# Patient Record
Sex: Female | Born: 1995 | Race: Black or African American | Hispanic: No | Marital: Single | Smoking: Never smoker
Health system: Southern US, Community
[De-identification: ages and names within clinical notes are randomized; demographics above are authoritative.]

## PROBLEM LIST (undated history)

## (undated) ENCOUNTER — Inpatient Hospital Stay (HOSPITAL_COMMUNITY): Payer: Self-pay | Attending: Obstetrics & Gynecology | Admitting: Obstetrics & Gynecology

## (undated) ENCOUNTER — Inpatient Hospital Stay (HOSPITAL_COMMUNITY): Payer: Self-pay

## (undated) ENCOUNTER — Emergency Department: Admission: EM | Disposition: A | Discharge status: Home / Self Care | Payer: Medicaid Other

## (undated) DIAGNOSIS — R519 Headache, unspecified: Secondary | ICD-10-CM

## (undated) DIAGNOSIS — J189 Pneumonia, unspecified organism: Secondary | ICD-10-CM

## (undated) DIAGNOSIS — F329 Major depressive disorder, single episode, unspecified: Secondary | ICD-10-CM

## (undated) DIAGNOSIS — F419 Anxiety disorder, unspecified: Secondary | ICD-10-CM

## (undated) DIAGNOSIS — M549 Dorsalgia, unspecified: Secondary | ICD-10-CM

## (undated) DIAGNOSIS — N390 Urinary tract infection, site not specified: Secondary | ICD-10-CM

## (undated) DIAGNOSIS — G43709 Chronic migraine without aura, not intractable, without status migrainosus: Secondary | ICD-10-CM

## (undated) DIAGNOSIS — O24419 Gestational diabetes mellitus in pregnancy, unspecified control: Secondary | ICD-10-CM

## (undated) DIAGNOSIS — A599 Trichomoniasis, unspecified: Secondary | ICD-10-CM

## (undated) DIAGNOSIS — K5900 Constipation, unspecified: Secondary | ICD-10-CM

## (undated) DIAGNOSIS — K219 Gastro-esophageal reflux disease without esophagitis: Secondary | ICD-10-CM

## (undated) DIAGNOSIS — D649 Anemia, unspecified: Secondary | ICD-10-CM

## (undated) DIAGNOSIS — R51 Headache: Secondary | ICD-10-CM

## (undated) DIAGNOSIS — E669 Obesity, unspecified: Secondary | ICD-10-CM

## (undated) DIAGNOSIS — G40909 Epilepsy, unspecified, not intractable, without status epilepticus: Secondary | ICD-10-CM

## (undated) DIAGNOSIS — F32A Depression, unspecified: Secondary | ICD-10-CM

## (undated) DIAGNOSIS — Z8489 Family history of other specified conditions: Secondary | ICD-10-CM

## (undated) DIAGNOSIS — R569 Unspecified convulsions: Secondary | ICD-10-CM

## (undated) DIAGNOSIS — F909 Attention-deficit hyperactivity disorder, unspecified type: Secondary | ICD-10-CM

## (undated) DIAGNOSIS — A549 Gonococcal infection, unspecified: Secondary | ICD-10-CM

## (undated) DIAGNOSIS — G40409 Other generalized epilepsy and epileptic syndromes, not intractable, without status epilepticus: Secondary | ICD-10-CM

## (undated) HISTORY — PX: ADENOIDECTOMY: SUR15

## (undated) HISTORY — DX: Epilepsy, unspecified, not intractable, without status epilepticus: G40.909

## (undated) HISTORY — DX: Constipation, unspecified: K59.00

## (undated) HISTORY — DX: Dorsalgia, unspecified: M54.9

## (undated) HISTORY — DX: Gestational diabetes mellitus in pregnancy, unspecified control: O24.419

## (undated) HISTORY — PX: EUSTACHIAN TUBE DILATION: SHX6770

## (undated) HISTORY — PX: TONSILLECTOMY: SUR1361

---
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
## 2014-01-06 ENCOUNTER — Ambulatory Visit: Payer: Self-pay

## 2014-01-14 ENCOUNTER — Encounter (HOSPITAL_COMMUNITY): Payer: Self-pay | Admitting: Emergency Medicine

## 2014-01-14 ENCOUNTER — Emergency Department (HOSPITAL_COMMUNITY)
Admission: EM | Admit: 2014-01-14 | Discharge: 2014-01-15 | Disposition: A | Discharge status: Home / Self Care | Payer: 59 | Attending: Emergency Medicine | Admitting: Emergency Medicine

## 2014-01-14 DIAGNOSIS — Z791 Long term (current) use of non-steroidal anti-inflammatories (NSAID): Secondary | ICD-10-CM | POA: Diagnosis not present

## 2014-01-14 DIAGNOSIS — Z87440 Personal history of urinary (tract) infections: Secondary | ICD-10-CM | POA: Insufficient documentation

## 2014-01-14 DIAGNOSIS — R51 Headache: Secondary | ICD-10-CM | POA: Insufficient documentation

## 2014-01-14 DIAGNOSIS — Z880 Allergy status to penicillin: Secondary | ICD-10-CM | POA: Diagnosis not present

## 2014-01-14 DIAGNOSIS — J45909 Unspecified asthma, uncomplicated: Secondary | ICD-10-CM | POA: Insufficient documentation

## 2014-01-14 DIAGNOSIS — Z3202 Encounter for pregnancy test, result negative: Secondary | ICD-10-CM | POA: Diagnosis not present

## 2014-01-14 DIAGNOSIS — R221 Localized swelling, mass and lump, neck: Secondary | ICD-10-CM | POA: Diagnosis not present

## 2014-01-14 DIAGNOSIS — R1030 Lower abdominal pain, unspecified: Secondary | ICD-10-CM | POA: Diagnosis not present

## 2014-01-14 DIAGNOSIS — D649 Anemia, unspecified: Secondary | ICD-10-CM | POA: Diagnosis not present

## 2014-01-14 DIAGNOSIS — R109 Unspecified abdominal pain: Secondary | ICD-10-CM

## 2014-01-14 DIAGNOSIS — Z8659 Personal history of other mental and behavioral disorders: Secondary | ICD-10-CM | POA: Insufficient documentation

## 2014-01-14 DIAGNOSIS — R110 Nausea: Secondary | ICD-10-CM

## 2014-01-14 DIAGNOSIS — E010 Iodine-deficiency related diffuse (endemic) goiter: Secondary | ICD-10-CM | POA: Insufficient documentation

## 2014-01-14 DIAGNOSIS — R42 Dizziness and giddiness: Secondary | ICD-10-CM | POA: Diagnosis not present

## 2014-01-14 DIAGNOSIS — G8929 Other chronic pain: Secondary | ICD-10-CM

## 2014-01-14 LAB — CBC WITH DIFFERENTIAL/PLATELET
BASOS ABS: 0 10*3/uL (ref 0.0–0.1)
BASOS PCT: 0 % (ref 0–1)
Eosinophils Absolute: 0.1 10*3/uL (ref 0.0–0.7)
Eosinophils Relative: 2 % (ref 0–5)
HEMATOCRIT: 35.6 % — AB (ref 36.0–46.0)
Hemoglobin: 11.1 g/dL — ABNORMAL LOW (ref 12.0–15.0)
Lymphocytes Relative: 37 % (ref 12–46)
Lymphs Abs: 2.3 10*3/uL (ref 0.7–4.0)
MCH: 25 pg — AB (ref 26.0–34.0)
MCHC: 31.2 g/dL (ref 30.0–36.0)
MCV: 80.2 fL (ref 78.0–100.0)
MONO ABS: 0.6 10*3/uL (ref 0.1–1.0)
Monocytes Relative: 9 % (ref 3–12)
Neutro Abs: 3.2 10*3/uL (ref 1.7–7.7)
Neutrophils Relative %: 52 % (ref 43–77)
PLATELETS: 341 10*3/uL (ref 150–400)
RBC: 4.44 MIL/uL (ref 3.87–5.11)
RDW: 15.1 % (ref 11.5–15.5)
WBC: 6.2 10*3/uL (ref 4.0–10.5)

## 2014-01-14 LAB — I-STAT TROPONIN, ED: Troponin i, poc: 0 ng/mL (ref 0.00–0.08)

## 2014-01-14 MED ORDER — ONDANSETRON HCL 4 MG/2ML IJ SOLN
4.0000 mg | Freq: Once | INTRAMUSCULAR | Status: AC
  Administered 2014-01-14: 4 mg via INTRAVENOUS
  Filled 2014-01-14: qty 2

## 2014-01-14 MED ORDER — SODIUM CHLORIDE 0.9 % IV BOLUS (SEPSIS)
1000.0000 mL | Freq: Once | INTRAVENOUS | Status: AC
  Administered 2014-01-14: 1000 mL via INTRAVENOUS

## 2014-01-14 MED ORDER — DIPHENHYDRAMINE HCL 50 MG/ML IJ SOLN
25.0000 mg | Freq: Once | INTRAMUSCULAR | Status: AC
  Administered 2014-01-14: 25 mg via INTRAVENOUS
  Filled 2014-01-14: qty 1

## 2014-01-14 NOTE — ED Notes (Signed)
PA-C at bedside 

## 2014-01-14 NOTE — ED Provider Notes (Signed)
CSN: [id number]     Arrival date & time 01/14/14  2154 History   First MD Initiated Contact with Patient 01/14/14 2234     Chief Complaint  Patient presents with  . Edema    The patient said she noticed a swelling in her neck and she did not think it was anything serious.  . Headache     (Consider location/radiation/quality/duration/timing/severity/associated sxs/prior Treatment) HPI Comments: Latoya Stark is a 19 y.o. female with a PMHx of asthma, ADHD, depression, seizures, chronic migraines, and anemia, who presents to the ED with complaints of 4 days of a headache which she describes as similar to her prior migraines, and also reports 2 weeks of "neck swelling" anteriorly which only hurts when she swallows or if it's palpated. She did not think this was serious, and has not had an evaluation for this neck swelling, and came today because of her ongoing headache. She states her head is 9/10 sharp throbbing frontal nonradiating constant pain worse with light and sound, and unrelieved with resting in a dark room, Tylenol, NSAIDs, and Excedrin. She states she has been evaluated for migraines by a neurologist in the past, but does not take any chronic medications to help prevent migraines. She states this headache was a gradual onset headache and is very similar to her prior headaches. Additionally she endorses associated lower abdominal/suprapubic abd cramping and intermittent nausea as well as lightheadedness with standing. As for her neck swelling, she states that when it is palpated it is very tender, and when she swallows it hurts, and she has a globus sensation and slight hoarseness in her voice that she has noticed over the last 2 weeks. Denies fevers, chills, sinus pressure/congestion, rhinorrhea, sore throat, ear pain/drainage, tinnitus, neck pain/stiffness, cough, palpitations, CP, SOB, vomiting, diarrhea, constipation, hematuria, dysuria, vaginal bleeding/discharge, myalgias, arthralgias,  syncope, vision changes, vertigo, focal weakness, numbness, paresthesias, or rashes. Has chronic unchanged L leg weakness which is unchanged. LMP was the beginning of 12/2013 therefore she states she should have had her menses now. +Sexually active. No recent sick contacts or travel.   Patient is a 19 y.o. female presenting with headaches. The history is provided by the patient. No language interpreter was used.  Headache Pain location:  Frontal Quality: throbbing. Radiates to:  Does not radiate Severity currently:  9/10 Severity at highest:  9/10 Onset quality:  Gradual Duration:  4 days Timing:  Constant Progression:  Unchanged Chronicity:  Recurrent Similar to prior headaches: yes   Context: bright light and loud noise   Relieved by:  Nothing Worsened by:  Light and sound Ineffective treatments:  Acetaminophen, NSAIDs and resting in a darkened room Associated symptoms: abdominal pain (suprapubic), nausea, photophobia and swollen glands (anterior neck swelling)   Associated symptoms: no back pain, no blurred vision, no congestion, no cough, no diarrhea, no dizziness, no drainage, no ear pain, no pain, no facial pain, no fatigue, no fever, no focal weakness, no hearing loss, no loss of balance, no myalgias, no near-syncope, no neck pain, no neck stiffness, no numbness, no paresthesias, no sinus pressure, no sore throat, no syncope, no tingling, no URI, no visual change, no vomiting and no weakness     Past Medical History  Diagnosis Date  . Asthma   . ADHD (attention deficit hyperactivity disorder)   . Depression   . Seizures   . Headache   . Anemia   . UTI (lower urinary tract infection)    Past Surgical History  Procedure  Laterality Date  . Orthopedic surgery    . Adenoidectomy    . Dilation and evacuation N/A 08/09/2013    Procedure: DILATATION AND EVACUATION;  Surgeon: Frederico Hamman, MD;  Location: [facility];  Service: Gynecology;  Laterality: N/A;   Family History    Problem Relation Age of Onset  . Seizures Mother   . Diabetes Maternal Grandmother   . Cancer Maternal Grandmother   . Hypertension Maternal Grandmother    History  Substance Use Topics  . Smoking status: Never Smoker   . Smokeless tobacco: Never Used  . Alcohol Use: No   OB History    Gravida Para Term Preterm AB TAB SAB Ectopic Multiple Living   1    1  1         Review of Systems  Constitutional: Negative for fever, chills and fatigue.  HENT: Positive for trouble swallowing (painful) and voice change (hoarseness). Negative for congestion, ear discharge, ear pain, hearing loss, postnasal drip, rhinorrhea, sinus pressure and sore throat.   Eyes: Positive for photophobia. Negative for blurred vision, pain, discharge and visual disturbance.  Respiratory: Negative for cough, shortness of breath and wheezing.   Cardiovascular: Negative for chest pain, palpitations, leg swelling, syncope and near-syncope.  Gastrointestinal: Positive for nausea and abdominal pain (suprapubic). Negative for vomiting, diarrhea, constipation and blood in stool.  Genitourinary: Positive for menstrual problem (missed period). Negative for dysuria, frequency, hematuria, vaginal bleeding and vaginal discharge.  Musculoskeletal: Negative for myalgias, back pain, arthralgias, neck pain and neck stiffness.  Skin: Negative for color change and rash.  Allergic/Immunologic: Negative for immunocompromised state.  Neurological: Positive for light-headedness (with standing) and headaches. Negative for dizziness, focal weakness, weakness, numbness, paresthesias and loss of balance.  Psychiatric/Behavioral: Negative for confusion.   10 Systems reviewed and are negative for acute change except as noted in the HPI.    Allergies  Penicillins and Sulfa antibiotics  Home Medications   Prior to Admission medications   Medication Sig Start Date End Date Taking? Authorizing Provider  fluconazole (DIFLUCAN) 150 MG tablet  Take 1 tablet (150 mg total) by mouth once. 12/27/13   Shelly Bombard, MD  ibuprofen (ADVIL,MOTRIN) 800 MG tablet Take 1 tablet (800 mg total) by mouth 3 (three) times daily. 10/05/13   Olegario Messier, NP  medroxyPROGESTERone (DEPO-PROVERA) 150 MG/ML injection Inject 1 mL (150 mg total) into the muscle every 3 (three) months. 12/23/13   Shelly Bombard, MD  metroNIDAZOLE (FLAGYL) 500 MG tablet Take 4 tablets (2,000 mg total) by mouth once. 12/27/13   Shelly Bombard, MD  Prenat-FeCbn-FeAspGl-FA-Omega (OB COMPLETE PETITE) 35-5-1-200 MG CAPS Take 1 capsule by mouth daily before breakfast. 12/23/13   Shelly Bombard, MD   BP 106/68    Pulse 99  Temp(Src) 98.3 F (36.8 C) (Oral)  Resp 18  Ht 5\' 3"  (1.6 m)  Wt 263 lb (119.296 kg)  BMI 46.60 kg/m2  SpO2 100%  LMP 12/05/2013 Physical Exam  Constitutional: She is oriented to person, place, and time. Vital signs are normal. She appears well-developed and well-nourished.  Non-toxic appearance. No distress.  Afebrile, nontoxic, NAD, VSS  HENT:  Head: Normocephalic and atraumatic.  Right Ear: Hearing, tympanic membrane, external ear and ear canal normal.  Left Ear: Hearing, tympanic membrane, external ear and ear canal normal.  Nose: Right sinus exhibits no maxillary sinus tenderness and no frontal sinus tenderness. Left sinus exhibits maxillary sinus tenderness and frontal sinus tenderness.  Mouth/Throat: Uvula is midline, oropharynx  is clear and moist and mucous membranes are normal. No trismus in the jaw. No uvula swelling.  [place]/AT Ears clear bilaterally Nose clear bilaterally, mild L sided maxillary and frontal sinus TTP Oropharynx clear, patent airway, uvula midline without deviation or swelling, no trismus or drooling, MMM  Eyes: Conjunctivae and EOM are normal. Pupils are equal, round, and reactive to light. Right eye exhibits no discharge. Left eye exhibits no discharge.  PERRL, EOMI without nystagmus  Neck: Normal range of motion and  phonation normal. Neck supple. Tracheal tenderness present. No spinous process tenderness and no muscular tenderness present. No rigidity. No tracheal deviation, no edema, no erythema and normal range of motion present. Thyroid mass present.    +Thyromegaly, L>R, which is TTP No stridor or tracheal deviation FROM intact without spinous process or paraspinous muscle TTP, no bony stepoffs or deformities, no muscle spasms. No rigidity or meningeal signs. No bruising or swelling. No meningismus  Cardiovascular: Normal rate, regular rhythm, normal heart sounds and intact distal pulses.  Exam reveals no gallop and no friction rub.   No murmur heard. RRR, nl s1/s2, no m/r/g, no pedal edema, distal pulses intact  Pulmonary/Chest: Effort normal and breath sounds normal. No stridor. No respiratory distress. She has no decreased breath sounds. She has no wheezes. She has no rhonchi. She has no rales.  Abdominal: Soft. Normal appearance and bowel sounds are normal. She exhibits no distension. There is tenderness in the suprapubic area. There is no rigidity, no rebound, no guarding, no CVA tenderness, no tenderness at McBurney's point and negative Murphy's sign.    Soft, obese which limits exam but nondistended, +BS throughout, with mild TTP in suprapubic area, no r/g/r, neg murphy's, neg mcburney's, no CVA TTP   Musculoskeletal: Normal range of motion.  MAE x4 Strength and sensation at baseline with chronic unchanged L leg weakness All spinal levels nonTTP without deformity or step offs Distal pulses intact, no pedal edema  Neurological: She is alert and oriented to person, place, and time. She has normal strength and normal reflexes. No cranial nerve deficit or sensory deficit. She displays a negative Romberg sign. Coordination and gait normal. GCS eye subscore is 4. GCS verbal subscore is 5. GCS motor subscore is 6.  CN 2-12 grossly intact A&O x4 GCS 15 DTRs symmetric Sensation and strength intact and  at baseline (LLE weakness chronic) Gait nonataxic including with tandem walking Coordination with finger-to-nose and heel-shin WNL Neg romberg, neg pronator drift   Skin: Skin is warm, dry and intact. No rash noted.  Psychiatric: She has a normal mood and affect.  Nursing note and vitals reviewed.   ED Course  Procedures (including critical care time) 23:00 Orthostatic Vital Signs BM  Orthostatic Lying  - BP- Lying: 106/68 mmHg ; Pulse- Lying: 98  Orthostatic Sitting - BP- Sitting: 132/55 mmHg ; Pulse- Sitting: 96  Orthostatic Standing at 0 minutes - BP- Standing at 0 minutes: 122/72 mmHg ; Pulse- Standing at 0 minutes: 106    Labs Review Labs Reviewed  CBC WITH DIFFERENTIAL - Abnormal; Notable for the following:    Hemoglobin 11.1 (*)    HCT 35.6 (*)    MCH 25.0 (*)    All other components within normal limits  COMPREHENSIVE METABOLIC PANEL - Abnormal; Notable for the following:    Glucose, Bld 126 (*)    Total Bilirubin 0.2 (*)    All other components within normal limits  LIPASE, BLOOD  TSH  URINALYSIS, ROUTINE W REFLEX MICROSCOPIC  T4, FREE  POC URINE PREG, ED  I-STAT TROPOININ, ED    Imaging Review No results found.   EKG Interpretation   Date/Time:  Wednesday January 14 2014 23:12:39 EST Ventricular Rate:  94 PR Interval:  171 QRS Duration: 78 QT Interval:  343 QTC Calculation: 429 R Axis:   76 Text Interpretation:  Sinus rhythm Consider left atrial enlargement ST  elev, probable normal early repol pattern No old tracing to compare  Confirmed by Debby Freiberg [phone]) on 01/14/2014 11:25:02 PM      MDM   Final diagnoses:  Chronic nonintractable headache, unspecified headache type  Orthostatic lightheadedness  Localized swelling, mass and lump, neck  Nausea  Abdominal cramping    19 y.o. female with acute on chronic headache, similar to prior HAs. Also having what appears to be thyroid mass vs LAD in anterior neck. Will obtain orthostatics, EKG,  trop, TSH/T4, CBC, CMP, lipase, U/A, upreg, and give benadryl, zofran, and fluids. Will proceed with toradol if pt is not pregnant. No BP noted at this time, will ensure this is recorded. Doubt need for abd imaging but will have more information once we can discern that pt isn't pregnant. Holding off on morphine or toradol until Upreg results. Encouraged pt to give specimen quickly. Will reassess shortly.  1:03 AM Pt delayed getting urine specimen to [place], but Upreg finally resulted with negative. After benadryl and fluids and zofran, nausea improved and HA just slightly improved. Will give toradol now. EKG: NSR with early repol unchanged from prior EKG. Orthostatic VS: from sitting to standing, DBP drop of 43mmHg therefore could be orthostasis. Fluids finished and BP stable. Trop neg. TSH WNL. CBC with baseline anemia. CMP with mild hyperglycemia. Lipase WNL. U/A and T4 pending. Will hand off care to [facility] PA-C to f/up with U/A and ensure headache improves with toradol. Discussed importance of f/up with PCP for anterior neck/thyroid swelling, but Dr. Colin Rhein saw pt with me and agreed that this is not emergently concerning or needing any emergent intervention. Will give list of providers for f/up. Please see Antonietta Breach' dictation for further documentation of care.  BP 123/58 mmHg  Pulse 99  Temp(Src) 98.3 F (36.8 C) (Oral)  Resp 26  Ht 5\' 3"  (1.6 m)  Wt 263 lb (119.296 kg)  BMI 46.60 kg/m2  SpO2 90%  LMP 12/05/2013  Meds ordered this encounter  Medications  . sodium chloride 0.9 % bolus 1,000 mL    Sig:   . ondansetron (ZOFRAN) injection 4 mg    Sig:   . diphenhydrAMINE (BENADRYL) injection 25 mg    Sig:   . ketorolac (TORADOL) 30 MG/ML injection 30 mg    Sig:   . naproxen (NAPROSYN) 500 MG tablet    Sig: Take 1 tablet (500 mg total) by mouth 2 (two) times daily as needed for mild pain, moderate pain or headache (TAKE WITH MEALS.).    Dispense:  20 tablet    Refill:  0    Order  Specific Question:  Supervising Provider    Answer:  Noemi Chapel D [[id number]]  . HYDROcodone-acetaminophen (NORCO) 5-325 MG per tablet    Sig: Take 1-2 tablets by mouth every 6 (six) hours as needed for severe pain.    Dispense:  6 tablet    Refill:  0    Order Specific Question:  Supervising Provider    Answer:  Noemi Chapel D [[id number]]  . ondansetron (ZOFRAN) 8 MG tablet    Sig: Take  1 tablet (8 mg total) by mouth every 8 (eight) hours as needed for nausea or vomiting.    Dispense:  10 tablet    Refill:  0    Order Specific Question:  Supervising Provider    Answer:  Johnna Acosta [address] [place], PA-C 01/15/14 1610  Debby Freiberg, MD 01/16/14 [phone]

## 2014-01-14 NOTE — ED Notes (Addendum)
The patient said she noticed a swelling in her neck and she did not think it was anything serious.  She says it does not hurt unless you touch it.  She also says it is hard for her to swallow.  She is also complaining of a headache she rates it 9/10.  She does have a history of migraines.

## 2014-01-15 DIAGNOSIS — R51 Headache: Secondary | ICD-10-CM | POA: Diagnosis not present

## 2014-01-15 LAB — URINALYSIS, ROUTINE W REFLEX MICROSCOPIC
BILIRUBIN URINE: NEGATIVE
Glucose, UA: NEGATIVE mg/dL
Hgb urine dipstick: NEGATIVE
Ketones, ur: NEGATIVE mg/dL
Leukocytes, UA: NEGATIVE
Nitrite: NEGATIVE
PROTEIN: NEGATIVE mg/dL
SPECIFIC GRAVITY, URINE: 1.017 (ref 1.005–1.030)
UROBILINOGEN UA: 0.2 mg/dL (ref 0.0–1.0)
pH: 7.5 (ref 5.0–8.0)

## 2014-01-15 LAB — COMPREHENSIVE METABOLIC PANEL
ALK PHOS: 86 U/L (ref 39–117)
ALT: 20 U/L (ref 0–35)
AST: 25 U/L (ref 0–37)
Albumin: 3.7 g/dL (ref 3.5–5.2)
Anion gap: 6 (ref 5–15)
BUN: 6 mg/dL (ref 6–23)
CALCIUM: 9.4 mg/dL (ref 8.4–10.5)
CO2: 28 mmol/L (ref 19–32)
Chloride: 105 mEq/L (ref 96–112)
Creatinine, Ser: 0.6 mg/dL (ref 0.50–1.10)
GFR calc non Af Amer: >90 mL/min (ref >90)
GLUCOSE: 126 mg/dL — AB (ref 70–99)
Potassium: 3.5 mmol/L (ref 3.5–5.1)
Sodium: 139 mmol/L (ref 135–145)
Total Bilirubin: 0.2 mg/dL — ABNORMAL LOW (ref 0.3–1.2)
Total Protein: 6.9 g/dL (ref 6.0–8.3)

## 2014-01-15 LAB — TSH: TSH: 3.188 u[IU]/mL (ref 0.350–4.500)

## 2014-01-15 LAB — T4, FREE: Free T4: 0.93 ng/dL (ref 0.80–1.80)

## 2014-01-15 LAB — LIPASE, BLOOD: Lipase: 30 U/L (ref 11–59)

## 2014-01-15 LAB — POC URINE PREG, ED: PREG TEST UR: NEGATIVE

## 2014-01-15 MED ORDER — HYDROCODONE-ACETAMINOPHEN 5-325 MG PO TABS: 1.0000 | ORAL_TABLET | Freq: Four times a day (QID) | ORAL | Status: DC | PRN

## 2014-01-15 MED ORDER — KETOROLAC TROMETHAMINE 30 MG/ML IJ SOLN
30.0000 mg | Freq: Once | INTRAMUSCULAR | Status: AC
  Administered 2014-01-15: 30 mg via INTRAVENOUS
  Filled 2014-01-15: qty 1

## 2014-01-15 MED ORDER — NAPROXEN 500 MG PO TABS: 500.0000 mg | ORAL_TABLET | Freq: Two times a day (BID) | ORAL | Status: DC | PRN

## 2014-01-15 MED ORDER — ONDANSETRON HCL 8 MG PO TABS: 8.0000 mg | ORAL_TABLET | Freq: Three times a day (TID) | ORAL | Status: DC | PRN

## 2014-01-15 NOTE — ED Provider Notes (Signed)
68 - Patient care assumed from [facility], PA-C at shift change. Patient with urinalysis pending. Also pending pain control of headache. Plan discussed with Camprubi-Soms, PA-C which includes discharge if headache improves. Urinalysis is not c/w infection. Patient rechecked; she endorses continued improvement of her headache. She states that she feels comfortable managing her symptoms further at home. Return precautions provided. Patient agreeable to plan with no unaddressed concerns. Patient discharged in good condition; VSS.  Filed Vitals:   01/14/14 2202 01/15/14 0035 01/15/14 0100 01/15/14 0130  BP:  123/58 118/91 110/79  Pulse: 99 99 91 77  Temp: 98.3 F (36.8 C)     TempSrc: Oral     Resp: 18 26 18 24   Height: 5\' 3"  (1.6 m)     Weight: 263 lb (119.296 kg)     SpO2: 100% 90% 100% 96%    Results for orders placed or performed during the hospital encounter of 01/14/14  Urinalysis, Routine w reflex microscopic  Result Value Ref Range   Color, Urine YELLOW YELLOW   APPearance HAZY (A) CLEAR   Specific Gravity, Urine 1.017 1.005 - 1.030   pH 7.5 5.0 - 8.0   Glucose, UA NEGATIVE NEGATIVE mg/dL   Hgb urine dipstick NEGATIVE NEGATIVE   Bilirubin Urine NEGATIVE NEGATIVE   Ketones, ur NEGATIVE NEGATIVE mg/dL   Protein, ur NEGATIVE NEGATIVE mg/dL   Urobilinogen, UA 0.2 0.0 - 1.0 mg/dL   Nitrite NEGATIVE NEGATIVE   Leukocytes, UA NEGATIVE NEGATIVE  CBC with Differential  Result Value Ref Range   WBC 6.2 4.0 - 10.5 K/uL   RBC 4.44 3.87 - 5.11 MIL/uL   Hemoglobin 11.1 (L) 12.0 - 15.0 g/dL   HCT 35.6 (L) 36.0 - 46.0 %   MCV 80.2 78.0 - 100.0 fL   MCH 25.0 (L) 26.0 - 34.0 pg   MCHC 31.2 30.0 - 36.0 g/dL   RDW 15.1 11.5 - 15.5 %   Platelets 341 150 - 400 K/uL   Neutrophils Relative % 52 43 - 77 %   Neutro Abs 3.2 1.7 - 7.7 K/uL   Lymphocytes Relative 37 12 - 46 %   Lymphs Abs 2.3 0.7 - 4.0 K/uL   Monocytes Relative 9 3 - 12 %   Monocytes Absolute 0.6 0.1 - 1.0 K/uL   Eosinophils Relative 2 0 - 5 %   Eosinophils Absolute 0.1 0.0 - 0.7 K/uL   Basophils Relative 0 0 - 1 %   Basophils Absolute 0.0 0.0 - 0.1 K/uL  Comprehensive metabolic panel  Result Value Ref Range   Sodium 139 135 - 145 mmol/L   Potassium 3.5 3.5 - 5.1 mmol/L   Chloride 105 96 - 112 mEq/L   CO2 28 19 - 32 mmol/L   Glucose, Bld 126 (H) 70 - 99 mg/dL   BUN 6 6 - 23 mg/dL   Creatinine, Ser 0.60 0.50 - 1.10 mg/dL   Calcium 9.4 8.4 - 10.5 mg/dL   Total Protein 6.9 6.0 - 8.3 g/dL   Albumin 3.7 3.5 - 5.2 g/dL   AST 25 0 - 37 U/L   ALT 20 0 - 35 U/L   Alkaline Phosphatase 86 39 - 117 U/L   Total Bilirubin 0.2 (L) 0.3 - 1.2 mg/dL   GFR calc non Af Amer >90 >90 mL/min   GFR calc Af Amer >90 >90 mL/min   Anion gap 6 5 - 15  Lipase, blood  Result Value Ref Range   Lipase 30 11 - 59 U/L  TSH  Result Value Ref Range   TSH 3.188 0.350 - 4.500 uIU/mL  POC urine preg, ED (not at [facility])  Result Value Ref Range   Preg Test, Ur NEGATIVE NEGATIVE  I-stat troponin, ED  Result Value Ref Range   Troponin i, poc 0.00 0.00 - 0.08 ng/mL   Comment 3            Antonietta Breach, PA-C 88/89/16 [id number]  Delora Fuel, MD 38/88/28 0034

## 2014-01-15 NOTE — Discharge Instructions (Signed)
The swelling in your neck needs to be reassessed by your regular doctor. If you don't have a regular doctor, use the list below to find one. For your headaches, use naprosyn or norco as directed as needed, but avoid driving while taking these. Use zofran as needed for nausea. Stay well hydrated. Follow up with your regular doctor or neurologist for ongoing headaches. Return to the ER for changes or worsening in symptoms. Abdominal (belly) pain can be caused by many things. Your caregiver performed an examination and possibly ordered blood/urine tests and imaging (CT scan, x-rays, ultrasound). Many cases can be observed and treated at home after initial evaluation in the emergency department. Even though you are being discharged home, abdominal pain can be unpredictable. Therefore, you need a repeated exam if your pain does not resolve, returns, or worsens. Most patients with abdominal pain don't have to be admitted to the hospital or have surgery, but serious problems like appendicitis and gallbladder attacks can start out as nonspecific pain. Many abdominal conditions cannot be diagnosed in one visit, so follow-up evaluations are very important. SEEK IMMEDIATE MEDICAL ATTENTION IF YOU DEVELOP ANY OF THE FOLLOWING SYMPTOMS:  The pain does not go away or becomes severe.   A temperature above 101 develops.   Repeated vomiting occurs (multiple episodes).   The pain becomes localized to portions of the abdomen. The right side could possibly be appendicitis. In an adult, the left lower portion of the abdomen could be colitis or diverticulitis.   Blood is being passed in stools or vomit (bright red or black tarry stools).   Return also if you develop chest pain, difficulty breathing, dizziness or fainting, or become confused, poorly responsive, or inconsolable (young children).  The constipation stays for more than 4 days.   There is belly (abdominal) or rectal pain.   You do not seem to be getting  better.     Migraine Headache A migraine headache is an intense, throbbing pain on one or both sides of your head. A migraine can last for 30 minutes to several hours. CAUSES  The exact cause of a migraine headache is not always known. However, a migraine may be caused when nerves in the brain become irritated and release chemicals that cause inflammation. This causes pain. Certain things may also trigger migraines, such as:  Alcohol.  Smoking.  Stress.  Menstruation.  Aged cheeses.  Foods or drinks that contain nitrates, glutamate, aspartame, or tyramine.  Lack of sleep.  Chocolate.  Caffeine.  Hunger.  Physical exertion.  Fatigue.  Medicines used to treat chest pain (nitroglycerine), birth control pills, estrogen, and some blood pressure medicines. SIGNS AND SYMPTOMS  Pain on one or both sides of your head.  Pulsating or throbbing pain.  Severe pain that prevents daily activities.  Pain that is aggravated by any physical activity.  Nausea, vomiting, or both.  Dizziness.  Pain with exposure to bright lights, loud noises, or activity.  General sensitivity to bright lights, loud noises, or smells. Before you get a migraine, you may get warning signs that a migraine is coming (aura). An aura may include:  Seeing flashing lights.  Seeing bright spots, halos, or zigzag lines.  Having tunnel vision or blurred vision.  Having feelings of numbness or tingling.  Having trouble talking.  Having muscle weakness. DIAGNOSIS  A migraine headache is often diagnosed based on:  Symptoms.  Physical exam.  A CT scan or MRI of your head. These imaging tests cannot diagnose migraines, but  they can help rule out other causes of headaches. TREATMENT Medicines may be given for pain and nausea. Medicines can also be given to help prevent recurrent migraines.  HOME CARE INSTRUCTIONS  Only take over-the-counter or prescription medicines for pain or discomfort as  directed by your health care provider. The use of long-term narcotics is not recommended.  Lie down in a dark, quiet room when you have a migraine.  Keep a journal to find out what may trigger your migraine headaches. For example, write down:  What you eat and drink.  How much sleep you get.  Any change to your diet or medicines.  Limit alcohol consumption.  Quit smoking if you smoke.  Get 7-9 hours of sleep, or as recommended by your health care provider.  Limit stress.  Keep lights dim if bright lights bother you and make your migraines worse. SEEK IMMEDIATE MEDICAL CARE IF:   Your migraine becomes severe.  You have a fever.  You have a stiff neck.  You have vision loss.  You have muscular weakness or loss of muscle control.  You start losing your balance or have trouble walking.  You feel faint or pass out.  You have severe symptoms that are different from your first symptoms. MAKE SURE YOU:   Understand these instructions.  Will watch your condition.  Will get help right away if you are not doing well or get worse. Document Released: 12/26/2004 Document Revised: 05/12/2013 Document Reviewed: 09/02/2012 [facility] Patient Information 2015 [place], [place]. This information is not intended to replace advice given to you by your health care provider. Make sure you discuss any questions you have with your health care provider.  Nausea, Adult Nausea is the feeling that you have an upset stomach or have to vomit. Nausea by itself is not likely a serious concern, but it may be an early sign of more serious medical problems. As nausea gets worse, it can lead to vomiting. If vomiting develops, there is the risk of dehydration.  CAUSES   Viral infections.  Food poisoning.  Medicines.  Pregnancy.  Motion sickness.  Migraine headaches.  Emotional distress.  Severe pain from any source.  Alcohol intoxication. HOME CARE INSTRUCTIONS  Get plenty of  rest.  Ask your caregiver about specific rehydration instructions.  Eat small amounts of food and sip liquids more often.  Take all medicines as told by your caregiver. SEEK MEDICAL CARE IF:  You have not improved after 2 days, or you get worse.  You have a headache. SEEK IMMEDIATE MEDICAL CARE IF:   You have a fever.  You faint.  You keep vomiting or have blood in your vomit.  You are extremely weak or dehydrated.  You have dark or bloody stools.  You have severe chest or abdominal pain. MAKE SURE YOU:  Understand these instructions.  Will watch your condition.  Will get help right away if you are not doing well or get worse. Document Released: 02/03/2004 Document Revised: 09/20/2011 Document Reviewed: 09/07/2010 [facility] Patient Information 2015 [place], [place]. This information is not intended to replace advice given to you by your health care provider. Make sure you discuss any questions you have with your health care provider. Emergency Department Resource Guide 1) Find a Doctor and Pay Out of Pocket Although you won't have to find out who is covered by your insurance plan, it is a good idea to ask around and get recommendations. You will then need to call the office and see if the  doctor you have chosen will accept you as a new patient and what types of options they offer for patients who are self-pay. Some doctors offer discounts or will set up payment plans for their patients who do not have insurance, but you will need to ask so you aren't surprised when you get to your appointment.  2) Contact Your Local Health Department Not all health departments have doctors that can see patients for sick visits, but many do, so it is worth a call to see if yours does. If you don't know where your local health department is, you can check in your phone book. The CDC also has a tool to help you locate your state's health department, and many state websites also have listings of all  of their local health departments.  3) Find a [facility] If your illness is not likely to be very severe or complicated, you may want to try a walk in clinic. These are popping up all over the country in pharmacies, drugstores, and shopping centers. They're usually staffed by nurse practitioners or physician assistants that have been trained to treat common illnesses and complaints. They're usually fairly quick and inexpensive. However, if you have serious medical issues or chronic medical problems, these are probably not your best option.  No Primary Care Doctor: - Call Health Connect at  [phone] - they can help you locate a primary care doctor that  accepts your insurance, provides certain services, etc. - Physician Referral Service- [phone]  Chronic Pain Problems: Organization         Address  Phone   Notes  [facility]  [phone] Patients need to be referred by their primary care doctor.   Medication Assistance: Organization         Address  Phone   Notes  [facility] Medication [facility] [address] [place], [place] [address] [phone] --Must be a resident of [facility] -- Must have NO insurance coverage whatsoever (no Medicaid/ Medicare, etc.) -- The pt. MUST have a primary care doctor that directs their care regularly and follows them in the community   [facility]  [phone]   [employer]  [phone]    Agencies that provide inexpensive medical care: Organization         Address  Phone   Notes  [place]  [phone]   [facility]    [phone]   [facility] [place],  [address] [phone]   [facility] [address] [place] [phone]   [facility]    [phone]   [facility]    [phone]   [place] and [place] [address],  [place] Phone:  [phone], Fax:  [phone] Hours of Operation:  9 am - 6 pm, M-F.  Also accepts Medicaid/Medicare and self-pay.  [facility] for [place] [address], [address], [place] Phone: [phone], Fax: [phone]. Hours of Operation:  8:30 am - 5:30 pm, M-F.  Also accepts Medicaid and self-pay.  [facility] [place] [address], [place] Phone: [phone]   [place], [place], [place] [phone], Ext. [phone] Mondays & Thursdays: 7-9 AM.  First 15 patients are seen on a first come, first serve basis.    [place] Providers:  Organization  Address  Phone   Notes  [facility] [address], [place] [phone] Also accepts self-pay patients.  [facility] [address], [place]  [phone]   [place], [address], [place] [phone]   [facility] [address], [place] [phone]   Lucianne Lei [address], [place]   [phone] Only accepts [place] patients after they have their name applied to their card.   Self-Pay (no insurance) in [facility]:  Organization         Address  Phone   Notes  Sickle Cell Patients, [facility] [place] [phone]   [facility] [place] [phone]   [facility] [place]  [place], [place], [place] [phone]   [facility]/Dr. Osei-Bonsu  [address], [place] or [address], [place] [phone] Phone number for both [place] and [place] locations is the same.  Urgent Medical and [facility] [address] [place] [phone]   [facility] [address] [place] or [address] [phone]   [facility] [address] [place] [phone], phone; [phone], fax Sees patients 1st and 3rd Saturday of every month.  Must not qualify for public or private insurance (i.e. Medicaid, Medicare, [place] Health Choice, Veterans' Benefits)  Household income should be no more than 200% of the poverty level The clinic cannot treat you if you are pregnant or think you are pregnant  Sexually transmitted diseases are not treated at the clinic.

## 2014-02-15 ENCOUNTER — Encounter (HOSPITAL_COMMUNITY): Payer: Self-pay | Admitting: Family Medicine

## 2014-02-15 ENCOUNTER — Emergency Department (HOSPITAL_COMMUNITY)
Admission: EM | Admit: 2014-02-15 | Discharge: 2014-02-15 | Disposition: A | Discharge status: Home / Self Care | Payer: 59 | Attending: Emergency Medicine | Admitting: Emergency Medicine

## 2014-02-15 DIAGNOSIS — Z8669 Personal history of other diseases of the nervous system and sense organs: Secondary | ICD-10-CM | POA: Insufficient documentation

## 2014-02-15 DIAGNOSIS — E860 Dehydration: Secondary | ICD-10-CM | POA: Diagnosis not present

## 2014-02-15 DIAGNOSIS — Z79899 Other long term (current) drug therapy: Secondary | ICD-10-CM | POA: Insufficient documentation

## 2014-02-15 DIAGNOSIS — Z87440 Personal history of urinary (tract) infections: Secondary | ICD-10-CM | POA: Diagnosis not present

## 2014-02-15 DIAGNOSIS — J45909 Unspecified asthma, uncomplicated: Secondary | ICD-10-CM | POA: Insufficient documentation

## 2014-02-15 DIAGNOSIS — Z3202 Encounter for pregnancy test, result negative: Secondary | ICD-10-CM | POA: Diagnosis not present

## 2014-02-15 DIAGNOSIS — Z880 Allergy status to penicillin: Secondary | ICD-10-CM | POA: Insufficient documentation

## 2014-02-15 DIAGNOSIS — R339 Retention of urine, unspecified: Secondary | ICD-10-CM | POA: Diagnosis present

## 2014-02-15 DIAGNOSIS — Z862 Personal history of diseases of the blood and blood-forming organs and certain disorders involving the immune mechanism: Secondary | ICD-10-CM | POA: Diagnosis not present

## 2014-02-15 DIAGNOSIS — Z791 Long term (current) use of non-steroidal anti-inflammatories (NSAID): Secondary | ICD-10-CM | POA: Insufficient documentation

## 2014-02-15 DIAGNOSIS — Z8659 Personal history of other mental and behavioral disorders: Secondary | ICD-10-CM | POA: Insufficient documentation

## 2014-02-15 LAB — URINALYSIS, ROUTINE W REFLEX MICROSCOPIC
Bilirubin Urine: NEGATIVE
Glucose, UA: NEGATIVE mg/dL
Ketones, ur: NEGATIVE mg/dL
Leukocytes, UA: NEGATIVE
Nitrite: NEGATIVE
PROTEIN: NEGATIVE mg/dL
Specific Gravity, Urine: 1.025 (ref 1.005–1.030)
Urobilinogen, UA: 0.2 mg/dL (ref 0.0–1.0)
pH: 6.5 (ref 5.0–8.0)

## 2014-02-15 LAB — CBC WITH DIFFERENTIAL/PLATELET
BASOS ABS: 0 10*3/uL (ref 0.0–0.1)
BASOS PCT: 0 % (ref 0–1)
Eosinophils Absolute: 0.1 10*3/uL (ref 0.0–0.7)
Eosinophils Relative: 3 % (ref 0–5)
HEMATOCRIT: 35.6 % — AB (ref 36.0–46.0)
Hemoglobin: 11.4 g/dL — ABNORMAL LOW (ref 12.0–15.0)
Lymphocytes Relative: 34 % (ref 12–46)
Lymphs Abs: 1.6 10*3/uL (ref 0.7–4.0)
MCH: 26.2 pg (ref 26.0–34.0)
MCHC: 32 g/dL (ref 30.0–36.0)
MCV: 81.8 fL (ref 78.0–100.0)
MONOS PCT: 8 % (ref 3–12)
Monocytes Absolute: 0.4 10*3/uL (ref 0.1–1.0)
NEUTROS PCT: 55 % (ref 43–77)
Neutro Abs: 2.6 10*3/uL (ref 1.7–7.7)
Platelets: 338 10*3/uL (ref 150–400)
RBC: 4.35 MIL/uL (ref 3.87–5.11)
RDW: 14.8 % (ref 11.5–15.5)
WBC: 4.8 10*3/uL (ref 4.0–10.5)

## 2014-02-15 LAB — BASIC METABOLIC PANEL
ANION GAP: 6 (ref 5–15)
BUN: 10 mg/dL (ref 6–23)
CHLORIDE: 104 mmol/L (ref 96–112)
CO2: 28 mmol/L (ref 19–32)
Calcium: 8.9 mg/dL (ref 8.4–10.5)
Creatinine, Ser: 0.97 mg/dL (ref 0.50–1.10)
GFR calc Af Amer: >90 mL/min (ref >90)
GFR calc non Af Amer: 84 mL/min — ABNORMAL LOW (ref >90)
GLUCOSE: 91 mg/dL (ref 70–99)
POTASSIUM: 3.8 mmol/L (ref 3.5–5.1)
Sodium: 138 mmol/L (ref 135–145)

## 2014-02-15 LAB — URINE MICROSCOPIC-ADD ON

## 2014-02-15 LAB — POC URINE PREG, ED: PREG TEST UR: NEGATIVE

## 2014-02-15 NOTE — Discharge Instructions (Signed)
Rehydration, Adult Rehydration is the replacement of body fluids lost during dehydration. Dehydration is an extreme loss of body fluids to the point of body function impairment. There are many ways extreme fluid loss can occur, including vomiting, diarrhea, or excess sweating. Recovering from dehydration requires replacing lost fluids, continuing to eat to maintain strength, and avoiding foods and beverages that may contribute to further fluid loss or may increase nausea. HOW TO REHYDRATE In most cases, rehydration involves the replacement of not only fluids but also carbohydrates and basic body salts. Rehydration with an oral rehydration solution is one way to replace essential nutrients lost through dehydration. An oral rehydration solution can be purchased at pharmacies, retail stores, and online. Premixed packets of powder that you combine with water to make a solution are also sold. You can prepare an oral rehydration solution at home by mixing the following ingredients together:    - tsp table salt.   tsp baking soda.   tsp salt substitute containing potassium chloride.  1 tablespoons sugar.  1 L (34 oz) of water. Be sure to use exact measurements. Including too much sugar can make diarrhea worse. Drink -1 cup (120-240 mL) of oral rehydration solution each time you have diarrhea or vomit. If drinking this amount makes your vomiting worse, try drinking smaller amounts more often. For example, drink 1-3 tsp every 5-10 minutes.  A general rule for staying hydrated is to drink 1-2 L of fluid per day. Talk to your caregiver about the specific amount you should be drinking each day. Drink enough fluids to keep your urine clear or pale yellow. EATING WHEN DEHYDRATED Even if you have had severe sweating or you are having diarrhea, do not stop eating. Many healthy items in a normal diet are okay to continue eating while recovering from dehydration. The following tips can help you to lessen nausea  when you eat:  Ask someone else to prepare your food. Cooking smells may worsen nausea.  Eat in a well-ventilated room away from cooking smells.  Sit up when you eat. Avoid lying down until 1-2 hours after eating.  Eat small amounts when you eat.  Eat foods that are easy to digest. These include soft, well-cooked, or mashed foods. FOODS AND BEVERAGES TO AVOID Avoid eating or drinking the following foods and beverages that may increase nausea or further loss of fluid:   Fruit juices with a high sugar content, such as concentrated juices.  Alcohol.  Beverages containing caffeine.  Carbonated drinks. They may cause a lot of gas.  Foods that may cause a lot of gas, such as cabbage, broccoli, and beans.  Fatty, greasy, and fried foods.  Spicy, very salty, and very sweet foods or drinks.  Foods or drinks that are very hot or very cold. Consume food or drinks at or near room temperature.  Foods that need a lot of chewing, such as raw vegetables.  Foods that are sticky or hard to swallow, such as peanut butter. Document Released: 03/20/2011 Document Revised: 09/20/2011 Document Reviewed: 03/20/2011 [facility] Patient Information 2015 [place], [place]. This information is not intended to replace advice given to you by your health care provider. Make sure you discuss any questions you have with your health care provider.

## 2014-02-15 NOTE — ED Notes (Signed)
Per pt sts that she has been unable to void. sts last time she voided was last night around 7 pm. sts she has been drinking a lot of fluids. Denies burning or pain.

## 2014-02-15 NOTE — ED Provider Notes (Signed)
CSN: [id number]     Arrival date & time 02/15/14  3893 History   First MD Initiated Contact with Patient 02/15/14 1011     Chief Complaint  Patient presents with  . Urinary Retention     (Consider location/radiation/quality/duration/timing/severity/associated sxs/prior Treatment) HPI   PCP: Raechel Ache, MD Blood pressure 93/59, pulse 89, temperature 97.4 F (36.3 C), temperature source Oral, resp. rate 16, last menstrual period 02/11/2014, SpO2 99 %.  Latoya Stark is a 19 y.o.female with a significant PMH of depression, asthma, seizures, headache, anemia, UTI  presents to the ER with complaints of  Urinary retention and being unable to void since last night around 7 pm. She reports drinking significant amount of water and has still not needed to urine. On arrival the nurse reports bladder scanner shows 160 ml of urine in bladder. The patient was then able to clean cath provide urine sample in the ED. She endorses some suprapubic discomfort. No nausea, vomiting, diarrhea, weakness, fevers.   Past Medical History  Diagnosis Date  . Asthma   . ADHD (attention deficit hyperactivity disorder)   . Depression   . Seizures   . Headache   . Anemia   . UTI (lower urinary tract infection)    Past Surgical History  Procedure Laterality Date  . Orthopedic surgery    . Adenoidectomy    . Dilation and evacuation N/A 08/09/2013    Procedure: DILATATION AND EVACUATION;  Surgeon: Frederico Hamman, MD;  Location: [facility];  Service: Gynecology;  Laterality: N/A;   Family History  Problem Relation Age of Onset  . Seizures Mother   . Diabetes Maternal Grandmother   . Cancer Maternal Grandmother   . Hypertension Maternal Grandmother    History  Substance Use Topics  . Smoking status: Never Smoker   . Smokeless tobacco: Never Used  . Alcohol Use: No   OB History    Gravida Para Term Preterm AB TAB SAB Ectopic Multiple Living   1    1  1         Review of Systems  10 Systems  reviewed and are negative for acute change except as noted in the HPI.    Allergies  Penicillins; Cheese; and Sulfa antibiotics  Home Medications   Prior to Admission medications   Medication Sig Start Date End Date Taking? Authorizing Provider  HYDROcodone-acetaminophen (NORCO) 5-325 MG per tablet Take 1-2 tablets by mouth every 6 (six) hours as needed for severe pain. 01/15/14  Yes Mercedes Strupp Camprubi-Soms, PA-C  ibuprofen (ADVIL,MOTRIN) 800 MG tablet Take 1 tablet (800 mg total) by mouth 3 (three) times daily. 10/05/13  Yes Olegario Messier, NP  naproxen (NAPROSYN) 500 MG tablet Take 1 tablet (500 mg total) by mouth 2 (two) times daily as needed for mild pain, moderate pain or headache (TAKE WITH MEALS.). 01/15/14  Yes Mercedes Strupp Camprubi-Soms, PA-C  Prenat-FeCbn-FeAspGl-FA-Omega (OB COMPLETE PETITE) 35-5-1-200 MG CAPS Take 1 capsule by mouth daily before breakfast. 12/23/13  Yes Shelly Bombard, MD  fluconazole (DIFLUCAN) 150 MG tablet Take 1 tablet (150 mg total) by mouth once. Patient not taking: Reported on 01/14/2014 12/27/13   Shelly Bombard, MD  medroxyPROGESTERone (DEPO-PROVERA) 150 MG/ML injection Inject 1 mL (150 mg total) into the muscle every 3 (three) months. Patient not taking: Reported on 01/14/2014 12/23/13   Shelly Bombard, MD  metroNIDAZOLE (FLAGYL) 500 MG tablet Take 4 tablets (2,000 mg total) by mouth once. Patient not taking: Reported on 01/14/2014 12/27/13  Shelly Bombard, MD  ondansetron (ZOFRAN) 8 MG tablet Take 1 tablet (8 mg total) by mouth every 8 (eight) hours as needed for nausea or vomiting. 01/15/14   Mercedes Strupp Camprubi-Soms, PA-C   BP 93/59 mmHg  Pulse 89  Temp(Src) 97.4 F (36.3 C) (Oral)  Resp 16  SpO2 99%  LMP 02/11/2014 Physical Exam  Constitutional: She appears well-developed and well-nourished. No distress.  HENT:  Head: Normocephalic and atraumatic.  Eyes: Pupils are equal, round, and reactive to light.  Neck: Normal range of  motion. Neck supple.  Cardiovascular: Normal rate and regular rhythm.   Pulmonary/Chest: Effort normal.  Abdominal: Soft. There is tenderness.  Neurological: She is alert.  Skin: Skin is warm and dry.  Nursing note and vitals reviewed.   ED Course  Procedures (including critical care time) Labs Review Labs Reviewed  URINALYSIS, ROUTINE W REFLEX MICROSCOPIC - Abnormal; Notable for the following:    APPearance CLOUDY (*)    Hgb urine dipstick MODERATE (*)    All other components within normal limits  CBC WITH DIFFERENTIAL/PLATELET - Abnormal; Notable for the following:    Hemoglobin 11.4 (*)    HCT 35.6 (*)    All other components within normal limits  BASIC METABOLIC PANEL - Abnormal; Notable for the following:    GFR calc non Af Amer 84 (*)    All other components within normal limits  URINE MICROSCOPIC-ADD ON - Abnormal; Notable for the following:    Squamous Epithelial / LPF FEW (*)    Bacteria, UA FEW (*)    All other components within normal limits  POC URINE PREG, ED    Imaging Review No results found.   EKG Interpretation None      MDM   Final diagnoses:  Dehydration   Normal CBC and BMP, urinalysis and urine preg are reassuring- hemoblogin in urine as pt just completed a menstrual cycle.  19 y.o.Latoya Stark's evaluation in the Emergency Department is complete. It has been determined that no acute conditions requiring further emergency intervention are present at this time. The patient/guardian have been advised of the diagnosis and plan. We have discussed signs and symptoms that warrant return to the ED, such as changes or worsening in symptoms.  Vital signs are stable at discharge. Filed Vitals:   02/15/14 1115  BP: 129/77  Pulse: 80  Temp:   Resp:     Patient/guardian has voiced understanding and agreed to follow-up with the PCP or specialist.     Linus Mako, PA-C 02/15/14 [place], MD 02/15/14 [phone]

## 2014-02-24 ENCOUNTER — Telehealth: Payer: Self-pay | Admitting: *Deleted

## 2014-02-24 ENCOUNTER — Ambulatory Visit (INDEPENDENT_AMBULATORY_CARE_PROVIDER_SITE_OTHER): Payer: Self-pay | Admitting: Internal Medicine

## 2014-02-24 ENCOUNTER — Encounter: Payer: Self-pay | Admitting: Internal Medicine

## 2014-02-24 ENCOUNTER — Other Ambulatory Visit (INDEPENDENT_AMBULATORY_CARE_PROVIDER_SITE_OTHER): Payer: Medicaid Other

## 2014-02-24 VITALS — BP 82/60 | HR 88 | Ht 64.5 in | Wt 271.2 lb

## 2014-02-24 DIAGNOSIS — K5901 Slow transit constipation: Secondary | ICD-10-CM

## 2014-02-24 DIAGNOSIS — R1084 Generalized abdominal pain: Secondary | ICD-10-CM | POA: Diagnosis not present

## 2014-02-24 DIAGNOSIS — K50919 Crohn's disease, unspecified, with unspecified complications: Secondary | ICD-10-CM | POA: Diagnosis not present

## 2014-02-24 DIAGNOSIS — K5900 Constipation, unspecified: Secondary | ICD-10-CM | POA: Insufficient documentation

## 2014-02-24 DIAGNOSIS — K5909 Other constipation: Secondary | ICD-10-CM | POA: Insufficient documentation

## 2014-02-24 LAB — IBC PANEL
Iron: 115 ug/dL (ref 42–145)
SATURATION RATIOS: 24.5 % (ref 20.0–50.0)
TRANSFERRIN: 335 mg/dL (ref 212.0–360.0)

## 2014-02-24 LAB — TSH: TSH: 2.06 u[IU]/mL (ref 0.40–5.00)

## 2014-02-24 LAB — SEDIMENTATION RATE: SED RATE: 10 mm/h (ref 0–22)

## 2014-02-24 LAB — VITAMIN B12: Vitamin B-12: 309 pg/mL (ref 211–911)

## 2014-02-24 LAB — HCG, QUANTITATIVE, PREGNANCY: QUANTITATIVE HCG: 0 m[IU]/mL

## 2014-02-24 MED ORDER — LINACLOTIDE 145 MCG PO CAPS: 145.0000 ug | ORAL_CAPSULE | Freq: Every day | ORAL | Status: DC

## 2014-02-24 NOTE — Telephone Encounter (Signed)
Unable to reach patient at either number listed. Mail box is full.

## 2014-02-24 NOTE — Telephone Encounter (Signed)
Patient came for OV.

## 2014-02-24 NOTE — Progress Notes (Signed)
Latoya Stark 03-07-1995 [id number]  Note: This dictation was prepared with Dragon digital system. Any transcriptional errors that result from this procedure are unintentional.   History of Present Illness: This is a 19 year old African-American female with  severe chronic constipation. She reports having bowel movements only when she has menses once a month .She denies any rectal bleeding but  has occasional lower abdominal pain for which she has been evaluated in the emergency room multiple times. There is no family history of inflammatory bowel disease. Her mother and her brother both have similar problems. She has  poor eating habits skipping breakfast and lunch then having snacks in the afternoon and one meal at home in the evening. She sometimes eats late at night at 10 PM and goes to bed at midnight. She often has to get up at 4 in the morning to go to work. Her weight has been excessive but stable. She has history of mild anemia. She has history of depression ,asthma, seizures and urinary tract infections. She has tried her brother's MiraLAX but did not like it. She has taken prune juice. She takes Naprosyn, Zofran and birth control pill . She had a D&C in July 2015 for early pregnancy    Past Medical History  Diagnosis Date  . Asthma   . ADHD (attention deficit hyperactivity disorder)   . Depression   . Seizures   . Headache   . Anemia   . UTI (lower urinary tract infection)     Past Surgical History  Procedure Laterality Date  . Orthopedic surgery Left     left hand  . Adenoidectomy    . Dilation and evacuation N/A 08/09/2013    Procedure: DILATATION AND EVACUATION;  Surgeon: Frederico Hamman, MD;  Location: [facility];  Service: Gynecology;  Laterality: N/A;    Allergies  Allergen Reactions  . Milk-Related Compounds Anaphylaxis    Breaks face out  . Penicillins Anaphylaxis    Throat swells.   [name] Other (See Comments)    Breaks face out  . Sulfa Antibiotics Swelling     Family history and social history have been reviewed.  Review of Systems: Positive for constipation. Negative for nausea vomiting heartburn  The remainder of the 10 point ROS is negative except as outlined in the H&P  Physical Exam: General Appearance Well developed, in no distress, obese Eyes  Non icteric  HEENT  Non traumatic, normocephalic  Mouth No lesion, tongue papillated, no cheilosis Neck Supple without adenopathy, thyroid not enlarged, no carotid bruits, no JVD Lungs Clear to auscultation bilaterally COR Normal S1, normal S2, regular rhythm, no murmur, quiet precordium Abdomen protuberant. Soft. Normoactive bowel sounds. Minimal tenderness in left lower quadrant. Rectal normal rectal sphincter tone. Small amount of Hemoccult negative stool in the ampulla. No perianal disease Extremities  No pedal edema Skin No lesions Neurological Alert and oriented x 3 Psychological Normal mood and affect, cooperative and pleasant  Assessment and Plan:   19 year old African-American female with  severe constipation without actually having any discomfort in her abdomen. She thinks that her bowel habits are normal for her while her family urges her to go to the doctor to be evaluated. She has tried some laxatives without success. She has very poor eating habits not conducive to normal bowel function. We will start Linzess 145 ug daily and milk of magnesia 15 cc every day when she comes back from work. We will give her high fiber diet. We will proceed with CT  scan of the abdomen and pelvis with IV and oral contrast to rule out partial small bowel obstruction, megacolon or Crohn's disease. She may need Sitzmarks study to assess transit time. and colonoscopy, eventually. But first  she needs to  improve her eating habits and increase the fiber intake. We will be checking a sedimentation rate, sprue profile, TSH and IgA     Delfin Edis 02/24/2014

## 2014-02-24 NOTE — Telephone Encounter (Signed)
-----   Message from Lafayette Dragon, MD sent at 02/23/2014  5:13 PM EST ----- Hello Rollene Fare, this  19 yo lady is a high risk for No show- ED referral, frequent flier, depression, head aches, on Norco. Please call is she is planning to come.Risa Grill

## 2014-02-24 NOTE — Patient Instructions (Addendum)
Your physician has requested that you go to the basement for the following lab work before leaving today: B12, iron studies, sed rate, TSH, sprue profile, PG test   Today you have been given a handout to read on constipation.  We have sent the following medications to your pharmacy for you to pick up at your convenience: Linzess , coupon provided  Take over the counter milk of magnesia 2 tablespoons daily at 4:00pm.   You have been scheduled for a CT scan of the abdomen and pelvis at [facility] ([address]---this is in the same building as [facility]).   You are scheduled on 03/06/14 at 3:30. You should arrive 15 minutes prior to your appointment time for registration. Please follow the written instructions below on the day of your exam:  WARNING: IF YOU ARE ALLERGIC TO IODINE/X-RAY DYE, PLEASE NOTIFY RADIOLOGY IMMEDIATELY AT [phone]! YOU WILL BE GIVEN A 13 HOUR PREMEDICATION PREP.  1) Do not eat or drink anything after 11:30 (4 hours prior to your test) 2) You have been given 2 bottles of oral contrast to drink. The solution may taste better if refrigerated, but do NOT add ice or any other liquid to this solution. Shake well before drinking.    Drink 1 bottle of contrast @ 1:30 (2 hours prior to your exam)  Drink 1 bottle of contrast @ 2:30 (1 hour prior to your exam)  You may take any medications as prescribed with a small amount of water except for the following: Metformin, Glucophage, Glucovance, Avandamet, Riomet, Fortamet, Actoplus Met, Janumet, Glumetza or Metaglip. The above medications must be held the day of the exam AND 48 hours after the exam.  The purpose of you drinking the oral contrast is to aid in the visualization of your intestinal tract. The contrast solution may cause some diarrhea. Before your exam is started, you will be given a small amount of fluid to drink. Depending on your individual set of symptoms, you may also receive an  intravenous injection of x-ray contrast/dye. Plan on being at [facility] for 30 minutes or long, depending on the type of exam you are having performed.  This test typically takes 30-45 minutes to complete.  If you have any questions regarding your exam or if you need to reschedule, you may call the CT department at [phone] between the hours of 8:00 am and 5:00 pm, Monday-Friday.  ________________________________________________________________________  I appreciate the opportunity to care for you. Dr Baltazar Najjar

## 2014-02-25 LAB — GLIA (IGA/G) + TTG IGA
GLIADIN IGG: 3 U (ref <20)
Gliadin IgA: 6 Units (ref <20)
Tissue Transglutaminase Ab, IgA: 1 U/mL (ref <4)

## 2014-02-27 ENCOUNTER — Encounter: Payer: Self-pay | Admitting: *Deleted

## 2014-03-06 ENCOUNTER — Inpatient Hospital Stay: Admission: RE | Admit: 2014-03-06 | Payer: 59 | Source: Ambulatory Visit

## 2014-03-09 ENCOUNTER — Telehealth: Payer: Self-pay | Admitting: Internal Medicine

## 2014-03-09 ENCOUNTER — Inpatient Hospital Stay: Admission: RE | Admit: 2014-03-09 | Payer: 59 | Source: Ambulatory Visit

## 2014-03-09 ENCOUNTER — Telehealth: Payer: Self-pay | Admitting: *Deleted

## 2014-03-09 MED ORDER — ONDANSETRON HCL 4 MG PO TABS: ORAL_TABLET | ORAL | Status: DC

## 2014-03-09 NOTE — Telephone Encounter (Signed)
Patient states she drank CT contrast on Friday but was unable to have the CT. She is scheduled for CT again today. She reports she has been vomiting since Friday. She is asking for something for nausea and vomiting. Dr. Olevia Perches is off today. DOD - Dr. Ardis Hughs. Please, advise.

## 2014-03-09 NOTE — Telephone Encounter (Signed)
Received a call from [facility]. Patient called them Friday and told them she had car trouble. She rescheduled for today. She called them today and told them she was having nausea and vomiting. Patient did get rx for Zofran. [facility] called patient when she did not show up this afternoon and she is not returning their calls.

## 2014-03-09 NOTE — Telephone Encounter (Signed)
Patient states she drank CT contrast on Friday but was unable to have CT scan. She is scheduled for CT again today. She states she has been vomiting since Friday when she drank the contrast. She is asking for something for nausea, vomiting.

## 2014-03-09 NOTE — Telephone Encounter (Signed)
Ok, zofran 4mg  pill, take one pill twice daily for nausea.  Disp 60 with no refills.  Thanks

## 2014-03-09 NOTE — Telephone Encounter (Signed)
Spoke with patient gave her recommendation. Rx sent to pharmacy.

## 2014-03-10 ENCOUNTER — Other Ambulatory Visit: Payer: Self-pay | Admitting: *Deleted

## 2014-03-10 DIAGNOSIS — K5901 Slow transit constipation: Secondary | ICD-10-CM

## 2014-03-10 NOTE — Telephone Encounter (Signed)
May be she is afraid of the cost. Another option is to do a [employer] study for colonic transit time. Call her to schedule in place of CT sczan.

## 2014-03-10 NOTE — Telephone Encounter (Signed)
Spoke with patient and she states she will do a sitz marker study. She will stop her Linzess tomorrow and Thursday. She will come on Friday to take the pill. She will have the xray on 03/17/14.

## 2014-03-16 ENCOUNTER — Telehealth: Payer: Self-pay | Admitting: *Deleted

## 2014-03-16 NOTE — Telephone Encounter (Signed)
Home number not in service and cell number mail box is full. Will try calling patient again later because she has not had her xray today.

## 2014-03-17 NOTE — Telephone Encounter (Signed)
Mail box is full unable to reach patient.

## 2014-03-18 NOTE — Telephone Encounter (Signed)
Patient came on 03/12/14 and swallowed the sitz marker capsule. She was to come back for xray on 03/17/14. She did not show. I have been unable to reach patient at any of the number in her chart.

## 2014-03-18 NOTE — Telephone Encounter (Signed)
Sorry to hear that. She did not show up for CT scan either.If we cannot reach her, she will have to call us if she needs [place].

## 2014-03-22 ENCOUNTER — Inpatient Hospital Stay (HOSPITAL_COMMUNITY)
Admission: AD | Admit: 2014-03-22 | Discharge: 2014-03-22 | Disposition: A | Discharge status: Home / Self Care | Payer: 59 | Source: Ambulatory Visit | Attending: Obstetrics | Admitting: Obstetrics

## 2014-03-22 ENCOUNTER — Encounter (HOSPITAL_COMMUNITY): Payer: Self-pay

## 2014-03-22 DIAGNOSIS — K5909 Other constipation: Secondary | ICD-10-CM

## 2014-03-22 DIAGNOSIS — N939 Abnormal uterine and vaginal bleeding, unspecified: Secondary | ICD-10-CM | POA: Diagnosis present

## 2014-03-22 DIAGNOSIS — R110 Nausea: Secondary | ICD-10-CM | POA: Diagnosis not present

## 2014-03-22 DIAGNOSIS — K5900 Constipation, unspecified: Secondary | ICD-10-CM | POA: Diagnosis not present

## 2014-03-22 DIAGNOSIS — N920 Excessive and frequent menstruation with regular cycle: Secondary | ICD-10-CM | POA: Diagnosis not present

## 2014-03-22 HISTORY — DX: Anxiety disorder, unspecified: F41.9

## 2014-03-22 LAB — CBC
HCT: 35.1 % — ABNORMAL LOW (ref 36.0–46.0)
HEMOGLOBIN: 11 g/dL — AB (ref 12.0–15.0)
MCH: 26.3 pg (ref 26.0–34.0)
MCHC: 31.3 g/dL (ref 30.0–36.0)
MCV: 83.8 fL (ref 78.0–100.0)
Platelets: 350 10*3/uL (ref 150–400)
RBC: 4.19 MIL/uL (ref 3.87–5.11)
RDW: 14.1 % (ref 11.5–15.5)
WBC: 4.9 10*3/uL (ref 4.0–10.5)

## 2014-03-22 LAB — COMPREHENSIVE METABOLIC PANEL
ALT: 18 U/L (ref 0–35)
AST: 22 U/L (ref 0–37)
Albumin: 3.8 g/dL (ref 3.5–5.2)
Alkaline Phosphatase: 81 U/L (ref 39–117)
Anion gap: 7 (ref 5–15)
BUN: 11 mg/dL (ref 6–23)
CO2: 28 mmol/L (ref 19–32)
Calcium: 9.2 mg/dL (ref 8.4–10.5)
Chloride: 104 mmol/L (ref 96–112)
Creatinine, Ser: 0.66 mg/dL (ref 0.50–1.10)
GFR calc non Af Amer: >90 mL/min (ref >90)
Glucose, Bld: 109 mg/dL — ABNORMAL HIGH (ref 70–99)
POTASSIUM: 4 mmol/L (ref 3.5–5.1)
Sodium: 139 mmol/L (ref 135–145)
Total Bilirubin: 0.2 mg/dL — ABNORMAL LOW (ref 0.3–1.2)
Total Protein: 7.1 g/dL (ref 6.0–8.3)

## 2014-03-22 LAB — URINE MICROSCOPIC-ADD ON

## 2014-03-22 LAB — URINALYSIS, ROUTINE W REFLEX MICROSCOPIC
BILIRUBIN URINE: NEGATIVE
Glucose, UA: NEGATIVE mg/dL
Ketones, ur: NEGATIVE mg/dL
Leukocytes, UA: NEGATIVE
Nitrite: NEGATIVE
Protein, ur: NEGATIVE mg/dL
Specific Gravity, Urine: 1.025 (ref 1.005–1.030)
UROBILINOGEN UA: 0.2 mg/dL (ref 0.0–1.0)
pH: 6.5 (ref 5.0–8.0)

## 2014-03-22 LAB — WET PREP, GENITAL
TRICH WET PREP: NONE SEEN
YEAST WET PREP: NONE SEEN

## 2014-03-22 LAB — POCT PREGNANCY, URINE: PREG TEST UR: NEGATIVE

## 2014-03-22 MED ORDER — FAMOTIDINE 20 MG PO TABS
20.0000 mg | ORAL_TABLET | Freq: Once | ORAL | Status: AC
  Administered 2014-03-22: 20 mg via ORAL
  Filled 2014-03-22: qty 1

## 2014-03-22 MED ORDER — PROMETHAZINE HCL 25 MG PO TABS
25.0000 mg | ORAL_TABLET | Freq: Once | ORAL | Status: AC
  Administered 2014-03-22: 25 mg via ORAL
  Filled 2014-03-22: qty 1

## 2014-03-22 MED ORDER — PROMETHAZINE HCL 25 MG PO TABS: 12.5000 mg | ORAL_TABLET | Freq: Four times a day (QID) | ORAL | Status: DC | PRN

## 2014-03-22 MED ORDER — MEDROXYPROGESTERONE ACETATE 150 MG/ML IM SUSP
150.0000 mg | Freq: Once | INTRAMUSCULAR | Status: AC
  Administered 2014-03-22: 150 mg via INTRAMUSCULAR
  Filled 2014-03-22: qty 1

## 2014-03-22 NOTE — Discharge Instructions (Signed)
Menorrhagia  Menorrhagia is the medical term for when your menstrual periods are heavy or last longer than usual. With menorrhagia, every period you have may cause enough blood loss and cramping that you are unable to maintain your usual activities.  CAUSES   In some cases, the cause of heavy periods is unknown, but a number of conditions may cause menorrhagia. Common causes include:   A problem with the hormone-producing thyroid gland (hypothyroid).   Noncancerous growths in the uterus (polyps or fibroids).   An imbalance of the estrogen and progesterone hormones.   One of your ovaries not releasing an egg during one or more months.   Side effects of having an intrauterine device (IUD).   Side effects of some medicines, such as anti-inflammatory medicines or blood thinners.   A bleeding disorder that stops your blood from clotting normally.  SIGNS AND SYMPTOMS   During a normal period, bleeding lasts between 4 and 8 days. Signs that your periods are too heavy include:   You routinely have to change your pad or tampon every 1 or 2 hours because it is completely soaked.   You pass blood clots larger than 1 inch (2.5 cm) in size.   You have bleeding for more than 7 days.   You need to use pads and tampons at the same time because of heavy bleeding.   You need to wake up to change your pads or tampons during the night.   You have symptoms of anemia, such as tiredness, fatigue, or shortness of breath.  DIAGNOSIS   Your health care provider will perform a physical exam and ask you questions about your symptoms and menstrual history. Other tests may be ordered based on what the health care provider finds during the exam. These tests can include:   Blood tests. Blood tests are used to check if you are pregnant or have hormonal changes, a bleeding or thyroid disorder, low iron levels (anemia), or other problems.   Endometrial biopsy. Your health care provider takes a sample of tissue from the inside of your  uterus to be examined under a microscope.   Pelvic ultrasound. This test uses sound waves to make a picture of your uterus, ovaries, and vagina. The pictures can show if you have fibroids or other growths.   Hysteroscopy. For this test, your health care provider will use a small telescope to look inside your uterus.  Based on the results of your initial tests, your health care provider may recommend further testing.  TREATMENT   Treatment may not be needed. If it is needed, your health care provider may recommend treatment with one or more medicines first. If these do not reduce bleeding enough, a surgical treatment might be an option. The best treatment for you will depend on:    Whether you need to prevent pregnancy.   Your desire to have children in the future.   The cause and severity of your bleeding.   Your opinion and personal preference.   Medicines for menorrhagia may include:   Birth control methods that use hormones. These include the pill, skin patch, vaginal ring, shots that you get every 3 months, hormonal IUD, and implant. These treatments reduce bleeding during your menstrual period.   Medicines that thicken blood and slow bleeding.   Medicines that reduce swelling, such as ibuprofen.   Medicines that contain a synthetic hormone called progestin.    Medicines that make the ovaries stop working for a short time.     the lining of your uterus to reduce menstrual bleeding.  Operative hysteroscopy. This procedure uses a tiny tube with a light (hysteroscope) to view your uterine cavity and can help in the surgical removal of a polyp that may be causing heavy periods.  Endometrial ablation. Through various techniques, your health care  provider permanently destroys the entire lining of your uterus (endometrium). After endometrial ablation, most women have little or no menstrual flow. Endometrial ablation reduces your ability to become pregnant.  Endometrial resection. This surgical procedure uses an electrosurgical wire loop to remove the lining of the uterus. This procedure also reduces your ability to become pregnant.  Hysterectomy. Surgical removal of the uterus and cervix is a permanent procedure that stops menstrual periods. Pregnancy is not possible after a hysterectomy. This procedure requires anesthesia and hospitalization. HOME CARE INSTRUCTIONS   Only take over-the-counter or prescription medicines as directed by your health care provider. Take prescribed medicines exactly as directed. Do not change or switch medicines without consulting your health care provider.  Take any prescribed iron pills exactly as directed by your health care provider. Long-term heavy bleeding may result in low iron levels. Iron pills help replace the iron your body lost from heavy bleeding. Iron may cause constipation. If this becomes a problem, increase the bran, fruits, and roughage in your diet.  Do not take aspirin or medicines that contain aspirin 1 week before or during your menstrual period. Aspirin may make the bleeding worse.  If you need to change your sanitary pad or tampon more than once every 2 hours, stay in bed and rest as much as possible until the bleeding stops.  Eat well-balanced meals. Eat foods high in iron. Examples are leafy green vegetables, meat, liver, eggs, and whole grain breads and cereals. Do not try to lose weight until the abnormal bleeding has stopped and your blood iron level is back to normal. SEEK MEDICAL CARE IF:   You soak through a pad or tampon every 1 or 2 hours, and this happens every time you have a period.  You need to use pads and tampons at the same time because you are bleeding so much.  You  need to change your pad or tampon during the night.  You have a period that lasts for more than 8 days.  You pass clots bigger than 1 inch wide.  You have irregular periods that happen more or less often than once a month.  You feel dizzy or faint.  You feel very weak or tired.  You feel short of breath or feel your heart is beating too fast when you exercise.  You have nausea and vomiting or diarrhea while you are taking your medicine.  You have any problems that may be related to the medicine you are taking. SEEK IMMEDIATE MEDICAL CARE IF:   You soak through 4 or more pads or tampons in 2 hours.  You have any bleeding while you are pregnant. MAKE SURE YOU:   Understand these instructions.  Will watch your condition.  Will get help right away if you are not doing well or get worse. Document Released: 12/26/2004 Document Revised: 12/31/2012 Document Reviewed: 06/16/2012 [facility] Patient Information 2015 [place], [place]. This information is not intended to replace advice given to you by your health care provider. Make sure you discuss any questions you have with your health care provider.  High-Fiber Diet Fiber is found in fruits, vegetables, and grains. A high-fiber diet encourages the addition of more whole grains,  legumes, fruits, and vegetables in your diet. The recommended amount of fiber for adult males is 38 g per day. For adult females, it is 25 g per day. Pregnant and lactating women should get 28 g of fiber per day. If you have a digestive or bowel problem, ask your caregiver for advice before adding high-fiber foods to your diet. Eat a variety of high-fiber foods instead of only a select few type of foods.  PURPOSE  To increase stool bulk.  To make bowel movements more regular to prevent constipation.  To lower cholesterol.  To prevent overeating. WHEN IS THIS DIET USED?  It may be used if you have constipation and hemorrhoids.  It may be used if you have  uncomplicated diverticulosis (intestine condition) and irritable bowel syndrome.  It may be used if you need help with weight management.  It may be used if you want to add it to your diet as a protective measure against atherosclerosis, diabetes, and cancer. SOURCES OF FIBER  Whole-grain breads and cereals.  Fruits, such as apples, oranges, bananas, berries, prunes, and pears.  Vegetables, such as green peas, carrots, sweet potatoes, beets, broccoli, cabbage, spinach, and artichokes.  Legumes, such split peas, soy, lentils.  Almonds. FIBER CONTENT IN FOODS Starches and Grains / Dietary Fiber (g)  Raisin Bran, 1 cup / 7g  Cheerios, 1 cup / 3 g  Corn Flakes cereal, 1 cup / 0.7 g  Rice crispy treat cereal, 1 cup / 0.3 g  Instant oatmeal (cooked),  cup / 2 g  Frosted wheat cereal, 1 cup / 5.1 g  Brown, long-grain rice (cooked), 1 cup / 3.5 g  White, long-grain rice (cooked), 1 cup / 0.6 g  Enriched macaroni (cooked), 1 cup / 2.5 g Legumes / Dietary Fiber (g)  Baked beans (canned, plain, or vegetarian),  cup / 5.2 g  Kidney beans (canned),  cup / 6.8 g  Pinto beans (cooked),  cup / 5.5 g Breads and Crackers / Dietary Fiber (g)  Plain or honey graham crackers, 2 squares / 0.7 g  Saltine crackers, 3 squares / 0.3 g  Plain, salted pretzels, 10 pieces / 1.8 g  Whole-wheat bread, 1 slice / 1.9 g  White bread, 1 slice / 0.7 g  Raisin bread, 1 slice / 1.2 g  Plain bagel, 3 oz / 2 g  Flour tortilla, 1 oz / 0.9 g  Corn tortilla, 1 small / 1.5 g  Hamburger or hotdog bun, 1 small / 0.9 g Fruits / Dietary Fiber (g)  Apple with skin, 1 medium / 4.4 g  Sweetened applesauce,  cup / 1.5 g  Banana,  medium / 1.5 g  Grapes, 10 grapes / 0.4 g  Orange, 1 small / 2.3 g  Raisin, 1.5 oz / 1.6 g  Melon, 1 cup / 1.4 g Vegetables / Dietary Fiber (g)  Green beans (canned),  cup / 1.3 g  Carrots (cooked),  cup / 2.3 g  Broccoli (cooked),  cup / 2.8  g  Peas (cooked),  cup / 4.4 g  Mashed potatoes,  cup / 1.6 g  Lettuce, 1 cup / 0.5 g  Corn (canned),  cup / 1.6 g  Tomato,  cup / 1.1 g Document Released: 12/26/2004 Document Revised: 06/27/2011 Document Reviewed: 03/30/2011 [employer] Patient Information 2015 [place], [place]. This information is not intended to replace advice given to you by your health care provider. Make sure you discuss any questions you have with your health care  provider.

## 2014-03-22 NOTE — MAU Provider Note (Signed)
Chief Complaint: Vaginal Bleeding   First Provider Initiated Contact with Patient 03/22/14 1240      SUBJECTIVE HPI: Latoya Stark is a 19 y.o. G1P0010 who presents to maternity admissions reporting vaginal bleeding x 1 month, reported as light some days, heavier other days.  She also reports daily nausea, with occasional vomiting x 2 weeks.  She had recent work up by GI for constipation/nausea and has missed appointments for follow up.  She initially denies constipation but then reports she has not had bowel movement in 1.5 weeks.   She was prescribed high fiber diet but has not made any changes to her current diet.  She has mostly regular periods, and reports she has never had bleeding last this long.  She sees Dr Jodi Mourning for gyn care and reports she has cancelled recent appts with him due to family/home issues and she has not gotten her Depo Provera injection.  She denies abdominal pain, vaginal itching/burning, urinary symptoms, h/a, dizziness, n/v, or fever/chills.    Past Medical History  Diagnosis Date  . Asthma   . ADHD (attention deficit hyperactivity disorder)   . Depression   . Seizures   . Headache   . Anemia   . UTI (lower urinary tract infection)   . Anxiety    Past Surgical History  Procedure Laterality Date  . Orthopedic surgery Left     left hand  . Adenoidectomy    . Dilation and evacuation N/A 08/09/2013    Procedure: DILATATION AND EVACUATION;  Surgeon: Frederico Hamman, MD;  Location: [facility];  Service: Gynecology;  Laterality: N/A;   History   Social History  . Marital Status: Single    Spouse Name: N/A  . Number of Children: 0  . Years of Education: N/A   Occupational History  . Not on file.   Social History Main Topics  . Smoking status: Never Smoker   . Smokeless tobacco: Never Used  . Alcohol Use: No  . Drug Use: No  . Sexual Activity: Yes    Birth Control/ Protection: Condom     Comment: last intercourse Nov 6th; new sex partner over the past  week   Other Topics Concern  . Not on file   Social History Narrative   No current facility-administered medications on file prior to encounter.   Current Outpatient Prescriptions on File Prior to Encounter  Medication Sig Dispense Refill  . Linaclotide (LINZESS) 145 MCG CAPS capsule Take 1 capsule (145 mcg total) by mouth daily. 30 capsule 3  . medroxyPROGESTERone (DEPO-PROVERA) 150 MG/ML injection Inject 1 mL (150 mg total) into the muscle every 3 (three) months. (Patient not taking: Reported on 01/14/2014) 1 mL 0  . naproxen (NAPROSYN) 500 MG tablet Take 1 tablet (500 mg total) by mouth 2 (two) times daily as needed for mild pain, moderate pain or headache (TAKE WITH MEALS.). (Patient not taking: Reported on 02/24/2014) 20 tablet 0  . ondansetron (ZOFRAN) 4 MG tablet Take one po BID for nausea 60 tablet 0  . ondansetron (ZOFRAN) 8 MG tablet Take 1 tablet (8 mg total) by mouth every 8 (eight) hours as needed for nausea or vomiting. (Patient not taking: Reported on 02/24/2014) 10 tablet 0  . Prenat-FeCbn-FeAspGl-FA-Omega (OB COMPLETE PETITE) 35-5-1-200 MG CAPS Take 1 capsule by mouth daily before breakfast. 30 capsule 11   Allergies  Allergen Reactions  . Milk-Related Compounds Anaphylaxis    Breaks face out  . Penicillins Anaphylaxis    Throat swells.   Marland Kitchen  Cheese Other (See Comments)    Breaks face out  . Sulfa Antibiotics Swelling    ROS: Pertinent items in HPI  OBJECTIVE Blood pressure 114/66, pulse 94, temperature 98.4 F (36.9 C), temperature source Oral, resp. rate 16, height 5\' 3"  (1.6 m), weight 212 lb (96.163 kg), last menstrual period 02/24/2014. GENERAL: Well-developed, well-nourished female in no acute distress.  HEENT: Normocephalic HEART: normal rate RESP: normal effort ABDOMEN: Soft, non-tender EXTREMITIES: Nontender, no edema NEURO: Alert and oriented Pelvic exam: Cervix pink, visually closed, without lesion, moderate amount dark red bleeding, vaginal walls and  external genitalia normal Bimanual exam: Cervix 0/long/high, firm, anterior, neg CMT, uterus nontender, nonenlarged, adnexa without tenderness, enlargement, or mass  LAB RESULTS Results for orders placed or performed during the hospital encounter of 03/22/14 (from the past 24 hour(s))  Urinalysis, Routine w reflex microscopic     Status: Abnormal   Collection Time: 03/22/14 12:00 PM  Result Value Ref Range   Color, Urine YELLOW YELLOW   APPearance CLEAR CLEAR   Specific Gravity, Urine 1.025 1.005 - 1.030   pH 6.5 5.0 - 8.0   Glucose, UA NEGATIVE NEGATIVE mg/dL   Hgb urine dipstick LARGE (A) NEGATIVE   Bilirubin Urine NEGATIVE NEGATIVE   Ketones, ur NEGATIVE NEGATIVE mg/dL   Protein, ur NEGATIVE NEGATIVE mg/dL   Urobilinogen, UA 0.2 0.0 - 1.0 mg/dL   Nitrite NEGATIVE NEGATIVE   Leukocytes, UA NEGATIVE NEGATIVE  Urine microscopic-add on     Status: Abnormal   Collection Time: 03/22/14 12:00 PM  Result Value Ref Range   Squamous Epithelial / LPF FEW (A) RARE   WBC, UA 0-2 <3 WBC/hpf   RBC / HPF 7-10 <3 RBC/hpf  Pregnancy, urine POC     Status: None   Collection Time: 03/22/14 12:25 PM  Result Value Ref Range   Preg Test, Ur NEGATIVE NEGATIVE  Wet prep, genital     Status: Abnormal   Collection Time: 03/22/14 12:45 PM  Result Value Ref Range   Yeast Wet Prep HPF POC NONE SEEN NONE SEEN   Trich, Wet Prep NONE SEEN NONE SEEN   Clue Cells Wet Prep HPF POC FEW (A) NONE SEEN   WBC, Wet Prep HPF POC FEW (A) NONE SEEN  CBC     Status: Abnormal   Collection Time: 03/22/14 12:58 PM  Result Value Ref Range   WBC 4.9 4.0 - 10.5 K/uL   RBC 4.19 3.87 - 5.11 MIL/uL   Hemoglobin 11.0 (L) 12.0 - 15.0 g/dL   HCT 35.1 (L) 36.0 - 46.0 %   MCV 83.8 78.0 - 100.0 fL   MCH 26.3 26.0 - 34.0 pg   MCHC 31.3 30.0 - 36.0 g/dL   RDW 14.1 11.5 - 15.5 %   Platelets 350 150 - 400 K/uL  Comprehensive metabolic panel     Status: Abnormal   Collection Time: 03/22/14 12:58 PM  Result Value Ref Range    Sodium 139 135 - 145 mmol/L   Potassium 4.0 3.5 - 5.1 mmol/L   Chloride 104 96 - 112 mmol/L   CO2 28 19 - 32 mmol/L   Glucose, Bld 109 (H) 70 - 99 mg/dL   BUN 11 6 - 23 mg/dL   Creatinine, Ser 0.66 0.50 - 1.10 mg/dL   Calcium 9.2 8.4 - 10.5 mg/dL   Total Protein 7.1 6.0 - 8.3 g/dL   Albumin 3.8 3.5 - 5.2 g/dL   AST 22 0 - 37 U/L   ALT 18 0 -  35 U/L   Alkaline Phosphatase 81 39 - 117 U/L   Total Bilirubin 0.2 (L) 0.3 - 1.2 mg/dL   GFR calc non Af Amer >90 >90 mL/min   GFR calc Af Amer >90 >90 mL/min   Anion gap 7 5 - 15    ASSESSMENT 1. Menorrhagia with regular cycle   2. Chronic constipation   3. Nausea without vomiting     PLAN Phenergan 25 mg PO and Pepcid 20 mg PO given in MAU Depo Provera injection given today in MAU Discharge home F/U with GI for constipation and nausea Reviewed high fiber diet with pt F/U with Dr Jodi Mourning for Gyn care and contraception Return to MAU as needed    Medication List    STOP taking these medications        naproxen 500 MG tablet  Commonly known as:  NAPROSYN     ondansetron 4 MG tablet  Commonly known as:  ZOFRAN     ondansetron 8 MG tablet  Commonly known as:  ZOFRAN      TAKE these medications        Linaclotide 145 MCG Caps capsule  Commonly known as:  LINZESS  Take 1 capsule (145 mcg total) by mouth daily.     medroxyPROGESTERone 150 MG/ML injection  Commonly known as:  DEPO-PROVERA  Inject 1 mL (150 mg total) into the muscle every 3 (three) months.     OB COMPLETE PETITE 35-5-1-200 MG Caps  Take 1 capsule by mouth daily before breakfast.     promethazine 25 MG tablet  Commonly known as:  PHENERGAN  Take 0.5-1 tablets (12.5-25 mg total) by mouth every 6 (six) hours as needed.       Follow-up Information    Please follow up.   Why:  Gastroenterology for nausea/constipation      Follow up with HARPER,CHARLES A, MD.   Specialty:  Obstetrics and Gynecology   Why:  For Gyn care and contraception, As needed    Contact information:   [address] [place] [address] [phone]       Follow up with [facility].   Why:  As needed for emergencies   Contact information:   [address] [id number] [place] Certified Nurse-Midwife 03/22/2014  2:10 PM

## 2014-03-22 NOTE — MAU Note (Signed)
Pt states here for vaginal bleeding x1 month. Denies hx of ovarian cysts or fibroids. Goes from super light to super heavy. Has changed pad twice today. No abnormal vaginal discharge.

## 2014-03-23 LAB — HIV ANTIBODY (ROUTINE TESTING W REFLEX): HIV SCREEN 4TH GENERATION: NONREACTIVE

## 2014-03-23 LAB — GC/CHLAMYDIA PROBE AMP (~~LOC~~) NOT AT ARMC
CHLAMYDIA, DNA PROBE: NEGATIVE
NEISSERIA GONORRHEA: NEGATIVE

## 2014-03-30 ENCOUNTER — Telehealth: Payer: Self-pay | Admitting: *Deleted

## 2014-03-30 NOTE — Telephone Encounter (Signed)
Patient called stating that she was seen at the ED for white substance leaking from breast. Patient states UPT was preformed at the hospital and the results were negative. Patient states she has taken 2 UPT's since then and the results were positive. Patient state she is still having the leakage along with nausea and fatigue. Patient schedule for a problem visit tomorrow morning at 11:45 with a UPT.

## 2014-03-31 ENCOUNTER — Ambulatory Visit: Payer: 59 | Admitting: Obstetrics

## 2014-04-25 ENCOUNTER — Encounter (HOSPITAL_COMMUNITY): Payer: Self-pay | Admitting: *Deleted

## 2014-04-25 ENCOUNTER — Inpatient Hospital Stay (HOSPITAL_COMMUNITY)
Admission: AD | Admit: 2014-04-25 | Discharge: 2014-04-25 | Disposition: A | Discharge status: Home / Self Care | Payer: 59 | Source: Ambulatory Visit | Attending: Obstetrics | Admitting: Obstetrics

## 2014-04-25 DIAGNOSIS — R109 Unspecified abdominal pain: Secondary | ICD-10-CM | POA: Diagnosis not present

## 2014-04-25 DIAGNOSIS — R51 Headache: Secondary | ICD-10-CM | POA: Insufficient documentation

## 2014-04-25 DIAGNOSIS — N939 Abnormal uterine and vaginal bleeding, unspecified: Secondary | ICD-10-CM | POA: Diagnosis present

## 2014-04-25 LAB — URINALYSIS, ROUTINE W REFLEX MICROSCOPIC
Bilirubin Urine: NEGATIVE
Glucose, UA: NEGATIVE mg/dL
Ketones, ur: NEGATIVE mg/dL
Leukocytes, UA: NEGATIVE
Nitrite: NEGATIVE
PH: 6 (ref 5.0–8.0)
Protein, ur: 30 mg/dL — AB
Urobilinogen, UA: 0.2 mg/dL (ref 0.0–1.0)

## 2014-04-25 LAB — CBC
HCT: 36.7 % (ref 36.0–46.0)
HEMOGLOBIN: 11.8 g/dL — AB (ref 12.0–15.0)
MCH: 27.4 pg (ref 26.0–34.0)
MCHC: 32.2 g/dL (ref 30.0–36.0)
MCV: 85.3 fL (ref 78.0–100.0)
Platelets: 354 10*3/uL (ref 150–400)
RBC: 4.3 MIL/uL (ref 3.87–5.11)
RDW: 14 % (ref 11.5–15.5)
WBC: 6.8 10*3/uL (ref 4.0–10.5)

## 2014-04-25 LAB — MRSA PCR SCREENING: MRSA by PCR: NEGATIVE

## 2014-04-25 LAB — URINE MICROSCOPIC-ADD ON

## 2014-04-25 LAB — POCT PREGNANCY, URINE: Preg Test, Ur: NEGATIVE

## 2014-04-25 MED ORDER — IBUPROFEN 800 MG PO TABS
800.0000 mg | ORAL_TABLET | Freq: Once | ORAL | Status: AC
  Administered 2014-04-25: 800 mg via ORAL
  Filled 2014-04-25: qty 1

## 2014-04-25 NOTE — MAU Provider Note (Signed)
History     CSN: [id number]  Arrival date and time: 04/25/14 1947   First Provider Initiated Contact with Patient 04/25/14 2051      Chief Complaint  Patient presents with  . Vaginal Bleeding   HPI Latoya Stark 19 y.o. G1P0010 nonpregnant female presents to MAU complaining of heavy vaginal bleeding x 3 weeks.  It has been very heavy since his am with large blood clots running down her leg.  She complains of headache and belly pain but no fever.  THe pain is described as menstrual cramps, 8/10.  She keeps her phone at her ear the entire visit.  She states she has not had sex since last here and tested for STDs.  She denies vaginal discharge, CP, SOB.   OB History    Gravida Para Term Preterm AB TAB SAB Ectopic Multiple Living   1    1  1          Past Medical History  Diagnosis Date  . Asthma   . ADHD (attention deficit hyperactivity disorder)   . Depression   . Seizures   . Headache   . Anemia   . UTI (lower urinary tract infection)   . Anxiety     Past Surgical History  Procedure Laterality Date  . Orthopedic surgery Left     left hand  . Adenoidectomy    . Dilation and evacuation N/A 08/09/2013    Procedure: DILATATION AND EVACUATION;  Surgeon: Frederico Hamman, MD;  Location: [facility];  Service: Gynecology;  Laterality: N/A;    Family History  Problem Relation Age of Onset  . Seizures Mother   . Diabetes Maternal Grandmother   . Colon cancer Maternal Grandmother   . Hypertension Maternal Grandmother   . Clotting disorder Maternal Grandmother     History  Substance Use Topics  . Smoking status: Never Smoker   . Smokeless tobacco: Never Used  . Alcohol Use: No    Allergies:  Allergies  Allergen Reactions  . Milk-Related Compounds Anaphylaxis    Breaks face out  . Penicillins Anaphylaxis    Throat swells.   [name] Other (See Comments)    Breaks face out  . Sulfa Antibiotics Swelling    Prescriptions prior to admission  Medication Sig Dispense  Refill Last Dose  . Prenat-FeCbn-FeAspGl-FA-Omega (OB COMPLETE PETITE) 35-5-1-200 MG CAPS Take 1 capsule by mouth daily before breakfast. 30 capsule 11 04/24/2014 at Unknown time  . promethazine (PHENERGAN) 25 MG tablet Take 0.5-1 tablets (12.5-25 mg total) by mouth every 6 (six) hours as needed. 30 tablet 0 Past Week at Unknown time  . Linaclotide (LINZESS) 145 MCG CAPS capsule Take 1 capsule (145 mcg total) by mouth daily. (Patient not taking: Reported on 04/25/2014) 30 capsule 3   . medroxyPROGESTERone (DEPO-PROVERA) 150 MG/ML injection Inject 1 mL (150 mg total) into the muscle every 3 (three) months. (Patient not taking: Reported on 01/14/2014) 1 mL 0 Not Taking    ROS Pertinent ROS in HPI  Physical Exam   Blood pressure 122/75, pulse 96, temperature 98 F (36.7 C), height 5\' 4"  (1.626 m), weight 269 lb 12.8 oz (122.38 kg), SpO2 99 %.  Physical Exam  Constitutional: She is oriented to person, place, and time. She appears well-developed and well-nourished. No distress.  HENT:  Head: Normocephalic and atraumatic.  Eyes: EOM are normal.  Neck: Normal range of motion.  Cardiovascular: Normal rate and regular rhythm.   Respiratory: Effort normal and breath sounds normal.  No respiratory distress.  GI: Soft. Bowel sounds are normal. She exhibits no distension. There is no tenderness. There is no rebound and no guarding.  Genitourinary:  Mod amt of dark red blood in vagina No CMT.  No adnexal mass or tenderness appreciated  Musculoskeletal: Normal range of motion.  Neurological: She is alert and oriented to person, place, and time.  Skin: Skin is warm and dry.  Psychiatric: She has a normal mood and affect.   Results for orders placed or performed during the hospital encounter of 04/25/14 (from the past 24 hour(s))  Urinalysis, Routine w reflex microscopic     Status: Abnormal   Collection Time: 04/25/14  8:10 PM  Result Value Ref Range   Color, Urine RED (A) YELLOW   APPearance TURBID  (A) CLEAR   Specific Gravity, Urine >1.030 (H) 1.005 - 1.030   pH 6.0 5.0 - 8.0   Glucose, UA NEGATIVE NEGATIVE mg/dL   Hgb urine dipstick LARGE (A) NEGATIVE   Bilirubin Urine NEGATIVE NEGATIVE   Ketones, ur NEGATIVE NEGATIVE mg/dL   Protein, ur 30 (A) NEGATIVE mg/dL   Urobilinogen, UA 0.2 0.0 - 1.0 mg/dL   Nitrite NEGATIVE NEGATIVE   Leukocytes, UA NEGATIVE NEGATIVE  Urine microscopic-add on     Status: Abnormal   Collection Time: 04/25/14  8:10 PM  Result Value Ref Range   Squamous Epithelial / LPF FEW (A) RARE   WBC, UA 0-2 <3 WBC/hpf   RBC / HPF TOO NUMEROUS TO COUNT <3 RBC/hpf   Bacteria, UA FEW (A) RARE   Urine-Other MUCOUS PRESENT   Pregnancy, urine POC     Status: None   Collection Time: 04/25/14  8:18 PM  Result Value Ref Range   Preg Test, Ur NEGATIVE NEGATIVE  CBC     Status: Abnormal   Collection Time: 04/25/14  9:10 PM  Result Value Ref Range   WBC 6.8 4.0 - 10.5 K/uL   RBC 4.30 3.87 - 5.11 MIL/uL   Hemoglobin 11.8 (L) 12.0 - 15.0 g/dL   HCT 36.7 36.0 - 46.0 %   MCV 85.3 78.0 - 100.0 fL   MCH 27.4 26.0 - 34.0 pg   MCHC 32.2 30.0 - 36.0 g/dL   RDW 14.0 11.5 - 15.5 %   Platelets 354 150 - 400 K/uL  MRSA PCR Screening     Status: None   Collection Time: 04/25/14  9:40 PM  Result Value Ref Range   MRSA by PCR NEGATIVE NEGATIVE    MAU Course  Procedures  MDM H/H is stable No sign of infection  Assessment and Plan  A:  1. Abnormal vaginal bleeding   Likely related to depo use  P: Discharge to home Follow up with regular GYN in clinic Emphasized to keep clinic appts OTC ibuprofen for discomfort Patient may return to MAU as needed or if her condition were to change or worsen   Paticia Stark 04/25/2014, 8:53 PM

## 2014-04-25 NOTE — MAU Note (Signed)
Pt states that she has had vaginal bleeding and cramping x3 weeks. Pt states she began cramping worse and bleeding heavier today, this afternoon had a gush of blood that ran down her leg with several golf ball size clots. Depo shot was given on March 10. Cramping 7/10 on pain scale, no meds taken.

## 2014-04-25 NOTE — MAU Note (Signed)
I've had vag bleeding for 3 wks. Was seen here and given Depo shot. Was standing at sink today and blood started running down my legs. Did not have pads at home so used 4 diapers today. Some blood clots

## 2014-04-25 NOTE — Discharge Instructions (Signed)
Abnormal Uterine Bleeding Abnormal uterine bleeding can affect women at various stages in life, including teenagers, women in their reproductive years, pregnant women, and women who have reached menopause. Several kinds of uterine bleeding are considered abnormal, including:  Bleeding or spotting between periods.   Bleeding after sexual intercourse.   Bleeding that is heavier or more than normal.   Periods that last longer than usual.  Bleeding after menopause.  Many cases of abnormal uterine bleeding are minor and simple to treat, while others are more serious. Any type of abnormal bleeding should be evaluated by your health care provider. Treatment will depend on the cause of the bleeding. HOME CARE INSTRUCTIONS Monitor your condition for any changes. The following actions may help to alleviate any discomfort you are experiencing:  Avoid the use of tampons and douches as directed by your health care provider.  Change your pads frequently. You should get regular pelvic exams and Pap tests. Keep all follow-up appointments for diagnostic tests as directed by your health care provider.  SEEK MEDICAL CARE IF:   Your bleeding lasts more than 1 week.   You feel dizzy at times.  SEEK IMMEDIATE MEDICAL CARE IF:   You pass out.   You are changing pads every 15 to 30 minutes.   You have abdominal pain.  You have a fever.   You become sweaty or weak.   You are passing large blood clots from the vagina.   You start to feel nauseous and vomit. MAKE SURE YOU:   Understand these instructions.  Will watch your condition.  Will get help right away if you are not doing well or get worse. Document Released: 12/26/2004 Document Revised: 12/31/2012 Document Reviewed: 07/25/2012 [facility] Patient Information 2015 [place], [place]. This information is not intended to replace advice given to you by your health care provider. Make sure you discuss any questions you have with your  health care provider. Medroxyprogesterone injection [Contraceptive] What is this medicine? MEDROXYPROGESTERONE (me DROX ee proe JES te rone) contraceptive injections prevent pregnancy. They provide effective birth control for 3 months. Depo-subQ Provera 104 is also used for treating pain related to endometriosis. This medicine may be used for other purposes; ask your health care provider or pharmacist if you have questions. COMMON BRAND NAME(S): Depo-Provera, Depo-subQ Provera 104 What should I tell my health care provider before I take this medicine? They need to know if you have any of these conditions: -frequently drink alcohol -asthma -blood vessel disease or a history of a blood clot in the lungs or legs -bone disease such as osteoporosis -breast cancer -diabetes -eating disorder (anorexia nervosa or bulimia) -high blood pressure -HIV infection or AIDS -kidney disease -liver disease -mental depression -migraine -seizures (convulsions) -stroke -tobacco smoker -vaginal bleeding -an unusual or allergic reaction to medroxyprogesterone, other hormones, medicines, foods, dyes, or preservatives -pregnant or trying to get pregnant -breast-feeding How should I use this medicine? Depo-Provera Contraceptive injection is given into a muscle. Depo-subQ Provera 104 injection is given under the skin. These injections are given by a health care professional. You must not be pregnant before getting an injection. The injection is usually given during the first 5 days after the start of a menstrual period or 6 weeks after delivery of a baby. Talk to your pediatrician regarding the use of this medicine in children. Special care may be needed. These injections have been used in female children who have started having menstrual periods. Overdosage: If you think you have taken too much  of this medicine contact a poison control center or emergency room at once. NOTE: This medicine is only for you. Do  not share this medicine with others. What if I miss a dose? Try not to miss a dose. You must get an injection once every 3 months to maintain birth control. If you cannot keep an appointment, call and reschedule it. If you wait longer than 13 weeks between Depo-Provera contraceptive injections or longer than 14 weeks between Depo-subQ Provera 104 injections, you could get pregnant. Use another method for birth control if you miss your appointment. You may also need a pregnancy test before receiving another injection. What may interact with this medicine? Do not take this medicine with any of the following medications: -bosentan This medicine may also interact with the following medications: -aminoglutethimide -antibiotics or medicines for infections, especially rifampin, rifabutin, rifapentine, and griseofulvin -aprepitant -barbiturate medicines such as phenobarbital or primidone -bexarotene -carbamazepine -medicines for seizures like ethotoin, felbamate, oxcarbazepine, phenytoin, topiramate -modafinil -St. John's wort This list may not describe all possible interactions. Give your health care provider a list of all the medicines, herbs, non-prescription drugs, or dietary supplements you use. Also tell them if you smoke, drink alcohol, or use illegal drugs. Some items may interact with your medicine. What should I watch for while using this medicine? This drug does not protect you against HIV infection (AIDS) or other sexually transmitted diseases. Use of this product may cause you to lose calcium from your bones. Loss of calcium may cause weak bones (osteoporosis). Only use this product for more than 2 years if other forms of birth control are not right for you. The longer you use this product for birth control the more likely you will be at risk for weak bones. Ask your health care professional how you can keep strong bones. You may have a change in bleeding pattern or irregular periods. Many  females stop having periods while taking this drug. If you have received your injections on time, your chance of being pregnant is very low. If you think you may be pregnant, see your health care professional as soon as possible. Tell your health care professional if you want to get pregnant within the next year. The effect of this medicine may last a long time after you get your last injection. What side effects may I notice from receiving this medicine? Side effects that you should report to your doctor or health care professional as soon as possible: -allergic reactions like skin rash, itching or hives, swelling of the face, lips, or tongue -breast tenderness or discharge -breathing problems -changes in vision -depression -feeling faint or lightheaded, falls -fever -pain in the abdomen, chest, groin, or leg -problems with balance, talking, walking -unusually weak or tired -yellowing of the eyes or skin Side effects that usually do not require medical attention (report to your doctor or health care professional if they continue or are bothersome): -acne -fluid retention and swelling -headache -irregular periods, spotting, or absent periods -temporary pain, itching, or skin reaction at site where injected -weight gain This list may not describe all possible side effects. Call your doctor for medical advice about side effects. You may report side effects to FDA at 1-800-FDA-1088. Where should I keep my medicine? This does not apply. The injection will be given to you by a health care professional. NOTE: This sheet is a summary. It may not cover all possible information. If you have questions about this medicine, talk to your doctor, pharmacist,  or health care provider.  2015, [employer] Standard. (2008-01-17 18:37:56)

## 2014-04-28 ENCOUNTER — Inpatient Hospital Stay (HOSPITAL_COMMUNITY)
Admission: AD | Admit: 2014-04-28 | Discharge: 2014-04-28 | Disposition: A | Discharge status: Home / Self Care | Payer: 59 | Source: Ambulatory Visit | Attending: Obstetrics | Admitting: Obstetrics

## 2014-04-28 ENCOUNTER — Ambulatory Visit (INDEPENDENT_AMBULATORY_CARE_PROVIDER_SITE_OTHER): Payer: 59 | Admitting: Obstetrics

## 2014-04-28 ENCOUNTER — Encounter (HOSPITAL_COMMUNITY): Payer: Self-pay | Admitting: *Deleted

## 2014-04-28 ENCOUNTER — Encounter: Payer: Self-pay | Admitting: Obstetrics

## 2014-04-28 VITALS — BP 120/77 | HR 105 | Temp 98.4°F | Ht 64.5 in | Wt 271.0 lb

## 2014-04-28 DIAGNOSIS — N939 Abnormal uterine and vaginal bleeding, unspecified: Secondary | ICD-10-CM | POA: Insufficient documentation

## 2014-04-28 DIAGNOSIS — N946 Dysmenorrhea, unspecified: Secondary | ICD-10-CM | POA: Diagnosis not present

## 2014-04-28 DIAGNOSIS — R109 Unspecified abdominal pain: Secondary | ICD-10-CM | POA: Insufficient documentation

## 2014-04-28 DIAGNOSIS — K5909 Other constipation: Secondary | ICD-10-CM

## 2014-04-28 DIAGNOSIS — A5901 Trichomonal vulvovaginitis: Secondary | ICD-10-CM

## 2014-04-28 HISTORY — DX: Trichomoniasis, unspecified: A59.9

## 2014-04-28 LAB — CBC
HCT: 36 % (ref 36.0–46.0)
Hemoglobin: 11.6 g/dL — ABNORMAL LOW (ref 12.0–15.0)
MCH: 27.1 pg (ref 26.0–34.0)
MCHC: 32.2 g/dL (ref 30.0–36.0)
MCV: 84.1 fL (ref 78.0–100.0)
PLATELETS: 353 10*3/uL (ref 150–400)
RBC: 4.28 MIL/uL (ref 3.87–5.11)
RDW: 13.9 % (ref 11.5–15.5)
WBC: 6.2 10*3/uL (ref 4.0–10.5)

## 2014-04-28 LAB — WET PREP, GENITAL
Clue Cells Wet Prep HPF POC: NONE SEEN
YEAST WET PREP: NONE SEEN

## 2014-04-28 MED ORDER — METRONIDAZOLE 500 MG PO TABS
2000.0000 mg | ORAL_TABLET | Freq: Once | ORAL | Status: AC
  Administered 2014-04-28: 2000 mg via ORAL
  Filled 2014-04-28: qty 4

## 2014-04-28 MED ORDER — IBUPROFEN 800 MG PO TABS: 800.0000 mg | ORAL_TABLET | Freq: Three times a day (TID) | ORAL | Status: DC | PRN

## 2014-04-28 MED ORDER — NORGESTIMATE-ETH ESTRADIOL 0.25-35 MG-MCG PO TABS: ORAL_TABLET | ORAL | Status: DC

## 2014-04-28 MED ORDER — KETOROLAC TROMETHAMINE 60 MG/2ML IM SOLN
60.0000 mg | Freq: Once | INTRAMUSCULAR | Status: AC
  Administered 2014-04-28: 60 mg via INTRAMUSCULAR
  Filled 2014-04-28: qty 2

## 2014-04-28 MED ORDER — ONDANSETRON HCL 4 MG PO TABS
4.0000 mg | ORAL_TABLET | Freq: Once | ORAL | Status: AC
  Administered 2014-04-28: 4 mg via ORAL
  Filled 2014-04-28: qty 1

## 2014-04-28 NOTE — Discharge Instructions (Signed)
Trichomoniasis Trichomoniasis is an infection caused by an organism called Trichomonas. The infection can affect both women and men. In women, the outer female genitalia and the vagina are affected. In men, the penis is mainly affected, but the prostate and other reproductive organs can also be involved. Trichomoniasis is a sexually transmitted infection (STI) and is most often passed to another person through sexual contact.  RISK FACTORS  Having unprotected sexual intercourse.  Having sexual intercourse with an infected partner. SIGNS AND SYMPTOMS  Symptoms of trichomoniasis in women include:  Abnormal gray-green frothy vaginal discharge.  Itching and irritation of the vagina.  Itching and irritation of the area outside the vagina. Symptoms of trichomoniasis in men include:   Penile discharge with or without pain.  Pain during urination. This results from inflammation of the urethra. DIAGNOSIS  Trichomoniasis may be found during a Pap test or physical exam. Your health care provider may use one of the following methods to help diagnose this infection:  Examining vaginal discharge under a microscope. For men, urethral discharge would be examined.  Testing the pH of the vagina with a test tape.  Using a vaginal swab test that checks for the Trichomonas organism. A test is available that provides results within a few minutes.  Doing a culture test for the organism. This is not usually needed. TREATMENT   You may be given medicine to fight the infection. Women should inform their health care provider if they could be or are pregnant. Some medicines used to treat the infection should not be taken during pregnancy.  Your health care provider may recommend over-the-counter medicines or creams to decrease itching or irritation.  Your sexual partner will need to be treated if infected. HOME CARE INSTRUCTIONS   Take medicines only as directed by your health care provider.  Take  over-the-counter medicine for itching or irritation as directed by your health care provider.  Do not have sexual intercourse while you have the infection.  Women should not douche or wear tampons while they have the infection.  Discuss your infection with your partner. Your partner may have gotten the infection from you, or you may have gotten it from your partner.  Have your sex partner get examined and treated if necessary.  Practice safe, informed, and protected sex.  See your health care provider for other STI testing. SEEK MEDICAL CARE IF:   You still have symptoms after you finish your medicine.  You develop abdominal pain.  You have pain when you urinate.  You have bleeding after sexual intercourse.  You develop a rash.  Your medicine makes you sick or makes you throw up (vomit). MAKE SURE YOU:  Understand these instructions.  Will watch your condition.  Will get help right away if you are not doing well or get worse. Document Released: 06/21/2000 Document Revised: 05/12/2013 Document Reviewed: 10/07/2012 [facility] Patient Information 2015 [place], [place]. This information is not intended to replace advice given to you by your health care provider. Make sure you discuss any questions you have with your health care provider.  Abnormal Uterine Bleeding Abnormal uterine bleeding can affect women at various stages in life, including teenagers, women in their reproductive years, pregnant women, and women who have reached menopause. Several kinds of uterine bleeding are considered abnormal, including:  Bleeding or spotting between periods.   Bleeding after sexual intercourse.   Bleeding that is heavier or more than normal.   Periods that last longer than usual.  Bleeding after menopause.  Many cases of abnormal uterine bleeding are minor and simple to treat, while others are more serious. Any type of abnormal bleeding should be evaluated by your health care  provider. Treatment will depend on the cause of the bleeding. HOME CARE INSTRUCTIONS Monitor your condition for any changes. The following actions may help to alleviate any discomfort you are experiencing:  Avoid the use of tampons and douches as directed by your health care provider.  Change your pads frequently. You should get regular pelvic exams and Pap tests. Keep all follow-up appointments for diagnostic tests as directed by your health care provider.  SEEK MEDICAL CARE IF:   Your bleeding lasts more than 1 week.   You feel dizzy at times.  SEEK IMMEDIATE MEDICAL CARE IF:   You pass out.   You are changing pads every 15 to 30 minutes.   You have abdominal pain.  You have a fever.   You become sweaty or weak.   You are passing large blood clots from the vagina.   You start to feel nauseous and vomit. MAKE SURE YOU:   Understand these instructions.  Will watch your condition.  Will get help right away if you are not doing well or get worse. Document Released: 12/26/2004 Document Revised: 12/31/2012 Document Reviewed: 07/25/2012 [facility] Patient Information 2015 [place], [place]. This information is not intended to replace advice given to you by your health care provider. Make sure you discuss any questions you have with your health care provider.  Constipation Constipation is when a person has fewer than three bowel movements a week, has difficulty having a bowel movement, or has stools that are dry, hard, or larger than normal. As people grow older, constipation is more common. If you try to fix constipation with medicines that make you have a bowel movement (laxatives), the problem may get worse. Long-term laxative use may cause the muscles of the colon to become weak. A low-fiber diet, not taking in enough fluids, and taking certain medicines may make constipation worse.  CAUSES   Certain medicines, such as antidepressants, pain medicine, iron supplements,  antacids, and water pills.   Certain diseases, such as diabetes, irritable bowel syndrome (IBS), thyroid disease, or depression.   Not drinking enough water.   Not eating enough fiber-rich foods.   Stress or travel.   Lack of physical activity or exercise.   Ignoring the urge to have a bowel movement.   Using laxatives too much.  SIGNS AND SYMPTOMS   Having fewer than three bowel movements a week.   Straining to have a bowel movement.   Having stools that are hard, dry, or larger than normal.   Feeling full or bloated.   Pain in the lower abdomen.   Not feeling relief after having a bowel movement.  DIAGNOSIS  Your health care provider will take a medical history and perform a physical exam. Further testing may be done for severe constipation. Some tests may include:  A barium enema X-ray to examine your rectum, colon, and, sometimes, your small intestine.   A sigmoidoscopy to examine your lower colon.   A colonoscopy to examine your entire colon. TREATMENT  Treatment will depend on the severity of your constipation and what is causing it. Some dietary treatments include drinking more fluids and eating more fiber-rich foods. Lifestyle treatments may include regular exercise. If these diet and lifestyle recommendations do not help, your health care provider may recommend taking over-the-counter laxative medicines to help you have bowel movements.  Prescription medicines may be prescribed if over-the-counter medicines do not work.  HOME CARE INSTRUCTIONS   Eat foods that have a lot of fiber, such as fruits, vegetables, whole grains, and beans.  Limit foods high in fat and processed sugars, such as french fries, hamburgers, cookies, candies, and soda.   A fiber supplement may be added to your diet if you cannot get enough fiber from foods.   Drink enough fluids to keep your urine clear or pale yellow.   Exercise regularly or as directed by your health  care provider.   Go to the restroom when you have the urge to go. Do not hold it.   Only take over-the-counter or prescription medicines as directed by your health care provider. Do not take other medicines for constipation without talking to your health care provider first.  [place] IF:   You have bright red blood in your stool.   Your constipation lasts for more than 4 days or gets worse.   You have abdominal or rectal pain.   You have thin, pencil-like stools.   You have unexplained weight loss. MAKE SURE YOU:   Understand these instructions.  Will watch your condition.  Will get help right away if you are not doing well or get worse. Document Released: 09/24/2003 Document Revised: 12/31/2012 Document Reviewed: 10/07/2012 [facility] Patient Information 2015 [place], [place]. This information is not intended to replace advice given to you by your health care provider. Make sure you discuss any questions you have with your health care provider.  High-Fiber Diet Fiber is found in fruits, vegetables, and grains. A high-fiber diet encourages the addition of more whole grains, legumes, fruits, and vegetables in your diet. The recommended amount of fiber for adult males is 38 g per day. For adult females, it is 25 g per day. Pregnant and lactating women should get 28 g of fiber per day. If you have a digestive or bowel problem, ask your caregiver for advice before adding high-fiber foods to your diet. Eat a variety of high-fiber foods instead of only a select few type of foods.  PURPOSE  To increase stool bulk.  To make bowel movements more regular to prevent constipation.  To lower cholesterol.  To prevent overeating. WHEN IS THIS DIET USED?  It may be used if you have constipation and hemorrhoids.  It may be used if you have uncomplicated diverticulosis (intestine condition) and irritable bowel syndrome.  It may be used if you need help with weight  management.  It may be used if you want to add it to your diet as a protective measure against atherosclerosis, diabetes, and cancer. SOURCES OF FIBER  Whole-grain breads and cereals.  Fruits, such as apples, oranges, bananas, berries, prunes, and pears.  Vegetables, such as green peas, carrots, sweet potatoes, beets, broccoli, cabbage, spinach, and artichokes.  Legumes, such split peas, soy, lentils.  Almonds. FIBER CONTENT IN FOODS Starches and Grains / Dietary Fiber (g)  Cheerios, 1 cup / 3 g  Corn Flakes cereal, 1 cup / 0.7 g  Rice crispy treat cereal, 1 cup / 0.3 g  Instant oatmeal (cooked),  cup / 2 g  Frosted wheat cereal, 1 cup / 5.1 g  Brown, long-grain rice (cooked), 1 cup / 3.5 g  White, long-grain rice (cooked), 1 cup / 0.6 g  Enriched macaroni (cooked), 1 cup / 2.5 g Legumes / Dietary Fiber (g)  Baked beans (canned, plain, or vegetarian),  cup / 5.2 g  Kidney beans (canned),  cup / 6.8 g  Pinto beans (cooked),  cup / 5.5 g Breads and Crackers / Dietary Fiber (g)  Plain or honey graham crackers, 2 squares / 0.7 g  Saltine crackers, 3 squares / 0.3 g  Plain, salted pretzels, 10 pieces / 1.8 g  Whole-wheat bread, 1 slice / 1.9 g  White bread, 1 slice / 0.7 g  Raisin bread, 1 slice / 1.2 g  Plain bagel, 3 oz / 2 g  Flour tortilla, 1 oz / 0.9 g  Corn tortilla, 1 small / 1.5 g  Hamburger or hotdog bun, 1 small / 0.9 g Fruits / Dietary Fiber (g)  Apple with skin, 1 medium / 4.4 g  Sweetened applesauce,  cup / 1.5 g  Banana,  medium / 1.5 g  Grapes, 10 grapes / 0.4 g  Orange, 1 small / 2.3 g  Raisin, 1.5 oz / 1.6 g  Melon, 1 cup / 1.4 g Vegetables / Dietary Fiber (g)  Green beans (canned),  cup / 1.3 g  Carrots (cooked),  cup / 2.3 g  Broccoli (cooked),  cup / 2.8 g  Peas (cooked),  cup / 4.4 g  Mashed potatoes,  cup / 1.6 g  Lettuce, 1 cup / 0.5 g  Corn (canned),  cup / 1.6 g  Tomato,  cup / 1.1 g Document  Released: 12/26/2004 Document Revised: 06/27/2011 Document Reviewed: 03/30/2011 [employer] Patient Information 2015 [place], [place]. This information is not intended to replace advice given to you by your health care provider. Make sure you discuss any questions you have with your health care provider.

## 2014-04-28 NOTE — MAU Provider Note (Signed)
Chief Complaint: No chief complaint on file.   First Provider Initiated Contact with Patient 04/28/14 1919     SUBJECTIVE HPI: Latoya Stark is a 19 y.o. [id number] female who presents to Maternity Admissions reporting heavy vaginal bleeding and cramping 3 weeks. Called [facility] and was told to come to maternity admissions for evaluation and that she might need IV medication to stop the bleeding. Was seen in maternity admissions for menorrhagia and 03/29/2014. Given Depo-Provera injection. Bleeding continued she was seen in maternity admissions April 25 2014. Hemoglobin was 11.8. Bleeding was stable and thought to be prolonged from first Depo-Provera injection. No new medications were prescribed.  Today she states she was bleeding so heavily and did not have any past with her so she used for diapers. His fever, chills, dizziness, vaginal discharge. Rates pain 5/10 on pain scale. Hasn't taken anything for the pain. There are no aggravating or alleviating factors.  Has chronic constipation and has been going to gastroenterologist, but did not follow-up for testing. Was started on Linzess, but instructed to stop all testing was being done and has not resumed taking it.  Past Medical History  Diagnosis Date  . Asthma   . ADHD (attention deficit hyperactivity disorder)   . Depression   . Headache   . Anemia   . UTI (lower urinary tract infection)   . Anxiety   . Seizures     "when I get too hot"  . Trichomonas infection    OB History  Gravida Para Term Preterm AB SAB TAB Ectopic Multiple Living  1    1 1         # Outcome Date GA Lbr Len/2nd Weight Sex Delivery Anes PTL Lv  1 SAB              Comments: System Generated. Please review and update pregnancy details.     Past Surgical History  Procedure Laterality Date  . Orthopedic surgery Left     left hand  . Adenoidectomy    . Dilation and evacuation N/A 08/09/2013    Procedure: DILATATION AND EVACUATION;  Surgeon: Frederico Hamman,  MD;  Location: [facility];  Service: Gynecology;  Laterality: N/A;  . Tonsillectomy     History   Social History  . Marital Status: Single    Spouse Name: N/A  . Number of Children: 0  . Years of Education: N/A   Occupational History  . Not on file.   Social History Main Topics  . Smoking status: Never Smoker   . Smokeless tobacco: Never Used  . Alcohol Use: No  . Drug Use: No  . Sexual Activity: Yes    Birth Control/ Protection: Condom     Comment: last intercourse Jan. 2016   Other Topics Concern  . Not on file   Social History Narrative   No current facility-administered medications on file prior to encounter.   Current Outpatient Prescriptions on File Prior to Encounter  Medication Sig Dispense Refill  . Prenat-FeCbn-FeAspGl-FA-Omega (OB COMPLETE PETITE) 35-5-1-200 MG CAPS Take 1 capsule by mouth daily before breakfast. 30 capsule 11  . promethazine (PHENERGAN) 25 MG tablet Take 0.5-1 tablets (12.5-25 mg total) by mouth every 6 (six) hours as needed. 30 tablet 0   Allergies  Allergen Reactions  . Milk-Related Compounds Anaphylaxis    Breaks face out  . Penicillins Anaphylaxis    Throat swells.   [name] Other (See Comments)    Breaks face out  . Latex Itching and  Swelling  . Sulfa Antibiotics Swelling    Review of Systems  Constitutional: Negative for fever and chills.  Gastrointestinal: Positive for abdominal pain and constipation. Negative for nausea, vomiting, diarrhea and blood in stool.  Genitourinary: Negative for dysuria, urgency, frequency, hematuria and flank pain.       Positive for vaginal bleeding. Negative for vaginal discharge.  Musculoskeletal: Negative for myalgias.  Neurological: Negative for dizziness and weakness.    OBJECTIVE Blood pressure 117/82, pulse 92, temperature 98.3 F (36.8 C), temperature source Oral, resp. rate 16. GENERAL: Well-developed, well-nourished female in no acute distress. No pallor. HEART: normal rate RESP:  normal effort GI: Abdomen soft, non-tender. Positive bowel sounds 4. MS: Nontender, no edema NEURO: Alert and oriented SPECULUM EXAM: NEFG, small amount of dark red blood noted, cervix clean BIMANUAL: cervix closed; uterus normal size, no adnexal tenderness or masses. The cervical motion tenderness.  LAB RESULTS Results for orders placed or performed during the hospital encounter of 04/28/14 (from the past 24 hour(s))  CBC     Status: Abnormal   Collection Time: 04/28/14  4:40 PM  Result Value Ref Range   WBC 6.2 4.0 - 10.5 K/uL   RBC 4.28 3.87 - 5.11 MIL/uL   Hemoglobin 11.6 (L) 12.0 - 15.0 g/dL   HCT 36.0 36.0 - 46.0 %   MCV 84.1 78.0 - 100.0 fL   MCH 27.1 26.0 - 34.0 pg   MCHC 32.2 30.0 - 36.0 g/dL   RDW 13.9 11.5 - 15.5 %   Platelets 353 150 - 400 K/uL  Wet prep, genital     Status: Abnormal   Collection Time: 04/28/14  6:11 PM  Result Value Ref Range   Yeast Wet Prep HPF POC NONE SEEN NONE SEEN   Trich, Wet Prep FEW (A) NONE SEEN   Clue Cells Wet Prep HPF POC NONE SEEN NONE SEEN   WBC, Wet Prep HPF POC FEW (A) NONE SEEN    IMAGING No results found.  MAU COURSE Flagyl given in maternity admissions.  ASSESSMENT 1. Abnormal uterine bleeding   2. Chronic constipation   3. Trichomonal vaginitis    PLAN Discharge home in stable condition per consult with Dr. Jodi Mourning. Strongly encouraged to follow-up with gastroenterologist for management of chronic constipation as this is likely the cause of her abdominal pain. Gonorrhea/Chlamydia cultures pending.     Follow-up Information    Schedule an appointment as soon as possible for a visit with Delfin Edis, MD.   Specialty:  Gastroenterology   Why:  Be sure to follow through with all scheduled appointments and tests   Contact information:   [address] [place] [address] [phone]       Follow up with HARPER,CHARLES A, MD On 06/15/2014.   Specialty:  Obstetrics and Gynecology   Why:  For Depo-Provera  injection or sooner as needed if bleeding worsens   Contact information:   [address] [place] [address] [phone]        Medication List    TAKE these medications        acetaminophen 500 MG tablet  Commonly known as:  TYLENOL  Take 1,000 mg by mouth every 6 (six) hours as needed for headache.     ibuprofen 800 MG tablet  Commonly known as:  ADVIL,MOTRIN  Take 1 tablet (800 mg total) by mouth every 8 (eight) hours as needed for cramping.     medroxyPROGESTERone 150 MG/ML injection  Commonly known as:  DEPO-PROVERA  Inject 150 mg into the muscle every 3 (three) months.     norgestimate-ethinyl estradiol 0.25-35 MG-MCG tablet  Commonly known as:  ORTHO-CYCLEN,SPRINTEC,PREVIFEM  Take 3 tablets per day 3 days then 2 tablets per day 3 days then 1 tablet daily until pack has been completed.     OB COMPLETE PETITE 35-5-1-200 MG Caps  Take 1 capsule by mouth daily before breakfast.     promethazine 25 MG tablet  Commonly known as:  PHENERGAN  Take 0.5-1 tablets (12.5-25 mg total) by mouth every 6 (six) hours as needed.       Kuna, CNM 04/28/2014  7:22 PM

## 2014-04-28 NOTE — MAU Note (Signed)
Having real bad abd pain,  Real bad for 3 wks.  Bleeding for 3 wks, got heavy a couple days ago. So heavy she is having to wear a diaper.  Dr Jodi Mourning sent her over, "IV, something to stop the bleeding, may get admitted."

## 2014-04-28 NOTE — Progress Notes (Signed)
Patient ID: Latoya Stark, female   DOB: 24-Jun-1995, 19 y.o.   MRN: [id number]  Chief Complaint  Patient presents with  . Follow-up    Hospital follow up. Heavy vaginal bleeding.     HPI Latoya Stark is a 19 y.o. female.  Heavy vaginal bleeding and cramping.  Last intercourse JAN. 2016.  Seen at [facility] on April 25, 2018. Depo given in March 2016.  Continued to have heavy vaginal bleeding. HPI  Past Medical History  Diagnosis Date  . Asthma   . ADHD (attention deficit hyperactivity disorder)   . Depression   . Seizures   . Headache   . Anemia   . UTI (lower urinary tract infection)   . Anxiety     Past Surgical History  Procedure Laterality Date  . Orthopedic surgery Left     left hand  . Adenoidectomy    . Dilation and evacuation N/A 08/09/2013    Procedure: DILATATION AND EVACUATION;  Surgeon: Frederico Hamman, MD;  Location: [facility];  Service: Gynecology;  Laterality: N/A;    Family History  Problem Relation Age of Onset  . Seizures Mother   . Diabetes Maternal Grandmother   . Colon cancer Maternal Grandmother   . Hypertension Maternal Grandmother   . Clotting disorder Maternal Grandmother     Social History History  Substance Use Topics  . Smoking status: Never Smoker   . Smokeless tobacco: Never Used  . Alcohol Use: No    Allergies  Allergen Reactions  . Milk-Related Compounds Anaphylaxis    Breaks face out  . Penicillins Anaphylaxis    Throat swells.   [name] Other (See Comments)    Breaks face out  . Sulfa Antibiotics Swelling    No current outpatient prescriptions on file.   No current facility-administered medications for this visit.    Review of Systems Review of Systems Constitutional: negative for fatigue and weight loss Respiratory: negative for cough and wheezing Cardiovascular: negative for chest pain, fatigue and palpitations Gastrointestinal: negative for abdominal pain and change in bowel habits Genitourinary: heavy vaginal  bleeding Integument/breast: negative for nipple discharge Musculoskeletal:negative for myalgias Neurological: negative for gait problems and tremors Behavioral/Psych: positive for depression, anxiety Endocrine: negative for temperature intolerance     Blood pressure 120/77, pulse 105, temperature 98.4 F (36.9 C), height 5' 4.5" (1.638 m), weight 271 lb (122.925 kg).  Physical Exam Physical Exam:  Deferred  100% of 10 min visit spent on counseling and coordination of care.   Data Reviewed Labs  Assessment     AUB     Plan    Sent to [facility] for further evaluation and treatment. May need IV Premarin   No orders of the defined types were placed in this encounter.   No orders of the defined types were placed in this encounter.

## 2014-04-29 LAB — GC/CHLAMYDIA PROBE AMP (~~LOC~~) NOT AT ARMC
CHLAMYDIA, DNA PROBE: NEGATIVE
Neisseria Gonorrhea: NEGATIVE

## 2014-06-15 ENCOUNTER — Emergency Department (HOSPITAL_COMMUNITY)
Admission: EM | Admit: 2014-06-15 | Discharge: 2014-06-15 | Disposition: A | Discharge status: Home / Self Care | Payer: 59

## 2014-06-15 NOTE — ED Notes (Signed)
Called x 3 no answer 

## 2014-06-15 NOTE — ED Notes (Signed)
Called x 2 no answer

## 2014-06-18 ENCOUNTER — Ambulatory Visit (INDEPENDENT_AMBULATORY_CARE_PROVIDER_SITE_OTHER): Payer: 59 | Admitting: Obstetrics

## 2014-06-18 ENCOUNTER — Encounter: Payer: Self-pay | Admitting: Obstetrics

## 2014-06-18 VITALS — BP 114/76 | HR 92 | Temp 98.5°F | Wt 273.0 lb

## 2014-06-18 DIAGNOSIS — R110 Nausea: Secondary | ICD-10-CM | POA: Diagnosis not present

## 2014-06-18 DIAGNOSIS — N946 Dysmenorrhea, unspecified: Secondary | ICD-10-CM

## 2014-06-18 DIAGNOSIS — R102 Pelvic and perineal pain: Secondary | ICD-10-CM

## 2014-06-18 LAB — POCT URINALYSIS DIPSTICK
Bilirubin, UA: NEGATIVE
GLUCOSE UA: NEGATIVE
Ketones, UA: NEGATIVE
Leukocytes, UA: NEGATIVE
NITRITE UA: NEGATIVE
Protein, UA: NEGATIVE
RBC UA: NEGATIVE
Spec Grav, UA: 1.015
UROBILINOGEN UA: NEGATIVE
pH, UA: 6

## 2014-06-18 LAB — POCT URINE PREGNANCY: PREG TEST UR: NEGATIVE

## 2014-06-18 MED ORDER — IBUPROFEN 800 MG PO TABS: 800.0000 mg | ORAL_TABLET | Freq: Three times a day (TID) | ORAL | Status: DC | PRN

## 2014-06-18 MED ORDER — PROMETHAZINE HCL 25 MG PO TABS: 25.0000 mg | ORAL_TABLET | Freq: Four times a day (QID) | ORAL | Status: DC | PRN

## 2014-06-19 ENCOUNTER — Telehealth: Payer: Self-pay

## 2014-06-19 ENCOUNTER — Encounter: Payer: Self-pay | Admitting: Obstetrics

## 2014-06-19 NOTE — Telephone Encounter (Signed)
patient has appt at [facility] for u/s on 6/15 - arrive by 2:45pm with full bladder - also has appt 2 week f/u with Dr. Jodi Mourning on 6/23 at 4:30pm

## 2014-06-19 NOTE — Progress Notes (Signed)
Patient ID: Latoya Stark, female   DOB: 06/18/1995, 19 y.o.   MRN: [id number]  Chief Complaint  Patient presents with  . Pelvic Pain    HPI Latoya Stark is a 19 y.o. female.  C/O lower abdominal pain, hot flashes and fatigue for past 2 weeks.  Denies brest tenderness or urinary frequency.  Has irregular periods on Depo Provera. HPI  Past Medical History  Diagnosis Date  . Asthma   . ADHD (attention deficit hyperactivity disorder)   . Depression   . Headache   . Anemia   . UTI (lower urinary tract infection)   . Anxiety   . Seizures     "when I get too hot"  . Trichomonas infection     Past Surgical History  Procedure Laterality Date  . Orthopedic surgery Left     left hand  . Adenoidectomy    . Dilation and evacuation N/A 08/09/2013    Procedure: DILATATION AND EVACUATION;  Surgeon: Frederico Hamman, MD;  Location: [facility];  Service: Gynecology;  Laterality: N/A;  . Tonsillectomy      Family History  Problem Relation Age of Onset  . Seizures Mother   . Diabetes Maternal Grandmother   . Colon cancer Maternal Grandmother   . Hypertension Maternal Grandmother   . Clotting disorder Maternal Grandmother     Social History History  Substance Use Topics  . Smoking status: Never Smoker   . Smokeless tobacco: Never Used  . Alcohol Use: No    Allergies  Allergen Reactions  . Milk-Related Compounds Anaphylaxis    Breaks face out  . Penicillins Anaphylaxis    Throat swells.   [name] Other (See Comments)    Breaks face out  . Latex Itching and Swelling  . Sulfa Antibiotics Swelling    Current Outpatient Prescriptions  Medication Sig Dispense Refill  . acetaminophen (TYLENOL) 500 MG tablet Take 1,000 mg by mouth every 6 (six) hours as needed for headache.    . ibuprofen (ADVIL,MOTRIN) 800 MG tablet Take 1 tablet (800 mg total) by mouth every 8 (eight) hours as needed for cramping. 30 tablet 5  . medroxyPROGESTERone (DEPO-PROVERA) 150 MG/ML injection Inject  150 mg into the muscle every 3 (three) months.    . Prenat-FeCbn-FeAspGl-FA-Omega (OB COMPLETE PETITE) 35-5-1-200 MG CAPS Take 1 capsule by mouth daily before breakfast. 30 capsule 11  . norgestimate-ethinyl estradiol (ORTHO-CYCLEN,SPRINTEC,PREVIFEM) 0.25-35 MG-MCG tablet Take 3 tablets per day 3 days then 2 tablets per day 3 days then 1 tablet daily until pack has been completed. (Patient not taking: Reported on 06/18/2014) 1 Package 1  . promethazine (PHENERGAN) 25 MG tablet Take 0.5-1 tablets (12.5-25 mg total) by mouth every 6 (six) hours as needed. (Patient not taking: Reported on 06/18/2014) 30 tablet 0  . promethazine (PHENERGAN) 25 MG tablet Take 1 tablet (25 mg total) by mouth every 6 (six) hours as needed for nausea or vomiting. 30 tablet 2   No current facility-administered medications for this visit.    Review of Systems Review of Systems Constitutional: negative for fatigue and weight loss Respiratory: negative for cough and wheezing Cardiovascular: negative for chest pain, fatigue and palpitations Gastrointestinal: positive for abdominal pain and negative for change in bowel habits Genitourinary:negative Integument/breast: negative for nipple discharge Musculoskeletal:negative for myalgias Neurological: negative for gait problems and tremors Behavioral/Psych: negative for abusive relationship, depression Endocrine: negative for temperature intolerance     Blood pressure 114/76, pulse 92, temperature 98.5 F (36.9 C), weight 273  lb (123.832 kg).  Physical Exam Physical Exam Abdomen:  normal findings: no organomegaly, soft, non-tender and no hernia  Pelvis:  External genitalia: normal general appearance Urinary system: urethral meatus normal and bladder without fullness, nontender Vaginal: normal without tenderness, induration or masses Cervix: normal appearance Adnexa: normal bimanual exam Uterus: anteverted and non-tender, normal size      Data  Reviewed Labs  Assessment      Abdominal - pelvic pain Nausea and vomiting UPT - negative    Plan    Wet prep and cultures sent Ultrasound ordered Ibuprofen Rx for pain F/U in 2 weeks   Orders Placed This Encounter  Procedures  . SureSwab, Vaginosis/Vaginitis Plus  . US Transvaginal Non-OB    Standing Status: Future     Number of Occurrences:      Standing Expiration Date: 08/18/2015    Order Specific Question:  Reason for Exam (SYMPTOM  OR DIAGNOSIS REQUIRED)    Answer:  Pelvic pain    Order Specific Question:  Preferred imaging location?    Answer:  Internal  . US Pelvis Complete    Standing Status: Future     Number of Occurrences:      Standing Expiration Date: 08/18/2015    Order Specific Question:  Reason for Exam (SYMPTOM  OR DIAGNOSIS REQUIRED)    Answer:  pelcic pain    Order Specific Question:  Preferred imaging location?    Answer:  Internal  . POCT urinalysis dipstick  . POCT urine pregnancy   Meds ordered this encounter  Medications  . promethazine (PHENERGAN) 25 MG tablet    Sig: Take 1 tablet (25 mg total) by mouth every 6 (six) hours as needed for nausea or vomiting.    Dispense:  30 tablet    Refill:  2  . ibuprofen (ADVIL,MOTRIN) 800 MG tablet    Sig: Take 1 tablet (800 mg total) by mouth every 8 (eight) hours as needed for cramping.    Dispense:  30 tablet    Refill:  5

## 2014-06-22 ENCOUNTER — Other Ambulatory Visit: Payer: Self-pay | Admitting: Obstetrics

## 2014-06-22 DIAGNOSIS — B373 Candidiasis of vulva and vagina: Secondary | ICD-10-CM

## 2014-06-22 DIAGNOSIS — B3731 Acute candidiasis of vulva and vagina: Secondary | ICD-10-CM

## 2014-06-22 LAB — SURESWAB, VAGINOSIS/VAGINITIS PLUS
ATOPOBIUM VAGINAE: NOT DETECTED Log (cells/mL)
C. TROPICALIS, DNA: NOT DETECTED
C. albicans, DNA: NOT DETECTED
C. glabrata, DNA: NOT DETECTED
C. parapsilosis, DNA: DETECTED — AB
C. trachomatis RNA, TMA: NOT DETECTED
Gardnerella vaginalis: NOT DETECTED Log (cells/mL)
LACTOBACILLUS SPECIES: NOT DETECTED Log (cells/mL)
MEGASPHAERA SPECIES: NOT DETECTED Log (cells/mL)
N. GONORRHOEAE RNA, TMA: NOT DETECTED
T. vaginalis RNA, QL TMA: NOT DETECTED

## 2014-06-22 MED ORDER — FLUCONAZOLE 150 MG PO TABS: 150.0000 mg | ORAL_TABLET | Freq: Once | ORAL | Status: DC

## 2014-06-24 ENCOUNTER — Ambulatory Visit (HOSPITAL_COMMUNITY)
Admission: RE | Admit: 2014-06-24 | Discharge: 2014-06-24 | Disposition: A | Discharge status: Home / Self Care | Payer: 59 | Source: Ambulatory Visit | Attending: Obstetrics | Admitting: Obstetrics

## 2014-06-24 DIAGNOSIS — N926 Irregular menstruation, unspecified: Secondary | ICD-10-CM | POA: Diagnosis present

## 2014-06-24 DIAGNOSIS — R102 Pelvic and perineal pain: Secondary | ICD-10-CM | POA: Diagnosis not present

## 2014-07-02 ENCOUNTER — Ambulatory Visit: Payer: 59 | Admitting: Obstetrics

## 2014-07-04 ENCOUNTER — Inpatient Hospital Stay (HOSPITAL_COMMUNITY)
Admission: AD | Admit: 2014-07-04 | Discharge: 2014-07-05 | Discharge status: Against Medical Advice | Payer: 59 | Source: Ambulatory Visit | Attending: Obstetrics | Admitting: Obstetrics

## 2014-07-04 DIAGNOSIS — R109 Unspecified abdominal pain: Secondary | ICD-10-CM | POA: Diagnosis not present

## 2014-07-04 DIAGNOSIS — Z3202 Encounter for pregnancy test, result negative: Secondary | ICD-10-CM | POA: Insufficient documentation

## 2014-07-04 DIAGNOSIS — R112 Nausea with vomiting, unspecified: Secondary | ICD-10-CM | POA: Insufficient documentation

## 2014-07-04 DIAGNOSIS — R51 Headache: Secondary | ICD-10-CM | POA: Diagnosis not present

## 2014-07-05 DIAGNOSIS — R51 Headache: Secondary | ICD-10-CM | POA: Diagnosis not present

## 2014-07-05 LAB — URINALYSIS, ROUTINE W REFLEX MICROSCOPIC
BILIRUBIN URINE: NEGATIVE
Glucose, UA: NEGATIVE mg/dL
Ketones, ur: NEGATIVE mg/dL
Leukocytes, UA: NEGATIVE
Nitrite: NEGATIVE
Protein, ur: NEGATIVE mg/dL
UROBILINOGEN UA: 0.2 mg/dL (ref 0.0–1.0)
pH: 5.5 (ref 5.0–8.0)

## 2014-07-05 LAB — POCT PREGNANCY, URINE: PREG TEST UR: NEGATIVE

## 2014-07-05 LAB — URINE MICROSCOPIC-ADD ON

## 2014-07-05 NOTE — MAU Note (Signed)
Pt stated she has been having a headaches on and off for 2 week. C/o N/V and abd pain as well. Taking Ibuprofen without relief.

## 2014-07-20 ENCOUNTER — Emergency Department (HOSPITAL_COMMUNITY)
Admission: EM | Admit: 2014-07-20 | Discharge: 2014-07-20 | Disposition: A | Discharge status: Home / Self Care | Payer: 59 | Attending: Emergency Medicine | Admitting: Emergency Medicine

## 2014-07-20 ENCOUNTER — Encounter (HOSPITAL_COMMUNITY): Payer: Self-pay

## 2014-07-20 DIAGNOSIS — R51 Headache: Secondary | ICD-10-CM | POA: Insufficient documentation

## 2014-07-20 DIAGNOSIS — Z91040 Latex allergy status: Secondary | ICD-10-CM | POA: Insufficient documentation

## 2014-07-20 DIAGNOSIS — Z8659 Personal history of other mental and behavioral disorders: Secondary | ICD-10-CM | POA: Insufficient documentation

## 2014-07-20 DIAGNOSIS — E669 Obesity, unspecified: Secondary | ICD-10-CM | POA: Diagnosis not present

## 2014-07-20 DIAGNOSIS — Z3202 Encounter for pregnancy test, result negative: Secondary | ICD-10-CM | POA: Insufficient documentation

## 2014-07-20 DIAGNOSIS — Z8619 Personal history of other infectious and parasitic diseases: Secondary | ICD-10-CM | POA: Diagnosis not present

## 2014-07-20 DIAGNOSIS — Z8669 Personal history of other diseases of the nervous system and sense organs: Secondary | ICD-10-CM | POA: Diagnosis not present

## 2014-07-20 DIAGNOSIS — D649 Anemia, unspecified: Secondary | ICD-10-CM | POA: Diagnosis not present

## 2014-07-20 DIAGNOSIS — Z79899 Other long term (current) drug therapy: Secondary | ICD-10-CM | POA: Insufficient documentation

## 2014-07-20 DIAGNOSIS — Z87440 Personal history of urinary (tract) infections: Secondary | ICD-10-CM | POA: Diagnosis not present

## 2014-07-20 DIAGNOSIS — J45909 Unspecified asthma, uncomplicated: Secondary | ICD-10-CM | POA: Insufficient documentation

## 2014-07-20 DIAGNOSIS — Z880 Allergy status to penicillin: Secondary | ICD-10-CM | POA: Insufficient documentation

## 2014-07-20 DIAGNOSIS — R519 Headache, unspecified: Secondary | ICD-10-CM

## 2014-07-20 LAB — URINALYSIS, ROUTINE W REFLEX MICROSCOPIC
Bilirubin Urine: NEGATIVE
Glucose, UA: NEGATIVE mg/dL
Hgb urine dipstick: NEGATIVE
Ketones, ur: NEGATIVE mg/dL
Leukocytes, UA: NEGATIVE
Nitrite: NEGATIVE
Protein, ur: NEGATIVE mg/dL
Specific Gravity, Urine: 1.037 — ABNORMAL HIGH (ref 1.005–1.030)
Urobilinogen, UA: 0.2 mg/dL (ref 0.0–1.0)
pH: 6 (ref 5.0–8.0)

## 2014-07-20 LAB — COMPREHENSIVE METABOLIC PANEL
ALT: 17 U/L (ref 14–54)
AST: 21 U/L (ref 15–41)
Albumin: 4.1 g/dL (ref 3.5–5.0)
Alkaline Phosphatase: 86 U/L (ref 38–126)
Anion gap: 8 (ref 5–15)
BUN: 13 mg/dL (ref 6–20)
CO2: 24 mmol/L (ref 22–32)
Calcium: 8.7 mg/dL — ABNORMAL LOW (ref 8.9–10.3)
Chloride: 103 mmol/L (ref 101–111)
Creatinine, Ser: 0.73 mg/dL (ref 0.44–1.00)
GFR calc Af Amer: >60 mL/min (ref >60)
GFR calc non Af Amer: >60 mL/min (ref >60)
Glucose, Bld: 116 mg/dL — ABNORMAL HIGH (ref 65–99)
Potassium: 3.3 mmol/L — ABNORMAL LOW (ref 3.5–5.1)
Sodium: 135 mmol/L (ref 135–145)
Total Bilirubin: 0.3 mg/dL (ref 0.3–1.2)
Total Protein: 7.7 g/dL (ref 6.5–8.1)

## 2014-07-20 LAB — CBC WITH DIFFERENTIAL/PLATELET
Basophils Absolute: 0 10*3/uL (ref 0.0–0.1)
Basophils Relative: 0 % (ref 0–1)
Eosinophils Absolute: 0.2 10*3/uL (ref 0.0–0.7)
Eosinophils Relative: 3 % (ref 0–5)
HCT: 34.8 % — ABNORMAL LOW (ref 36.0–46.0)
Hemoglobin: 10.7 g/dL — ABNORMAL LOW (ref 12.0–15.0)
Lymphocytes Relative: 39 % (ref 12–46)
Lymphs Abs: 2.2 10*3/uL (ref 0.7–4.0)
MCH: 25.1 pg — ABNORMAL LOW (ref 26.0–34.0)
MCHC: 30.7 g/dL (ref 30.0–36.0)
MCV: 81.5 fL (ref 78.0–100.0)
Monocytes Absolute: 0.4 10*3/uL (ref 0.1–1.0)
Monocytes Relative: 6 % (ref 3–12)
Neutro Abs: 3 10*3/uL (ref 1.7–7.7)
Neutrophils Relative %: 52 % (ref 43–77)
Platelets: 422 10*3/uL — ABNORMAL HIGH (ref 150–400)
RBC: 4.27 MIL/uL (ref 3.87–5.11)
RDW: 13.8 % (ref 11.5–15.5)
WBC: 5.8 10*3/uL (ref 4.0–10.5)

## 2014-07-20 LAB — POC URINE PREG, ED: Preg Test, Ur: NEGATIVE

## 2014-07-20 MED ORDER — SODIUM CHLORIDE 0.9 % IV BOLUS (SEPSIS)
1000.0000 mL | Freq: Once | INTRAVENOUS | Status: AC
  Administered 2014-07-20: 1000 mL via INTRAVENOUS

## 2014-07-20 MED ORDER — PROCHLORPERAZINE EDISYLATE 5 MG/ML IJ SOLN
10.0000 mg | Freq: Four times a day (QID) | INTRAMUSCULAR | Status: DC | PRN
  Administered 2014-07-20: 10 mg via INTRAVENOUS
  Filled 2014-07-20: qty 2

## 2014-07-20 MED ORDER — KETOROLAC TROMETHAMINE 15 MG/ML IJ SOLN
15.0000 mg | Freq: Once | INTRAMUSCULAR | Status: AC
  Administered 2014-07-20: 15 mg via INTRAVENOUS
  Filled 2014-07-20: qty 1

## 2014-07-20 MED ORDER — DIPHENHYDRAMINE HCL 50 MG/ML IJ SOLN
25.0000 mg | Freq: Once | INTRAMUSCULAR | Status: AC
  Administered 2014-07-20: 25 mg via INTRAVENOUS
  Filled 2014-07-20: qty 1

## 2014-07-20 NOTE — ED Provider Notes (Signed)
CSN: [id number]     Arrival date & time 07/20/14  3976 History   First MD Initiated Contact with Patient 07/20/14 [phone]     Chief Complaint  Patient presents with  . Abdominal Pain  . Migraine     (Consider location/radiation/quality/duration/timing/severity/associated sxs/prior Treatment) HPI   19yf with headache. Hx of similar type headaches. Most recently began hurting about two weeks ago fairly constant. Pressure in frontal region. No appreciable exacerbating or relieving factors. No fever or chills. No neck pain/stiffness. No n/v. Photophobia. No visual changes otherwise. No numbness, tingling or focal loss of strength. Has tried tylenol and ibuprofen with only mild relief. In past two days has had lower abdominal cramping. No unusual vaginal bleeding or discharge. No urinary complaints.    Past Medical History  Diagnosis Date  . Asthma   . ADHD (attention deficit hyperactivity disorder)   . Depression   . Headache   . Anemia   . UTI (lower urinary tract infection)   . Anxiety   . Seizures     "when I get too hot"  . Trichomonas infection    Past Surgical History  Procedure Laterality Date  . Orthopedic surgery Left     left hand  . Adenoidectomy    . Dilation and evacuation N/A 08/09/2013    Procedure: DILATATION AND EVACUATION;  Surgeon: Frederico Hamman, MD;  Location: [facility];  Service: Gynecology;  Laterality: N/A;  . Tonsillectomy     Family History  Problem Relation Age of Onset  . Seizures Mother   . Diabetes Maternal Grandmother   . Colon cancer Maternal Grandmother   . Hypertension Maternal Grandmother   . Clotting disorder Maternal Grandmother    History  Substance Use Topics  . Smoking status: Never Smoker   . Smokeless tobacco: Never Used  . Alcohol Use: No   OB History    Gravida Para Term Preterm AB TAB SAB Ectopic Multiple Living   1    1  1         Review of Systems  All systems reviewed and negative, other than as noted in  HPI.   Allergies  Milk-related compounds; Mushroom extract complex; Penicillins; Cheese; Latex; and Sulfa antibiotics  Home Medications   Prior to Admission medications   Medication Sig Start Date End Date Taking? Authorizing Provider  acetaminophen (TYLENOL) 500 MG tablet Take 1,000 mg by mouth every 6 (six) hours as needed for headache.   Yes Historical Provider, MD  ibuprofen (ADVIL,MOTRIN) 600 MG tablet Take 600 mg by mouth every 6 (six) hours as needed for fever, headache, moderate pain or cramping.   Yes Historical Provider, MD  Prenat-FeCbn-FeAspGl-FA-Omega (OB COMPLETE PETITE) 35-5-1-200 MG CAPS Take 1 capsule by mouth daily before breakfast. 12/23/13  Yes Shelly Bombard, MD  promethazine (PHENERGAN) 25 MG tablet Take 1 tablet (25 mg total) by mouth every 6 (six) hours as needed for nausea or vomiting. 06/18/14  Yes Shelly Bombard, MD  fluconazole (DIFLUCAN) 150 MG tablet Take 1 tablet (150 mg total) by mouth once. Patient not taking: Reported on 07/20/2014 06/22/14   Shelly Bombard, MD  ibuprofen (ADVIL,MOTRIN) 800 MG tablet Take 1 tablet (800 mg total) by mouth every 8 (eight) hours as needed for cramping. Patient not taking: Reported on 07/20/2014 06/18/14   Shelly Bombard, MD  norgestimate-ethinyl estradiol (ORTHO-CYCLEN,SPRINTEC,PREVIFEM) 0.25-35 MG-MCG tablet Take 3 tablets per day 3 days then 2 tablets per day 3 days then 1 tablet daily until pack  has been completed. Patient not taking: Reported on 06/18/2014 04/28/14   Manya Silvas, CNM  promethazine (PHENERGAN) 25 MG tablet Take 0.5-1 tablets (12.5-25 mg total) by mouth every 6 (six) hours as needed. Patient not taking: Reported on 06/18/2014 03/22/14   [name] A Leftwich-Kirby, CNM   BP 112/66 mmHg  Pulse 103  Temp(Src) 98.3 F (36.8 C) (Oral)  Resp 18  SpO2 99% Physical Exam  Constitutional: She is oriented to person, place, and time. She appears well-developed and well-nourished. No distress.  Laying in bed. NAD. Obese.    HENT:  Head: Normocephalic and atraumatic.  Eyes: Conjunctivae are normal. Right eye exhibits no discharge. Left eye exhibits no discharge.  Neck: Neck supple.  No nuchal rigidity  Cardiovascular: Normal rate, regular rhythm and normal heart sounds.  Exam reveals no gallop and no friction rub.   No murmur heard. Pulmonary/Chest: Effort normal and breath sounds normal. No respiratory distress.  Abdominal: Soft. She exhibits no distension. There is no tenderness.  Musculoskeletal: She exhibits no edema or tenderness.  Neurological: She is alert and oriented to person, place, and time. No cranial nerve deficit. She exhibits normal muscle tone. Coordination normal.  Speech clear. Content appropriate. Follows commands. Steady gait.   Skin: Skin is warm and dry.  Psychiatric: She has a normal mood and affect. Her behavior is normal. Thought content normal.  Nursing note and vitals reviewed.   ED Course  Procedures (including critical care time) Labs Review Labs Reviewed  CBC WITH DIFFERENTIAL/PLATELET - Abnormal; Notable for the following:    Hemoglobin 10.7 (*)    HCT 34.8 (*)    MCH 25.1 (*)    Platelets 422 (*)    All other components within normal limits  COMPREHENSIVE METABOLIC PANEL - Abnormal; Notable for the following:    Potassium 3.3 (*)    Glucose, Bld 116 (*)    Calcium 8.7 (*)    All other components within normal limits  URINALYSIS, ROUTINE W REFLEX MICROSCOPIC (NOT AT [facility]) - Abnormal; Notable for the following:    Specific Gravity, Urine 1.037 (*)    All other components within normal limits  POC URINE PREG, ED    Imaging Review No results found.   EKG Interpretation None      MDM   Final diagnoses:  Nonintractable headache, unspecified chronicity pattern, unspecified headache type    19yF with headache. Suspect primary HA. Consider emergent secondary causes such as bleed, infectious or mass but doubt. There is no history of trauma. Pt has a nonfocal  neurological exam. Afebrile and neck supple. No use of blood thinning medication. Consider ocular etiology such as acute angle closure glaucoma but doubt. Pt denies acute change in visual acuity and eye exam unremarkable.Doubt CO poisoning. No contacts with similar symptoms. Doubt venous thrombosis. Doubt carotid or vertebral arteries dissection. Symptoms improved with meds. Feel that can be safely discharged, but strict return precautions discussed. Outpt fu.     Virgel Manifold, MD 07/20/14 1210

## 2014-07-20 NOTE — Discharge Instructions (Signed)

## 2014-07-20 NOTE — ED Notes (Signed)
Pt presents with c/o migraines on and off for approx 2 weeks. Pt reports some light sensitivity as well. Pt also c/o nausea and some abdominal cramping as well. Pt reports vomiting one time a couple of days ago. NAD at this time, ambulatory to triage.

## 2014-09-16 ENCOUNTER — Emergency Department (HOSPITAL_COMMUNITY)
Admission: EM | Admit: 2014-09-16 | Discharge: 2014-09-16 | Disposition: A | Discharge status: Home / Self Care | Payer: 59 | Attending: Emergency Medicine | Admitting: Emergency Medicine

## 2014-09-16 ENCOUNTER — Encounter (HOSPITAL_COMMUNITY): Payer: Self-pay | Admitting: *Deleted

## 2014-09-16 DIAGNOSIS — Z8619 Personal history of other infectious and parasitic diseases: Secondary | ICD-10-CM | POA: Diagnosis not present

## 2014-09-16 DIAGNOSIS — Z91040 Latex allergy status: Secondary | ICD-10-CM | POA: Diagnosis not present

## 2014-09-16 DIAGNOSIS — D649 Anemia, unspecified: Secondary | ICD-10-CM | POA: Insufficient documentation

## 2014-09-16 DIAGNOSIS — R519 Headache, unspecified: Secondary | ICD-10-CM

## 2014-09-16 DIAGNOSIS — J45909 Unspecified asthma, uncomplicated: Secondary | ICD-10-CM | POA: Insufficient documentation

## 2014-09-16 DIAGNOSIS — R109 Unspecified abdominal pain: Secondary | ICD-10-CM | POA: Diagnosis not present

## 2014-09-16 DIAGNOSIS — Z880 Allergy status to penicillin: Secondary | ICD-10-CM | POA: Diagnosis not present

## 2014-09-16 DIAGNOSIS — Z87440 Personal history of urinary (tract) infections: Secondary | ICD-10-CM | POA: Insufficient documentation

## 2014-09-16 DIAGNOSIS — Z79899 Other long term (current) drug therapy: Secondary | ICD-10-CM | POA: Diagnosis not present

## 2014-09-16 DIAGNOSIS — Z8659 Personal history of other mental and behavioral disorders: Secondary | ICD-10-CM | POA: Insufficient documentation

## 2014-09-16 DIAGNOSIS — R51 Headache: Secondary | ICD-10-CM | POA: Diagnosis not present

## 2014-09-16 LAB — I-STAT BETA HCG BLOOD, ED (MC, WL, AP ONLY): I-stat hCG, quantitative: <5 m[IU]/mL (ref <5)

## 2014-09-16 MED ORDER — IBUPROFEN 600 MG PO TABS: 600.0000 mg | ORAL_TABLET | Freq: Four times a day (QID) | ORAL | Status: DC | PRN

## 2014-09-16 MED ORDER — ACETAMINOPHEN 325 MG PO TABS
650.0000 mg | ORAL_TABLET | Freq: Once | ORAL | Status: AC
  Administered 2014-09-16: 650 mg via ORAL
  Filled 2014-09-16: qty 2

## 2014-09-16 MED ORDER — METOCLOPRAMIDE HCL 10 MG PO TABS
10.0000 mg | ORAL_TABLET | Freq: Once | ORAL | Status: AC
  Administered 2014-09-16: 10 mg via ORAL
  Filled 2014-09-16: qty 1

## 2014-09-16 NOTE — ED Provider Notes (Signed)
CSN: [id number]     Arrival date & time 09/16/14  1404 History   First MD Initiated Contact with Patient 09/16/14 1527     Chief Complaint  Patient presents with  . Headache  . Shoulder Pain     (Consider location/radiation/quality/duration/timing/severity/associated sxs/prior Treatment) HPI Latoya Stark is a 19 y.o. female who reports she is currently pregnant, comes in for evaluation of multiple complaints. Patient states she has had a migraine since June, intermittent abdominal cramping for the past 6 weeks or so, and was also wrestling with her sister last night and noticed that her left shoulder is swollen now. She reports having tried Tylenol and Motrin for her headache that have not helped. She reports associated photophobia. She also has associated nausea. She reports overall discomfort as 7/10. She denies any fevers, chills, vomiting, urinary symptoms, dark or bloody stools, pelvic pain, vaginal bleeding or discharge. Patient reports her last menstrual period was in May, is followed by Dr. Linna Darner with OB/GYN.  Past Medical History  Diagnosis Date  . Asthma   . ADHD (attention deficit hyperactivity disorder)   . Depression   . Headache   . Anemia   . UTI (lower urinary tract infection)   . Anxiety   . Seizures     "when I get too hot"  . Trichomonas infection    Past Surgical History  Procedure Laterality Date  . Orthopedic surgery Left     left hand  . Adenoidectomy    . Dilation and evacuation N/A 08/09/2013    Procedure: DILATATION AND EVACUATION;  Surgeon: Frederico Hamman, MD;  Location: [facility];  Service: Gynecology;  Laterality: N/A;  . Tonsillectomy     Family History  Problem Relation Age of Onset  . Seizures Mother   . Diabetes Maternal Grandmother   . Colon cancer Maternal Grandmother   . Hypertension Maternal Grandmother   . Clotting disorder Maternal Grandmother    Social History  Substance Use Topics  . Smoking status: Never Smoker   . Smokeless  tobacco: Never Used  . Alcohol Use: No   OB History    Gravida Para Term Preterm AB TAB SAB Ectopic Multiple Living   1    1  1         Review of Systems A 10 point review of systems was completed and was negative except for pertinent positives and negatives as mentioned in the history of present illness     Allergies  Milk-related compounds; Mushroom extract complex; Penicillins; Cheese; Latex; and Sulfa antibiotics  Home Medications   Prior to Admission medications   Medication Sig Start Date End Date Taking? Authorizing Provider  acetaminophen (TYLENOL) 500 MG tablet Take 1,000 mg by mouth every 6 (six) hours as needed for headache.   Yes Historical Provider, MD  ibuprofen (ADVIL,MOTRIN) 600 MG tablet Take 600 mg by mouth every 6 (six) hours as needed for fever, headache, moderate pain or cramping.   Yes Historical Provider, MD  Prenat-FeCbn-FeAspGl-FA-Omega (OB COMPLETE PETITE) 35-5-1-200 MG CAPS Take 1 capsule by mouth daily before breakfast. 12/23/13  Yes Shelly Bombard, MD  fluconazole (DIFLUCAN) 150 MG tablet Take 1 tablet (150 mg total) by mouth once. Patient not taking: Reported on 07/20/2014 06/22/14   Shelly Bombard, MD  ibuprofen (ADVIL,MOTRIN) 800 MG tablet Take 1 tablet (800 mg total) by mouth every 8 (eight) hours as needed for cramping. Patient not taking: Reported on 07/20/2014 06/18/14   Shelly Bombard, MD  norgestimate-ethinyl  estradiol (ORTHO-CYCLEN,SPRINTEC,PREVIFEM) 0.25-35 MG-MCG tablet Take 3 tablets per day 3 days then 2 tablets per day 3 days then 1 tablet daily until pack has been completed. Patient not taking: Reported on 06/18/2014 04/28/14   Manya Silvas, CNM  promethazine (PHENERGAN) 25 MG tablet Take 0.5-1 tablets (12.5-25 mg total) by mouth every 6 (six) hours as needed. Patient not taking: Reported on 06/18/2014 03/22/14   Kathie Dike Leftwich-Kirby, CNM  promethazine (PHENERGAN) 25 MG tablet Take 1 tablet (25 mg total) by mouth every 6 (six) hours as needed  for nausea or vomiting. Patient not taking: Reported on 09/16/2014 06/18/14   Shelly Bombard, MD   BP 137/60 mmHg  Pulse 88  Temp(Src) 98.3 F (36.8 C) (Oral)  Resp 15  SpO2 100% Physical Exam  Constitutional: She is oriented to person, place, and time. She appears well-developed and well-nourished. No distress.  Morbidly obese. Patient is laughing with friends in the room throughout entirety of exam and does not appear to be in any distress.  HENT:  Head: Normocephalic and atraumatic.  Mouth/Throat: Oropharynx is clear and moist.  Eyes: Conjunctivae are normal. Pupils are equal, round, and reactive to light. Right eye exhibits no discharge. Left eye exhibits no discharge. No scleral icterus.  Neck: Normal range of motion. Neck supple.  No meningismus or nuchal rigidity.  Cardiovascular: Normal rate, regular rhythm and normal heart sounds.   Pulmonary/Chest: Effort normal and breath sounds normal. No respiratory distress. She has no wheezes. She has no rales.  Abdominal: Soft. She exhibits no distension and no mass. There is no tenderness. There is no rebound and no guarding.  Musculoskeletal: Normal range of motion. She exhibits no edema or tenderness.  No focal tenderness to left shoulder, maintains full active range of motion. Otherwise normal MSK exam.  Neurological: She is alert and oriented to person, place, and time. No cranial nerve deficit. Coordination normal.  Cranial Nerves II-XII grossly intact. Moves all extremities without ataxia. Gait is baseline. No focal neurological deficits.  Skin: Skin is warm and dry. No rash noted. She is not diaphoretic.  Psychiatric: She has a normal mood and affect.  Nursing note and vitals reviewed.   ED Course  Procedures (including critical care time) Labs Review Labs Reviewed  I-STAT BETA HCG BLOOD, ED ([facility], [facility], [facility] ONLY)    Imaging Review No results found. I have personally reviewed and evaluated these images and lab results as part  of my medical decision-making.   EKG Interpretation None     Meds given in ED:  Medications  acetaminophen (TYLENOL) tablet 650 mg (650 mg Oral Given 09/16/14 1712)  metoCLOPramide (REGLAN) tablet 10 mg (10 mg Oral Given 09/16/14 1712)    New Prescriptions   No medications on file   Filed Vitals:   09/16/14 1409 09/16/14 1619 09/16/14 1830  BP: 115/60 119/59 137/60  Pulse: 87 80 88  Temp: 98.3 F (36.8 C)    TempSrc: Oral    Resp: 20 16 15   SpO2: 97% 99% 100%    MDM  Patient here for evaluation of headache since June, intermittent abdominal cramping for the past 3 or 4 weeks and shoulder swelling.  Vitals stable - WNL -afebrile Pt resting comfortably in ED. currently watching a movie on cell phone with friend in ED bed. States that she feels much better after oral Tylenol and Reglan in the ED. HA resolved. No abd pain. States she is ready to go home. PE--benign abdominal, cardiopulmonary exams. Normal neuro  exam, gait is baseline. No objective findings. Labwork-pregnancy is negative.  Low suspicion for other acute or emergent pathology at this time. Patient is in good condition, stable to follow-up with her PCP for reevaluation as needed. I discussed all relevant lab findings and imaging results with pt and they verbalized understanding. Discussed f/u with PCP within 48 hrs and return precautions, pt very amenable to plan.  Final diagnoses:  Nonintractable headache, unspecified chronicity pattern, unspecified headache type  Abdominal cramping       Comer Locket, PA-C 09/16/14 1903  Gareth Morgan, MD 09/17/14 [phone]

## 2014-09-16 NOTE — Discharge Instructions (Signed)
You were evaluated in the ED today for your complaints and there does not appear to be an emergent cause for your symptoms at this time. Her pregnancy test was negative. It is important you to follow-up with your PCP/[facility] for further evaluation and management of your symptoms. Return to ED for new or worsening symptoms. You may take Motrin as needed for discomfort.

## 2014-09-16 NOTE — ED Notes (Addendum)
Pt reports hx of headaches, reports "headache since June". Also reports her left shoulder is swollen, was playing with sister last night and thinks shoulder got injured. Pain 5/10. Reports nausea. Denies vomiting.   Reports she has not gotten her period this month.

## 2014-11-03 ENCOUNTER — Ambulatory Visit: Payer: 59 | Admitting: Obstetrics

## 2014-11-06 ENCOUNTER — Emergency Department: Payer: 59

## 2014-11-06 ENCOUNTER — Emergency Department
Admission: EM | Admit: 2014-11-06 | Discharge: 2014-11-06 | Disposition: A | Discharge status: Home / Self Care | Payer: 59 | Attending: Emergency Medicine | Admitting: Emergency Medicine

## 2014-11-06 ENCOUNTER — Encounter: Payer: Self-pay | Admitting: *Deleted

## 2014-11-06 DIAGNOSIS — Z91040 Latex allergy status: Secondary | ICD-10-CM | POA: Insufficient documentation

## 2014-11-06 DIAGNOSIS — S0001XA Abrasion of scalp, initial encounter: Secondary | ICD-10-CM | POA: Diagnosis not present

## 2014-11-06 DIAGNOSIS — Y998 Other external cause status: Secondary | ICD-10-CM | POA: Diagnosis not present

## 2014-11-06 DIAGNOSIS — Y9389 Activity, other specified: Secondary | ICD-10-CM | POA: Diagnosis not present

## 2014-11-06 DIAGNOSIS — Z880 Allergy status to penicillin: Secondary | ICD-10-CM | POA: Diagnosis not present

## 2014-11-06 DIAGNOSIS — S161XXA Strain of muscle, fascia and tendon at neck level, initial encounter: Secondary | ICD-10-CM

## 2014-11-06 DIAGNOSIS — S0990XA Unspecified injury of head, initial encounter: Secondary | ICD-10-CM | POA: Diagnosis not present

## 2014-11-06 DIAGNOSIS — O9A212 Injury, poisoning and certain other consequences of external causes complicating pregnancy, second trimester: Secondary | ICD-10-CM | POA: Insufficient documentation

## 2014-11-06 DIAGNOSIS — Z3A16 16 weeks gestation of pregnancy: Secondary | ICD-10-CM | POA: Diagnosis not present

## 2014-11-06 DIAGNOSIS — Y92410 Unspecified street and highway as the place of occurrence of the external cause: Secondary | ICD-10-CM | POA: Insufficient documentation

## 2014-11-06 MED ORDER — ACETAMINOPHEN 500 MG PO TABS
1000.0000 mg | ORAL_TABLET | Freq: Once | ORAL | Status: AC
  Administered 2014-11-06: 1000 mg via ORAL
  Filled 2014-11-06: qty 2

## 2014-11-06 MED ORDER — CYCLOBENZAPRINE HCL 10 MG PO TABS: 10.0000 mg | ORAL_TABLET | Freq: Three times a day (TID) | ORAL | Status: DC | PRN
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
## 2014-11-06 NOTE — ED Notes (Signed)
Pt returned from CT at this time.  

## 2014-11-06 NOTE — ED Notes (Signed)
MD Williams at bedside.  

## 2014-11-06 NOTE — ED Notes (Signed)
Patient transported to CT 
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
## 2014-11-08 ENCOUNTER — Emergency Department (HOSPITAL_COMMUNITY)
Admission: EM | Admit: 2014-11-08 | Discharge: 2014-11-08 | Disposition: A | Discharge status: Home / Self Care | Payer: 59 | Attending: Emergency Medicine | Admitting: Emergency Medicine

## 2014-11-08 ENCOUNTER — Encounter (HOSPITAL_COMMUNITY): Payer: Self-pay | Admitting: *Deleted

## 2014-11-08 DIAGNOSIS — Y92410 Unspecified street and highway as the place of occurrence of the external cause: Secondary | ICD-10-CM | POA: Diagnosis not present

## 2014-11-08 DIAGNOSIS — Z3202 Encounter for pregnancy test, result negative: Secondary | ICD-10-CM | POA: Diagnosis not present

## 2014-11-08 DIAGNOSIS — Z8659 Personal history of other mental and behavioral disorders: Secondary | ICD-10-CM | POA: Insufficient documentation

## 2014-11-08 DIAGNOSIS — S299XXA Unspecified injury of thorax, initial encounter: Secondary | ICD-10-CM | POA: Diagnosis not present

## 2014-11-08 DIAGNOSIS — S0990XA Unspecified injury of head, initial encounter: Secondary | ICD-10-CM | POA: Diagnosis not present

## 2014-11-08 DIAGNOSIS — Y9389 Activity, other specified: Secondary | ICD-10-CM | POA: Insufficient documentation

## 2014-11-08 DIAGNOSIS — Y998 Other external cause status: Secondary | ICD-10-CM | POA: Insufficient documentation

## 2014-11-08 DIAGNOSIS — J45909 Unspecified asthma, uncomplicated: Secondary | ICD-10-CM | POA: Insufficient documentation

## 2014-11-08 DIAGNOSIS — Z8619 Personal history of other infectious and parasitic diseases: Secondary | ICD-10-CM | POA: Insufficient documentation

## 2014-11-08 DIAGNOSIS — S199XXA Unspecified injury of neck, initial encounter: Secondary | ICD-10-CM | POA: Diagnosis not present

## 2014-11-08 DIAGNOSIS — Z880 Allergy status to penicillin: Secondary | ICD-10-CM | POA: Diagnosis not present

## 2014-11-08 DIAGNOSIS — Z91040 Latex allergy status: Secondary | ICD-10-CM | POA: Insufficient documentation

## 2014-11-08 DIAGNOSIS — Z79899 Other long term (current) drug therapy: Secondary | ICD-10-CM | POA: Diagnosis not present

## 2014-11-08 DIAGNOSIS — Z87440 Personal history of urinary (tract) infections: Secondary | ICD-10-CM | POA: Insufficient documentation

## 2014-11-08 DIAGNOSIS — Z862 Personal history of diseases of the blood and blood-forming organs and certain disorders involving the immune mechanism: Secondary | ICD-10-CM | POA: Insufficient documentation

## 2014-11-08 LAB — PREGNANCY, URINE: PREG TEST UR: NEGATIVE

## 2014-11-08 MED ORDER — KETOROLAC TROMETHAMINE 30 MG/ML IJ SOLN
30.0000 mg | Freq: Once | INTRAMUSCULAR | Status: AC
  Administered 2014-11-08: 30 mg via INTRAVENOUS
  Filled 2014-11-08: qty 1
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
## 2014-11-08 NOTE — Discharge Instructions (Signed)
You may take Ibuprofen as prescribed over the counter for pain relief.  Follow up with your primary care provider in 1 week. Also follow with your OBGYN at your scheduled appointment this week. Please return to the Emergency Department if symptoms worsen or new onset of fever, numbness, tingling, weakness, abdominal pain, vaginal bleeding.
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
## 2014-11-09 ENCOUNTER — Ambulatory Visit: Payer: 59 | Admitting: Obstetrics

## 2014-11-10 ENCOUNTER — Encounter (HOSPITAL_COMMUNITY): Payer: Self-pay

## 2014-11-10 ENCOUNTER — Emergency Department (HOSPITAL_COMMUNITY)
Admission: EM | Admit: 2014-11-10 | Discharge: 2014-11-11 | Disposition: A | Discharge status: Home / Self Care | Payer: 59 | Attending: Emergency Medicine | Admitting: Emergency Medicine

## 2014-11-10 DIAGNOSIS — G44309 Post-traumatic headache, unspecified, not intractable: Secondary | ICD-10-CM | POA: Insufficient documentation

## 2014-11-10 DIAGNOSIS — Z862 Personal history of diseases of the blood and blood-forming organs and certain disorders involving the immune mechanism: Secondary | ICD-10-CM | POA: Insufficient documentation

## 2014-11-10 DIAGNOSIS — Z8659 Personal history of other mental and behavioral disorders: Secondary | ICD-10-CM | POA: Diagnosis not present

## 2014-11-10 DIAGNOSIS — Z87440 Personal history of urinary (tract) infections: Secondary | ICD-10-CM | POA: Insufficient documentation

## 2014-11-10 DIAGNOSIS — R42 Dizziness and giddiness: Secondary | ICD-10-CM | POA: Insufficient documentation

## 2014-11-10 DIAGNOSIS — J45909 Unspecified asthma, uncomplicated: Secondary | ICD-10-CM | POA: Diagnosis not present

## 2014-11-10 DIAGNOSIS — Z91040 Latex allergy status: Secondary | ICD-10-CM | POA: Diagnosis not present

## 2014-11-10 DIAGNOSIS — Z79899 Other long term (current) drug therapy: Secondary | ICD-10-CM | POA: Diagnosis not present

## 2014-11-10 DIAGNOSIS — Z880 Allergy status to penicillin: Secondary | ICD-10-CM | POA: Diagnosis not present

## 2014-11-10 DIAGNOSIS — M545 Low back pain: Secondary | ICD-10-CM | POA: Insufficient documentation

## 2014-11-10 MED ORDER — KETOROLAC TROMETHAMINE 60 MG/2ML IM SOLN
60.0000 mg | Freq: Once | INTRAMUSCULAR | Status: AC
  Administered 2014-11-11: 60 mg via INTRAMUSCULAR
  Filled 2014-11-10: qty 2

## 2014-11-10 MED ORDER — HYDROCODONE-ACETAMINOPHEN 5-325 MG PO TABS: 2.0000 | ORAL_TABLET | ORAL | Status: DC | PRN
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
## 2014-11-11 NOTE — Discharge Instructions (Signed)
Back Pain, Adult °Back pain is very common in adults. The cause of back pain is rarely dangerous and the pain often gets better over time. The cause of your back pain may not be known. Some common causes of back pain include: °· Strain of the muscles or ligaments supporting the spine. °· Wear and tear (degeneration) of the spinal disks. °· Arthritis. °· Direct injury to the back. °For many people, back pain may return. Since back pain is rarely dangerous, most people can learn to manage this condition on their own. °HOME CARE INSTRUCTIONS °Watch your back pain for any changes. The following actions may help to lessen any discomfort you are feeling: °· Remain active. It is stressful on your back to sit or stand in one place for long periods of time. Do not sit, drive, or stand in one place for more than 30 minutes at a time. Take short walks on even surfaces as soon as you are able. Try to increase the length of time you walk each day. °· Exercise regularly as directed by your health care provider. Exercise helps your back heal faster. It also helps avoid future injury by keeping your muscles strong and flexible. °· Do not stay in bed. Resting more than 1-2 days can delay your recovery. °· Pay attention to your body when you bend and lift. The most comfortable positions are those that put less stress on your recovering back. Always use proper lifting techniques, including: °· Bending your knees. °· Keeping the load close to your body. °· Avoiding twisting. °· Find a comfortable position to sleep. Use a firm mattress and lie on your side with your knees slightly bent. If you lie on your back, put a pillow under your knees. °· Avoid feeling anxious or stressed. Stress increases muscle tension and can worsen back pain. It is important to recognize when you are anxious or stressed and learn ways to manage it, such as with exercise. °· Take medicines only as directed by your health care provider. Over-the-counter  medicines to reduce pain and inflammation are often the most helpful. Your health care provider may prescribe muscle relaxant drugs. These medicines help dull your pain so you can more quickly return to your normal activities and healthy exercise. °· Apply ice to the injured area: °· Put ice in a plastic bag. °· Place a towel between your skin and the bag. °· Leave the ice on for 20 minutes, 2-3 times a day for the first 2-3 days. After that, ice and heat may be alternated to reduce pain and spasms. °· Maintain a healthy weight. Excess weight puts extra stress on your back and makes it difficult to maintain good posture. °SEEK MEDICAL CARE IF: °· You have pain that is not relieved with rest or medicine. °· You have increasing pain going down into the legs or buttocks. °· You have pain that does not improve in one week. °· You have night pain. °· You lose weight. °· You have a fever or chills. °SEEK IMMEDIATE MEDICAL CARE IF:  °· You develop new bowel or bladder control problems. °· You have unusual weakness or numbness in your arms or legs. °· You develop nausea or vomiting. °· You develop abdominal pain. °· You feel faint. °  °This information is not intended to replace advice given to you by your health care provider. Make sure you discuss any questions you have with your health care provider. °  °Document Released: 12/26/2004 Document Revised: 01/16/2014 Document Reviewed: 04/29/2013 °Elsevier Interactive Patient Education ©2016 Elsevier   Inc. Head Injury, Adult You have received a head injury. It does not appear serious at this time. Headaches and vomiting are common following head injury. It should be easy to awaken from sleeping. Sometimes it is necessary for you to stay in the emergency department for a while for observation. Sometimes admission to the hospital may be needed. After injuries such as yours, most problems occur within the first 24 hours, but side effects may occur up to 7-10 days after the  injury. It is important for you to carefully monitor your condition and contact your health care provider or seek immediate medical care if there is a change in your condition. WHAT ARE THE TYPES OF HEAD INJURIES? Head injuries can be as minor as a bump. Some head injuries can be more severe. More severe head injuries include:  A jarring injury to the brain (concussion).  A bruise of the brain (contusion). This mean there is bleeding in the brain that can cause swelling.  A cracked skull (skull fracture).  Bleeding in the brain that collects, clots, and forms a bump (hematoma). WHAT CAUSES A HEAD INJURY? A serious head injury is most likely to happen to someone who is in a car wreck and is not wearing a seat belt. Other causes of major head injuries include bicycle or motorcycle accidents, sports injuries, and falls. HOW ARE HEAD INJURIES DIAGNOSED? A complete history of the event leading to the injury and your current symptoms will be helpful in diagnosing head injuries. Many times, pictures of the brain, such as CT or MRI are needed to see the extent of the injury. Often, an overnight hospital stay is necessary for observation.  WHEN SHOULD I SEEK IMMEDIATE MEDICAL CARE?  You should get help right away if:  You have confusion or drowsiness.  You feel sick to your stomach (nauseous) or have continued, forceful vomiting.  You have dizziness or unsteadiness that is getting worse.  You have severe, continued headaches not relieved by medicine. Only take over-the-counter or prescription medicines for pain, fever, or discomfort as directed by your health care provider.  You do not have normal function of the arms or legs or are unable to walk.  You notice changes in the black spots in the center of the colored part of your eye (pupil).  You have a clear or bloody fluid coming from your nose or ears.  You have a loss of vision. During the next 24 hours after the injury, you must stay with  someone who can watch you for the warning signs. This person should contact local emergency services (911 in the [place] if you have seizures, you become unconscious, or you are unable to wake up. HOW CAN I PREVENT A HEAD INJURY IN THE FUTURE? The most important factor for preventing major head injuries is avoiding motor vehicle accidents. To minimize the potential for damage to your head, it is crucial to wear seat belts while riding in motor vehicles. Wearing helmets while bike riding and playing collision sports (like football) is also helpful. Also, avoiding dangerous activities around the house will further help reduce your risk of head injury.  WHEN CAN I RETURN TO NORMAL ACTIVITIES AND ATHLETICS? You should be reevaluated by your health care provider before returning to these activities. If you have any of the following symptoms, you should not return to activities or contact sports until 1 week after the symptoms have stopped:  Persistent headache.  Dizziness or vertigo.  Poor attention and concentration.  Confusion.  Memory problems.  Nausea or vomiting.  Fatigue or tire easily.  Irritability.  Intolerant of bright lights or loud noises.  Anxiety or depression.  Disturbed sleep. MAKE SURE YOU:   Understand these instructions.  Will watch your condition.  Will get help right away if you are not doing well or get worse.   This information is not intended to replace advice given to you by your health care provider. Make sure you discuss any questions you have with your health care provider.   Document Released: 12/26/2004 Document Revised: 01/16/2014 Document Reviewed: 09/02/2012 Elsevier Interactive Patient Education 2016 [employer].   Emergency Department Resource Guide 1) Find a Doctor and Pay Out of Pocket Although you won't have to find out who is covered by your insurance plan, it is a good idea to ask around and get recommendations. You will then need to call  the office and see if the doctor you have chosen will accept you as a new patient and what types of options they offer for patients who are self-pay. Some doctors offer discounts or will set up payment plans for their patients who do not have insurance, but you will need to ask so you aren't surprised when you get to your appointment.  2) Contact Your Local Health Department Not all health departments have doctors that can see patients for sick visits, but many do, so it is worth a call to see if yours does. If you don't know where your local health department is, you can check in your phone book. The CDC also has a tool to help you locate your state's health department, and many state websites also have listings of all of their local health departments.  3) Find a [facility] If your illness is not likely to be very severe or complicated, you may want to try a walk in clinic. These are popping up all over the country in pharmacies, drugstores, and shopping centers. They're usually staffed by nurse practitioners or physician assistants that have been trained to treat common illnesses and complaints. They're usually fairly quick and inexpensive. However, if you have serious medical issues or chronic medical problems, these are probably not your best option.  No Primary Care Doctor: - Call Health Connect at  [phone] - they can help you locate a primary care doctor that  accepts your insurance, provides certain services, etc. - Physician Referral Service- [phone]  Chronic Pain Problems: Organization         Address  Phone   Notes  [facility]  [phone] Patients need to be referred by their primary care doctor.   Medication Assistance: Organization         Address  Phone   Notes  [facility] Medication [facility] [address] [place], [place] [address] [phone] --Must be a resident of [facility] -- Must have NO insurance  coverage whatsoever (no Medicaid/ Medicare, etc.) -- The pt. MUST have a primary care doctor that directs their care regularly and follows them in the community   MedAssist  [phone]   [employer]  [phone]    Agencies that provide inexpensive medical care: Organization         Address  Phone   Notes  [place]  [phone]   [facility]    [phone]   [facility] [place], [place] [address] [phone]  [facility] [address], [place] [phone]   Planned Parenthood    [phone]   [facility]    [phone]   [place] and [place] [address], [place] Phone:  [phone], Fax:  [phone] Hours of Operation:  9 am - 6 pm, M-F.  Also accepts Medicaid/Medicare and self-pay.  [facility] for [place], [address], [place] Phone: [phone], Fax: [phone]. Hours of Operation:  8:30 am - 5:30 pm, M-F.  Also accepts Medicaid and self-pay.  [facility] [address] [place] Phone: [phone]   [place], [place], [place] [phone], Ext. [phone] Mondays & Thursdays: 7-9 AM.  First 15 patients are seen on a first come, first serve basis.    [place] Providers:  Organization         Address  Phone   Notes  [facility] [address], [place] [phone] Also accepts self-pay patients.  [facility] [address], [place]  [phone]   [place], [address], [place] [phone]   [facility] [address] [place] [phone]   Lucianne Lei [address], [place]   [phone] Only accepts [place] Access [place] patients after they have their  name applied to their card.   Self-Pay (no insurance) in [facility]:  Organization         Address  Phone   Notes  Sickle Cell Patients, [facility] [place] [phone]   [facility] [place] [phone]   [facility] [place]  [place], [place],  [phone]   [facility]/Dr. Osei-Bonsu  [address] [place] or [address], [place] [phone] Phone number for both [place] and [place] locations is the same.  Urgent Medical and [facility] [address] [place] [phone]   [facility] [address] [place] or [address] [phone]   [facility] [address], [place] [phone], phone; [phone], fax Sees patients 1st and 3rd Saturday of every month.  Must not qualify for public or private insurance (i.e. Medicaid, Medicare, [place] Health Choice, Veterans' Benefits)  Household income should be no more than 200% of the poverty level The clinic cannot treat you if you are pregnant or think you are pregnant  Sexually transmitted diseases are not treated at the clinic.    Dental Care: Organization         Address  Phone  Notes  [facility] Department of [facility] [employer] [phone] Accepts children up to age 60 who are enrolled in [place] or [place]; pregnant women with a Medicaid card; and children who have applied for Medicaid or [place] Health Choice, but were declined, whose parents can pay a reduced fee at time of service.  [facility] Department of [facility]  [address], [place] [phone] Accepts children up to age 37 who are enrolled in [place] or [place]; pregnant women with a Medicaid card; and children  who have applied for  Medicaid or [place] Health Choice, but were declined, whose parents can pay a reduced fee at time of service.  [facility] Access PRO[facility]  [place] [phone] Patients are seen by appointment only. Walk-ins are not accepted. [place] will see patients 37 years of age and older. Monday - Tuesday (8am-5pm) Most Wednesdays (8:30-5pm) $30 per visit, cash only  [facility]  [address], [facility] [phone] Patients are seen by appointment only. Walk-ins are not accepted. [place] will see patients 56 years of age and older. One Wednesday Evening (Monthly: Volunteer Based).  $30 per visit, cash only  [place]  [phone] for adults; Children under age 45, call Graduate Pediatric Dentistry at [phone]. Children aged 31-14, please call [phone] to request a pediatric application.  Dental services are provided in all areas of dental care including fillings, crowns and bridges, complete and partial dentures, implants, gum treatment, root canals, and extractions. Preventive care is also provided. Treatment is provided to both adults and children. Patients are selected via a lottery and there is often a waiting list.   [facility] [address] [place]  [phone] [email or web address]   [facility] [address] [place], [place] [phone], Ext. [phone] Second and Fourth Thursday of each month, opens at 6:30 AM; Clinic ends at 9 AM.  Patients are seen on a first-come first-served basis, and a limited number are seen during each clinic.   [facility]  [address] Hillard Danker [place], [place] [phone]   Eligibility Requirements You must have lived in [place], [place], or [place] counties for at least the last three months.   You cannot be eligible for state or federal sponsored [employer], including [employer], [place],  or [employer].   You generally cannot be eligible for healthcare insurance through your employer.    How to apply: Eligibility screenings are held every Tuesday and Wednesday afternoon from 1:00 pm until 4:00 pm. You do not need an appointment for the interview!  [facility] [address], [place], [place]   [place]  [place] Department  [place]  [phone]    Behavioral Health Resources in the Community: Intensive Outpatient Programs Organization         Address  Phone  Notes  [place]. [address], [place], [place] [phone]   [facility] [address] [place], [place]   ADS: Alcohol & Drug Svcs [address], [place], [place]   [place] [address],  [place], [place] or [phone]   Substance Abuse Resources Organization         Address  Phone  Notes  Alcohol and Drug Services  [phone]   [place]  [phone]   [facility] [place]   Chinita Pester  [phone]   Residential & Outpatient Substance Abuse Program  [phone]   Psychological Services Organization         Address  Phone  Notes  [facility]  [place]  [phone]   [place] [address] [place] or [phone]    Mobile Crisis Teams Organization  Address  Phone  Notes  [facility], Mobile Crisis Care Unit  [phone]   [facility]otherapeutic Services  [address] [place], [place]   [facility] [address] [place] [phone]    Self-Help/Support Groups Organization         Address  Phone             Notes  [place]. of [place] - variety of support groups   [place] Call for more information  Narcotics Anonymous (NA), [facility] [address], [employer] [place]  2 meetings at this location   Special educational needs teacher         Address  Phone  Notes  [facility]idential Treatment [place],    [place]  [phone]   [facility]  [address] [place] [address], [place], [place]   [place], [place] [phone] Admissions: 8am-3pm M-F  Incentives Substance [place] [address]    [place], [place] [phone]   [facility] [address] [place], [place], [place]   [facility] [address],  [place], [place]   [facility] Programs - Intensive Outpatient [address], [place], [place]   [facility] [place].,  [place], [place] [phone] or [phone]   [facility] Services (RTS) [address], [place], [place] Accepts Medicaid  [facility] [place] [address]  [place] [phone] Substance Abuse/Addiction Treatment   [facility] Organization         Address  Phone  Notes  [facility]  [phone]   Domenic Schwab, PhD [address] Arlis Port[place], [place]   [phone] or [phone]   [place], [place] [phone]   [facility] [address], [place], [place] [phone] Insurance/Medicaid/sponsorship through [facility] and Families [address] [place]                                    [place], [place] [phone] [place] [address], [place] [phone]    Dr. Adele Schilder  [phone]   [facility] Dept. 1) [address] [place] 2) [place] 3)  [place] [address], [place] [phone]  [phone]   [place] [phone] or [phone] (After Hours)

## 2014-11-11 NOTE — ED Notes (Signed)
Patient left at this time with all belongings. 

## 2014-11-24 ENCOUNTER — Emergency Department (HOSPITAL_COMMUNITY)
Admission: EM | Admit: 2014-11-24 | Discharge: 2014-11-24 | Discharge status: Against Medical Advice | Payer: 59 | Attending: Emergency Medicine | Admitting: Emergency Medicine

## 2014-11-24 ENCOUNTER — Encounter (HOSPITAL_COMMUNITY): Payer: Self-pay | Admitting: Emergency Medicine

## 2014-11-24 DIAGNOSIS — R51 Headache: Secondary | ICD-10-CM | POA: Insufficient documentation

## 2014-11-24 DIAGNOSIS — M545 Low back pain: Secondary | ICD-10-CM | POA: Diagnosis not present

## 2014-11-24 DIAGNOSIS — J45909 Unspecified asthma, uncomplicated: Secondary | ICD-10-CM | POA: Diagnosis not present

## 2014-11-24 NOTE — ED Notes (Signed)
Pt stated she was going to check out and then check back in later because she had to take her grand mother to the doctor at 1400.
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
## 2014-11-25 ENCOUNTER — Emergency Department (HOSPITAL_COMMUNITY)
Admission: EM | Admit: 2014-11-25 | Discharge: 2014-11-25 | Disposition: A | Discharge status: Home / Self Care | Payer: 59 | Attending: Emergency Medicine | Admitting: Emergency Medicine

## 2014-11-25 ENCOUNTER — Encounter (HOSPITAL_COMMUNITY): Payer: Self-pay | Admitting: Emergency Medicine

## 2014-11-25 DIAGNOSIS — J45909 Unspecified asthma, uncomplicated: Secondary | ICD-10-CM | POA: Diagnosis not present

## 2014-11-25 DIAGNOSIS — Z8619 Personal history of other infectious and parasitic diseases: Secondary | ICD-10-CM | POA: Diagnosis not present

## 2014-11-25 DIAGNOSIS — Y92410 Unspecified street and highway as the place of occurrence of the external cause: Secondary | ICD-10-CM | POA: Insufficient documentation

## 2014-11-25 DIAGNOSIS — Z79899 Other long term (current) drug therapy: Secondary | ICD-10-CM | POA: Diagnosis not present

## 2014-11-25 DIAGNOSIS — M545 Low back pain, unspecified: Secondary | ICD-10-CM

## 2014-11-25 DIAGNOSIS — S199XXA Unspecified injury of neck, initial encounter: Secondary | ICD-10-CM | POA: Insufficient documentation

## 2014-11-25 DIAGNOSIS — Z87440 Personal history of urinary (tract) infections: Secondary | ICD-10-CM | POA: Insufficient documentation

## 2014-11-25 DIAGNOSIS — Z91040 Latex allergy status: Secondary | ICD-10-CM | POA: Diagnosis not present

## 2014-11-25 DIAGNOSIS — Y998 Other external cause status: Secondary | ICD-10-CM | POA: Diagnosis not present

## 2014-11-25 DIAGNOSIS — S0990XA Unspecified injury of head, initial encounter: Secondary | ICD-10-CM | POA: Insufficient documentation

## 2014-11-25 DIAGNOSIS — S3992XA Unspecified injury of lower back, initial encounter: Secondary | ICD-10-CM | POA: Insufficient documentation

## 2014-11-25 DIAGNOSIS — Z880 Allergy status to penicillin: Secondary | ICD-10-CM | POA: Insufficient documentation

## 2014-11-25 DIAGNOSIS — Y9389 Activity, other specified: Secondary | ICD-10-CM | POA: Insufficient documentation

## 2014-11-25 DIAGNOSIS — R51 Headache: Secondary | ICD-10-CM

## 2014-11-25 DIAGNOSIS — D649 Anemia, unspecified: Secondary | ICD-10-CM | POA: Diagnosis not present

## 2014-11-25 DIAGNOSIS — Z8659 Personal history of other mental and behavioral disorders: Secondary | ICD-10-CM | POA: Insufficient documentation

## 2014-11-25 DIAGNOSIS — R519 Headache, unspecified: Secondary | ICD-10-CM

## 2014-11-25 MED ORDER — KETOROLAC TROMETHAMINE 30 MG/ML IJ SOLN
30.0000 mg | Freq: Once | INTRAMUSCULAR | Status: AC
  Administered 2014-11-25: 30 mg via INTRAMUSCULAR
  Filled 2014-11-25: qty 1

## 2014-11-25 MED ORDER — HYDROCODONE-ACETAMINOPHEN 5-325 MG PO TABS: 2.0000 | ORAL_TABLET | ORAL | Status: DC | PRN

## 2014-11-25 MED ORDER — IBUPROFEN 800 MG PO TABS: 800.0000 mg | ORAL_TABLET | Freq: Three times a day (TID) | ORAL | Status: DC

## 2014-11-25 NOTE — Discharge Instructions (Signed)
Take your medications as prescribed as needed for pain relief. You may also apply ice for 15-20 minutes to affected areas 3-4 times daily as needed for pain relief. Please follow up with a primary care provider from the Resource Guide provided below in 3-4 days. Please return to the Emergency Department if symptoms worsen or new onset of fever, numbness, tingling, saddle anesthesia, loss of bowel or bladder, weakness.    Emergency Department Resource Guide 1) Find a Doctor and Pay Out of Pocket Although you won't have to find out who is covered by your insurance plan, it is a good idea to ask around and get recommendations. You will then need to call the office and see if the doctor you have chosen will accept you as a new patient and what types of options they offer for patients who are self-pay. Some doctors offer discounts or will set up payment plans for their patients who do not have insurance, but you will need to ask so you aren't surprised when you get to your appointment.  2) Contact Your Local Health Department Not all health departments have doctors that can see patients for sick visits, but many do, so it is worth a call to see if yours does. If you don't know where your local health department is, you can check in your phone book. The CDC also has a tool to help you locate your state's health department, and many state websites also have listings of all of their local health departments.  3) Find a [facility] If your illness is not likely to be very severe or complicated, you may want to try a walk in clinic. These are popping up all over the country in pharmacies, drugstores, and shopping centers. They're usually staffed by nurse practitioners or physician assistants that have been trained to treat common illnesses and complaints. They're usually fairly quick and inexpensive. However, if you have serious medical issues or chronic medical problems, these are probably not your best  option.  No Primary Care Doctor: - Call Health Connect at  [phone] - they can help you locate a primary care doctor that  accepts your insurance, provides certain services, etc. - Physician Referral Service- [phone]  Chronic Pain Problems: Organization         Address  Phone   Notes  [facility]  [phone] Patients need to be referred by their primary care doctor.   Medication Assistance: Organization         Address  Phone   Notes  [facility] Medication [facility] [address] [place], [place] [address] [phone] --Must be a resident of [facility] -- Must have NO insurance coverage whatsoever (no Medicaid/ Medicare, etc.) -- The pt. MUST have a primary care doctor that directs their care regularly and follows them in the community   Med[facility]ist  [phone]   [employer]  [phone]    Agencies that provide inexpensive medical care: Organization         Address  Phone   Notes  [place]  [phone]   [facility]    [phone]   [facility] [place], [place] [address] [phone]   [place] [phone]   Planned Parenthood    [phone]   [facility]    [phone]   Community Health and  [place] [address], [place] Phone:  [phone], Fax:  [phone] Hours of Operation:  9 am - 6 pm, M-F.  Also accepts Medicaid/Medicare and self-pay.  [facility] for [place], [address], [place] Phone: [phone], Fax: [phone]. Hours of Operation:  8:30 am - 5:30 pm, M-F.  Also accepts Medicaid and self-pay.  [facility] [place] [address], [place] Phone: [phone]   [place], [place], [place] [phone], Ext. [phone] Mondays & Thursdays: 7-9 AM.  First 15  patients are seen on a first come, first serve basis.    [place] Providers:  Organization         Address  Phone   Notes  [facility] [address],  [phone] Also accepts self-pay patients.  [facility] [address], [place]  [phone]   [place], [address], [place] [phone]   [facility] [address] [place] [phone]   Lucianne Lei [address], [place]   [phone] Only accepts [place] Access [place] patients after they have their name applied to their card.   Self-Pay (no insurance) in [facility]:  Organization         Address  Phone   Notes  Sickle Cell Patients, [facility] [place] [phone]   [facility] [place] [phone]   [facility] [place]  [place], [place],  [phone]   [facility]/Dr. Osei-Bonsu  [address] [place] or [address], [place] [phone] Phone number for both [place] and [place] locations is the same.  [facility] [address] [place] [phone]   [facility] [address] [place] or [address] [phone]   [facility] [address], [place] [phone], phone; [phone], fax Sees patients 1st and 3rd Saturday of every month.  Must not qualify for public or private insurance (i.e. Medicaid, Medicare, [place] Health Choice, Veterans' Benefits)  Household income should be no more than 200% of the poverty level The clinic cannot treat you if you are pregnant or think you are pregnant  Sexually transmitted diseases are not treated at the clinic.    Dental  Care: Organization         Address  Phone  Notes  [facility] Department of [facility] [place] [phone] Accepts children up to age 59 who are enrolled in [place] or [place]; pregnant women with a Medicaid card; and children who have applied for Medicaid or [place] Health Choice, but were declined, whose parents can pay a reduced fee at time of service.  [facility] Department of [facility]  [address], [place] [phone] Accepts children up to age 80 who are enrolled in [place] or [place]; pregnant women with a Medicaid card; and children who have applied for Medicaid or [place] Health Choice, but were declined, whose parents can pay a reduced fee at time of service.  [place] Adult [facility] Access PROGRAM  [place],  [place] [phone] Patients are seen by appointment only. Walk-ins are not accepted. [place] will see patients 51 years of age and older. Monday - Tuesday (8am-5pm) Most Wednesdays (8:30-5pm) $30 per visit, cash only  [facility] Adult Dental Access PROGRAM  [address], [facility] [phone] Patients are seen by appointment only. Walk-ins are not accepted. [place] will see patients 14 years of age and older. One Wednesday Evening (Monthly: Volunteer Based).  $30 per visit, cash only  [place]  [phone] for adults; Children under age 72, call Graduate Pediatric Dentistry at [phone]. Children aged 20-14, please call [phone] to request a pediatric application.  Dental services are provided in all areas of dental care including fillings, crowns and bridges, complete and partial dentures, implants, gum treatment, root canals, and extractions. Preventive care is also provided. Treatment is provided to both adults and children. Patients are selected via a lottery and there is often a waiting list.   [facility] [address], [place]  [phone] [email or web address]   [facility] [address] [place], [place] [phone], Ext. [phone] Second and Fourth Thursday of each month, opens at 6:30 AM; Clinic ends at 9 AM.  Patients are seen on a first-come first-served basis, and a limited number are seen during each clinic.   [facility]  [address] Hillard Danker [place], [place] [phone]   Eligibility Requirements You must have lived in [place], [place], or [place] counties for at least the last three months.   You cannot be eligible for state or federal sponsored [employer], including [employer], [place], or [employer].   You generally cannot be eligible for healthcare insurance through your employer.    How to apply: Eligibility screenings are held every Tuesday and Wednesday afternoon from 1:00 pm until 4:00 pm. You do not need an appointment for the interview!  [facility] [address] [place], [place]   [place]  [place] Department  [place]  [phone]    Behavioral Health Resources in the Community: Intensive Outpatient Programs Organization         Address  Phone  Notes  [place]. [address] [place], [place] [phone]   [facility] [address] [place], [place]   ADS: Alcohol & Drug Svcs [address] [place], [place]   [place] [address]  [place], [place] or [phone]   Substance Abuse Resources Organization         Address  Phone  Notes  Alcohol and Drug Services  [phone]   [place]  [phone]   [facility] [place]   Chinita Pester  [phone]   Residential & Outpatient Substance Abuse Program  [phone]    Psychological Services Organization         Address  Phone  Notes  [facility] [place]  [place]  [phone]   [place] [address] [place] or [phone]    Mobile Crisis Teams Organization         Address  Phone  Notes  [facility], Mobile Crisis Care Unit  [phone]   [facility]  [address] [place], [place]   [place] [phone]  [address] [place] [phone]    Self-Help/Support Groups Organization         Address  Phone             Notes  [facility]ental [facility] Assoc. of [place] - variety of support groups  [place] Call for more information  Narcotics Anonymous (NA), [facility]aring [facility] [address], [employer] [place]  2 meetings at this location   Special educational needs teacher         Address  Phone  Notes  [facility] [place],    [place]  [phone]   [facility]  [address] [place] [id number], [place], [place]   [place], [place] [phone] Admissions: 8am-3pm M-F  Incentives Substance [place] [address],    [place], [place] [phone]   [facility] [address] [place], [place], [place]   [facility] [address]  [place], [place]   [facility] - Intensive Outpatient [address] Kristeen [address], [place], [place]   [facility] ([place].) [place].,  [place], [place] [phone] or [phone]   [facility] (RTS) [address] [place], [place] Accepts Medicaid  [facility] [address],  [place] [phone] Substance Abuse/Addiction Treatment   [facility] Organization         Address  Phone  Notes  [facility]  [phone]   Domenic Schwab, PhD [address] Arlis Porta [place], [place]   [phone] or [phone]   [place], [place] [phone]   [facility] [address] [place], [place] [phone] Insurance/Medicaid/sponsorship through [facility] and Families [address] [place]                                    [place], [place] [phone] [place] [address], [place] [phone]    Dr. Adele Schilder  [phone]   [facility] Dept. 1) [address] [place] 2) [place] 3)  [place] [address], [place] [phone]  [phone]   [place] [phone] or [phone] (After Hours)
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
## 2014-11-25 NOTE — ED Notes (Addendum)
PA at bedside.

## 2014-12-29 ENCOUNTER — Encounter: Payer: Self-pay | Admitting: Emergency Medicine

## 2014-12-29 ENCOUNTER — Emergency Department
Admission: EM | Admit: 2014-12-29 | Discharge: 2014-12-29 | Disposition: A | Discharge status: Home / Self Care | Payer: 59 | Attending: Emergency Medicine | Admitting: Emergency Medicine

## 2014-12-29 DIAGNOSIS — Z3202 Encounter for pregnancy test, result negative: Secondary | ICD-10-CM | POA: Diagnosis not present

## 2014-12-29 DIAGNOSIS — G44209 Tension-type headache, unspecified, not intractable: Secondary | ICD-10-CM | POA: Diagnosis not present

## 2014-12-29 DIAGNOSIS — Z793 Long term (current) use of hormonal contraceptives: Secondary | ICD-10-CM | POA: Insufficient documentation

## 2014-12-29 DIAGNOSIS — Z79899 Other long term (current) drug therapy: Secondary | ICD-10-CM | POA: Insufficient documentation

## 2014-12-29 DIAGNOSIS — Z7952 Long term (current) use of systemic steroids: Secondary | ICD-10-CM | POA: Diagnosis not present

## 2014-12-29 DIAGNOSIS — R51 Headache: Secondary | ICD-10-CM | POA: Diagnosis present

## 2014-12-29 LAB — POCT PREGNANCY, URINE: PREG TEST UR: NEGATIVE

## 2014-12-29 MED ORDER — PREDNISONE 20 MG PO TABS: 40.0000 mg | ORAL_TABLET | Freq: Every day | ORAL | Status: AC

## 2014-12-29 MED ORDER — PROMETHAZINE HCL 25 MG PO TABS: 25.0000 mg | ORAL_TABLET | Freq: Four times a day (QID) | ORAL | Status: DC | PRN

## 2014-12-29 NOTE — Discharge Instructions (Signed)
Tension Headache A tension headache is pain, pressure, or aching that is felt over the front and sides of your head. These headaches can last from 30 minutes to several days. HOME CARE Managing Pain  Take over-the-counter and prescription medicines only as told by your doctor.  Lie down in a dark, quiet room when you have a headache.  If directed, apply ice to your head and neck area:  Put ice in a plastic bag.  Place a towel between your skin and the bag.  Leave the ice on for 20 minutes, 2-3 times per day.  Use a heating pad or a hot shower to apply heat to your head and neck area as told by your doctor. Eating and Drinking  Eat meals on a regular schedule.  Do not drink a lot of alcohol.  Do not use a lot of caffeine, or stop using caffeine. General Instructions  Keep all follow-up visits as told by your doctor. This is important.  Keep a journal to find out if certain things bring on headaches. For example, write down:  What you eat and drink.  How much sleep you get.  Any change to your diet or medicines.  Try getting a massage, or doing other things that help you to relax.  Lessen stress.  Sit up straight. Do not tighten (tense) your muscles.  Do not use tobacco products. This includes cigarettes, chewing tobacco, or e-cigarettes. If you need help quitting, ask your doctor.  Exercise regularly as told by your doctor.  Get enough sleep. This may mean 7-9 hours of sleep. GET HELP IF:  Your symptoms are not helped by medicine.  You have a headache that feels different from your usual headache.  You feel sick to your stomach (nauseous) or you throw up (vomit).  You have a fever. GET HELP RIGHT AWAY IF:  Your headache becomes very bad.  You keep throwing up.  You have a stiff neck.  You have trouble seeing.  You have trouble speaking.  You have pain in your eye or ear.  Your muscles are weak or you lose muscle control.  You lose your balance  or you have trouble walking.  You feel like you will pass out (faint) or you pass out.  You have confusion.   This information is not intended to replace advice given to you by your health care provider. Make sure you discuss any questions you have with your health care provider.   Document Released: 03/22/2009 Document Revised: 09/16/2014 Document Reviewed: 04/20/2014 Elsevier Interactive Patient Education 2016 Elsevier Inc.  
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
## 2015-01-10 HISTORY — PX: DILATION AND EVACUATION: SHX1459

## 2015-01-29 ENCOUNTER — Other Ambulatory Visit: Payer: Self-pay | Admitting: *Deleted

## 2015-01-29 MED ORDER — OB COMPLETE PETITE 35-5-1-200 MG PO CAPS: 1.0000 | ORAL_CAPSULE | Freq: Every day | ORAL | Status: DC

## 2015-01-29 NOTE — Progress Notes (Signed)
Pt request refill on PNV, refill was sent to pharmacy. Pt also would like to come in for STD testing prior to AEX appt.  Pt was scheduled for 02/04/15 for testing.

## 2015-02-04 ENCOUNTER — Encounter: Payer: Self-pay | Admitting: Obstetrics

## 2015-02-04 ENCOUNTER — Ambulatory Visit (INDEPENDENT_AMBULATORY_CARE_PROVIDER_SITE_OTHER): Payer: Medicaid Other | Admitting: Obstetrics

## 2015-02-04 VITALS — BP 113/63 | HR 52 | Wt 281.0 lb

## 2015-02-04 DIAGNOSIS — N760 Acute vaginitis: Secondary | ICD-10-CM | POA: Diagnosis not present

## 2015-02-04 DIAGNOSIS — Z3202 Encounter for pregnancy test, result negative: Secondary | ICD-10-CM

## 2015-02-04 DIAGNOSIS — A499 Bacterial infection, unspecified: Secondary | ICD-10-CM | POA: Diagnosis not present

## 2015-02-04 DIAGNOSIS — R1115 Cyclical vomiting syndrome unrelated to migraine: Secondary | ICD-10-CM

## 2015-02-04 DIAGNOSIS — G43A0 Cyclical vomiting, not intractable: Secondary | ICD-10-CM

## 2015-02-04 DIAGNOSIS — Z113 Encounter for screening for infections with a predominantly sexual mode of transmission: Secondary | ICD-10-CM | POA: Diagnosis not present

## 2015-02-04 DIAGNOSIS — IMO0002 Reserved for concepts with insufficient information to code with codable children: Secondary | ICD-10-CM

## 2015-02-04 DIAGNOSIS — B9689 Other specified bacterial agents as the cause of diseases classified elsewhere: Secondary | ICD-10-CM

## 2015-02-04 LAB — POCT URINALYSIS DIPSTICK
Bilirubin, UA: NEGATIVE
GLUCOSE UA: NEGATIVE
Ketones, UA: NEGATIVE
NITRITE UA: NEGATIVE
PROTEIN UA: NEGATIVE
RBC UA: NEGATIVE
UROBILINOGEN UA: NEGATIVE
pH, UA: 6

## 2015-02-04 LAB — POCT URINE PREGNANCY: Preg Test, Ur: NEGATIVE

## 2015-02-04 MED ORDER — PROMETHAZINE HCL 6.25 MG/5ML PO SYRP: 25.0000 mg | ORAL_SOLUTION | Freq: Four times a day (QID) | ORAL | Status: DC | PRN

## 2015-02-04 MED ORDER — CLINDAMYCIN HCL 300 MG PO CAPS: 300.0000 mg | ORAL_CAPSULE | Freq: Three times a day (TID) | ORAL | Status: DC

## 2015-02-04 NOTE — Progress Notes (Signed)
Patient ID: Latoya Stark, female   DOB: [birth year], 20 y.o.   MRN: [id number]  Chief Complaint  Patient presents with  . Follow-up    std testing, pt would like UPT today    HPI Latoya Stark is a 20 y.o. female.  Vaginal discharge with odor and irritation. HPI  Past Medical History  Diagnosis Date  . Asthma   . ADHD (attention deficit hyperactivity disorder)   . Depression   . Headache   . Anemia   . UTI (lower urinary tract infection)   . Anxiety   . Seizures ([place])     "when I get too hot"  . Trichomonas infection     Past Surgical History  Procedure Laterality Date  . Orthopedic surgery Left     left hand  . Adenoidectomy    . Dilation and evacuation N/A 08/09/2013    Procedure: DILATATION AND EVACUATION;  Surgeon: Frederico Hamman, MD;  Location: [facility];  Service: Gynecology;  Laterality: N/A;  . Tonsillectomy      Family History  Problem Relation Age of Onset  . Seizures Mother   . Diabetes Maternal Grandmother   . Colon cancer Maternal Grandmother   . Hypertension Maternal Grandmother   . Clotting disorder Maternal Grandmother     Social History Social History  Substance Use Topics  . Smoking status: Passive Smoke Exposure - Never Smoker  . Smokeless tobacco: Never Used  . Alcohol Use: No    Allergies  Allergen Reactions  . Milk-Related Compounds Anaphylaxis    Breaks face out *can have 2% milk*  . Mushroom Extract Complex Anaphylaxis  . Penicillins Anaphylaxis    Throat swells.   [name] Other (See Comments)    Breaks face out  . Latex Itching and Swelling  . Sulfa Antibiotics Swelling    Current Outpatient Prescriptions  Medication Sig Dispense Refill  . acetaminophen (TYLENOL) 500 MG tablet Take 1,000 mg by mouth every 6 (six) hours as needed for headache.    . cyclobenzaprine (FLEXERIL) 10 MG tablet Take 1 tablet (10 mg total) by mouth every 8 (eight) hours as needed for muscle spasms. 30 tablet 1  . ibuprofen (ADVIL,MOTRIN) 800 MG  tablet Take 1 tablet (800 mg total) by mouth 3 (three) times daily. 21 tablet 0  . Prenat-FeCbn-FeAspGl-FA-Omega (OB COMPLETE PETITE) 35-5-1-200 MG CAPS Take 1 capsule by mouth daily before breakfast. 30 capsule 11  . promethazine (PHENERGAN) 25 MG tablet Take 1 tablet (25 mg total) by mouth every 6 (six) hours as needed for nausea or vomiting. 10 tablet 0  . clindamycin (CLEOCIN) 300 MG capsule Take 1 capsule (300 mg total) by mouth 3 (three) times daily. 14 capsule 2  . promethazine (PHENERGAN) 6.25 MG/5ML syrup Take 20 mLs (25 mg total) by mouth every 6 (six) hours as needed for nausea or vomiting. 473 mL 2   No current facility-administered medications for this visit.    Review of Systems Review of Systems Constitutional: negative for fatigue and weight loss Respiratory: negative for cough and wheezing Cardiovascular: negative for chest pain, fatigue and palpitations Gastrointestinal: negative for abdominal pain and change in bowel habits Genitourinary: vaginal discharge Integument/breast: negative for nipple discharge Musculoskeletal:negative for myalgias Neurological: negative for gait problems and tremors Behavioral/Psych: negative for abusive relationship, depression Endocrine: negative for temperature intolerance     Blood pressure 113/63, pulse 52, weight 281 lb (127.461 kg), last menstrual period 01/15/2015, unknown if currently breastfeeding.  Physical Exam Physical Exam General:  alert  Skin:   no rash or abnormalities  Lungs:   clear to auscultation bilaterally  Heart:   regular rate and rhythm, S1, S2 normal, no murmur, click, rub or gallop  Breasts:   normal without suspicious masses, skin or nipple changes or axillary nodes  Abdomen:  normal findings: no organomegaly, soft, non-tender and no hernia  Pelvis:  External genitalia: normal general appearance Urinary system: urethral meatus normal and bladder without fullness, nontender Vaginal: normal without  tenderness, induration or masses Cervix: normal appearance Adnexa: normal bimanual exam Uterus: anteverted and non-tender, normal size      Data Reviewed Labs  Assessment     BV Screening for STD     Plan    Wet prep and cultures sent F/^ in 6 months for annual and pap   Orders Placed This Encounter  Procedures  . SureSwab, Vaginosis/Vaginitis Plus  . POCT urine pregnancy  . POCT urinalysis dipstick   Meds ordered this encounter  Medications  . promethazine (PHENERGAN) 6.25 MG/5ML syrup    Sig: Take 20 mLs (25 mg total) by mouth every 6 (six) hours as needed for nausea or vomiting.    Dispense:  473 mL    Refill:  2  . clindamycin (CLEOCIN) 300 MG capsule    Sig: Take 1 capsule (300 mg total) by mouth 3 (three) times daily.    Dispense:  14 capsule    Refill:  2

## 2015-02-09 ENCOUNTER — Other Ambulatory Visit: Payer: Self-pay | Admitting: Obstetrics

## 2015-02-09 DIAGNOSIS — A5901 Trichomonal vulvovaginitis: Secondary | ICD-10-CM

## 2015-02-09 DIAGNOSIS — B373 Candidiasis of vulva and vagina: Secondary | ICD-10-CM

## 2015-02-09 DIAGNOSIS — B3731 Acute candidiasis of vulva and vagina: Secondary | ICD-10-CM

## 2015-02-09 LAB — SURESWAB, VAGINOSIS/VAGINITIS PLUS
Atopobium vaginae: 6.5 Log (cells/mL)
BV CATEGORY: UNDETERMINED — AB
C. TROPICALIS, DNA: NOT DETECTED
C. albicans, DNA: DETECTED — AB
C. glabrata, DNA: NOT DETECTED
C. parapsilosis, DNA: NOT DETECTED
C. trachomatis RNA, TMA: NOT DETECTED
Gardnerella vaginalis: >8 Log (cells/mL)
LACTOBACILLUS SPECIES: 5 Log (cells/mL)
MEGASPHAERA SPECIES: 7.1 Log (cells/mL)
N. GONORRHOEAE RNA, TMA: NOT DETECTED
T. VAGINALIS RNA, QL TMA: DETECTED — AB

## 2015-02-09 MED ORDER — TINIDAZOLE 500 MG PO TABS: 2.0000 g | ORAL_TABLET | Freq: Every day | ORAL | Status: DC

## 2015-02-09 MED ORDER — FLUCONAZOLE 150 MG PO TABS: 150.0000 mg | ORAL_TABLET | Freq: Once | ORAL | Status: DC

## 2015-02-11 ENCOUNTER — Emergency Department (HOSPITAL_COMMUNITY)
Admission: EM | Admit: 2015-02-11 | Discharge: 2015-02-11 | Disposition: A | Discharge status: Home / Self Care | Payer: Medicaid Other | Attending: Emergency Medicine | Admitting: Emergency Medicine

## 2015-02-11 ENCOUNTER — Encounter (HOSPITAL_COMMUNITY): Payer: Self-pay

## 2015-02-11 DIAGNOSIS — Z3202 Encounter for pregnancy test, result negative: Secondary | ICD-10-CM | POA: Diagnosis not present

## 2015-02-11 DIAGNOSIS — Z880 Allergy status to penicillin: Secondary | ICD-10-CM | POA: Diagnosis not present

## 2015-02-11 DIAGNOSIS — J45909 Unspecified asthma, uncomplicated: Secondary | ICD-10-CM | POA: Diagnosis not present

## 2015-02-11 DIAGNOSIS — Z91040 Latex allergy status: Secondary | ICD-10-CM | POA: Insufficient documentation

## 2015-02-11 DIAGNOSIS — R109 Unspecified abdominal pain: Secondary | ICD-10-CM | POA: Diagnosis not present

## 2015-02-11 DIAGNOSIS — N644 Mastodynia: Secondary | ICD-10-CM | POA: Insufficient documentation

## 2015-02-11 DIAGNOSIS — Z8659 Personal history of other mental and behavioral disorders: Secondary | ICD-10-CM | POA: Diagnosis not present

## 2015-02-11 DIAGNOSIS — Z792 Long term (current) use of antibiotics: Secondary | ICD-10-CM | POA: Insufficient documentation

## 2015-02-11 DIAGNOSIS — Z791 Long term (current) use of non-steroidal anti-inflammatories (NSAID): Secondary | ICD-10-CM | POA: Insufficient documentation

## 2015-02-11 DIAGNOSIS — Z79899 Other long term (current) drug therapy: Secondary | ICD-10-CM | POA: Insufficient documentation

## 2015-02-11 DIAGNOSIS — D649 Anemia, unspecified: Secondary | ICD-10-CM | POA: Diagnosis not present

## 2015-02-11 DIAGNOSIS — Z8619 Personal history of other infectious and parasitic diseases: Secondary | ICD-10-CM | POA: Insufficient documentation

## 2015-02-11 DIAGNOSIS — Z87440 Personal history of urinary (tract) infections: Secondary | ICD-10-CM | POA: Diagnosis not present

## 2015-02-11 LAB — HCG, QUANTITATIVE, PREGNANCY: hCG, Beta Chain, Quant, S: 1 m[IU]/mL (ref <5)

## 2015-02-11 NOTE — ED Notes (Signed)
Pt presents for pregnancy test, reports LMP was around 01/19/15.  Pt took home pregnancy test that was negative x 2-3 weeks ago; reports nausea, breast tenderness and umbilical pain that began yesterday.

## 2015-02-11 NOTE — ED Provider Notes (Signed)
CSN: [id number]     Arrival date & time 02/11/15  1408 History  By signing my name below, I, Altamease Oiler, attest that this documentation has been prepared under the direction and in the presence of [employer]. Electronically Signed: Altamease Oiler, ED Scribe. 02/11/2015 4:03 PM   Chief Complaint  Patient presents with  . Abdominal Pain   The history is provided by the patient. No language interpreter was used.   Latoya Stark is a 20 y.o. female who presents to the Emergency Department for a pregnancy test. LNMP was 01/19/15. Pt is concerned for pregnancy due to mild tightness in her abdomen and tenderness at her breasts. She denies other complaint.    Past Medical History  Diagnosis Date  . Asthma   . ADHD (attention deficit hyperactivity disorder)   . Depression   . Headache   . Anemia   . UTI (lower urinary tract infection)   . Anxiety   . Seizures ([place])     "when I get too hot"  . Trichomonas infection    Past Surgical History  Procedure Laterality Date  . Orthopedic surgery Left     left hand  . Adenoidectomy    . Dilation and evacuation N/A 08/09/2013    Procedure: DILATATION AND EVACUATION;  Surgeon: Frederico Hamman, MD;  Location: [facility];  Service: Gynecology;  Laterality: N/A;  . Tonsillectomy     Family History  Problem Relation Age of Onset  . Seizures Mother   . Diabetes Maternal Grandmother   . Colon cancer Maternal Grandmother   . Hypertension Maternal Grandmother   . Clotting disorder Maternal Grandmother    Social History  Substance Use Topics  . Smoking status: Passive Smoke Exposure - Never Smoker  . Smokeless tobacco: Never Used  . Alcohol Use: No   OB History    Gravida Para Term Preterm AB TAB SAB Ectopic Multiple Living   2    1  1         Review of Systems  Gastrointestinal: Positive for abdominal pain.  Genitourinary:       Breast pain   Allergies  Milk-related compounds; Mushroom extract complex; Penicillins; Cheese;  Latex; and Sulfa antibiotics  Home Medications   Prior to Admission medications   Medication Sig Start Date End Date Taking? Authorizing Provider  acetaminophen (TYLENOL) 500 MG tablet Take 1,000 mg by mouth every 6 (six) hours as needed for headache.    Historical Provider, MD  clindamycin (CLEOCIN) 300 MG capsule Take 1 capsule (300 mg total) by mouth 3 (three) times daily. 02/04/15   Shelly Bombard, MD  cyclobenzaprine (FLEXERIL) 10 MG tablet Take 1 tablet (10 mg total) by mouth every 8 (eight) hours as needed for muscle spasms. 11/06/14 11/06/15  Earleen Newport, MD  fluconazole (DIFLUCAN) 150 MG tablet Take 1 tablet (150 mg total) by mouth once. 02/09/15   Shelly Bombard, MD  ibuprofen (ADVIL,MOTRIN) 800 MG tablet Take 1 tablet (800 mg total) by mouth 3 (three) times daily. 11/25/14   Nona Dell, PA-C  Prenat-FeCbn-FeAspGl-FA-Omega (OB COMPLETE PETITE) 35-5-1-200 MG CAPS Take 1 capsule by mouth daily before breakfast. 01/29/15   Shelly Bombard, MD  promethazine (PHENERGAN) 25 MG tablet Take 1 tablet (25 mg total) by mouth every 6 (six) hours as needed for nausea or vomiting. 12/29/14   Harvest Dark, PA-C  promethazine (PHENERGAN) 6.25 MG/5ML syrup Take 20 mLs (25 mg total) by mouth every 6 (six) hours  as needed for nausea or vomiting. 02/04/15   Shelly Bombard, MD  tinidazole (TINDAMAX) 500 MG tablet Take 4 tablets (2,000 mg total) by mouth daily with breakfast. 02/09/15   Shelly Bombard, MD   BP 129/81 mmHg  Pulse 85  Temp(Src) 98.4 F (36.9 C) (Oral)  Resp 18  Ht 5\' 3"  (1.6 m)  Wt 220 lb (99.791 kg)  BMI 38.98 kg/m2  SpO2 99%  LMP 01/19/2015 (Approximate) Physical Exam  Constitutional: She is oriented to person, place, and time. She appears well-developed and well-nourished. No distress.  HENT:  Head: Normocephalic and atraumatic.  Eyes: Conjunctivae and EOM are normal.  Neck: Neck supple. No tracheal deviation present.  Cardiovascular: Normal rate.    Pulmonary/Chest: Effort normal. No respiratory distress.  Musculoskeletal: Normal range of motion.  Neurological: She is alert and oriented to person, place, and time.  Skin: Skin is warm and dry.  Psychiatric: She has a normal mood and affect. Her behavior is normal.  Nursing note and vitals reviewed.   ED Course  Procedures (including critical care time) DIAGNOSTIC STUDIES: Oxygen Saturation is 99% on RA,  normal by my interpretation.    COORDINATION OF CARE: 4:01 PM Discussed treatment plan which includes lab work with pt at bedside and pt agreed to plan.  Labs Review Labs Reviewed  HCG, QUANTITATIVE, PREGNANCY    Imaging Review No results found. I have personally reviewed and evaluated lab results as part of my medical decision-making.   EKG Interpretation None      MDM   Final diagnoses:  Breast tenderness in female  Abdominal discomfort    Pt concerned that she could possibly be pregnant due to breast tenderness and mild abdominal discomfort. She has no other complaint at this time. Pregnancy test is negative. Pt is to f/u with her PCP.   I personally performed the services described in this documentation, which was scribed in my presence. The recorded information has been reviewed and is accurate.    Margarita Mail, PA-C 02/11/15 [place], MD 02/12/15 [phone]

## 2015-02-11 NOTE — Discharge Instructions (Signed)
Breast Tenderness Breast tenderness is a common problem for women of all ages. Breast tenderness may cause mild discomfort to severe pain. It has a variety of causes. Your health care provider will find out the likely cause of your breast tenderness by examining your breasts, asking you about symptoms, and ordering some tests. Breast tenderness usually does not mean you have breast cancer. HOME CARE INSTRUCTIONS  Breast tenderness often can be handled at home. You can try:  Getting fitted for a new bra that provides more support, especially during exercise.  Wearing a more supportive bra or sports bra while sleeping when your breasts are very tender.  If you have a breast injury, apply ice to the area:  Put ice in a plastic bag.  Place a towel between your skin and the bag.  Leave the ice on for 20 minutes, 2-3 times a day.  If your breasts are too full of milk as a result of breastfeeding, try:  Expressing milk either by hand or with a breast pump.  Applying a warm compress to the breasts for relief.  Taking over-the-counter pain relievers, if approved by your health care provider.  Taking other medicines that your health care provider prescribes. These may include antibiotic medicines or birth control pills. Over the long term, your breast tenderness might be eased if you:  Cut down on caffeine.  Reduce the amount of fat in your diet. Keep a log of the days and times when your breasts are most tender. This will help you and your health care provider find the cause of the tenderness and how to relieve it. Also, learn how to do breast exams at home. This will help you notice if you have an unusual growth or lump that could cause tenderness. SEEK MEDICAL CARE IF:   Any part of your breast is hard, red, and hot to the touch. This could be a sign of infection.  Fluid is coming out of your nipples (and you are not breastfeeding). Especially watch for blood or pus.  You have a fever  as well as breast tenderness.  You have a new or painful lump in your breast that remains after your menstrual period ends.  You have tried to take care of the pain at home, but it has not gone away.  Your breast pain is getting worse, or the pain is making it hard to do the things you usually do during your day.   This information is not intended to replace advice given to you by your health care provider. Make sure you discuss any questions you have with your health care provider.   Document Released: 12/09/2007 Document Revised: 08/28/2012 Document Reviewed: 07/25/2012 Elsevier Interactive Patient Education 2016 [place].  Abdominal Pain, Adult Many things can cause abdominal pain. Usually, abdominal pain is not caused by a disease and will improve without treatment. It can often be observed and treated at home. Your health care provider will do a physical exam and possibly order blood tests and X-rays to help determine the seriousness of your pain. However, in many cases, more time must pass before a clear cause of the pain can be found. Before that point, your health care provider may not know if you need more testing or further treatment. HOME CARE INSTRUCTIONS Monitor your abdominal pain for any changes. The following actions may help to alleviate any discomfort you are experiencing:  Only take over-the-counter or prescription medicines as directed by your health care provider.  Do  not take laxatives unless directed to do so by your health care provider.  Try a clear liquid diet (broth, tea, or water) as directed by your health care provider. Slowly move to a bland diet as tolerated. SEEK MEDICAL CARE IF:  You have unexplained abdominal pain.  You have abdominal pain associated with nausea or diarrhea.  You have pain when you urinate or have a bowel movement.  You experience abdominal pain that wakes you in the night.  You have abdominal pain that is worsened or improved  by eating food.  You have abdominal pain that is worsened with eating fatty foods.  You have a fever. SEEK IMMEDIATE MEDICAL CARE IF:  Your pain does not go away within 2 hours.  You keep throwing up (vomiting).  Your pain is felt only in portions of the abdomen, such as the right side or the left lower portion of the abdomen.  You pass bloody or black tarry stools. MAKE SURE YOU:  Understand these instructions.  Will watch your condition.  Will get help right away if you are not doing well or get worse.   This information is not intended to replace advice given to you by your health care provider. Make sure you discuss any questions you have with your health care provider.   Document Released: 10/05/2004 Document Revised: 09/16/2014 Document Reviewed: 09/04/2012 Elsevier Interactive Patient Education [employer].

## 2015-02-12 ENCOUNTER — Other Ambulatory Visit: Payer: Self-pay | Admitting: *Deleted

## 2015-02-12 DIAGNOSIS — N760 Acute vaginitis: Principal | ICD-10-CM

## 2015-02-12 DIAGNOSIS — B9689 Other specified bacterial agents as the cause of diseases classified elsewhere: Secondary | ICD-10-CM

## 2015-02-12 DIAGNOSIS — A599 Trichomoniasis, unspecified: Secondary | ICD-10-CM

## 2015-02-12 MED ORDER — METRONIDAZOLE 500 MG PO TABS: 2000.0000 mg | ORAL_TABLET | Freq: Once | ORAL | Status: DC

## 2015-02-12 NOTE — Progress Notes (Signed)
See lab note.  

## 2015-03-08 ENCOUNTER — Ambulatory Visit: Payer: 59 | Admitting: Obstetrics

## 2015-03-22 ENCOUNTER — Emergency Department (HOSPITAL_COMMUNITY)
Admission: EM | Admit: 2015-03-22 | Discharge: 2015-03-22 | Disposition: A | Discharge status: Home / Self Care | Payer: 59

## 2015-03-22 ENCOUNTER — Emergency Department (INDEPENDENT_AMBULATORY_CARE_PROVIDER_SITE_OTHER)
Admission: EM | Admit: 2015-03-22 | Discharge: 2015-03-22 | Disposition: A | Discharge status: Home / Self Care | Payer: Medicaid Other | Source: Home / Self Care | Attending: Emergency Medicine | Admitting: Emergency Medicine

## 2015-03-22 ENCOUNTER — Emergency Department (INDEPENDENT_AMBULATORY_CARE_PROVIDER_SITE_OTHER): Payer: Medicaid Other

## 2015-03-22 ENCOUNTER — Encounter (HOSPITAL_COMMUNITY): Payer: Self-pay | Admitting: *Deleted

## 2015-03-22 DIAGNOSIS — S8991XA Unspecified injury of right lower leg, initial encounter: Secondary | ICD-10-CM

## 2015-03-22 LAB — POCT PREGNANCY, URINE: PREG TEST UR: NEGATIVE
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
## 2015-03-22 NOTE — Discharge Instructions (Signed)
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
## 2015-03-26 ENCOUNTER — Encounter (HOSPITAL_COMMUNITY): Payer: Self-pay | Admitting: Emergency Medicine

## 2015-03-26 ENCOUNTER — Emergency Department (HOSPITAL_COMMUNITY)
Admission: EM | Admit: 2015-03-26 | Discharge: 2015-03-26 | Disposition: A | Discharge status: Home / Self Care | Payer: Medicaid Other | Attending: Emergency Medicine | Admitting: Emergency Medicine

## 2015-03-26 ENCOUNTER — Emergency Department (HOSPITAL_COMMUNITY): Payer: Medicaid Other

## 2015-03-26 DIAGNOSIS — Z8619 Personal history of other infectious and parasitic diseases: Secondary | ICD-10-CM | POA: Diagnosis not present

## 2015-03-26 DIAGNOSIS — W228XXA Striking against or struck by other objects, initial encounter: Secondary | ICD-10-CM | POA: Insufficient documentation

## 2015-03-26 DIAGNOSIS — Z791 Long term (current) use of non-steroidal anti-inflammatories (NSAID): Secondary | ICD-10-CM | POA: Diagnosis not present

## 2015-03-26 DIAGNOSIS — Z79899 Other long term (current) drug therapy: Secondary | ICD-10-CM | POA: Insufficient documentation

## 2015-03-26 DIAGNOSIS — Z87440 Personal history of urinary (tract) infections: Secondary | ICD-10-CM | POA: Insufficient documentation

## 2015-03-26 DIAGNOSIS — Y998 Other external cause status: Secondary | ICD-10-CM | POA: Diagnosis not present

## 2015-03-26 DIAGNOSIS — Y9289 Other specified places as the place of occurrence of the external cause: Secondary | ICD-10-CM | POA: Insufficient documentation

## 2015-03-26 DIAGNOSIS — M25561 Pain in right knee: Secondary | ICD-10-CM

## 2015-03-26 DIAGNOSIS — Z91040 Latex allergy status: Secondary | ICD-10-CM | POA: Insufficient documentation

## 2015-03-26 DIAGNOSIS — Z862 Personal history of diseases of the blood and blood-forming organs and certain disorders involving the immune mechanism: Secondary | ICD-10-CM | POA: Insufficient documentation

## 2015-03-26 DIAGNOSIS — Z792 Long term (current) use of antibiotics: Secondary | ICD-10-CM | POA: Diagnosis not present

## 2015-03-26 DIAGNOSIS — Z8659 Personal history of other mental and behavioral disorders: Secondary | ICD-10-CM | POA: Diagnosis not present

## 2015-03-26 DIAGNOSIS — Z880 Allergy status to penicillin: Secondary | ICD-10-CM | POA: Diagnosis not present

## 2015-03-26 DIAGNOSIS — J45909 Unspecified asthma, uncomplicated: Secondary | ICD-10-CM | POA: Diagnosis not present

## 2015-03-26 DIAGNOSIS — S8991XA Unspecified injury of right lower leg, initial encounter: Secondary | ICD-10-CM | POA: Insufficient documentation

## 2015-03-26 DIAGNOSIS — Y9389 Activity, other specified: Secondary | ICD-10-CM | POA: Diagnosis not present

## 2015-03-26 MED ORDER — IBUPROFEN 800 MG PO TABS: 800.0000 mg | ORAL_TABLET | Freq: Three times a day (TID) | ORAL | Status: DC

## 2015-03-26 MED ORDER — TRAMADOL HCL 50 MG PO TABS: 50.0000 mg | ORAL_TABLET | Freq: Four times a day (QID) | ORAL | Status: DC | PRN

## 2015-03-26 MED ORDER — OXYCODONE-ACETAMINOPHEN 5-325 MG PO TABS
2.0000 | ORAL_TABLET | Freq: Once | ORAL | Status: AC
  Administered 2015-03-26: 2 via ORAL
  Filled 2015-03-26: qty 2

## 2015-03-26 NOTE — Discharge Instructions (Signed)
RICE for Routine Care of Injuries Theroutine careofmanyinjuriesincludes rest, ice, compression, and elevation (RICE therapy). RICE therapy is often recommended for injuries to soft tissues, such as a muscle strain, ligament injuries, bruises, and overuse injuries. It can also be used for some bony injuries. Using RICE therapy can help to relieve pain, lessen swelling, and enable your body to heal. Rest Rest is required to allow your body to heal. This usually involves reducing your normal activities and avoiding use of the injured part of your body. Generally, you can return to your normal activities when you are comfortable and have been given permission by your health care provider. Ice Icing your injury helps to keep the swelling down, and it lessens pain. Do not apply ice directly to your skin.  Put ice in a plastic bag.  Place a towel between your skin and the bag.  Leave the ice on for 20 minutes, 2-3 times a day. Do this for as long as you are directed by your health care provider. Compression Compression means putting pressure on the injured area. Compression helps to keep swelling down, gives support, and helps with discomfort. Compression may be done with an elastic bandage. If an elastic bandage has been applied, follow these general tips:  Remove and reapply the bandage every 3-4 hours or as directed by your health care provider.  Make sure the bandage is not wrapped too tightly, because this can cut off circulation. If part of your body beyond the bandage becomes blue, numb, cold, swollen, or more painful, your bandage is most likely too tight. If this occurs, remove your bandage and reapply it more loosely.  See your health care provider if the bandage seems to be making your problems worse rather than better. Elevation Elevation means keeping the injured area raised. This helps to lessen swelling and decrease pain. If possible, your injured area should be elevated at or  above the level of your heart or the center of your chest. [place]? You should seek medical care if:  Your pain and swelling continue.  Your symptoms are getting worse rather than improving. These symptoms may indicate that further evaluation or further X-rays are needed. Sometimes, X-rays may not show a small broken bone (fracture) until a number of days later. Make a follow-up appointment with your health care provider. WHEN SHOULD I SEEK IMMEDIATE MEDICAL CARE? You should seek immediate medical care if:  You have sudden severe pain at or below the area of your injury.  You have redness or increased swelling around your injury.  You have tingling or numbness at or below the area of your injury that does not improve after you remove the elastic bandage.   This information is not intended to replace advice given to you by your health care provider. Make sure you discuss any questions you have with your health care provider.   Document Released: 04/09/2000 Document Revised: 09/16/2014 Document Reviewed: 12/03/2013 Elsevier Interactive Patient Education 2016 Elsevier Inc.   Cryotherapy Cryotherapy is when you put ice on your injury. Ice helps lessen pain and puffiness (swelling) after an injury. Ice works the best when you start using it in the first 24 to 48 hours after an injury. HOME CARE  Put a dry or damp towel between the ice pack and your skin.  You may press gently on the ice pack.  Leave the ice on for no more than 10 to 20 minutes at a time.  Check your  skin after 5 minutes to make sure your skin is okay.  Rest at least 20 minutes between ice pack uses.  Stop using ice when your skin loses feeling (numbness).  Do not use ice on someone who cannot tell you when it hurts. This includes small children and people with memory problems (dementia). GET HELP RIGHT AWAY IF:  You have white spots on your skin.  Your skin turns blue or pale.  Your  skin feels waxy or hard.  Your puffiness gets worse. MAKE SURE YOU:   Understand these instructions.  Will watch your condition.  Will get help right away if you are not doing well or get worse.   This information is not intended to replace advice given to you by your health care provider. Make sure you discuss any questions you have with your health care provider.   Document Released: 06/14/2007 Document Revised: 03/20/2011 Document Reviewed: 08/18/2010 Elsevier Interactive Patient Education [employer].
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
## 2015-03-26 NOTE — ED Notes (Signed)
Patient transported to X-ray 

## 2015-03-26 NOTE — ED Notes (Signed)
Pt has a ride home.  

## 2015-04-01 ENCOUNTER — Ambulatory Visit: Payer: Medicaid Other | Admitting: Certified Nurse Midwife

## 2015-04-02 ENCOUNTER — Ambulatory Visit (INDEPENDENT_AMBULATORY_CARE_PROVIDER_SITE_OTHER): Payer: Medicaid Other | Admitting: Obstetrics

## 2015-04-02 ENCOUNTER — Encounter: Payer: Self-pay | Admitting: Obstetrics

## 2015-04-02 VITALS — BP 106/67 | HR 77 | Temp 98.3°F | Wt 285.0 lb

## 2015-04-02 DIAGNOSIS — Z3009 Encounter for other general counseling and advice on contraception: Secondary | ICD-10-CM

## 2015-04-02 DIAGNOSIS — Z3169 Encounter for other general counseling and advice on procreation: Secondary | ICD-10-CM

## 2015-04-03 ENCOUNTER — Encounter: Payer: Self-pay | Admitting: Obstetrics

## 2015-04-03 NOTE — Progress Notes (Signed)
Patient ID: Latoya Stark, female   DOB: 1995/10/29, 20 y.o.   MRN: [id number]  Chief Complaint  Patient presents with  . Advice Only    Preconception Counseling     HPI Latoya Stark is a 20 y.o. female.  Patient presents for preconception counseling.  HPI  Past Medical History  Diagnosis Date  . Asthma   . ADHD (attention deficit hyperactivity disorder)   . Depression   . Headache   . Anemia   . UTI (lower urinary tract infection)   . Anxiety   . Seizures ([place])     "when I get too hot"  . Trichomonas infection     Past Surgical History  Procedure Laterality Date  . Orthopedic surgery Left     left hand  . Adenoidectomy    . Dilation and evacuation N/A 08/09/2013    Procedure: DILATATION AND EVACUATION;  Surgeon: Frederico Hamman, MD;  Location: [facility];  Service: Gynecology;  Laterality: N/A;  . Tonsillectomy      Family History  Problem Relation Age of Onset  . Seizures Mother   . Diabetes Maternal Grandmother   . Colon cancer Maternal Grandmother   . Hypertension Maternal Grandmother   . Clotting disorder Maternal Grandmother     Social History Social History  Substance Use Topics  . Smoking status: Passive Smoke Exposure - Never Smoker  . Smokeless tobacco: Never Used  . Alcohol Use: No    Allergies  Allergen Reactions  . Milk-Related Compounds Anaphylaxis    Breaks face out *can have 2% milk*  . Mushroom Extract Complex Anaphylaxis  . Penicillins Anaphylaxis    Throat swells.   [name] Other (See Comments)    Breaks face out  . Latex Itching and Swelling  . Sulfa Antibiotics Swelling    Current Outpatient Prescriptions  Medication Sig Dispense Refill  . acetaminophen (TYLENOL) 500 MG tablet Take 1,000 mg by mouth every 6 (six) hours as needed for headache.    . ibuprofen (ADVIL,MOTRIN) 800 MG tablet Take 1 tablet (800 mg total) by mouth 3 (three) times daily. 21 tablet 0  . Prenat-FeCbn-FeAspGl-FA-Omega (OB COMPLETE PETITE) 35-5-1-200 MG  CAPS Take 1 capsule by mouth daily before breakfast. 30 capsule 11  . promethazine (PHENERGAN) 25 MG tablet Take 1 tablet (25 mg total) by mouth every 6 (six) hours as needed for nausea or vomiting. 10 tablet 0  . promethazine (PHENERGAN) 6.25 MG/5ML syrup Take 20 mLs (25 mg total) by mouth every 6 (six) hours as needed for nausea or vomiting. 473 mL 2  . traMADol (ULTRAM) 50 MG tablet Take 1 tablet (50 mg total) by mouth every 6 (six) hours as needed for severe pain. 15 tablet 0   No current facility-administered medications for this visit.    Review of Systems Review of Systems Constitutional: negative for fatigue and weight loss Respiratory: negative for cough and wheezing Cardiovascular: negative for chest pain, fatigue and palpitations Gastrointestinal: negative for abdominal pain and change in bowel habits Genitourinary:negative Integument/breast: negative for nipple discharge Musculoskeletal:negative for myalgias Neurological: negative for gait problems and tremors Behavioral/Psych: negative for abusive relationship, depression Endocrine: negative for temperature intolerance     Blood pressure 106/67, pulse 77, temperature 98.3 F (36.8 C), weight 285 lb (129.275 kg), last menstrual period 03/03/2015.  Physical Exam Physical Exam:  Deferred  100% of 15 min visit spent on counseling and coordination of care.   Data Reviewed Labs  Assessment     Preconception counseling     Plan    PNV's Rx Preconception counseling and advice given. F/U prn  No orders of the defined types were placed in this encounter.   No orders of the defined types were placed in this encounter.

## 2015-04-07 ENCOUNTER — Ambulatory Visit: Payer: Medicaid Other | Admitting: Family Medicine

## 2015-05-17 ENCOUNTER — Emergency Department: Payer: Medicaid Other

## 2015-05-17 ENCOUNTER — Encounter: Payer: Self-pay | Admitting: Emergency Medicine

## 2015-05-17 ENCOUNTER — Emergency Department
Admission: EM | Admit: 2015-05-17 | Discharge: 2015-05-17 | Disposition: A | Discharge status: Home / Self Care | Payer: Medicaid Other | Attending: Emergency Medicine | Admitting: Emergency Medicine

## 2015-05-17 DIAGNOSIS — J45909 Unspecified asthma, uncomplicated: Secondary | ICD-10-CM | POA: Diagnosis not present

## 2015-05-17 DIAGNOSIS — Z8669 Personal history of other diseases of the nervous system and sense organs: Secondary | ICD-10-CM | POA: Diagnosis not present

## 2015-05-17 DIAGNOSIS — B9689 Other specified bacterial agents as the cause of diseases classified elsewhere: Secondary | ICD-10-CM

## 2015-05-17 DIAGNOSIS — Z7722 Contact with and (suspected) exposure to environmental tobacco smoke (acute) (chronic): Secondary | ICD-10-CM | POA: Diagnosis not present

## 2015-05-17 DIAGNOSIS — R102 Pelvic and perineal pain: Secondary | ICD-10-CM | POA: Insufficient documentation

## 2015-05-17 DIAGNOSIS — Z79899 Other long term (current) drug therapy: Secondary | ICD-10-CM | POA: Diagnosis not present

## 2015-05-17 DIAGNOSIS — R1032 Left lower quadrant pain: Secondary | ICD-10-CM | POA: Diagnosis present

## 2015-05-17 DIAGNOSIS — F909 Attention-deficit hyperactivity disorder, unspecified type: Secondary | ICD-10-CM | POA: Diagnosis not present

## 2015-05-17 DIAGNOSIS — N760 Acute vaginitis: Secondary | ICD-10-CM | POA: Insufficient documentation

## 2015-05-17 DIAGNOSIS — Z791 Long term (current) use of non-steroidal anti-inflammatories (NSAID): Secondary | ICD-10-CM | POA: Diagnosis not present

## 2015-05-17 DIAGNOSIS — F329 Major depressive disorder, single episode, unspecified: Secondary | ICD-10-CM | POA: Diagnosis not present

## 2015-05-17 LAB — COMPREHENSIVE METABOLIC PANEL
ALBUMIN: 4.1 g/dL (ref 3.5–5.0)
ALK PHOS: 88 U/L (ref 38–126)
ALT: 17 U/L (ref 14–54)
AST: 21 U/L (ref 15–41)
Anion gap: 7 (ref 5–15)
BILIRUBIN TOTAL: 0.6 mg/dL (ref 0.3–1.2)
BUN: 9 mg/dL (ref 6–20)
CALCIUM: 8.9 mg/dL (ref 8.9–10.3)
CO2: 26 mmol/L (ref 22–32)
CREATININE: 0.73 mg/dL (ref 0.44–1.00)
Chloride: 105 mmol/L (ref 101–111)
GFR calc Af Amer: >60 mL/min (ref >60)
GFR calc non Af Amer: >60 mL/min (ref >60)
GLUCOSE: 90 mg/dL (ref 65–99)
Potassium: 3.7 mmol/L (ref 3.5–5.1)
SODIUM: 138 mmol/L (ref 135–145)
Total Protein: 7.5 g/dL (ref 6.5–8.1)

## 2015-05-17 LAB — WET PREP, GENITAL
Sperm: NONE SEEN
Trich, Wet Prep: NONE SEEN
Yeast Wet Prep HPF POC: NONE SEEN

## 2015-05-17 LAB — CBC
HCT: 39 % (ref 35.0–47.0)
Hemoglobin: 13.2 g/dL (ref 12.0–16.0)
MCH: 28.7 pg (ref 26.0–34.0)
MCHC: 33.8 g/dL (ref 32.0–36.0)
MCV: 84.9 fL (ref 80.0–100.0)
PLATELETS: 280 10*3/uL (ref 150–440)
RBC: 4.59 MIL/uL (ref 3.80–5.20)
RDW: 14.4 % (ref 11.5–14.5)
WBC: 4.4 10*3/uL (ref 3.6–11.0)

## 2015-05-17 LAB — URINALYSIS COMPLETE WITH MICROSCOPIC (ARMC ONLY)
BILIRUBIN URINE: NEGATIVE
Bacteria, UA: NONE SEEN
GLUCOSE, UA: NEGATIVE mg/dL
Hgb urine dipstick: NEGATIVE
KETONES UR: NEGATIVE mg/dL
Leukocytes, UA: NEGATIVE
Nitrite: NEGATIVE
PROTEIN: NEGATIVE mg/dL
Specific Gravity, Urine: 1.016 (ref 1.005–1.030)
pH: 6 (ref 5.0–8.0)

## 2015-05-17 LAB — LIPASE, BLOOD: Lipase: 19 U/L (ref 11–51)

## 2015-05-17 LAB — CHLAMYDIA/NGC RT PCR (ARMC ONLY)
CHLAMYDIA TR: NOT DETECTED
N GONORRHOEAE: NOT DETECTED

## 2015-05-17 LAB — POCT PREGNANCY, URINE: PREG TEST UR: NEGATIVE

## 2015-05-17 MED ORDER — ONDANSETRON HCL 4 MG PO TABS: 4.0000 mg | ORAL_TABLET | Freq: Every day | ORAL | Status: DC | PRN

## 2015-05-17 MED ORDER — METRONIDAZOLE 500 MG PO TABS: 500.0000 mg | ORAL_TABLET | Freq: Two times a day (BID) | ORAL | Status: AC

## 2015-05-17 MED ORDER — METRONIDAZOLE 500 MG PO TABS
500.0000 mg | ORAL_TABLET | Freq: Once | ORAL | Status: AC
  Administered 2015-05-17: 500 mg via ORAL
  Filled 2015-05-17: qty 1

## 2015-05-17 NOTE — Discharge Instructions (Signed)
Bacterial Vaginosis Bacterial vaginosis is a vaginal infection that occurs when the normal balance of bacteria in the vagina is disrupted. It results from an overgrowth of certain bacteria. This is the most common vaginal infection in women of childbearing age. Treatment is important to prevent complications, especially in pregnant women, as it can cause a premature delivery. CAUSES  Bacterial vaginosis is caused by an increase in harmful bacteria that are normally present in smaller amounts in the vagina. Several different kinds of bacteria can cause bacterial vaginosis. However, the reason that the condition develops is not fully understood. RISK FACTORS Certain activities or behaviors can put you at an increased risk of developing bacterial vaginosis, including:  Having a new sex partner or multiple sex partners.  Douching.  Using an intrauterine device (IUD) for contraception. Women do not get bacterial vaginosis from toilet seats, bedding, swimming pools, or contact with objects around them. SIGNS AND SYMPTOMS  Some women with bacterial vaginosis have no signs or symptoms. Common symptoms include:  Grey vaginal discharge.  A fishlike odor with discharge, especially after sexual intercourse.  Itching or burning of the vagina and vulva.  Burning or pain with urination. DIAGNOSIS  Your health care provider will take a medical history and examine the vagina for signs of bacterial vaginosis. A sample of vaginal fluid may be taken. Your health care provider will look at this sample under a microscope to check for bacteria and abnormal cells. A vaginal pH test may also be done.  TREATMENT  Bacterial vaginosis may be treated with antibiotic medicines. These may be given in the form of a pill or a vaginal cream. A second round of antibiotics may be prescribed if the condition comes back after treatment. Because bacterial vaginosis increases your risk for sexually transmitted diseases, getting  treated can help reduce your risk for chlamydia, gonorrhea, HIV, and herpes. HOME CARE INSTRUCTIONS   Only take over-the-counter or prescription medicines as directed by your health care provider.  If antibiotic medicine was prescribed, take it as directed. Make sure you finish it even if you start to feel better.  Tell all sexual partners that you have a vaginal infection. They should see their health care provider and be treated if they have problems, such as a mild rash or itching.  During treatment, it is important that you follow these instructions:  Avoid sexual activity or use condoms correctly.  Do not douche.  Avoid alcohol as directed by your health care provider.  Avoid breastfeeding as directed by your health care provider. SEEK MEDICAL CARE IF:   Your symptoms are not improving after 3 days of treatment.  You have increased discharge or pain.  You have a fever. MAKE SURE YOU:   Understand these instructions.  Will watch your condition.  Will get help right away if you are not doing well or get worse. FOR MORE INFORMATION  Centers for Disease Control and Prevention, Division of STD Prevention: [employer] American Sexual Health Association (ASHA): www.ashastd.org    This information is not intended to replace advice given to you by your health care provider. Make sure you discuss any questions you have with your health care provider.   Document Released: 12/26/2004 Document Revised: 01/16/2014 Document Reviewed: 08/07/2012 Elsevier Interactive Patient Education 2016 Elsevier Inc.  Pelvic Pain, Female Female pelvic pain can be caused by many different things and start from a variety of places. Pelvic pain refers to pain that is located in the lower half of the abdomen and  between your hips. The pain may occur over a short period of time (acute) or may be reoccurring (chronic). The cause of pelvic pain may be related to disorders affecting the female  reproductive organs (gynecologic), but it may also be related to the bladder, kidney stones, an intestinal complication, or muscle or skeletal problems. Getting help right away for pelvic pain is important, especially if there has been severe, sharp, or a sudden onset of unusual pain. It is also important to get help right away because some types of pelvic pain can be life threatening.  CAUSES  Below are only some of the causes of pelvic pain. The causes of pelvic pain can be in one of several categories.   Gynecologic.  Pelvic inflammatory disease.  Sexually transmitted infection.  Ovarian cyst or a twisted ovarian ligament (ovarian torsion).  Uterine lining that grows outside the uterus (endometriosis).  Fibroids, cysts, or tumors.  Ovulation.  Pregnancy.  Pregnancy that occurs outside the uterus (ectopic pregnancy).  Miscarriage.  Labor.  Abruption of the placenta or ruptured uterus.  Infection.  Uterine infection (endometritis).  Bladder infection.  Diverticulitis.  Miscarriage related to a uterine infection (septic abortion).  Bladder.  Inflammation of the bladder (cystitis).  Kidney stone(s).  Gastrointestinal.  Constipation.  Diverticulitis.  Neurologic.  Trauma.  Feeling pelvic pain because of mental or emotional causes (psychosomatic).  Cancers of the bowel or pelvis. EVALUATION  Your caregiver will want to take a careful history of your concerns. This includes recent changes in your health, a careful gynecologic history of your periods (menses), and a sexual history. Obtaining your family history and medical history is also important. Your caregiver may suggest a pelvic exam. A pelvic exam will help identify the location and severity of the pain. It also helps in the evaluation of which organ system may be involved. In order to identify the cause of the pelvic pain and be properly treated, your caregiver may order tests. These tests may include:    A pregnancy test.  Pelvic ultrasonography.  An X-ray exam of the abdomen.  A urinalysis or evaluation of vaginal discharge.  Blood tests. HOME CARE INSTRUCTIONS   Only take over-the-counter or prescription medicines for pain, discomfort, or fever as directed by your caregiver.   Rest as directed by your caregiver.   Eat a balanced diet.   Drink enough fluids to make your urine clear or pale yellow, or as directed.   Avoid sexual intercourse if it causes pain.   Apply warm or cold compresses to the lower abdomen depending on which one helps the pain.   Avoid stressful situations.   Keep a journal of your pelvic pain. Write down when it started, where the pain is located, and if there are things that seem to be associated with the pain, such as food or your menstrual cycle.  Follow up with your caregiver as directed.  SEEK MEDICAL CARE IF:  Your medicine does not help your pain.  You have abnormal vaginal discharge. SEEK IMMEDIATE MEDICAL CARE IF:   You have heavy bleeding from the vagina.   Your pelvic pain increases.   You feel light-headed or faint.   You have chills.   You have pain with urination or blood in your urine.   You have uncontrolled diarrhea or vomiting.   You have a fever or persistent symptoms for more than 3 days.  You have a fever and your symptoms suddenly get worse.   You are being physically or  sexually abused.   This information is not intended to replace advice given to you by your health care provider. Make sure you discuss any questions you have with your health care provider.   Document Released: 11/23/2003 Document Revised: 09/16/2014 Document Reviewed: 04/17/2011 Elsevier Interactive Patient Education [employer].

## 2015-05-17 NOTE — ED Notes (Signed)
Resting in bed, await MD review results and disposition.

## 2015-05-17 NOTE — ED Provider Notes (Signed)
[facility] Emergency Department Provider Note   ____________________________________________  Time seen: Approximately 720AM  I have reviewed the triage vital signs and the nursing notes.   HISTORY  Chief Complaint Abdominal Pain; Nausea; and Urinary Frequency    HPI Latoya Stark is a 20 y.o. female with a history of Trichomonas who is presenting today with left lower quadrant pain that has been ongoing over the past 2 weeks. She says the pain is intermittent, cramping and sharp until last night when it became constant. She has had nausea but no vomiting. No diarrhea. Says that her last period was in late March and she has taken one negative pregnancy test at home. Says the pain is an 8 out of 10 at this time. Also describes urinary frequency but no burning. Denies any vaginal discharge or bleeding.   Past Medical History  Diagnosis Date  . Asthma   . ADHD (attention deficit hyperactivity disorder)   . Depression   . Headache   . Anemia   . UTI (lower urinary tract infection)   . Anxiety   . Seizures ([place])     "when I get too hot"  . Trichomonas infection     Patient Active Problem List   Diagnosis Date Noted  . Constipation 02/24/2014  . CN (constipation) 12/23/2013  . Gonorrhea in female 10/05/2013  . BV (bacterial vaginosis) 09/22/2013  . Candidiasis of vulva and vagina 09/22/2013    Past Surgical History  Procedure Laterality Date  . Orthopedic surgery Left     left hand  . Adenoidectomy    . Dilation and evacuation N/A 08/09/2013    Procedure: DILATATION AND EVACUATION;  Surgeon: Frederico Hamman, MD;  Location: [facility];  Service: Gynecology;  Laterality: N/A;  . Tonsillectomy      Current Outpatient Rx  Name  Route  Sig  Dispense  Refill  . acetaminophen (TYLENOL) 500 MG tablet   Oral   Take 1,000 mg by mouth every 6 (six) hours as needed for headache.         . ibuprofen (ADVIL,MOTRIN) 800 MG tablet   Oral   Take 1  tablet (800 mg total) by mouth 3 (three) times daily.   21 tablet   0   . Prenat-FeCbn-FeAspGl-FA-Omega (OB COMPLETE PETITE) 35-5-1-200 MG CAPS   Oral   Take 1 capsule by mouth daily before breakfast.   30 capsule   11   . promethazine (PHENERGAN) 25 MG tablet   Oral   Take 1 tablet (25 mg total) by mouth every 6 (six) hours as needed for nausea or vomiting.   10 tablet   0     Dispense as written.   . promethazine (PHENERGAN) 6.25 MG/5ML syrup   Oral   Take 20 mLs (25 mg total) by mouth every 6 (six) hours as needed for nausea or vomiting.   473 mL   2   . traMADol (ULTRAM) 50 MG tablet   Oral   Take 1 tablet (50 mg total) by mouth every 6 (six) hours as needed for severe pain.   15 tablet   0     Allergies Milk-related compounds; Mushroom extract complex; Penicillins; Cheese; Latex; and Sulfa antibiotics  Family History  Problem Relation Age of Onset  . Seizures Mother   . Diabetes Maternal Grandmother   . Colon cancer Maternal Grandmother   . Hypertension Maternal Grandmother   . Clotting disorder Maternal Grandmother     Social History  Social History  Substance Use Topics  . Smoking status: Passive Smoke Exposure - Never Smoker  . Smokeless tobacco: Never Used  . Alcohol Use: No    Review of Systems Constitutional: No fever/chills Eyes: No visual changes. ENT: No sore throat. Cardiovascular: Denies chest pain. Respiratory: Denies shortness of breath. Gastrointestinal:  no vomiting.  No diarrhea.  No constipation. Genitourinary: Urinary frequency Musculoskeletal: Negative for back pain. Skin: Negative for rash. Neurological: Negative for headaches, focal weakness or numbness.  10-point ROS otherwise negative.  ____________________________________________   PHYSICAL EXAM:  VITAL SIGNS: ED Triage Vitals  Enc Vitals Group     BP 05/17/15 0654 125/77 mmHg     Pulse Rate 05/17/15 0654 81     Resp 05/17/15 0654 18     Temp 05/17/15 0654 98.2  F (36.8 C)     Temp Source 05/17/15 0654 Oral     SpO2 05/17/15 0654 100 %     Weight 05/17/15 0654 220 lb (99.791 kg)     Height 05/17/15 0654 5\' 3"  (1.6 m)     Head Cir --      Peak Flow --      Pain Score 05/17/15 0655 8     Pain Loc --      Pain Edu? --      Excl. in [place]? --     Constitutional: Alert and oriented. Well appearing and in no acute distress. Eyes: Conjunctivae are normal. PERRL. EOMI. Head: Atraumatic. Nose: No congestion/rhinnorhea. Mouth/Throat: Mucous membranes are moist.  Neck: No stridor.   Cardiovascular: Normal rate, regular rhythm. Grossly normal heart sounds.  Good peripheral circulation. Respiratory: Normal respiratory effort.  No retractions. Lungs CTAB. Gastrointestinal: Soft With mild to moderate tenderness across the lower abdomen which is greatest in the left lower quadrant. No distention. No abdominal bruits. No CVA tenderness. Genitourinary:  Normal external appearance. Speculum exam with a very small amount of white discharge. Normal-appearing cervix. Bimanual exam without cervical motion tenderness. Mild uterine as well as left adnexal tenderness without any palpable masses. No tenderness to the right adnexal region. Exam done with nursing chaperone, Maudie Mercury, at the bedside. Musculoskeletal: No lower extremity tenderness nor edema.  No joint effusions. Neurologic:  Normal speech and language. No gross focal neurologic deficits are appreciated. No gait instability. Skin:  Skin is warm, dry and intact. No rash noted. Psychiatric: Mood and affect are normal. Speech and behavior are normal.  ____________________________________________   LABS (all labs ordered are listed, but only abnormal results are displayed)  Labs Reviewed  WET PREP, GENITAL - Abnormal; Notable for the following:    Clue Cells Wet Prep HPF POC PRESENT (*)    WBC, Wet Prep HPF POC FEW (*)    All other components within normal limits  URINALYSIS COMPLETEWITH MICROSCOPIC ([facility]  ONLY) - Abnormal; Notable for the following:    Color, Urine YELLOW (*)    APPearance CLEAR (*)    Squamous Epithelial / LPF 0-5 (*)    All other components within normal limits  CHLAMYDIA/NGC RT PCR ([facility] ONLY)  LIPASE, BLOOD  COMPREHENSIVE METABOLIC PANEL  CBC  POC URINE PREG, ED  POCT PREGNANCY, URINE   ____________________________________________  EKG   ____________________________________________  RADIOLOGY   US Pelvis Complete (Final result) Result time: 05/17/15 10:02:59   Final result by Rad Results In Interface (05/17/15 10:02:59)   Narrative:   CLINICAL DATA: Pelvic pain for 2 weeks  EXAM: TRANSABDOMINAL AND TRANSVAGINAL ULTRASOUND OF PELVIS  DOPPLER ULTRASOUND OF  OVARIES  TECHNIQUE: Study was performed transabdominally to optimize pelvic field of view evaluation and transvaginally to optimize internal visceral architecture evaluation.  Color and duplex Doppler ultrasound was utilized to evaluate blood flow to the ovaries.  COMPARISON: June 24, 2014  FINDINGS: Uterus  Measurements: 7.4 x 3.4 x 4.7 cm. No fibroids or other mass visualized. Uterus is anteverted.  Endometrium  Thickness: 7 mm. No focal abnormality visualized.  Right ovary  Measurements: 4.0 x 2.1 x 3.0 cm. Normal appearance/no adnexal mass.  Left ovary  Measurements: 5.2 x 1.7 x 2.7 cm. Normal appearance/no adnexal mass.  Pulsed Doppler evaluation of both ovaries demonstrates normal low-resistance arterial and venous waveforms.  Other findings  There is mild free fluid in the cul-de-sac region.  IMPRESSION: Mild free fluid in the cul-de-sac. Suspect recent ovarian cyst rupture. Currently there is no intrauterine or extrauterine pelvic or adnexal mass. No evidence of ovarian torsion on either side.   Electronically Signed By: Lowella Grip III M.D. On: 05/17/2015 10:02          US Transvaginal Non-OB (Final result) Result time: 05/17/15  10:06:31   Final result by Rad Results In Interface (05/17/15 10:06:31)   Narrative:   : Transabdominal, transvaginal, and ovarian Doppler ultrasound reports are combined into a single dictation.   Electronically Signed By: Lowella Grip III M.D. On: 05/17/2015 10:06          [place] Art/Ven Flow Abd Pelv Doppler (Final result) Result time: 05/17/15 10:06:06   Final result by Rad Results In Interface (05/17/15 10:06:06)   Narrative:   : Transabdominal, transvaginal, and ovarian Doppler ultrasound reports are combined into a single dictation.   Electronically Signed By: Lowella Grip III M.D. On: 05/17/2015 10:06   f ____________________________________________   PROCEDURES   ____________________________________________   INITIAL IMPRESSION / ASSESSMENT AND PLAN / ED COURSE  Pertinent labs & imaging results that were available during my care of the patient were reviewed by me and considered in my medical decision making (see chart for details).  ----------------------------------------- 11:21 AM on 05/17/2015 -----------------------------------------  Patient resting comfortable at this time. Updated her about the diagnosis including likely recently ruptured ovarian cyst and bacterial vaginosis. She understands the plan for antibiotics and discharged home. Says she has a gynecologist, Dr. Jodi Mourning, in [place] that she'll be following up with. We'll discharge with Flagyl as well as Zofran. Will take over-the-counter NSAIDs at home for pain control. ____________________________________________   FINAL CLINICAL IMPRESSION(S) / ED DIAGNOSES  Final diagnoses:  Female pelvic pain  Female pelvic pain   Bacterial vaginosis.   NEW MEDICATIONS STARTED DURING THIS VISIT:  New Prescriptions   No medications on file     Note:  This document was prepared using Dragon voice recognition software and may include unintentional dictation  errors.    Orbie Pyo, MD 05/17/15 [phone]

## 2015-05-17 NOTE — ED Notes (Addendum)
Resting in bed, husband at bedside, awaits ultrasound.

## 2015-05-17 NOTE — ED Notes (Signed)
Pt c/o nausea since April; LMP end of march; pt says she took a pregnancy test a few weeks ago that was negative; pt remains nauseous; now having intermittent pain to left lower quadrant; pain becoming stronger and more frequent; also having urinary frequency;

## 2015-06-07 ENCOUNTER — Emergency Department
Admission: EM | Admit: 2015-06-07 | Discharge: 2015-06-07 | Disposition: A | Discharge status: Home / Self Care | Payer: Medicaid Other | Attending: Emergency Medicine | Admitting: Emergency Medicine

## 2015-06-07 ENCOUNTER — Emergency Department: Payer: Medicaid Other

## 2015-06-07 ENCOUNTER — Encounter: Payer: Self-pay | Admitting: Medical Oncology

## 2015-06-07 DIAGNOSIS — Z7722 Contact with and (suspected) exposure to environmental tobacco smoke (acute) (chronic): Secondary | ICD-10-CM | POA: Insufficient documentation

## 2015-06-07 DIAGNOSIS — Z91018 Allergy to other foods: Secondary | ICD-10-CM | POA: Diagnosis not present

## 2015-06-07 DIAGNOSIS — Z791 Long term (current) use of non-steroidal anti-inflammatories (NSAID): Secondary | ICD-10-CM | POA: Insufficient documentation

## 2015-06-07 DIAGNOSIS — F329 Major depressive disorder, single episode, unspecified: Secondary | ICD-10-CM | POA: Insufficient documentation

## 2015-06-07 DIAGNOSIS — R1013 Epigastric pain: Secondary | ICD-10-CM | POA: Insufficient documentation

## 2015-06-07 DIAGNOSIS — F909 Attention-deficit hyperactivity disorder, unspecified type: Secondary | ICD-10-CM | POA: Insufficient documentation

## 2015-06-07 DIAGNOSIS — J45909 Unspecified asthma, uncomplicated: Secondary | ICD-10-CM | POA: Diagnosis not present

## 2015-06-07 DIAGNOSIS — Z79899 Other long term (current) drug therapy: Secondary | ICD-10-CM | POA: Insufficient documentation

## 2015-06-07 DIAGNOSIS — Z8669 Personal history of other diseases of the nervous system and sense organs: Secondary | ICD-10-CM | POA: Insufficient documentation

## 2015-06-07 DIAGNOSIS — Z91011 Allergy to milk products: Secondary | ICD-10-CM | POA: Diagnosis not present

## 2015-06-07 DIAGNOSIS — Z91040 Latex allergy status: Secondary | ICD-10-CM | POA: Diagnosis not present

## 2015-06-07 DIAGNOSIS — R112 Nausea with vomiting, unspecified: Secondary | ICD-10-CM | POA: Diagnosis not present

## 2015-06-07 LAB — URINALYSIS COMPLETE WITH MICROSCOPIC (ARMC ONLY)
BACTERIA UA: NONE SEEN
Bilirubin Urine: NEGATIVE
Glucose, UA: NEGATIVE mg/dL
HGB URINE DIPSTICK: NEGATIVE
Ketones, ur: NEGATIVE mg/dL
LEUKOCYTES UA: NEGATIVE
NITRITE: NEGATIVE
PH: 6 (ref 5.0–8.0)
Protein, ur: NEGATIVE mg/dL
SPECIFIC GRAVITY, URINE: 1.021 (ref 1.005–1.030)

## 2015-06-07 LAB — CBC WITH DIFFERENTIAL/PLATELET
BASOS ABS: 0.1 10*3/uL (ref 0–0.1)
BASOS PCT: 2 %
EOS ABS: 0.2 10*3/uL (ref 0–0.7)
Eosinophils Relative: 4 %
HEMATOCRIT: 40.5 % (ref 35.0–47.0)
Hemoglobin: 13.7 g/dL (ref 12.0–16.0)
Lymphocytes Relative: 32 %
Lymphs Abs: 1.9 10*3/uL (ref 1.0–3.6)
MCH: 28.6 pg (ref 26.0–34.0)
MCHC: 33.9 g/dL (ref 32.0–36.0)
MCV: 84.5 fL (ref 80.0–100.0)
MONO ABS: 0.4 10*3/uL (ref 0.2–0.9)
Monocytes Relative: 7 %
NEUTROS ABS: 3.2 10*3/uL (ref 1.4–6.5)
Neutrophils Relative %: 55 %
PLATELETS: 330 10*3/uL (ref 150–440)
RBC: 4.79 MIL/uL (ref 3.80–5.20)
RDW: 14.5 % (ref 11.5–14.5)
WBC: 5.9 10*3/uL (ref 3.6–11.0)

## 2015-06-07 LAB — COMPREHENSIVE METABOLIC PANEL
ALBUMIN: 4.7 g/dL (ref 3.5–5.0)
ALT: 21 U/L (ref 14–54)
ANION GAP: 8 (ref 5–15)
AST: 21 U/L (ref 15–41)
Alkaline Phosphatase: 92 U/L (ref 38–126)
BILIRUBIN TOTAL: 0.5 mg/dL (ref 0.3–1.2)
BUN: 13 mg/dL (ref 6–20)
CHLORIDE: 101 mmol/L (ref 101–111)
CO2: 27 mmol/L (ref 22–32)
Calcium: 9.5 mg/dL (ref 8.9–10.3)
Creatinine, Ser: 0.71 mg/dL (ref 0.44–1.00)
GFR calc Af Amer: >60 mL/min (ref >60)
GFR calc non Af Amer: >60 mL/min (ref >60)
GLUCOSE: 90 mg/dL (ref 65–99)
POTASSIUM: 3.9 mmol/L (ref 3.5–5.1)
SODIUM: 136 mmol/L (ref 135–145)
Total Protein: 8.7 g/dL — ABNORMAL HIGH (ref 6.5–8.1)

## 2015-06-07 LAB — POCT PREGNANCY, URINE: PREG TEST UR: NEGATIVE

## 2015-06-07 LAB — LIPASE, BLOOD: Lipase: 27 U/L (ref 11–51)

## 2015-06-07 MED ORDER — SODIUM CHLORIDE 0.9 % IV BOLUS (SEPSIS)
500.0000 mL | Freq: Once | INTRAVENOUS | Status: AC
  Administered 2015-06-07: 10:00:00 via INTRAVENOUS

## 2015-06-07 MED ORDER — METOCLOPRAMIDE HCL 5 MG/ML IJ SOLN
10.0000 mg | Freq: Once | INTRAMUSCULAR | Status: AC
  Administered 2015-06-07: 10 mg via INTRAVENOUS
  Filled 2015-06-07: qty 2

## 2015-06-07 MED ORDER — PANTOPRAZOLE SODIUM 20 MG PO TBEC: 20.0000 mg | DELAYED_RELEASE_TABLET | Freq: Every day | ORAL | Status: DC

## 2015-06-07 MED ORDER — METOCLOPRAMIDE HCL 10 MG PO TABS: 10.0000 mg | ORAL_TABLET | Freq: Three times a day (TID) | ORAL | Status: DC | PRN

## 2015-06-07 MED ORDER — HYDROMORPHONE HCL 1 MG/ML IJ SOLN
0.5000 mg | Freq: Once | INTRAMUSCULAR | Status: AC
  Administered 2015-06-07: 10:00:00 via INTRAVENOUS
  Filled 2015-06-07: qty 1

## 2015-06-07 MED ORDER — TRAMADOL HCL 50 MG PO TABS
50.0000 mg | ORAL_TABLET | Freq: Four times a day (QID) | ORAL | Status: DC | PRN
Start: 2015-06-07 — End: 2015-08-04

## 2015-06-07 NOTE — ED Provider Notes (Signed)
Time Seen: Approximately 0 8:30  I have reviewed the triage notes  Chief Complaint: Emesis   History of Present Illness: Latoya Stark is a 20 y.o. female *who presents with recent history of some nausea and vomiting over the last 24 hours. He states she's had some nausea over the last 3 days with decreased appetite. She states she started with vomiting and then epigastric abdominal pain. She denies any hematemesis or biliary emesis. She denies any lower abdominal pain. She denies any loose stool or diarrhea, fever, melena, hematochezia. She denies any dysuria, hematuria or urinary frequency. She's had recent surgery for an ovarian cyst and states she has not had a menstrual period since her surgery. She was advised that it may take a couple months before she starts developing regular menstrual periods. She is not aware of any fever or foodborne exposure. She does describe a headache that seems to be typical of her previous migraine headaches. She points mainly to the frontal region and states she has been taking ibuprofen at home for pain. She states the headache started after the nausea and vomiting and the abdominal pain. She denies any significant photophobia weakness. She denies any neck pain or stiffness. States this headache is characteristic of previous headaches that she's had before in the past. Past Medical History  Diagnosis Date  . Asthma   . ADHD (attention deficit hyperactivity disorder)   . Depression   . Headache   . Anemia   . UTI (lower urinary tract infection)   . Anxiety   . Seizures ([place])     "when I get too hot"  . Trichomonas infection     Patient Active Problem List   Diagnosis Date Noted  . Constipation 02/24/2014  . CN (constipation) 12/23/2013  . Gonorrhea in female 10/05/2013  . BV (bacterial vaginosis) 09/22/2013  . Candidiasis of vulva and vagina 09/22/2013    Past Surgical History  Procedure Laterality Date  . Orthopedic surgery Left     left  hand  . Adenoidectomy    . Dilation and evacuation N/A 08/09/2013    Procedure: DILATATION AND EVACUATION;  Surgeon: Frederico Hamman, MD;  Location: [facility];  Service: Gynecology;  Laterality: N/A;  . Tonsillectomy      Past Surgical History  Procedure Laterality Date  . Orthopedic surgery Left     left hand  . Adenoidectomy    . Dilation and evacuation N/A 08/09/2013    Procedure: DILATATION AND EVACUATION;  Surgeon: Frederico Hamman, MD;  Location: [facility];  Service: Gynecology;  Laterality: N/A;  . Tonsillectomy      Current Outpatient Rx  Name  Route  Sig  Dispense  Refill  . acetaminophen (TYLENOL) 500 MG tablet   Oral   Take 1,000 mg by mouth every 6 (six) hours as needed for headache.         . ibuprofen (ADVIL,MOTRIN) 800 MG tablet   Oral   Take 1 tablet (800 mg total) by mouth 3 (three) times daily.   21 tablet   0   . ondansetron (ZOFRAN) 4 MG tablet   Oral   Take 1 tablet (4 mg total) by mouth daily as needed.   10 tablet   0   . Prenat-FeCbn-FeAspGl-FA-Omega (OB COMPLETE PETITE) 35-5-1-200 MG CAPS   Oral   Take 1 capsule by mouth daily before breakfast.   30 capsule   11   . promethazine (PHENERGAN) 25 MG tablet   Oral  Take 1 tablet (25 mg total) by mouth every 6 (six) hours as needed for nausea or vomiting.   10 tablet   0     Dispense as written.   . promethazine (PHENERGAN) 6.25 MG/5ML syrup   Oral   Take 20 mLs (25 mg total) by mouth every 6 (six) hours as needed for nausea or vomiting.   473 mL   2   . traMADol (ULTRAM) 50 MG tablet   Oral   Take 1 tablet (50 mg total) by mouth every 6 (six) hours as needed for severe pain.   15 tablet   0     Allergies:  Milk-related compounds; Mushroom extract complex; Penicillins; Cheese; Latex; and Sulfa antibiotics  Family History: Family History  Problem Relation Age of Onset  . Seizures Mother   . Diabetes Maternal Grandmother   . Colon cancer Maternal Grandmother   . Hypertension  Maternal Grandmother   . Clotting disorder Maternal Grandmother     Social History: Social History  Substance Use Topics  . Smoking status: Passive Smoke Exposure - Never Smoker  . Smokeless tobacco: Never Used  . Alcohol Use: No     Review of Systems:   10 point review of systems was performed and was otherwise negative:  Constitutional: No fever Eyes: No visual disturbances ENT: No sore throat, ear pain Cardiac: No chest pain Respiratory: No shortness of breath, wheezing, or stridor Abdomen: Patient points to the epigastric area and states her pain is relatively persistent with waves of nausea and the pain will occasionally move into both upper abdominal regions. Endocrine: No weight loss, No night sweats Extremities: No peripheral edema, cyanosis Skin: No rashes, easy bruising Neurologic: No focal weakness, trouble with speech or swollowing Urologic: No dysuria, Hematuria, or urinary frequency   Physical Exam:  ED Triage Vitals  Enc Vitals Group     BP 06/07/15 0821 130/79 mmHg     Pulse Rate 06/07/15 0821 85     Resp 06/07/15 0821 17     Temp 06/07/15 0821 97.9 F (36.6 C)     Temp Source 06/07/15 0821 Oral     SpO2 06/07/15 0821 100 %     Weight 06/07/15 0821 220 lb (99.791 kg)     Height --      Head Cir --      Peak Flow --      Pain Score 06/07/15 0821 9     Pain Loc --      Pain Edu? --      Excl. in [place]? --     General: Awake , Alert , and Oriented times 3; GCS 15. No current active vomiting Head: Normal cephalic , atraumatic Eyes: Pupils equal , round, reactive to light Nose/Throat: No nasal drainage, patent upper airway without erythema or exudate.  Neck: Supple, Full range of motion, No anterior adenopathy or palpable thyroid masses Lungs: Clear to ascultation without wheezes , rhonchi, or rales Heart: Regular rate, regular rhythm without murmurs , gallops , or rubs Abdomen: Mild tenderness to deep palpation in the epigastric region without  rebound, guarding, or rigidity. Negative Murphy's sign. No focal tenderness below the umbilicus and no tenderness over McBurney's point. Bowel sounds are positive and symmetric in all 4 quadrants..        Extremities: 2 plus symmetric pulses. No edema, clubbing or cyanosis Neurologic: normal ambulation, Motor symmetric without deficits, sensory intact Skin: warm, dry, no rashes   Labs:   All laboratory work  was reviewed including any pertinent negatives or positives listed below:  Labs Reviewed  [place] ([place]) - Abnormal; Notable for the following:    Color, Urine YELLOW (*)    APPearance CLEAR (*)    Squamous Epithelial / LPF 0-5 (*)    All other components within normal limits  CBC WITH DIFFERENTIAL/PLATELET  COMPREHENSIVE METABOLIC PANEL  LIPASE, BLOOD  POCT PREGNANCY, URINE  POC URINE PREG, ED     Radiology:    CLINICAL DATA: Epigastric abdominal pain.  EXAM: US ABDOMEN LIMITED - RIGHT UPPER QUADRANT  COMPARISON: None.  FINDINGS: Gallbladder:  Multiple gallstones on the order of 1 cm or less. No wall thickening or pericholecystic fluid. Sonographic Murphy's sign was not elicited.  Common bile duct:  Diameter: Normal, 2 mm.  Liver:  No focal lesion identified. Within normal limits in parenchymal echogenicity.  IMPRESSION: Cholelithiasis without acute cholecystitis or biliary duct dilatation.    I personally reviewed the radiologic studies     ED Course: * Patient's stay here was uneventful and she was symptomatically improved on repeat exam with IV fluids, IV Reglan, Dilaudid 0.5 mg IV. Patient's epigastric pain I felt was from some gastritis and did not appear to be gastroenteritis at this time. She was advised to stop taking the ibuprofen, though a good pain medication, it is most likely irritating lining of her stomach. She will be placed on Protonix and was discharged with Reglan and Ultram for pain and nausea per  respectively. She states her migraine headache is symptomatically improved along with her abdominal pain and I felt this was unlikely to be a surgical abdomen or life-threatening cause for headache such as aneurysm, meningitis, encephalitis, etc.   Assessment:  Acute epigastric abdominal pain Nausea vomiting Case exacerbation of chronic migraine cephalgia   Final Clinical Impression:  Final diagnoses:  Epigastric pain     Plan:  Outpatient management Patient was advised to return immediately if condition worsens. Patient was advised to follow up with their primary care physician or other specialized physicians involved in their outpatient care. The patient and/or family member/power of attorney had laboratory results reviewed at the bedside. All questions and concerns were addressed and appropriate discharge instructions were distributed by the nursing staff.          Daymon Larsen, MD 06/07/15 1100

## 2015-06-07 NOTE — Discharge Instructions (Signed)
Abdominal Pain, Adult Many things can cause belly (abdominal) pain. Most times, the belly pain is not dangerous. Many cases of belly pain can be watched and treated at home. HOME CARE   Do not take medicines that help you go poop (laxatives) unless told to by your doctor.  Only take medicine as told by your doctor.  Eat or drink as told by your doctor. Your doctor will tell you if you should be on a special diet. GET HELP IF:  You do not know what is causing your belly pain.  You have belly pain while you are sick to your stomach (nauseous) or have runny poop (diarrhea).  You have pain while you pee or poop.  Your belly pain wakes you up at night.  You have belly pain that gets worse or better when you eat.  You have belly pain that gets worse when you eat fatty foods.  You have a fever. GET HELP RIGHT AWAY IF:   The pain does not go away within 2 hours.  You keep throwing up (vomiting).  The pain changes and is only in the right or left part of the belly.  You have bloody or tarry looking poop. MAKE SURE YOU:   Understand these instructions.  Will watch your condition.  Will get help right away if you are not doing well or get worse.   This information is not intended to replace advice given to you by your health care provider. Make sure you discuss any questions you have with your health care provider.   Document Released: 06/14/2007 Document Revised: 01/16/2014 Document Reviewed: 09/04/2012 Elsevier Interactive Patient Education [employer].  Please return immediately if condition worsens. Please contact her primary physician or the physician you were given for referral. If you have any specialist physicians involved in her treatment and plan please also contact them. Thank you for using [facility].

## 2015-06-07 NOTE — ED Notes (Signed)
Pt reports vomiting for a few days. Denies diarrhea. Reports discomfort to abdomen.

## 2015-06-18 ENCOUNTER — Encounter: Payer: Self-pay | Admitting: *Deleted

## 2015-06-18 ENCOUNTER — Emergency Department
Admission: EM | Admit: 2015-06-18 | Discharge: 2015-06-18 | Disposition: A | Discharge status: Home / Self Care | Payer: Medicaid Other | Attending: Emergency Medicine | Admitting: Emergency Medicine

## 2015-06-18 ENCOUNTER — Emergency Department: Payer: Medicaid Other

## 2015-06-18 DIAGNOSIS — F410 Panic disorder [episodic paroxysmal anxiety] without agoraphobia: Secondary | ICD-10-CM | POA: Insufficient documentation

## 2015-06-18 DIAGNOSIS — F445 Conversion disorder with seizures or convulsions: Secondary | ICD-10-CM | POA: Insufficient documentation

## 2015-06-18 DIAGNOSIS — R51 Headache: Secondary | ICD-10-CM | POA: Diagnosis not present

## 2015-06-18 DIAGNOSIS — J45909 Unspecified asthma, uncomplicated: Secondary | ICD-10-CM | POA: Diagnosis not present

## 2015-06-18 DIAGNOSIS — F909 Attention-deficit hyperactivity disorder, unspecified type: Secondary | ICD-10-CM | POA: Diagnosis not present

## 2015-06-18 DIAGNOSIS — Z79899 Other long term (current) drug therapy: Secondary | ICD-10-CM | POA: Insufficient documentation

## 2015-06-18 DIAGNOSIS — Z791 Long term (current) use of non-steroidal anti-inflammatories (NSAID): Secondary | ICD-10-CM | POA: Diagnosis not present

## 2015-06-18 DIAGNOSIS — F329 Major depressive disorder, single episode, unspecified: Secondary | ICD-10-CM | POA: Insufficient documentation

## 2015-06-18 DIAGNOSIS — Z7722 Contact with and (suspected) exposure to environmental tobacco smoke (acute) (chronic): Secondary | ICD-10-CM | POA: Insufficient documentation

## 2015-06-18 DIAGNOSIS — R569 Unspecified convulsions: Secondary | ICD-10-CM

## 2015-06-18 LAB — URINALYSIS COMPLETE WITH MICROSCOPIC (ARMC ONLY)
Bilirubin Urine: NEGATIVE
GLUCOSE, UA: NEGATIVE mg/dL
KETONES UR: NEGATIVE mg/dL
Leukocytes, UA: NEGATIVE
NITRITE: NEGATIVE
PROTEIN: NEGATIVE mg/dL
SPECIFIC GRAVITY, URINE: 1.023 (ref 1.005–1.030)
pH: 6 (ref 5.0–8.0)

## 2015-06-18 LAB — COMPREHENSIVE METABOLIC PANEL
ALK PHOS: 90 U/L (ref 38–126)
ALT: 22 U/L (ref 14–54)
AST: 22 U/L (ref 15–41)
Albumin: 4.2 g/dL (ref 3.5–5.0)
Anion gap: 6 (ref 5–15)
BILIRUBIN TOTAL: 0.4 mg/dL (ref 0.3–1.2)
BUN: 8 mg/dL (ref 6–20)
CO2: 29 mmol/L (ref 22–32)
CREATININE: 0.87 mg/dL (ref 0.44–1.00)
Calcium: 9.2 mg/dL (ref 8.9–10.3)
Chloride: 104 mmol/L (ref 101–111)
Glucose, Bld: 94 mg/dL (ref 65–99)
Potassium: 3.6 mmol/L (ref 3.5–5.1)
Sodium: 139 mmol/L (ref 135–145)
TOTAL PROTEIN: 7.5 g/dL (ref 6.5–8.1)

## 2015-06-18 LAB — URINE DRUG SCREEN, QUALITATIVE (ARMC ONLY)
AMPHETAMINES, UR SCREEN: NOT DETECTED
Barbiturates, Ur Screen: NOT DETECTED
Benzodiazepine, Ur Scrn: NOT DETECTED
CANNABINOID 50 NG, UR ~~LOC~~: NOT DETECTED
COCAINE METABOLITE, UR ~~LOC~~: NOT DETECTED
MDMA (ECSTASY) UR SCREEN: NOT DETECTED
Methadone Scn, Ur: NOT DETECTED
Opiate, Ur Screen: NOT DETECTED
PHENCYCLIDINE (PCP) UR S: NOT DETECTED
TRICYCLIC, UR SCREEN: NOT DETECTED

## 2015-06-18 LAB — CBC WITH DIFFERENTIAL/PLATELET
BASOS ABS: 0 10*3/uL (ref 0–0.1)
Basophils Relative: 0 %
EOS PCT: 3 %
Eosinophils Absolute: 0.2 10*3/uL (ref 0–0.7)
HEMATOCRIT: 40.5 % (ref 35.0–47.0)
HEMOGLOBIN: 13.5 g/dL (ref 12.0–16.0)
LYMPHS ABS: 1.5 10*3/uL (ref 1.0–3.6)
LYMPHS PCT: 28 %
MCH: 28.3 pg (ref 26.0–34.0)
MCHC: 33.4 g/dL (ref 32.0–36.0)
MCV: 84.9 fL (ref 80.0–100.0)
Monocytes Absolute: 0.5 10*3/uL (ref 0.2–0.9)
Monocytes Relative: 9 %
NEUTROS ABS: 3.2 10*3/uL (ref 1.4–6.5)
Neutrophils Relative %: 60 %
PLATELETS: 339 10*3/uL (ref 150–440)
RBC: 4.77 MIL/uL (ref 3.80–5.20)
RDW: 14.3 % (ref 11.5–14.5)
WBC: 5.3 10*3/uL (ref 3.6–11.0)

## 2015-06-18 MED ORDER — ACETAMINOPHEN 500 MG PO TABS
1000.0000 mg | ORAL_TABLET | Freq: Once | ORAL | Status: AC
  Administered 2015-06-18: 1000 mg via ORAL
  Filled 2015-06-18: qty 2

## 2015-06-18 MED ORDER — SODIUM CHLORIDE 0.9 % IV SOLN
1000.0000 mL | Freq: Once | INTRAVENOUS | Status: AC
Start: 2015-06-18 — End: 2015-06-18
  Administered 2015-06-18: 1000 mL via INTRAVENOUS

## 2015-06-18 NOTE — ED Notes (Signed)
BEDSIDE POCT NEGATIVE

## 2015-06-18 NOTE — ED Notes (Signed)
Pt arrives via EMS from home with seizures, EMs reports bystanders stated they did CPR, pt arrives awake but slow to answer questions, shaking, and states "Its hard to breathe", hyperventlating, states hx of seizures when she was a child but not on any medication, reports she is currently on medication for gallstones but unable to say what medication

## 2015-06-18 NOTE — ED Provider Notes (Signed)
[facility] Emergency Department Provider Note        Time seen: ----------------------------------------- 1:57 PM on 06/18/2015 -----------------------------------------    I have reviewed the triage vital signs and the nursing notes.   HISTORY  Chief Complaint Seizures    HPI Latoya Stark is a 20 y.o. female who presents to ER for headache and chest pain. Patient arrives via EMS with reported seizures. She had possibly receive CPR prior to arrival. She arrives awake but slow to answer shaking and hyperventilating. Patient continues to state "I want my mama". Patient will not communicate with me until her mother arrives.   Past Medical History  Diagnosis Date  . Asthma   . ADHD (attention deficit hyperactivity disorder)   . Depression   . Headache   . Anemia   . UTI (lower urinary tract infection)   . Anxiety   . Seizures ([place])     "when I get too hot"  . Trichomonas infection     Patient Active Problem List   Diagnosis Date Noted  . Constipation 02/24/2014  . CN (constipation) 12/23/2013  . Gonorrhea in female 10/05/2013  . BV (bacterial vaginosis) 09/22/2013  . Candidiasis of vulva and vagina 09/22/2013    Past Surgical History  Procedure Laterality Date  . Orthopedic surgery Left     left hand  . Adenoidectomy    . Dilation and evacuation N/A 08/09/2013    Procedure: DILATATION AND EVACUATION;  Surgeon: Frederico Hamman, MD;  Location: [facility];  Service: Gynecology;  Laterality: N/A;  . Tonsillectomy      Allergies Milk-related compounds; Mushroom extract complex; Penicillins; Cheese; Latex; and Sulfa antibiotics  Social History Social History  Substance Use Topics  . Smoking status: Passive Smoke Exposure - Never Smoker  . Smokeless tobacco: Never Used  . Alcohol Use: No    Review of Systems Constitutional: Negative for fever. Cardiovascular: Positive for chest pain Respiratory: Negative for shortness of  breath. Gastrointestinal: Negative for abdominal pain, vomiting and diarrhea. Genitourinary: Negative for dysuria. Musculoskeletal: Negative for back pain. Skin: Negative for rash. Neurological: Positive for headache  10-point ROS otherwise negative.  ____________________________________________   PHYSICAL EXAM:  VITAL SIGNS: ED Triage Vitals  Enc Vitals Group     BP 06/18/15 1344 113/97 mmHg     Pulse Rate 06/18/15 1344 87     Resp 06/18/15 1344 26     Temp --      Temp Source 06/18/15 1344 Oral     SpO2 06/18/15 1344 98 %     Weight 06/18/15 1344 220 lb (99.791 kg)     Height 06/18/15 1344 5\' 8"  (1.727 m)     Head Cir --      Peak Flow --      Pain Score 06/18/15 1345 6     Pain Loc --      Pain Edu? --      Excl. in [place]? --    Constitutional: Anxious, tearful, no distress Eyes: Conjunctivae are normal. PERRL. Normal extraocular movements. ENT   Head: Normocephalic and atraumatic.   Nose: No congestion/rhinnorhea.   Mouth/Throat: Mucous membranes are moist.   Neck: No stridor. Cardiovascular: Rapid rate, regular rhythm. No murmurs, rubs, or gallops. Respiratory: Tachypnea with clear lungs. Gastrointestinal: Soft and nontender. Normal bowel sounds Musculoskeletal: Nontender with normal range of motion in all extremities. No lower extremity tenderness nor edema. Neurologic:  Normal speech and language. No gross focal neurologic deficits are appreciated.  Skin:  Skin is warm, dry and intact. No rash noted. Psychiatric: Depressed mood and affect   ____________________________________________  ED COURSE:  Pertinent labs & imaging results that were available during my care of the patient were reviewed by me and considered in my medical decision making (see chart for details). Patient is refusing care until her mother arrives. She refuses to answer any questions.  After the boyfriend and mother arrived, patient was more cooperative. We will assess for  possible seizure. ____________________________________________    LABS (pertinent positives/negatives)  Labs Reviewed  URINALYSIS COMPLETEWITH MICROSCOPIC ([facility] ONLY) - Abnormal; Notable for the following:    Color, Urine YELLOW (*)    APPearance CLEAR (*)    Hgb urine dipstick 3+ (*)    Bacteria, UA RARE (*)    Squamous Epithelial / LPF 0-5 (*)    All other components within normal limits  CBC WITH DIFFERENTIAL/PLATELET  COMPREHENSIVE METABOLIC PANEL  URINE DRUG SCREEN, QUALITATIVE ([facility] ONLY)  POC URINE PREG, ED    RADIOLOGY CT head  IMPRESSION: No acute intracranial abnormality.  ____________________________________________  FINAL ASSESSMENT AND PLAN  Pseudoseizure, anxiety attack  Plan: Patient with labs and imaging as dictated above. Patient is in no acute distress, labs and imaging were unremarkable. I have advised family this is likely pseudoseizure from stress. Family does reports she's been under a lot of stress recently. She is currently calm and cooperative, labs and test are unremarkable. She stable for discharge.   Earleen Newport, MD   Note: This dictation was prepared with Dragon dictation. Any transcriptional errors that result from this process are unintentional   Earleen Newport, MD 06/18/15 1620

## 2015-06-18 NOTE — Discharge Instructions (Signed)
Nonepileptic Seizures Nonepileptic seizures are seizures that are not caused by abnormal electrical signals in your brain. These seizures often seem like epileptic seizures, but they are not caused by epilepsy.  There are two types of nonepileptic seizures:  A physiologic nonepileptic seizure results from a disruption in your brain.  A psychogenic seizure results from emotional stress. These seizures are sometimes called pseudoseizures. CAUSES  Causes of physiologic nonepileptic seizures include:   Sudden drop in blood pressure.  Low blood sugar.  Low levels of salt (sodium) in your blood.  Low levels of calcium in your blood.  Migraine.  Heart rhythm problems.  Sleep disorders.  Drug and alcohol abuse. Common causes of psychogenic nonepileptic seizures include:  Stress.  Emotional trauma.  Sexual or physical abuse.  Major life events, such as divorce or the death of a loved one.  Mental health disorders, including panic attack and hyperactivity disorder. SIGNS AND SYMPTOMS A nonepileptic seizure can look like an epileptic seizure, including uncontrollable shaking (convulsions), or changes in attention, behavior, or the ability to remain awake and alert. However, there are some differences. Nonepileptic seizures usually:  Do not cause physical injuries.  Start slowly.  Include crying or shrieking.  Last longer than 2 minutes.  Have a short recovery time without headache or exhaustion. DIAGNOSIS  Your health care provider can usually diagnose nonepileptic seizures after taking your medical history and giving you a physical exam. Your health care provider may want to talk to your friends or relatives who have seen you have a seizure.  You may also need to have tests to look for causes of physiologic nonepileptic seizures. This may include an electroencephalogram (EEG), which is a test that measures electrical activity in your brain. If you have had an epileptic  seizure, the results of your EEG will be abnormal. If your health care provider thinks you have had a psychogenic nonepileptic seizure, you may need to see a mental health specialist for an evaluation. TREATMENT  Treatment depends on the type and cause of your seizures.  For physiologic nonepileptic seizures, treatment is aimed at addressing the underlying condition that caused the seizures. These seizures usually stop when the underlying condition is properly treated.  Nonepileptic seizures do not respond to the seizure medicines used to treat epilepsy.  For psychogenic seizures, you may need to work with a mental health specialist. [facility] care will depend on the type of nonepileptic seizures you have.   Follow all your health care provider's instructions.  Keep all your follow-up appointments. SEEK MEDICAL CARE IF: You continue to have seizures after treatment. SEEK IMMEDIATE MEDICAL CARE IF:  Your seizures change or become more frequent.  You injure yourself during a seizure.  You have one seizure after another.  You have trouble recovering from a seizure.  You have chest pain or trouble breathing. MAKE SURE YOU:  Understand these instructions.  Will watch your condition.  Will get help right away if you are not doing well or get worse.   This information is not intended to replace advice given to you by your health care provider. Make sure you discuss any questions you have with your health care provider.   Document Released: 02/10/2005 Document Revised: 01/16/2014 Document Reviewed: 10/22/2012 Elsevier Interactive Patient Education 2016 Elsevier Inc.  Panic Attacks Panic attacks are sudden, short-livedsurges of severe anxiety, fear, or discomfort. They may occur for no reason when you are relaxed, when you are anxious, or when you are sleeping.  Panic attacks may occur for a number of reasons:   Healthy people occasionally have panic attacks in  extreme, life-threatening situations, such as war or natural disasters. Normal anxiety is a protective mechanism of the body that helps [place] react to danger (fight or flight response).  Panic attacks are often seen with anxiety disorders, such as panic disorder, social anxiety disorder, generalized anxiety disorder, and phobias. Anxiety disorders cause excessive or uncontrollable anxiety. They may interfere with your relationships or other life activities.  Panic attacks are sometimes seen with other mental illnesses, such as depression and posttraumatic stress disorder.  Certain medical conditions, prescription medicines, and drugs of abuse can cause panic attacks. SYMPTOMS  Panic attacks start suddenly, peak within 20 minutes, and are accompanied by four or more of the following symptoms:  Pounding heart or fast heart rate (palpitations).  Sweating.  Trembling or shaking.  Shortness of breath or feeling smothered.  Feeling choked.  Chest pain or discomfort.  Nausea or strange feeling in your stomach.  Dizziness, light-headedness, or feeling like you will faint.  Chills or hot flushes.  Numbness or tingling in your lips or hands and feet.  Feeling that things are not real or feeling that you are not yourself.  Fear of losing control or going crazy.  Fear of dying. Some of these symptoms can mimic serious medical conditions. For example, you may think you are having a heart attack. Although panic attacks can be very scary, they are not life threatening. DIAGNOSIS  Panic attacks are diagnosed through an assessment by your health care provider. Your health care provider will ask questions about your symptoms, such as where and when they occurred. Your health care provider will also ask about your medical history and use of alcohol and drugs, including prescription medicines. Your health care provider may order blood tests or other studies to rule out a serious medical condition. Your  health care provider may refer you to a mental health professional for further evaluation. TREATMENT   Most healthy people who have one or two panic attacks in an extreme, life-threatening situation will not require treatment.  The treatment for panic attacks associated with anxiety disorders or other mental illness typically involves counseling with a mental health professional, medicine, or a combination of both. Your health care provider will help determine what treatment is best for you.  Panic attacks due to physical illness usually go away with treatment of the illness. If prescription medicine is causing panic attacks, talk with your health care provider about stopping the medicine, decreasing the dose, or substituting another medicine.  Panic attacks due to alcohol or drug abuse go away with abstinence. Some adults need professional help in order to stop drinking or using drugs. HOME CARE INSTRUCTIONS   Take all medicines as directed by your health care provider.   Schedule and attend follow-up visits as directed by your health care provider. It is important to keep all your appointments. SEEK MEDICAL CARE IF:  You are not able to take your medicines as prescribed.  Your symptoms do not improve or get worse. SEEK IMMEDIATE MEDICAL CARE IF:   You experience panic attack symptoms that are different than your usual symptoms.  You have serious thoughts about hurting yourself or others.  You are taking medicine for panic attacks and have a serious side effect. MAKE SURE YOU:  Understand these instructions.  Will watch your condition.  Will get help right away if you are not doing well or get  worse.   This information is not intended to replace advice given to you by your health care provider. Make sure you discuss any questions you have with your health care provider.   Document Released: 12/26/2004 Document Revised: 12/31/2012 Document Reviewed: 08/09/2012 Elsevier  Interactive Patient Education [employer].

## 2015-06-18 NOTE — ED Notes (Signed)
EDP at bedside  

## 2015-06-18 NOTE — ED Notes (Signed)
Pt states left sided headache, pt continues to state "I want my momma", states chest heaviness

## 2015-07-23 ENCOUNTER — Emergency Department
Admission: EM | Admit: 2015-07-23 | Discharge: 2015-07-23 | Disposition: A | Discharge status: Home / Self Care | Payer: Medicaid Other | Attending: Emergency Medicine | Admitting: Emergency Medicine

## 2015-07-23 ENCOUNTER — Encounter: Payer: Self-pay | Admitting: Emergency Medicine

## 2015-07-23 DIAGNOSIS — Z8669 Personal history of other diseases of the nervous system and sense organs: Secondary | ICD-10-CM | POA: Diagnosis not present

## 2015-07-23 DIAGNOSIS — F909 Attention-deficit hyperactivity disorder, unspecified type: Secondary | ICD-10-CM | POA: Diagnosis not present

## 2015-07-23 DIAGNOSIS — Z79899 Other long term (current) drug therapy: Secondary | ICD-10-CM | POA: Insufficient documentation

## 2015-07-23 DIAGNOSIS — K529 Noninfective gastroenteritis and colitis, unspecified: Secondary | ICD-10-CM

## 2015-07-23 DIAGNOSIS — J45909 Unspecified asthma, uncomplicated: Secondary | ICD-10-CM | POA: Insufficient documentation

## 2015-07-23 DIAGNOSIS — R197 Diarrhea, unspecified: Secondary | ICD-10-CM | POA: Diagnosis present

## 2015-07-23 DIAGNOSIS — F329 Major depressive disorder, single episode, unspecified: Secondary | ICD-10-CM | POA: Diagnosis not present

## 2015-07-23 DIAGNOSIS — Z7722 Contact with and (suspected) exposure to environmental tobacco smoke (acute) (chronic): Secondary | ICD-10-CM | POA: Insufficient documentation

## 2015-07-23 LAB — CBC
HCT: 39.4 % (ref 35.0–47.0)
HEMOGLOBIN: 13.4 g/dL (ref 12.0–16.0)
MCH: 29.2 pg (ref 26.0–34.0)
MCHC: 33.9 g/dL (ref 32.0–36.0)
MCV: 86.1 fL (ref 80.0–100.0)
Platelets: 326 10*3/uL (ref 150–440)
RBC: 4.58 MIL/uL (ref 3.80–5.20)
RDW: 14.1 % (ref 11.5–14.5)
WBC: 6.3 10*3/uL (ref 3.6–11.0)

## 2015-07-23 LAB — URINALYSIS COMPLETE WITH MICROSCOPIC (ARMC ONLY)
BILIRUBIN URINE: NEGATIVE
GLUCOSE, UA: NEGATIVE mg/dL
HGB URINE DIPSTICK: NEGATIVE
Ketones, ur: NEGATIVE mg/dL
NITRITE: NEGATIVE
PH: 8 (ref 5.0–8.0)
Protein, ur: NEGATIVE mg/dL
Specific Gravity, Urine: 1.018 (ref 1.005–1.030)

## 2015-07-23 LAB — COMPREHENSIVE METABOLIC PANEL
ALBUMIN: 4.2 g/dL (ref 3.5–5.0)
ALT: 28 U/L (ref 14–54)
AST: 26 U/L (ref 15–41)
Alkaline Phosphatase: 84 U/L (ref 38–126)
Anion gap: 7 (ref 5–15)
BUN: 10 mg/dL (ref 6–20)
CHLORIDE: 102 mmol/L (ref 101–111)
CO2: 28 mmol/L (ref 22–32)
CREATININE: 0.72 mg/dL (ref 0.44–1.00)
Calcium: 8.9 mg/dL (ref 8.9–10.3)
GFR calc non Af Amer: >60 mL/min (ref >60)
Glucose, Bld: 91 mg/dL (ref 65–99)
Potassium: 3.7 mmol/L (ref 3.5–5.1)
SODIUM: 137 mmol/L (ref 135–145)
Total Bilirubin: 0.1 mg/dL — ABNORMAL LOW (ref 0.3–1.2)
Total Protein: 7.4 g/dL (ref 6.5–8.1)

## 2015-07-23 LAB — POCT PREGNANCY, URINE: PREG TEST UR: NEGATIVE

## 2015-07-23 LAB — LIPASE, BLOOD: LIPASE: 23 U/L (ref 11–51)

## 2015-07-23 MED ORDER — ONDANSETRON 4 MG PO TBDP
4.0000 mg | ORAL_TABLET | Freq: Once | ORAL | Status: AC
  Administered 2015-07-23: 4 mg via ORAL
  Filled 2015-07-23: qty 1

## 2015-07-23 MED ORDER — ONDANSETRON 4 MG PO TBDP
ORAL_TABLET | ORAL | Status: AC
  Filled 2015-07-23: qty 1

## 2015-07-23 MED ORDER — ONDANSETRON HCL 4 MG PO TABS: 4.0000 mg | ORAL_TABLET | Freq: Every day | ORAL | Status: DC | PRN

## 2015-07-23 NOTE — ED Notes (Signed)
Pt presents with diarrhea vomiting for a little over a week now. No vomiting noted in triage.

## 2015-07-23 NOTE — ED Provider Notes (Signed)
[facility] Emergency Department Provider Note  ____________________________________________   I have reviewed the triage vital signs and the nursing notes.   HISTORY  Chief Complaint Emesis and Diarrhea  Patient states that  HPI Latoya Stark is a 20 y.o. female presents with nausea vomiting diarrhea for the last few days. She is not sure exactly how long his been going on. She denies any vaginal discharge. She states she's been having loose stools. Had 2 loose stools today and vomited twice. States that she has no abdominal pain at this time.No melena no bright red blood per rectum, no fever, no recent travel, no recent antibiotics. No known sick contacts with similar.     Past Medical History  Diagnosis Date  . Asthma   . ADHD (attention deficit hyperactivity disorder)   . Depression   . Headache   . Anemia   . UTI (lower urinary tract infection)   . Anxiety   . Seizures ([place])     "when I get too hot"  . Trichomonas infection     Patient Active Problem List   Diagnosis Date Noted  . Constipation 02/24/2014  . CN (constipation) 12/23/2013  . Gonorrhea in female 10/05/2013  . BV (bacterial vaginosis) 09/22/2013  . Candidiasis of vulva and vagina 09/22/2013    Past Surgical History  Procedure Laterality Date  . Orthopedic surgery Left     left hand  . Adenoidectomy    . Dilation and evacuation N/A 08/09/2013    Procedure: DILATATION AND EVACUATION;  Surgeon: Frederico Hamman, MD;  Location: [facility];  Service: Gynecology;  Laterality: N/A;  . Tonsillectomy      Current Outpatient Rx  Name  Route  Sig  Dispense  Refill  . acetaminophen (TYLENOL) 500 MG tablet   Oral   Take 1,000 mg by mouth every 6 (six) hours as needed for headache.         . ibuprofen (ADVIL,MOTRIN) 800 MG tablet   Oral   Take 1 tablet (800 mg total) by mouth 3 (three) times daily.   21 tablet   0   . metoCLOPramide (REGLAN) 10 MG tablet    Oral   Take 1 tablet (10 mg total) by mouth every 8 (eight) hours as needed for nausea.   30 tablet   1   . ondansetron (ZOFRAN) 4 MG tablet   Oral   Take 1 tablet (4 mg total) by mouth daily as needed.   10 tablet   0   . pantoprazole (PROTONIX) 20 MG tablet   Oral   Take 1 tablet (20 mg total) by mouth daily.   30 tablet   1   . Prenat-FeCbn-FeAspGl-FA-Omega (OB COMPLETE PETITE) 35-5-1-200 MG CAPS   Oral   Take 1 capsule by mouth daily before breakfast.   30 capsule   11   . promethazine (PHENERGAN) 25 MG tablet   Oral   Take 1 tablet (25 mg total) by mouth every 6 (six) hours as needed for nausea or vomiting.   10 tablet   0     Dispense as written.   . promethazine (PHENERGAN) 6.25 MG/5ML syrup   Oral   Take 20 mLs (25 mg total) by mouth every 6 (six) hours as needed for nausea or vomiting.   473 mL   2   . traMADol (ULTRAM) 50 MG tablet   Oral   Take 1 tablet (50 mg total) by mouth every 6 (six) hours  as needed.   20 tablet   0     Allergies Milk-related compounds; Mushroom extract complex; Penicillins; Cheese; Latex; and Sulfa antibiotics  Family History  Problem Relation Age of Onset  . Seizures Mother   . Diabetes Maternal Grandmother   . Colon cancer Maternal Grandmother   . Hypertension Maternal Grandmother   . Clotting disorder Maternal Grandmother     Social History Social History  Substance Use Topics  . Smoking status: Passive Smoke Exposure - Never Smoker  . Smokeless tobacco: Never Used  . Alcohol Use: No    Review of Systems Constitutional: No fever/chills Eyes: No visual changes. ENT: No sore throat. No stiff neck no neck pain Cardiovascular: Denies chest pain. Respiratory: Denies shortness of breath. Gastrointestinal:   no vomiting.  No diarrhea.  No constipation. Genitourinary: Negative for dysuria. Musculoskeletal: Negative lower extremity swelling Skin: Negative for rash. Neurological: Negative for headaches, focal  weakness or numbness. 10-point ROS otherwise negative.  ____________________________________________   PHYSICAL EXAM:  VITAL SIGNS: ED Triage Vitals  Enc Vitals Group     BP 07/23/15 1445 127/79 mmHg     Pulse Rate 07/23/15 1442 91     Resp 07/23/15 1442 20     Temp 07/23/15 1442 98.9 F (37.2 C)     Temp Source 07/23/15 1442 Oral     SpO2 07/23/15 1442 100 %     Weight --      Height --      Head Cir --      Peak Flow --      Pain Score 07/23/15 1444 8     Pain Loc --      Pain Edu? --      Excl. in [place]? --     Constitutional: Alert and oriented. Well appearing and in no acute distress. Eyes: Conjunctivae are normal. PERRL. EOMI. Head: Atraumatic. Nose: No congestion/rhinnorhea. Mouth/Throat: Mucous membranes are moist.  Oropharynx non-erythematous. Neck: No stridor.   Nontender with no meningismus Cardiovascular: Normal rate, regular rhythm. Grossly normal heart sounds.  Good peripheral circulation. Respiratory: Normal respiratory effort.  No retractions. Lungs CTAB. Abdominal: Soft and nontender. No distention. No guarding no rebound Back:  There is no focal tenderness or step off there is no midline tenderness there are no lesions noted. there is no CVA tenderness Musculoskeletal: No lower extremity tenderness. No joint effusions, no DVT signs strong distal pulses no edema Neurologic:  Normal speech and language. No gross focal neurologic deficits are appreciated.  Skin:  Skin is warm, dry and intact. No rash noted. Psychiatric: Mood and affect are normal. Speech and behavior are normal.  ____________________________________________   LABS (all labs ordered are listed, but only abnormal results are displayed)  Labs Reviewed  COMPREHENSIVE METABOLIC PANEL - Abnormal; Notable for the following:    Total Bilirubin 0.1 (*)    All other components within normal limits  URINALYSIS COMPLETEWITH MICROSCOPIC ([facility] ONLY) - Abnormal; Notable for the following:    Color,  Urine YELLOW (*)    APPearance CLEAR (*)    Leukocytes, UA TRACE (*)    Bacteria, UA RARE (*)    Squamous Epithelial / LPF 0-5 (*)    All other components within normal limits  LIPASE, BLOOD  CBC  POCT PREGNANCY, URINE  POC URINE PREG, ED   ____________________________________________  EKG  I personally interpreted any EKGs ordered by me or triage  ____________________________________________  RADIOLOGY  I reviewed any imaging ordered by me or triage that  were performed during my shift and, if possible, patient and/or family made aware of any abnormal findings. ____________________________________________   PROCEDURES  Procedure(s) performed: None  Critical Care performed: None  ____________________________________________   INITIAL IMPRESSION / ASSESSMENT AND PLAN / ED COURSE  Pertinent labs & imaging results that were available during my care of the patient were reviewed by me and considered in my medical decision making (see chart for details).  Patient with nausea vomiting and diarrhea, no acute distress with a normal abdominal exam with no evidence surgical pathology in her abdomen. Given history of diarrhea and vomiting, low suspicion for PID although she has a history of trichomonas in the past. Vital signs are reassuring exam is reassuring extensive blood work is reassuring. Patient is not pregnant, no evidence of leukocytosis, dehydration, renal injury, liver injury pancreatitis, anemia, or urinary tract infection at this time. We'll give her antiemetics and reassess. Patient does not appear to be titrated. ____________________________________________   FINAL CLINICAL IMPRESSION(S) / ED DIAGNOSES  Final diagnoses:  None      This chart was dictated using voice recognition software.  Despite best efforts to proofread,  errors can occur which can change meaning.     Schuyler Amor, MD 07/23/15 [phone]

## 2015-07-23 NOTE — Discharge Instructions (Signed)
If you have abdominal pain, bleeding, black stools, high fever or you feel worse in any way, return to the emergency department.

## 2015-07-23 NOTE — ED Notes (Signed)
  Reviewed d/c instructions, follow-up care, and prescriptions with pt. Pt verbalized understanding 

## 2015-08-04 ENCOUNTER — Other Ambulatory Visit: Payer: Self-pay | Admitting: Obstetrics

## 2015-08-04 ENCOUNTER — Encounter: Payer: Self-pay | Admitting: Obstetrics

## 2015-08-04 ENCOUNTER — Ambulatory Visit (INDEPENDENT_AMBULATORY_CARE_PROVIDER_SITE_OTHER): Payer: Medicaid Other | Admitting: Obstetrics

## 2015-08-04 VITALS — BP 110/75 | HR 90 | Temp 97.3°F | Wt 288.0 lb

## 2015-08-04 DIAGNOSIS — Z113 Encounter for screening for infections with a predominantly sexual mode of transmission: Secondary | ICD-10-CM

## 2015-08-04 DIAGNOSIS — Z3202 Encounter for pregnancy test, result negative: Secondary | ICD-10-CM | POA: Diagnosis not present

## 2015-08-04 DIAGNOSIS — N939 Abnormal uterine and vaginal bleeding, unspecified: Secondary | ICD-10-CM

## 2015-08-04 DIAGNOSIS — Z124 Encounter for screening for malignant neoplasm of cervix: Secondary | ICD-10-CM

## 2015-08-04 DIAGNOSIS — Z0000 Encounter for general adult medical examination without abnormal findings: Secondary | ICD-10-CM | POA: Diagnosis not present

## 2015-08-04 DIAGNOSIS — Z3009 Encounter for other general counseling and advice on contraception: Secondary | ICD-10-CM

## 2015-08-04 DIAGNOSIS — Z30011 Encounter for initial prescription of contraceptive pills: Secondary | ICD-10-CM

## 2015-08-04 DIAGNOSIS — Z01419 Encounter for gynecological examination (general) (routine) without abnormal findings: Secondary | ICD-10-CM

## 2015-08-04 LAB — POCT URINE PREGNANCY: PREG TEST UR: NEGATIVE

## 2015-08-04 MED ORDER — NORETHIN ACE-ETH ESTRAD-FE 1-20 MG-MCG(24) PO TABS: 1.0000 | ORAL_TABLET | Freq: Every day | ORAL | 11 refills | Status: DC

## 2015-08-04 NOTE — Progress Notes (Signed)
Patient ID: Latoya Stark, female   DOB: 04-Oct-1995, 20 y.o.   MRN: [id number]   Subjective:        Latoya Stark is a 20 y.o. female here for a routine exam.  Current complaints: Irregular spotting in between periods.  Concerned about weight gain, uncontrolled.  Personal health questionnaire:  Is patient [name], have a family history of breast and/or ovarian cancer: no Is there a family history of uterine cancer diagnosed at age < 21, gastrointestinal cancer, urinary tract cancer, family member who is a Field seismologist syndrome-associated carrier: no Is the patient overweight and hypertensive, family history of diabetes, personal history of gestational diabetes, preeclampsia or PCOS: no Is patient over 84, have PCOS,  family history of premature CHD under age 60, diabetes, smoke, have hypertension or peripheral artery disease:  no At any time, has a partner hit, kicked or otherwise hurt or frightened you?: no Over the past 2 weeks, have you felt down, depressed or hopeless?: no Over the past 2 weeks, have you felt little interest or pleasure in doing things?:no   Gynecologic History Patient's last menstrual period was 06/23/2015 (exact date). Contraception: OCP (estrogen/progesterone) Last Pap: none. Results were: none Last mammogram: n/a. Results were: n/a  Obstetric History OB History  Gravida Para Term Preterm AB Living  2       2    SAB TAB Ectopic Multiple Live Births  2            # Outcome Date GA Lbr Len/2nd Weight Sex Delivery Anes PTL Lv  2 SAB 10/10/14          1 SAB              Birth Comments: System Generated. Please review and update pregnancy details.      Past Medical History:  Diagnosis Date  . ADHD (attention deficit hyperactivity disorder)   . Anemia   . Anxiety   . Asthma   . Depression   . Headache   . Seizures ([place])    "when I get too hot"  . Trichomonas infection   . UTI (lower urinary tract infection)     Past Surgical History:   Procedure Laterality Date  . ADENOIDECTOMY    . DILATION AND EVACUATION N/A 08/09/2013   Procedure: DILATATION AND EVACUATION;  Surgeon: Frederico Hamman, MD;  Location: [facility];  Service: Gynecology;  Laterality: N/A;  . ORTHOPEDIC SURGERY Left    left hand  . TONSILLECTOMY       Current Outpatient Prescriptions:  .  acetaminophen (TYLENOL) 500 MG tablet, Take 1,000 mg by mouth every 6 (six) hours as needed for headache., Disp: , Rfl:  .  ibuprofen (ADVIL,MOTRIN) 800 MG tablet, Take 1 tablet (800 mg total) by mouth 3 (three) times daily., Disp: 21 tablet, Rfl: 0 .  Prenat-FeCbn-FeAspGl-FA-Omega (OB COMPLETE PETITE) 35-5-1-200 MG CAPS, Take 1 capsule by mouth daily before breakfast., Disp: 30 capsule, Rfl: 11 .  promethazine (PHENERGAN) 6.25 MG/5ML syrup, Take 20 mLs (25 mg total) by mouth every 6 (six) hours as needed for nausea or vomiting., Disp: 473 mL, Rfl: 2 .  metoCLOPramide (REGLAN) 10 MG tablet, Take 10 mg by mouth every 8 (eight) hours as needed. for nausea, Disp: , Rfl: 1 .  Norethindrone Acetate-Ethinyl Estrad-FE (LOESTRIN 24 FE) 1-20 MG-MCG(24) tablet, Take 1 tablet by mouth daily., Disp: 1 Package, Rfl: 11 .  ondansetron (ZOFRAN) 4 MG tablet, Take 4 mg by mouth daily as needed., Disp: ,  Rfl: 0 .  pantoprazole (PROTONIX) 20 MG tablet, Take 1 tablet (20 mg total) by mouth daily. (Patient not taking: Reported on 08/04/2015), Disp: 30 tablet, Rfl: 1 .  traMADol (ULTRAM) 50 MG tablet, Take 50 mg by mouth every 6 (six) hours as needed., Disp: , Rfl: 0 Allergies  Allergen Reactions  . Milk-Related Compounds Anaphylaxis    Breaks face out *can have 2% milk*  . Mushroom Extract Complex Anaphylaxis  . Penicillins Anaphylaxis    Throat swells.   Latoya Stark Other (See Comments)    Breaks face out  . Latex Itching and Swelling  . Sulfa Antibiotics Swelling    Social History  Substance Use Topics  . Smoking status: Passive Smoke Exposure - Never Smoker  . Smokeless tobacco: Never  Used  . Alcohol use No    Family History  Problem Relation Age of Onset  . Seizures Mother   . Diabetes Maternal Grandmother   . Colon cancer Maternal Grandmother   . Hypertension Maternal Grandmother   . Clotting disorder Maternal Grandmother       Review of Systems  Constitutional: positive for uncontrolled weight gain Respiratory: negative for cough and wheezing Cardiovascular: negative for chest pain, fatigue and palpitations Gastrointestinal: negative for abdominal pain and change in bowel habits Musculoskeletal:negative for myalgias Neurological: negative for gait problems and tremors Behavioral/Psych: negative for abusive relationship, depression Endocrine: negative for temperature intolerance   Genitourinary:positive for irregular spotting in between periods Integument/breast: negative for breast lump, breast tenderness, nipple discharge and skin lesion(s)    Objective:       BP 110/75   Pulse 90   Temp 97.3 F (36.3 C)   Wt 288 lb (130.6 kg)   LMP 06/23/2015 (Exact Date)   BMI 43.79 kg/m  General:   alert  Skin:   no rash or abnormalities  Lungs:   clear to auscultation bilaterally  Heart:   regular rate and rhythm, S1, S2 normal, no murmur, click, rub or gallop  Breasts:   normal without suspicious masses, skin or nipple changes or axillary nodes  Abdomen:  normal findings: no organomegaly, soft, non-tender and no hernia  Pelvis:  External genitalia: normal general appearance Urinary system: urethral meatus normal and bladder without fullness, nontender Vaginal: normal without tenderness, induration or masses Cervix: normal appearance Adnexa: normal bimanual exam Uterus: anteverted and non-tender, normal size   Lab Review Urine pregnancy test Labs reviewed yes Radiologic studies reviewed no    Assessment:    Healthy female exam.    Contraceptive counseling and advice.  Wants to start OCP's.  Obesity   Plan:    OCP's Rx for cotraception and  regulation of cycles  Recommended weight management consultation including dietary changes and lifestyle modifications for long term weight management  Education reviewed: low fat, low cholesterol diet, safe sex/STD prevention and weight bearing exercise. Contraception: OCP (estrogen/progesterone). Follow up in: 4 months.   Meds ordered this encounter  Medications  . Norethindrone Acetate-Ethinyl Estrad-FE (LOESTRIN 24 FE) 1-20 MG-MCG(24) tablet    Sig: Take 1 tablet by mouth daily.    Dispense:  1 Package    Refill:  11  . metoCLOPramide (REGLAN) 10 MG tablet    Sig: Take 10 mg by mouth every 8 (eight) hours as needed. for nausea    Refill:  1  . ondansetron (ZOFRAN) 4 MG tablet    Sig: Take 4 mg by mouth daily as needed.    Refill:  0  . traMADol (ULTRAM)  50 MG tablet    Sig: Take 50 mg by mouth every 6 (six) hours as needed.    Refill:  0   Orders Placed This Encounter  Procedures  . Comprehensive metabolic panel  . TSH  . HIV antibody  . Hepatitis B surface antigen  . CBC  . RPR  . Hepatitis C antibody  . POCT urine pregnancy

## 2015-08-06 LAB — CBC
HEMOGLOBIN: 12.8 g/dL (ref 11.1–15.9)
Hematocrit: 37.9 % (ref 34.0–46.6)
MCH: 28.9 pg (ref 26.6–33.0)
MCHC: 33.8 g/dL (ref 31.5–35.7)
MCV: 86 fL (ref 79–97)
PLATELETS: 323 10*3/uL (ref 150–379)
RBC: 4.43 x10E6/uL (ref 3.77–5.28)
RDW: 14.3 % (ref 12.3–15.4)
WBC: 4.9 10*3/uL (ref 3.4–10.8)

## 2015-08-06 LAB — RPR: RPR Ser Ql: NONREACTIVE

## 2015-08-06 LAB — HEPATITIS B SURFACE ANTIGEN: Hepatitis B Surface Ag: NEGATIVE

## 2015-08-06 LAB — COMPREHENSIVE METABOLIC PANEL
ALBUMIN: 4.3 g/dL (ref 3.5–5.5)
ALK PHOS: 96 IU/L (ref 39–117)
ALT: 25 IU/L (ref 0–32)
AST: 21 IU/L (ref 0–40)
Albumin/Globulin Ratio: 1.5 (ref 1.2–2.2)
BUN / CREAT RATIO: 12 (ref 9–23)
BUN: 8 mg/dL (ref 6–20)
Bilirubin Total: 0.3 mg/dL (ref 0.0–1.2)
CALCIUM: 9.5 mg/dL (ref 8.7–10.2)
CO2: 24 mmol/L (ref 18–29)
CREATININE: 0.66 mg/dL (ref 0.57–1.00)
Chloride: 103 mmol/L (ref 96–106)
GFR, EST AFRICAN AMERICAN: 146 mL/min/{1.73_m2} (ref >59)
GFR, EST NON AFRICAN AMERICAN: 127 mL/min/{1.73_m2} (ref >59)
GLOBULIN, TOTAL: 2.8 g/dL (ref 1.5–4.5)
GLUCOSE: 99 mg/dL (ref 65–99)
Potassium: 4 mmol/L (ref 3.5–5.2)
SODIUM: 144 mmol/L (ref 134–144)
TOTAL PROTEIN: 7.1 g/dL (ref 6.0–8.5)

## 2015-08-06 LAB — HEPATITIS C ANTIBODY: Hep C Virus Ab: <0.1 s/co ratio (ref 0.0–0.9)

## 2015-08-06 LAB — TSH: TSH: 1.05 u[IU]/mL (ref 0.450–4.500)

## 2015-08-06 LAB — HIV ANTIBODY (ROUTINE TESTING W REFLEX): HIV Screen 4th Generation wRfx: NONREACTIVE

## 2015-08-07 ENCOUNTER — Other Ambulatory Visit: Payer: Self-pay | Admitting: Obstetrics

## 2015-08-07 DIAGNOSIS — B9689 Other specified bacterial agents as the cause of diseases classified elsewhere: Secondary | ICD-10-CM

## 2015-08-07 DIAGNOSIS — B3731 Acute candidiasis of vulva and vagina: Secondary | ICD-10-CM

## 2015-08-07 DIAGNOSIS — N760 Acute vaginitis: Secondary | ICD-10-CM

## 2015-08-07 DIAGNOSIS — B373 Candidiasis of vulva and vagina: Secondary | ICD-10-CM

## 2015-08-07 LAB — NUSWAB VG+, CANDIDA 6SP
ATOPOBIUM VAGINAE: HIGH {score} — AB
BVAB 2: HIGH Score — AB
CANDIDA ALBICANS, NAA: POSITIVE — AB
CANDIDA GLABRATA, NAA: NEGATIVE
CANDIDA KRUSEI, NAA: NEGATIVE
CHLAMYDIA TRACHOMATIS, NAA: NEGATIVE
Candida lusitaniae, NAA: NEGATIVE
Candida parapsilosis, NAA: NEGATIVE
Candida tropicalis, NAA: NEGATIVE
NEISSERIA GONORRHOEAE, NAA: NEGATIVE
TRICH VAG BY NAA: NEGATIVE

## 2015-08-07 MED ORDER — FLUCONAZOLE 150 MG PO TABS: 150.0000 mg | ORAL_TABLET | Freq: Once | ORAL | 2 refills | Status: AC

## 2015-08-07 MED ORDER — METRONIDAZOLE 500 MG PO TABS: 500.0000 mg | ORAL_TABLET | Freq: Two times a day (BID) | ORAL | 2 refills | Status: DC

## 2015-08-09 LAB — PAP IG W/ RFLX HPV ASCU: PAP SMEAR COMMENT: 0

## 2015-09-16 ENCOUNTER — Telehealth: Payer: Self-pay | Admitting: *Deleted

## 2015-09-16 NOTE — Telephone Encounter (Signed)
Talked with patient wants to change her PNV the one she is on makes her sick.

## 2015-11-01 ENCOUNTER — Emergency Department
Admission: EM | Admit: 2015-11-01 | Discharge: 2015-11-01 | Disposition: A | Discharge status: Home / Self Care | Payer: Medicaid Other | Attending: Emergency Medicine | Admitting: Emergency Medicine

## 2015-11-01 ENCOUNTER — Encounter: Payer: Self-pay | Admitting: Emergency Medicine

## 2015-11-01 ENCOUNTER — Emergency Department: Payer: Medicaid Other

## 2015-11-01 DIAGNOSIS — I880 Nonspecific mesenteric lymphadenitis: Secondary | ICD-10-CM | POA: Diagnosis not present

## 2015-11-01 DIAGNOSIS — R51 Headache: Secondary | ICD-10-CM | POA: Insufficient documentation

## 2015-11-01 DIAGNOSIS — R112 Nausea with vomiting, unspecified: Secondary | ICD-10-CM | POA: Diagnosis not present

## 2015-11-01 DIAGNOSIS — R102 Pelvic and perineal pain: Secondary | ICD-10-CM | POA: Diagnosis not present

## 2015-11-01 DIAGNOSIS — F909 Attention-deficit hyperactivity disorder, unspecified type: Secondary | ICD-10-CM | POA: Diagnosis not present

## 2015-11-01 DIAGNOSIS — Z91040 Latex allergy status: Secondary | ICD-10-CM | POA: Insufficient documentation

## 2015-11-01 DIAGNOSIS — R1030 Lower abdominal pain, unspecified: Secondary | ICD-10-CM | POA: Diagnosis present

## 2015-11-01 DIAGNOSIS — Z791 Long term (current) use of non-steroidal anti-inflammatories (NSAID): Secondary | ICD-10-CM | POA: Diagnosis not present

## 2015-11-01 DIAGNOSIS — J45909 Unspecified asthma, uncomplicated: Secondary | ICD-10-CM | POA: Insufficient documentation

## 2015-11-01 DIAGNOSIS — K828 Other specified diseases of gallbladder: Secondary | ICD-10-CM | POA: Diagnosis not present

## 2015-11-01 DIAGNOSIS — Z7722 Contact with and (suspected) exposure to environmental tobacco smoke (acute) (chronic): Secondary | ICD-10-CM | POA: Insufficient documentation

## 2015-11-01 LAB — COMPREHENSIVE METABOLIC PANEL
ALT: 19 U/L (ref 14–54)
AST: 21 U/L (ref 15–41)
Albumin: 4.2 g/dL (ref 3.5–5.0)
Alkaline Phosphatase: 87 U/L (ref 38–126)
Anion gap: 6 (ref 5–15)
BILIRUBIN TOTAL: 0.4 mg/dL (ref 0.3–1.2)
BUN: 10 mg/dL (ref 6–20)
CHLORIDE: 104 mmol/L (ref 101–111)
CO2: 27 mmol/L (ref 22–32)
Calcium: 9.2 mg/dL (ref 8.9–10.3)
Creatinine, Ser: 0.66 mg/dL (ref 0.44–1.00)
Glucose, Bld: 92 mg/dL (ref 65–99)
POTASSIUM: 4.3 mmol/L (ref 3.5–5.1)
Sodium: 137 mmol/L (ref 135–145)
TOTAL PROTEIN: 7.8 g/dL (ref 6.5–8.1)

## 2015-11-01 LAB — CBC
HEMATOCRIT: 38.6 % (ref 35.0–47.0)
Hemoglobin: 13.2 g/dL (ref 12.0–16.0)
MCH: 29.4 pg (ref 26.0–34.0)
MCHC: 34.1 g/dL (ref 32.0–36.0)
MCV: 86.2 fL (ref 80.0–100.0)
PLATELETS: 321 10*3/uL (ref 150–440)
RBC: 4.48 MIL/uL (ref 3.80–5.20)
RDW: 13.7 % (ref 11.5–14.5)
WBC: 5.3 10*3/uL (ref 3.6–11.0)

## 2015-11-01 LAB — URINALYSIS COMPLETE WITH MICROSCOPIC (ARMC ONLY)
BILIRUBIN URINE: NEGATIVE
GLUCOSE, UA: NEGATIVE mg/dL
HGB URINE DIPSTICK: NEGATIVE
Ketones, ur: NEGATIVE mg/dL
LEUKOCYTES UA: NEGATIVE
NITRITE: NEGATIVE
PH: 7 (ref 5.0–8.0)
Protein, ur: NEGATIVE mg/dL
SPECIFIC GRAVITY, URINE: 1.026 (ref 1.005–1.030)

## 2015-11-01 LAB — CHLAMYDIA/NGC RT PCR (ARMC ONLY)
CHLAMYDIA TR: NOT DETECTED
N gonorrhoeae: NOT DETECTED

## 2015-11-01 LAB — WET PREP, GENITAL
CLUE CELLS WET PREP: NONE SEEN
SPERM: NONE SEEN
TRICH WET PREP: NONE SEEN
Yeast Wet Prep HPF POC: NONE SEEN

## 2015-11-01 LAB — LIPASE, BLOOD: LIPASE: 20 U/L (ref 11–51)

## 2015-11-01 MED ORDER — SODIUM CHLORIDE 0.9 % IV BOLUS (SEPSIS)
1000.0000 mL | Freq: Once | INTRAVENOUS | Status: AC
  Administered 2015-11-01: 1000 mL via INTRAVENOUS

## 2015-11-01 MED ORDER — MORPHINE SULFATE (PF) 2 MG/ML IV SOLN
4.0000 mg | Freq: Once | INTRAVENOUS | Status: AC
  Administered 2015-11-01: 4 mg via INTRAVENOUS
  Filled 2015-11-01: qty 2

## 2015-11-01 MED ORDER — IOPAMIDOL (ISOVUE-300) INJECTION 61%
100.0000 mL | Freq: Once | INTRAVENOUS | Status: AC | PRN
  Administered 2015-11-01: 100 mL via INTRAVENOUS

## 2015-11-01 MED ORDER — IBUPROFEN 800 MG PO TABS: 800.0000 mg | ORAL_TABLET | Freq: Four times a day (QID) | ORAL | 0 refills | Status: DC | PRN

## 2015-11-01 MED ORDER — PROMETHAZINE HCL 25 MG/ML IJ SOLN
25.0000 mg | Freq: Once | INTRAMUSCULAR | Status: AC
  Administered 2015-11-01: 25 mg via INTRAVENOUS
  Filled 2015-11-01: qty 1

## 2015-11-01 MED ORDER — IOPAMIDOL (ISOVUE-300) INJECTION 61%
30.0000 mL | Freq: Once | INTRAVENOUS | Status: AC | PRN
  Administered 2015-11-01: 30 mL via ORAL

## 2015-11-01 MED ORDER — PROMETHAZINE HCL 6.25 MG/5ML PO SYRP: 25.0000 mg | ORAL_SOLUTION | Freq: Four times a day (QID) | ORAL | 1 refills | Status: DC | PRN

## 2015-11-01 NOTE — ED Notes (Signed)
Pt reports abdominal pain for the past 4 days. Pt reports pain is constant on the RLQ and intermittent on the LLQ. Pt reports vomitted x's 1 yesterday and 1 episode of diarrhea. Pt last BM was yesterday. Pt denies fevers, vaginal discharge, bleeding, or urinary symptoms.

## 2015-11-01 NOTE — ED Provider Notes (Signed)
[facility] Provider Note   CSN: [id number] Arrival date & time: 11/01/15  0636     History   Chief Complaint Chief Complaint  Patient presents with  . Abdominal Pain    HPI PRYSCILLA AMANN is a 20 y.o. female hx of trichomonas, UTI, Asthma, here presenting with lower abdominal pain. Lower abdominal pain since yesterday. Associated with some nausea and vomiting and some headaches. Also had one loose stool as well. She is sexually active with one partner. Has some baseline whitish discharge that is unchanged. Has hx of fibroids with similar symptoms but has no vaginal bleeding currently.     The history is provided by the patient.    Past Medical History:  Diagnosis Date  . ADHD (attention deficit hyperactivity disorder)   . Anemia   . Anxiety   . Asthma   . Depression   . Headache   . Seizures ([place])    "when I get too hot"  . Trichomonas infection   . UTI (lower urinary tract infection)     Patient Active Problem List   Diagnosis Date Noted  . Constipation 02/24/2014  . CN (constipation) 12/23/2013  . Gonorrhea in female 10/05/2013  . BV (bacterial vaginosis) 09/22/2013  . Candidiasis of vulva and vagina 09/22/2013    Past Surgical History:  Procedure Laterality Date  . ADENOIDECTOMY    . DILATION AND EVACUATION N/A 08/09/2013   Procedure: DILATATION AND EVACUATION;  Surgeon: Frederico Hamman, MD;  Location: [facility];  Service: Gynecology;  Laterality: N/A;  . ORTHOPEDIC SURGERY Left    left hand  . TONSILLECTOMY      OB History    Gravida Para Term Preterm AB Living   2       2     SAB TAB Ectopic Multiple Live Births   2               Home Medications    Prior to Admission medications   Medication Sig Start Date End Date Taking? Authorizing Provider  acetaminophen (TYLENOL) 500 MG tablet Take 1,000 mg by mouth every 6 (six) hours as needed for headache.   Yes Historical Provider, MD  Norethindrone Acetate-Ethinyl Estrad-FE (LOESTRIN  24 FE) 1-20 MG-MCG(24) tablet Take 1 tablet by mouth daily. 08/04/15  Yes Shelly Bombard, MD  Prenat-FeCbn-FeAspGl-FA-Omega (OB COMPLETE PETITE) 35-5-1-200 MG CAPS Take 1 capsule by mouth daily before breakfast. Patient not taking: Reported on 11/01/2015 01/29/15   Shelly Bombard, MD    Family History Family History  Problem Relation Age of Onset  . Seizures Mother   . Diabetes Maternal Grandmother   . Colon cancer Maternal Grandmother   . Hypertension Maternal Grandmother   . Clotting disorder Maternal Grandmother     Social History Social History  Substance Use Topics  . Smoking status: Passive Smoke Exposure - Never Smoker  . Smokeless tobacco: Never Used  . Alcohol use No     Allergies   Milk-related compounds; Mushroom extract complex; Penicillins; Cheese; Latex; and Sulfa antibiotics   Review of Systems Review of Systems  Gastrointestinal: Positive for abdominal pain and vomiting.  All other systems reviewed and are negative.    Physical Exam Updated Vital Signs BP 110/76   Pulse (!) 56   Temp 98.2 F (36.8 C) (Oral)   Resp 19   Ht 5\' 3"  (1.6 m)   Wt 220 lb (99.8 kg)   LMP 10/25/2015   SpO2 100%   BMI 38.97 kg/m  Physical Exam  Constitutional: She is oriented to person, place, and time.  Slightly uncomfortable   HENT:  Head: Normocephalic.  Eyes: Pupils are equal, round, and reactive to light.  Neck: Normal range of motion.  Cardiovascular: Normal rate, regular rhythm and normal heart sounds.   Pulmonary/Chest: Effort normal and breath sounds normal. No respiratory distress. She has no wheezes.  Abdominal: Soft.  Obese. + mild diffuse lower abdominal tenderness, no rebound   Genitourinary:  Genitourinary Comments: Whitish discharge (baseline per patient). No adnexal tenderness or CMT. Mild uterine tenderness   Musculoskeletal: Normal range of motion.  Neurological: She is alert and oriented to person, place, and time.  Skin: Skin is warm.    Psychiatric: She has a normal mood and affect.  Nursing note and vitals reviewed.    ED Treatments / Results  Labs (all labs ordered are listed, but only abnormal results are displayed) Labs Reviewed  WET PREP, GENITAL - Abnormal; Notable for the following:       Result Value   WBC, Wet Prep HPF POC FEW (*)    All other components within normal limits  CHLAMYDIA/NGC RT PCR ([facility] ONLY)  LIPASE, BLOOD  COMPREHENSIVE METABOLIC PANEL  CBC  URINALYSIS COMPLETEWITH MICROSCOPIC [facility] ONLY)    EKG  EKG Interpretation None       Radiology US Transvaginal Non-ob  Result Date: 11/01/2015 CLINICAL DATA:  Acute pelvic pain. EXAM: TRANSABDOMINAL AND TRANSVAGINAL ULTRASOUND OF PELVIS DOPPLER ULTRASOUND OF OVARIES TECHNIQUE: Both transabdominal and transvaginal ultrasound examinations of the pelvis were performed. Transabdominal technique was performed for global imaging of the pelvis including uterus, ovaries, adnexal regions, and pelvic cul-de-sac. It was necessary to proceed with endovaginal exam following the transabdominal exam to visualize the endometrium and left ovary. Color and duplex Doppler ultrasound was utilized to evaluate blood flow to the ovaries. COMPARISON:  Ultrasound of May 17, 2015. FINDINGS: Uterus Measurements: 6.1 x 4.8 x 3.6 cm. No fibroids or other mass visualized. Endometrium Thickness: 3.8 mm which is within normal limits. No focal abnormality visualized. Right ovary Measurements: 4.4 x 2.1 x 2.5 cm. Normal appearance/no adnexal mass. Left ovary Measurements: 4.3 x 2.3 x 1.9 cm. Normal appearance/no adnexal mass. Pulsed Doppler evaluation of both ovaries demonstrates normal low-resistance arterial and venous waveforms. Other findings Small amount of free fluid which most likely is physiologic. IMPRESSION: No significant abnormality seen in the pelvis. Electronically Signed   By: Marijo Conception, M.D.   On: 11/01/2015 11:01   US Pelvis Complete  Result Date:  11/01/2015 CLINICAL DATA:  Acute pelvic pain. EXAM: TRANSABDOMINAL AND TRANSVAGINAL ULTRASOUND OF PELVIS DOPPLER ULTRASOUND OF OVARIES TECHNIQUE: Both transabdominal and transvaginal ultrasound examinations of the pelvis were performed. Transabdominal technique was performed for global imaging of the pelvis including uterus, ovaries, adnexal regions, and pelvic cul-de-sac. It was necessary to proceed with endovaginal exam following the transabdominal exam to visualize the endometrium and left ovary. Color and duplex Doppler ultrasound was utilized to evaluate blood flow to the ovaries. COMPARISON:  Ultrasound of May 17, 2015. FINDINGS: Uterus Measurements: 6.1 x 4.8 x 3.6 cm. No fibroids or other mass visualized. Endometrium Thickness: 3.8 mm which is within normal limits. No focal abnormality visualized. Right ovary Measurements: 4.4 x 2.1 x 2.5 cm. Normal appearance/no adnexal mass. Left ovary Measurements: 4.3 x 2.3 x 1.9 cm. Normal appearance/no adnexal mass. Pulsed Doppler evaluation of both ovaries demonstrates normal low-resistance arterial and venous waveforms. Other findings Small amount of free fluid which most likely  is physiologic. IMPRESSION: No significant abnormality seen in the pelvis. Electronically Signed   By: Marijo Conception, M.D.   On: 11/01/2015 11:01   [place] Art/ven Flow Abd Pelv Doppler  Result Date: 11/01/2015 CLINICAL DATA:  Acute pelvic pain. EXAM: TRANSABDOMINAL AND TRANSVAGINAL ULTRASOUND OF PELVIS DOPPLER ULTRASOUND OF OVARIES TECHNIQUE: Both transabdominal and transvaginal ultrasound examinations of the pelvis were performed. Transabdominal technique was performed for global imaging of the pelvis including uterus, ovaries, adnexal regions, and pelvic cul-de-sac. It was necessary to proceed with endovaginal exam following the transabdominal exam to visualize the endometrium and left ovary. Color and duplex Doppler ultrasound was utilized to evaluate blood flow to the ovaries.  COMPARISON:  Ultrasound of May 17, 2015. FINDINGS: Uterus Measurements: 6.1 x 4.8 x 3.6 cm. No fibroids or other mass visualized. Endometrium Thickness: 3.8 mm which is within normal limits. No focal abnormality visualized. Right ovary Measurements: 4.4 x 2.1 x 2.5 cm. Normal appearance/no adnexal mass. Left ovary Measurements: 4.3 x 2.3 x 1.9 cm. Normal appearance/no adnexal mass. Pulsed Doppler evaluation of both ovaries demonstrates normal low-resistance arterial and venous waveforms. Other findings Small amount of free fluid which most likely is physiologic. IMPRESSION: No significant abnormality seen in the pelvis. Electronically Signed   By: Marijo Conception, M.D.   On: 11/01/2015 11:01    Procedures Procedures (including critical care time)  Medications Ordered in ED Medications  morphine 2 MG/ML injection 4 mg (4 mg Intravenous Given 11/01/15 1108)  promethazine (PHENERGAN) injection 25 mg (25 mg Intravenous Given 11/01/15 1103)  sodium chloride 0.9 % bolus 1,000 mL (1,000 mLs Intravenous New Bag/Given 11/01/15 1101)  iopamidol (ISOVUE-300) 61 % injection 30 mL (30 mLs Oral Contrast Given 11/01/15 1158)     Initial Impression / Assessment and Plan / ED Course  I have reviewed the triage vital signs and the nursing notes.  Pertinent labs & imaging results that were available during my care of the patient were reviewed by me and considered in my medical decision making (see chart for details).  Clinical Course    Chrishauna Bernheisel Thunder is a 20 y.o. female here with abdominal pain. Consider fibroids vs gastro. Pelvic exam showed mild uterine tenderness and has hx of fibroids so will get [place] first. If neg, will reassess and consider CT.   1:33 PM US showed no fibroids. CT showed mesenteric adenitis. Has some calcifications of gallbladder wall and recommend yearly [place]. No vomiting in the ED. Will dc home with motrin, phenergan.    Final Clinical Impressions(s) / ED Diagnoses   Final diagnoses:   Pelvic pain  Pelvic pain    New Prescriptions New Prescriptions   No medications on file     Drenda Freeze, MD 11/01/15 1334

## 2015-11-01 NOTE — ED Notes (Signed)
Patient transported to Ultrasound 

## 2015-11-01 NOTE — Discharge Instructions (Signed)
Stay hydrated.   Take motrin as prescribed for 2 days then as needed.   Take phenergan for nausea.   See your doctor.   You have calcification of the gallbladder wall and need annual ultrasound to follow up   Return to ER if you have severe pain, vomiting, fevers.

## 2015-11-01 NOTE — ED Triage Notes (Signed)
[place] started to have increased lower abdominal pain. Pt states that she has had fibroids before and that this pain is similar. Pt is alert and oriented at this time in triage.

## 2015-12-06 ENCOUNTER — Ambulatory Visit: Payer: Medicaid Other | Admitting: Obstetrics

## 2015-12-07 ENCOUNTER — Encounter: Payer: Self-pay | Admitting: Emergency Medicine

## 2015-12-07 ENCOUNTER — Emergency Department
Admission: EM | Admit: 2015-12-07 | Discharge: 2015-12-07 | Disposition: A | Discharge status: Home / Self Care | Payer: Medicaid Other | Attending: Emergency Medicine | Admitting: Emergency Medicine

## 2015-12-07 ENCOUNTER — Ambulatory Visit: Payer: Medicaid Other | Admitting: Obstetrics

## 2015-12-07 DIAGNOSIS — Z91040 Latex allergy status: Secondary | ICD-10-CM | POA: Insufficient documentation

## 2015-12-07 DIAGNOSIS — G43009 Migraine without aura, not intractable, without status migrainosus: Secondary | ICD-10-CM | POA: Diagnosis not present

## 2015-12-07 DIAGNOSIS — Z791 Long term (current) use of non-steroidal anti-inflammatories (NSAID): Secondary | ICD-10-CM | POA: Insufficient documentation

## 2015-12-07 DIAGNOSIS — J45909 Unspecified asthma, uncomplicated: Secondary | ICD-10-CM | POA: Insufficient documentation

## 2015-12-07 DIAGNOSIS — F909 Attention-deficit hyperactivity disorder, unspecified type: Secondary | ICD-10-CM | POA: Insufficient documentation

## 2015-12-07 DIAGNOSIS — G43909 Migraine, unspecified, not intractable, without status migrainosus: Secondary | ICD-10-CM | POA: Diagnosis present

## 2015-12-07 DIAGNOSIS — Z7722 Contact with and (suspected) exposure to environmental tobacco smoke (acute) (chronic): Secondary | ICD-10-CM | POA: Insufficient documentation

## 2015-12-07 DIAGNOSIS — Z79899 Other long term (current) drug therapy: Secondary | ICD-10-CM | POA: Diagnosis not present

## 2015-12-07 MED ORDER — ONDANSETRON HCL 4 MG PO TABS
8.0000 mg | ORAL_TABLET | Freq: Once | ORAL | Status: AC
  Administered 2015-12-07: 8 mg via ORAL
  Filled 2015-12-07: qty 2

## 2015-12-07 MED ORDER — KETOROLAC TROMETHAMINE 60 MG/2ML IM SOLN
60.0000 mg | Freq: Once | INTRAMUSCULAR | Status: AC
  Administered 2015-12-07: 60 mg via INTRAMUSCULAR
  Filled 2015-12-07: qty 2

## 2015-12-07 MED ORDER — SUMATRIPTAN SUCCINATE 6 MG/0.5ML ~~LOC~~ SOLN
6.0000 mg | Freq: Once | SUBCUTANEOUS | Status: AC
  Administered 2015-12-07: 6 mg via SUBCUTANEOUS
  Filled 2015-12-07: qty 0.5

## 2015-12-07 MED ORDER — NAPROXEN 500 MG PO TABS: 500.0000 mg | ORAL_TABLET | Freq: Two times a day (BID) | ORAL | 0 refills | Status: DC

## 2015-12-07 MED ORDER — SUMATRIPTAN SUCCINATE 50 MG PO TABS: 50.0000 mg | ORAL_TABLET | Freq: Once | ORAL | 0 refills | Status: DC | PRN

## 2015-12-07 MED ORDER — BUTALBITAL-APAP-CAFFEINE 50-325-40 MG PO TABS
1.0000 | ORAL_TABLET | Freq: Once | ORAL | Status: AC
  Administered 2015-12-07: 1 via ORAL
  Filled 2015-12-07: qty 1

## 2015-12-07 MED ORDER — OXYCODONE-ACETAMINOPHEN 5-325 MG PO TABS
1.0000 | ORAL_TABLET | Freq: Once | ORAL | Status: AC
  Administered 2015-12-07: 1 via ORAL
  Filled 2015-12-07: qty 1

## 2015-12-07 NOTE — ED Triage Notes (Signed)
Patient presents to the ED with migraine.  Patient reports taking ibuprofen and tylenol for migraine without improvement.  Patient reports history of migraines.  Patient reports nausea.  Patient is watching movie on smartphone during triage.  Patient is in no obvious distress at this time.

## 2015-12-07 NOTE — ED Provider Notes (Signed)
[facility] Emergency Department Provider Note ____________________________________________  Time seen: Approximately 5:10 PM  I have reviewed the triage vital signs and the nursing notes.   HISTORY  Chief Complaint Migraine   HPI Latoya Stark is a 20 y.o. female that presents with migraine for 1 day. Patient has light sensitivity. Patient has been nauseous all day but has not vomited. Patient has had migraines since she was 11. Patient has been worked up for migraines in the past. This migraine is similar to previous migraines.  Patient took ibuprofen last night and tylenol today without improvement. No visual changes or sound sensitivity.    Past Medical History:  Diagnosis Date  . ADHD (attention deficit hyperactivity disorder)   . Anemia   . Anxiety   . Asthma   . Depression   . Headache   . Seizures ([place])    "when I get too hot"  . Trichomonas infection   . UTI (lower urinary tract infection)     Patient Active Problem List   Diagnosis Date Noted  . Constipation 02/24/2014  . CN (constipation) 12/23/2013  . Gonorrhea in female 10/05/2013  . BV (bacterial vaginosis) 09/22/2013  . Candidiasis of vulva and vagina 09/22/2013    Past Surgical History:  Procedure Laterality Date  . ADENOIDECTOMY    . DILATION AND EVACUATION N/A 08/09/2013   Procedure: DILATATION AND EVACUATION;  Surgeon: Frederico Hamman, MD;  Location: [facility];  Service: Gynecology;  Laterality: N/A;  . ORTHOPEDIC SURGERY Left    left hand  . TONSILLECTOMY      Prior to Admission medications   Medication Sig Start Date End Date Taking? Authorizing Provider  acetaminophen (TYLENOL) 500 MG tablet Take 1,000 mg by mouth every 6 (six) hours as needed for headache.    Historical Provider, MD  ibuprofen (ADVIL,MOTRIN) 800 MG tablet Take 1 tablet (800 mg total) by mouth every 6 (six) hours as needed. 11/01/15   Drenda Freeze, MD  naproxen (NAPROSYN) 500 MG tablet Take 1  tablet (500 mg total) by mouth 2 (two) times daily with a meal. 12/07/15 12/06/16  Laban Emperor, PA-C  Norethindrone Acetate-Ethinyl Estrad-FE (LOESTRIN 24 FE) 1-20 MG-MCG(24) tablet Take 1 tablet by mouth daily. 08/04/15   Shelly Bombard, MD  Prenat-FeCbn-FeAspGl-FA-Omega (OB COMPLETE PETITE) 35-5-1-200 MG CAPS Take 1 capsule by mouth daily before breakfast. Patient not taking: Reported on 11/01/2015 01/29/15   Shelly Bombard, MD  promethazine (PHENERGAN) 6.25 MG/5ML syrup Take 20 mLs (25 mg total) by mouth 4 (four) times daily as needed for nausea or vomiting. 11/01/15 10/31/16  Drenda Freeze, MD  SUMAtriptan (IMITREX) 50 MG tablet Take 1 tablet (50 mg total) by mouth once as needed for migraine. May repeat in 2 hours if headache persists or recurs. 12/07/15 12/12/15  Laban Emperor, PA-C    Allergies Milk-related compounds; Mushroom extract complex; Penicillins; Cheese; Latex; and Sulfa antibiotics  Family History  Problem Relation Age of Onset  . Seizures Mother   . Diabetes Maternal Grandmother   . Colon cancer Maternal Grandmother   . Hypertension Maternal Grandmother   . Clotting disorder Maternal Grandmother     Social History Social History  Substance Use Topics  . Smoking status: Passive Smoke Exposure - Never Smoker  . Smokeless tobacco: Never Used  . Alcohol use No    Review of Systems Constitutional: No fever/chills or recent injury. Eyes: No visual changes. Cardiovascular: No chest pain.  Respiratory: Denies shortness of breath. Gastrointestinal:  No vomiting.  No diarrhea.  No constipation. Musculoskeletal: Negative for pain. Skin: Negative for rash. Neurological:Positive for headache, Negative for focal weakness or numbness. No confusion or fainting. ___________________________________________   PHYSICAL EXAM:  VITAL SIGNS: ED Triage Vitals [12/07/15 1643]  Enc Vitals Group     BP (!) 108/49     Pulse Rate 99     Resp 18     Temp 98.8 F (37.1 C)      Temp Source Oral     SpO2 98 %     Weight 220 lb (99.8 kg)     Height 5\' 3"  (1.6 m)     Head Circumference      Peak Flow      Pain Score 10     Pain Loc      Pain Edu?      Excl. in [place]?     Constitutional: Alert and oriented. Well appearing and in no acute distress. Eyes: Conjunctivae are normal. PERRL. EOMI. No evidence of papilledema on limited exam. Head: Atraumatic. Nose: No congestion/rhinnorhea. Mouth/Throat: Mucous membranes are moist.  Oropharynx non-erythematous. Neck: No stridor. No meningismus.   Cardiovascular: Normal rate, regular rhythm. Grossly normal heart sounds.  Good peripheral circulation. Respiratory: Normal respiratory effort.  No retractions. Lungs CTAB. Musculoskeletal: No lower extremity tenderness nor edema.  No joint effusions. Neurologic:  Normal speech and language. No gross focal neurologic deficits are appreciated. Cranial nerves: 2-10 normal as tested. Cerebellar:Normal Romberg, finger-nose-finger, normal gait. Sensorimotor: No aphasia, pronator drift, clonus, sensory loss or abnormal reflexes.  Skin:  Skin is warm, dry and intact. No rash noted. Psychiatric: Mood and affect are normal. Speech and behavior are normal. Normal thought process and cognition.  ____________________________________________   LABS (all labs ordered are listed, but only abnormal results are displayed)  Labs Reviewed - No data to display ____________________________________________  EKG   RADIOLOGY    PROCEDURES   Critical Care performed: No  ____________________________________________   INITIAL IMPRESSION / ASSESSMENT AND PLAN / ED COURSE  Clinical Course    Patient was given Imitrex and Zofran.  At 545, nausea has improved. Migraine moved from front of head to back of head. Patient was then given Tordol and [place]. At 615, migraine improving but not resolved. Patient was then given a Percocet. Patient was comfortable and eager to go home and sleep  for the night. Patient was advised to return to ED if symptoms change or worsen. Patient was advised to follow up with PCP and neurology for long term plan for symptom control. No indication for imagining since migraine is same as previous migraines, patient has been worked up for migraines in the past, and has a normal neuro exam. No evidence of subarachnoid hemorrhage, meningitis, hematoma, CVA.  Pertinent labs & imaging results that were available during my care of the patient were reviewed by me and considered in my medical decision making (see chart for details). ____________________________________________   FINAL CLINICAL IMPRESSION(S) / ED DIAGNOSES  Final diagnoses:  Migraine without aura and without status migrainosus, not intractable     Laban Emperor, PA-C 12/07/15 2144    Eula Listen, MD 12/07/15 2250

## 2015-12-07 NOTE — ED Notes (Signed)
Pt reports migraine headache since last night - pt has history of migraines but is not treated for them - Pt denies vomiting but reports nausea all day - reports photo sensitivity - pt took IBU, Tyl, and tramadol without relief

## 2015-12-09 ENCOUNTER — Emergency Department
Admission: EM | Admit: 2015-12-09 | Discharge: 2015-12-09 | Disposition: A | Discharge status: Home / Self Care | Payer: Medicaid Other | Attending: Emergency Medicine | Admitting: Emergency Medicine

## 2015-12-09 ENCOUNTER — Encounter: Payer: Self-pay | Admitting: Emergency Medicine

## 2015-12-09 DIAGNOSIS — F909 Attention-deficit hyperactivity disorder, unspecified type: Secondary | ICD-10-CM | POA: Diagnosis not present

## 2015-12-09 DIAGNOSIS — G43901 Migraine, unspecified, not intractable, with status migrainosus: Secondary | ICD-10-CM | POA: Diagnosis not present

## 2015-12-09 DIAGNOSIS — Z7722 Contact with and (suspected) exposure to environmental tobacco smoke (acute) (chronic): Secondary | ICD-10-CM | POA: Diagnosis not present

## 2015-12-09 DIAGNOSIS — Z791 Long term (current) use of non-steroidal anti-inflammatories (NSAID): Secondary | ICD-10-CM | POA: Insufficient documentation

## 2015-12-09 DIAGNOSIS — Z79899 Other long term (current) drug therapy: Secondary | ICD-10-CM | POA: Diagnosis not present

## 2015-12-09 DIAGNOSIS — G40909 Epilepsy, unspecified, not intractable, without status epilepticus: Secondary | ICD-10-CM | POA: Diagnosis not present

## 2015-12-09 DIAGNOSIS — R51 Headache: Secondary | ICD-10-CM | POA: Diagnosis present

## 2015-12-09 DIAGNOSIS — J45909 Unspecified asthma, uncomplicated: Secondary | ICD-10-CM | POA: Insufficient documentation

## 2015-12-09 LAB — POCT PREGNANCY, URINE: Preg Test, Ur: NEGATIVE

## 2015-12-09 MED ORDER — PHENYTOIN SODIUM EXTENDED 100 MG PO CAPS
400.0000 mg | ORAL_CAPSULE | Freq: Three times a day (TID) | ORAL | Status: DC
  Administered 2015-12-09: 400 mg via ORAL

## 2015-12-09 MED ORDER — MAGNESIUM SULFATE 2 GM/50ML IV SOLN
2.0000 g | Freq: Once | INTRAVENOUS | Status: AC
  Administered 2015-12-09: 2 g via INTRAVENOUS
  Filled 2015-12-09: qty 50

## 2015-12-09 MED ORDER — SODIUM CHLORIDE 0.9 % IV BOLUS (SEPSIS)
1000.0000 mL | Freq: Once | INTRAVENOUS | Status: AC
  Administered 2015-12-09: 1000 mL via INTRAVENOUS

## 2015-12-09 MED ORDER — PHENYTOIN SODIUM EXTENDED 100 MG PO CAPS
ORAL_CAPSULE | ORAL | Status: AC
  Filled 2015-12-09: qty 4

## 2015-12-09 MED ORDER — PHENYTOIN SODIUM EXTENDED 100 MG PO CAPS: 100.0000 mg | ORAL_CAPSULE | Freq: Three times a day (TID) | ORAL | 0 refills | Status: DC

## 2015-12-09 MED ORDER — PHENYTOIN SODIUM EXTENDED 100 MG PO CAPS: ORAL_CAPSULE | ORAL | 0 refills | Status: DC

## 2015-12-09 MED ORDER — DEXAMETHASONE SODIUM PHOSPHATE 4 MG/ML IJ SOLN
10.0000 mg | Freq: Once | INTRAMUSCULAR | Status: AC
  Administered 2015-12-09: 10 mg via INTRAVENOUS
  Filled 2015-12-09 (×2): qty 2.5

## 2015-12-09 MED ORDER — PROCHLORPERAZINE EDISYLATE 5 MG/ML IJ SOLN
10.0000 mg | Freq: Once | INTRAMUSCULAR | Status: AC
  Administered 2015-12-09: 10 mg via INTRAVENOUS
  Filled 2015-12-09: qty 2

## 2015-12-09 MED ORDER — DIPHENHYDRAMINE HCL 50 MG/ML IJ SOLN
25.0000 mg | Freq: Once | INTRAMUSCULAR | Status: AC
  Administered 2015-12-09: 25 mg via INTRAVENOUS
  Filled 2015-12-09: qty 1

## 2015-12-09 NOTE — ED Notes (Signed)
Pt calling for ride

## 2015-12-09 NOTE — ED Notes (Signed)
Pt sound asleep.. VSS.. Will continue to monitor the pt.

## 2015-12-09 NOTE — ED Notes (Signed)
Pt ride called. Pt wheeeled to lobby to wait.

## 2015-12-09 NOTE — ED Provider Notes (Signed)
[facility] Emergency Department Provider Note ____________________________________________   I have reviewed the triage vital signs and the triage nursing note.  HISTORY  Chief Complaint Headache   Historian Patient  HPI Latoya Stark is a 20 y.o. female with a history of migraine headaches since she was 20 years old, for which she previously took topamax, but has been off since 20 years old, presents with headache 10/10, gradual onset and very light sensitive, similar to prior migraine headaches. Occasionally has blurry vision with migraines and has with this headache.  No weakness or numbness.  No sinus congestion.  No fevers, or neck pain or stiffness.  Headache located all over.  States her boyfriend reported seizure yesterday overnight. She has a history of seizures for which she was previously on dilantin as a child and stopped when she turned 59 years old.  States she's had several seizures over the past few years off dilantin, but does not have an adult doctor or a neurologist at this point.  Was seen in the ER recently with this headache and states she got some improvement but ibuprofen and sumatriptan not working at home.    Past Medical History:  Diagnosis Date  . ADHD (attention deficit hyperactivity disorder)   . Anemia   . Anxiety   . Asthma   . Depression   . Headache   . Seizures ([place])    "when I get too hot"  . Trichomonas infection   . UTI (lower urinary tract infection)     Patient Active Problem List   Diagnosis Date Noted  . Constipation 02/24/2014  . CN (constipation) 12/23/2013  . Gonorrhea in female 10/05/2013  . BV (bacterial vaginosis) 09/22/2013  . Candidiasis of vulva and vagina 09/22/2013    Past Surgical History:  Procedure Laterality Date  . ADENOIDECTOMY    . DILATION AND EVACUATION N/A 08/09/2013   Procedure: DILATATION AND EVACUATION;  Surgeon: Frederico Hamman, MD;  Location: [facility];  Service: Gynecology;   Laterality: N/A;  . ORTHOPEDIC SURGERY Left    left hand  . TONSILLECTOMY      Prior to Admission medications   Medication Sig Start Date End Date Taking? Authorizing Provider  naproxen (NAPROSYN) 500 MG tablet Take 1 tablet (500 mg total) by mouth 2 (two) times daily with a meal. 12/07/15 12/06/16 Yes Laban Emperor, PA-C  SUMAtriptan (IMITREX) 50 MG tablet Take 1 tablet (50 mg total) by mouth once as needed for migraine. May repeat in 2 hours if headache persists or recurs. 12/07/15 12/12/15 Yes Laban Emperor, PA-C  acetaminophen (TYLENOL) 500 MG tablet Take 1,000 mg by mouth every 6 (six) hours as needed for headache.    Historical Provider, MD  ibuprofen (ADVIL,MOTRIN) 800 MG tablet Take 1 tablet (800 mg total) by mouth every 6 (six) hours as needed. 11/01/15   Drenda Freeze, MD  Norethindrone Acetate-Ethinyl Estrad-FE (LOESTRIN 24 FE) 1-20 MG-MCG(24) tablet Take 1 tablet by mouth daily. 08/04/15   Shelly Bombard, MD  phenytoin (DILANTIN) 100 MG ER capsule Take 1 capsule (100 mg total) by mouth 3 (three) times daily. 12/09/15   Lisa Roca, MD  phenytoin (DILANTIN) 100 MG ER capsule Take 300mg  (3 tablets) at 5pm 12/09/15 - today Take 300mg  (3 tablets) at 8pm 12/09/15 - today 12/09/15 12/10/15  Lisa Roca, MD  Prenat-FeCbn-FeAspGl-FA-Omega (OB COMPLETE PETITE) 35-5-1-200 MG CAPS Take 1 capsule by mouth daily before breakfast. Patient not taking: Reported on 12/09/2015 01/29/15   Clenton Pare  Jodi Mourning, MD  promethazine (PHENERGAN) 6.25 MG/5ML syrup Take 20 mLs (25 mg total) by mouth 4 (four) times daily as needed for nausea or vomiting. 11/01/15 10/31/16  Drenda Freeze, MD    Allergies  Allergen Reactions  . Milk-Related Compounds Anaphylaxis    Breaks face out *can have 2% milk*  . Mushroom Extract Complex Anaphylaxis  . Penicillins Anaphylaxis    Has patient had a PCN reaction causing immediate rash, facial/tongue/throat swelling, SOB or lightheadedness with hypotension: Yes Has  patient had a PCN reaction causing severe rash involving mucus membranes or skin necrosis: No Has patient had a PCN reaction that required hospitalization No Has patient had a PCN reaction occurring within the last 10 years: Yes If all of the above answers are "NO", then may proceed with Cephalosporin use.   [name] Other (See Comments)    Breaks face out  . Latex Itching and Swelling  . Sulfa Antibiotics Swelling    Family History  Problem Relation Age of Onset  . Seizures Mother   . Diabetes Maternal Grandmother   . Colon cancer Maternal Grandmother   . Hypertension Maternal Grandmother   . Clotting disorder Maternal Grandmother     Social History Social History  Substance Use Topics  . Smoking status: Passive Smoke Exposure - Never Smoker  . Smokeless tobacco: Never Used  . Alcohol use No    Review of Systems  Constitutional: Negative for fever. Eyes: as per hpi ENT: Negative for sore throat or congestion. Cardiovascular: Negative for chest pain. Respiratory: Negative for shortness of breath. Gastrointestinal: Negative for abdominal pain, vomiting and diarrhea. Genitourinary: Negative for dysuria. Musculoskeletal: Negative for back pain. Skin: Negative for rash. Neurological: Positive for headache. 10 point Review of Systems otherwise negative ____________________________________________   PHYSICAL EXAM:  VITAL SIGNS: ED Triage Vitals  Enc Vitals Group     BP 12/09/15 0752 127/77     Pulse Rate 12/09/15 0752 83     Resp 12/09/15 0752 18     Temp 12/09/15 0752 97.8 F (36.6 C)     Temp Source 12/09/15 0752 Oral     SpO2 12/09/15 0752 100 %     Weight 12/09/15 0752 220 lb (99.8 kg)     Height --      Head Circumference --      Peak Flow --      Pain Score 12/09/15 0753 10     Pain Loc --      Pain Edu? --      Excl. in Optima? --      Constitutional: Alert and oriented. Well appearing and in no distress. HEENT   Head: Normocephalic and  atraumatic.      Eyes: Conjunctivae are normal. PERRL. Normal extraocular movements. Somewhat limited fundoscopic exam as patient not tolerating it, and pupils small, but portion of optic disc visualized does not appear to have papilledema      Ears:         Nose: No congestion/rhinnorhea.   Mouth/Throat: Mucous membranes are moist.   Neck: No stridor. Cardiovascular/Chest: Normal rate, regular rhythm.  No murmurs, rubs, or gallops. Respiratory: Normal respiratory effort without tachypnea nor retractions. Breath sounds are clear and equal bilaterally. No wheezes/rales/rhonchi. Gastrointestinal: Soft. No distention, no guarding, no rebound. Nontender.  Obese Genitourinary/rectal:Deferred Musculoskeletal: Nontender with normal range of motion in all extremities. No joint effusions.  No lower extremity tenderness.  No edema. Neurologic:  Normal speech and language. No gross or focal neurologic deficits  are appreciated. Skin:  Skin is warm, dry and intact. No rash noted. Psychiatric: Mood and affect are normal. Speech and behavior are normal. Patient exhibits appropriate insight and judgment.   ____________________________________________  LABS (pertinent positives/negatives)  Labs Reviewed - No data to display  ____________________________________________    EKG I, Lisa Roca, MD, the attending physician have personally viewed and interpreted all ECGs.  None ____________________________________________  RADIOLOGY All Xrays were viewed by me. Imaging interpreted by Radiologist.  None __________________________________________  PROCEDURES  Procedure(s) performed: None  Critical Care performed: None  ____________________________________________   ED COURSE / ASSESSMENT AND PLAN  Pertinent labs & imaging results that were available during my care of the patient were reviewed by me and considered in my medical decision making (see chart for details).   Ms. Barrionuevo  is here with headache she reports is similar to her known history of migraines, although she had taken herself off of prior treatment several years ago.  This headache does seem consistent with migraine by her description, but especially by her report that it is the same as prior headaches.  Less likely pseudotumor for me clinically.  I will treat for migraine type headache.  I do want to refer her to PCP and neurology as an outpatient.  I am going to add decadron to perhaps help with rebound headache.  I spoke with on call Dr. Doy Mince, neurology regarding whether to start patient on antiepileptic given reported history of seizures, known history of seizures for which she previously took Dilantin, albeit last 3 years ago. Dr. Doy Mince recommended going ahead and loading and then starting the patient back on Dilantin and referred to neurology. We discussed no additional medication such as Topamax in regard to the migraines and headaches at this point in time.  Patient's headache is improved. I discussed loading for Dilantin and prescription for Dilantin. We discussed instructions for the oral load.  Urine pregnancy test negative prior to starting Dilantin. We discussed not becoming pregnant while on Dilantin.    CONSULTATIONS:   Discussion with Dr. Doy Mince, neurology.   Patient / Family / Caregiver informed of clinical course, medical decision-making process, and agree with plan.   I discussed return precautions, follow-up instructions, and discharge instructions with patient and/or family.   ___________________________________________   FINAL CLINICAL IMPRESSION(S) / ED DIAGNOSES   Final diagnoses:  Migraine with status migrainosus, not intractable, unspecified migraine type  Seizure disorder [facility])              Note: This dictation was prepared with Dragon dictation. Any transcriptional errors that result from this process are unintentional    Lisa Roca,  MD 12/09/15 1348

## 2015-12-09 NOTE — Discharge Instructions (Signed)
You were evaluated for headaches similar to prior migraines, and were treated for migraine/nonspecific headache with improvement.  Ask we discussed, he does need follow with the primary care doctor and refer for her to the [facility], and he needs to see a neurologist and I referred to Dr. Trena Platt office.  I spoke with the neurologist on-call, Dr. Doy Mince, who recommended you could be restarted after a load on your previously taken Dilantin for seizures.   Not safe in pregnancy, so protect against pregnancy.  Return to the emergency room for any worsening condition including new or worsening headache, confusion, altered mental status, dizziness passing out, weakness, numbness, fever, seizure, or any other symptoms concerning to you.

## 2015-12-09 NOTE — ED Triage Notes (Signed)
Pt presents with headache since Tuesday. Was seen here on 11/28 for same and was given naproxyn and sumatriptan, meds are not helping.

## 2015-12-11 ENCOUNTER — Encounter: Payer: Self-pay | Admitting: Emergency Medicine

## 2015-12-11 ENCOUNTER — Emergency Department
Admission: EM | Admit: 2015-12-11 | Discharge: 2015-12-12 | Disposition: A | Discharge status: Home / Self Care | Payer: Medicaid Other | Attending: Emergency Medicine | Admitting: Emergency Medicine

## 2015-12-11 DIAGNOSIS — J45909 Unspecified asthma, uncomplicated: Secondary | ICD-10-CM | POA: Insufficient documentation

## 2015-12-11 DIAGNOSIS — Z791 Long term (current) use of non-steroidal anti-inflammatories (NSAID): Secondary | ICD-10-CM | POA: Insufficient documentation

## 2015-12-11 DIAGNOSIS — Z7722 Contact with and (suspected) exposure to environmental tobacco smoke (acute) (chronic): Secondary | ICD-10-CM | POA: Diagnosis not present

## 2015-12-11 DIAGNOSIS — F909 Attention-deficit hyperactivity disorder, unspecified type: Secondary | ICD-10-CM | POA: Diagnosis not present

## 2015-12-11 DIAGNOSIS — Z91040 Latex allergy status: Secondary | ICD-10-CM | POA: Insufficient documentation

## 2015-12-11 DIAGNOSIS — Z79899 Other long term (current) drug therapy: Secondary | ICD-10-CM | POA: Diagnosis not present

## 2015-12-11 DIAGNOSIS — R112 Nausea with vomiting, unspecified: Secondary | ICD-10-CM | POA: Diagnosis not present

## 2015-12-11 LAB — CBC
HEMATOCRIT: 39.2 % (ref 35.0–47.0)
HEMOGLOBIN: 13.8 g/dL (ref 12.0–16.0)
MCH: 30.1 pg (ref 26.0–34.0)
MCHC: 35.3 g/dL (ref 32.0–36.0)
MCV: 85.4 fL (ref 80.0–100.0)
Platelets: 327 10*3/uL (ref 150–440)
RBC: 4.59 MIL/uL (ref 3.80–5.20)
RDW: 13.5 % (ref 11.5–14.5)
WBC: 7 10*3/uL (ref 3.6–11.0)

## 2015-12-11 LAB — COMPREHENSIVE METABOLIC PANEL
ALBUMIN: 4.1 g/dL (ref 3.5–5.0)
ALT: 46 U/L (ref 14–54)
AST: 43 U/L — AB (ref 15–41)
Alkaline Phosphatase: 96 U/L (ref 38–126)
Anion gap: 7 (ref 5–15)
BILIRUBIN TOTAL: 0.5 mg/dL (ref 0.3–1.2)
BUN: 12 mg/dL (ref 6–20)
CO2: 26 mmol/L (ref 22–32)
Calcium: 8.7 mg/dL — ABNORMAL LOW (ref 8.9–10.3)
Chloride: 103 mmol/L (ref 101–111)
Creatinine, Ser: 0.72 mg/dL (ref 0.44–1.00)
GFR calc Af Amer: >60 mL/min (ref >60)
GFR calc non Af Amer: >60 mL/min (ref >60)
GLUCOSE: 98 mg/dL (ref 65–99)
POTASSIUM: 3.6 mmol/L (ref 3.5–5.1)
SODIUM: 136 mmol/L (ref 135–145)
TOTAL PROTEIN: 7.4 g/dL (ref 6.5–8.1)

## 2015-12-11 LAB — URINALYSIS COMPLETE WITH MICROSCOPIC (ARMC ONLY)
BACTERIA UA: NONE SEEN
Bilirubin Urine: NEGATIVE
GLUCOSE, UA: NEGATIVE mg/dL
Hgb urine dipstick: NEGATIVE
Ketones, ur: NEGATIVE mg/dL
Leukocytes, UA: NEGATIVE
Nitrite: NEGATIVE
PROTEIN: NEGATIVE mg/dL
Specific Gravity, Urine: 1.023 (ref 1.005–1.030)
pH: 6 (ref 5.0–8.0)

## 2015-12-11 LAB — LIPASE, BLOOD: Lipase: 21 U/L (ref 11–51)

## 2015-12-11 LAB — PREGNANCY, URINE: PREG TEST UR: NEGATIVE

## 2015-12-11 MED ORDER — ONDANSETRON 4 MG PO TBDP
4.0000 mg | ORAL_TABLET | Freq: Once | ORAL | Status: AC | PRN
  Administered 2015-12-11: 4 mg via ORAL
  Filled 2015-12-11: qty 1

## 2015-12-11 NOTE — ED Notes (Signed)
Patient to ED for N/V x 1-2 days. States she just ate some [employer] fries and a sprite to help with the nausea but "it's not working."

## 2015-12-11 NOTE — ED Triage Notes (Signed)
Pt reports nausea since yesterday, started vomiting tonight; denies abd pain; admits to urinary frequency; pt arrived drinking a large soda from [employer] and admitted to eating some french fries; took Phenergan 10 minutes pta; pt awake and alert; oriented x 3;

## 2015-12-12 MED ORDER — PROMETHAZINE HCL 25 MG RE SUPP
25.0000 mg | Freq: Four times a day (QID) | RECTAL | 1 refills | Status: DC | PRN
Start: 2015-12-12 — End: 2017-11-25

## 2015-12-12 MED ORDER — PROMETHAZINE HCL 25 MG RE SUPP: 25.0000 mg | Freq: Four times a day (QID) | RECTAL | 1 refills | Status: DC | PRN

## 2015-12-12 NOTE — Discharge Instructions (Signed)
Please seek medical attention for any high fevers, chest pain, shortness of breath, change in behavior, persistent vomiting, bloody stool or any other new or concerning symptoms.  

## 2015-12-12 NOTE — ED Provider Notes (Signed)
[facility] Emergency Department Provider Note   ____________________________________________   I have reviewed the triage vital signs and the nursing notes.   HISTORY  Chief Complaint Nausea; Emesis; and Urinary Frequency   History limited by: Not Limited   HPI Latoya Stark is a 20 y.o. female who presents to the emergency department today because of concerns for nausea and vomiting. The patient states that the symptoms started this afternoon. She has had roughly 10 episodes of vomiting. She did try eating some [employer] after the vomiting started because she thought that this salt intake would help. It did not help. Additionally she tried taking some Phenergan. She also tried taking some Zofran that her fianc was prescribed recently for a stomach virus. She states that none of these medications have helped. She denies any associated diarrhea. No abdominal pain. No fevers.   Past Medical History:  Diagnosis Date  . ADHD (attention deficit hyperactivity disorder)   . Anemia   . Anxiety   . Asthma   . Depression   . Headache   . Seizures ([place])    "when I get too hot"  . Trichomonas infection   . UTI (lower urinary tract infection)     Patient Active Problem List   Diagnosis Date Noted  . Constipation 02/24/2014  . CN (constipation) 12/23/2013  . Gonorrhea in female 10/05/2013  . BV (bacterial vaginosis) 09/22/2013  . Candidiasis of vulva and vagina 09/22/2013    Past Surgical History:  Procedure Laterality Date  . ADENOIDECTOMY    . DILATION AND EVACUATION N/A 08/09/2013   Procedure: DILATATION AND EVACUATION;  Surgeon: Frederico Hamman, MD;  Location: [facility];  Service: Gynecology;  Laterality: N/A;  . ORTHOPEDIC SURGERY Left    left hand  . TONSILLECTOMY      Prior to Admission medications   Medication Sig Start Date End Date Taking? Authorizing Provider  acetaminophen (TYLENOL) 500 MG tablet Take 1,000 mg by mouth every 6 (six)  hours as needed for headache.   Yes Historical Provider, MD  ibuprofen (ADVIL,MOTRIN) 800 MG tablet Take 1 tablet (800 mg total) by mouth every 6 (six) hours as needed. 11/01/15  Yes Drenda Freeze, MD  naproxen (NAPROSYN) 500 MG tablet Take 1 tablet (500 mg total) by mouth 2 (two) times daily with a meal. 12/07/15 12/06/16 Yes Laban Emperor, PA-C  Norethindrone Acetate-Ethinyl Estrad-FE (LOESTRIN 24 FE) 1-20 MG-MCG(24) tablet Take 1 tablet by mouth daily. 08/04/15  Yes Shelly Bombard, MD  phenytoin (DILANTIN) 100 MG ER capsule Take 1 capsule (100 mg total) by mouth 3 (three) times daily. 12/09/15  Yes Lisa Roca, MD  phenytoin (DILANTIN) 100 MG ER capsule Take 300mg  (3 tablets) at 5pm 12/09/15 - today Take 300mg  (3 tablets) at 8pm 12/09/15 - today 12/09/15 12/11/15 Yes Lisa Roca, MD  Prenat-FeCbn-FeAspGl-FA-Omega (OB COMPLETE PETITE) 35-5-1-200 MG CAPS Take 1 capsule by mouth daily before breakfast. 01/29/15  Yes Shelly Bombard, MD  promethazine (PHENERGAN) 6.25 MG/5ML syrup Take 20 mLs (25 mg total) by mouth 4 (four) times daily as needed for nausea or vomiting. 11/01/15 10/31/16 Yes Drenda Freeze, MD  SUMAtriptan (IMITREX) 50 MG tablet Take 1 tablet (50 mg total) by mouth once as needed for migraine. May repeat in 2 hours if headache persists or recurs. 12/07/15 12/12/15 Yes Laban Emperor, PA-C    Allergies Milk-related compounds; Mushroom extract complex; Penicillins; Cheese; Latex; and Sulfa antibiotics  Family History  Problem Relation Age of Onset  .  Seizures Mother   . Diabetes Maternal Grandmother   . Colon cancer Maternal Grandmother   . Hypertension Maternal Grandmother   . Clotting disorder Maternal Grandmother     Social History Social History  Substance Use Topics  . Smoking status: Passive Smoke Exposure - Never Smoker  . Smokeless tobacco: Never Used  . Alcohol use No    Review of Systems  Constitutional: Negative for fever. Cardiovascular: Negative for  chest pain. Respiratory: Negative for shortness of breath. Gastrointestinal: Negative for abdominal pain. Positive for nausea and vomiting. Negative for diarrhea. Neurological: Negative for headaches, focal weakness or numbness.  10-point ROS otherwise negative.  ____________________________________________   PHYSICAL EXAM:  VITAL SIGNS: ED Triage Vitals [12/11/15 2231]  Enc Vitals Group     BP 129/66     Pulse Rate (!) 105     Resp 18     Temp 98.1 F (36.7 C)     Temp Source Oral     SpO2 97 %     Weight 220 lb (99.8 kg)     Height 5\' 3"  (1.6 m)     Head Circumference      Peak Flow      Pain Score 0   Constitutional: Alert and oriented. Well appearing and in no distress. Eyes: Conjunctivae are normal. Normal extraocular movements. ENT   Head: Normocephalic and atraumatic.   Nose: No congestion/rhinnorhea.   Mouth/Throat: Mucous membranes are moist.   Neck: No stridor. Hematological/Lymphatic/Immunilogical: No cervical lymphadenopathy. Cardiovascular: Normal rate, regular rhythm.  No murmurs, rubs, or gallops.  Respiratory: Normal respiratory effort without tachypnea nor retractions. Breath sounds are clear and equal bilaterally. No wheezes/rales/rhonchi. Gastrointestinal: Soft and minimally tender in the epigastric region. Genitourinary: Deferred Musculoskeletal: Normal range of motion in all extremities. No lower extremity edema. Neurologic:  Normal speech and language. No gross focal neurologic deficits are appreciated.  Skin:  Skin is warm, dry and intact. No rash noted. Psychiatric: Mood and affect are normal. Speech and behavior are normal. Patient exhibits appropriate insight and judgment.  ____________________________________________    LABS (pertinent positives/negatives)  Labs Reviewed  COMPREHENSIVE METABOLIC PANEL - Abnormal; Notable for the following:       Result Value   Calcium 8.7 (*)    AST 43 (*)    All other components within  normal limits  URINALYSIS COMPLETEWITH MICROSCOPIC ([facility] ONLY) - Abnormal; Notable for the following:    Color, Urine YELLOW (*)    APPearance CLEAR (*)    Squamous Epithelial / LPF 0-5 (*)    All other components within normal limits  LIPASE, BLOOD  CBC  PREGNANCY, URINE     ____________________________________________   EKG  None  ____________________________________________    RADIOLOGY  None  ____________________________________________   PROCEDURES  Procedures  ____________________________________________   INITIAL IMPRESSION / ASSESSMENT AND PLAN / ED COURSE  Pertinent labs & imaging results that were available during my care of the patient were reviewed by me and considered in my medical decision making (see chart for details).  Patient presented to the emergency department today because of concerns for nausea and vomiting. On exam patient without any vomiting. Patient's blood work without any concerning symptoms. No leukocytosis. Think likely patient suffering from a viral infection or food poisoning. She did have sick contact with her fianc. Will discharge home with Phenergan suppositories in case patient is having a hard time keeping down due to nausea.    ____________________________________________   FINAL CLINICAL IMPRESSION(S) / ED DIAGNOSES  Final diagnoses:  Nausea and vomiting, intractability of vomiting not specified, unspecified vomiting type     Note: This dictation was prepared with Dragon dictation. Any transcriptional errors that result from this process are unintentional    Nance Pear, MD 12/12/15 [phone]

## 2015-12-16 ENCOUNTER — Encounter (HOSPITAL_COMMUNITY): Payer: Self-pay | Admitting: Emergency Medicine

## 2015-12-16 ENCOUNTER — Emergency Department (HOSPITAL_COMMUNITY)
Admission: EM | Admit: 2015-12-16 | Discharge: 2015-12-16 | Disposition: A | Discharge status: Home / Self Care | Payer: Medicaid Other | Attending: Emergency Medicine | Admitting: Emergency Medicine

## 2015-12-16 ENCOUNTER — Emergency Department (HOSPITAL_COMMUNITY): Payer: Medicaid Other

## 2015-12-16 DIAGNOSIS — Y990 Civilian activity done for income or pay: Secondary | ICD-10-CM | POA: Insufficient documentation

## 2015-12-16 DIAGNOSIS — N3090 Cystitis, unspecified without hematuria: Secondary | ICD-10-CM | POA: Insufficient documentation

## 2015-12-16 DIAGNOSIS — R569 Unspecified convulsions: Secondary | ICD-10-CM

## 2015-12-16 DIAGNOSIS — J45909 Unspecified asthma, uncomplicated: Secondary | ICD-10-CM | POA: Insufficient documentation

## 2015-12-16 DIAGNOSIS — Z7722 Contact with and (suspected) exposure to environmental tobacco smoke (acute) (chronic): Secondary | ICD-10-CM | POA: Diagnosis not present

## 2015-12-16 DIAGNOSIS — Y929 Unspecified place or not applicable: Secondary | ICD-10-CM | POA: Insufficient documentation

## 2015-12-16 DIAGNOSIS — F909 Attention-deficit hyperactivity disorder, unspecified type: Secondary | ICD-10-CM | POA: Insufficient documentation

## 2015-12-16 DIAGNOSIS — W19XXXA Unspecified fall, initial encounter: Secondary | ICD-10-CM | POA: Insufficient documentation

## 2015-12-16 DIAGNOSIS — M542 Cervicalgia: Secondary | ICD-10-CM | POA: Insufficient documentation

## 2015-12-16 DIAGNOSIS — N3000 Acute cystitis without hematuria: Secondary | ICD-10-CM

## 2015-12-16 DIAGNOSIS — G40909 Epilepsy, unspecified, not intractable, without status epilepticus: Secondary | ICD-10-CM | POA: Diagnosis present

## 2015-12-16 DIAGNOSIS — Y939 Activity, unspecified: Secondary | ICD-10-CM | POA: Insufficient documentation

## 2015-12-16 LAB — CBC WITH DIFFERENTIAL/PLATELET
BASOS ABS: 0 10*3/uL (ref 0.0–0.1)
BASOS PCT: 0 %
Eosinophils Absolute: 0.1 10*3/uL (ref 0.0–0.7)
Eosinophils Relative: 2 %
HEMATOCRIT: 37.6 % (ref 36.0–46.0)
HEMOGLOBIN: 12.8 g/dL (ref 12.0–15.0)
Lymphocytes Relative: 33 %
Lymphs Abs: 1.6 10*3/uL (ref 0.7–4.0)
MCH: 29 pg (ref 26.0–34.0)
MCHC: 34 g/dL (ref 30.0–36.0)
MCV: 85.1 fL (ref 78.0–100.0)
MONO ABS: 0.3 10*3/uL (ref 0.1–1.0)
Monocytes Relative: 7 %
NEUTROS ABS: 2.8 10*3/uL (ref 1.7–7.7)
NEUTROS PCT: 58 %
Platelets: 338 10*3/uL (ref 150–400)
RBC: 4.42 MIL/uL (ref 3.87–5.11)
RDW: 12.8 % (ref 11.5–15.5)
WBC: 4.8 10*3/uL (ref 4.0–10.5)

## 2015-12-16 LAB — URINALYSIS, ROUTINE W REFLEX MICROSCOPIC
Bilirubin Urine: NEGATIVE
GLUCOSE, UA: NEGATIVE mg/dL
HGB URINE DIPSTICK: NEGATIVE
KETONES UR: NEGATIVE mg/dL
Leukocytes, UA: NEGATIVE
NITRITE: POSITIVE — AB
PROTEIN: NEGATIVE mg/dL
Specific Gravity, Urine: 1.019 (ref 1.005–1.030)
pH: 6 (ref 5.0–8.0)

## 2015-12-16 LAB — PHENYTOIN LEVEL, TOTAL: Phenytoin Lvl: <2.5 ug/mL — ABNORMAL LOW (ref 10.0–20.0)

## 2015-12-16 LAB — COMPREHENSIVE METABOLIC PANEL
ALT: 30 U/L (ref 14–54)
AST: 23 U/L (ref 15–41)
Albumin: 4.4 g/dL (ref 3.5–5.0)
Alkaline Phosphatase: 94 U/L (ref 38–126)
Anion gap: 6 (ref 5–15)
BUN: 11 mg/dL (ref 6–20)
CHLORIDE: 104 mmol/L (ref 101–111)
CO2: 27 mmol/L (ref 22–32)
CREATININE: 0.56 mg/dL (ref 0.44–1.00)
Calcium: 9.1 mg/dL (ref 8.9–10.3)
Glucose, Bld: 96 mg/dL (ref 65–99)
Potassium: 3.9 mmol/L (ref 3.5–5.1)
Sodium: 137 mmol/L (ref 135–145)
TOTAL PROTEIN: 7.6 g/dL (ref 6.5–8.1)
Total Bilirubin: 0.4 mg/dL (ref 0.3–1.2)

## 2015-12-16 LAB — CBG MONITORING, ED: GLUCOSE-CAPILLARY: 103 mg/dL — AB (ref 65–99)

## 2015-12-16 MED ORDER — CEPHALEXIN 500 MG PO CAPS: 500.0000 mg | ORAL_CAPSULE | Freq: Three times a day (TID) | ORAL | 0 refills | Status: DC

## 2015-12-16 MED ORDER — NAPROXEN 250 MG PO TABS: 250.0000 mg | ORAL_TABLET | Freq: Two times a day (BID) | ORAL | 0 refills | Status: DC

## 2015-12-16 MED ORDER — ACETAMINOPHEN 325 MG PO TABS
650.0000 mg | ORAL_TABLET | Freq: Once | ORAL | Status: AC
  Administered 2015-12-16: 650 mg via ORAL
  Filled 2015-12-16: qty 2

## 2015-12-16 MED ORDER — SODIUM CHLORIDE 0.9 % IV SOLN
1000.0000 mg | Freq: Once | INTRAVENOUS | Status: AC
  Administered 2015-12-16: 1000 mg via INTRAVENOUS
  Filled 2015-12-16: qty 20

## 2015-12-16 MED ORDER — PHENYTOIN SODIUM EXTENDED 100 MG PO CAPS: 100.0000 mg | ORAL_CAPSULE | Freq: Three times a day (TID) | ORAL | 0 refills | Status: DC

## 2015-12-16 MED ORDER — IBUPROFEN 800 MG PO TABS
ORAL_TABLET | ORAL | Status: AC
  Filled 2015-12-16: qty 1

## 2015-12-16 NOTE — ED Provider Notes (Signed)
[facility] DEPT Provider Note   CSN: [id number] Arrival date & time: 12/16/15  0908     History   Chief Complaint Chief Complaint  Patient presents with  . Seizures    HPI MIREYAH ALBERTINI is a 20 y.o. female.  SAARAH HARDY is a 20 y.o. Female with history of seizures who presents to the emergency department after a seizure at her work today. Patient reports she woke up on the floor. She was in a standing position. She does not remember the seizure. EMS reports she had an unwitnessed fall from a standing position with seizure-like activity. Patient was postictal with EMS and required no treatment. Patient reports she has history of seizures and takes Dilantin. She believes she missed her dose of Dilantin last night. She did take her dose this morning. She takes Dilantin 3 times a day. Patient complains of pain to her right neck and to her head. She reports some blurry vision but she tells me she wears glasses. No double vision. Patient reports she was recently sick with a GI bug with nausea, vomiting and diarrhea. She's had no nausea or vomiting or diarrhea today. She denies fevers, abdominal pain, cough, shortness of breath, chest pain, abdominal pain, nausea, vomiting, urinary symptoms, rashes, numbness, tingling or weakness.   The history is provided by the patient and medical records. No language interpreter was used.  Seizures   Associated symptoms include headaches. Pertinent negatives include no visual disturbance, no sore throat, no chest pain, no cough, no nausea, no vomiting and no diarrhea.    Past Medical History:  Diagnosis Date  . ADHD (attention deficit hyperactivity disorder)   . Anemia   . Anxiety   . Asthma   . Depression   . Headache   . Seizures ([place])    "when I get too hot"  . Trichomonas infection   . UTI (lower urinary tract infection)     Patient Active Problem List   Diagnosis Date Noted  . Constipation 02/24/2014  . CN (constipation)  12/23/2013  . Gonorrhea in female 10/05/2013  . BV (bacterial vaginosis) 09/22/2013  . Candidiasis of vulva and vagina 09/22/2013    Past Surgical History:  Procedure Laterality Date  . ADENOIDECTOMY    . DILATION AND EVACUATION N/A 08/09/2013   Procedure: DILATATION AND EVACUATION;  Surgeon: Frederico Hamman, MD;  Location: [facility];  Service: Gynecology;  Laterality: N/A;  . ORTHOPEDIC SURGERY Left    left hand  . TONSILLECTOMY      OB History    Gravida Para Term Preterm AB Living   2       2     SAB TAB Ectopic Multiple Live Births   2               Home Medications    Prior to Admission medications   Medication Sig Start Date End Date Taking? Authorizing Provider  acetaminophen (TYLENOL) 500 MG tablet Take 1,000 mg by mouth every 6 (six) hours as needed for headache.   Yes Historical Provider, MD  albuterol (PROVENTIL HFA;VENTOLIN HFA) 108 (90 Base) MCG/ACT inhaler Inhale 2 puffs into the lungs every 6 (six) hours as needed for wheezing or shortness of breath.   Yes Historical Provider, MD  ibuprofen (ADVIL,MOTRIN) 800 MG tablet Take 1 tablet (800 mg total) by mouth every 6 (six) hours as needed. Patient taking differently: Take 800 mg by mouth every 6 (six) hours as needed for moderate pain.  11/01/15  Yes Drenda Freeze, MD  Norethindrone Acetate-Ethinyl Estrad-FE (LOESTRIN 24 FE) 1-20 MG-MCG(24) tablet Take 1 tablet by mouth daily. Patient taking differently: Take 1 tablet by mouth every evening.  08/04/15  Yes Shelly Bombard, MD  Prenat-FeCbn-FeAspGl-FA-Omega (OB COMPLETE PETITE) 35-5-1-200 MG CAPS Take 1 capsule by mouth daily before breakfast. 01/29/15  Yes Shelly Bombard, MD  promethazine (PHENERGAN) 25 MG suppository Place 1 suppository (25 mg total) rectally every 6 (six) hours as needed for nausea. 12/12/15 12/11/16 Yes Nance Pear, MD  promethazine (PHENERGAN) 6.25 MG/5ML syrup Take 20 mLs (25 mg total) by mouth 4 (four) times daily as needed for nausea or  vomiting. 11/01/15 10/31/16 Yes Drenda Freeze, MD  SUMAtriptan (IMITREX) 50 MG tablet Take 1 tablet (50 mg total) by mouth once as needed for migraine. May repeat in 2 hours if headache persists or recurs. 12/07/15 12/16/15 Yes Laban Emperor, PA-C  naproxen (NAPROSYN) 250 MG tablet Take 1 tablet (250 mg total) by mouth 2 (two) times daily with a meal. 12/16/15   Waynetta Pean, PA-C  phenytoin (DILANTIN) 100 MG ER capsule Take 1 capsule (100 mg total) by mouth 3 (three) times daily. 12/16/15   Waynetta Pean, PA-C    Family History Family History  Problem Relation Age of Onset  . Seizures Mother   . Diabetes Maternal Grandmother   . Colon cancer Maternal Grandmother   . Hypertension Maternal Grandmother   . Clotting disorder Maternal Grandmother     Social History Social History  Substance Use Topics  . Smoking status: Passive Smoke Exposure - Never Smoker  . Smokeless tobacco: Never Used  . Alcohol use No     Allergies   Milk-related compounds; Mushroom extract complex; Penicillins; Cheese; Latex; and Sulfa antibiotics   Review of Systems Review of Systems  Constitutional: Negative for chills and fever.  HENT: Negative for congestion and sore throat.   Eyes: Negative for visual disturbance.  Respiratory: Negative for cough and shortness of breath.   Cardiovascular: Negative for chest pain.  Gastrointestinal: Negative for abdominal pain, diarrhea, nausea and vomiting.  Genitourinary: Negative for dysuria.  Musculoskeletal: Positive for back pain and neck pain.  Skin: Negative for rash and wound.  Neurological: Positive for seizures and headaches. Negative for dizziness, weakness, light-headedness and numbness.     Physical Exam Updated Vital Signs BP 114/76   Pulse 69   Temp 98.7 F (37.1 C) (Oral)   Resp 17   Ht 5\' 3"  (1.6 m)   Wt 99.8 kg   LMP 11/06/2015 (Approximate)   SpO2 99%   BMI 38.97 kg/m   Physical Exam  Constitutional: She is oriented to person,  place, and time. She appears well-developed and well-nourished. No distress.  Nontoxic appearing. Morbidly obese female.  HENT:  Head: Normocephalic and atraumatic.  Right Ear: External ear normal.  Left Ear: External ear normal.  Mouth/Throat: Oropharynx is clear and moist.  No visible or palpated signs of head injury.  Eyes: Conjunctivae and EOM are normal. Pupils are equal, round, and reactive to light. Right eye exhibits no discharge. Left eye exhibits no discharge.  Neck: Normal range of motion. Neck supple. No JVD present. No tracheal deviation present.  Tenderness to the right lateral aspect of her neck. No midline neck tenderness. No crepitus or deformity.  Cardiovascular: Normal rate, regular rhythm, normal heart sounds and intact distal pulses.  Exam reveals no gallop and no friction rub.   No murmur heard. Pulmonary/Chest: Effort normal and breath sounds  normal. No stridor. No respiratory distress. She has no wheezes. She has no rales. She exhibits no tenderness.  Lungs clear to auscultation bilaterally. No chest wall tenderness to palpation.  Abdominal: Soft. There is no tenderness. There is no guarding.  Musculoskeletal: Normal range of motion. She exhibits no edema or tenderness.  No midline neck or back tenderness. Mild tenderness to her right lateral neck musculature. Patient is spontaneously moving all extremities in a coordinated fashion exhibiting good strength.   Lymphadenopathy:    She has no cervical adenopathy.  Neurological: She is alert and oriented to person, place, and time. No cranial nerve deficit or sensory deficit. Coordination normal.  Patient is alert and oriented 3. Cranial nerves are intact. Speech is clear and coherent. Sensation is intact in her bilateral upper and lower extremities.  Skin: Skin is warm and dry. Capillary refill takes less than 2 seconds. No rash noted. She is not diaphoretic. No erythema. No pallor.  Psychiatric: She has a normal mood and  affect. Her behavior is normal.  Nursing note and vitals reviewed.    ED Treatments / Results  Labs (all labs ordered are listed, but only abnormal results are displayed) Labs Reviewed  PHENYTOIN LEVEL, TOTAL - Abnormal; Notable for the following:       Result Value   Phenytoin Lvl <2.5 (*)    All other components within normal limits  URINALYSIS, ROUTINE W REFLEX MICROSCOPIC - Abnormal; Notable for the following:    APPearance HAZY (*)    Nitrite POSITIVE (*)    Bacteria, UA MANY (*)    Squamous Epithelial / LPF 0-5 (*)    All other components within normal limits  CBG MONITORING, ED - Abnormal; Notable for the following:    Glucose-Capillary 103 (*)    All other components within normal limits  CBC WITH DIFFERENTIAL/PLATELET  COMPREHENSIVE METABOLIC PANEL    EKG  EKG Interpretation None       Radiology Ct Head Wo Contrast  Result Date: 12/16/2015 COMPARISON:  June 18, 2015 FINDINGS: CT HEAD FINDINGS Brain: No evidence of acute infarction, hemorrhage, hydrocephalus, extra-axial collection or mass lesion/mass effect. Vascular: No hyperdense vessel or unexpected calcification. Skull: Normal. Negative for fracture or focal lesion. Sinuses/Orbits: No acute finding. Other: None. CT CERVICAL SPINE FINDINGS Alignment: Normal. Skull base and vertebrae: No acute fracture. No primary bone lesion or focal pathologic process. Soft tissues and spinal canal: No prevertebral fluid or swelling. No visible canal hematoma. Disc levels:  No significant degenerative changes. Upper chest: Negative. Other: No other abnormalities. IMPRESSION: 1. No acute intracranial abnormality. 2. No fracture or traumatic malalignment in the cervical spine. CLINICAL DATA:  Seizure with fall and pain. EXAM: CT HEAD WITHOUT CONTRAST CT CERVICAL SPINE WITHOUT CONTRAST TECHNIQUE: Multidetector CT imaging of the head and cervical spine was performed following the standard protocol without intravenous contrast. Multiplanar  CT image reconstructions of the cervical spine were also generated. Electronically Signed   By: Dorise Bullion III M.D   On: 12/16/2015 12:43   Ct Cervical Spine Wo Contrast  Result Date: 12/16/2015 COMPARISON:  June 18, 2015 FINDINGS: CT HEAD FINDINGS Brain: No evidence of acute infarction, hemorrhage, hydrocephalus, extra-axial collection or mass lesion/mass effect. Vascular: No hyperdense vessel or unexpected calcification. Skull: Normal. Negative for fracture or focal lesion. Sinuses/Orbits: No acute finding. Other: None. CT CERVICAL SPINE FINDINGS Alignment: Normal. Skull base and vertebrae: No acute fracture. No primary bone lesion or focal pathologic process. Soft tissues and spinal canal: No prevertebral  fluid or swelling. No visible canal hematoma. Disc levels:  No significant degenerative changes. Upper chest: Negative. Other: No other abnormalities. IMPRESSION: 1. No acute intracranial abnormality. 2. No fracture or traumatic malalignment in the cervical spine. CLINICAL DATA:  Seizure with fall and pain. EXAM: CT HEAD WITHOUT CONTRAST CT CERVICAL SPINE WITHOUT CONTRAST TECHNIQUE: Multidetector CT imaging of the head and cervical spine was performed following the standard protocol without intravenous contrast. Multiplanar CT image reconstructions of the cervical spine were also generated. Electronically Signed   By: Dorise Bullion III M.D   On: 12/16/2015 12:43    Procedures Procedures (including critical care time)  Medications Ordered in ED Medications  ibuprofen (ADVIL,MOTRIN) 800 MG tablet (not administered)  acetaminophen (TYLENOL) tablet 650 mg (650 mg Oral Given 12/16/15 1119)  phenytoin (DILANTIN) 1,000 mg in sodium chloride 0.9 % 250 mL IVPB (1,000 mg Intravenous New Bag/Given 12/16/15 1429)     Initial Impression / Assessment and Plan / ED Course  I have reviewed the triage vital signs and the nursing notes.  Pertinent labs & imaging results that were available during my care  of the patient were reviewed by me and considered in my medical decision making (see chart for details).  Clinical Course    This  is a 20 y.o. Female with history of seizures who presents to the emergency department after a seizure at her work today. Patient reports she woke up on the floor. She was in a standing position. She does not remember the seizure. EMS reports she had an unwitnessed fall from a standing position with seizure-like activity. Patient was postictal with EMS and required no treatment. Patient reports she has history of seizures and takes Dilantin. She believes she missed her dose of Dilantin last night. She did take her dose this morning. She takes Dilantin 3 times a day. Patient complains of pain to her right neck and to her head. She reports some blurry vision but she tells me she wears glasses. No double vision. Patient reports she was recently sick with a GI bug with nausea, vomiting and diarrhea. She's had no nausea or vomiting or diarrhea today.  Later, the patient does admit to me that she is unsure the last time she took her Dilantin. She's not been refilling her prescriptions. She does have an appointment with primary care next week. On exam the patient is afebrile and nontoxic appearing. She is no focal neurological deficits. She is no palpated or visible signs of head or neck trauma. She has some tenderness to her right lateral neck. Speech is clear and coherent. CT head and neck are unremarkable. CBC and CMP are unremarkable. Urinalysis is nitrite positive with no leukocytes and many bacteria. Will treat with Keflex for urinary tract infection. Patient does report some urinary frequency and urgency recently. Phenytoin level was undetectable. As patient reports she has not been taking her medications this is not surprising. Will load with a gram of phenytoin in the emergency department. Patient has had no further seizure activity in the emergency department. Her father is  at bedside and reports that he will take her immediately to the pharmacy at discharge to pick up her prescription. I provided her a refill for her Dilantin and encouraged her to keep her appointment for follow-up with her primary care doctor for next week. I discussed the dangers of not taking her seizure medication. I discussed return precautions. I advised the patient to follow-up with their primary care provider this week. I  advised the patient to return to the emergency department with new or worsening symptoms or new concerns. The patient verbalized understanding and agreement with plan.    This patient was discussed with Dr. Eulis Foster who agrees with assessment and plan.   Final Clinical Impressions(s) / ED Diagnoses   Final diagnoses:  Seizures ([place])  Neck pain on right side  Fall, initial encounter    New Prescriptions New Prescriptions   NAPROXEN (NAPROSYN) 250 MG TABLET    Take 1 tablet (250 mg total) by mouth 2 (two) times daily with a meal.     Waynetta Pean, PA-C 12/16/15 [place], MD 12/16/15 [phone]

## 2015-12-16 NOTE — ED Notes (Signed)
ED Provider at bedside. 

## 2015-12-16 NOTE — ED Triage Notes (Signed)
Pt with [facility] for seizure at work. Unwitnessed, fell from standing position. Reports neck pain. Towel roll in place. Pt lethargic but able to answer questions appropriately.   Vitals Stable  CBG 115 116/84 BP 72 HR NSR 98% RA

## 2015-12-16 NOTE — ED Notes (Signed)
Bed: [id number] Expected date:  Expected time:  Means of arrival:  Comments: EMS- F, seizure

## 2015-12-16 NOTE — ED Notes (Signed)
Patient transported to CT 

## 2016-01-20 ENCOUNTER — Emergency Department
Admission: EM | Admit: 2016-01-20 | Discharge: 2016-01-21 | Disposition: A | Discharge status: Home / Self Care | Payer: Medicaid Other | Attending: Emergency Medicine | Admitting: Emergency Medicine

## 2016-01-20 ENCOUNTER — Encounter: Payer: Self-pay | Admitting: Emergency Medicine

## 2016-01-20 DIAGNOSIS — J111 Influenza due to unidentified influenza virus with other respiratory manifestations: Secondary | ICD-10-CM | POA: Insufficient documentation

## 2016-01-20 DIAGNOSIS — R51 Headache: Secondary | ICD-10-CM | POA: Diagnosis present

## 2016-01-20 DIAGNOSIS — Z7722 Contact with and (suspected) exposure to environmental tobacco smoke (acute) (chronic): Secondary | ICD-10-CM | POA: Diagnosis not present

## 2016-01-20 DIAGNOSIS — Z79899 Other long term (current) drug therapy: Secondary | ICD-10-CM | POA: Diagnosis not present

## 2016-01-20 DIAGNOSIS — G40909 Epilepsy, unspecified, not intractable, without status epilepticus: Secondary | ICD-10-CM | POA: Insufficient documentation

## 2016-01-20 DIAGNOSIS — Z91040 Latex allergy status: Secondary | ICD-10-CM | POA: Insufficient documentation

## 2016-01-20 DIAGNOSIS — F909 Attention-deficit hyperactivity disorder, unspecified type: Secondary | ICD-10-CM | POA: Diagnosis not present

## 2016-01-20 DIAGNOSIS — J45909 Unspecified asthma, uncomplicated: Secondary | ICD-10-CM | POA: Insufficient documentation

## 2016-01-20 LAB — URINALYSIS, COMPLETE (UACMP) WITH MICROSCOPIC
Bilirubin Urine: NEGATIVE
GLUCOSE, UA: NEGATIVE mg/dL
Hgb urine dipstick: NEGATIVE
Ketones, ur: NEGATIVE mg/dL
Leukocytes, UA: NEGATIVE
Nitrite: POSITIVE — AB
PROTEIN: NEGATIVE mg/dL
Specific Gravity, Urine: 1.015 (ref 1.005–1.030)
pH: 7 (ref 5.0–8.0)

## 2016-01-20 LAB — COMPREHENSIVE METABOLIC PANEL
ALBUMIN: 4.3 g/dL (ref 3.5–5.0)
ALK PHOS: 98 U/L (ref 38–126)
ALT: 32 U/L (ref 14–54)
ANION GAP: 8 (ref 5–15)
AST: 40 U/L (ref 15–41)
BUN: 8 mg/dL (ref 6–20)
CALCIUM: 8.9 mg/dL (ref 8.9–10.3)
CO2: 24 mmol/L (ref 22–32)
Chloride: 104 mmol/L (ref 101–111)
Creatinine, Ser: 0.73 mg/dL (ref 0.44–1.00)
GFR calc Af Amer: >60 mL/min (ref >60)
GFR calc non Af Amer: >60 mL/min (ref >60)
GLUCOSE: 128 mg/dL — AB (ref 65–99)
Potassium: 3.4 mmol/L — ABNORMAL LOW (ref 3.5–5.1)
SODIUM: 136 mmol/L (ref 135–145)
Total Bilirubin: 0.5 mg/dL (ref 0.3–1.2)
Total Protein: 8.1 g/dL (ref 6.5–8.1)

## 2016-01-20 LAB — CBC
HCT: 38.3 % (ref 35.0–47.0)
HEMOGLOBIN: 13 g/dL (ref 12.0–16.0)
MCH: 29.3 pg (ref 26.0–34.0)
MCHC: 34.1 g/dL (ref 32.0–36.0)
MCV: 86 fL (ref 80.0–100.0)
Platelets: 318 10*3/uL (ref 150–440)
RBC: 4.45 MIL/uL (ref 3.80–5.20)
RDW: 14.1 % (ref 11.5–14.5)
WBC: 6.2 10*3/uL (ref 3.6–11.0)

## 2016-01-20 LAB — INFLUENZA PANEL BY PCR (TYPE A & B)
INFLAPCR: POSITIVE — AB
INFLBPCR: NEGATIVE

## 2016-01-20 LAB — PHENYTOIN LEVEL, TOTAL

## 2016-01-20 LAB — POCT PREGNANCY, URINE: Preg Test, Ur: NEGATIVE

## 2016-01-20 LAB — LIPASE, BLOOD: Lipase: 22 U/L (ref 11–51)

## 2016-01-20 MED ORDER — ACETAMINOPHEN 325 MG PO TABS
650.0000 mg | ORAL_TABLET | Freq: Once | ORAL | Status: AC
  Administered 2016-01-20: 650 mg via ORAL

## 2016-01-20 MED ORDER — ONDANSETRON 4 MG PO TBDP
4.0000 mg | ORAL_TABLET | Freq: Once | ORAL | Status: AC | PRN
  Administered 2016-01-20: 4 mg via ORAL
  Filled 2016-01-20: qty 1

## 2016-01-20 MED ORDER — SODIUM CHLORIDE 0.9 % IV SOLN
1000.0000 mg | Freq: Once | INTRAVENOUS | Status: AC
  Administered 2016-01-20: 1000 mg via INTRAVENOUS
  Filled 2016-01-20: qty 20

## 2016-01-20 MED ORDER — LORAZEPAM 2 MG/ML IJ SOLN
INTRAMUSCULAR | Status: AC
  Administered 2016-01-20: 1 mg via INTRAVENOUS
  Filled 2016-01-20: qty 1

## 2016-01-20 MED ORDER — IBUPROFEN 800 MG PO TABS
ORAL_TABLET | ORAL | Status: AC
  Administered 2016-01-20: 800 mg via ORAL
  Filled 2016-01-20: qty 1

## 2016-01-20 MED ORDER — OSELTAMIVIR PHOSPHATE 75 MG PO CAPS
75.0000 mg | ORAL_CAPSULE | Freq: Once | ORAL | Status: AC
  Administered 2016-01-21: 75 mg via ORAL
  Filled 2016-01-20: qty 1

## 2016-01-20 MED ORDER — OSELTAMIVIR PHOSPHATE 75 MG PO CAPS: 75.0000 mg | ORAL_CAPSULE | Freq: Two times a day (BID) | ORAL | 0 refills | Status: AC

## 2016-01-20 MED ORDER — SODIUM CHLORIDE 0.9 % IV SOLN
1000.0000 mg | Freq: Once | INTRAVENOUS | Status: DC
  Filled 2016-01-20: qty 20

## 2016-01-20 MED ORDER — PHENYTOIN SODIUM EXTENDED 100 MG PO CAPS: 100.0000 mg | ORAL_CAPSULE | Freq: Three times a day (TID) | ORAL | 2 refills | Status: DC

## 2016-01-20 MED ORDER — ACETAMINOPHEN 325 MG PO TABS
ORAL_TABLET | ORAL | Status: AC
  Administered 2016-01-20: 650 mg via ORAL
  Filled 2016-01-20: qty 2

## 2016-01-20 MED ORDER — IBUPROFEN 800 MG PO TABS
800.0000 mg | ORAL_TABLET | Freq: Once | ORAL | Status: AC
  Administered 2016-01-20: 800 mg via ORAL

## 2016-01-20 MED ORDER — LORAZEPAM 2 MG/ML IJ SOLN
1.0000 mg | Freq: Once | INTRAMUSCULAR | Status: AC
  Administered 2016-01-20: 1 mg via INTRAVENOUS

## 2016-01-20 NOTE — ED Notes (Signed)
Patient is sleeping in [place] in recliner. NAD.

## 2016-01-20 NOTE — ED Triage Notes (Signed)
Patient to ER for c/o severe headache. Patient states she has h/o seizures, has seizures when headaches occur. Also states she has not had Dilantin dose in last few days. +Cough.

## 2016-01-20 NOTE — ED Provider Notes (Signed)
[facility] Emergency Department Provider Note    First MD Initiated Contact with Patient 01/20/16 2330     (approximate)  I have reviewed the triage vital signs and the nursing notes.   HISTORY  Chief Complaint Headache    HPI Latoya Stark is a 21 y.o. female with history of seizure disorder presents to the emergency department 1 day history of generalized muscle aches congestion headache cough and subjective fevers. In addition the patient admits to being noncompliant with Dilantin secondary to not having the tablets for greater than 5 days. Patient states regarding her headache her current pain score is 4 out of 10. Patient denies any weakness numbness gait instability or visual changes.   Past Medical History:  Diagnosis Date  . ADHD (attention deficit hyperactivity disorder)   . Anemia   . Anxiety   . Asthma   . Depression   . Headache   . Seizures ([place])    "when I get too hot"  . Trichomonas infection   . UTI (lower urinary tract infection)     Patient Active Problem List   Diagnosis Date Noted  . Constipation 02/24/2014  . CN (constipation) 12/23/2013  . Gonorrhea in female 10/05/2013  . BV (bacterial vaginosis) 09/22/2013  . Candidiasis of vulva and vagina 09/22/2013    Past Surgical History:  Procedure Laterality Date  . ADENOIDECTOMY    . DILATION AND EVACUATION N/A 08/09/2013   Procedure: DILATATION AND EVACUATION;  Surgeon: Frederico Hamman, MD;  Location: [facility];  Service: Gynecology;  Laterality: N/A;  . ORTHOPEDIC SURGERY Left    left hand  . TONSILLECTOMY      Prior to Admission medications   Medication Sig Start Date End Date Taking? Authorizing Provider  acetaminophen (TYLENOL) 500 MG tablet Take 1,000 mg by mouth every 6 (six) hours as needed for headache.    Historical Provider, MD  albuterol (PROVENTIL HFA;VENTOLIN HFA) 108 (90 Base) MCG/ACT inhaler Inhale 2 puffs into the lungs every 6 (six) hours as needed for  wheezing or shortness of breath.    Historical Provider, MD  cephALEXin (KEFLEX) 500 MG capsule Take 1 capsule (500 mg total) by mouth 3 (three) times daily. 12/16/15   Waynetta Pean, PA-C  ibuprofen (ADVIL,MOTRIN) 800 MG tablet Take 1 tablet (800 mg total) by mouth every 6 (six) hours as needed. Patient taking differently: Take 800 mg by mouth every 6 (six) hours as needed for moderate pain.  11/01/15   Drenda Freeze, MD  naproxen (NAPROSYN) 250 MG tablet Take 1 tablet (250 mg total) by mouth 2 (two) times daily with a meal. 12/16/15   Waynetta Pean, PA-C  Norethindrone Acetate-Ethinyl Estrad-FE (LOESTRIN 24 FE) 1-20 MG-MCG(24) tablet Take 1 tablet by mouth daily. Patient taking differently: Take 1 tablet by mouth every evening.  08/04/15   Shelly Bombard, MD  phenytoin (DILANTIN) 100 MG ER capsule Take 1 capsule (100 mg total) by mouth 3 (three) times daily. 12/16/15   Waynetta Pean, PA-C  Prenat-FeCbn-FeAspGl-FA-Omega (OB COMPLETE PETITE) 35-5-1-200 MG CAPS Take 1 capsule by mouth daily before breakfast. 01/29/15   Shelly Bombard, MD  promethazine (PHENERGAN) 25 MG suppository Place 1 suppository (25 mg total) rectally every 6 (six) hours as needed for nausea. 12/12/15 12/11/16  Nance Pear, MD  promethazine (PHENERGAN) 6.25 MG/5ML syrup Take 20 mLs (25 mg total) by mouth 4 (four) times daily as needed for nausea or vomiting. 11/01/15 10/31/16  Drenda Freeze, MD  SUMAtriptan (IMITREX) 50 MG tablet Take 1 tablet (50 mg total) by mouth once as needed for migraine. May repeat in 2 hours if headache persists or recurs. 12/07/15 12/16/15  Laban Emperor, PA-C    Allergies Milk-related compounds; Mushroom extract complex; Penicillins; Cheese; Latex; and Sulfa antibiotics  Family History  Problem Relation Age of Onset  . Seizures Mother   . Diabetes Maternal Grandmother   . Colon cancer Maternal Grandmother   . Hypertension Maternal Grandmother   . Clotting disorder Maternal Grandmother      Social History Social History  Substance Use Topics  . Smoking status: Passive Smoke Exposure - Never Smoker  . Smokeless tobacco: Never Used  . Alcohol use No    Review of Systems Constitutional: No fever/chills Eyes: No visual changes. ENT: No sore throat. Cardiovascular: Denies chest pain. Respiratory: Denies shortness of breath. Gastrointestinal: No abdominal pain.  No nausea, no vomiting.  No diarrhea.  No constipation. Genitourinary: Negative for dysuria. Musculoskeletal: Negative for back pain. Skin: Negative for rash. Neurological: Positive for for headaches, negative for focal weakness or numbness.  10-point ROS otherwise negative.  ____________________________________________   PHYSICAL EXAM:  VITAL SIGNS: ED Triage Vitals  Enc Vitals Group     BP 01/20/16 2111 119/76     Pulse Rate 01/20/16 2111 (!) 132     Resp 01/20/16 2111 18     Temp 01/20/16 2111 99.8 F (37.7 C)     Temp Source 01/20/16 2111 Oral     SpO2 01/20/16 2111 96 %     Weight 01/20/16 2117 230 lb (104.3 kg)     Height 01/20/16 2117 5\' 3"  (1.6 m)     Head Circumference --      Peak Flow --      Pain Score 01/20/16 2118 10     Pain Loc --      Pain Edu? --      Excl. in [place]? --     Constitutional: Alert and oriented. Well appearing and in no acute distress. Eyes: Conjunctivae are normal. PERRL. EOMI. Head: Atraumatic. Ears:  Healthy appearing ear canals and TMs bilaterally Nose: No congestion/rhinnorhea. Mouth/Throat: Mucous membranes are moist.  Oropharynx non-erythematous. Neck: No stridor.  No meningeal signs.   Cardiovascular: Normal rate, regular rhythm. Good peripheral circulation. Grossly normal heart sounds. Respiratory: Normal respiratory effort.  No retractions. Lungs CTAB. Gastrointestinal: Soft and nontender. No distention.  Musculoskeletal: No lower extremity tenderness nor edema. No gross deformities of extremities. Neurologic:  Normal speech and language. No gross  focal neurologic deficits are appreciated.  Skin:  Skin is warm, dry and intact. No rash noted. Psychiatric: Mood and affect are normal. Speech and behavior are normal.  ____________________________________________   LABS (all labs ordered are listed, but only abnormal results are displayed)  Labs Reviewed  COMPREHENSIVE METABOLIC PANEL - Abnormal; Notable for the following:       Result Value   Potassium 3.4 (*)    Glucose, Bld 128 (*)    All other components within normal limits  URINALYSIS, COMPLETE (UACMP) WITH MICROSCOPIC - Abnormal; Notable for the following:    Color, Urine YELLOW (*)    APPearance CLEAR (*)    Nitrite POSITIVE (*)    Bacteria, UA RARE (*)    Squamous Epithelial / LPF 0-5 (*)    All other components within normal limits  PHENYTOIN LEVEL, TOTAL - Abnormal; Notable for the following:    Phenytoin Lvl <2.5 (*)    All other components  within normal limits  INFLUENZA PANEL BY PCR (TYPE A & B, H1N1) - Abnormal; Notable for the following:    Influenza A By PCR POSITIVE (*)    All other components within normal limits  LIPASE, BLOOD  CBC  POC URINE PREG, ED  POCT PREGNANCY, URINE     Procedures  Critical care: CRITICAL CARE Performed by: Gregor Hams   Total critical care time: 30 minutes  Critical care time was exclusive of separately billable procedures and treating other patients.  Critical care was necessary to treat or prevent imminent or life-threatening deterioration.  Critical care was time spent personally by me on the following activities: development of treatment plan with patient and/or surrogate as well as nursing, discussions with consultants, evaluation of patient's response to treatment, examination of patient, obtaining history from patient or surrogate, ordering and performing treatments and interventions, ordering and review of laboratory studies, ordering and review of radiographic studies, pulse oximetry and re-evaluation of  patient's condition.    INITIAL IMPRESSION / ASSESSMENT AND PLAN / ED COURSE  Pertinent labs & imaging results that were available during my care of the patient were reviewed by me and considered in my medical decision making (see chart for details).  ----------------------------------------- 11:45 PM on 01/20/2016 -----------------------------------------  I was notified by the nursing staff that I was needed in the patient's room I responded quickly and found the patient to be having a generalized tonic-clonic seizure at that time. I instructed the staff to administer 1 mg of Ativan which was done. Patient stopped seizing total seizure episode lasted approximately 2 minutes.  Patient given phenytoin 1 g IV and will be prescribed Dilantin for home. In addition patient given Tamiflu and will be prescribed same for home.   Clinical Course     ____________________________________________  FINAL CLINICAL IMPRESSION(S) / ED DIAGNOSES  Final diagnoses:  Influenza  Seizure   MEDICATIONS GIVEN DURING THIS VISIT:  Medications  oseltamivir (TAMIFLU) capsule 75 mg (not administered)  fosPHENYtoin (CEREBYX) 1,000 mg PE in sodium chloride 0.9 % 50 mL IVPB (not administered)  ondansetron (ZOFRAN-ODT) disintegrating tablet 4 mg (4 mg Oral Given 01/20/16 2134)  acetaminophen (TYLENOL) tablet 650 mg (650 mg Oral Given 01/20/16 2134)  ibuprofen (ADVIL,MOTRIN) tablet 800 mg (800 mg Oral Given 01/20/16 2327)     NEW OUTPATIENT MEDICATIONS STARTED DURING THIS VISIT:  New Prescriptions   No medications on file    Modified Medications   No medications on file    Discontinued Medications   No medications on file     Note:  This document was prepared using Dragon voice recognition software and may include unintentional dictation errors.    Gregor Hams, MD 01/21/16 [phone]

## 2016-01-21 NOTE — ED Notes (Signed)
Pt left with fiance.

## 2016-01-24 ENCOUNTER — Ambulatory Visit: Payer: Medicaid Other | Admitting: Obstetrics

## 2016-02-13 ENCOUNTER — Encounter (HOSPITAL_COMMUNITY): Payer: Self-pay | Admitting: Emergency Medicine

## 2016-02-13 ENCOUNTER — Emergency Department (HOSPITAL_COMMUNITY)
Admission: EM | Admit: 2016-02-13 | Discharge: 2016-02-13 | Disposition: A | Discharge status: Home / Self Care | Payer: Medicaid Other | Attending: Emergency Medicine | Admitting: Emergency Medicine

## 2016-02-13 DIAGNOSIS — Z7722 Contact with and (suspected) exposure to environmental tobacco smoke (acute) (chronic): Secondary | ICD-10-CM | POA: Insufficient documentation

## 2016-02-13 DIAGNOSIS — N611 Abscess of the breast and nipple: Secondary | ICD-10-CM | POA: Insufficient documentation

## 2016-02-13 DIAGNOSIS — F909 Attention-deficit hyperactivity disorder, unspecified type: Secondary | ICD-10-CM | POA: Diagnosis not present

## 2016-02-13 DIAGNOSIS — L0291 Cutaneous abscess, unspecified: Secondary | ICD-10-CM

## 2016-02-13 DIAGNOSIS — Z91040 Latex allergy status: Secondary | ICD-10-CM | POA: Diagnosis not present

## 2016-02-13 DIAGNOSIS — J45909 Unspecified asthma, uncomplicated: Secondary | ICD-10-CM | POA: Insufficient documentation

## 2016-02-13 MED ORDER — HYDROCODONE-ACETAMINOPHEN 5-325 MG PO TABS
1.0000 | ORAL_TABLET | Freq: Once | ORAL | Status: AC
  Administered 2016-02-13: 1 via ORAL
  Filled 2016-02-13: qty 1

## 2016-02-13 MED ORDER — LIDOCAINE-EPINEPHRINE (PF) 2 %-1:200000 IJ SOLN
10.0000 mL | Freq: Once | INTRAMUSCULAR | Status: AC
  Administered 2016-02-13: 10 mL
  Filled 2016-02-13: qty 20

## 2016-02-13 MED ORDER — IBUPROFEN 600 MG PO TABS: 600.0000 mg | ORAL_TABLET | Freq: Four times a day (QID) | ORAL | 0 refills | Status: DC | PRN

## 2016-02-13 MED ORDER — CLINDAMYCIN HCL 150 MG PO CAPS: 300.0000 mg | ORAL_CAPSULE | Freq: Three times a day (TID) | ORAL | 0 refills | Status: DC

## 2016-02-13 NOTE — ED Provider Notes (Signed)
[facility] DEPT Provider Note   CSN: [id number] Arrival date & time: 02/13/16  1856     History   Chief Complaint Chief Complaint  Patient presents with  . Abscess    left breast    HPI Latoya Stark is a 21 y.o. female.  HPI   Patient is a 21 year old female history of asthma and seizures who presents to the ED with complaint of abscess. Patient reports over the past 4-5 days she has had worsening abscess to the middle of her chest which she states has now spread to her left breast. Endorses associated tenderness, swelling, redness and warmth. Denies drainage. Patient reports she has been taking ibuprofen at home without relief. Reports history of abscesses in the past but denies being on any recent antibiotics. Denies fever, difficulty breathing, cough, abdominal pain, nausea, vomiting, nipple discharge, rash, breast lump.  Past Medical History:  Diagnosis Date  . ADHD (attention deficit hyperactivity disorder)   . Anemia   . Anxiety   . Asthma   . Depression   . Headache   . Seizures ([place])    "when I get too hot"  . Trichomonas infection   . UTI (lower urinary tract infection)     Patient Active Problem List   Diagnosis Date Noted  . Constipation 02/24/2014  . CN (constipation) 12/23/2013  . Gonorrhea in female 10/05/2013  . BV (bacterial vaginosis) 09/22/2013  . Candidiasis of vulva and vagina 09/22/2013    Past Surgical History:  Procedure Laterality Date  . ADENOIDECTOMY    . DILATION AND EVACUATION N/A 08/09/2013   Procedure: DILATATION AND EVACUATION;  Surgeon: Frederico Hamman, MD;  Location: [facility];  Service: Gynecology;  Laterality: N/A;  . ORTHOPEDIC SURGERY Left    left hand  . TONSILLECTOMY      OB History    Gravida Para Term Preterm AB Living   2       2     SAB TAB Ectopic Multiple Live Births   2               Home Medications    Prior to Admission medications   Medication Sig Start Date End Date Taking? Authorizing Provider    acetaminophen (TYLENOL) 500 MG tablet Take 1,000 mg by mouth every 6 (six) hours as needed for headache.    Historical Provider, MD  albuterol (PROVENTIL HFA;VENTOLIN HFA) 108 (90 Base) MCG/ACT inhaler Inhale 2 puffs into the lungs every 6 (six) hours as needed for wheezing or shortness of breath.    Historical Provider, MD  cephALEXin (KEFLEX) 500 MG capsule Take 1 capsule (500 mg total) by mouth 3 (three) times daily. 12/16/15   Waynetta Pean, PA-C  clindamycin (CLEOCIN) 150 MG capsule Take 2 capsules (300 mg total) by mouth 3 (three) times daily. May dispense as 150mg  capsules 02/13/16   Nona Dell, PA-C  ibuprofen (ADVIL,MOTRIN) 800 MG tablet Take 1 tablet (800 mg total) by mouth every 6 (six) hours as needed. Patient taking differently: Take 800 mg by mouth every 6 (six) hours as needed for moderate pain.  11/01/15   Drenda Freeze, MD  naproxen (NAPROSYN) 250 MG tablet Take 1 tablet (250 mg total) by mouth 2 (two) times daily with a meal. 12/16/15   Waynetta Pean, PA-C  Norethindrone Acetate-Ethinyl Estrad-FE (LOESTRIN 24 FE) 1-20 MG-MCG(24) tablet Take 1 tablet by mouth daily. Patient taking differently: Take 1 tablet by mouth every evening.  08/04/15   Shelly Bombard,  MD  phenytoin (DILANTIN) 100 MG ER capsule Take 1 capsule (100 mg total) by mouth 3 (three) times daily. 12/16/15   Waynetta Pean, PA-C  phenytoin (DILANTIN) 100 MG ER capsule Take 1 capsule (100 mg total) by mouth 3 (three) times daily. 01/20/16 01/19/17  Gregor Hams, MD  Prenat-FeCbn-FeAspGl-FA-Omega (OB COMPLETE PETITE) 35-5-1-200 MG CAPS Take 1 capsule by mouth daily before breakfast. 01/29/15   Shelly Bombard, MD  promethazine (PHENERGAN) 25 MG suppository Place 1 suppository (25 mg total) rectally every 6 (six) hours as needed for nausea. 12/12/15 12/11/16  Nance Pear, MD  promethazine (PHENERGAN) 6.25 MG/5ML syrup Take 20 mLs (25 mg total) by mouth 4 (four) times daily as needed for nausea or  vomiting. 11/01/15 10/31/16  Drenda Freeze, MD  SUMAtriptan (IMITREX) 50 MG tablet Take 1 tablet (50 mg total) by mouth once as needed for migraine. May repeat in 2 hours if headache persists or recurs. 12/07/15 12/16/15  Laban Emperor, PA-C    Family History Family History  Problem Relation Age of Onset  . Seizures Mother   . Diabetes Maternal Grandmother   . Colon cancer Maternal Grandmother   . Hypertension Maternal Grandmother   . Clotting disorder Maternal Grandmother     Social History Social History  Substance Use Topics  . Smoking status: Passive Smoke Exposure - Never Smoker  . Smokeless tobacco: Never Used  . Alcohol use No     Allergies   Milk-related compounds; Mushroom extract complex; Penicillins; Cheese; Latex; and Sulfa antibiotics   Review of Systems Review of Systems  Skin: Positive for color change (redness) and wound (abscess).  All other systems reviewed and are negative.    Physical Exam Updated Vital Signs BP (!) 129/114   Pulse 88   Temp 98.1 F (36.7 C) (Oral)   Resp 20   Ht 5\' 3"  (1.6 m)   Wt 99.8 kg   SpO2 100%   BMI 38.97 kg/m   Physical Exam  Constitutional: She is oriented to person, place, and time. She appears well-developed and well-nourished.  HENT:  Head: Normocephalic and atraumatic.  Eyes: Conjunctivae and EOM are normal. Right eye exhibits no discharge. Left eye exhibits no discharge. No scleral icterus.  Neck: Normal range of motion. Neck supple.  Cardiovascular: Normal rate, regular rhythm, normal heart sounds and intact distal pulses.   Pulmonary/Chest: Effort normal and breath sounds normal. No respiratory distress. She has no wheezes. She has no rales. She exhibits tenderness (mild TTP over midsternal chest wall surrounding abscess). Right breast exhibits no inverted nipple, no mass, no nipple discharge, no skin change and no tenderness. Left breast exhibits skin change (redness) and tenderness. Left breast exhibits  no inverted nipple, no mass and no nipple discharge.    Abdominal: Soft. There is no tenderness.  Musculoskeletal: She exhibits no edema.  Neurological: She is alert and oriented to person, place, and time.  Skin: Skin is warm and dry.  4x4 cm area of swelling, fluctuance and tenderness to inferior mid-sternum and left anterior chest wall extending towards left breast but does not appear to involve breast tissue. Moderate surrounding swelling, erythema and warmth which extends across left medial breast. No drainage.   Nursing note and vitals reviewed.    ED Treatments / Results  Labs (all labs ordered are listed, but only abnormal results are displayed) Labs Reviewed - No data to display  EKG  EKG Interpretation None       Radiology No results found.  Procedures .Marland KitchenIncision and Drainage Date/Time: 02/13/2016 9:40 PM Performed by: Nona Dell Authorized by: Nona Dell   Consent:    Consent obtained:  Verbal   Consent given by:  Patient Location:    Type:  Abscess   Size:  4x4cm   Location: midsternal chest wall. Pre-procedure details:    Skin preparation:  Betadine Anesthesia (see MAR for exact dosages):    Anesthesia method:  Local infiltration   Local anesthetic:  Lidocaine 2% WITH epi Procedure type:    Complexity:  Simple Procedure details:    Incision types:  Single straight   Incision depth:  Dermal   Scalpel blade:  11   Wound management:  Probed and deloculated and irrigated with saline   Drainage:  Bloody and purulent   Drainage amount:  Moderate   Wound treatment:  Wound left open   Packing materials:  None Post-procedure details:    Patient tolerance of procedure:  Tolerated well, no immediate complications   (including critical care time)  Medications Ordered in ED Medications  lidocaine-EPINEPHrine (XYLOCAINE W/EPI) 2 %-1:200000 (PF) injection 10 mL (10 mLs Infiltration Given by Other 02/13/16 2057)   HYDROcodone-acetaminophen (NORCO/VICODIN) 5-325 MG per tablet 1 tablet (1 tablet Oral Given 02/13/16 2136)     Initial Impression / Assessment and Plan / ED Course  I have reviewed the triage vital signs and the nursing notes.  Pertinent labs & imaging results that were available during my care of the patient were reviewed by me and considered in my medical decision making (see chart for details).     Patient resents with abscess to her mid sternal chest wall that has worsened over the past 4 days. Denies fever or drainage. Reports history of recurrent abscesses to other areas, denies any recent antibiotic use. VSS. Exam revealed 4 x 4 centimeter abscess with moderate surrounding swelling, erythema and warmth to midsternal chest wall extending across to left medial breast. Abscess appears to not be involved in breast tissue. I&D performed in the ED without any complete locations. Cellulitis marked in the ED. plan to discharge patient home with clindamycin due to current abscess with associated cellulitis and history of recurrent abscesses. Discussed wound care and symptomatic treatment with patient. Advised patient to return to the ED in 2 days for wound recheck. Discussed or return precautions.  Final Clinical Impressions(s) / ED Diagnoses   Final diagnoses:  Abscess    New Prescriptions New Prescriptions   CLINDAMYCIN (CLEOCIN) 150 MG CAPSULE    Take 2 capsules (300 mg total) by mouth 3 (three) times daily. May dispense as 150mg  capsules     Chesley Noon [place], [place] 02/13/16 2143    Margette Fast, MD 02/14/16 [phone]

## 2016-02-13 NOTE — Discharge Instructions (Signed)
Take your antibiotic as prescribed until completed. I recommend continuing to apply warm compresses to the area 3-4 times daily for 15-20 minutes. You may also take 600 mg ibuprofen every 4 hours as needed for pain relief.  I recommend returning to the ED or being seen by her primary care provider in 2 days for wound recheck. Return to emergency department sooner if symptoms worsen or new onset of fever, worsening redness, swelling, warmth or drainage, difficulty breathing, vomiting.

## 2016-02-13 NOTE — ED Triage Notes (Signed)
Pt presents with abscess on left breast with redness radiating towards nipple for 4-5 days. Breast is warm to touch and states very painful.

## 2016-03-09 ENCOUNTER — Encounter (HOSPITAL_COMMUNITY): Payer: Self-pay | Admitting: *Deleted

## 2016-03-09 DIAGNOSIS — F909 Attention-deficit hyperactivity disorder, unspecified type: Secondary | ICD-10-CM | POA: Diagnosis not present

## 2016-03-09 DIAGNOSIS — R1084 Generalized abdominal pain: Secondary | ICD-10-CM | POA: Diagnosis present

## 2016-03-09 DIAGNOSIS — Z7722 Contact with and (suspected) exposure to environmental tobacco smoke (acute) (chronic): Secondary | ICD-10-CM | POA: Insufficient documentation

## 2016-03-09 DIAGNOSIS — Z91040 Latex allergy status: Secondary | ICD-10-CM | POA: Insufficient documentation

## 2016-03-09 DIAGNOSIS — Z79899 Other long term (current) drug therapy: Secondary | ICD-10-CM | POA: Insufficient documentation

## 2016-03-09 DIAGNOSIS — J45909 Unspecified asthma, uncomplicated: Secondary | ICD-10-CM | POA: Insufficient documentation

## 2016-03-09 NOTE — ED Notes (Signed)
UNSUCESSFUL ATTEMPT TO COLLECT BLOOD SAMPLES

## 2016-03-09 NOTE — ED Triage Notes (Signed)
Aching abdominal pain in RUQ, LUQ, RLQ x a couple of days.  Pt denies any GYN or GU symptoms with this.  No fever, chills with this.  Nausea this am with vomiting x1 this am.  LBM was this am and it was normal.

## 2016-03-10 ENCOUNTER — Emergency Department (HOSPITAL_COMMUNITY)
Admission: EM | Admit: 2016-03-10 | Discharge: 2016-03-10 | Disposition: A | Discharge status: Home / Self Care | Payer: Medicaid Other | Attending: Emergency Medicine | Admitting: Emergency Medicine

## 2016-03-10 ENCOUNTER — Emergency Department (HOSPITAL_COMMUNITY): Payer: Medicaid Other

## 2016-03-10 DIAGNOSIS — R1084 Generalized abdominal pain: Secondary | ICD-10-CM

## 2016-03-10 HISTORY — DX: Obesity, unspecified: E66.9

## 2016-03-10 LAB — URINALYSIS, ROUTINE W REFLEX MICROSCOPIC
BILIRUBIN URINE: NEGATIVE
Bacteria, UA: NONE SEEN
GLUCOSE, UA: NEGATIVE mg/dL
Hgb urine dipstick: NEGATIVE
KETONES UR: NEGATIVE mg/dL
LEUKOCYTES UA: NEGATIVE
Nitrite: NEGATIVE
Protein, ur: NEGATIVE mg/dL
SPECIFIC GRAVITY, URINE: 1.016 (ref 1.005–1.030)
pH: 6 (ref 5.0–8.0)

## 2016-03-10 LAB — COMPREHENSIVE METABOLIC PANEL
ALK PHOS: 74 U/L (ref 38–126)
ALT: 29 U/L (ref 14–54)
AST: 26 U/L (ref 15–41)
Albumin: 4 g/dL (ref 3.5–5.0)
Anion gap: 6 (ref 5–15)
BILIRUBIN TOTAL: 0.5 mg/dL (ref 0.3–1.2)
BUN: 9 mg/dL (ref 6–20)
CALCIUM: 9.1 mg/dL (ref 8.9–10.3)
CO2: 26 mmol/L (ref 22–32)
CREATININE: 0.71 mg/dL (ref 0.44–1.00)
Chloride: 107 mmol/L (ref 101–111)
Glucose, Bld: 76 mg/dL (ref 65–99)
Potassium: 3.9 mmol/L (ref 3.5–5.1)
Sodium: 139 mmol/L (ref 135–145)
TOTAL PROTEIN: 7.2 g/dL (ref 6.5–8.1)

## 2016-03-10 LAB — CBC
HCT: 37.8 % (ref 36.0–46.0)
Hemoglobin: 12.9 g/dL (ref 12.0–15.0)
MCH: 28.9 pg (ref 26.0–34.0)
MCHC: 34.1 g/dL (ref 30.0–36.0)
MCV: 84.8 fL (ref 78.0–100.0)
Platelets: 329 10*3/uL (ref 150–400)
RBC: 4.46 MIL/uL (ref 3.87–5.11)
RDW: 13.3 % (ref 11.5–15.5)
WBC: 5.2 10*3/uL (ref 4.0–10.5)

## 2016-03-10 LAB — PREGNANCY, URINE: Preg Test, Ur: NEGATIVE

## 2016-03-10 MED ORDER — ONDANSETRON HCL 4 MG/2ML IJ SOLN
4.0000 mg | Freq: Once | INTRAMUSCULAR | Status: AC
  Administered 2016-03-10: 4 mg via INTRAVENOUS
  Filled 2016-03-10: qty 2

## 2016-03-10 MED ORDER — HYOSCYAMINE SULFATE 0.5 MG/ML IJ SOLN
0.1250 mg | Freq: Once | INTRAMUSCULAR | Status: AC
  Administered 2016-03-10: 0.125 mg via INTRAVENOUS
  Filled 2016-03-10: qty 0.25

## 2016-03-10 MED ORDER — HYOSCYAMINE SULFATE SL 0.125 MG SL SUBL: 0.1250 mg | SUBLINGUAL_TABLET | Freq: Three times a day (TID) | SUBLINGUAL | 0 refills | Status: DC | PRN

## 2016-03-10 MED ORDER — ONDANSETRON HCL 4 MG PO TABS: 4.0000 mg | ORAL_TABLET | Freq: Four times a day (QID) | ORAL | 0 refills | Status: DC

## 2016-03-10 NOTE — ED Notes (Signed)
One unsuccessful attempt to start iv and draw blood.

## 2016-03-10 NOTE — ED Provider Notes (Signed)
[facility] DEPT Provider Note   CSN: [id number] Arrival date & time: 03/09/16  2134  By signing my name below, I, [name], attest that this documentation has been prepared under the direction and in the presence of Charlann Lange PA-C, Electronically Signed: [name], Scribe. 03/10/16. 1:15 AM.  History   Chief Complaint Chief Complaint  Patient presents with  . Abdominal Pain   HPI Comments: Latoya Stark is a 21 y.o. female who presents to the Emergency Department complaining of gradually worsening, constant generalized abdominal pain that began 3 days ago. Patient states her abdominal pain began in the epigastric area and spread throughout the entire abdomen. Patient reports associated nausea, distention, vomiting (x1 episode today). Patient describes her pain as throbbing, burning with a severity of 10/10. Patient states her pain is worse while eating. Patient reports taking ibuprofen, tylenol, and Tums, and unknown nausea medication with minimal improvement. Patient states this is a new problem. Patient states she is currently going to a gastroenterologist. No bowel changes in the last 4 days. Patient is currently sexually active with one female. Patient has a hx of trichomonas. Patient states she is allergic to latex, so she doesn't always use condoms. Patient states her last normal bowel movement was this morning. Patient is unsure whether she is pregnant or not, she does have a history of a miscarriage. Patient denies any fever, hematuria, hematochezia, dysuria, chest pain, shortness of breath, numbness or tingling of the lower extremity, loss of bladder/bowel control, vaginal itching, vaginal bleeding, vaginal odor, or unusual vaginal discharge. LNMP was 02/06/16.   The history is provided by the patient. No language interpreter was used.    Past Medical History:  Diagnosis Date  . ADHD (attention deficit hyperactivity disorder)   . Anemia   . Anxiety   . Asthma   .  Depression   . Headache   . Obesity   . Seizures ([place])    "when I get too hot"  . Trichomonas infection   . UTI (lower urinary tract infection)     Patient Active Problem List   Diagnosis Date Noted  . Constipation 02/24/2014  . CN (constipation) 12/23/2013  . Gonorrhea in female 10/05/2013  . BV (bacterial vaginosis) 09/22/2013  . Candidiasis of vulva and vagina 09/22/2013    Past Surgical History:  Procedure Laterality Date  . ADENOIDECTOMY    . DILATION AND EVACUATION N/A 08/09/2013   Procedure: DILATATION AND EVACUATION;  Surgeon: Frederico Hamman, MD;  Location: [facility];  Service: Gynecology;  Laterality: N/A;  . ORTHOPEDIC SURGERY Left    left hand  . TONSILLECTOMY      OB History    Gravida Para Term Preterm AB Living   2       2     SAB TAB Ectopic Multiple Live Births   2               Home Medications    Prior to Admission medications   Medication Sig Start Date End Date Taking? Authorizing Provider  acetaminophen (TYLENOL) 500 MG tablet Take 1,000 mg by mouth every 6 (six) hours as needed for headache.   Yes Historical Provider, MD  albuterol (PROVENTIL HFA;VENTOLIN HFA) 108 (90 Base) MCG/ACT inhaler Inhale 2 puffs into the lungs every 6 (six) hours as needed for wheezing or shortness of breath.   Yes Historical Provider, MD  ibuprofen (ADVIL,MOTRIN) 200 MG tablet Take 400 mg by mouth every 6 (six) hours as needed for moderate  pain.   Yes Historical Provider, MD  naproxen (NAPROSYN) 250 MG tablet Take 1 tablet (250 mg total) by mouth 2 (two) times daily with a meal. 12/16/15  Yes Waynetta Pean, PA-C  Norethindrone Acetate-Ethinyl Estrad-FE (LOESTRIN 24 FE) 1-20 MG-MCG(24) tablet Take 1 tablet by mouth daily. Patient taking differently: Take 1 tablet by mouth every evening.  08/04/15  Yes Shelly Bombard, MD  phenytoin (DILANTIN) 100 MG ER capsule Take 1 capsule (100 mg total) by mouth 3 (three) times daily. 01/20/16 01/19/17 Yes Gregor Hams, MD    Prenat-FeCbn-FeAspGl-FA-Omega (OB COMPLETE PETITE) 35-5-1-200 MG CAPS Take 1 capsule by mouth daily before breakfast. 01/29/15  Yes Shelly Bombard, MD  promethazine (PHENERGAN) 6.25 MG/5ML syrup Take 20 mLs (25 mg total) by mouth 4 (four) times daily as needed for nausea or vomiting. 11/01/15 10/31/16 Yes Drenda Freeze, MD  cephALEXin (KEFLEX) 500 MG capsule Take 1 capsule (500 mg total) by mouth 3 (three) times daily. Patient not taking: Reported on 03/10/2016 12/16/15   Waynetta Pean, PA-C  clindamycin (CLEOCIN) 150 MG capsule Take 2 capsules (300 mg total) by mouth 3 (three) times daily. May dispense as 150mg  capsules Patient not taking: Reported on 03/10/2016 02/13/16   Nona Dell, PA-C  ibuprofen (ADVIL,MOTRIN) 600 MG tablet Take 1 tablet (600 mg total) by mouth every 6 (six) hours as needed. Patient not taking: Reported on 03/10/2016 02/13/16   Nona Dell, PA-C  phenytoin (DILANTIN) 100 MG ER capsule Take 1 capsule (100 mg total) by mouth 3 (three) times daily. Patient not taking: Reported on 03/10/2016 12/16/15   Waynetta Pean, PA-C  promethazine (PHENERGAN) 25 MG suppository Place 1 suppository (25 mg total) rectally every 6 (six) hours as needed for nausea. Patient not taking: Reported on 03/10/2016 12/12/15 12/11/16  Nance Pear, MD  SUMAtriptan (IMITREX) 50 MG tablet Take 1 tablet (50 mg total) by mouth once as needed for migraine. May repeat in 2 hours if headache persists or recurs. Patient not taking: Reported on 03/10/2016 12/07/15 12/16/15  Laban Emperor, PA-C    Family History Family History  Problem Relation Age of Onset  . Seizures Mother   . Diabetes Maternal Grandmother   . Colon cancer Maternal Grandmother   . Hypertension Maternal Grandmother   . Clotting disorder Maternal Grandmother     Social History Social History  Substance Use Topics  . Smoking status: Passive Smoke Exposure - Never Smoker  . Smokeless tobacco: Never Used  . Alcohol use No      Allergies   Milk-related compounds; Mushroom extract complex; Penicillins; Cheese; Latex; and Sulfa antibiotics   Review of Systems Review of Systems  Constitutional: Negative for chills and fever.  Cardiovascular: Negative for chest pain.  Gastrointestinal: Positive for abdominal distention, abdominal pain (generalized), nausea and vomiting. Negative for blood in stool, constipation and diarrhea.  Genitourinary: Negative for dysuria, hematuria, vaginal bleeding, vaginal discharge and vaginal pain.     Physical Exam Updated Vital Signs BP 128/81 (BP Location: Left Arm)   Pulse 80   Temp 98.6 F (37 C) (Oral)   Resp 20   Wt (!) 300 lb 3.2 oz (136.2 kg)   LMP 02/09/2016 (Approximate)   SpO2 100%   BMI 53.18 kg/m   Physical Exam  Constitutional: She is oriented to person, place, and time. She appears well-developed and well-nourished.  HENT:  Head: Normocephalic.  Neck: Normal range of motion. Neck supple.  Cardiovascular: Normal rate and regular rhythm.   Pulmonary/Chest:  Effort normal and breath sounds normal. She has no wheezes. She has no rales.  Abdominal: Soft. Bowel sounds are normal. She exhibits distension (mildly distended, soft abdomen). There is tenderness (Diffuse abdominal tenderness. ). There is no rebound and no guarding.  Musculoskeletal: Normal range of motion.  Neurological: She is alert and oriented to person, place, and time.  Skin: Skin is warm and dry. No rash noted.  Psychiatric: She has a normal mood and affect.     ED Treatments / Results  DIAGNOSTIC STUDIES: Oxygen Saturation is 100% on RA, normal by my interpretation.    COORDINATION OF CARE: 1:45 AM Discussed treatment plan with pt at bedside and pt agreed to plan, which includes an abdominal CT.   Labs (all labs ordered are listed, but only abnormal results are displayed) Labs Reviewed  COMPREHENSIVE METABOLIC PANEL  CBC  URINALYSIS, ROUTINE W REFLEX MICROSCOPIC  POC URINE PREG,  ED   Results for orders placed or performed during the hospital encounter of 03/10/16  Comprehensive metabolic panel  Result Value Ref Range   Sodium 139 135 - 145 mmol/L   Potassium 3.9 3.5 - 5.1 mmol/L   Chloride 107 101 - 111 mmol/L   CO2 26 22 - 32 mmol/L   Glucose, Bld 76 65 - 99 mg/dL   BUN 9 6 - 20 mg/dL   Creatinine, Ser 0.71 0.44 - 1.00 mg/dL   Calcium 9.1 8.9 - 10.3 mg/dL   Total Protein 7.2 6.5 - 8.1 g/dL   Albumin 4.0 3.5 - 5.0 g/dL   AST 26 15 - 41 U/L   ALT 29 14 - 54 U/L   Alkaline Phosphatase 74 38 - 126 U/L   Total Bilirubin 0.5 0.3 - 1.2 mg/dL   GFR calc non Af Amer >60 >60 mL/min   GFR calc Af Amer >60 >60 mL/min   Anion gap 6 5 - 15  CBC  Result Value Ref Range   WBC 5.2 4.0 - 10.5 K/uL   RBC 4.46 3.87 - 5.11 MIL/uL   Hemoglobin 12.9 12.0 - 15.0 g/dL   HCT 37.8 36.0 - 46.0 %   MCV 84.8 78.0 - 100.0 fL   MCH 28.9 26.0 - 34.0 pg   MCHC 34.1 30.0 - 36.0 g/dL   RDW 13.3 11.5 - 15.5 %   Platelets 329 150 - 400 K/uL  Urinalysis, Routine w reflex microscopic  Result Value Ref Range   Color, Urine YELLOW YELLOW   APPearance CLEAR CLEAR   Specific Gravity, Urine 1.016 1.005 - 1.030   pH 6.0 5.0 - 8.0   Glucose, UA NEGATIVE NEGATIVE mg/dL   Hgb urine dipstick NEGATIVE NEGATIVE   Bilirubin Urine NEGATIVE NEGATIVE   Ketones, ur NEGATIVE NEGATIVE mg/dL   Protein, ur NEGATIVE NEGATIVE mg/dL   Nitrite NEGATIVE NEGATIVE   Leukocytes, UA NEGATIVE NEGATIVE   RBC / HPF 0-5 0 - 5 RBC/hpf   WBC, UA 0-5 0 - 5 WBC/hpf   Bacteria, UA NONE SEEN NONE SEEN   Squamous Epithelial / LPF 0-5 (A) NONE SEEN   Mucous PRESENT   Pregnancy, urine  Result Value Ref Range   Preg Test, Ur NEGATIVE NEGATIVE     EKG  EKG Interpretation None       Radiology No results found. Dg Abdomen Acute W/chest  Result Date: 03/10/2016 CLINICAL DATA:  Mid abdominal pain, nausea and vomiting for 3 days. EXAM: DG ABDOMEN ACUTE W/ 1V CHEST COMPARISON:  CT abdomen and pelvis November 01, 2015 FINDINGS: Cardiomediastinal silhouette is normal. Lungs are clear, no pleural effusions. No pneumothorax. Soft tissue planes and included osseous structures are nonacute. Mild lower thoracic levoscoliosis may be positional. Bowel gas pattern is nondilated and nonobstructive. No intra-abdominal mass effect, pathologic calcifications or free air. Soft tissue planes and included osseous structures are non-suspicious. IMPRESSION: Normal chest. Normal bowel gas pattern. Electronically Signed   By: Elon Alas M.D.   On: 03/10/2016 04:42    Procedures Procedures (including critical care time)  Medications Ordered in ED Medications - No data to display   Initial Impression / Assessment and Plan / ED Course  I have reviewed the triage vital signs and the nursing notes.  Pertinent labs & imaging results that were available during my care of the patient were reviewed by me and considered in my medical decision making (see chart for details).     Patient presents with diffuse abdominal discomfort. She has a benign abdomen on exam with positive BS throughout. No risk factors for obstruction.   She is given IV Levsin and zofran with resolution of symptoms. Labs reassuring. She has a normal abdominal series. She is felt stable for discharge home with return precautions explained.   Final Clinical Impressions(s) / ED Diagnoses   Final diagnoses:  None   1. Abdominal pain  New Prescriptions New Prescriptions   No medications on file   I personally performed the services described in this documentation, which was scribed in my presence. The recorded information has been reviewed and is accurate.      Charlann Lange, PA-C 03/10/16 0515    Veatrice Kells, MD 03/10/16 [phone]

## 2016-04-07 ENCOUNTER — Emergency Department
Admission: EM | Admit: 2016-04-07 | Discharge: 2016-04-07 | Disposition: A | Discharge status: Home / Self Care | Payer: Medicaid Other | Attending: Emergency Medicine | Admitting: Emergency Medicine

## 2016-04-07 ENCOUNTER — Encounter: Payer: Self-pay | Admitting: Emergency Medicine

## 2016-04-07 DIAGNOSIS — R569 Unspecified convulsions: Secondary | ICD-10-CM | POA: Diagnosis not present

## 2016-04-07 DIAGNOSIS — Z79899 Other long term (current) drug therapy: Secondary | ICD-10-CM | POA: Insufficient documentation

## 2016-04-07 DIAGNOSIS — Z7722 Contact with and (suspected) exposure to environmental tobacco smoke (acute) (chronic): Secondary | ICD-10-CM | POA: Insufficient documentation

## 2016-04-07 DIAGNOSIS — R7889 Finding of other specified substances, not normally found in blood: Secondary | ICD-10-CM

## 2016-04-07 DIAGNOSIS — J45909 Unspecified asthma, uncomplicated: Secondary | ICD-10-CM | POA: Insufficient documentation

## 2016-04-07 DIAGNOSIS — Z791 Long term (current) use of non-steroidal anti-inflammatories (NSAID): Secondary | ICD-10-CM | POA: Diagnosis not present

## 2016-04-07 DIAGNOSIS — Z5181 Encounter for therapeutic drug level monitoring: Secondary | ICD-10-CM | POA: Insufficient documentation

## 2016-04-07 LAB — CBC
HEMATOCRIT: 37.4 % (ref 35.0–47.0)
HEMOGLOBIN: 13.1 g/dL (ref 12.0–16.0)
MCH: 29.8 pg (ref 26.0–34.0)
MCHC: 34.9 g/dL (ref 32.0–36.0)
MCV: 85.5 fL (ref 80.0–100.0)
Platelets: 358 10*3/uL (ref 150–440)
RBC: 4.37 MIL/uL (ref 3.80–5.20)
RDW: 13.8 % (ref 11.5–14.5)
WBC: 5.3 10*3/uL (ref 3.6–11.0)

## 2016-04-07 LAB — BASIC METABOLIC PANEL
ANION GAP: 6 (ref 5–15)
BUN: 10 mg/dL (ref 6–20)
CO2: 25 mmol/L (ref 22–32)
Calcium: 8.8 mg/dL — ABNORMAL LOW (ref 8.9–10.3)
Chloride: 104 mmol/L (ref 101–111)
Creatinine, Ser: 0.68 mg/dL (ref 0.44–1.00)
GLUCOSE: 96 mg/dL (ref 65–99)
POTASSIUM: 3.9 mmol/L (ref 3.5–5.1)
Sodium: 135 mmol/L (ref 135–145)

## 2016-04-07 LAB — PHENYTOIN LEVEL, TOTAL

## 2016-04-07 MED ORDER — PHENYTOIN SODIUM EXTENDED 100 MG PO CAPS
400.0000 mg | ORAL_CAPSULE | Freq: Once | ORAL | Status: AC
  Administered 2016-04-07: 400 mg via ORAL
  Filled 2016-04-07: qty 4

## 2016-04-07 MED ORDER — PHENYTOIN SODIUM EXTENDED 100 MG PO CAPS: 100.0000 mg | ORAL_CAPSULE | Freq: Three times a day (TID) | ORAL | 0 refills | Status: DC

## 2016-04-07 NOTE — ED Triage Notes (Signed)
Pt had witnessed seizure X 2 at work that lasted 15 seconds each per coworkers. Pt reports has been a while since last seizure. c/o headache. Has not taken seizure meds in couple weeks.

## 2016-04-07 NOTE — ED Provider Notes (Signed)
[facility] Emergency Department Provider Note ____________________________________________   I have reviewed the triage vital signs and the triage nursing note.  HISTORY  Chief Complaint Seizures   Historian Patient  HPI Latoya Stark is a 21 y.o. female with a history of seizures, supposed to take dilantin 100mg  three times daily reported to have had seizure at work today.  Came in EMS, report from EMS from coworkers, lost conciousnessand shaking.  I did not have a chance to speak directly to any witnesses.  Mom did show up later, reports patient is noncompliant with her medication.  Mom had received phone call from coworkers and there is no report of trauma in their discussion either.  Patient states she thinks she had a seizure but does not really remember it. States she has mild headache. No weakness or numbness.    Past Medical History:  Diagnosis Date  . ADHD (attention deficit hyperactivity disorder)   . Anemia   . Anxiety   . Asthma   . Depression   . Headache   . Obesity   . Seizures ([place])    "when I get too hot"  . Trichomonas infection   . UTI (lower urinary tract infection)     Patient Active Problem List   Diagnosis Date Noted  . Constipation 02/24/2014  . CN (constipation) 12/23/2013  . Gonorrhea in female 10/05/2013  . BV (bacterial vaginosis) 09/22/2013  . Candidiasis of vulva and vagina 09/22/2013    Past Surgical History:  Procedure Laterality Date  . ADENOIDECTOMY    . DILATION AND EVACUATION N/A 08/09/2013   Procedure: DILATATION AND EVACUATION;  Surgeon: Frederico Hamman, MD;  Location: [facility];  Service: Gynecology;  Laterality: N/A;  . ORTHOPEDIC SURGERY Left    left hand  . TONSILLECTOMY      Prior to Admission medications   Medication Sig Start Date End Date Taking? Authorizing Provider  acetaminophen (TYLENOL) 500 MG tablet Take 1,000 mg by mouth every 6 (six) hours as needed for headache.    Historical  Provider, MD  albuterol (PROVENTIL HFA;VENTOLIN HFA) 108 (90 Base) MCG/ACT inhaler Inhale 2 puffs into the lungs every 6 (six) hours as needed for wheezing or shortness of breath.    Historical Provider, MD  cephALEXin (KEFLEX) 500 MG capsule Take 1 capsule (500 mg total) by mouth 3 (three) times daily. Patient not taking: Reported on 03/10/2016 12/16/15   Waynetta Pean, PA-C  clindamycin (CLEOCIN) 150 MG capsule Take 2 capsules (300 mg total) by mouth 3 (three) times daily. May dispense as 150mg  capsules Patient not taking: Reported on 03/10/2016 02/13/16   Nona Dell, PA-C  Hyoscyamine Sulfate SL (LEVSIN/SL) 0.125 MG SUBL Place 0.125 mg under the tongue 3 (three) times daily as needed. 03/10/16   Charlann Lange, PA-C  ibuprofen (ADVIL,MOTRIN) 200 MG tablet Take 400 mg by mouth every 6 (six) hours as needed for moderate pain.    Historical Provider, MD  ibuprofen (ADVIL,MOTRIN) 600 MG tablet Take 1 tablet (600 mg total) by mouth every 6 (six) hours as needed. Patient not taking: Reported on 03/10/2016 02/13/16   Nona Dell, PA-C  naproxen (NAPROSYN) 250 MG tablet Take 1 tablet (250 mg total) by mouth 2 (two) times daily with a meal. 12/16/15   Waynetta Pean, PA-C  Norethindrone Acetate-Ethinyl Estrad-FE (LOESTRIN 24 FE) 1-20 MG-MCG(24) tablet Take 1 tablet by mouth daily. Patient taking differently: Take 1 tablet by mouth every evening.  08/04/15   Shelly Bombard,  MD  ondansetron (ZOFRAN) 4 MG tablet Take 1 tablet (4 mg total) by mouth every 6 (six) hours. 03/10/16   Charlann Lange, PA-C  phenytoin (DILANTIN) 100 MG ER capsule Take 1 capsule (100 mg total) by mouth 3 (three) times daily. 04/07/16 04/07/17  Lisa Roca, MD  Prenat-FeCbn-FeAspGl-FA-Omega (OB COMPLETE PETITE) 35-5-1-200 MG CAPS Take 1 capsule by mouth daily before breakfast. 01/29/15   Shelly Bombard, MD  promethazine (PHENERGAN) 25 MG suppository Place 1 suppository (25 mg total) rectally every 6 (six) hours as needed for  nausea. Patient not taking: Reported on 03/10/2016 12/12/15 12/11/16  Nance Pear, MD  promethazine (PHENERGAN) 6.25 MG/5ML syrup Take 20 mLs (25 mg total) by mouth 4 (four) times daily as needed for nausea or vomiting. 11/01/15 10/31/16  Drenda Freeze, MD  SUMAtriptan (IMITREX) 50 MG tablet Take 1 tablet (50 mg total) by mouth once as needed for migraine. May repeat in 2 hours if headache persists or recurs. Patient not taking: Reported on 03/10/2016 12/07/15 12/16/15  Laban Emperor, PA-C    Allergies  Allergen Reactions  . Milk-Related Compounds Anaphylaxis    Breaks face out *can have 2% milk*  . Mushroom Extract Complex Anaphylaxis  . Penicillins Anaphylaxis    Has patient had a PCN reaction causing immediate rash, facial/tongue/throat swelling, SOB or lightheadedness with hypotension: Yes Has patient had a PCN reaction causing severe rash involving mucus membranes or skin necrosis: No Has patient had a PCN reaction that required hospitalization No Has patient had a PCN reaction occurring within the last 10 years: Yes If all of the above answers are "NO", then may proceed with Cephalosporin use.   [name] Other (See Comments)    Breaks face out  . Latex Itching and Swelling  . Sulfa Antibiotics Swelling    Family History  Problem Relation Age of Onset  . Seizures Mother   . Diabetes Maternal Grandmother   . Colon cancer Maternal Grandmother   . Hypertension Maternal Grandmother   . Clotting disorder Maternal Grandmother     Social History Social History  Substance Use Topics  . Smoking status: Passive Smoke Exposure - Never Smoker  . Smokeless tobacco: Never Used  . Alcohol use No    Review of Systems  Constitutional: Negative for fever. Eyes: Negative for visual changes. ENT: Negative for sore throat. Cardiovascular: Negative for chest pain. Respiratory: Negative for shortness of breath. Gastrointestinal: Negative for abdominal pain, vomiting and  diarrhea. Genitourinary: Negative for dysuria. Musculoskeletal: Negative for back pain. Skin: Negative for rash. Neurological:Positive for mild headache. 10 point Review of Systems otherwise negative ____________________________________________   PHYSICAL EXAM:  VITAL SIGNS: ED Triage Vitals  Enc Vitals Group     BP 04/07/16 1100 117/80     Pulse Rate 04/07/16 1037 89     Resp 04/07/16 1037 20     Temp 04/07/16 1040 98.2 F (36.8 C)     Temp Source 04/07/16 1040 Oral     SpO2 04/07/16 1037 98 %     Weight 04/07/16 1032 (!) 303 lb (137.4 kg)     Height --      Head Circumference --      Peak Flow --      Pain Score 04/07/16 1030 10     Pain Loc --      Pain Edu? --      Excl. in [place]? --      Constitutional: Alert and Cooperative, oriented, but poor historian with respect to  the seizure itself. Well appearing and in no distress. HEENT   Head: Normocephalic and atraumatic appearance. Patient points to the side on her right forehead that sore, but there is no hematoma there.      Eyes: Conjunctivae are normal. PERRL. Normal extraocular movements.      Ears:         Nose: No congestion/rhinnorhea.   Mouth/Throat: Mucous membranes are moist.   Neck: No stridor.  Nontender C-spine. Cardiovascular/Chest: Normal rate, regular rhythm.  No murmurs, rubs, or gallops. Respiratory: Normal respiratory effort without tachypnea nor retractions. Breath sounds are clear and equal bilaterally. No wheezes/rales/rhonchi. Gastrointestinal: Soft. No distention, no guarding, no rebound. Nontender.  Obese  Genitourinary/rectal:Deferred Musculoskeletal: Nontender with normal range of motion in all extremities. No joint effusions.  No lower extremity tenderness.  No edema. Neurologic:  Normal speech and language. No gross or focal neurologic deficits are appreciated. Skin:  Skin is warm, dry and intact. No rash noted. Psychiatric: Mood and affect are normal. Speech and behavior are normal.  Patient exhibits appropriate insight and judgment.   ____________________________________________  LABS (pertinent positives/negatives)  Labs Reviewed  PHENYTOIN LEVEL, TOTAL - Abnormal; Notable for the following:       Result Value   Phenytoin Lvl <2.5 (*)    All other components within normal limits  BASIC METABOLIC PANEL - Abnormal; Notable for the following:    Calcium 8.8 (*)    All other components within normal limits  CBC  CBG MONITORING, ED    ____________________________________________    EKG I, Lisa Roca, MD, the attending physician have personally viewed and interpreted all ECGs.  None ____________________________________________  RADIOLOGY All Xrays were viewed by me. Imaging interpreted by Radiologist.  None __________________________________________  PROCEDURES  Procedure(s) performed: None  Critical Care performed: None  ____________________________________________   ED COURSE / ASSESSMENT AND PLAN  Pertinent labs & imaging results that were available during my care of the patient were reviewed by me and considered in my medical decision making (see chart for details).    It certainly sounds like by history that Ms. Goelz had a seizure, although I did not speak with any firsthand witnesses.  She is a poor historian to the events, but states that she has not been taking her Dilantin. Mom confirms this.  She seems a little slow to answer questions, I am not sure if this is related to being questioned about medication noncompliance, but she is using her phone and calling her friend and interacting with her mom.  She certainly seems to be coming back around just fine now. I discussed with mom that I don't have any significant concern about traumatic head injury, but that certainly if anything was not back to normal or worsening that would be something to consider. I'm not recommending head CT at this point in time.  She is okay and going to  be with her mom this afternoon in terms of by mouth Dilantin load.    CONSULTATIONS:  None   Patient / Family / Caregiver informed of clinical course, medical decision-making process, and agree with plan.   I discussed return precautions, follow-up instructions, and discharge instructions with patient and/or family.  Discharge instructions:  You were evaluated after seizure, and having been off of your Dilantin medication.  You are being given a loading schedule for today. You were given 400 mg today at about 1245. Please take 300 mg which is 3 of  your 100 mg tablets 2 hours later,  at 2:45pm today.  Please take an additional 300mg  (3 of your 100mg  tablets) at another 2 hours later, 4:45pm today.  Please restart your Dilantin dose 100 mg 3 times daily starting tomorrow at 5 PM.  Return to the ER for any new or worsening condition including seizure in the next 24 hours, seizure unusual for you, seizure lasting longer than 5 minutes, fever, confusion or altered mental status, or any other symptoms concerning to you.  No driving or operating machinery or bathing or swimming in water or any other activity that could be dangerous to yourself or others if you had a seizure, until released by your neurologist. ___________________________________________   FINAL CLINICAL IMPRESSION(S) / ED DIAGNOSES   Final diagnoses:  Seizure ([place])  Subtherapeutic serum dilantin level              Note: This dictation was prepared with Dragon dictation. Any transcriptional errors that result from this process are unintentional    Lisa Roca, MD 04/07/16 1241

## 2016-04-07 NOTE — Discharge Instructions (Signed)
You were evaluated after seizure, and having been off of your Dilantin medication.  You are being given a loading schedule for today. You were given 400 mg today at about 1245. Please take 300 mg which is 3 of  your 100 mg tablets 2 hours later, at 2:45pm today.  Please take an additional 300mg  (3 of your 100mg  tablets) at another 2 hours later, 4:45pm today.  Please restart your Dilantin dose 100 mg 3 times daily starting tomorrow at 5 PM.  Return to the ER for any new or worsening condition including seizure in the next 24 hours, seizure unusual for you, seizure lasting longer than 5 minutes, fever, confusion or altered mental status, or any other symptoms concerning to you.  No driving or operating machinery or bathing or swimming in water or any other activity that could be dangerous to yourself or others if you had a seizure, until released by your neurologist.

## 2016-07-23 ENCOUNTER — Emergency Department
Admission: EM | Admit: 2016-07-23 | Discharge: 2016-07-23 | Disposition: A | Discharge status: Home / Self Care | Payer: Medicaid Other | Attending: Emergency Medicine | Admitting: Emergency Medicine

## 2016-07-23 DIAGNOSIS — F909 Attention-deficit hyperactivity disorder, unspecified type: Secondary | ICD-10-CM | POA: Insufficient documentation

## 2016-07-23 DIAGNOSIS — Z91040 Latex allergy status: Secondary | ICD-10-CM | POA: Insufficient documentation

## 2016-07-23 DIAGNOSIS — Z7722 Contact with and (suspected) exposure to environmental tobacco smoke (acute) (chronic): Secondary | ICD-10-CM | POA: Diagnosis not present

## 2016-07-23 DIAGNOSIS — R569 Unspecified convulsions: Secondary | ICD-10-CM | POA: Insufficient documentation

## 2016-07-23 DIAGNOSIS — J45909 Unspecified asthma, uncomplicated: Secondary | ICD-10-CM | POA: Insufficient documentation

## 2016-07-23 DIAGNOSIS — Z79899 Other long term (current) drug therapy: Secondary | ICD-10-CM | POA: Diagnosis not present

## 2016-07-23 LAB — CBC WITH DIFFERENTIAL/PLATELET
BASOS ABS: 0 10*3/uL (ref 0–0.1)
BASOS PCT: 1 %
EOS ABS: 0.2 10*3/uL (ref 0–0.7)
Eosinophils Relative: 3 %
HCT: 37.8 % (ref 35.0–47.0)
HEMOGLOBIN: 12.9 g/dL (ref 12.0–16.0)
Lymphocytes Relative: 33 %
Lymphs Abs: 1.6 10*3/uL (ref 1.0–3.6)
MCH: 29.1 pg (ref 26.0–34.0)
MCHC: 34.2 g/dL (ref 32.0–36.0)
MCV: 85.2 fL (ref 80.0–100.0)
MONOS PCT: 8 %
Monocytes Absolute: 0.4 10*3/uL (ref 0.2–0.9)
NEUTROS PCT: 55 %
Neutro Abs: 2.7 10*3/uL (ref 1.4–6.5)
Platelets: 368 10*3/uL (ref 150–440)
RBC: 4.44 MIL/uL (ref 3.80–5.20)
RDW: 13.6 % (ref 11.5–14.5)
WBC: 4.9 10*3/uL (ref 3.6–11.0)

## 2016-07-23 LAB — PHENYTOIN LEVEL, TOTAL

## 2016-07-23 LAB — BASIC METABOLIC PANEL
Anion gap: 7 (ref 5–15)
BUN: 9 mg/dL (ref 6–20)
CALCIUM: 9 mg/dL (ref 8.9–10.3)
CHLORIDE: 104 mmol/L (ref 101–111)
CO2: 27 mmol/L (ref 22–32)
CREATININE: 0.64 mg/dL (ref 0.44–1.00)
GFR calc non Af Amer: >60 mL/min (ref >60)
Glucose, Bld: 97 mg/dL (ref 65–99)
Potassium: 3.9 mmol/L (ref 3.5–5.1)
SODIUM: 138 mmol/L (ref 135–145)

## 2016-07-23 LAB — HCG, QUANTITATIVE, PREGNANCY: HCG, BETA CHAIN, QUANT, S: 1 m[IU]/mL (ref <5)

## 2016-07-23 MED ORDER — IBUPROFEN 600 MG PO TABS
600.0000 mg | ORAL_TABLET | Freq: Once | ORAL | Status: AC
  Administered 2016-07-23: 600 mg via ORAL

## 2016-07-23 MED ORDER — LORAZEPAM 2 MG/ML IJ SOLN
INTRAMUSCULAR | Status: AC
  Administered 2016-07-23: 2 mg via INTRAVENOUS
  Filled 2016-07-23: qty 1

## 2016-07-23 MED ORDER — PHENYTOIN SODIUM EXTENDED 100 MG PO CAPS: 100.0000 mg | ORAL_CAPSULE | Freq: Three times a day (TID) | ORAL | 0 refills | Status: DC

## 2016-07-23 MED ORDER — SODIUM CHLORIDE 0.9 % IV SOLN
500.0000 mg | Freq: Once | INTRAVENOUS | Status: AC
  Administered 2016-07-23: 500 mg via INTRAVENOUS
  Filled 2016-07-23: qty 10

## 2016-07-23 MED ORDER — ACETAMINOPHEN 500 MG PO TABS
1000.0000 mg | ORAL_TABLET | Freq: Once | ORAL | Status: AC
  Administered 2016-07-23: 1000 mg via ORAL
  Filled 2016-07-23: qty 2

## 2016-07-23 MED ORDER — SODIUM CHLORIDE 0.9 % IV BOLUS (SEPSIS)
1000.0000 mL | Freq: Once | INTRAVENOUS | Status: AC
  Administered 2016-07-23: 1000 mL via INTRAVENOUS

## 2016-07-23 MED ORDER — LORAZEPAM 2 MG/ML IJ SOLN
2.0000 mg | Freq: Once | INTRAMUSCULAR | Status: AC
  Administered 2016-07-23: 2 mg via INTRAVENOUS

## 2016-07-23 MED ORDER — IBUPROFEN 600 MG PO TABS
ORAL_TABLET | ORAL | Status: AC
  Administered 2016-07-23: 600 mg via ORAL
  Filled 2016-07-23: qty 1

## 2016-07-23 NOTE — ED Triage Notes (Signed)
Per EMS pt was found on the floor at work suspected post seizure.  Pt has a history of seizures.  Pt is 3 mo pregnant and has not seen medical care yet for the pregnancy.  Pt is on Dilantin but has not been taking it recently.  Pt states head hurting and the light makes it worse.

## 2016-07-23 NOTE — Discharge Instructions (Signed)
As we discussed it is very important that you do not drive, go up on roofs, swim, or put yourself in any situation that might be dangerous for you or others if you were to have another seizure until you are cleared by a neurologist. Please seek medical attention for any high fevers, chest pain, shortness of breath, change in behavior, persistent vomiting, bloody stool or any other new or concerning symptoms.

## 2016-07-23 NOTE — ED Notes (Signed)
Reviewed d/c instructions, follow-up care, prescription with patient. Pt verbalized understanding.  

## 2016-07-23 NOTE — ED Notes (Signed)
Pt currently seizing

## 2016-07-23 NOTE — ED Provider Notes (Signed)
[facility] Emergency Department Provider Note   ____________________________________________   I have reviewed the triage vital signs and the nursing notes.   HISTORY  Chief Complaint Seizures   History limited by: Not Limited   HPI Latoya Stark is a 21 y.o. female who presents to the emergency department today via EMS after an apparent seizure. The patient was at work when she was found in the bathroom. The patient does not know exactly what happened but states she started to feel hot when she went to the bathroom. She currently has a headache which she states she normally gets after her seizures. Does state that she has not had her seizure medication for about two weeks. Additionally she states that she thinks she is roughly 3 months pregnant based on over the counter test she took.   Past Medical History:  Diagnosis Date  . ADHD (attention deficit hyperactivity disorder)   . Anemia   . Anxiety   . Asthma   . Depression   . Headache   . Obesity   . Seizures ([place])    "when I get too hot"  . Trichomonas infection   . UTI (lower urinary tract infection)     Patient Active Problem List   Diagnosis Date Noted  . Constipation 02/24/2014  . CN (constipation) 12/23/2013  . Gonorrhea in female 10/05/2013  . BV (bacterial vaginosis) 09/22/2013  . Candidiasis of vulva and vagina 09/22/2013    Past Surgical History:  Procedure Laterality Date  . ADENOIDECTOMY    . DILATION AND EVACUATION N/A 08/09/2013   Procedure: DILATATION AND EVACUATION;  Surgeon: Frederico Hamman, MD;  Location: [facility];  Service: Gynecology;  Laterality: N/A;  . ORTHOPEDIC SURGERY Left    left hand  . TONSILLECTOMY      Prior to Admission medications   Medication Sig Start Date End Date Taking? Authorizing Provider  acetaminophen (TYLENOL) 500 MG tablet Take 1,000 mg by mouth every 6 (six) hours as needed for headache.    [provider]  albuterol (PROVENTIL  HFA;VENTOLIN HFA) 108 (90 Base) MCG/ACT inhaler Inhale 2 puffs into the lungs every 6 (six) hours as needed for wheezing or shortness of breath.    [provider]  cephALEXin (KEFLEX) 500 MG capsule Take 1 capsule (500 mg total) by mouth 3 (three) times daily. Patient not taking: Reported on 03/10/2016 12/16/15   Waynetta Pean, PA-C  clindamycin (CLEOCIN) 150 MG capsule Take 2 capsules (300 mg total) by mouth 3 (three) times daily. May dispense as 150mg  capsules Patient not taking: Reported on 03/10/2016 02/13/16   Nona Dell, PA-C  Hyoscyamine Sulfate SL (LEVSIN/SL) 0.125 MG SUBL Place 0.125 mg under the tongue 3 (three) times daily as needed. 03/10/16   Charlann Lange, PA-C  ibuprofen (ADVIL,MOTRIN) 200 MG tablet Take 400 mg by mouth every 6 (six) hours as needed for moderate pain.    [provider]  ibuprofen (ADVIL,MOTRIN) 600 MG tablet Take 1 tablet (600 mg total) by mouth every 6 (six) hours as needed. Patient not taking: Reported on 03/10/2016 02/13/16   Nona Dell, PA-C  naproxen (NAPROSYN) 250 MG tablet Take 1 tablet (250 mg total) by mouth 2 (two) times daily with a meal. 12/16/15   Waynetta Pean, PA-C  Norethindrone Acetate-Ethinyl Estrad-FE (LOESTRIN 24 FE) 1-20 MG-MCG(24) tablet Take 1 tablet by mouth daily. Patient taking differently: Take 1 tablet by mouth every evening.  08/04/15   Shelly Bombard, MD  ondansetron (  ZOFRAN) 4 MG tablet Take 1 tablet (4 mg total) by mouth every 6 (six) hours. 03/10/16   Charlann Lange, PA-C  phenytoin (DILANTIN) 100 MG ER capsule Take 1 capsule (100 mg total) by mouth 3 (three) times daily. 04/07/16 04/07/17  Lisa Roca, MD  Prenat-FeCbn-FeAspGl-FA-Omega (OB COMPLETE PETITE) 35-5-1-200 MG CAPS Take 1 capsule by mouth daily before breakfast. 01/29/15   Shelly Bombard, MD  promethazine (PHENERGAN) 25 MG suppository Place 1 suppository (25 mg total) rectally every 6 (six) hours as needed for nausea. Patient not  taking: Reported on 03/10/2016 12/12/15 12/11/16  Nance Pear, MD  promethazine (PHENERGAN) 6.25 MG/5ML syrup Take 20 mLs (25 mg total) by mouth 4 (four) times daily as needed for nausea or vomiting. 11/01/15 10/31/16  Drenda Freeze, MD  SUMAtriptan (IMITREX) 50 MG tablet Take 1 tablet (50 mg total) by mouth once as needed for migraine. May repeat in 2 hours if headache persists or recurs. Patient not taking: Reported on 03/10/2016 12/07/15 12/16/15  Laban Emperor, PA-C    Allergies Milk-related compounds; Mushroom extract complex; Penicillins; Cheese; Latex; and Sulfa antibiotics  Family History  Problem Relation Age of Onset  . Seizures Mother   . Diabetes Maternal Grandmother   . Colon cancer Maternal Grandmother   . Hypertension Maternal Grandmother   . Clotting disorder Maternal Grandmother     Social History Social History  Substance Use Topics  . Smoking status: Passive Smoke Exposure - Never Smoker  . Smokeless tobacco: Never Used  . Alcohol use No    Review of Systems Constitutional: No fever/chills Eyes: No visual changes. ENT: No sore throat. Cardiovascular: Denies chest pain. Respiratory: Denies shortness of breath. Gastrointestinal: No abdominal pain.  No nausea, no vomiting.  No diarrhea.   Genitourinary: Negative for dysuria. Musculoskeletal: Negative for back pain. Skin: Negative for rash. Neurological: Positive for headache.   ____________________________________________   PHYSICAL EXAM:  VITAL SIGNS: ED Triage Vitals  Enc Vitals Group     BP 137/94     Pulse 87     Resp 13     Temp 98.4     Temp src      SpO2 100     Weight    Constitutional: Awake, alert. Slightly disoriented.  Eyes: Conjunctivae are normal.  ENT   Head: Normocephalic and atraumatic.   Nose: No congestion/rhinnorhea.   Mouth/Throat: Mucous membranes are moist.   Neck: No stridor. Hematological/Lymphatic/Immunilogical: No cervical  lymphadenopathy. Cardiovascular: Normal rate, regular rhythm.  No murmurs, rubs, or gallops.  Respiratory: Normal respiratory effort without tachypnea nor retractions. Breath sounds are clear and equal bilaterally. No wheezes/rales/rhonchi. Gastrointestinal: Soft and non tender. No rebound. No guarding.  Genitourinary: Deferred Musculoskeletal: Normal range of motion in all extremities. No lower extremity edema. Neurologic:  Awake, alert. Slightly disoriented. Moves all extremities.  Skin:  Skin is warm, dry and intact. No rash noted. Psychiatric: Mood and affect are normal. Speech and behavior are normal. Patient exhibits appropriate insight and judgment.  ____________________________________________    LABS (pertinent positives/negatives)  Labs Reviewed  PHENYTOIN LEVEL, TOTAL - Abnormal; Notable for the following:       Result Value   Phenytoin Lvl <2.5 (*)    All other components within normal limits  CBC WITH DIFFERENTIAL/PLATELET  BASIC METABOLIC PANEL  HCG, QUANTITATIVE, PREGNANCY     ____________________________________________   EKG  None  ____________________________________________    RADIOLOGY  None  ____________________________________________   PROCEDURES  Procedures  ____________________________________________   INITIAL IMPRESSION /  ASSESSMENT AND PLAN / ED COURSE  Pertinent labs & imaging results that were available during my care of the patient were reviewed by me and considered in my medical decision making (see chart for details).  Patient presents after seizure. History of same and has not been taking medication. Patient did have a seizure here shortly after arriving that broke on its own. She was given ativan and dilantin here. Patient was observed and did return to her baseline. Patient did have some weakness in her leg which she states happens after her seizures. Discussed with patient importance of neurology follow up. Discussed no  driving.   ____________________________________________   FINAL CLINICAL IMPRESSION(S) / ED DIAGNOSES  Final diagnoses:  Seizure [facility])     Note: This dictation was prepared with Dragon dictation. Any transcriptional errors that result from this process are unintentional     Nance Pear, MD 07/23/16 2332

## 2016-09-18 ENCOUNTER — Ambulatory Visit (INDEPENDENT_AMBULATORY_CARE_PROVIDER_SITE_OTHER): Payer: Medicaid Other

## 2016-09-18 DIAGNOSIS — R8290 Unspecified abnormal findings in urine: Secondary | ICD-10-CM

## 2016-09-18 DIAGNOSIS — N912 Amenorrhea, unspecified: Secondary | ICD-10-CM | POA: Diagnosis not present

## 2016-09-18 DIAGNOSIS — Z3202 Encounter for pregnancy test, result negative: Secondary | ICD-10-CM | POA: Diagnosis not present

## 2016-09-18 LAB — POCT URINE PREGNANCY: Preg Test, Ur: NEGATIVE

## 2016-09-18 NOTE — Progress Notes (Signed)
Pt here for a pregnancy test. Patient last LMP was 08/09/16. She states that she had a positive test on 08/30/16. Test today was negative. She has not had any spotting or bleeding.

## 2016-09-18 NOTE — Progress Notes (Signed)
Agree with nursing staff's documentation of this patient's clinic encounter.  Morene Crocker, CNM

## 2016-09-20 LAB — URINE CULTURE

## 2016-09-21 ENCOUNTER — Other Ambulatory Visit: Payer: Self-pay | Admitting: Obstetrics and Gynecology

## 2016-09-21 MED ORDER — CEPHALEXIN 500 MG PO CAPS: 500.0000 mg | ORAL_CAPSULE | Freq: Four times a day (QID) | ORAL | 0 refills | Status: DC

## 2016-09-22 ENCOUNTER — Telehealth: Payer: Self-pay | Admitting: *Deleted

## 2016-09-22 ENCOUNTER — Encounter: Payer: Self-pay | Admitting: *Deleted

## 2016-09-22 NOTE — Telephone Encounter (Signed)
-----   Message from Mora Bellman, MD sent at 09/21/2016 11:56 AM EDT ----- Please inform patient of UTI. Rx for keflex has been provided  [employer]

## 2016-09-22 NOTE — Telephone Encounter (Signed)
Attempted to call patient- voice mail is not set up. Letter composed and mailed to patient.

## 2016-09-27 ENCOUNTER — Ambulatory Visit: Payer: Self-pay | Admitting: Obstetrics

## 2016-10-18 ENCOUNTER — Ambulatory Visit: Payer: Self-pay | Admitting: Obstetrics

## 2016-10-23 ENCOUNTER — Emergency Department
Admission: EM | Admit: 2016-10-23 | Discharge: 2016-10-23 | Disposition: A | Discharge status: Home / Self Care | Payer: Medicaid Other | Attending: Emergency Medicine | Admitting: Emergency Medicine

## 2016-10-23 DIAGNOSIS — N611 Abscess of the breast and nipple: Secondary | ICD-10-CM | POA: Diagnosis present

## 2016-10-23 DIAGNOSIS — Z79899 Other long term (current) drug therapy: Secondary | ICD-10-CM | POA: Insufficient documentation

## 2016-10-23 DIAGNOSIS — Z7722 Contact with and (suspected) exposure to environmental tobacco smoke (acute) (chronic): Secondary | ICD-10-CM | POA: Insufficient documentation

## 2016-10-23 DIAGNOSIS — Z91040 Latex allergy status: Secondary | ICD-10-CM | POA: Diagnosis not present

## 2016-10-23 LAB — POCT PREGNANCY, URINE: PREG TEST UR: NEGATIVE

## 2016-10-23 MED ORDER — CLINDAMYCIN PHOSPHATE 600 MG/4ML IJ SOLN
600.0000 mg | Freq: Once | INTRAMUSCULAR | Status: AC
  Administered 2016-10-23: 600 mg via INTRAMUSCULAR
  Filled 2016-10-23: qty 4

## 2016-10-23 MED ORDER — HYDROCODONE-ACETAMINOPHEN 5-325 MG PO TABS
1.0000 | ORAL_TABLET | ORAL | Status: AC
  Administered 2016-10-23: 1 via ORAL
  Filled 2016-10-23: qty 1

## 2016-10-23 MED ORDER — HYDROCODONE-ACETAMINOPHEN 5-325 MG PO TABS: 1.0000 | ORAL_TABLET | Freq: Four times a day (QID) | ORAL | 0 refills | Status: DC | PRN

## 2016-10-23 MED ORDER — LIDOCAINE HCL (PF) 1 % IJ SOLN
2.0000 mL | Freq: Once | INTRAMUSCULAR | Status: AC
  Administered 2016-10-23: 2 mL
  Filled 2016-10-23: qty 5

## 2016-10-23 MED ORDER — CLINDAMYCIN HCL 150 MG PO CAPS: 300.0000 mg | ORAL_CAPSULE | Freq: Four times a day (QID) | ORAL | 0 refills | Status: AC

## 2016-10-23 MED ORDER — CLINDAMYCIN PHOSPHATE 300 MG/2ML IJ SOLN
INTRAMUSCULAR | Status: AC
  Administered 2016-10-23: 600 mg via INTRAMUSCULAR
  Filled 2016-10-23: qty 4

## 2016-10-23 NOTE — ED Notes (Signed)
Pt has abscess to left breast area.  Sx for 1 week.  States some drainage from area today.  Med given.

## 2016-10-23 NOTE — ED Provider Notes (Signed)
[place] EMERGENCY DEPARTMENT Provider Note   CSN: [id number] Arrival date & time: 10/23/16  1719     History   Chief Complaint Chief Complaint  Patient presents with  . Abscess    HPI Latoya Stark is a 21 y.o. female presents to the emergency department for evaluation of left breast abscess. Swelling and pain developed one week ago. She's had a similar episode one year ago that responded well to incision and drainage. She has not been taking any medications. Pain is moderate. No fevers. She has not had any drainage.  HPI  Past Medical History:  Diagnosis Date  . ADHD (attention deficit hyperactivity disorder)   . Anemia   . Anxiety   . Asthma   . Depression   . Headache   . Obesity   . Seizures ([place])    "when I get too hot"  . Trichomonas infection   . UTI (lower urinary tract infection)     Patient Active Problem List   Diagnosis Date Noted  . Constipation 02/24/2014  . CN (constipation) 12/23/2013  . Gonorrhea in female 10/05/2013  . BV (bacterial vaginosis) 09/22/2013  . Candidiasis of vulva and vagina 09/22/2013    Past Surgical History:  Procedure Laterality Date  . ADENOIDECTOMY    . DILATION AND EVACUATION N/A 08/09/2013   Procedure: DILATATION AND EVACUATION;  Surgeon: Frederico Hamman, MD;  Location: [facility];  Service: Gynecology;  Laterality: N/A;  . ORTHOPEDIC SURGERY Left    left hand  . TONSILLECTOMY      OB History    Gravida Para Term Preterm AB Living   2       2     SAB TAB Ectopic Multiple Live Births   2               Home Medications    Prior to Admission medications   Medication Sig Start Date End Date Taking? Authorizing Provider  acetaminophen (TYLENOL) 500 MG tablet Take 1,000 mg by mouth every 6 (six) hours as needed for headache.    [provider]  albuterol (PROVENTIL HFA;VENTOLIN HFA) 108 (90 Base) MCG/ACT inhaler Inhale 2 puffs into the lungs every 6 (six) hours as needed for  wheezing or shortness of breath.    [provider]  cephALEXin (KEFLEX) 500 MG capsule Take 1 capsule (500 mg total) by mouth 3 (three) times daily. Patient not taking: Reported on 03/10/2016 12/16/15   Waynetta Pean, PA-C  cephALEXin (KEFLEX) 500 MG capsule Take 1 capsule (500 mg total) by mouth 4 (four) times daily. 09/21/16   Constant, Peggy, MD  clindamycin (CLEOCIN) 150 MG capsule Take 2 capsules (300 mg total) by mouth 4 (four) times daily. 10/23/16 11/02/16  Duanne Guess, PA-C  HYDROcodone-acetaminophen (NORCO) 5-325 MG tablet Take 1 tablet by mouth every 6 (six) hours as needed for moderate pain. 10/23/16   Duanne Guess, PA-C  Hyoscyamine Sulfate SL (LEVSIN/SL) 0.125 MG SUBL Place 0.125 mg under the tongue 3 (three) times daily as needed. 03/10/16   Charlann Lange, PA-C  ibuprofen (ADVIL,MOTRIN) 200 MG tablet Take 400 mg by mouth every 6 (six) hours as needed for moderate pain.    [provider]  ibuprofen (ADVIL,MOTRIN) 600 MG tablet Take 1 tablet (600 mg total) by mouth every 6 (six) hours as needed. Patient not taking: Reported on 03/10/2016 02/13/16   Nona Dell, PA-C  naproxen (NAPROSYN) 250 MG tablet Take 1 tablet (250 mg total)  by mouth 2 (two) times daily with a meal. 12/16/15   Waynetta Pean, PA-C  Norethindrone Acetate-Ethinyl Estrad-FE (LOESTRIN 24 FE) 1-20 MG-MCG(24) tablet Take 1 tablet by mouth daily. Patient taking differently: Take 1 tablet by mouth every evening.  08/04/15   Shelly Bombard, MD  ondansetron (ZOFRAN) 4 MG tablet Take 1 tablet (4 mg total) by mouth every 6 (six) hours. 03/10/16   Charlann Lange, PA-C  phenytoin (DILANTIN) 100 MG ER capsule Take 1 capsule (100 mg total) by mouth 3 (three) times daily. 07/23/16 07/23/17  Nance Pear, MD  Prenat-FeCbn-FeAspGl-FA-Omega (OB COMPLETE PETITE) 35-5-1-200 MG CAPS Take 1 capsule by mouth daily before breakfast. 01/29/15   Shelly Bombard, MD  promethazine (PHENERGAN) 25 MG  suppository Place 1 suppository (25 mg total) rectally every 6 (six) hours as needed for nausea. Patient not taking: Reported on 03/10/2016 12/12/15 12/11/16  Nance Pear, MD  promethazine (PHENERGAN) 6.25 MG/5ML syrup Take 20 mLs (25 mg total) by mouth 4 (four) times daily as needed for nausea or vomiting. 11/01/15 10/31/16  Drenda Freeze, MD  SUMAtriptan (IMITREX) 50 MG tablet Take 1 tablet (50 mg total) by mouth once as needed for migraine. May repeat in 2 hours if headache persists or recurs. Patient not taking: Reported on 03/10/2016 12/07/15 12/16/15  Laban Emperor, PA-C    Family History Family History  Problem Relation Age of Onset  . Seizures Mother   . Diabetes Maternal Grandmother   . Colon cancer Maternal Grandmother   . Hypertension Maternal Grandmother   . Clotting disorder Maternal Grandmother     Social History Social History  Substance Use Topics  . Smoking status: Passive Smoke Exposure - Never Smoker  . Smokeless tobacco: Never Used  . Alcohol use No     Allergies   Milk-related compounds; Mushroom extract complex; Penicillins; Cheese; Latex; and Sulfa antibiotics   Review of Systems Review of Systems  Constitutional: Negative for fever.  Respiratory: Negative for shortness of breath.   Cardiovascular: Negative for chest pain.  Gastrointestinal: Negative for abdominal pain.  Genitourinary: Negative for difficulty urinating, dysuria and urgency.  Musculoskeletal: Negative for back pain and myalgias.  Skin: Positive for rash.  Neurological: Negative for dizziness and headaches.     Physical Exam Updated Vital Signs BP 112/85   Pulse 78   Temp 98.9 F (37.2 C) (Oral)   Resp 18   Ht 5\' 3"  (1.6 m)   Wt 99.8 kg (220 lb)   LMP 10/02/2016 (Approximate)   SpO2 98%   BMI 38.97 kg/m   Physical Exam  Constitutional: She is oriented to person, place, and time. She appears well-developed and well-nourished.  HENT:  Head: Normocephalic and  atraumatic.  Eyes: Conjunctivae are normal.  Neck: Normal range of motion.  Cardiovascular: Normal rate.   Pulmonary/Chest: Effort normal. No respiratory distress.  Musculoskeletal: Normal range of motion.  Neurological: She is alert and oriented to person, place, and time.  Skin: Skin is warm. No rash noted.  Left breast swelling at the base of the breastthe junction of the breast and sternum There is a 5 cm diameter area of induration with no sigficant fluctuance. Area is tender, red.  Psychiatric: She has a normal mood and affect. Her behavior is normal. Thought content normal.     ED Treatments / Results  Labs (all labs ordered are listed, but only abnormal results are displayed) Labs Reviewed  POC URINE PREG, ED  POCT PREGNANCY, URINE    EKG  EKG Interpretation None       Radiology No results found.  Procedures Procedures (including critical care time) INCISION AND DRAINAGE Performed by: Feliberto Gottron Consent: Verbal consent obtained. Risks and benefits: risks, benefits and alternatives were discussed Type: abscess  Body area: left breast  Anesthesia: local infiltration  Incision was made with a scalpel.  Local anesthetic: lidocaine 1% without epinephrine  Anesthetic total:  3 ml  Complexity: complex Blunt dissection to break up loculations  Drainage: purulent  Drainage amount: 5-10 mL purulent material  No iodoform packing  Patient tolerance: Patient tolerated the procedure well with no immediate complications.     Medications Ordered in ED Medications  clindamycin (CLEOCIN) injection 600 mg (not administered)  lidocaine (PF) (XYLOCAINE) 1 % injection 2 mL (2 mLs Infiltration Given 10/23/16 1831)  HYDROcodone-acetaminophen (NORCO/VICODIN) 5-325 MG per tablet 1 tablet (1 tablet Oral Given 10/23/16 1831)     Initial Impression / Assessment and Plan / ED Course  I have reviewed the triage vital signs and the nursing  notes.  Pertinent labs & imaging results that were available during my care of the patient were reviewed by me and considered in my medical decision making (see chart for details).     21 year old female with left breast abscess, incision and drainage is performed. Patient is afebrile. purulentmaterial was expressed from the abscess. She will continue with warm compresses. Small incision/puncture wound was made and adequate drainage was allowed. No iodoform packing was applied. Patient is placed on antibiotics. She is educated on signs and symptoms return to the ED for.  Final Clinical Impressions(s) / ED Diagnoses   Final diagnoses:  Breast abscess    New Prescriptions New Prescriptions   CLINDAMYCIN (CLEOCIN) 150 MG CAPSULE    Take 2 capsules (300 mg total) by mouth 4 (four) times daily.   HYDROCODONE-ACETAMINOPHEN (NORCO) 5-325 MG TABLET    Take 1 tablet by mouth every 6 (six) hours as needed for moderate pain.     Duanne Guess, PA-C 10/23/16 2001    Eula Listen, MD 10/23/16 2325

## 2016-10-23 NOTE — Discharge Instructions (Signed)
Please continue with warm compresses to her left breast. Take antibiotics as prescribed and follow-up in 2-3 days for any increasing pain swelling or redness. Return to the emergency department mainly for any fevers, worsening symptoms or changes in her health.

## 2016-10-23 NOTE — ED Triage Notes (Signed)
Cyst appearing area to inner left breast, has had one in this area before.

## 2016-10-23 NOTE — ED Notes (Signed)
Pt discharged to home.  Family member driving.  Discharge instructions reviewed.  Verbalized understanding.  No questions or concerns at this time.  Teach back verified.  Pt in NAD.  No items left in ED.   

## 2016-11-02 ENCOUNTER — Encounter: Payer: Self-pay | Admitting: Obstetrics

## 2016-11-02 ENCOUNTER — Ambulatory Visit (INDEPENDENT_AMBULATORY_CARE_PROVIDER_SITE_OTHER): Payer: Medicaid Other | Admitting: Obstetrics

## 2016-11-02 ENCOUNTER — Other Ambulatory Visit (HOSPITAL_COMMUNITY)
Admission: RE | Admit: 2016-11-02 | Discharge: 2016-11-02 | Disposition: A | Discharge status: Home / Self Care | Payer: Medicaid Other | Source: Ambulatory Visit | Attending: Obstetrics | Admitting: Obstetrics

## 2016-11-02 VITALS — BP 141/89 | HR 89 | Ht 63.0 in | Wt 311.6 lb

## 2016-11-02 DIAGNOSIS — Z6841 Body Mass Index (BMI) 40.0 and over, adult: Secondary | ICD-10-CM | POA: Insufficient documentation

## 2016-11-02 DIAGNOSIS — Z124 Encounter for screening for malignant neoplasm of cervix: Secondary | ICD-10-CM | POA: Diagnosis not present

## 2016-11-02 DIAGNOSIS — Z79899 Other long term (current) drug therapy: Secondary | ICD-10-CM | POA: Diagnosis not present

## 2016-11-02 DIAGNOSIS — R110 Nausea: Secondary | ICD-10-CM

## 2016-11-02 DIAGNOSIS — Z113 Encounter for screening for infections with a predominantly sexual mode of transmission: Secondary | ICD-10-CM

## 2016-11-02 DIAGNOSIS — Z01419 Encounter for gynecological examination (general) (routine) without abnormal findings: Secondary | ICD-10-CM

## 2016-11-02 DIAGNOSIS — Z87440 Personal history of urinary (tract) infections: Secondary | ICD-10-CM | POA: Diagnosis not present

## 2016-11-02 DIAGNOSIS — F909 Attention-deficit hyperactivity disorder, unspecified type: Secondary | ICD-10-CM | POA: Diagnosis not present

## 2016-11-02 DIAGNOSIS — Z91040 Latex allergy status: Secondary | ICD-10-CM | POA: Insufficient documentation

## 2016-11-02 DIAGNOSIS — F329 Major depressive disorder, single episode, unspecified: Secondary | ICD-10-CM | POA: Diagnosis not present

## 2016-11-02 DIAGNOSIS — Z882 Allergy status to sulfonamides status: Secondary | ICD-10-CM | POA: Diagnosis not present

## 2016-11-02 DIAGNOSIS — Z0000 Encounter for general adult medical examination without abnormal findings: Secondary | ICD-10-CM

## 2016-11-02 DIAGNOSIS — F419 Anxiety disorder, unspecified: Secondary | ICD-10-CM | POA: Diagnosis not present

## 2016-11-02 DIAGNOSIS — Z3169 Encounter for other general counseling and advice on procreation: Secondary | ICD-10-CM

## 2016-11-02 DIAGNOSIS — Z880 Allergy status to penicillin: Secondary | ICD-10-CM | POA: Diagnosis not present

## 2016-11-02 MED ORDER — OB COMPLETE PETITE 35-5-1-200 MG PO CAPS: 1.0000 | ORAL_CAPSULE | Freq: Every day | ORAL | 11 refills | Status: DC

## 2016-11-02 NOTE — Progress Notes (Signed)
Subjective:        Latoya Stark is a 21 y.o. female here for a routine exam.  Current complaints: None.    Personal health questionnaire:  Is patient [name], have a family history of breast and/or ovarian cancer: no Is there a family history of uterine cancer diagnosed at age < 67, gastrointestinal cancer, urinary tract cancer, family member who is a Field seismologist syndrome-associated carrier: no Is the patient overweight and hypertensive, family history of diabetes, personal history of gestational diabetes, preeclampsia or PCOS: no Is patient over 71, have PCOS,  family history of premature CHD under age 47, diabetes, smoke, have hypertension or peripheral artery disease:  no At any time, has a partner hit, kicked or otherwise hurt or frightened you?: no Over the past 2 weeks, have you felt down, depressed or hopeless?: no Over the past 2 weeks, have you felt little interest or pleasure in doing things?:no   Gynecologic History Patient's last menstrual period was 10/23/2016 (approximate). Contraception: none Last Pap: 2017. Results were: LGSIL Last mammogram: n/a. Results were: n/a  Obstetric History OB History  Gravida Para Term Preterm AB Living  2       2    SAB TAB Ectopic Multiple Live Births  2            # Outcome Date GA Lbr Len/2nd Weight Sex Delivery Anes PTL Lv  2 SAB 10/10/14          1 SAB              Birth Comments: System Generated. Please review and update pregnancy details.      Past Medical History:  Diagnosis Date  . ADHD (attention deficit hyperactivity disorder)   . Anemia   . Anxiety   . Asthma   . Depression   . Headache   . Obesity   . Seizures ([place])    "when I get too hot"  . Trichomonas infection   . UTI (lower urinary tract infection)     Past Surgical History:  Procedure Laterality Date  . ADENOIDECTOMY    . DILATION AND EVACUATION N/A 08/09/2013   Procedure: DILATATION AND EVACUATION;  Surgeon: Frederico Hamman, MD;   Location: [facility];  Service: Gynecology;  Laterality: N/A;  . ORTHOPEDIC SURGERY Left    left hand  . TONSILLECTOMY       Current Outpatient Prescriptions:  .  acetaminophen (TYLENOL) 500 MG tablet, Take 1,000 mg by mouth every 6 (six) hours as needed for headache., Disp: , Rfl:  .  albuterol (PROVENTIL HFA;VENTOLIN HFA) 108 (90 Base) MCG/ACT inhaler, Inhale 2 puffs into the lungs every 6 (six) hours as needed for wheezing or shortness of breath., Disp: , Rfl:  .  clindamycin (CLEOCIN) 150 MG capsule, Take 2 capsules (300 mg total) by mouth 4 (four) times daily., Disp: 80 capsule, Rfl: 0 .  HYDROcodone-acetaminophen (NORCO) 5-325 MG tablet, Take 1 tablet by mouth every 6 (six) hours as needed for moderate pain., Disp: 15 tablet, Rfl: 0 .  Hyoscyamine Sulfate SL (LEVSIN/SL) 0.125 MG SUBL, Place 0.125 mg under the tongue 3 (three) times daily as needed., Disp: 20 each, Rfl: 0 .  ibuprofen (ADVIL,MOTRIN) 200 MG tablet, Take 400 mg by mouth every 6 (six) hours as needed for moderate pain., Disp: , Rfl:  .  ondansetron (ZOFRAN) 4 MG tablet, Take 1 tablet (4 mg total) by mouth every 6 (six) hours., Disp: 12 tablet, Rfl: 0 .  phenytoin (  DILANTIN) 100 MG ER capsule, Take 1 capsule (100 mg total) by mouth 3 (three) times daily., Disp: 90 capsule, Rfl: 0 .  Prenat-FeCbn-FeAspGl-FA-Omega (OB COMPLETE PETITE) 35-5-1-200 MG CAPS, Take 1 capsule by mouth daily before breakfast., Disp: 30 capsule, Rfl: 11 .  promethazine (PHENERGAN) 25 MG suppository, Place 1 suppository (25 mg total) rectally every 6 (six) hours as needed for nausea., Disp: 12 suppository, Rfl: 1 Allergies  Allergen Reactions  . Milk-Related Compounds Anaphylaxis    Breaks face out *can have 2% milk*  . Mushroom Extract Complex Anaphylaxis  . Penicillins Anaphylaxis    Has patient had a PCN reaction causing immediate rash, facial/tongue/throat swelling, SOB or lightheadedness with hypotension: Yes Has patient had a PCN reaction causing  severe rash involving mucus membranes or skin necrosis: No Has patient had a PCN reaction that required hospitalization No Has patient had a PCN reaction occurring within the last 10 years: Yes If all of the above answers are "NO", then may proceed with Cephalosporin use.   [name] Other (See Comments)    Breaks face out  . Latex Itching and Swelling  . Sulfa Antibiotics Swelling    Social History  Substance Use Topics  . Smoking status: Passive Smoke Exposure - Never Smoker  . Smokeless tobacco: Never Used  . Alcohol use No    Family History  Problem Relation Age of Onset  . Seizures Mother   . Diabetes Maternal Grandmother   . Colon cancer Maternal Grandmother   . Hypertension Maternal Grandmother   . Clotting disorder Maternal Grandmother       Review of Systems  Constitutional: negative for fatigue and weight loss Respiratory: negative for cough and wheezing Cardiovascular: negative for chest pain, fatigue and palpitations Gastrointestinal: negative for abdominal pain and change in bowel habits Musculoskeletal:negative for myalgias Neurological: negative for gait problems and tremors Behavioral/Psych: negative for abusive relationship, depression Endocrine: negative for temperature intolerance    Genitourinary:negative for abnormal menstrual periods, genital lesions, hot flashes, sexual problems and vaginal discharge Integument/breast: negative for breast lump, breast tenderness, nipple discharge and skin lesion(s)    Objective:       BP (!) 141/89   Pulse 89   Ht 5\' 3"  (1.6 m)   Wt (!) 311 lb 9.6 oz (141.3 kg)   LMP 10/23/2016 (Approximate)   BMI 55.20 kg/m  General:   alert  Skin:   no rash or abnormalities  Lungs:   clear to auscultation bilaterally  Heart:   regular rate and rhythm, S1, S2 normal, no murmur, click, rub or gallop  Breasts:   normal without suspicious masses, skin or nipple changes or axillary nodes  Abdomen:  normal findings: no  organomegaly, soft, non-tender and no hernia  Pelvis:  External genitalia: normal general appearance Urinary system: urethral meatus normal and bladder without fullness, nontender Vaginal: normal without tenderness, induration or masses Cervix: normal appearance Adnexa: normal bimanual exam Uterus: anteverted and non-tender, normal size   Lab Review Urine pregnancy test Labs reviewed yes Radiologic studies reviewed no  50% of 20 min visit spent on counseling and coordination of care.    Assessment:     1. Encounter for routine gynecological examination with Papanicolaou smear of cervix Rx: - Cytology - PAP - Cervicovaginal ancillary only - Hepatitis B surface antigen - Hepatitis C antibody - HIV antibody - RPR - Prenat-FeCbn-FeAspGl-FA-Omega (OB COMPLETE PETITE) 35-5-1-200 MG CAPS; Take 1 capsule by mouth daily before breakfast.  Dispense: 30 capsule; Refill: 11  2. Class 3 severe obesity due to excess calories without serious comorbidity with body mass index (BMI) of 50.0 to 59.9 in adult [facility]) - program that includes caloric reduction exercise and behavioral modification recommended.   3. Nausea, episodic - managed by PCP  4. Routine adult health maintenance Rx: - Ambulatory referral to Internal Medicine  5. Encounter for preconception consultation - Folic acid Rx   Plan:    Education reviewed: calcium supplements, depression evaluation, low fat, low cholesterol diet, safe sex/STD prevention, self breast exams and weight bearing exercise. Contraception: none and wantsnto conceive. Follow up in: 1 year.   Meds ordered this encounter  Medications  . Prenat-FeCbn-FeAspGl-FA-Omega (OB COMPLETE PETITE) 35-5-1-200 MG CAPS    Sig: Take 1 capsule by mouth daily before breakfast.    Dispense:  30 capsule    Refill:  11   Orders Placed This Encounter  Procedures  . Hepatitis B surface antigen  . Hepatitis C antibody  . HIV antibody  . RPR  . Ambulatory referral to  Internal Medicine    Referral Priority:   Routine    Referral Type:   Consultation    Referral Reason:   Specialty Services Required    Requested Specialty:   Internal Medicine    Number of Visits Requested:   1

## 2016-11-03 LAB — CYTOLOGY - PAP: Diagnosis: NEGATIVE

## 2016-11-03 LAB — CERVICOVAGINAL ANCILLARY ONLY
Chlamydia: NEGATIVE
Neisseria Gonorrhea: NEGATIVE
Trichomonas: NEGATIVE

## 2016-12-13 ENCOUNTER — Other Ambulatory Visit: Payer: Self-pay

## 2016-12-13 ENCOUNTER — Emergency Department
Admission: EM | Admit: 2016-12-13 | Discharge: 2016-12-13 | Disposition: A | Discharge status: Home / Self Care | Payer: Medicaid Other | Attending: Student in an Organized Health Care Education/Training Program | Admitting: Student in an Organized Health Care Education/Training Program

## 2016-12-13 DIAGNOSIS — Z7722 Contact with and (suspected) exposure to environmental tobacco smoke (acute) (chronic): Secondary | ICD-10-CM | POA: Insufficient documentation

## 2016-12-13 DIAGNOSIS — N3000 Acute cystitis without hematuria: Secondary | ICD-10-CM

## 2016-12-13 DIAGNOSIS — Z91040 Latex allergy status: Secondary | ICD-10-CM | POA: Insufficient documentation

## 2016-12-13 DIAGNOSIS — J45909 Unspecified asthma, uncomplicated: Secondary | ICD-10-CM | POA: Insufficient documentation

## 2016-12-13 DIAGNOSIS — Z79899 Other long term (current) drug therapy: Secondary | ICD-10-CM | POA: Diagnosis not present

## 2016-12-13 DIAGNOSIS — R350 Frequency of micturition: Secondary | ICD-10-CM | POA: Diagnosis present

## 2016-12-13 LAB — URINALYSIS, COMPLETE (UACMP) WITH MICROSCOPIC
Bilirubin Urine: NEGATIVE
Glucose, UA: NEGATIVE mg/dL
Hgb urine dipstick: NEGATIVE
KETONES UR: NEGATIVE mg/dL
Leukocytes, UA: NEGATIVE
Nitrite: NEGATIVE
PH: 6 (ref 5.0–8.0)
Protein, ur: NEGATIVE mg/dL
SPECIFIC GRAVITY, URINE: 1.015 (ref 1.005–1.030)

## 2016-12-13 LAB — WET PREP, GENITAL
CLUE CELLS WET PREP: NONE SEEN
SPERM: NONE SEEN
TRICH WET PREP: NONE SEEN
Yeast Wet Prep HPF POC: NONE SEEN

## 2016-12-13 LAB — POCT PREGNANCY, URINE: Preg Test, Ur: NEGATIVE

## 2016-12-13 LAB — CHLAMYDIA/NGC RT PCR (ARMC ONLY)
Chlamydia Tr: NOT DETECTED
N gonorrhoeae: NOT DETECTED

## 2016-12-13 MED ORDER — NITROFURANTOIN MONOHYD MACRO 100 MG PO CAPS: 100.0000 mg | ORAL_CAPSULE | Freq: Two times a day (BID) | ORAL | 0 refills | Status: DC

## 2016-12-13 NOTE — ED Provider Notes (Signed)
[facility] Emergency Department Provider Note ____________________________________________  Time seen: 1523  I have reviewed the triage vital signs and the nursing notes.  HISTORY  Chief Complaint  Urinary Frequency  HPI Latoya Stark is a 21 y.o. female presents herself to the ED for evaluation of a 3-day complaint of urinary frequency, vaginal irritation and discharge.  Patient denies any fevers, chills, or sweats.  She reports being evaluated about 1 month prior for an STD check and reports that it was negative.  Denies any gross hematuria lesions, sores, or abnormal vaginal bleeding.  Past Medical History:  Diagnosis Date  . ADHD (attention deficit hyperactivity disorder)   . Anemia   . Anxiety   . Asthma   . Depression   . Headache   . Obesity   . Seizures ([place])    "when I get too hot"  . Trichomonas infection   . UTI (lower urinary tract infection)     Patient Active Problem List   Diagnosis Date Noted  . Constipation 02/24/2014  . CN (constipation) 12/23/2013  . Gonorrhea in female 10/05/2013  . BV (bacterial vaginosis) 09/22/2013  . Candidiasis of vulva and vagina 09/22/2013    Past Surgical History:  Procedure Laterality Date  . ADENOIDECTOMY    . DILATION AND EVACUATION N/A 08/09/2013   Procedure: DILATATION AND EVACUATION;  Surgeon: Frederico Hamman, MD;  Location: [facility];  Service: Gynecology;  Laterality: N/A;  . ORTHOPEDIC SURGERY Left    left hand  . TONSILLECTOMY      Prior to Admission medications   Medication Sig Start Date End Date Taking? Authorizing Provider  acetaminophen (TYLENOL) 500 MG tablet Take 1,000 mg by mouth every 6 (six) hours as needed for headache.    [provider]  albuterol (PROVENTIL HFA;VENTOLIN HFA) 108 (90 Base) MCG/ACT inhaler Inhale 2 puffs into the lungs every 6 (six) hours as needed for wheezing or shortness of breath.    [provider]  HYDROcodone-acetaminophen (NORCO)  5-325 MG tablet Take 1 tablet by mouth every 6 (six) hours as needed for moderate pain. 10/23/16   Duanne Guess, PA-C  Hyoscyamine Sulfate SL (LEVSIN/SL) 0.125 MG SUBL Place 0.125 mg under the tongue 3 (three) times daily as needed. 03/10/16   Charlann Lange, PA-C  ibuprofen (ADVIL,MOTRIN) 200 MG tablet Take 400 mg by mouth every 6 (six) hours as needed for moderate pain.    [provider]  nitrofurantoin, macrocrystal-monohydrate, (MACROBID) 100 MG capsule Take 1 capsule (100 mg total) by mouth 2 (two) times daily. 12/13/16   Margie Brink, Dannielle Karvonen, PA-C  ondansetron (ZOFRAN) 4 MG tablet Take 1 tablet (4 mg total) by mouth every 6 (six) hours. 03/10/16   Charlann Lange, PA-C  phenytoin (DILANTIN) 100 MG ER capsule Take 1 capsule (100 mg total) by mouth 3 (three) times daily. 07/23/16 07/23/17  Nance Pear, MD  Prenat-FeCbn-FeAspGl-FA-Omega (OB COMPLETE PETITE) 35-5-1-200 MG CAPS Take 1 capsule by mouth daily before breakfast. 11/02/16   Shelly Bombard, MD  promethazine (PHENERGAN) 25 MG suppository Place 1 suppository (25 mg total) rectally every 6 (six) hours as needed for nausea. 12/12/15 12/11/16  Nance Pear, MD    Allergies Milk-related compounds; Mushroom extract complex; Penicillins; Cheese; Latex; and Sulfa antibiotics  Family History  Problem Relation Age of Onset  . Seizures Mother   . Diabetes Maternal Grandmother   . Colon cancer Maternal Grandmother   . Hypertension Maternal Grandmother   . Clotting disorder Maternal Grandmother  Social History Social History   Tobacco Use  . Smoking status: Passive Smoke Exposure - Never Smoker  . Smokeless tobacco: Never Used  Substance Use Topics  . Alcohol use: No    Alcohol/week: 0.0 oz  . Drug use: No    Review of Systems  Constitutional: Negative for fever. Cardiovascular: Negative for chest pain. Respiratory: Negative for shortness of breath. Gastrointestinal: Negative for abdominal pain, vomiting and  diarrhea. Genitourinary: Positive for dysuria.  Vaginal discharge and irritation as above. Musculoskeletal: Negative for back pain. Skin: Negative for rash. Neurological: Negative for headaches, focal weakness or numbness. ____________________________________________  PHYSICAL EXAM:  VITAL SIGNS: ED Triage Vitals  Enc Vitals Group     BP 12/13/16 1438 112/84     Pulse Rate 12/13/16 1438 81     Resp 12/13/16 1438 18     Temp 12/13/16 1438 98.6 F (37 C)     Temp Source 12/13/16 1438 Oral     SpO2 12/13/16 1438 99 %     Weight 12/13/16 1439 295 lb (133.8 kg)     Height 12/13/16 1439 5\' 3"  (1.6 m)     Head Circumference --      Peak Flow --      Pain Score --      Pain Loc --      Pain Edu? --      Excl. in [place]? --     Constitutional: Alert and oriented. Well appearing and in no distress. Head: Normocephalic and atraumatic. Cardiovascular: Normal rate, regular rhythm. Normal distal pulses. Respiratory: Normal respiratory effort. No wheezes/rales/rhonchi. GU: Normal external genitalia. Scant, thin, white discharge in the vaginal canal. No CMT or adnexal masses.  Skin:  Skin is warm, dry and intact. No rash noted. ____________________________________________   LABS (pertinent positives/negatives)  Labs Reviewed  WET PREP, GENITAL - Abnormal; Notable for the following components:      Result Value   WBC, Wet Prep HPF POC FEW (*)    All other components within normal limits  URINALYSIS, COMPLETE (UACMP) WITH MICROSCOPIC - Abnormal; Notable for the following components:   Color, Urine YELLOW (*)    APPearance CLEAR (*)    Bacteria, UA RARE (*)    Squamous Epithelial / LPF 0-5 (*)    All other components within normal limits  CHLAMYDIA/NGC RT PCR ([facility] ONLY)  POC URINE PREG, ED  POCT PREGNANCY, URINE  ____________________________________________  INITIAL IMPRESSION / ASSESSMENT AND PLAN / ED COURSE  ED evaluation her exam is overall benign.  She had an STD  check along with her partner about 1 month prior.  She presents today with some urinary frequency and found to have some leukocyturia on exam.  Wet prep is negative showing only white blood cells.  GC is pending at time of discharge, patient will be notified via phone for any need to treat.  She is discharged with a prescription for Macrobid for treatment of her UTI. ____________________________________________  FINAL CLINICAL IMPRESSION(S) / ED DIAGNOSES  Final diagnoses:  Acute cystitis without hematuria      Carmie End, Dannielle Karvonen, PA-C 12/13/16 1700    Merlyn Lot, MD 12/13/16 1746

## 2016-12-13 NOTE — ED Triage Notes (Addendum)
Pt to ER via POv c/o urinary frequency, itching and discharge X 3 days. Pt had STD check X 1 month ago and reports "it was clean".  Nausea. Pt alert and oriented X4, active, cooperative, pt in NAD. RR even and unlabored, color WNL.

## 2016-12-13 NOTE — ED Notes (Signed)
Pt reports dysuria and vaginal itching.  No vag bleeding.   No abd pain   Pt reports frequent urination.  Pt alert.

## 2016-12-13 NOTE — Discharge Instructions (Signed)
Take the prescription antibiotic as directed. Drink plenty of non-carbonated drinks. Follow-up with your provider for continued symptoms.

## 2017-01-26 ENCOUNTER — Other Ambulatory Visit: Payer: Self-pay | Admitting: Obstetrics

## 2017-01-29 ENCOUNTER — Ambulatory Visit (INDEPENDENT_AMBULATORY_CARE_PROVIDER_SITE_OTHER): Payer: Medicaid Other | Admitting: Obstetrics

## 2017-01-29 ENCOUNTER — Other Ambulatory Visit (HOSPITAL_COMMUNITY)
Admission: RE | Admit: 2017-01-29 | Discharge: 2017-01-29 | Disposition: A | Discharge status: Home / Self Care | Payer: Medicaid Other | Source: Ambulatory Visit | Attending: Obstetrics | Admitting: Obstetrics

## 2017-01-29 ENCOUNTER — Encounter: Payer: Self-pay | Admitting: Obstetrics

## 2017-01-29 VITALS — BP 136/82 | HR 108 | Wt 314.2 lb

## 2017-01-29 DIAGNOSIS — N939 Abnormal uterine and vaginal bleeding, unspecified: Secondary | ICD-10-CM | POA: Diagnosis present

## 2017-01-29 DIAGNOSIS — N898 Other specified noninflammatory disorders of vagina: Secondary | ICD-10-CM | POA: Diagnosis not present

## 2017-01-29 DIAGNOSIS — Z6841 Body Mass Index (BMI) 40.0 and over, adult: Secondary | ICD-10-CM

## 2017-01-29 DIAGNOSIS — Z3201 Encounter for pregnancy test, result positive: Secondary | ICD-10-CM | POA: Diagnosis not present

## 2017-01-29 LAB — POCT URINE PREGNANCY: Preg Test, Ur: POSITIVE — AB

## 2017-01-29 MED ORDER — VITAFOL FE+ 90-1-200 & 50 MG PO CPPK: 2.0000 | ORAL_CAPSULE | Freq: Every day | ORAL | 11 refills | Status: DC

## 2017-01-29 NOTE — Progress Notes (Signed)
RGYN patient presents for problem visit   CC: Vaginal discharge x    Pt states she has been spotting and notices blood when wiping.

## 2017-01-29 NOTE — Progress Notes (Signed)
Patient ID: Latoya Stark, female   DOB: 09-26-1995, 22 y.o.   MRN: [id number]  Chief Complaint  Patient presents with  . Vaginal Discharge    HPI Latoya Stark is a 22 y.o. female.  Vaginal discharge and irregular vaginal spotting.  Denies pelvic pain or dysuria. HPI  Past Medical History:  Diagnosis Date  . ADHD (attention deficit hyperactivity disorder)   . Anemia   . Anxiety   . Asthma   . Depression   . Headache   . Obesity   . Seizures ([place])    "when I get too hot"  . Trichomonas infection   . UTI (lower urinary tract infection)     Past Surgical History:  Procedure Laterality Date  . ADENOIDECTOMY    . DILATION AND EVACUATION N/A 08/09/2013   Procedure: DILATATION AND EVACUATION;  Surgeon: Frederico Hamman, MD;  Location: [facility];  Service: Gynecology;  Laterality: N/A;  . ORTHOPEDIC SURGERY Left    left hand  . TONSILLECTOMY      Family History  Problem Relation Age of Onset  . Seizures Mother   . Diabetes Maternal Grandmother   . Colon cancer Maternal Grandmother   . Hypertension Maternal Grandmother   . Clotting disorder Maternal Grandmother     Social History Social History   Tobacco Use  . Smoking status: Passive Smoke Exposure - Never Smoker  . Smokeless tobacco: Never Used  Substance Use Topics  . Alcohol use: No    Alcohol/week: 0.0 oz  . Drug use: No    Allergies  Allergen Reactions  . Milk-Related Compounds Anaphylaxis    Breaks face out *can have 2% milk*  . Mushroom Extract Complex Anaphylaxis  . Penicillins Anaphylaxis    Has patient had a PCN reaction causing immediate rash, facial/tongue/throat swelling, SOB or lightheadedness with hypotension: Yes Has patient had a PCN reaction causing severe rash involving mucus membranes or skin necrosis: No Has patient had a PCN reaction that required hospitalization No Has patient had a PCN reaction occurring within the last 10 years: Yes If all of the above answers are "NO", then may  proceed with Cephalosporin use.   [name] Other (See Comments)    Breaks face out  . Latex Itching and Swelling  . Sulfa Antibiotics Swelling    Current Outpatient Medications  Medication Sig Dispense Refill  . albuterol (PROVENTIL HFA;VENTOLIN HFA) 108 (90 Base) MCG/ACT inhaler Inhale 2 puffs into the lungs every 6 (six) hours as needed for wheezing or shortness of breath.    . ondansetron (ZOFRAN) 4 MG tablet Take 1 tablet (4 mg total) by mouth every 6 (six) hours. 12 tablet 0  . phenytoin (DILANTIN) 100 MG ER capsule Take 1 capsule (100 mg total) by mouth 3 (three) times daily. 90 capsule 0  . Prenat-FeCbn-FeAspGl-FA-Omega (OB COMPLETE PETITE) 35-5-1-200 MG CAPS Take 1 capsule by mouth daily before breakfast. 30 capsule 11  . acetaminophen (TYLENOL) 500 MG tablet Take 1,000 mg by mouth every 6 (six) hours as needed for headache.    Marland Kitchen HYDROcodone-acetaminophen (NORCO) 5-325 MG tablet Take 1 tablet by mouth every 6 (six) hours as needed for moderate pain. (Patient not taking: Reported on 01/29/2017) 15 tablet 0  . Hyoscyamine Sulfate SL (LEVSIN/SL) 0.125 MG SUBL Place 0.125 mg under the tongue 3 (three) times daily as needed. 20 each 0  . ibuprofen (ADVIL,MOTRIN) 200 MG tablet Take 400 mg by mouth every 6 (six) hours as needed for moderate pain.    Marland Kitchen  metroNIDAZOLE (FLAGYL) 500 MG tablet TAKE 1 TABLET (500 MG TOTAL) BY MOUTH 2 (TWO) TIMES DAILY. (Patient not taking: Reported on 01/29/2017) 14 tablet 0  . nitrofurantoin, macrocrystal-monohydrate, (MACROBID) 100 MG capsule Take 1 capsule (100 mg total) by mouth 2 (two) times daily. (Patient not taking: Reported on 01/29/2017) 14 capsule 0  . Prenat-FePoly-Metf-FA-DHA-DSS (VITAFOL FE+) 90-1-200 & 50 MG CPPK Take 2 tablets by mouth daily before breakfast. 60 each 11  . promethazine (PHENERGAN) 25 MG suppository Place 1 suppository (25 mg total) rectally every 6 (six) hours as needed for nausea. 12 suppository 1   No current facility-administered  medications for this visit.     Review of Systems Review of Systems Constitutional: negative for fatigue and weight loss Respiratory: negative for cough and wheezing Cardiovascular: negative for chest pain, fatigue and palpitations Gastrointestinal: negative for abdominal pain and change in bowel habits Genitourinary:positive for vaginal discharge and irregular vaginal spotting Integument/breast: negative for nipple discharge Musculoskeletal:negative for myalgias Neurological: negative for gait problems and tremors Behavioral/Psych: negative for abusive relationship, depression Endocrine: negative for temperature intolerance      Blood pressure 136/82, pulse (!) 108, weight (!) 314 lb 3.2 oz (142.5 kg), last menstrual period 01/08/2017.  Physical Exam Physical Exam General:   alert  Skin:   no rash or abnormalities  Lungs:   clear to auscultation bilaterally  Heart:   regular rate and rhythm, S1, S2 normal, no murmur, click, rub or gallop  Breasts:   normal without suspicious masses, skin or nipple changes or axillary nodes  Abdomen:  normal findings: no organomegaly, soft, non-tender and no hernia  Pelvis:  External genitalia: normal general appearance Urinary system: urethral meatus normal and bladder without fullness, nontender Vaginal: normal without tenderness, induration or masses Cervix: normal appearance Adnexa: normal bimanual exam Uterus: anteverted and non-tender, normal size    50% of 15 min visit spent on counseling and coordination of care.   Data Reviewed UPT Wet Prep  Assessment     1. Vaginal discharge Rx: - Cervicovaginal ancillary only  2. Vaginal spotting Rx: - POCT urine pregnancy  3. Pregnancy test positive Rx: - Prenat-FePoly-Metf-FA-DHA-DSS (VITAFOL FE+) 90-1-200 & 50 MG CPPK; Take 2 tablets by mouth daily before breakfast.  Dispense: 60 each; Refill: 11  4. Abnormal uterine bleeding (AUB) - probably related to early pregnancy  5. Class  3 severe obesity due to excess calories without serious comorbidity with body mass index (BMI) of 50.0 to 59.9 in adult [facility])     Plan    Follow up in 6 weeks for NOB visit   Orders Placed This Encounter  Procedures  . POCT urine pregnancy   Meds ordered this encounter  Medications  . Prenat-FePoly-Metf-FA-DHA-DSS (VITAFOL FE+) 90-1-200 & 50 MG CPPK    Sig: Take 2 tablets by mouth daily before breakfast.    Dispense:  60 each    Refill:  11    Shelly Bombard MD

## 2017-01-30 LAB — CERVICOVAGINAL ANCILLARY ONLY
BACTERIAL VAGINITIS: POSITIVE — AB
CANDIDA VAGINITIS: POSITIVE — AB
Chlamydia: NEGATIVE
NEISSERIA GONORRHEA: NEGATIVE
Trichomonas: NEGATIVE

## 2017-01-31 ENCOUNTER — Telehealth: Payer: Self-pay

## 2017-01-31 ENCOUNTER — Other Ambulatory Visit: Payer: Self-pay | Admitting: Obstetrics

## 2017-01-31 DIAGNOSIS — B9689 Other specified bacterial agents as the cause of diseases classified elsewhere: Secondary | ICD-10-CM

## 2017-01-31 DIAGNOSIS — N760 Acute vaginitis: Principal | ICD-10-CM

## 2017-01-31 DIAGNOSIS — B379 Candidiasis, unspecified: Secondary | ICD-10-CM

## 2017-01-31 MED ORDER — SECNIDAZOLE 2 G PO PACK: 1.0000 | PACK | Freq: Once | ORAL | 2 refills | Status: AC

## 2017-01-31 MED ORDER — FLUCONAZOLE 150 MG PO TABS
150.0000 mg | ORAL_TABLET | Freq: Once | ORAL | 0 refills | Status: AC
Start: 2017-01-31 — End: 2017-01-31

## 2017-01-31 NOTE — Telephone Encounter (Signed)
TC to pt regarding pt complaints. I am not able to get through at all I have called 4x and I am getting a message that she has a VM that has not been set up.  [facility] CMA

## 2017-01-31 NOTE — Telephone Encounter (Signed)
TC to pt c/o: stomach pain,(stabbing, burning), nausea and vomiting. Rx Phenergan is not giving her any relief.  Pt denies any vaginal bleeding, denies any diarrhea  Pt states she had a bite of noodles and water. Pt advised to try B.R.A.T diet, increase water intake, advised she will need to eat 5-6 small meals and drink at least 8 glasses of water.  And to try ginger ale.  Pt was advised to monitor sx's after eating and increasing water intake if worsens to contact the office/ go to [facility].

## 2017-02-01 ENCOUNTER — Telehealth: Payer: Self-pay | Admitting: *Deleted

## 2017-02-01 NOTE — Telephone Encounter (Signed)
Talked with pt regarding nausea Rx. Pt states that Phenergan is not helping with her nausea and would like to know if there is something else that can be prescribed or if she should try OTC meds. Pt would prefer a liquid as pills are hard to take. Pt made aware it may be difficult to get liquid meds.   Please advise and/or send new Rx to pharmacy.

## 2017-02-01 NOTE — Telephone Encounter (Signed)
I don't know of liquid med for nausea.

## 2017-02-05 NOTE — Telephone Encounter (Signed)
OK for Diclegis

## 2017-02-05 NOTE — Telephone Encounter (Signed)
Would you like for me to send her in some Diclegis and I will let her know there are no liquid options for nausea meds?

## 2017-02-08 ENCOUNTER — Telehealth: Payer: Self-pay | Admitting: Pediatrics

## 2017-02-08 ENCOUNTER — Other Ambulatory Visit: Payer: Self-pay | Admitting: *Deleted

## 2017-02-08 MED ORDER — DOXYLAMINE-PYRIDOXINE 10-10 MG PO TBEC: DELAYED_RELEASE_TABLET | ORAL | 5 refills | Status: DC

## 2017-02-08 NOTE — Telephone Encounter (Signed)
PA approval# [id number]

## 2017-02-08 NOTE — Progress Notes (Signed)
Diclegis sent in for pt per Dr Jodi Mourning order.  Attempt to contact pt, no answer- VM not set up.

## 2017-02-08 NOTE — Telephone Encounter (Signed)
Diclegis sent. Attempt to contact pt. No answer, VM not set up.

## 2017-02-21 ENCOUNTER — Telehealth: Payer: Self-pay

## 2017-02-21 NOTE — Telephone Encounter (Signed)
Returned call and pt wanted to know what meds she could take for nasal congestion, advised of safe otc meds during pregnancy. Pt also stated that she has been feeling hot and having sweats...advised pt to go to hospital for further evaluation.

## 2017-03-19 ENCOUNTER — Encounter: Payer: Self-pay | Admitting: Obstetrics

## 2017-03-19 ENCOUNTER — Other Ambulatory Visit (HOSPITAL_COMMUNITY)
Admission: RE | Admit: 2017-03-19 | Discharge: 2017-03-19 | Disposition: A | Discharge status: Home / Self Care | Payer: Medicaid Other | Source: Ambulatory Visit | Attending: Obstetrics | Admitting: Obstetrics

## 2017-03-19 ENCOUNTER — Ambulatory Visit (INDEPENDENT_AMBULATORY_CARE_PROVIDER_SITE_OTHER): Payer: Medicaid Other | Admitting: Obstetrics

## 2017-03-19 VITALS — BP 126/84 | Wt 314.0 lb

## 2017-03-19 DIAGNOSIS — Z3490 Encounter for supervision of normal pregnancy, unspecified, unspecified trimester: Secondary | ICD-10-CM | POA: Insufficient documentation

## 2017-03-19 DIAGNOSIS — N760 Acute vaginitis: Secondary | ICD-10-CM | POA: Diagnosis not present

## 2017-03-19 DIAGNOSIS — Z6841 Body Mass Index (BMI) 40.0 and over, adult: Secondary | ICD-10-CM

## 2017-03-19 DIAGNOSIS — O219 Vomiting of pregnancy, unspecified: Secondary | ICD-10-CM | POA: Diagnosis not present

## 2017-03-19 DIAGNOSIS — O23599 Infection of other part of genital tract in pregnancy, unspecified trimester: Secondary | ICD-10-CM | POA: Insufficient documentation

## 2017-03-19 DIAGNOSIS — B9689 Other specified bacterial agents as the cause of diseases classified elsewhere: Secondary | ICD-10-CM | POA: Insufficient documentation

## 2017-03-19 DIAGNOSIS — B373 Candidiasis of vulva and vagina: Secondary | ICD-10-CM | POA: Diagnosis not present

## 2017-03-19 DIAGNOSIS — Z3A00 Weeks of gestation of pregnancy not specified: Secondary | ICD-10-CM | POA: Diagnosis not present

## 2017-03-19 DIAGNOSIS — O0990 Supervision of high risk pregnancy, unspecified, unspecified trimester: Secondary | ICD-10-CM | POA: Insufficient documentation

## 2017-03-19 DIAGNOSIS — Z23 Encounter for immunization: Secondary | ICD-10-CM

## 2017-03-19 DIAGNOSIS — Z3687 Encounter for antenatal screening for uncertain dates: Secondary | ICD-10-CM | POA: Diagnosis not present

## 2017-03-19 DIAGNOSIS — O3680X0 Pregnancy with inconclusive fetal viability, not applicable or unspecified: Secondary | ICD-10-CM | POA: Diagnosis not present

## 2017-03-19 DIAGNOSIS — O0991 Supervision of high risk pregnancy, unspecified, first trimester: Secondary | ICD-10-CM

## 2017-03-19 MED ORDER — PROMETHAZINE HCL 25 MG PO TABS: 25.0000 mg | ORAL_TABLET | Freq: Four times a day (QID) | ORAL | 2 refills | Status: DC | PRN

## 2017-03-19 NOTE — Progress Notes (Signed)
Pregnancy was not planned FOB very supportive and present today.  Pt wants genetic testing.   Pt needs inhaler   Had pap 11/02/2016

## 2017-03-19 NOTE — Progress Notes (Signed)
Subjective:    Latoya Stark is being seen today for her first obstetrical visit.  This is a planned pregnancy. She is at [redacted]w[redacted]d gestation. Her obstetrical history is significant for obesity. Relationship with FOB: significant other, not living together. Patient does intend to breast feed. Pregnancy history fully reviewed.  The information documented in the HPI was reviewed and verified.  Menstrual History: OB History    Gravida Para Term Preterm AB Living   3       2     SAB TAB Ectopic Multiple Live Births   2               Patient's last menstrual period was 01/08/2017 (approximate).    Past Medical History:  Diagnosis Date  . ADHD (attention deficit hyperactivity disorder)   . Anemia   . Anxiety   . Asthma   . Depression   . Headache   . Obesity   . Seizures ([place])    "when I get too hot"  . Trichomonas infection   . UTI (lower urinary tract infection)     Past Surgical History:  Procedure Laterality Date  . ADENOIDECTOMY    . DILATION AND EVACUATION N/A 08/09/2013   Procedure: DILATATION AND EVACUATION;  Surgeon: Frederico Hamman, MD;  Location: [facility];  Service: Gynecology;  Laterality: N/A;  . ORTHOPEDIC SURGERY Left    left hand  . TONSILLECTOMY       (Not in a hospital admission) Allergies  Allergen Reactions  . Milk-Related Compounds Anaphylaxis    Breaks face out *can have 2% milk*  . Mushroom Extract Complex Anaphylaxis  . Penicillins Anaphylaxis    Has patient had a PCN reaction causing immediate rash, facial/tongue/throat swelling, SOB or lightheadedness with hypotension: Yes Has patient had a PCN reaction causing severe rash involving mucus membranes or skin necrosis: No Has patient had a PCN reaction that required hospitalization No Has patient had a PCN reaction occurring within the last 10 years: Yes If all of the above answers are "NO", then may proceed with Cephalosporin use.   [name] Other (See Comments)    Breaks face out  . Latex  Itching and Swelling  . Sulfa Antibiotics Swelling    Social History   Tobacco Use  . Smoking status: Passive Smoke Exposure - Never Smoker  . Smokeless tobacco: Never Used  Substance Use Topics  . Alcohol use: No    Alcohol/week: 0.0 oz    Family History  Problem Relation Age of Onset  . Seizures Mother   . Diabetes Maternal Grandmother   . Colon cancer Maternal Grandmother   . Hypertension Maternal Grandmother   . Clotting disorder Maternal Grandmother   . Breast cancer Maternal Grandmother      Review of Systems Constitutional: negative for weight loss Gastrointestinal: negative for vomiting Genitourinary:negative for genital lesions and vaginal discharge and dysuria Musculoskeletal:negative for back pain Behavioral/Psych: negative for abusive relationship, depression, illegal drug usage and tobacco use     Objective:    Wt (!) 314 lb (142.4 kg)   LMP 01/08/2017 (Approximate)   BMI 55.62 kg/m  General Appearance:    Alert, cooperative, no distress, appears stated age  Head:    Normocephalic, without obvious abnormality, atraumatic  Eyes:    PERRL, conjunctiva/corneas clear, EOM's intact, fundi    benign, both eyes  Ears:    Normal TM's and external ear canals, both ears  Nose:   Nares normal, septum midline, mucosa normal, no  drainage    or sinus tenderness  Throat:   Lips, mucosa, and tongue normal; teeth and gums normal  Neck:   Supple, symmetrical, trachea midline, no adenopathy;    thyroid:  no enlargement/tenderness/nodules; no carotid   bruit or JVD  Back:     Symmetric, no curvature, ROM normal, no CVA tenderness  Lungs:     Clear to auscultation bilaterally, respirations unlabored  Chest Wall:    No tenderness or deformity   Heart:    Regular rate and rhythm, S1 and S2 normal, no murmur, rub   or gallop  Breast Exam:    No tenderness, masses, or nipple abnormality  Abdomen:     Soft, non-tender, bowel sounds active all four quadrants,    no masses, no  organomegaly  Genitalia:    Normal female without lesion, discharge or tenderness  Extremities:   Extremities normal, atraumatic, no cyanosis or edema  Pulses:   2+ and symmetric all extremities  Skin:   Skin color, texture, turgor normal, no rashes or lesions  Lymph nodes:   Cervical, supraclavicular, and axillary nodes normal  Neurologic:   CNII-XII intact, normal strength, sensation and reflexes    throughout      Lab Review Urine pregnancy test Labs reviewed yes Radiologic studies reviewed no   Assessment:    Pregnancy at [redacted]w[redacted]d weeks    1. Supervision of high risk pregnancy, antepartum Rx: - Culture, OB Urine - Obstetric Panel, Including HIV - Genetic Screening - Hemoglobinopathy evaluation - Cystic Fibrosis Mutation 97 - Cervicovaginal ancillary only  2. Pregnancy with uncertain fetal viability, single or unspecified fetus Rx: - US OB Limited; Future - US OB Transvaginal; Future  3. Unsure of LMP (last menstrual period) as reason for ultrasound scan Rx: - US OB Limited; Future - US OB Transvaginal; Future  4. Nausea and vomiting during pregnancy Rx: - promethazine (PHENERGAN) 25 MG tablet; Take 1 tablet (25 mg total) by mouth every 6 (six) hours as needed for nausea or vomiting.  Dispense: 30 tablet; Refill: 2  5. Class 3 severe obesity due to excess calories without serious comorbidity with body mass index (BMI) of 50.0 to 59.9 in adult ([place])   6. Need for immunization against influenza Rx: - Flu Vaccine QUAD 36+ mos IM   Plan:   Prenatal vitamins.  Counseling provided regarding continued use of seat belts, cessation of alcohol consumption, smoking or use of illicit drugs; infection precautions i.e., influenza/TDAP immunizations, toxoplasmosis,CMV, parvovirus, listeria and varicella; workplace safety, exercise during pregnancy; routine dental care, safe medications, sexual activity, hot tubs, saunas, pools, travel, caffeine use, fish and methlymercury,  potential toxins, hair treatments, varicose veins Weight gain recommendations per IOM guidelines reviewed: underweight/BMI< 18.5--> gain 28 - 40 lbs; normal weight/BMI 18.5 - 24.9--> gain 25 - 35 lbs; overweight/BMI 25 - 29.9--> gain 15 - 25 lbs; obese/BMI >30->gain  11 - 20 lbs Problem list reviewed and updated. FIRST/CF mutation testing/NIPT/QUAD SCREEN/fragile X/[name] population testing/Spinal muscular atrophy discussed: requested. Role of ultrasound in pregnancy discussed; fetal survey: requested. Amniocentesis discussed: not indicated.   No orders of the defined types were placed in this encounter.  Orders Placed This Encounter  Procedures  . Culture, OB Urine  . US OB Limited    Standing Status:   Future    Standing Expiration Date:   05/20/2018    Order Specific Question:   Reason for Exam (SYMPTOM  OR DIAGNOSIS REQUIRED)    Answer:   Viabilkity.  Unsure  LMP.    Order Specific Question:   Preferred Imaging Location?    Answer:   [facility]  . US OB Transvaginal    Standing Status:   Future    Standing Expiration Date:   05/20/2018    Order Specific Question:   Reason for Exam (SYMPTOM  OR DIAGNOSIS REQUIRED)    Answer:   Viability.  Unsure LMP.    Order Specific Question:   Preferred Imaging Location?    Answer:   [facility]  . Flu Vaccine QUAD 36+ mos IM  . Obstetric Panel, Including HIV  . Genetic Screening    [facility]  . Hemoglobinopathy evaluation  . Cystic Fibrosis Mutation 97    Follow up in 4 weeks. 50% of 20 min visit spent on counseling and coordination of care.     Shelly Bombard MD

## 2017-03-20 LAB — CERVICOVAGINAL ANCILLARY ONLY
Bacterial vaginitis: POSITIVE — AB
Candida vaginitis: POSITIVE — AB
Chlamydia: NEGATIVE
NEISSERIA GONORRHEA: NEGATIVE
TRICH (WINDOWPATH): NEGATIVE

## 2017-03-21 ENCOUNTER — Other Ambulatory Visit: Payer: Self-pay | Admitting: *Deleted

## 2017-03-21 ENCOUNTER — Other Ambulatory Visit: Payer: Self-pay | Admitting: Obstetrics

## 2017-03-21 DIAGNOSIS — B3731 Acute candidiasis of vulva and vagina: Secondary | ICD-10-CM

## 2017-03-21 DIAGNOSIS — B373 Candidiasis of vulva and vagina: Secondary | ICD-10-CM

## 2017-03-21 DIAGNOSIS — B9689 Other specified bacterial agents as the cause of diseases classified elsewhere: Secondary | ICD-10-CM

## 2017-03-21 DIAGNOSIS — N760 Acute vaginitis: Principal | ICD-10-CM

## 2017-03-21 MED ORDER — METRONIDAZOLE 500 MG PO TABS: ORAL_TABLET | ORAL | 0 refills | Status: DC

## 2017-03-21 MED ORDER — TERCONAZOLE 0.4 % VA CREA: 1.0000 | TOPICAL_CREAM | Freq: Every day | VAGINAL | 0 refills | Status: DC

## 2017-03-21 NOTE — Progress Notes (Signed)
terazol sent for +yeast

## 2017-03-22 LAB — URINE CULTURE, OB REFLEX

## 2017-03-22 LAB — CULTURE, OB URINE

## 2017-03-22 NOTE — Addendum Note (Signed)
Addended by: Marquette Old on: 03/22/2017 04:00 PM   Modules accepted: Orders

## 2017-03-23 ENCOUNTER — Telehealth: Payer: Self-pay | Admitting: Pediatrics

## 2017-03-23 ENCOUNTER — Ambulatory Visit (HOSPITAL_COMMUNITY)
Admission: RE | Admit: 2017-03-23 | Discharge: 2017-03-23 | Disposition: A | Discharge status: Home / Self Care | Payer: Medicaid Other | Source: Ambulatory Visit | Attending: Obstetrics | Admitting: Obstetrics

## 2017-03-23 ENCOUNTER — Other Ambulatory Visit: Payer: Self-pay | Admitting: Obstetrics

## 2017-03-23 DIAGNOSIS — Z3A01 Less than 8 weeks gestation of pregnancy: Secondary | ICD-10-CM | POA: Insufficient documentation

## 2017-03-23 DIAGNOSIS — O3680X0 Pregnancy with inconclusive fetal viability, not applicable or unspecified: Secondary | ICD-10-CM

## 2017-03-23 DIAGNOSIS — O99211 Obesity complicating pregnancy, first trimester: Secondary | ICD-10-CM | POA: Insufficient documentation

## 2017-03-23 DIAGNOSIS — Z3687 Encounter for antenatal screening for uncertain dates: Secondary | ICD-10-CM

## 2017-03-23 DIAGNOSIS — N3000 Acute cystitis without hematuria: Secondary | ICD-10-CM

## 2017-03-23 MED ORDER — NITROFURANTOIN MONOHYD MACRO 100 MG PO CAPS: 100.0000 mg | ORAL_CAPSULE | Freq: Two times a day (BID) | ORAL | 0 refills | Status: DC

## 2017-03-26 ENCOUNTER — Telehealth: Payer: Self-pay

## 2017-03-26 NOTE — Telephone Encounter (Signed)
TC from patient regarding U/S results on 03/23/17. Pt next ROB appt is not until 04/16/17 Please advise.

## 2017-03-27 ENCOUNTER — Telehealth: Payer: Self-pay

## 2017-03-27 ENCOUNTER — Other Ambulatory Visit: Payer: Self-pay | Admitting: Certified Nurse Midwife

## 2017-03-27 NOTE — Telephone Encounter (Signed)
TC  To pt to make aware pt needs HCG ok per [facility] to let pt know she needs lab appt And f/u U/S in 14 days.

## 2017-03-27 NOTE — Telephone Encounter (Signed)
TC from pt again requesting U/S results  Please advise.   Pt had U/S on 03-23-17

## 2017-03-28 ENCOUNTER — Other Ambulatory Visit: Payer: Self-pay | Admitting: Obstetrics

## 2017-03-28 ENCOUNTER — Other Ambulatory Visit: Payer: Self-pay | Admitting: Certified Nurse Midwife

## 2017-03-28 ENCOUNTER — Other Ambulatory Visit: Payer: Medicaid Other

## 2017-03-28 DIAGNOSIS — Z3490 Encounter for supervision of normal pregnancy, unspecified, unspecified trimester: Secondary | ICD-10-CM

## 2017-03-28 LAB — OBSTETRIC PANEL, INCLUDING HIV
Antibody Screen: NEGATIVE
BASOS: 0 %
Basophils Absolute: 0 10*3/uL (ref 0.0–0.2)
EOS (ABSOLUTE): 0.1 10*3/uL (ref 0.0–0.4)
Eos: 2 %
HEMATOCRIT: 41.3 % (ref 34.0–46.6)
HIV Screen 4th Generation wRfx: NONREACTIVE
Hemoglobin: 13.8 g/dL (ref 11.1–15.9)
Hepatitis B Surface Ag: NEGATIVE
Immature Grans (Abs): 0 10*3/uL (ref 0.0–0.1)
Immature Granulocytes: 0 %
Lymphocytes Absolute: 1.8 10*3/uL (ref 0.7–3.1)
Lymphs: 35 %
MCH: 28.5 pg (ref 26.6–33.0)
MCHC: 33.4 g/dL (ref 31.5–35.7)
MCV: 85 fL (ref 79–97)
MONOCYTES: 6 %
MONOS ABS: 0.3 10*3/uL (ref 0.1–0.9)
NEUTROS ABS: 2.8 10*3/uL (ref 1.4–7.0)
Neutrophils: 57 %
Platelets: 331 10*3/uL (ref 150–379)
RBC: 4.85 x10E6/uL (ref 3.77–5.28)
RDW: 14.3 % (ref 12.3–15.4)
RPR Ser Ql: NONREACTIVE
RUBELLA: 1.54 {index} (ref >0.99)
Rh Factor: POSITIVE
WBC: 5 10*3/uL (ref 3.4–10.8)

## 2017-03-28 LAB — HEMOGLOBINOPATHY EVALUATION
HEMOGLOBIN A2 QUANTITATION: 2.6 % (ref 1.8–3.2)
HEMOGLOBIN F QUANTITATION: 0 % (ref 0.0–2.0)
HGB C: 0 %
HGB S: 0 %
HGB VARIANT: 0 %
Hgb A: 97.4 % (ref 96.4–98.8)

## 2017-03-28 LAB — CYSTIC FIBROSIS MUTATION 97: GENE DIS ANAL CARRIER INTERP BLD/T-IMP: NOT DETECTED

## 2017-03-28 NOTE — Telephone Encounter (Signed)
Dr. Jodi Mourning saw her for an initial ob visit. This could be an early pregnancy or early loss.  She needs to come in for a beta HCG draw today and then Friday morning.  This will give [place] more data to work with.  I will place orders for a f/u US next week pending lab results.  Please cancel the appointment with me for a ROB. Depending on US/lab results will need her appointment pushed back or with FP for possible management of failed pregnancy. Thank you.   Freeman Borba

## 2017-03-29 LAB — BETA HCG QUANT (REF LAB): hCG Quant: <1 m[IU]/mL

## 2017-03-30 ENCOUNTER — Telehealth: Payer: Self-pay

## 2017-03-30 ENCOUNTER — Other Ambulatory Visit: Payer: Medicaid Other

## 2017-03-30 NOTE — Telephone Encounter (Signed)
TC from pt stating Dr.Harper told her if she needed to speak with him directly she could call and ask for him   Please advise/call patient with concerns on recent SAB.

## 2017-04-11 ENCOUNTER — Telehealth: Payer: Self-pay

## 2017-04-11 NOTE — Telephone Encounter (Signed)
Pt called stating that she was reviewing her genetic screening results, and noticed that it stated that she was [redacted] wks pregnant and that there was a fetal fraction. Pt was not aware of what the fetal fraction was. I explained to pt that we pre-fill out the form that is attached to her blood sample which provides EDD. This is how the gestation age was calculated on her results. I also explained to her that her fetal fraction was less than 1% which could indicate that there is not a pregnancy detected. Pt had a beta hcg done on 3/20 where her quant was less than 1. Results had already been discussed with pt about pregnancy not being detected.

## 2017-04-16 ENCOUNTER — Encounter: Payer: Medicaid Other | Admitting: Obstetrics

## 2017-04-19 ENCOUNTER — Other Ambulatory Visit: Payer: Self-pay | Admitting: Certified Nurse Midwife

## 2017-04-25 ENCOUNTER — Other Ambulatory Visit: Payer: Medicaid Other

## 2017-04-25 ENCOUNTER — Ambulatory Visit: Payer: Medicaid Other | Admitting: Certified Nurse Midwife

## 2017-04-25 DIAGNOSIS — O039 Complete or unspecified spontaneous abortion without complication: Secondary | ICD-10-CM

## 2017-04-26 LAB — BETA HCG QUANT (REF LAB): hCG Quant: <1 m[IU]/mL

## 2017-07-14 ENCOUNTER — Other Ambulatory Visit: Payer: Self-pay

## 2017-07-14 ENCOUNTER — Emergency Department
Admission: EM | Admit: 2017-07-14 | Discharge: 2017-07-14 | Disposition: A | Discharge status: Home / Self Care | Payer: Medicaid Other | Attending: Emergency Medicine | Admitting: Emergency Medicine

## 2017-07-14 ENCOUNTER — Encounter: Payer: Self-pay | Admitting: Emergency Medicine

## 2017-07-14 DIAGNOSIS — N946 Dysmenorrhea, unspecified: Secondary | ICD-10-CM | POA: Insufficient documentation

## 2017-07-14 DIAGNOSIS — J45909 Unspecified asthma, uncomplicated: Secondary | ICD-10-CM | POA: Diagnosis not present

## 2017-07-14 DIAGNOSIS — Z7722 Contact with and (suspected) exposure to environmental tobacco smoke (acute) (chronic): Secondary | ICD-10-CM | POA: Insufficient documentation

## 2017-07-14 DIAGNOSIS — R1030 Lower abdominal pain, unspecified: Secondary | ICD-10-CM | POA: Diagnosis not present

## 2017-07-14 LAB — COMPREHENSIVE METABOLIC PANEL
ALBUMIN: 4.1 g/dL (ref 3.5–5.0)
ALT: 33 U/L (ref 0–44)
AST: 29 U/L (ref 15–41)
Alkaline Phosphatase: 84 U/L (ref 38–126)
Anion gap: 9 (ref 5–15)
BILIRUBIN TOTAL: 0.7 mg/dL (ref 0.3–1.2)
BUN: 8 mg/dL (ref 6–20)
CO2: 25 mmol/L (ref 22–32)
Calcium: 8.8 mg/dL — ABNORMAL LOW (ref 8.9–10.3)
Chloride: 105 mmol/L (ref 98–111)
Creatinine, Ser: 0.6 mg/dL (ref 0.44–1.00)
GFR calc Af Amer: >60 mL/min (ref >60)
GFR calc non Af Amer: >60 mL/min (ref >60)
GLUCOSE: 121 mg/dL — AB (ref 70–99)
Potassium: 3.6 mmol/L (ref 3.5–5.1)
Sodium: 139 mmol/L (ref 135–145)
Total Protein: 7.7 g/dL (ref 6.5–8.1)

## 2017-07-14 LAB — URINALYSIS, COMPLETE (UACMP) WITH MICROSCOPIC
Bilirubin Urine: NEGATIVE
GLUCOSE, UA: NEGATIVE mg/dL
Ketones, ur: NEGATIVE mg/dL
Leukocytes, UA: NEGATIVE
Nitrite: NEGATIVE
Protein, ur: 100 mg/dL — AB
RBC / HPF: >50 RBC/hpf — ABNORMAL HIGH (ref 0–5)
SPECIFIC GRAVITY, URINE: 1.024 (ref 1.005–1.030)
pH: 6 (ref 5.0–8.0)

## 2017-07-14 LAB — CBC
HEMATOCRIT: 38.3 % (ref 35.0–47.0)
Hemoglobin: 13.2 g/dL (ref 12.0–16.0)
MCH: 29.5 pg (ref 26.0–34.0)
MCHC: 34.4 g/dL (ref 32.0–36.0)
MCV: 85.8 fL (ref 80.0–100.0)
Platelets: 381 10*3/uL (ref 150–440)
RBC: 4.46 MIL/uL (ref 3.80–5.20)
RDW: 13.6 % (ref 11.5–14.5)
WBC: 5.7 10*3/uL (ref 3.6–11.0)

## 2017-07-14 LAB — LIPASE, BLOOD: Lipase: 26 U/L (ref 11–51)

## 2017-07-14 LAB — POCT PREGNANCY, URINE: Preg Test, Ur: NEGATIVE

## 2017-07-14 MED ORDER — NAPROXEN 500 MG PO TABS: 500.0000 mg | ORAL_TABLET | Freq: Two times a day (BID) | ORAL | 2 refills | Status: DC

## 2017-07-14 MED ORDER — ONDANSETRON HCL 4 MG/2ML IJ SOLN
INTRAMUSCULAR | Status: AC
  Filled 2017-07-14: qty 2

## 2017-07-14 MED ORDER — KETOROLAC TROMETHAMINE 30 MG/ML IJ SOLN
30.0000 mg | Freq: Once | INTRAMUSCULAR | Status: AC
  Administered 2017-07-14: 30 mg via INTRAMUSCULAR
  Filled 2017-07-14: qty 1

## 2017-07-14 MED ORDER — METHYLPREDNISOLONE SODIUM SUCC 125 MG IJ SOLR
INTRAMUSCULAR | Status: AC
  Filled 2017-07-14: qty 2

## 2017-07-14 MED ORDER — IPRATROPIUM-ALBUTEROL 0.5-2.5 (3) MG/3ML IN SOLN
RESPIRATORY_TRACT | Status: AC
  Filled 2017-07-14: qty 3

## 2017-07-14 MED ORDER — MORPHINE SULFATE (PF) 4 MG/ML IV SOLN
INTRAVENOUS | Status: AC
  Filled 2017-07-14: qty 1

## 2017-07-14 NOTE — ED Triage Notes (Signed)
C/O lower abdominal pain/ cramping x 1 week.  States vaginal bleeding started yesterday.  States has had similar symptoms in the past with miscarriages.  Last one 3 months ago.  G3 P0.  Patent is not aware of being pregnant currently.

## 2017-07-14 NOTE — ED Provider Notes (Signed)
[facility] Emergency Department Provider Note   ____________________________________________    I have reviewed the triage vital signs and the nursing notes.   HISTORY  Chief Complaint Abdominal Pain and Vaginal Bleeding     HPI Latoya Stark is a 22 y.o. female who presents with complaints of lower abdominal cramping and vaginal bleeding.  Patient reports bleeding started yesterday, cramping started over the last several days.  She reports that pain is mild to moderate.  Patient reports she had a miscarriage 3 months ago.  Has not had a.  Since then.  No fevers or chills.  No nausea or vomiting.  Has not taken anything for this.  Past Medical History:  Diagnosis Date  . ADHD (attention deficit hyperactivity disorder)   . Anemia   . Anxiety   . Asthma   . Depression   . Headache   . Obesity   . Seizures ([place])    "when I get too hot"  . Trichomonas infection   . UTI (lower urinary tract infection)     Patient Active Problem List   Diagnosis Date Noted  . Supervision of normal pregnancy, antepartum 03/19/2017  . Constipation 02/24/2014  . CN (constipation) 12/23/2013  . Gonorrhea in female 10/05/2013  . BV (bacterial vaginosis) 09/22/2013  . Candidiasis of vulva and vagina 09/22/2013    Past Surgical History:  Procedure Laterality Date  . ADENOIDECTOMY    . DILATION AND EVACUATION N/A 08/09/2013   Procedure: DILATATION AND EVACUATION;  Surgeon: Frederico Hamman, MD;  Location: [facility];  Service: Gynecology;  Laterality: N/A;  . ORTHOPEDIC SURGERY Left    left hand  . TONSILLECTOMY      Prior to Admission medications   Medication Sig Start Date End Date Taking? Authorizing Provider  acetaminophen (TYLENOL) 500 MG tablet Take 1,000 mg by mouth every 6 (six) hours as needed for headache.    [provider]  albuterol (PROVENTIL HFA;VENTOLIN HFA) 108 (90 Base) MCG/ACT inhaler Inhale 2 puffs into the lungs every 6 (six) hours  as needed for wheezing or shortness of breath.    [provider]  Doxylamine-Pyridoxine (DICLEGIS) 10-10 MG TBEC Take 1 tab in AM, 1 tab mid afternoon 2 tabs at bedtime. Max dose 4 tabs daily. 02/08/17   Shelly Bombard, MD  HYDROcodone-acetaminophen (NORCO) 5-325 MG tablet Take 1 tablet by mouth every 6 (six) hours as needed for moderate pain. Patient not taking: Reported on 01/29/2017 10/23/16   Duanne Guess, PA-C  Hyoscyamine Sulfate SL (LEVSIN/SL) 0.125 MG SUBL Place 0.125 mg under the tongue 3 (three) times daily as needed. 03/10/16   Charlann Lange, PA-C  ibuprofen (ADVIL,MOTRIN) 200 MG tablet Take 400 mg by mouth every 6 (six) hours as needed for moderate pain.    [provider]  metroNIDAZOLE (FLAGYL) 500 MG tablet TAKE 1 TABLET (500 MG TOTAL) BY MOUTH 2 (TWO) TIMES DAILY. 03/21/17   Shelly Bombard, MD  naproxen (NAPROSYN) 500 MG tablet Take 1 tablet (500 mg total) by mouth 2 (two) times daily with a meal. 07/14/17   Lavonia Drafts, MD  nitrofurantoin, macrocrystal-monohydrate, (MACROBID) 100 MG capsule Take 1 capsule (100 mg total) by mouth 2 (two) times daily. 03/23/17   Shelly Bombard, MD  ondansetron (ZOFRAN) 4 MG tablet Take 1 tablet (4 mg total) by mouth every 6 (six) hours. Patient not taking: Reported on 03/19/2017 03/10/16   Charlann Lange, PA-C  phenytoin (DILANTIN) 100 MG ER capsule  Take 1 capsule (100 mg total) by mouth 3 (three) times daily. Patient not taking: Reported on 03/19/2017 07/23/16 07/23/17  Nance Pear, MD  Prenat-FeCbn-FeAspGl-FA-Omega (OB COMPLETE PETITE) 35-5-1-200 MG CAPS Take 1 capsule by mouth daily before breakfast. 11/02/16   Shelly Bombard, MD  Prenat-FePoly-Metf-FA-DHA-DSS (VITAFOL FE+) 90-1-200 & 50 MG CPPK Take 2 tablets by mouth daily before breakfast. 01/29/17   Shelly Bombard, MD  promethazine (PHENERGAN) 25 MG suppository Place 1 suppository (25 mg total) rectally every 6 (six) hours as needed for nausea. 12/12/15 12/11/16   Nance Pear, MD  promethazine (PHENERGAN) 25 MG tablet Take 1 tablet (25 mg total) by mouth every 6 (six) hours as needed for nausea or vomiting. 03/19/17   Shelly Bombard, MD  terconazole (TERAZOL 7) 0.4 % vaginal cream Place 1 applicator vaginally at bedtime. 03/21/17   Shelly Bombard, MD     Allergies Milk-related compounds; Mushroom extract complex; Penicillins; Cheese; Latex; and Sulfa antibiotics  Family History  Problem Relation Age of Onset  . Seizures Mother   . Diabetes Maternal Grandmother   . Colon cancer Maternal Grandmother   . Hypertension Maternal Grandmother   . Clotting disorder Maternal Grandmother   . Breast cancer Maternal Grandmother     Social History Social History   Tobacco Use  . Smoking status: Passive Smoke Exposure - Never Smoker  . Smokeless tobacco: Never Used  Substance Use Topics  . Alcohol use: No    Alcohol/week: 0.0 oz  . Drug use: No    Review of Systems  Constitutional: No fever/chills Eyes: No visual changes.  ENT: No sore throat. Cardiovascular: Denies chest pain. Respiratory: Denies shortness of breath. Gastrointestinal:  No nausea, no vomiting.   Genitourinary: As above but no dysuria Musculoskeletal: Negative for back pain. Skin: Negative for rash. Neurological: Negative for headaches   ____________________________________________   PHYSICAL EXAM:  VITAL SIGNS: ED Triage Vitals  Enc Vitals Group     BP 07/14/17 1135 120/89     Pulse Rate 07/14/17 1135 (!) 102     Resp 07/14/17 1135 16     Temp 07/14/17 1135 98.2 F (36.8 C)     Temp Source 07/14/17 1135 Oral     SpO2 07/14/17 1135 97 %     Weight 07/14/17 1133 127 kg (280 lb)     Height 07/14/17 1133 1.6 m (5\' 3" )     Head Circumference --      Peak Flow --      Pain Score 07/14/17 1133 10     Pain Loc --      Pain Edu? --      Excl. in [place]? --     Constitutional: Alert and oriented.  Pleasant and interactive Eyes: Conjunctivae are normal.    Nose: No congestion/rhinnorhea. Mouth/Throat: Mucous membranes are moist.    Cardiovascular: Normal rate, regular rhythm. Grossly normal heart sounds.  Good peripheral circulation. Respiratory: Normal respiratory effort.  No retractions. Lungs CTAB. Gastrointestinal: Soft and nontender. No distention.  No CVA tenderness.   Neurologic:  Normal speech and language. No gross focal neurologic deficits are appreciated.  Skin:  Skin is warm, dry and intact. No rash noted. Psychiatric: Mood and affect are normal. Speech and behavior are normal.  ____________________________________________   LABS (all labs ordered are listed, but only abnormal results are displayed)  Labs Reviewed  COMPREHENSIVE METABOLIC PANEL - Abnormal; Notable for the following components:      Result Value   Glucose, Bld  121 (*)    Calcium 8.8 (*)    All other components within normal limits  URINALYSIS, COMPLETE (UACMP) WITH MICROSCOPIC - Abnormal; Notable for the following components:   Color, Urine AMBER (*)    APPearance CLOUDY (*)    Hgb urine dipstick LARGE (*)    Protein, ur 100 (*)    RBC / HPF >50 (*)    Bacteria, UA MANY (*)    All other components within normal limits  LIPASE, BLOOD  CBC  POCT PREGNANCY, URINE  POC URINE PREG, ED   ____________________________________________  EKG   ____________________________________________  RADIOLOGY  None ____________________________________________   PROCEDURES  Procedure(s) performed: No  Procedures   Critical Care performed: No ____________________________________________   INITIAL IMPRESSION / ASSESSMENT AND PLAN / ED COURSE  Pertinent labs & imaging results that were available during my care of the patient were reviewed by me and considered in my medical decision making (see chart for details).  Patient well-appearing in no acute distress.  Patency test is negative suspect menstrual cramping as the cause of her pain, will treat  with IM Toradol check labs.  Lab work is entirely unremarkable.  Hemoglobin is normal.  She is improved after Toradol, recommend treatment with NSAIDs rest and outpatient follow-up with PCP.  Return precautions discussed    ____________________________________________   FINAL CLINICAL IMPRESSION(S) / ED DIAGNOSES  Final diagnoses:  Painful menstruation        Note:  This document was prepared using Dragon voice recognition software and may include unintentional dictation errors.    Lavonia Drafts, MD 07/14/17 1322

## 2017-08-20 ENCOUNTER — Other Ambulatory Visit: Payer: Self-pay

## 2017-08-20 ENCOUNTER — Emergency Department
Admission: EM | Admit: 2017-08-20 | Discharge: 2017-08-20 | Disposition: A | Discharge status: Home / Self Care | Payer: Medicaid Other | Attending: Emergency Medicine | Admitting: Emergency Medicine

## 2017-08-20 ENCOUNTER — Encounter: Payer: Self-pay | Admitting: Emergency Medicine

## 2017-08-20 DIAGNOSIS — Z79899 Other long term (current) drug therapy: Secondary | ICD-10-CM | POA: Diagnosis not present

## 2017-08-20 DIAGNOSIS — Z91040 Latex allergy status: Secondary | ICD-10-CM | POA: Insufficient documentation

## 2017-08-20 DIAGNOSIS — J40 Bronchitis, not specified as acute or chronic: Secondary | ICD-10-CM | POA: Diagnosis not present

## 2017-08-20 DIAGNOSIS — Z7722 Contact with and (suspected) exposure to environmental tobacco smoke (acute) (chronic): Secondary | ICD-10-CM | POA: Insufficient documentation

## 2017-08-20 DIAGNOSIS — R05 Cough: Secondary | ICD-10-CM | POA: Diagnosis present

## 2017-08-20 LAB — POCT PREGNANCY, URINE: Preg Test, Ur: NEGATIVE

## 2017-08-20 MED ORDER — ALBUTEROL SULFATE HFA 108 (90 BASE) MCG/ACT IN AERS: 2.0000 | INHALATION_SPRAY | Freq: Four times a day (QID) | RESPIRATORY_TRACT | 0 refills | Status: DC | PRN

## 2017-08-20 MED ORDER — AZITHROMYCIN 250 MG PO TABS: ORAL_TABLET | ORAL | 0 refills | Status: DC

## 2017-08-20 MED ORDER — GUAIFENESIN-DM 100-10 MG/5ML PO SYRP: 5.0000 mL | ORAL_SOLUTION | ORAL | 0 refills | Status: DC | PRN

## 2017-08-20 MED ORDER — IPRATROPIUM-ALBUTEROL 0.5-2.5 (3) MG/3ML IN SOLN
3.0000 mL | Freq: Once | RESPIRATORY_TRACT | Status: AC
  Administered 2017-08-20: 3 mL via RESPIRATORY_TRACT
  Filled 2017-08-20: qty 3

## 2017-08-20 MED ORDER — PREDNISONE 10 MG PO TABS: ORAL_TABLET | ORAL | 0 refills | Status: DC

## 2017-08-20 MED ORDER — METHYLPREDNISOLONE SODIUM SUCC 125 MG IJ SOLR
125.0000 mg | Freq: Once | INTRAMUSCULAR | Status: AC
  Administered 2017-08-20: 125 mg via INTRAMUSCULAR
  Filled 2017-08-20: qty 2

## 2017-08-20 NOTE — ED Notes (Signed)
See triage note  Presents with a 2 week hx of cough  Min relief with family's inhaler  No fever

## 2017-08-20 NOTE — ED Notes (Signed)
Patient states she uses [facility] in [place] as her pharmacy, unable to locate in [facility].

## 2017-08-20 NOTE — ED Notes (Signed)
First Nurse Note: Patient coughing X 2 weeks, wearing face mask.  Placed in separate wait area.

## 2017-08-20 NOTE — ED Provider Notes (Signed)
[facility] Emergency Department Provider Note  ____________________________________________  Time seen: Approximately 12:23 PM  I have reviewed the triage vital signs and the nursing notes.   HISTORY  Chief Complaint Cough    HPI Latoya Stark is a 22 y.o. female the presents emergency department for evaluation of nonproductive cough for 2 weeks.  Patient has a history of asthma and seasonal allergies.  She says she is "allergic to nature. " She does not smoke.  No fever, chills, nasal congestion, sore throat, chest pain.   Past Medical History:  Diagnosis Date  . ADHD (attention deficit hyperactivity disorder)   . Anemia   . Anxiety   . Asthma   . Depression   . Headache   . Obesity   . Seizures ([place])    "when I get too hot"  . Trichomonas infection   . UTI (lower urinary tract infection)     Patient Active Problem List   Diagnosis Date Noted  . Supervision of normal pregnancy, antepartum 03/19/2017  . Constipation 02/24/2014  . CN (constipation) 12/23/2013  . Gonorrhea in female 10/05/2013  . BV (bacterial vaginosis) 09/22/2013  . Candidiasis of vulva and vagina 09/22/2013    Past Surgical History:  Procedure Laterality Date  . ADENOIDECTOMY    . DILATION AND EVACUATION N/A 08/09/2013   Procedure: DILATATION AND EVACUATION;  Surgeon: Frederico Hamman, MD;  Location: [facility];  Service: Gynecology;  Laterality: N/A;  . ORTHOPEDIC SURGERY Left    left hand  . TONSILLECTOMY      Prior to Admission medications   Medication Sig Start Date End Date Taking? Authorizing Provider  acetaminophen (TYLENOL) 500 MG tablet Take 1,000 mg by mouth every 6 (six) hours as needed for headache.    [provider]  albuterol (PROVENTIL HFA;VENTOLIN HFA) 108 (90 Base) MCG/ACT inhaler Inhale 2 puffs into the lungs every 6 (six) hours as needed for wheezing or shortness of breath. 08/20/17   Laban Emperor, PA-C  azithromycin (ZITHROMAX Z-PAK) 250  MG tablet Take 2 tablets (500 mg) on  Day 1,  followed by 1 tablet (250 mg) once daily on Days 2 through 5. 08/20/17   Laban Emperor, PA-C  Doxylamine-Pyridoxine (DICLEGIS) 10-10 MG TBEC Take 1 tab in AM, 1 tab mid afternoon 2 tabs at bedtime. Max dose 4 tabs daily. 02/08/17   Shelly Bombard, MD  guaiFENesin-dextromethorphan (ROBITUSSIN DM) 100-10 MG/5ML syrup Take 5 mLs by mouth every 4 (four) hours as needed for cough. 08/20/17   Laban Emperor, PA-C  HYDROcodone-acetaminophen (NORCO) 5-325 MG tablet Take 1 tablet by mouth every 6 (six) hours as needed for moderate pain. Patient not taking: Reported on 01/29/2017 10/23/16   Duanne Guess, PA-C  Hyoscyamine Sulfate SL (LEVSIN/SL) 0.125 MG SUBL Place 0.125 mg under the tongue 3 (three) times daily as needed. 03/10/16   Charlann Lange, PA-C  ibuprofen (ADVIL,MOTRIN) 200 MG tablet Take 400 mg by mouth every 6 (six) hours as needed for moderate pain.    [provider]  metroNIDAZOLE (FLAGYL) 500 MG tablet TAKE 1 TABLET (500 MG TOTAL) BY MOUTH 2 (TWO) TIMES DAILY. 03/21/17   Shelly Bombard, MD  naproxen (NAPROSYN) 500 MG tablet Take 1 tablet (500 mg total) by mouth 2 (two) times daily with a meal. 07/14/17   Lavonia Drafts, MD  nitrofurantoin, macrocrystal-monohydrate, (MACROBID) 100 MG capsule Take 1 capsule (100 mg total) by mouth 2 (two) times daily. 03/23/17   Shelly Bombard, MD  ondansetron (ZOFRAN) 4 MG tablet Take 1 tablet (4 mg total) by mouth every 6 (six) hours. Patient not taking: Reported on 03/19/2017 03/10/16   Charlann Lange, PA-C  phenytoin (DILANTIN) 100 MG ER capsule Take 1 capsule (100 mg total) by mouth 3 (three) times daily. Patient not taking: Reported on 03/19/2017 07/23/16 07/23/17  Nance Pear, MD  predniSONE (DELTASONE) 10 MG tablet Take 6 tablets on day 1, take 5 tablets on day 2, take 4 tablets on day 3, take 3 tablets on day 4, take 2 tablets on day 5, take 1 tablet on day 6 08/20/17   Laban Emperor, PA-C   Prenat-FeCbn-FeAspGl-FA-Omega (OB COMPLETE PETITE) 35-5-1-200 MG CAPS Take 1 capsule by mouth daily before breakfast. 11/02/16   Shelly Bombard, MD  Prenat-FePoly-Metf-FA-DHA-DSS (VITAFOL FE+) 90-1-200 & 50 MG CPPK Take 2 tablets by mouth daily before breakfast. 01/29/17   Shelly Bombard, MD  promethazine (PHENERGAN) 25 MG suppository Place 1 suppository (25 mg total) rectally every 6 (six) hours as needed for nausea. 12/12/15 12/11/16  Nance Pear, MD  promethazine (PHENERGAN) 25 MG tablet Take 1 tablet (25 mg total) by mouth every 6 (six) hours as needed for nausea or vomiting. 03/19/17   Shelly Bombard, MD  terconazole (TERAZOL 7) 0.4 % vaginal cream Place 1 applicator vaginally at bedtime. 03/21/17   Shelly Bombard, MD    Allergies Milk-related compounds; Mushroom extract complex; Penicillins; Cheese; Latex; and Sulfa antibiotics  Family History  Problem Relation Age of Onset  . Seizures Mother   . Diabetes Maternal Grandmother   . Colon cancer Maternal Grandmother   . Hypertension Maternal Grandmother   . Clotting disorder Maternal Grandmother   . Breast cancer Maternal Grandmother     Social History Social History   Tobacco Use  . Smoking status: Passive Smoke Exposure - Never Smoker  . Smokeless tobacco: Never Used  Substance Use Topics  . Alcohol use: No    Alcohol/week: 0.0 standard drinks  . Drug use: No     Review of Systems  Constitutional: No fever/chills Eyes: No visual changes. No discharge. ENT: Negative for congestion and rhinorrhea. Cardiovascular: No chest pain. Respiratory: Positive for cough. No SOB. Gastrointestinal: No abdominal pain.  No nausea, no vomiting.   Musculoskeletal: Negative for musculoskeletal pain. Skin: Negative for rash, abrasions, lacerations, ecchymosis. Neurological: Negative for headaches.   ____________________________________________   PHYSICAL EXAM:  VITAL SIGNS: ED Triage Vitals  Enc Vitals Group     BP  08/20/17 1130 (!) 107/93     Pulse Rate 08/20/17 1127 85     Resp 08/20/17 1127 18     Temp 08/20/17 1127 98.5 F (36.9 C)     Temp Source 08/20/17 1127 Oral     SpO2 08/20/17 1127 99 %     Weight 08/20/17 1128 220 lb (99.8 kg)     Height 08/20/17 1128 5\' 3"  (1.6 m)     Head Circumference --      Peak Flow --      Pain Score 08/20/17 1128 0     Pain Loc --      Pain Edu? --      Excl. in [place]? --      Constitutional: Alert and oriented. Well appearing and in no acute distress. Eyes: Conjunctivae are normal. PERRL. EOMI. No discharge. Head: Atraumatic. ENT: No frontal and maxillary sinus tenderness.      Ears: Tympanic membranes pearly gray with good landmarks. No discharge.  Nose: No congestion/rhinnorhea.      Mouth/Throat: Mucous membranes are moist. Oropharynx non-erythematous. Tonsils not enlarged. No exudates. Uvula midline. Neck: No stridor.   Hematological/Lymphatic/Immunilogical: No cervical lymphadenopathy. Cardiovascular: Normal rate, regular rhythm.  Good peripheral circulation. Respiratory: Normal respiratory effort without tachypnea or retractions. Lungs CTAB. Good air entry to the bases with no decreased or absent breath sounds. Gastrointestinal: Bowel sounds 4 quadrants. Soft and nontender to palpation. No guarding or rigidity. No palpable masses. No distention. Musculoskeletal: Full range of motion to all extremities. No gross deformities appreciated. Neurologic:  Normal speech and language. No gross focal neurologic deficits are appreciated.  Skin:  Skin is warm, dry and intact. No rash noted. Psychiatric: Mood and affect are normal. Speech and behavior are normal. Patient exhibits appropriate insight and judgement.   ____________________________________________   LABS (all labs ordered are listed, but only abnormal results are displayed)  Labs Reviewed  POCT PREGNANCY, URINE    ____________________________________________  EKG   ____________________________________________  RADIOLOGY   No results found.  ____________________________________________    PROCEDURES  Procedure(s) performed:    Procedures    Medications  methylPREDNISolone sodium succinate (SOLU-MEDROL) 125 mg/2 mL injection 125 mg (125 mg Intramuscular Given 08/20/17 1234)  ipratropium-albuterol (DUONEB) 0.5-2.5 (3) MG/3ML nebulizer solution 3 mL (3 mLs Nebulization Given 08/20/17 1234)     ____________________________________________   INITIAL IMPRESSION / ASSESSMENT AND PLAN / ED COURSE  Pertinent labs & imaging results that were available during my care of the patient were reviewed by me and considered in my medical decision making (see chart for details).  Review of the [place] CSRS was performed in accordance of the [place] prior to dispensing any controlled drugs.     Patient's diagnosis is consistent with bronchitis. Vital signs and exam are reassuring. Patient appears well and is staying well hydrated. Patient should alternate tylenol and ibuprofen for fever. Patient feels comfortable going home. Patient will be discharged home with prescriptions for azithromycin, prednisone, robitussin, albuterol inhaler. Patient is to follow up with PCP as needed or otherwise directed. Patient is given ED precautions to return to the ED for any worsening or new symptoms.     ____________________________________________  FINAL CLINICAL IMPRESSION(S) / ED DIAGNOSES  Final diagnoses:  Bronchitis      NEW MEDICATIONS STARTED DURING THIS VISIT:  ED Discharge Orders         Ordered    predniSONE (DELTASONE) 10 MG tablet     08/20/17 1226    azithromycin (ZITHROMAX Z-PAK) 250 MG tablet     08/20/17 1226    albuterol (PROVENTIL HFA;VENTOLIN HFA) 108 (90 Base) MCG/ACT inhaler  Every 6 hours PRN     08/20/17 1226    guaiFENesin-dextromethorphan (ROBITUSSIN DM) 100-10 MG/5ML syrup   Every 4 hours PRN     08/20/17 1226              This chart was dictated using voice recognition software/Dragon. Despite best efforts to proofread, errors can occur which can change the meaning. Any change was purely unintentional.    Laban Emperor, PA-C 08/20/17 1701    Lavonia Drafts, MD 08/21/17 1407

## 2017-08-20 NOTE — ED Triage Notes (Signed)
Cough x 2 weeks

## 2017-10-08 IMAGING — CT CT HEAD W/O CM
3 of 8 series · 13 of 47 positions shown, 15 images · non-contrast
Comparison: June 18, 2015

CLINICAL DATA: Seizure with fall and pain.

EXAM:
CT HEAD WITHOUT CONTRAST
CT CERVICAL SPINE WITHOUT CONTRAST
TECHNIQUE: Multidetector CT imaging of the head and cervical spine was
performed following the standard protocol without intravenous
contrast. Multiplanar CT image reconstructions of the cervical spine
were also generated.

[Series 5: coronal · coronal · 0.29mm/px · 3 of 64 slices shown]
[im 19/64  brain]
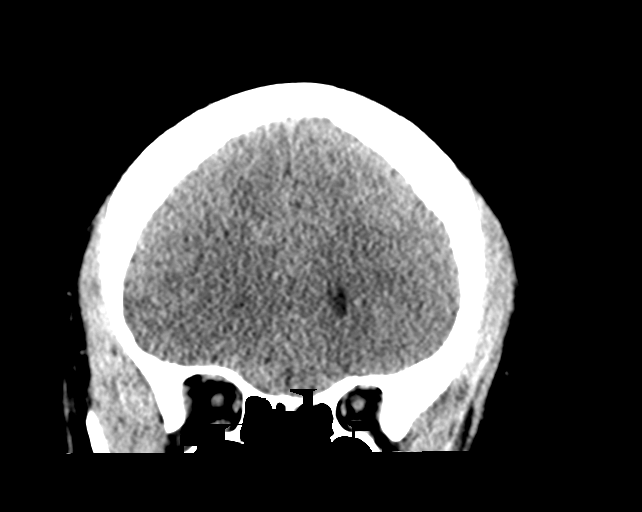
[im 28/64  brain]
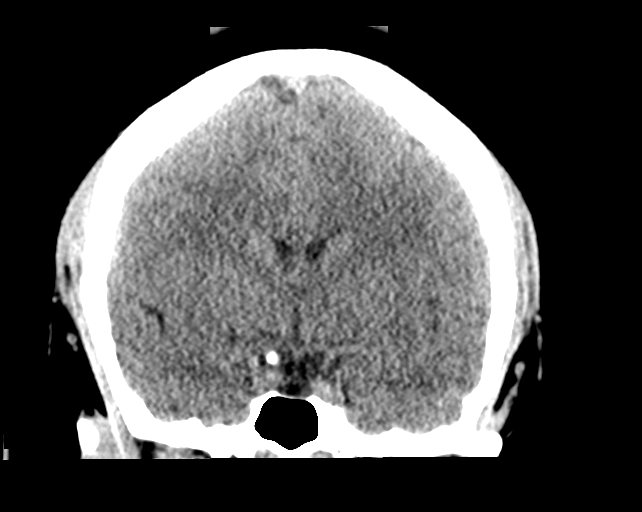
[im 37/64  brain]
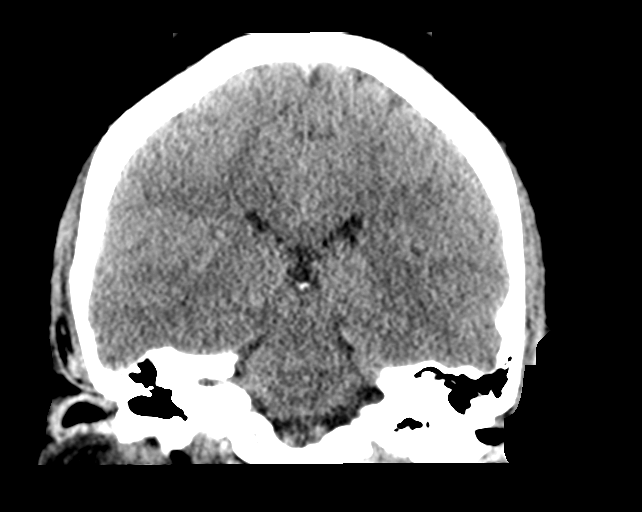

[Series 6: sagittal · sagittal · 0.30mm/px · 2 of 50 slices shown]
[im 17/50  brain]
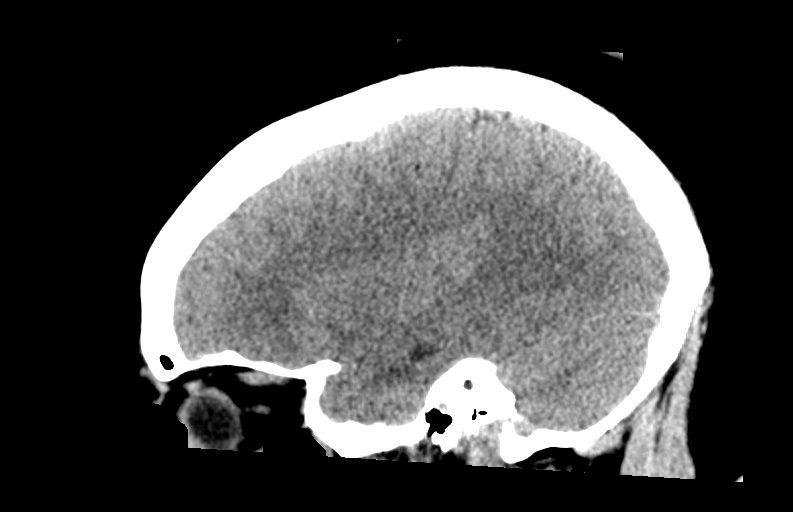
[im 33/50  brain]
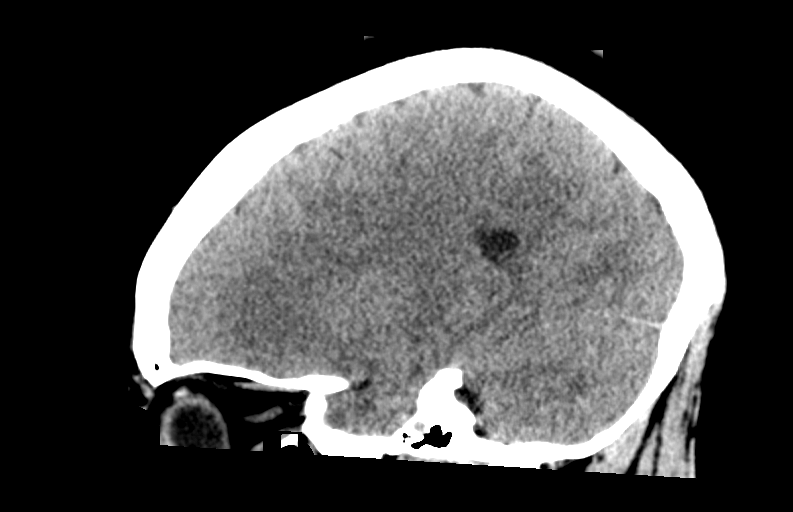

[Series 10: axial recon · axial · 0.24mm/px · z∈[+1587,+1720]mm · 8 of 87 slices shown, 10 images]
[im 9/87  brain]
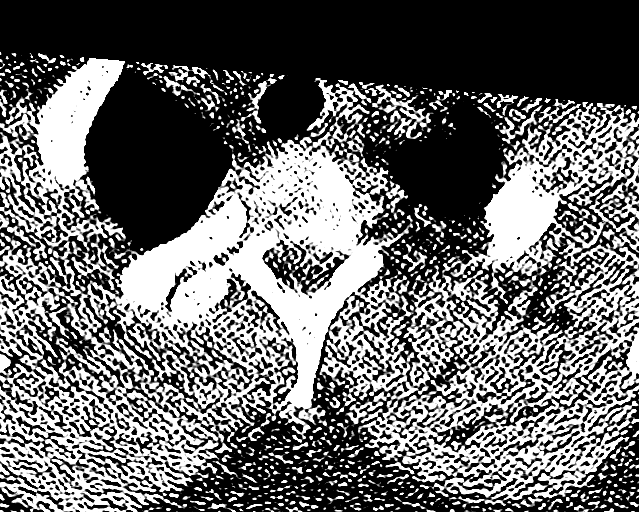
[im 9/87  bone]
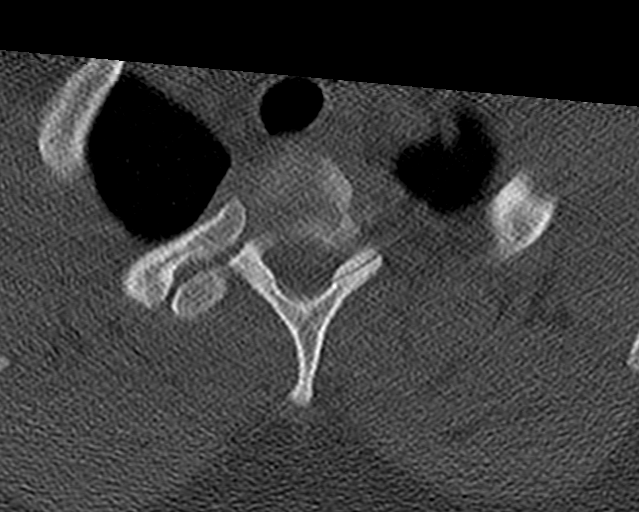
[im 18/87  brain]
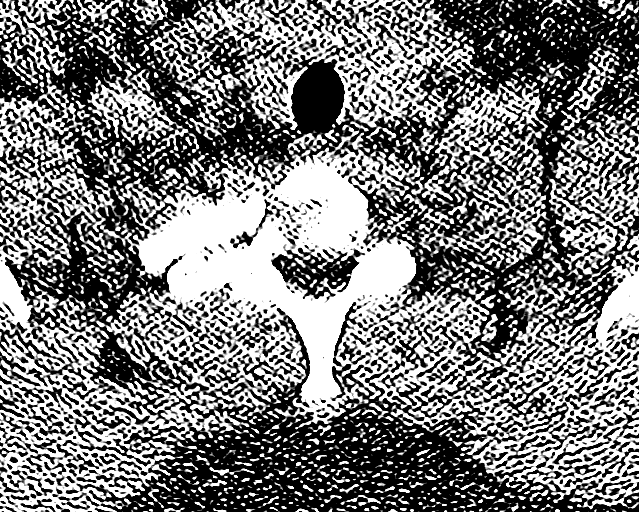
[im 26/87  brain]
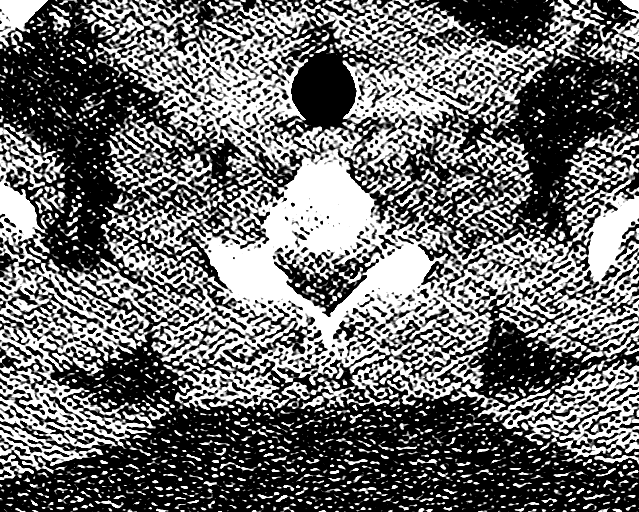
[im 35/87  brain]
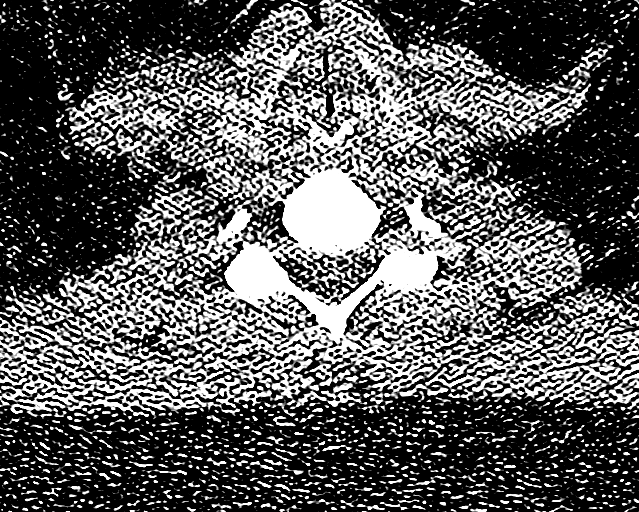
[im 52/87  brain]
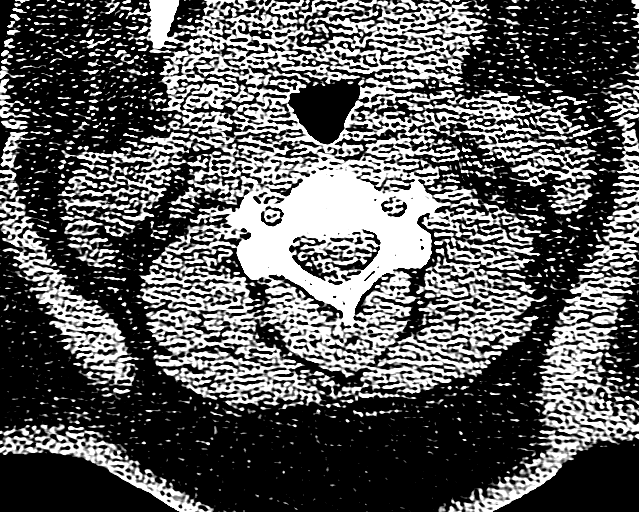
[im 52/87  bone]
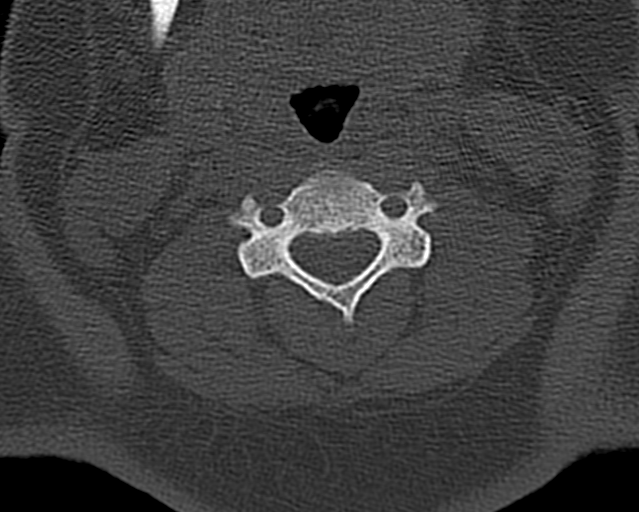
[im 61/87  brain]
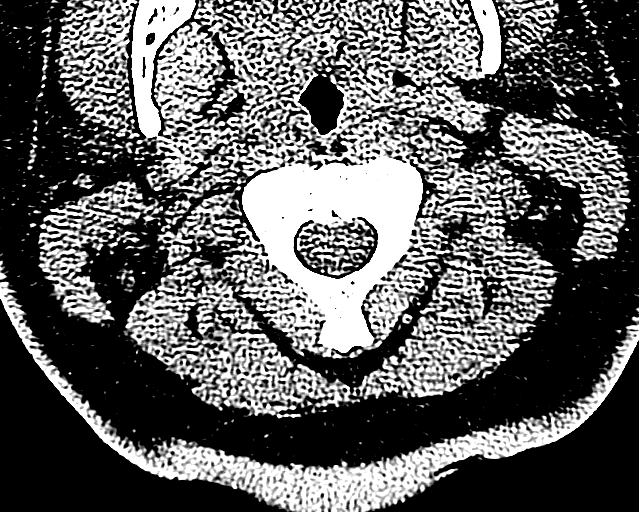
[im 69/87  brain]
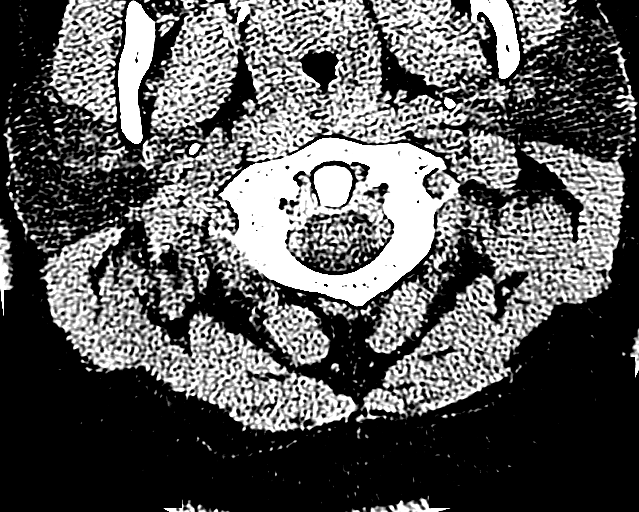
[im 78/87  brain]
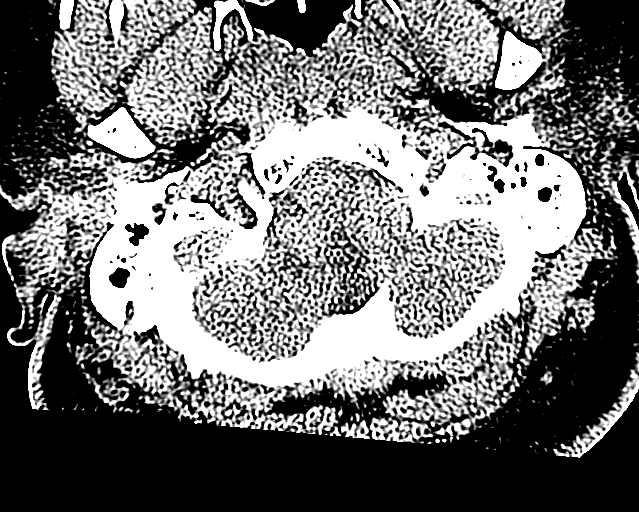

[13 of 47 positions shown; findings below may reference images not displayed]

FINDINGS: CT HEAD FINDINGS

Brain: No evidence of acute infarction, hemorrhage, hydrocephalus,
extra-axial collection or mass lesion/mass effect.

Vascular: No hyperdense vessel or unexpected calcification.

Skull: Normal. Negative for fracture or focal lesion.

Sinuses/Orbits: No acute finding.

Other: None.

CT CERVICAL SPINE FINDINGS

Alignment: Normal.

Skull base and vertebrae: No acute fracture. No primary bone lesion
or focal pathologic process.

Soft tissues and spinal canal: No prevertebral fluid or swelling. No
visible canal hematoma.

Disc levels:  No significant degenerative changes.

Upper chest: Negative.

Other: No other abnormalities.
IMPRESSION: 1. No acute intracranial abnormality.
2. No fracture or traumatic malalignment in the cervical spine.

## 2017-11-25 ENCOUNTER — Other Ambulatory Visit: Payer: Self-pay

## 2017-11-25 ENCOUNTER — Emergency Department (HOSPITAL_COMMUNITY)
Admission: EM | Admit: 2017-11-25 | Discharge: 2017-11-25 | Disposition: A | Discharge status: Home / Self Care | Payer: Medicaid Other | Attending: Emergency Medicine | Admitting: Emergency Medicine

## 2017-11-25 DIAGNOSIS — Z79899 Other long term (current) drug therapy: Secondary | ICD-10-CM | POA: Diagnosis not present

## 2017-11-25 DIAGNOSIS — F419 Anxiety disorder, unspecified: Secondary | ICD-10-CM | POA: Insufficient documentation

## 2017-11-25 DIAGNOSIS — Z7722 Contact with and (suspected) exposure to environmental tobacco smoke (acute) (chronic): Secondary | ICD-10-CM | POA: Insufficient documentation

## 2017-11-25 DIAGNOSIS — J45909 Unspecified asthma, uncomplicated: Secondary | ICD-10-CM | POA: Insufficient documentation

## 2017-11-25 DIAGNOSIS — G40909 Epilepsy, unspecified, not intractable, without status epilepticus: Secondary | ICD-10-CM | POA: Diagnosis present

## 2017-11-25 DIAGNOSIS — R569 Unspecified convulsions: Secondary | ICD-10-CM

## 2017-11-25 LAB — CBC
HCT: 44.1 % (ref 36.0–46.0)
Hemoglobin: 13.9 g/dL (ref 12.0–15.0)
MCH: 28.2 pg (ref 26.0–34.0)
MCHC: 31.5 g/dL (ref 30.0–36.0)
MCV: 89.5 fL (ref 80.0–100.0)
PLATELETS: 378 10*3/uL (ref 150–400)
RBC: 4.93 MIL/uL (ref 3.87–5.11)
RDW: 13.1 % (ref 11.5–15.5)
WBC: 5 10*3/uL (ref 4.0–10.5)
nRBC: 0 % (ref 0.0–0.2)

## 2017-11-25 LAB — BASIC METABOLIC PANEL
Anion gap: 9 (ref 5–15)
BUN: 10 mg/dL (ref 6–20)
CALCIUM: 9 mg/dL (ref 8.9–10.3)
CO2: 28 mmol/L (ref 22–32)
Chloride: 105 mmol/L (ref 98–111)
Creatinine, Ser: 0.65 mg/dL (ref 0.44–1.00)
GFR calc Af Amer: >60 mL/min (ref >60)
GFR calc non Af Amer: >60 mL/min (ref >60)
GLUCOSE: 85 mg/dL (ref 70–99)
POTASSIUM: 3.9 mmol/L (ref 3.5–5.1)
Sodium: 142 mmol/L (ref 135–145)

## 2017-11-25 LAB — CBG MONITORING, ED: GLUCOSE-CAPILLARY: 79 mg/dL (ref 70–99)

## 2017-11-25 LAB — I-STAT BETA HCG BLOOD, ED (MC, WL, AP ONLY): I-stat hCG, quantitative: <5 m[IU]/mL (ref <5)

## 2017-11-25 LAB — PHENYTOIN LEVEL, TOTAL

## 2017-11-25 MED ORDER — PHENYTOIN SODIUM EXTENDED 100 MG PO CAPS: 100.0000 mg | ORAL_CAPSULE | Freq: Four times a day (QID) | ORAL | 1 refills | Status: DC

## 2017-11-25 MED ORDER — VALPROATE SODIUM 500 MG/5ML IV SOLN
2000.0000 mg | Freq: Once | INTRAVENOUS | Status: AC
  Administered 2017-11-25: 2000 mg via INTRAVENOUS
  Filled 2017-11-25: qty 20

## 2017-11-25 MED ORDER — KETOROLAC TROMETHAMINE 30 MG/ML IJ SOLN
15.0000 mg | Freq: Once | INTRAMUSCULAR | Status: AC
  Administered 2017-11-25: 15 mg via INTRAVENOUS
  Filled 2017-11-25: qty 1

## 2017-11-25 MED ORDER — ONDANSETRON HCL 4 MG/2ML IJ SOLN
4.0000 mg | Freq: Once | INTRAMUSCULAR | Status: AC
  Administered 2017-11-25: 4 mg via INTRAVENOUS
  Filled 2017-11-25: qty 2

## 2017-11-25 MED ORDER — PHENYTOIN SODIUM EXTENDED 100 MG PO CAPS: 100.0000 mg | ORAL_CAPSULE | Freq: Four times a day (QID) | ORAL | 0 refills | Status: DC

## 2017-11-25 MED ORDER — ACETAMINOPHEN 500 MG PO TABS
1000.0000 mg | ORAL_TABLET | Freq: Once | ORAL | Status: AC
  Administered 2017-11-25: 1000 mg via ORAL
  Filled 2017-11-25: qty 2

## 2017-11-25 MED ORDER — SODIUM CHLORIDE 0.9 % IV BOLUS
1000.0000 mL | Freq: Once | INTRAVENOUS | Status: AC
  Administered 2017-11-25: 1000 mL via INTRAVENOUS

## 2017-11-25 NOTE — Discharge Instructions (Signed)
Please restart your seizure medication Please follow up with a neurologist for management of your seizures and headaches Return if you are worsening

## 2017-11-25 NOTE — ED Notes (Signed)
Patients significant other stated patient has not been on meds in the last month

## 2017-11-25 NOTE — ED Triage Notes (Signed)
Pt's husband reports she has a hx of seizures and was at work when she had one. Pt's husband reports she has been out of her seizure medication for 1.5 months. HE reports she takes phenytoin

## 2017-11-25 NOTE — ED Provider Notes (Signed)
[facility] DEPT Provider Note   CSN: [id number] Arrival date & time: 11/25/17  1234     History   Chief Complaint Chief Complaint  Patient presents with  . Seizures    HPI Latoya Stark is a 22 y.o. female who presents with a seizure. PMH significant for epilepsy, headaches, anxiety/depression. Celesta Gentile is at bedside and helps with history. He states that the patient was "zoning out" this morning and so he drove her to work. He kept asking if she was ok and she said yes. He states as soon as he drove away he was called by her coworkers and told him that she had a seizure. The seizure was self-limiting. He states that this would be the 3rd seizure that she's had in the past couple weeks. She's been off Depakote for about 2 months. She was previously seeing Dr. Gaynell Face but has to find a new neurologist and doesn't have one yet. She does admit to drinking alcohol last night - about three shots. Currently she has a headache, nausea, and epigastric abdominal pain. No fever, chills. She states her seizures are triggered by stress and being hot.  HPI  Past Medical History:  Diagnosis Date  . ADHD (attention deficit hyperactivity disorder)   . Anemia   . Anxiety   . Asthma   . Depression   . Headache   . Obesity   . Seizures ([place])    "when I get too hot"  . Trichomonas infection   . UTI (lower urinary tract infection)     Patient Active Problem List   Diagnosis Date Noted  . Supervision of normal pregnancy, antepartum 03/19/2017  . Constipation 02/24/2014  . CN (constipation) 12/23/2013  . Gonorrhea in female 10/05/2013  . BV (bacterial vaginosis) 09/22/2013  . Candidiasis of vulva and vagina 09/22/2013    Past Surgical History:  Procedure Laterality Date  . ADENOIDECTOMY    . DILATION AND EVACUATION N/A 08/09/2013   Procedure: DILATATION AND EVACUATION;  Surgeon: Frederico Hamman, MD;  Location: [facility];  Service: Gynecology;  Laterality:  N/A;  . ORTHOPEDIC SURGERY Left    left hand  . TONSILLECTOMY       OB History    Gravida  3   Para      Term      Preterm      AB  2   Living        SAB  2   TAB      Ectopic      Multiple      Live Births               Home Medications    Prior to Admission medications   Medication Sig Start Date End Date Taking? Authorizing Provider  acetaminophen (TYLENOL) 500 MG tablet Take 1,000 mg by mouth every 6 (six) hours as needed for headache.    [provider]  albuterol (PROVENTIL HFA;VENTOLIN HFA) 108 (90 Base) MCG/ACT inhaler Inhale 2 puffs into the lungs every 6 (six) hours as needed for wheezing or shortness of breath. 08/20/17   Laban Emperor, PA-C  azithromycin (ZITHROMAX Z-PAK) 250 MG tablet Take 2 tablets (500 mg) on  Day 1,  followed by 1 tablet (250 mg) once daily on Days 2 through 5. 08/20/17   Laban Emperor, PA-C  Doxylamine-Pyridoxine (DICLEGIS) 10-10 MG TBEC Take 1 tab in AM, 1 tab mid afternoon 2 tabs at bedtime. Max dose 4 tabs daily. 02/08/17  Shelly Bombard, MD  guaiFENesin-dextromethorphan [facility] DM) 100-10 MG/5ML syrup Take 5 mLs by mouth every 4 (four) hours as needed for cough. 08/20/17   Laban Emperor, PA-C  HYDROcodone-acetaminophen (NORCO) 5-325 MG tablet Take 1 tablet by mouth every 6 (six) hours as needed for moderate pain. Patient not taking: Reported on 01/29/2017 10/23/16   Duanne Guess, PA-C  Hyoscyamine Sulfate SL (LEVSIN/SL) 0.125 MG SUBL Place 0.125 mg under the tongue 3 (three) times daily as needed. 03/10/16   Charlann Lange, PA-C  ibuprofen (ADVIL,MOTRIN) 200 MG tablet Take 400 mg by mouth every 6 (six) hours as needed for moderate pain.    [provider]  metroNIDAZOLE (FLAGYL) 500 MG tablet TAKE 1 TABLET (500 MG TOTAL) BY MOUTH 2 (TWO) TIMES DAILY. 03/21/17   Shelly Bombard, MD  naproxen (NAPROSYN) 500 MG tablet Take 1 tablet (500 mg total) by mouth 2 (two) times daily with a meal. 07/14/17   Lavonia Drafts, MD  nitrofurantoin, macrocrystal-monohydrate, (MACROBID) 100 MG capsule Take 1 capsule (100 mg total) by mouth 2 (two) times daily. 03/23/17   Shelly Bombard, MD  ondansetron (ZOFRAN) 4 MG tablet Take 1 tablet (4 mg total) by mouth every 6 (six) hours. Patient not taking: Reported on 03/19/2017 03/10/16   Charlann Lange, PA-C  phenytoin (DILANTIN) 100 MG ER capsule Take 1 capsule (100 mg total) by mouth 3 (three) times daily. Patient not taking: Reported on 03/19/2017 07/23/16 07/23/17  Nance Pear, MD  predniSONE (DELTASONE) 10 MG tablet Take 6 tablets on day 1, take 5 tablets on day 2, take 4 tablets on day 3, take 3 tablets on day 4, take 2 tablets on day 5, take 1 tablet on day 6 08/20/17   Laban Emperor, PA-C  Prenat-FeCbn-FeAspGl-FA-Omega (OB COMPLETE PETITE) 35-5-1-200 MG CAPS Take 1 capsule by mouth daily before breakfast. 11/02/16   Shelly Bombard, MD  Prenat-FePoly-Metf-FA-DHA-DSS (VITAFOL FE+) 90-1-200 & 50 MG CPPK Take 2 tablets by mouth daily before breakfast. 01/29/17   Shelly Bombard, MD  promethazine (PHENERGAN) 25 MG suppository Place 1 suppository (25 mg total) rectally every 6 (six) hours as needed for nausea. 12/12/15 12/11/16  Nance Pear, MD  promethazine (PHENERGAN) 25 MG tablet Take 1 tablet (25 mg total) by mouth every 6 (six) hours as needed for nausea or vomiting. 03/19/17   Shelly Bombard, MD  terconazole (TERAZOL 7) 0.4 % vaginal cream Place 1 applicator vaginally at bedtime. 03/21/17   Shelly Bombard, MD    Family History Family History  Problem Relation Age of Onset  . Seizures Mother   . Diabetes Maternal Grandmother   . Colon cancer Maternal Grandmother   . Hypertension Maternal Grandmother   . Clotting disorder Maternal Grandmother   . Breast cancer Maternal Grandmother     Social History Social History   Tobacco Use  . Smoking status: Passive Smoke Exposure - Never Smoker  . Smokeless tobacco: Never Used  Substance Use Topics  .  Alcohol use: No    Alcohol/week: 0.0 standard drinks  . Drug use: No     Allergies   Milk-related compounds; Mushroom extract complex; Penicillins; Cheese; Latex; and Sulfa antibiotics   Review of Systems Review of Systems  Constitutional: Negative for chills and fever.  Respiratory: Negative for shortness of breath.   Cardiovascular: Negative for chest pain.  Gastrointestinal: Positive for abdominal pain, nausea and vomiting. Negative for diarrhea.  Genitourinary: Negative for dysuria and menstrual problem.  Neurological: Positive for  seizures and headaches.  All other systems reviewed and are negative.    Physical Exam Updated Vital Signs BP (!) 146/94 (BP Location: Left Arm)   Pulse 94   Temp 98.6 F (37 C) (Oral)   Resp (!) 98   Ht 5\' 7"  (1.702 m)   Wt 131.5 kg   LMP 01/08/2017 (LMP Unknown)   SpO2 98%   Breastfeeding? No   BMI 45.42 kg/m   Physical Exam  Constitutional: She is oriented to person, place, and time. She appears well-developed and well-nourished. No distress.  Calm and cooperative. A little tearful  HENT:  Head: Normocephalic and atraumatic.  Eyes: Pupils are equal, round, and reactive to light. Conjunctivae are normal. Right eye exhibits no discharge. Left eye exhibits no discharge. No scleral icterus.  Neck: Normal range of motion.  Cardiovascular: Normal rate and regular rhythm.  Pulmonary/Chest: Effort normal and breath sounds normal. No respiratory distress.  Abdominal: Soft. Bowel sounds are normal. She exhibits no distension. There is tenderness (mild epigastric tenderness).  Neurological: She is alert and oriented to person, place, and time.  Skin: Skin is warm and dry.  Psychiatric: She has a normal mood and affect. Her behavior is normal.  Nursing note and vitals reviewed.    ED Treatments / Results  Labs (all labs ordered are listed, but only abnormal results are displayed) Labs Reviewed  PHENYTOIN LEVEL, TOTAL - Abnormal; Notable  for the following components:      Result Value   Phenytoin Lvl <2.5 (*)    All other components within normal limits  CBC  BASIC METABOLIC PANEL  I-STAT BETA HCG BLOOD, ED ([facility], [facility], [facility] ONLY)  CBG MONITORING, ED    EKG None  Radiology No results found.  Procedures Procedures (including critical care time)  Medications Ordered in ED Medications  sodium chloride 0.9 % bolus 1,000 mL (0 mLs Intravenous Stopped 11/25/17 1528)  ketorolac (TORADOL) 30 MG/ML injection 15 mg (15 mg Intravenous Given 11/25/17 1409)  ondansetron (ZOFRAN) injection 4 mg (4 mg Intravenous Given 11/25/17 1410)  valproate (DEPACON) 2,000 mg in dextrose 5 % 50 mL IVPB (0 mg Intravenous Stopped 11/25/17 1708)  acetaminophen (TYLENOL) tablet 1,000 mg (1,000 mg Oral Given 11/25/17 1712)     Initial Impression / Assessment and Plan / ED Course  I have reviewed the triage vital signs and the nursing notes.  Pertinent labs & imaging results that were available during my care of the patient were reviewed by me and considered in my medical decision making (see chart for details).  22 year old female presents with a seizure at work today. She has had several seizures since being off her meds for a couple months. Could be related to drinking alcohol last night. She is hypertensive but otherwise vitals are normal. Labs are normal. She is not pregnant. She was given a Depakote load along with treatment for headache and nausea. On re-eval she is feeling better. She tolerated PO. Will d/c with neurology f/u.  Final Clinical Impressions(s) / ED Diagnoses   Final diagnoses:  Seizure [facility])    ED Discharge Orders    None       Recardo Evangelist, PA-C 11/25/17 [place], DO 11/26/17 1016

## 2018-03-15 ENCOUNTER — Encounter: Payer: Self-pay | Admitting: Obstetrics

## 2018-03-15 ENCOUNTER — Ambulatory Visit: Payer: Medicaid Other | Admitting: Obstetrics

## 2018-03-15 ENCOUNTER — Other Ambulatory Visit (HOSPITAL_COMMUNITY)
Admission: RE | Admit: 2018-03-15 | Discharge: 2018-03-15 | Disposition: A | Discharge status: Home / Self Care | Payer: Medicaid Other | Source: Ambulatory Visit | Attending: Obstetrics | Admitting: Obstetrics

## 2018-03-15 VITALS — BP 109/78 | HR 81 | Ht 66.0 in | Wt 306.2 lb

## 2018-03-15 DIAGNOSIS — M549 Dorsalgia, unspecified: Secondary | ICD-10-CM

## 2018-03-15 DIAGNOSIS — Z01419 Encounter for gynecological examination (general) (routine) without abnormal findings: Secondary | ICD-10-CM | POA: Insufficient documentation

## 2018-03-15 DIAGNOSIS — Z0000 Encounter for general adult medical examination without abnormal findings: Secondary | ICD-10-CM

## 2018-03-15 DIAGNOSIS — Z3009 Encounter for other general counseling and advice on contraception: Secondary | ICD-10-CM

## 2018-03-15 DIAGNOSIS — Z6841 Body Mass Index (BMI) 40.0 and over, adult: Secondary | ICD-10-CM

## 2018-03-15 MED ORDER — PRENATE MINI 29-0.6-0.4-350 MG PO CAPS: 1.0000 | ORAL_CAPSULE | Freq: Every day | ORAL | 11 refills | Status: DC

## 2018-03-15 MED ORDER — IBUPROFEN 800 MG PO TABS: 800.0000 mg | ORAL_TABLET | Freq: Three times a day (TID) | ORAL | 5 refills | Status: DC | PRN

## 2018-03-15 MED ORDER — CYCLOBENZAPRINE HCL 10 MG PO TABS: 10.0000 mg | ORAL_TABLET | Freq: Three times a day (TID) | ORAL | 2 refills | Status: DC | PRN

## 2018-03-15 NOTE — Progress Notes (Signed)
Subjective:        Latoya Stark is a 23 y.o. female here for a routine exam.  Current complaints: NONE.    Personal health questionnaire:  Is patient [name], have a family history of breast and/or ovarian cancer: no Is there a family history of uterine cancer diagnosed at age < 56, gastrointestinal cancer, urinary tract cancer, family member who is a Field seismologist syndrome-associated carrier: no Is the patient overweight and hypertensive, family history of diabetes, personal history of gestational diabetes, preeclampsia or PCOS: no Is patient over 50, have PCOS,  family history of premature CHD under age 16, diabetes, smoke, have hypertension or peripheral artery disease:  no At any time, has a partner hit, kicked or otherwise hurt or frightened you?: no Over the past 2 weeks, have you felt down, depressed or hopeless?: no Over the past 2 weeks, have you felt little interest or pleasure in doing things?:no   Gynecologic History No LMP recorded. (Menstrual status: Irregular Periods). Contraception: none Last Pap: 2018. Results were: normal Last mammogram: N/A. Results were: N/A  Obstetric History OB History  Gravida Para Term Preterm AB Living  3       2    SAB TAB Ectopic Multiple Live Births  2            # Outcome Date GA Lbr Len/2nd Weight Sex Delivery Anes PTL Lv  3 Gravida           2 SAB 10/10/14          1 SAB              Birth Comments: System Generated. Please review and update pregnancy details.    Past Medical History:  Diagnosis Date  . ADHD (attention deficit hyperactivity disorder)   . Anemia   . Anxiety   . Asthma   . Depression   . Headache   . Obesity   . Seizures ([place])    "when I get too hot"  . Trichomonas infection   . UTI (lower urinary tract infection)     Past Surgical History:  Procedure Laterality Date  . ADENOIDECTOMY    . DILATION AND EVACUATION N/A 08/09/2013   Procedure: DILATATION AND EVACUATION;  Surgeon: Frederico Hamman, MD;  Location: [facility];  Service: Gynecology;  Laterality: N/A;  . ORTHOPEDIC SURGERY Left    left hand  . TONSILLECTOMY       Current Outpatient Medications:  .  acetaminophen (TYLENOL) 500 MG tablet, Take 1,000 mg by mouth every 6 (six) hours as needed for headache., Disp: , Rfl:  .  albuterol (PROVENTIL HFA;VENTOLIN HFA) 108 (90 Base) MCG/ACT inhaler, Inhale 2 puffs into the lungs every 6 (six) hours as needed for wheezing or shortness of breath., Disp: 1 Inhaler, Rfl: 0 .  promethazine (PHENERGAN) 25 MG tablet, Take 1 tablet (25 mg total) by mouth every 6 (six) hours as needed for nausea or vomiting., Disp: 30 tablet, Rfl: 2 .  cyclobenzaprine (FLEXERIL) 10 MG tablet, Take 1 tablet (10 mg total) by mouth every 8 (eight) hours as needed for muscle spasms., Disp: 30 tablet, Rfl: 2 .  ibuprofen (ADVIL,MOTRIN) 800 MG tablet, Take 1 tablet (800 mg total) by mouth every 8 (eight) hours as needed., Disp: 30 tablet, Rfl: 5 .  phenytoin (DILANTIN) 100 MG ER capsule, Take 1 capsule (100 mg total) by mouth 4 (four) times daily., Disp: 120 capsule, Rfl: 1 .  Prenat w/o A-FeCbn-Meth-FA-DHA (PRENATE MINI)  29-0.6-0.4-350 MG CAPS, Take 1 capsule by mouth daily before breakfast., Disp: 30 capsule, Rfl: 11 Allergies  Allergen Reactions  . Milk-Related Compounds Anaphylaxis    Breaks face out *can have 2% milk*  . Mushroom Extract Complex Anaphylaxis  . Penicillins Anaphylaxis    Has patient had a PCN reaction causing immediate rash, facial/tongue/throat swelling, SOB or lightheadedness with hypotension: Yes Has patient had a PCN reaction causing severe rash involving mucus membranes or skin necrosis: No Has patient had a PCN reaction that required hospitalization No Has patient had a PCN reaction occurring within the last 10 years: Yes If all of the above answers are "NO", then may proceed with Cephalosporin use.   [name] Other (See Comments)    Breaks face out  . Latex Itching and Swelling   . Sulfa Antibiotics Swelling    Social History   Tobacco Use  . Smoking status: Passive Smoke Exposure - Never Smoker  . Smokeless tobacco: Never Used  Substance Use Topics  . Alcohol use: No    Alcohol/week: 0.0 standard drinks    Family History  Problem Relation Age of Onset  . Seizures Mother   . Diabetes Maternal Grandmother   . Colon cancer Maternal Grandmother   . Hypertension Maternal Grandmother   . Clotting disorder Maternal Grandmother   . Breast cancer Maternal Grandmother       Review of Systems  Constitutional: negative for fatigue and weight loss Respiratory: negative for cough and wheezing Cardiovascular: negative for chest pain, fatigue and palpitations Gastrointestinal: negative for abdominal pain and change in bowel habits Musculoskeletal:negative for myalgias Neurological: negative for gait problems and tremors Behavioral/Psych: negative for abusive relationship, depression Endocrine: negative for temperature intolerance    Genitourinary:negative for abnormal menstrual periods, genital lesions, hot flashes, sexual problems and vaginal discharge Integument/breast: negative for breast lump, breast tenderness, nipple discharge and skin lesion(s)    Objective:       BP 109/78   Pulse 81   Ht 5\' 6"  (1.676 m)   Wt (!) 306 lb 3.2 oz (138.9 kg)   BMI 49.42 kg/m  General:   alert  Skin:   no rash or abnormalities  Lungs:   clear to auscultation bilaterally  Heart:   regular rate and rhythm, S1, S2 normal, no murmur, click, rub or gallop  Breasts:   normal without suspicious masses, skin or nipple changes or axillary nodes  Abdomen:  normal findings: no organomegaly, soft, non-tender and no hernia  Pelvis:  External genitalia: normal general appearance Urinary system: urethral meatus normal and bladder without fullness, nontender Vaginal: normal without tenderness, induration or masses Cervix: normal appearance Adnexa: normal bimanual exam Uterus:  anteverted and non-tender, normal size   Lab Review Urine pregnancy test Labs reviewed  YES Radiologic studies reviewed NO  50% of 20 min visit spent on counseling and coordination of care.   Assessment:     1. Encounter for routine gynecological examination with Papanicolaou smear of cervix Rx: - Cytology - PAP( [facility]) - Cervicovaginal ancillary only( [place]) - Hepatitis B surface antigen - Hepatitis C antibody - HIV Antibody (routine testing w rflx) - RPR  2. Encounter for other general counseling and advice on contraception - declines  3. Class 3 severe obesity due to excess calories without serious comorbidity with body mass index (BMI) of 45.0 to 49.9 in adult [facility]) Rx: - TSH  4. Backache symptom Rx: - cyclobenzaprine (FLEXERIL) 10 MG tablet; Take 1 tablet (10 mg total)  by mouth every 8 (eight) hours as needed for muscle spasms.  Dispense: 30 tablet; Refill: 2 - ibuprofen (ADVIL,MOTRIN) 800 MG tablet; Take 1 tablet (800 mg total) by mouth every 8 (eight) hours as needed.  Dispense: 30 tablet; Refill: 5  5. Routine adult health maintenance Rx: - Prenat w/o A-FeCbn-Meth-FA-DHA (PRENATE MINI) 29-0.6-0.4-350 MG CAPS; Take 1 capsule by mouth daily before breakfast.  Dispense: 30 capsule; Refill: 11 - Ambulatory referral to Internal Medicine    Plan:    Education reviewed: calcium supplements, depression evaluation, low fat, low cholesterol diet, safe sex/STD prevention, self breast exams and weight bearing exercise. Contraception: none. Follow up in: 1 year.   Meds ordered this encounter  Medications  . cyclobenzaprine (FLEXERIL) 10 MG tablet    Sig: Take 1 tablet (10 mg total) by mouth every 8 (eight) hours as needed for muscle spasms.    Dispense:  30 tablet    Refill:  2  . ibuprofen (ADVIL,MOTRIN) 800 MG tablet    Sig: Take 1 tablet (800 mg total) by mouth every 8 (eight) hours as needed.    Dispense:  30 tablet    Refill:  5  . Prenat w/o  A-FeCbn-Meth-FA-DHA (PRENATE MINI) 29-0.6-0.4-350 MG CAPS    Sig: Take 1 capsule by mouth daily before breakfast.    Dispense:  30 capsule    Refill:  11   Orders Placed This Encounter  Procedures  . Hepatitis B surface antigen  . Hepatitis C antibody  . HIV Antibody (routine testing w rflx)  . RPR  . TSH  . Ambulatory referral to Internal Medicine    Referral Priority:   Routine    Referral Type:   Consultation    Referral Reason:   Specialty Services Required    Requested Specialty:   Internal Medicine    Number of Visits Requested:   1    Shelly Bombard MD 03-15-2018

## 2018-03-15 NOTE — Progress Notes (Signed)
Pt is here for annual exam. Last pap 11/02/16, normal.

## 2018-03-16 LAB — HIV ANTIBODY (ROUTINE TESTING W REFLEX): HIV Screen 4th Generation wRfx: NONREACTIVE

## 2018-03-16 LAB — TSH: TSH: 1.77 u[IU]/mL (ref 0.450–4.500)

## 2018-03-16 LAB — RPR: RPR Ser Ql: NONREACTIVE

## 2018-03-16 LAB — HEPATITIS C ANTIBODY: Hep C Virus Ab: <0.1 s/co ratio (ref 0.0–0.9)

## 2018-03-16 LAB — HEPATITIS B SURFACE ANTIGEN: Hepatitis B Surface Ag: NEGATIVE

## 2018-03-18 ENCOUNTER — Other Ambulatory Visit: Payer: Self-pay | Admitting: Obstetrics

## 2018-03-18 DIAGNOSIS — B3731 Acute candidiasis of vulva and vagina: Secondary | ICD-10-CM

## 2018-03-18 DIAGNOSIS — B373 Candidiasis of vulva and vagina: Secondary | ICD-10-CM

## 2018-03-18 LAB — CERVICOVAGINAL ANCILLARY ONLY
Bacterial vaginitis: NEGATIVE
Candida vaginitis: POSITIVE — AB
Chlamydia: NEGATIVE
Neisseria Gonorrhea: NEGATIVE
Trichomonas: NEGATIVE

## 2018-03-18 LAB — CYTOLOGY - PAP: Diagnosis: NEGATIVE

## 2018-03-18 MED ORDER — FLUCONAZOLE 150 MG PO TABS: 150.0000 mg | ORAL_TABLET | Freq: Once | ORAL | 0 refills | Status: AC

## 2018-03-19 ENCOUNTER — Other Ambulatory Visit: Payer: Self-pay | Admitting: Obstetrics

## 2018-03-19 DIAGNOSIS — B373 Candidiasis of vulva and vagina: Secondary | ICD-10-CM

## 2018-03-19 DIAGNOSIS — B3731 Acute candidiasis of vulva and vagina: Secondary | ICD-10-CM

## 2018-03-19 MED ORDER — FLUCONAZOLE 150 MG PO TABS: 150.0000 mg | ORAL_TABLET | Freq: Once | ORAL | 0 refills | Status: AC

## 2018-06-12 ENCOUNTER — Encounter (HOSPITAL_COMMUNITY): Payer: Self-pay

## 2018-06-12 ENCOUNTER — Emergency Department (HOSPITAL_COMMUNITY)
Admission: EM | Admit: 2018-06-12 | Discharge: 2018-06-12 | Disposition: A | Discharge status: Home / Self Care | Payer: Medicaid Other | Attending: Emergency Medicine | Admitting: Emergency Medicine

## 2018-06-12 DIAGNOSIS — Z91040 Latex allergy status: Secondary | ICD-10-CM | POA: Insufficient documentation

## 2018-06-12 DIAGNOSIS — Z79899 Other long term (current) drug therapy: Secondary | ICD-10-CM | POA: Insufficient documentation

## 2018-06-12 DIAGNOSIS — Z7722 Contact with and (suspected) exposure to environmental tobacco smoke (acute) (chronic): Secondary | ICD-10-CM | POA: Diagnosis not present

## 2018-06-12 DIAGNOSIS — R569 Unspecified convulsions: Secondary | ICD-10-CM | POA: Insufficient documentation

## 2018-06-12 DIAGNOSIS — J45909 Unspecified asthma, uncomplicated: Secondary | ICD-10-CM | POA: Diagnosis not present

## 2018-06-12 LAB — PHENOBARBITAL LEVEL: Phenobarbital: <5 ug/mL — ABNORMAL LOW (ref 15.0–30.0)

## 2018-06-12 LAB — I-STAT BETA HCG BLOOD, ED (MC, WL, AP ONLY): I-stat hCG, quantitative: <5 m[IU]/mL (ref <5)

## 2018-06-12 LAB — PHENYTOIN LEVEL, TOTAL: Phenytoin Lvl: <2.5 ug/mL — ABNORMAL LOW (ref 10.0–20.0)

## 2018-06-12 LAB — CBG MONITORING, ED: Glucose-Capillary: 99 mg/dL (ref 70–99)

## 2018-06-12 MED ORDER — SODIUM CHLORIDE 0.9 % IV SOLN
1000.0000 mg | Freq: Once | INTRAVENOUS | Status: AC
  Administered 2018-06-12: 1000 mg via INTRAVENOUS
  Filled 2018-06-12: qty 20

## 2018-06-12 NOTE — ED Notes (Signed)
Patient still post ictal from seizure.A/Ox1.Patient oriented to person. Patient stated it was 2019. Patient unsure where she is or who the president is.

## 2018-06-12 NOTE — Discharge Instructions (Signed)
As discussed, it is very important that you continue to take your medication as directed, specifically her antiseizure medications.  Return here for concerning changes in your condition.

## 2018-06-12 NOTE — ED Provider Notes (Signed)
[facility] DEPT Provider Note   CSN: [id number] Arrival date & time: 06/12/18  1424    History   Chief Complaint Chief Complaint  Patient presents with  . Seizures    HPI Latoya Stark is a 23 y.o. female.     HPI With multiple medical issues including seizure disorder presents after a reported seizure. She notes that she has headaches daily, and currently has typical pain for her, diffuse, sore, severe. She denies other pain, denies weakness in any extremity, denies confusion, though she acknowledges she is still recovering from her episode. She states that she has been taking her seizure medication regularly, denies changes in dosage. However, the patient is unaware of her actual medication regimen. Past Medical History:  Diagnosis Date  . ADHD (attention deficit hyperactivity disorder)   . Anemia   . Anxiety   . Asthma   . Depression   . Headache   . Obesity   . Seizures ([place])    "when I get too hot"  . Trichomonas infection   . UTI (lower urinary tract infection)     Patient Active Problem List   Diagnosis Date Noted  . Supervision of normal pregnancy, antepartum 03/19/2017  . Constipation 02/24/2014  . CN (constipation) 12/23/2013  . Gonorrhea in female 10/05/2013  . BV (bacterial vaginosis) 09/22/2013  . Candidiasis of vulva and vagina 09/22/2013    Past Surgical History:  Procedure Laterality Date  . ADENOIDECTOMY    . DILATION AND EVACUATION N/A 08/09/2013   Procedure: DILATATION AND EVACUATION;  Surgeon: Frederico Hamman, MD;  Location: [facility];  Service: Gynecology;  Laterality: N/A;  . ORTHOPEDIC SURGERY Left    left hand  . TONSILLECTOMY       OB History    Gravida  3   Para      Term      Preterm      AB  2   Living        SAB  2   TAB      Ectopic      Multiple      Live Births               Home Medications    Prior to Admission medications   Medication Sig Start Date End Date  Taking? Authorizing Provider  acetaminophen (TYLENOL) 500 MG tablet Take 1,000 mg by mouth every 6 (six) hours as needed for headache.    [provider]  albuterol (PROVENTIL HFA;VENTOLIN HFA) 108 (90 Base) MCG/ACT inhaler Inhale 2 puffs into the lungs every 6 (six) hours as needed for wheezing or shortness of breath. 08/20/17   Laban Emperor, PA-C  cyclobenzaprine (FLEXERIL) 10 MG tablet Take 1 tablet (10 mg total) by mouth every 8 (eight) hours as needed for muscle spasms. 03/15/18   Shelly Bombard, MD  ibuprofen (ADVIL,MOTRIN) 800 MG tablet Take 1 tablet (800 mg total) by mouth every 8 (eight) hours as needed. 03/15/18   Shelly Bombard, MD  phenytoin (DILANTIN) 100 MG ER capsule Take 1 capsule (100 mg total) by mouth 4 (four) times daily. 11/25/17 01/24/18  Recardo Evangelist, PA-C  Prenat w/o A-FeCbn-Meth-FA-DHA (PRENATE MINI) 29-0.6-0.4-350 MG CAPS Take 1 capsule by mouth daily before breakfast. 03/15/18   Shelly Bombard, MD  promethazine (PHENERGAN) 25 MG tablet Take 1 tablet (25 mg total) by mouth every 6 (six) hours as needed for nausea or vomiting. 03/19/17   Shelly Bombard, MD  Family History Family History  Problem Relation Age of Onset  . Seizures Mother   . Diabetes Maternal Grandmother   . Colon cancer Maternal Grandmother   . Hypertension Maternal Grandmother   . Clotting disorder Maternal Grandmother   . Breast cancer Maternal Grandmother     Social History Social History   Tobacco Use  . Smoking status: Passive Smoke Exposure - Never Smoker  . Smokeless tobacco: Never Used  Substance Use Topics  . Alcohol use: No    Alcohol/week: 0.0 standard drinks  . Drug use: No     Allergies   Milk-related compounds; Mushroom extract complex; Penicillins; Cheese; Latex; and Sulfa antibiotics   Review of Systems Review of Systems  Constitutional:       Per HPI, otherwise negative  HENT:       Per HPI, otherwise negative  Respiratory:       Per HPI,  otherwise negative  Cardiovascular:       Per HPI, otherwise negative  Gastrointestinal: Negative for vomiting.  Endocrine:       Negative aside from HPI  Genitourinary:       Neg aside from HPI   Musculoskeletal:       Per HPI, otherwise negative  Skin: Negative.   Neurological: Positive for seizures. Negative for syncope.     Physical Exam Updated Vital Signs BP 123/80   Pulse 97   Temp 99.3 F (37.4 C) (Oral)   Resp (!) 21   Wt 127 kg   SpO2 99%   BMI 45.19 kg/m   Physical Exam Vitals signs and nursing note reviewed.  Constitutional:      General: She is not in acute distress.    Appearance: She is well-developed.     Comments: Obese young female sleeping, but oriented appropriately  HENT:     Head: Normocephalic and atraumatic.  Eyes:     Conjunctiva/sclera: Conjunctivae normal.  Cardiovascular:     Rate and Rhythm: Normal rate and regular rhythm.  Pulmonary:     Effort: Pulmonary effort is normal. No respiratory distress.     Breath sounds: Normal breath sounds. No stridor.  Abdominal:     General: There is no distension.  Skin:    General: Skin is warm and dry.  Neurological:     General: No focal deficit present.     Mental Status: She is alert and oriented to person, place, and time.     Cranial Nerves: No cranial nerve deficit.     Coordination: Coordination normal.      ED Treatments / Results  Labs (all labs ordered are listed, but only abnormal results are displayed) Labs Reviewed  PHENYTOIN LEVEL, TOTAL - Abnormal; Notable for the following components:      Result Value   Phenytoin Lvl <2.5 (*)    All other components within normal limits  PHENOBARBITAL LEVEL  CBG MONITORING, ED  I-STAT BETA HCG BLOOD, ED ([facility], [facility], [facility] ONLY)    EKG None  Radiology No results found.  Procedures Procedures (including critical care time)  Medications Ordered in ED Medications  phenytoin (DILANTIN) 1,000 mg in sodium chloride 0.9 % 250 mL IVPB  (1,000 mg Intravenous New Bag/Given 06/12/18 1756)     Initial Impression / Assessment and Plan / ED Course  I have reviewed the triage vital signs and the nursing notes.  Pertinent labs & imaging results that were available during my care of the patient were reviewed by me and considered in my  medical decision making (see chart for details).        6:39 PM Patient in no distress, receiving IV Dilantin. She has had no additional seizure activity, is awake, alert, hemodynamically unremarkable. On repeat exam she did acknowledge that she had missed 2 maybe 3 or more days of her Dilantin, and she is switching her housing status. With otherwise reassuring findings, labs, vitals, no distress, no evidence for trauma, patient will be discharged, encouraged to take her Dilantin, follow-up with primary care.  Final Clinical Impressions(s) / ED Diagnoses   Final diagnoses:  Seizure [facility])    ED Discharge Orders    None       Carmin Muskrat, MD 06/12/18 1840

## 2018-06-12 NOTE — ED Notes (Signed)
Bed: WA15 Expected date:  Expected time:  Means of arrival:  Comments: EMS- seizure 

## 2018-06-12 NOTE — ED Notes (Signed)
Patient A&O x4 and ambulatory at discharge. Discharge paperwork reviewed and patient given opportunity to ask questions.

## 2018-06-12 NOTE — ED Triage Notes (Addendum)
Patient arrived via [facility] from her car at [facility].   Manager of [employer] called. When EMS arrived patients boyfriend was standing by. Boyfriend states when patient gets hot and over heated she usually has a seizure.   Mom states patient took her seizure medication this morning.   Hx. Seizures   A/Ox1  Post ictal

## 2018-07-16 ENCOUNTER — Other Ambulatory Visit: Payer: Self-pay

## 2018-07-16 ENCOUNTER — Ambulatory Visit (INDEPENDENT_AMBULATORY_CARE_PROVIDER_SITE_OTHER): Payer: Medicaid Other

## 2018-07-16 VITALS — BP 127/83 | HR 94 | Wt 305.8 lb

## 2018-07-16 DIAGNOSIS — Z3201 Encounter for pregnancy test, result positive: Secondary | ICD-10-CM

## 2018-07-16 DIAGNOSIS — Z3200 Encounter for pregnancy test, result unknown: Secondary | ICD-10-CM | POA: Diagnosis not present

## 2018-07-16 LAB — POCT URINE PREGNANCY: Preg Test, Ur: POSITIVE — AB

## 2018-07-16 NOTE — Progress Notes (Signed)
Pt is here for Urine Pregnancy Test. UPT is positive. LMP 05/31/18, EDD 03/07/19. Patient instructed to make new OB appointment around the 1st week of August when she is around [redacted] weeks gestation.

## 2018-07-22 ENCOUNTER — Telehealth: Payer: Self-pay | Admitting: *Deleted

## 2018-07-22 NOTE — Telephone Encounter (Signed)
Pt called to office stating she had been having some upper abd pain/aches, mostly at night. Pt states she has had a few days of diarrhea.  Pt advised to stay well hydrated and continue to monitor GI symptoms.  Pt made awae the discomfort she is having seems more GI related and not pregnancy related. Pt also advised she may take an acid reducer at night and see if she has any relief of symptoms. Pt states understanding.    Pt also request something for nausea, would like Rx. Pt states she does fine throughout the day but nausea is really worse first thing in the morning.  Please advise and send Rx if approved.

## 2018-07-25 ENCOUNTER — Other Ambulatory Visit: Payer: Self-pay

## 2018-07-25 ENCOUNTER — Encounter (HOSPITAL_COMMUNITY): Payer: Self-pay

## 2018-07-25 ENCOUNTER — Telehealth: Payer: Self-pay | Admitting: *Deleted

## 2018-07-25 ENCOUNTER — Inpatient Hospital Stay (HOSPITAL_COMMUNITY)
Admission: AD | Admit: 2018-07-25 | Discharge: 2018-07-25 | Disposition: A | Discharge status: Home / Self Care | Payer: Medicaid Other | Attending: Obstetrics and Gynecology | Admitting: Obstetrics and Gynecology

## 2018-07-25 ENCOUNTER — Inpatient Hospital Stay (HOSPITAL_COMMUNITY): Payer: Medicaid Other

## 2018-07-25 DIAGNOSIS — Z3A01 Less than 8 weeks gestation of pregnancy: Secondary | ICD-10-CM | POA: Diagnosis not present

## 2018-07-25 DIAGNOSIS — J45909 Unspecified asthma, uncomplicated: Secondary | ICD-10-CM | POA: Diagnosis not present

## 2018-07-25 DIAGNOSIS — Z91011 Allergy to milk products: Secondary | ICD-10-CM | POA: Diagnosis not present

## 2018-07-25 DIAGNOSIS — Z880 Allergy status to penicillin: Secondary | ICD-10-CM | POA: Insufficient documentation

## 2018-07-25 DIAGNOSIS — Z7722 Contact with and (suspected) exposure to environmental tobacco smoke (acute) (chronic): Secondary | ICD-10-CM | POA: Insufficient documentation

## 2018-07-25 DIAGNOSIS — O99511 Diseases of the respiratory system complicating pregnancy, first trimester: Secondary | ICD-10-CM | POA: Diagnosis not present

## 2018-07-25 DIAGNOSIS — R569 Unspecified convulsions: Secondary | ICD-10-CM | POA: Insufficient documentation

## 2018-07-25 DIAGNOSIS — Z800 Family history of malignant neoplasm of digestive organs: Secondary | ICD-10-CM | POA: Insufficient documentation

## 2018-07-25 DIAGNOSIS — Z833 Family history of diabetes mellitus: Secondary | ICD-10-CM | POA: Insufficient documentation

## 2018-07-25 DIAGNOSIS — Z882 Allergy status to sulfonamides status: Secondary | ICD-10-CM | POA: Diagnosis not present

## 2018-07-25 DIAGNOSIS — N939 Abnormal uterine and vaginal bleeding, unspecified: Secondary | ICD-10-CM

## 2018-07-25 DIAGNOSIS — Z803 Family history of malignant neoplasm of breast: Secondary | ICD-10-CM | POA: Insufficient documentation

## 2018-07-25 DIAGNOSIS — Z79899 Other long term (current) drug therapy: Secondary | ICD-10-CM | POA: Insufficient documentation

## 2018-07-25 DIAGNOSIS — B373 Candidiasis of vulva and vagina: Secondary | ICD-10-CM | POA: Insufficient documentation

## 2018-07-25 DIAGNOSIS — Z91018 Allergy to other foods: Secondary | ICD-10-CM | POA: Diagnosis not present

## 2018-07-25 DIAGNOSIS — Z820 Family history of epilepsy and other diseases of the nervous system: Secondary | ICD-10-CM | POA: Insufficient documentation

## 2018-07-25 DIAGNOSIS — O98811 Other maternal infectious and parasitic diseases complicating pregnancy, first trimester: Secondary | ICD-10-CM | POA: Diagnosis present

## 2018-07-25 DIAGNOSIS — R1032 Left lower quadrant pain: Secondary | ICD-10-CM

## 2018-07-25 DIAGNOSIS — O99211 Obesity complicating pregnancy, first trimester: Secondary | ICD-10-CM | POA: Diagnosis not present

## 2018-07-25 DIAGNOSIS — Z91040 Latex allergy status: Secondary | ICD-10-CM | POA: Diagnosis not present

## 2018-07-25 DIAGNOSIS — O26891 Other specified pregnancy related conditions, first trimester: Secondary | ICD-10-CM

## 2018-07-25 DIAGNOSIS — N898 Other specified noninflammatory disorders of vagina: Secondary | ICD-10-CM

## 2018-07-25 DIAGNOSIS — O99351 Diseases of the nervous system complicating pregnancy, first trimester: Secondary | ICD-10-CM | POA: Insufficient documentation

## 2018-07-25 LAB — CBC WITH DIFFERENTIAL/PLATELET
Abs Immature Granulocytes: 0.04 10*3/uL (ref 0.00–0.07)
Basophils Absolute: 0 10*3/uL (ref 0.0–0.1)
Basophils Relative: 0 %
Eosinophils Absolute: 0.2 10*3/uL (ref 0.0–0.5)
Eosinophils Relative: 2 %
HCT: 37.7 % (ref 36.0–46.0)
Hemoglobin: 12.4 g/dL (ref 12.0–15.0)
Immature Granulocytes: 1 %
Lymphocytes Relative: 24 %
Lymphs Abs: 2 10*3/uL (ref 0.7–4.0)
MCH: 28.2 pg (ref 26.0–34.0)
MCHC: 32.9 g/dL (ref 30.0–36.0)
MCV: 85.9 fL (ref 80.0–100.0)
Monocytes Absolute: 0.7 10*3/uL (ref 0.1–1.0)
Monocytes Relative: 8 %
Neutro Abs: 5.6 10*3/uL (ref 1.7–7.7)
Neutrophils Relative %: 65 %
Platelets: 364 10*3/uL (ref 150–400)
RBC: 4.39 MIL/uL (ref 3.87–5.11)
RDW: 13.2 % (ref 11.5–15.5)
WBC: 8.5 10*3/uL (ref 4.0–10.5)
nRBC: 0 % (ref 0.0–0.2)

## 2018-07-25 LAB — URINALYSIS, ROUTINE W REFLEX MICROSCOPIC
Bilirubin Urine: NEGATIVE
Glucose, UA: NEGATIVE mg/dL
Ketones, ur: NEGATIVE mg/dL
Nitrite: NEGATIVE
Protein, ur: NEGATIVE mg/dL
Specific Gravity, Urine: 1.012 (ref 1.005–1.030)
pH: 6 (ref 5.0–8.0)

## 2018-07-25 LAB — WET PREP, GENITAL
Clue Cells Wet Prep HPF POC: NONE SEEN
Sperm: NONE SEEN
Trich, Wet Prep: NONE SEEN

## 2018-07-25 LAB — HCG, QUANTITATIVE, PREGNANCY: hCG, Beta Chain, Quant, S: 191084 m[IU]/mL — ABNORMAL HIGH (ref <5)

## 2018-07-25 MED ORDER — TERCONAZOLE 0.4 % VA CREA: 1.0000 | TOPICAL_CREAM | Freq: Every day | VAGINAL | 0 refills | Status: DC

## 2018-07-25 NOTE — Discharge Instructions (Signed)

## 2018-07-25 NOTE — MAU Provider Note (Signed)
History     CSN: [id number]  Arrival date and time: 07/25/18 1500   First Provider Initiated Contact with Patient 07/25/18 1612      Chief Complaint  Patient presents with  . Abdominal Pain   HPI SALMA WALROND is a 23 y.o. G4P0030 at [redacted]w[redacted]d who presents to MAU with chief complaints of LLQ abdominal pain and vaginal irritation and spotting.   Vaginal Spotting This is a new problem, onset two days ago after patient fell onto her abdomen following a seizure at home. She denies heavy vaginal bleeding but states she has seen spotting when she wipes after voiding on two different occasions.  LLQ Pain and Vaginal Irritation These are new problems, onset today. Patient reports "mild cramps" in her LLQ as well as external irritation. She has not taken medication or tried other treatments for these problems.  Seizure Activity Patient reports initial diagnosis at age 27. She didn't have any seizures between 2010 and 2018. She states she has not seen a Neurologist in the past six months and she stopped taking her Dilantin about two weeks ago. She reports a seizure at home two days ago in which her husband tells her she drove home, responded to questions, then fell onto her abdomen. She has no memory of these events. She reports stress and heat as consistent triggers. She has coordinated an early [facility] at [place] on 07/30/18 to determine how to manage her seizures in pregnancy.   She denies abdominal tenderness, abnormal vaginal discharge, dysuria, fever or recent illness.   OB History    Gravida  4   Para      Term      Preterm      AB  3   Living        SAB  3   TAB      Ectopic      Multiple      Live Births              Past Medical History:  Diagnosis Date  . ADHD (attention deficit hyperactivity disorder)   . Anemia   . Anxiety   . Asthma   . Depression   . Headache   . Obesity   . Seizures ([place])    "when I get too hot"  . Trichomonas infection   .  UTI (lower urinary tract infection)     Past Surgical History:  Procedure Laterality Date  . ADENOIDECTOMY    . DILATION AND EVACUATION N/A 08/09/2013   Procedure: DILATATION AND EVACUATION;  Surgeon: Frederico Hamman, MD;  Location: [facility];  Service: Gynecology;  Laterality: N/A;  . ORTHOPEDIC SURGERY Left    left hand  . TONSILLECTOMY      Family History  Problem Relation Age of Onset  . Seizures Mother   . Diabetes Maternal Grandmother   . Colon cancer Maternal Grandmother   . Hypertension Maternal Grandmother   . Clotting disorder Maternal Grandmother   . Breast cancer Maternal Grandmother     Social History   Tobacco Use  . Smoking status: Passive Smoke Exposure - Never Smoker  . Smokeless tobacco: Never Used  Substance Use Topics  . Alcohol use: No    Alcohol/week: 0.0 standard drinks  . Drug use: No    Allergies:  Allergies  Allergen Reactions  . Milk-Related Compounds Anaphylaxis    Breaks face out *can have 2% milk*  . Mushroom Extract Complex Anaphylaxis  . Penicillins Anaphylaxis  Has patient had a PCN reaction causing immediate rash, facial/tongue/throat swelling, SOB or lightheadedness with hypotension: Yes Has patient had a PCN reaction causing severe rash involving mucus membranes or skin necrosis: No Has patient had a PCN reaction that required hospitalization No Has patient had a PCN reaction occurring within the last 10 years: Yes If all of the above answers are "NO", then may proceed with Cephalosporin use.   [name] Other (See Comments)    Breaks face out  . Latex Itching and Swelling  . Sulfa Antibiotics Swelling    Medications Prior to Admission  Medication Sig Dispense Refill Last Dose  . acetaminophen (TYLENOL) 500 MG tablet Take 1,000 mg by mouth every 6 (six) hours as needed for headache.     . albuterol (PROVENTIL HFA;VENTOLIN HFA) 108 (90 Base) MCG/ACT inhaler Inhale 2 puffs into the lungs every 6 (six) hours as needed for  wheezing or shortness of breath. 1 Inhaler 0   . cyclobenzaprine (FLEXERIL) 10 MG tablet Take 1 tablet (10 mg total) by mouth every 8 (eight) hours as needed for muscle spasms. 30 tablet 2   . ibuprofen (ADVIL,MOTRIN) 800 MG tablet Take 1 tablet (800 mg total) by mouth every 8 (eight) hours as needed. 30 tablet 5   . phenytoin (DILANTIN) 100 MG ER capsule Take 1 capsule (100 mg total) by mouth 4 (four) times daily. 120 capsule 1   . Prenat w/o A-FeCbn-Meth-FA-DHA (PRENATE MINI) 29-0.6-0.4-350 MG CAPS Take 1 capsule by mouth daily before breakfast. 30 capsule 11   . promethazine (PHENERGAN) 25 MG tablet Take 1 tablet (25 mg total) by mouth every 6 (six) hours as needed for nausea or vomiting. 30 tablet 2     Review of Systems  Constitutional: Negative for chills, fatigue and fever.  Respiratory: Negative for shortness of breath.   Gastrointestinal: Positive for abdominal pain.  Genitourinary: Positive for vaginal bleeding. Negative for difficulty urinating, flank pain, vaginal discharge and vaginal pain.  Musculoskeletal: Negative for back pain.  Neurological: Negative for headaches.  All other systems reviewed and are negative.  Physical Exam   Blood pressure 113/67, pulse 81, temperature 98.5 F (36.9 C), temperature source Oral, resp. rate 18, weight (!) 138.3 kg, last menstrual period 05/31/2018, SpO2 100 %.  Physical Exam  Nursing note and vitals reviewed. Constitutional: She is oriented to person, place, and time. She appears well-developed and well-nourished.  Cardiovascular: Normal rate.  Respiratory: Effort normal.  GI: Soft. She exhibits no distension. There is no abdominal tenderness. There is no rebound and no guarding.  Genitourinary:    Vaginal discharge present.     Genitourinary Comments: Thick white vaginal discharge noted on swab collection   Musculoskeletal: Normal range of motion.  Neurological: She is alert and oriented to person, place, and time.  Skin: Skin is  warm and dry.  Psychiatric: She has a normal mood and affect. Her behavior is normal. Judgment and thought content normal.    MAU Course/MDM  Procedures  --Swabs collected via blind swab --Patient declines pain medicine in MAU  Patient Vitals for the past 24 hrs:  BP Temp Temp src Pulse Resp SpO2 Weight  07/25/18 1751 107/62 - - 91 - - -  07/25/18 1547 113/67 98.5 F (36.9 C) Oral 81 18 100 % (!) 138.3 kg    Results for orders placed or performed during the hospital encounter of 07/25/18 (from the past 24 hour(s))  Urinalysis, Routine w reflex microscopic     Status: Abnormal  Collection Time: 07/25/18  3:55 PM  Result Value Ref Range   Color, Urine YELLOW YELLOW   APPearance HAZY (A) CLEAR   Specific Gravity, Urine 1.012 1.005 - 1.030   pH 6.0 5.0 - 8.0   Glucose, UA NEGATIVE NEGATIVE mg/dL   Hgb urine dipstick SMALL (A) NEGATIVE   Bilirubin Urine NEGATIVE NEGATIVE   Ketones, ur NEGATIVE NEGATIVE mg/dL   Protein, ur NEGATIVE NEGATIVE mg/dL   Nitrite NEGATIVE NEGATIVE   Leukocytes,Ua SMALL (A) NEGATIVE   RBC / HPF 0-5 0 - 5 RBC/hpf   WBC, UA 0-5 0 - 5 WBC/hpf   Bacteria, UA MANY (A) NONE SEEN   Squamous Epithelial / LPF 6-10 0 - 5   Mucus PRESENT    Hyaline Casts, UA PRESENT   CBC with Differential/Platelet     Status: None   Collection Time: 07/25/18  4:36 PM  Result Value Ref Range   WBC 8.5 4.0 - 10.5 K/uL   RBC 4.39 3.87 - 5.11 MIL/uL   Hemoglobin 12.4 12.0 - 15.0 g/dL   HCT 37.7 36.0 - 46.0 %   MCV 85.9 80.0 - 100.0 fL   MCH 28.2 26.0 - 34.0 pg   MCHC 32.9 30.0 - 36.0 g/dL   RDW 13.2 11.5 - 15.5 %   Platelets 364 150 - 400 K/uL   nRBC 0.0 0.0 - 0.2 %   Neutrophils Relative % 65 %   Neutro Abs 5.6 1.7 - 7.7 K/uL   Lymphocytes Relative 24 %   Lymphs Abs 2.0 0.7 - 4.0 K/uL   Monocytes Relative 8 %   Monocytes Absolute 0.7 0.1 - 1.0 K/uL   Eosinophils Relative 2 %   Eosinophils Absolute 0.2 0.0 - 0.5 K/uL   Basophils Relative 0 %   Basophils Absolute 0.0  0.0 - 0.1 K/uL   Immature Granulocytes 1 %   Abs Immature Granulocytes 0.04 0.00 - 0.07 K/uL  hCG, quantitative, pregnancy     Status: Abnormal   Collection Time: 07/25/18  4:36 PM  Result Value Ref Range   hCG, Beta Chain, Quant, S 191,084 (H) <5 mIU/mL  Wet prep, genital     Status: Abnormal   Collection Time: 07/25/18  4:38 PM   Specimen: Vaginal  Result Value Ref Range   Yeast Wet Prep HPF POC PRESENT (A) NONE SEEN   Trich, Wet Prep NONE SEEN NONE SEEN   Clue Cells Wet Prep HPF POC NONE SEEN NONE SEEN   WBC, Wet Prep HPF POC FEW (A) NONE SEEN   Sperm NONE SEEN    US Ob Comp Less 14 Wks  Result Date: 07/25/2018 CLINICAL DATA:  Left lower quadrant pain, spotting EXAM: OBSTETRIC <14 WK ULTRASOUND TECHNIQUE: Transabdominal ultrasound was performed for evaluation of the gestation as well as the maternal uterus and adnexal regions. COMPARISON:  None. FINDINGS: Intrauterine gestational sac: Single Yolk sac:  Visualized Embryo:  Visualized Cardiac Activity: Visualized Heart Rate: 157 bpm MSD:    mm    w     d CRL:   17.9 mm   8 w 1 d                  [place] EDC: 03/05/2019 Subchorionic hemorrhage:  None visualized. Maternal uterus/adnexae: No adnexal mass or free fluid. IMPRESSION: Eight week 1 day intrauterine pregnancy. Fetal heart rate 157 beats per minute. No acute maternal findings. Electronically Signed   By: Rolm Baptise M.D.   On: 07/25/2018 17:35    Meds  ordered this encounter  Medications  . terconazole (TERAZOL 7) 0.4 % vaginal cream    Sig: Place 1 applicator vaginally at bedtime. Use for seven days    Dispense:  45 g    Refill:  0    Order Specific Question:   Supervising Provider    Answer:   Donnamae Jude [[id number]]    Assessment and Plan  --23 y.o. G4P0030 at [redacted]w[redacted]d by LMP --Vulvovaginal Candidiasis, rx to pharmacy --Encouraged to journal seizure activity to assist with assessing extent of return of seizure activity --Discharge home in stable condition  F/U: New OB  07/30/18  Darlina Rumpf, [place] 07/25/2018, 6:13 PM

## 2018-07-25 NOTE — MAU Note (Signed)
Pt noted at registration desk ~ 1505 when called to lobby for screaming pt.  Pt was standing, alert, no apparent distress. Her dr told her to come in a couple days ago.  Remembers taking the kids to the pool (Mon or Tues), doesn't remember getting home.  When she got home, her husband said she looked drained, asked her if she was ok, she said yes, then fell out and had a seizure. Has a hx of seizures. Yesterday, when she wiped herself, she noted spotting,  Seemed a little irritated down there.  Spotted all day then it stopped.  Called her dr, due to her hx, he wanted her to come get checked out.  No pain or bleeding currently.   Was having pain in lower part of stomach early this morning.

## 2018-07-25 NOTE — Telephone Encounter (Signed)
Pt called in to office stating she had small amount of bleeding x 2 days.  Pt denies any recent intercourse. Pt states she has had some small pelvic pains. Pt states she was also told by her husband that she had a seizure x 1 day ago and landed on her stomach.  Pt states she does not know exactly how she fell but was on her stomach during seizure. Pt states she does not have a current Neurologist and has been taking Dilantin in the past. Pt states she has stopped taking medication since learning of pregnancy.  Pt was advised to be seen at hospital for evaluation due to seizure activity and spotting in early pregnancy.   Pt made aware I will check with provider in office today to determine if earlier appt needed for medication management of seizures.    Pt states understanding.     Reviewed with Dr Rosana Hoes, she recommends sooner appt due to need for Neurology and medication management due to seizures. Pt has been rescheduled and made aware of appt change.

## 2018-07-25 NOTE — Telephone Encounter (Signed)
error 

## 2018-07-30 ENCOUNTER — Encounter: Payer: Self-pay | Admitting: Obstetrics and Gynecology

## 2018-07-30 ENCOUNTER — Ambulatory Visit (INDEPENDENT_AMBULATORY_CARE_PROVIDER_SITE_OTHER): Payer: Medicaid Other | Admitting: Obstetrics and Gynecology

## 2018-07-30 ENCOUNTER — Other Ambulatory Visit (HOSPITAL_COMMUNITY)
Admission: RE | Admit: 2018-07-30 | Discharge: 2018-07-30 | Disposition: A | Discharge status: Home / Self Care | Payer: Medicaid Other | Source: Ambulatory Visit | Attending: Obstetrics and Gynecology | Admitting: Obstetrics and Gynecology

## 2018-07-30 ENCOUNTER — Ambulatory Visit (INDEPENDENT_AMBULATORY_CARE_PROVIDER_SITE_OTHER): Payer: Medicaid Other

## 2018-07-30 ENCOUNTER — Other Ambulatory Visit: Payer: Self-pay

## 2018-07-30 DIAGNOSIS — O0990 Supervision of high risk pregnancy, unspecified, unspecified trimester: Secondary | ICD-10-CM | POA: Insufficient documentation

## 2018-07-30 DIAGNOSIS — Z87898 Personal history of other specified conditions: Secondary | ICD-10-CM

## 2018-07-30 DIAGNOSIS — O99210 Obesity complicating pregnancy, unspecified trimester: Secondary | ICD-10-CM

## 2018-07-30 DIAGNOSIS — O0991 Supervision of high risk pregnancy, unspecified, first trimester: Secondary | ICD-10-CM | POA: Diagnosis not present

## 2018-07-30 DIAGNOSIS — O99211 Obesity complicating pregnancy, first trimester: Secondary | ICD-10-CM

## 2018-07-30 DIAGNOSIS — Z3A08 8 weeks gestation of pregnancy: Secondary | ICD-10-CM

## 2018-07-30 MED ORDER — BLOOD PRESSURE KIT: PACK | 0 refills | Status: DC

## 2018-07-30 MED ORDER — DOXYLAMINE-PYRIDOXINE 10-10 MG PO TBEC: 2.0000 | DELAYED_RELEASE_TABLET | Freq: Every day | ORAL | 5 refills | Status: DC

## 2018-07-30 MED ORDER — PROMETHAZINE HCL 25 MG PO TABS: 25.0000 mg | ORAL_TABLET | Freq: Four times a day (QID) | ORAL | 1 refills | Status: DC | PRN

## 2018-07-30 MED ORDER — FOLIC ACID 1 MG PO TABS: 1.0000 mg | ORAL_TABLET | Freq: Every day | ORAL | 10 refills | Status: DC

## 2018-07-30 NOTE — Progress Notes (Signed)
  Virtual Visit via Telephone Note  I connected with [name] on 07/30/18 at  8:45 AM EDT by telephone and verified that I am speaking with the correct person using two identifiers.  Location: Patient: Latoya Stark Provider: [facility] [place]    I discussed the limitations, risks, security and privacy concerns of performing an evaluation and management service by telephone and the availability of in person appointments. I also discussed with the patient that there may be a patient responsible charge related to this service. The patient expressed understanding and agreed to proceed.   History of Present Illness: PRENATAL INTAKE SUMMARY  [name] presents today New OB Nurse Interview.  OB History    Gravida  4   Para      Term      Preterm      AB  3   Living        SAB  3   TAB      Ectopic      Multiple      Live Births             I have reviewed the patient's medical, obstetrical, social, and family histories, medications, and available lab results.  SUBJECTIVE She is concerned about seizures during pregnancy since she can not take Dilantin. Last seizure episode last week. She has hx of multiple SAB's and voices concerns about losing this baby. She voices frustrations about her job not accommodating her needs.    Observations/Objective: Initial nurse interview for history (New OB)  EDD: 03/07/19 GA: [redacted]w[redacted]d G4P0030 FHT: non face to face interview  GENERAL APPEARANCE: alert, well appearing, orient to place and time, non face to face interview   Assessment and Plan: [facility]renatal care- [facility]  Discuss seizure management during visit today Prenatal labs/physical to be completed at provider visit Advised to download [email or web address]  BP cuff explained, ordered, and faxed   Follow Up Instructions:   I discussed the assessment and treatment plan with the patient. The patient was provided an opportunity to ask questions and all  were answered. The patient agreed with the plan and demonstrated an understanding of the instructions.   The patient was advised to call back or seek an in-person evaluation if the symptoms worsen or if the condition fails to improve as anticipated.  I provided 30 minutes of non-face-to-face time during this encounter.   Latoya Stark, CMA

## 2018-07-30 NOTE — Progress Notes (Signed)
Subjective:    Latoya Stark is a G4P0030 [redacted]w[redacted]d being seen today for her first obstetrical visit.  Her obstetrical history is significant for history of seizure disorder previously on dilantin and  obesity. Patient does intend to breast feed. Pregnancy history fully reviewed.  Patient reports nausea.  Vitals:   07/30/18 1105  BP: 117/78  Pulse: (!) 105  Weight: (!) 305 lb (138.3 kg)    HISTORY: OB History  Gravida Para Term Preterm AB Living  4       3    SAB TAB Ectopic Multiple Live Births  3            # Outcome Date GA Lbr Len/2nd Weight Sex Delivery Anes PTL Lv  4 Current           3 SAB 10/10/14          2 SAB           1 SAB              Birth Comments: System Generated. Please review and update pregnancy details.   Past Medical History:  Diagnosis Date  . ADHD (attention deficit hyperactivity disorder)   . Anemia   . Anxiety   . Asthma   . Depression   . Headache   . Obesity   . Seizures ([place])    "when I get too hot"  . Trichomonas infection   . UTI (lower urinary tract infection)    Past Surgical History:  Procedure Laterality Date  . ADENOIDECTOMY    . DILATION AND EVACUATION N/A 08/09/2013   Procedure: DILATATION AND EVACUATION;  Surgeon: Frederico Hamman, MD;  Location: [facility];  Service: Gynecology;  Laterality: N/A;  . ORTHOPEDIC SURGERY Left    left hand  . TONSILLECTOMY     Family History  Problem Relation Age of Onset  . Seizures Mother   . Stroke Mother   . Hypertension Father   . Diabetes Father   . Diabetes Maternal Grandmother   . Colon cancer Maternal Grandmother   . Hypertension Maternal Grandmother   . Clotting disorder Maternal Grandmother   . Breast cancer Maternal Grandmother   . Stroke Maternal Grandmother   . Diabetes Paternal Grandmother      Exam    Uterus:   8-week size  Pelvic Exam:    Perineum: Normal Perineum   Vulva: normal   Vagina:  normal mucosa, normal discharge   pH:    Cervix: nulliparous appearance    Adnexa: not evaluated   Bony Pelvis: gynecoid  System: Breast:  normal appearance, no masses or tenderness   Skin: normal coloration and turgor, no rashes    Neurologic: oriented, no focal deficits   Extremities: normal strength, tone, and muscle mass   HEENT extra ocular movement intact   Mouth/Teeth mucous membranes moist, pharynx normal without lesions and dental hygiene good   Neck supple and no masses   Cardiovascular: regular rate and rhythm   Respiratory:  appears well, vitals normal, no respiratory distress, acyanotic, normal RR, chest clear, no wheezing, crepitations, rhonchi, normal symmetric air entry   Abdomen: soft, non-tender; bowel sounds normal; no masses,  no organomegaly   Urinary:       Assessment:    Pregnancy: G4P0030 Patient Active Problem List   Diagnosis Date Noted  . Supervision of high risk pregnancy, antepartum 07/30/2018  . Maternal obesity affecting pregnancy, antepartum 07/30/2018  . History of seizure 07/30/2018  . Constipation 02/24/2014  .  CN (constipation) 12/23/2013  . Gonorrhea in female 10/05/2013  . BV (bacterial vaginosis) 09/22/2013  . Candidiasis of vulva and vagina 09/22/2013        Plan:     Initial labs drawn. Prenatal vitamins. Problem list reviewed and updated. Genetic Screening discussed : panorama to be ordered next visit with [facility] Patient with last seizure on 7/16. Referral to neuro made Patient to start ASA at 12 weeks.  Ultrasound discussed; fetal survey: ordered.  Follow up in 4 weeks. 50% of 30 min visit spent on counseling and coordination of care.     Waino Mounsey 07/30/2018

## 2018-07-30 NOTE — Addendum Note (Signed)
Addended by: Mora Bellman on: 07/30/2018 11:54 AM   Modules accepted: Orders

## 2018-07-30 NOTE — Patient Instructions (Signed)
First Trimester of Pregnancy The first trimester of pregnancy is from week 1 until the end of week 13 (months 1 through 3). A week after a sperm fertilizes an egg, the egg will implant on the wall of the uterus. This embryo will begin to develop into a baby. Genes from you and your partner will form the baby. The female genes will determine whether the baby will be a boy or a girl. At 6-8 weeks, the eyes and face will be formed, and the heartbeat can be seen on ultrasound. At the end of 12 weeks, all the baby's organs will be formed. Now that you are pregnant, you will want to do everything you can to have a healthy baby. Two of the most important things are to get good prenatal care and to follow your health care provider's instructions. Prenatal care is all the medical care you receive before the baby's birth. This care will help prevent, find, and treat any problems during the pregnancy and childbirth. Body changes during your first trimester Your body goes through many changes during pregnancy. The changes vary from woman to woman.  You may gain or lose a couple of pounds at first.  You may feel sick to your stomach (nauseous) and you may throw up (vomit). If the vomiting is uncontrollable, call your health care provider.  You may tire easily.  You may develop headaches that can be relieved by medicines. All medicines should be approved by your health care provider.  You may urinate more often. Painful urination may mean you have a bladder infection.  You may develop heartburn as a result of your pregnancy.  You may develop constipation because certain hormones are causing the muscles that push stool through your intestines to slow down.  You may develop hemorrhoids or swollen veins (varicose veins).  Your breasts may begin to grow larger and become tender. Your nipples may stick out more, and the tissue that surrounds them (areola) may become darker.  Your gums may bleed and may be  sensitive to brushing and flossing.  Dark spots or blotches (chloasma, mask of pregnancy) may develop on your face. This will likely fade after the baby is born.  Your menstrual periods will stop.  You may have a loss of appetite.  You may develop cravings for certain kinds of food.  You may have changes in your emotions from day to day, such as being excited to be pregnant or being concerned that something may go wrong with the pregnancy and baby.  You may have more vivid and strange dreams.  You may have changes in your hair. These can include thickening of your hair, rapid growth, and changes in texture. Some women also have hair loss during or after pregnancy, or hair that feels dry or thin. Your hair will most likely return to normal after your baby is born. What to expect at prenatal visits During a routine prenatal visit:  You will be weighed to make sure you and the baby are growing normally.  Your blood pressure will be taken.  Your abdomen will be measured to track your baby's growth.  The fetal heartbeat will be listened to between weeks 10 and 14 of your pregnancy.  Test results from any previous visits will be discussed. Your health care provider may ask you:  How you are feeling.  If you are feeling the baby move.  If you have had any abnormal symptoms, such as leaking fluid, bleeding, severe headaches, or  abdominal cramping.  If you are using any tobacco products, including cigarettes, chewing tobacco, and electronic cigarettes.  If you have any questions. Other tests that may be performed during your first trimester include:  Blood tests to find your blood type and to check for the presence of any previous infections. The tests will also be used to check for low iron levels (anemia) and protein on red blood cells (Rh antibodies). Depending on your risk factors, or if you previously had diabetes during pregnancy, you may have tests to check for high blood sugar  that affects pregnant women (gestational diabetes).  Urine tests to check for infections, diabetes, or protein in the urine.  An ultrasound to confirm the proper growth and development of the baby.  Fetal screens for spinal cord problems (spina bifida) and Down syndrome.  HIV (human immunodeficiency virus) testing. Routine prenatal testing includes screening for HIV, unless you choose not to have this test.  You may need other tests to make sure you and the baby are doing well. Follow these instructions at home: Medicines  Follow your health care provider's instructions regarding medicine use. Specific medicines may be either safe or unsafe to take during pregnancy.  Take a prenatal vitamin that contains at least 600 micrograms (mcg) of folic acid.  If you develop constipation, try taking a stool softener if your health care provider approves. Eating and drinking   Eat a balanced diet that includes fresh fruits and vegetables, whole grains, good sources of protein such as meat, eggs, or tofu, and low-fat dairy. Your health care provider will help you determine the amount of weight gain that is right for you.  Avoid raw meat and uncooked cheese. These carry germs that can cause birth defects in the baby.  Eating four or five small meals rather than three large meals a day may help relieve nausea and vomiting. If you start to feel nauseous, eating a few soda crackers can be helpful. Drinking liquids between meals, instead of during meals, also seems to help ease nausea and vomiting.  Limit foods that are high in fat and processed sugars, such as fried and sweet foods.  To prevent constipation: ? Eat foods that are high in fiber, such as fresh fruits and vegetables, whole grains, and beans. ? Drink enough fluid to keep your urine clear or pale yellow. Activity  Exercise only as directed by your health care provider. Most women can continue their usual exercise routine during  pregnancy. Try to exercise for 30 minutes at least 5 days a week. Exercising will help you: ? Control your weight. ? Stay in shape. ? Be prepared for labor and delivery.  Experiencing pain or cramping in the lower abdomen or lower back is a good sign that you should stop exercising. Check with your health care provider before continuing with normal exercises.  Try to avoid standing for long periods of time. Move your legs often if you must stand in one place for a long time.  Avoid heavy lifting.  Wear low-heeled shoes and practice good posture.  You may continue to have sex unless your health care provider tells you not to. Relieving pain and discomfort  Wear a good support bra to relieve breast tenderness.  Take warm sitz baths to soothe any pain or discomfort caused by hemorrhoids. Use hemorrhoid cream if your health care provider approves.  Rest with your legs elevated if you have leg cramps or low back pain.  If you develop varicose veins  in your legs, wear support hose. Elevate your feet for 15 minutes, 3-4 times a day. Limit salt in your diet. Prenatal care  Schedule your prenatal visits by the twelfth week of pregnancy. They are usually scheduled monthly at first, then more often in the last 2 months before delivery.  Write down your questions. Take them to your prenatal visits.  Keep all your prenatal visits as told by your health care provider. This is important. Safety  Wear your seat belt at all times when driving.  Make a list of emergency phone numbers, including numbers for family, friends, the hospital, and police and fire departments. General instructions  Ask your health care provider for a referral to a local prenatal education class. Begin classes no later than the beginning of month 6 of your pregnancy.  Ask for help if you have counseling or nutritional needs during pregnancy. Your health care provider can offer advice or refer you to specialists for help  with various needs.  Do not use hot tubs, steam rooms, or saunas.  Do not douche or use tampons or scented sanitary pads.  Do not cross your legs for long periods of time.  Avoid cat litter boxes and soil used by cats. These carry germs that can cause birth defects in the baby and possibly loss of the fetus by miscarriage or stillbirth.  Avoid all smoking, herbs, alcohol, and medicines not prescribed by your health care provider. Chemicals in these products affect the formation and growth of the baby.  Do not use any products that contain nicotine or tobacco, such as cigarettes and e-cigarettes. If you need help quitting, ask your health care provider. You may receive counseling support and other resources to help you quit.  Schedule a dentist appointment. At home, brush your teeth with a soft toothbrush and be gentle when you floss. Contact a health care provider if:  You have dizziness.  You have mild pelvic cramps, pelvic pressure, or nagging pain in the abdominal area.  You have persistent nausea, vomiting, or diarrhea.  You have a bad smelling vaginal discharge.  You have pain when you urinate.  You notice increased swelling in your face, hands, legs, or ankles.  You are exposed to fifth disease or chickenpox.  You are exposed to [place] measles (rubella) and have never had it. Get help right away if:  You have a fever.  You are leaking fluid from your vagina.  You have spotting or bleeding from your vagina.  You have severe abdominal cramping or pain.  You have rapid weight gain or loss.  You vomit blood or material that looks like coffee grounds.  You develop a severe headache.  You have shortness of breath.  You have any kind of trauma, such as from a fall or a car accident. Summary  The first trimester of pregnancy is from week 1 until the end of week 13 (months 1 through 3).  Your body goes through many changes during pregnancy. The changes vary from  woman to woman.  You will have routine prenatal visits. During those visits, your health care provider will examine you, discuss any test results you may have, and talk with you about how you are feeling. This information is not intended to replace advice given to you by your health care provider. Make sure you discuss any questions you have with your health care provider. Document Released: 12/20/2000 Document Revised: 12/08/2016 Document Reviewed: 12/08/2015 Elsevier Patient Education  2020 [employer].  Second Trimester of Pregnancy The second trimester is from week 14 through week 27 (months 4 through 6). The second trimester is often a time when you feel your best. Your body has adjusted to being pregnant, and you begin to feel better physically. Usually, morning sickness has lessened or quit completely, you may have more energy, and you may have an increase in appetite. The second trimester is also a time when the fetus is growing rapidly. At the end of the sixth month, the fetus is about 9 inches long and weighs about 1 pounds. You will likely begin to feel the baby move (quickening) between 16 and 20 weeks of pregnancy. Body changes during your second trimester Your body continues to go through many changes during your second trimester. The changes vary from woman to woman.  Your weight will continue to increase. You will notice your lower abdomen bulging out.  You may begin to get stretch marks on your hips, abdomen, and breasts.  You may develop headaches that can be relieved by medicines. The medicines should be approved by your health care provider.  You may urinate more often because the fetus is pressing on your bladder.  You may develop or continue to have heartburn as a result of your pregnancy.  You may develop constipation because certain hormones are causing the muscles that push waste through your intestines to slow down.  You may develop hemorrhoids or swollen,  bulging veins (varicose veins).  You may have back pain. This is caused by: ? Weight gain. ? Pregnancy hormones that are relaxing the joints in your pelvis. ? A shift in weight and the muscles that support your balance.  Your breasts will continue to grow and they will continue to become tender.  Your gums may bleed and may be sensitive to brushing and flossing.  Dark spots or blotches (chloasma, mask of pregnancy) may develop on your face. This will likely fade after the baby is born.  A dark line from your belly button to the pubic area (linea nigra) may appear. This will likely fade after the baby is born.  You may have changes in your hair. These can include thickening of your hair, rapid growth, and changes in texture. Some women also have hair loss during or after pregnancy, or hair that feels dry or thin. Your hair will most likely return to normal after your baby is born. What to expect at prenatal visits During a routine prenatal visit:  You will be weighed to make sure you and the fetus are growing normally.  Your blood pressure will be taken.  Your abdomen will be measured to track your baby's growth.  The fetal heartbeat will be listened to.  Any test results from the previous visit will be discussed. Your health care provider may ask you:  How you are feeling.  If you are feeling the baby move.  If you have had any abnormal symptoms, such as leaking fluid, bleeding, severe headaches, or abdominal cramping.  If you are using any tobacco products, including cigarettes, chewing tobacco, and electronic cigarettes.  If you have any questions. Other tests that may be performed during your second trimester include:  Blood tests that check for: ? Low iron levels (anemia). ? High blood sugar that affects pregnant women (gestational diabetes) between 59 and 28 weeks. ? Rh antibodies. This is to check for a protein on red blood cells (Rh factor).  Urine tests to check  for infections, diabetes, or protein in  the urine.  An ultrasound to confirm the proper growth and development of the baby.  An amniocentesis to check for possible genetic problems.  Fetal screens for spina bifida and Down syndrome.  HIV (human immunodeficiency virus) testing. Routine prenatal testing includes screening for HIV, unless you choose not to have this test. Follow these instructions at home: Medicines  Follow your health care provider's instructions regarding medicine use. Specific medicines may be either safe or unsafe to take during pregnancy.  Take a prenatal vitamin that contains at least 600 micrograms (mcg) of folic acid.  If you develop constipation, try taking a stool softener if your health care provider approves. Eating and drinking   Eat a balanced diet that includes fresh fruits and vegetables, whole grains, good sources of protein such as meat, eggs, or tofu, and low-fat dairy. Your health care provider will help you determine the amount of weight gain that is right for you.  Avoid raw meat and uncooked cheese. These carry germs that can cause birth defects in the baby.  If you have low calcium intake from food, talk to your health care provider about whether you should take a daily calcium supplement.  Limit foods that are high in fat and processed sugars, such as fried and sweet foods.  To prevent constipation: ? Drink enough fluid to keep your urine clear or pale yellow. ? Eat foods that are high in fiber, such as fresh fruits and vegetables, whole grains, and beans. Activity  Exercise only as directed by your health care provider. Most women can continue their usual exercise routine during pregnancy. Try to exercise for 30 minutes at least 5 days a week. Stop exercising if you experience uterine contractions.  Avoid heavy lifting, wear low heel shoes, and practice good posture.  A sexual relationship may be continued unless your health care provider  directs you otherwise. Relieving pain and discomfort  Wear a good support bra to prevent discomfort from breast tenderness.  Take warm sitz baths to soothe any pain or discomfort caused by hemorrhoids. Use hemorrhoid cream if your health care provider approves.  Rest with your legs elevated if you have leg cramps or low back pain.  If you develop varicose veins, wear support hose. Elevate your feet for 15 minutes, 3-4 times a day. Limit salt in your diet. Prenatal Care  Write down your questions. Take them to your prenatal visits.  Keep all your prenatal visits as told by your health care provider. This is important. Safety  Wear your seat belt at all times when driving.  Make a list of emergency phone numbers, including numbers for family, friends, the hospital, and police and fire departments. General instructions  Ask your health care provider for a referral to a local prenatal education class. Begin classes no later than the beginning of month 6 of your pregnancy.  Ask for help if you have counseling or nutritional needs during pregnancy. Your health care provider can offer advice or refer you to specialists for help with various needs.  Do not use hot tubs, steam rooms, or saunas.  Do not douche or use tampons or scented sanitary pads.  Do not cross your legs for long periods of time.  Avoid cat litter boxes and soil used by cats. These carry germs that can cause birth defects in the baby and possibly loss of the fetus by miscarriage or stillbirth.  Avoid all smoking, herbs, alcohol, and unprescribed drugs. Chemicals in these products can affect the formation  and growth of the baby.  Do not use any products that contain nicotine or tobacco, such as cigarettes and e-cigarettes. If you need help quitting, ask your health care provider.  Visit your dentist if you have not gone yet during your pregnancy. Use a soft toothbrush to brush your teeth and be gentle when you  floss. Contact a health care provider if:  You have dizziness.  You have mild pelvic cramps, pelvic pressure, or nagging pain in the abdominal area.  You have persistent nausea, vomiting, or diarrhea.  You have a bad smelling vaginal discharge.  You have pain when you urinate. Get help right away if:  You have a fever.  You are leaking fluid from your vagina.  You have spotting or bleeding from your vagina.  You have severe abdominal cramping or pain.  You have rapid weight gain or weight loss.  You have shortness of breath with chest pain.  You notice sudden or extreme swelling of your face, hands, ankles, feet, or legs.  You have not felt your baby move in over an hour.  You have severe headaches that do not go away when you take medicine.  You have vision changes. Summary  The second trimester is from week 14 through week 27 (months 4 through 6). It is also a time when the fetus is growing rapidly.  Your body goes through many changes during pregnancy. The changes vary from woman to woman.  Avoid all smoking, herbs, alcohol, and unprescribed drugs. These chemicals affect the formation and growth your baby.  Do not use any tobacco products, such as cigarettes, chewing tobacco, and e-cigarettes. If you need help quitting, ask your health care provider.  Contact your health care provider if you have any questions. Keep all prenatal visits as told by your health care provider. This is important. This information is not intended to replace advice given to you by your health care provider. Make sure you discuss any questions you have with your health care provider. Document Released: 12/20/2000 Document Revised: 04/19/2018 Document Reviewed: 02/01/2016 Elsevier Patient Education  2020 [employer].   Contraception Choices Contraception, also called birth control, refers to methods or devices that prevent pregnancy. Hormonal methods Contraceptive implant  A  contraceptive implant is a thin, plastic tube that contains a hormone. It is inserted into the upper part of the arm. It can remain in place for up to 3 years. Progestin-only injections Progestin-only injections are injections of progestin, a synthetic form of the hormone progesterone. They are given every 3 months by a health care provider. Birth control pills  Birth control pills are pills that contain hormones that prevent pregnancy. They must be taken once a day, preferably at the same time each day. Birth control patch  The birth control patch contains hormones that prevent pregnancy. It is placed on the skin and must be changed once a week for three weeks and removed on the fourth week. A prescription is needed to use this method of contraception. Vaginal ring  A vaginal ring contains hormones that prevent pregnancy. It is placed in the vagina for three weeks and removed on the fourth week. After that, the process is repeated with a new ring. A prescription is needed to use this method of contraception. Emergency contraceptive Emergency contraceptives prevent pregnancy after unprotected sex. They come in pill form and can be taken up to 5 days after sex. They work best the sooner they are taken after having sex. Most emergency contraceptives  are available without a prescription. This method should not be used as your only form of birth control. Barrier methods Female condom  A female condom is a thin sheath that is worn over the penis during sex. Condoms keep sperm from going inside a woman's body. They can be used with a spermicide to increase their effectiveness. They should be disposed after a single use. Female condom  A female condom is a soft, loose-fitting sheath that is put into the vagina before sex. The condom keeps sperm from going inside a woman's body. They should be disposed after a single use. Diaphragm  A diaphragm is a soft, dome-shaped barrier. It is inserted into the  vagina before sex, along with a spermicide. The diaphragm blocks sperm from entering the uterus, and the spermicide kills sperm. A diaphragm should be left in the vagina for 6-8 hours after sex and removed within 24 hours. A diaphragm is prescribed and fitted by a health care provider. A diaphragm should be replaced every 1-2 years, after giving birth, after gaining more than 15 lb (6.8 kg), and after pelvic surgery. Cervical cap  A cervical cap is a round, soft latex or plastic cup that fits over the cervix. It is inserted into the vagina before sex, along with spermicide. It blocks sperm from entering the uterus. The cap should be left in place for 6-8 hours after sex and removed within 48 hours. A cervical cap must be prescribed and fitted by a health care provider. It should be replaced every 2 years. Sponge  A sponge is a soft, circular piece of polyurethane foam with spermicide on it. The sponge helps block sperm from entering the uterus, and the spermicide kills sperm. To use it, you make it wet and then insert it into the vagina. It should be inserted before sex, left in for at least 6 hours after sex, and removed and thrown away within 30 hours. Spermicides Spermicides are chemicals that kill or block sperm from entering the cervix and uterus. They can come as a cream, jelly, suppository, foam, or tablet. A spermicide should be inserted into the vagina with an applicator at least 44-31 minutes before sex to allow time for it to work. The process must be repeated every time you have sex. Spermicides do not require a prescription. Intrauterine contraception Intrauterine device (IUD) An IUD is a T-shaped device that is put in a woman's uterus. There are two types:  Hormone IUD.This type contains progestin, a synthetic form of the hormone progesterone. This type can stay in place for 3-5 years.  Copper IUD.This type is wrapped in copper wire. It can stay in place for 10 years.  Permanent  methods of contraception Female tubal ligation In this method, a woman's fallopian tubes are sealed, tied, or blocked during surgery to prevent eggs from traveling to the uterus. Hysteroscopic sterilization In this method, a small, flexible insert is placed into each fallopian tube. The inserts cause scar tissue to form in the fallopian tubes and block them, so sperm cannot reach an egg. The procedure takes about 3 months to be effective. Another form of birth control must be used during those 3 months. Female sterilization This is a procedure to tie off the tubes that carry sperm (vasectomy). After the procedure, the man can still ejaculate fluid (semen). Natural planning methods Natural family planning In this method, a couple does not have sex on days when the woman could become pregnant. Calendar method This means keeping track of  the length of each menstrual cycle, identifying the days when pregnancy can happen, and not having sex on those days. Ovulation method In this method, a couple avoids sex during ovulation. Symptothermal method This method involves not having sex during ovulation. The woman typically checks for ovulation by watching changes in her temperature and in the consistency of cervical mucus. Post-ovulation method In this method, a couple waits to have sex until after ovulation. Summary  Contraception, also called birth control, means methods or devices that prevent pregnancy.  Hormonal methods of contraception include implants, injections, pills, patches, vaginal rings, and emergency contraceptives.  Barrier methods of contraception can include female condoms, female condoms, diaphragms, cervical caps, sponges, and spermicides.  There are two types of IUDs (intrauterine devices). An IUD can be put in a woman's uterus to prevent pregnancy for 3-5 years.  Permanent sterilization can be done through a procedure for males, females, or both.  Natural family planning methods  involve not having sex on days when the woman could become pregnant. This information is not intended to replace advice given to you by your health care provider. Make sure you discuss any questions you have with your health care provider. Document Released: 12/26/2004 Document Revised: 12/28/2016 Document Reviewed: 01/29/2016 Elsevier Patient Education  2020 [employer].   Breastfeeding  Choosing to breastfeed is one of the best decisions you can make for yourself and your baby. A change in hormones during pregnancy causes your breasts to make breast milk in your milk-producing glands. Hormones prevent breast milk from being released before your baby is born. They also prompt milk flow after birth. Once breastfeeding has begun, thoughts of your baby, as well as his or her sucking or crying, can stimulate the release of milk from your milk-producing glands. Benefits of breastfeeding Research shows that breastfeeding offers many health benefits for infants and mothers. It also offers a cost-free and convenient way to feed your baby. For your baby  Your first milk (colostrum) helps your baby's digestive system to function better.  Special cells in your milk (antibodies) help your baby to fight off infections.  Breastfed babies are less likely to develop asthma, allergies, obesity, or type 2 diabetes. They are also at lower risk for sudden infant death syndrome (SIDS).  Nutrients in breast milk are better able to meet your babys needs compared to infant formula.  Breast milk improves your baby's brain development. For you  Breastfeeding helps to create a very special bond between you and your baby.  Breastfeeding is convenient. Breast milk costs nothing and is always available at the correct temperature.  Breastfeeding helps to burn calories. It helps you to lose the weight that you gained during pregnancy.  Breastfeeding makes your uterus return faster to its size before pregnancy. It  also slows bleeding (lochia) after you give birth.  Breastfeeding helps to lower your risk of developing type 2 diabetes, osteoporosis, rheumatoid arthritis, cardiovascular disease, and breast, ovarian, uterine, and endometrial cancer later in life. Breastfeeding basics Starting breastfeeding  Find a comfortable place to sit or lie down, with your neck and back well-supported.  Place a pillow or a rolled-up blanket under your baby to bring him or her to the level of your breast (if you are seated). Nursing pillows are specially designed to help support your arms and your baby while you breastfeed.  Make sure that your baby's tummy (abdomen) is facing your abdomen.  Gently massage your breast. With your fingertips, massage from the outer edges  of your breast inward toward the nipple. This encourages milk flow. If your milk flows slowly, you may need to continue this action during the feeding.  Support your breast with 4 fingers underneath and your thumb above your nipple (make the letter "C" with your hand). Make sure your fingers are well away from your nipple and your babys mouth.  Stroke your baby's lips gently with your finger or nipple.  When your baby's mouth is open wide enough, quickly bring your baby to your breast, placing your entire nipple and as much of the areola as possible into your baby's mouth. The areola is the colored area around your nipple. ? More areola should be visible above your baby's upper lip than below the lower lip. ? Your baby's lips should be opened and extended outward (flanged) to ensure an adequate, comfortable latch. ? Your baby's tongue should be between his or her lower gum and your breast.  Make sure that your baby's mouth is correctly positioned around your nipple (latched). Your baby's lips should create a seal on your breast and be turned out (everted).  It is common for your baby to suck about 2-3 minutes in order to start the flow of breast  milk. Latching Teaching your baby how to latch onto your breast properly is very important. An improper latch can cause nipple pain, decreased milk supply, and poor weight gain in your baby. Also, if your baby is not latched onto your nipple properly, he or she may swallow some air during feeding. This can make your baby fussy. Burping your baby when you switch breasts during the feeding can help to get rid of the air. However, teaching your baby to latch on properly is still the best way to prevent fussiness from swallowing air while breastfeeding. Signs that your baby has successfully latched onto your nipple  Silent tugging or silent sucking, without causing you pain. Infant's lips should be extended outward (flanged).  Swallowing heard between every 3-4 sucks once your milk has started to flow (after your let-down milk reflex occurs).  Muscle movement above and in front of his or her ears while sucking. Signs that your baby has not successfully latched onto your nipple  Sucking sounds or smacking sounds from your baby while breastfeeding.  Nipple pain. If you think your baby has not latched on correctly, slip your finger into the corner of your babys mouth to break the suction and place it between your baby's gums. Attempt to start breastfeeding again. Signs of successful breastfeeding Signs from your baby  Your baby will gradually decrease the number of sucks or will completely stop sucking.  Your baby will fall asleep.  Your baby's body will relax.  Your baby will retain a small amount of milk in his or her mouth.  Your baby will let go of your breast by himself or herself. Signs from you  Breasts that have increased in firmness, weight, and size 1-3 hours after feeding.  Breasts that are softer immediately after breastfeeding.  Increased milk volume, as well as a change in milk consistency and color by the fifth day of breastfeeding.  Nipples that are not sore, cracked, or  bleeding. Signs that your baby is getting enough milk  Wetting at least 1-2 diapers during the first 24 hours after birth.  Wetting at least 5-6 diapers every 24 hours for the first week after birth. The urine should be clear or pale yellow by the age of 5 days.  Wetting  6-8 diapers every 24 hours as your baby continues to grow and develop.  At least 3 stools in a 24-hour period by the age of 5 days. The stool should be soft and yellow.  At least 3 stools in a 24-hour period by the age of 7 days. The stool should be seedy and yellow.  No loss of weight greater than 10% of birth weight during the first 3 days of life.  Average weight gain of 4-7 oz (113-198 g) per week after the age of 4 days.  Consistent daily weight gain by the age of 5 days, without weight loss after the age of 2 weeks. After a feeding, your baby may spit up a small amount of milk. This is normal. Breastfeeding frequency and duration Frequent feeding will help you make more milk and can prevent sore nipples and extremely full breasts (breast engorgement). Breastfeed when you feel the need to reduce the fullness of your breasts or when your baby shows signs of hunger. This is called "breastfeeding on demand." Signs that your baby is hungry include:  Increased alertness, activity, or restlessness.  Movement of the head from side to side.  Opening of the mouth when the corner of the mouth or cheek is stroked (rooting).  Increased sucking sounds, smacking lips, cooing, sighing, or squeaking.  Hand-to-mouth movements and sucking on fingers or hands.  Fussing or crying. Avoid introducing a pacifier to your baby in the first 4-6 weeks after your baby is born. After this time, you may choose to use a pacifier. Research has shown that pacifier use during the first year of a baby's life decreases the risk of sudden infant death syndrome (SIDS). Allow your baby to feed on each breast as long as he or she wants. When your  baby unlatches or falls asleep while feeding from the first breast, offer the second breast. Because newborns are often sleepy in the first few weeks of life, you may need to awaken your baby to get him or her to feed. Breastfeeding times will vary from baby to baby. However, the following rules can serve as a guide to help you make sure that your baby is properly fed:  Newborns (babies 34 weeks of age or younger) may breastfeed every 1-3 hours.  Newborns should not go without breastfeeding for longer than 3 hours during the day or 5 hours during the night.  You should breastfeed your baby a minimum of 8 times in a 24-hour period. Breast milk pumping     Pumping and storing breast milk allows you to make sure that your baby is exclusively fed your breast milk, even at times when you are unable to breastfeed. This is especially important if you go back to work while you are still breastfeeding, or if you are not able to be present during feedings. Your lactation consultant can help you find a method of pumping that works best for you and give you guidelines about how long it is safe to store breast milk. Caring for your breasts while you breastfeed Nipples can become dry, cracked, and sore while breastfeeding. The following recommendations can help keep your breasts moisturized and healthy:  Avoid using soap on your nipples.  Wear a supportive bra designed especially for nursing. Avoid wearing underwire-style bras or extremely tight bras (sports bras).  Air-dry your nipples for 3-4 minutes after each feeding.  Use only cotton bra pads to absorb leaked breast milk. Leaking of breast milk between feedings is normal.  Use lanolin on your nipples after breastfeeding. Lanolin helps to maintain your skin's normal moisture barrier. Pure lanolin is not harmful (not toxic) to your baby. You may also hand express a few drops of breast milk and gently massage that milk into your nipples and allow the milk  to air-dry. In the first few weeks after giving birth, some women experience breast engorgement. Engorgement can make your breasts feel heavy, warm, and tender to the touch. Engorgement peaks within 3-5 days after you give birth. The following recommendations can help to ease engorgement:  Completely empty your breasts while breastfeeding or pumping. You may want to start by applying warm, moist heat (in the shower or with warm, water-soaked hand towels) just before feeding or pumping. This increases circulation and helps the milk flow. If your baby does not completely empty your breasts while breastfeeding, pump any extra milk after he or she is finished.  Apply ice packs to your breasts immediately after breastfeeding or pumping, unless this is too uncomfortable for you. To do this: ? Put ice in a plastic bag. ? Place a towel between your skin and the bag. ? Leave the ice on for 20 minutes, 2-3 times a day.  Make sure that your baby is latched on and positioned properly while breastfeeding. If engorgement persists after 48 hours of following these recommendations, contact your health care provider or a Science writer. Overall health care recommendations while breastfeeding  Eat 3 healthy meals and 3 snacks every day. Well-nourished mothers who are breastfeeding need an additional 450-500 calories a day. You can meet this requirement by increasing the amount of a balanced diet that you eat.  Drink enough water to keep your urine pale yellow or clear.  Rest often, relax, and continue to take your prenatal vitamins to prevent fatigue, stress, and low vitamin and mineral levels in your body (nutrient deficiencies).  Do not use any products that contain nicotine or tobacco, such as cigarettes and e-cigarettes. Your baby may be harmed by chemicals from cigarettes that pass into breast milk and exposure to secondhand smoke. If you need help quitting, ask your health care provider.  Avoid  alcohol.  Do not use illegal drugs or marijuana.  Talk with your health care provider before taking any medicines. These include over-the-counter and prescription medicines as well as vitamins and herbal supplements. Some medicines that may be harmful to your baby can pass through breast milk.  It is possible to become pregnant while breastfeeding. If birth control is desired, ask your health care provider about options that will be safe while breastfeeding your baby. Where to find more information: [employer]: www.llli.org Contact a health care provider if:  You feel like you want to stop breastfeeding or have become frustrated with breastfeeding.  Your nipples are cracked or bleeding.  Your breasts are red, tender, or warm.  You have: ? Painful breasts or nipples. ? A swollen area on either breast. ? A fever or chills. ? Nausea or vomiting. ? Drainage other than breast milk from your nipples.  Your breasts do not become full before feedings by the fifth day after you give birth.  You feel sad and depressed.  Your baby is: ? Too sleepy to eat well. ? Having trouble sleeping. ? More than 30 week old and wetting fewer than 6 diapers in a 24-hour period. ? Not gaining weight by 71 days of age.  Your baby has fewer than 3 stools in a 24-hour period.  Your baby's skin or the white parts of his or her eyes become yellow. Get help right away if:  Your baby is overly tired (lethargic) and does not want to wake up and feed.  Your baby develops an unexplained fever. Summary  Breastfeeding offers many health benefits for infant and mothers.  Try to breastfeed your infant when he or she shows early signs of hunger.  Gently tickle or stroke your baby's lips with your finger or nipple to allow the baby to open his or her mouth. Bring the baby to your breast. Make sure that much of the areola is in your baby's mouth. Offer one side and burp the baby before you offer  the other side.  Talk with your health care provider or lactation consultant if you have questions or you face problems as you breastfeed. This information is not intended to replace advice given to you by your health care provider. Make sure you discuss any questions you have with your health care provider. Document Released: 12/26/2004 Document Revised: 03/22/2017 Document Reviewed: 01/28/2016 Elsevier Patient Education  2020 [employer].

## 2018-07-31 LAB — OBSTETRIC PANEL, INCLUDING HIV
Antibody Screen: NEGATIVE
Basophils Absolute: 0 10*3/uL (ref 0.0–0.2)
Basos: 0 %
EOS (ABSOLUTE): 0.2 10*3/uL (ref 0.0–0.4)
Eos: 2 %
HIV Screen 4th Generation wRfx: NONREACTIVE
Hematocrit: 34.8 % (ref 34.0–46.6)
Hemoglobin: 12.1 g/dL (ref 11.1–15.9)
Hepatitis B Surface Ag: NEGATIVE
Immature Grans (Abs): 0 10*3/uL (ref 0.0–0.1)
Immature Granulocytes: 1 %
Lymphocytes Absolute: 1.5 10*3/uL (ref 0.7–3.1)
Lymphs: 22 %
MCH: 28.6 pg (ref 26.6–33.0)
MCHC: 34.8 g/dL (ref 31.5–35.7)
MCV: 82 fL (ref 79–97)
Monocytes Absolute: 0.5 10*3/uL (ref 0.1–0.9)
Monocytes: 7 %
Neutrophils Absolute: 4.7 10*3/uL (ref 1.4–7.0)
Neutrophils: 68 %
Platelets: 340 10*3/uL (ref 150–450)
RBC: 4.23 x10E6/uL (ref 3.77–5.28)
RDW: 13.2 % (ref 11.7–15.4)
RPR Ser Ql: NONREACTIVE
Rh Factor: POSITIVE
Rubella Antibodies, IGG: 1.49 index (ref >0.99)
WBC: 6.9 10*3/uL (ref 3.4–10.8)

## 2018-07-31 LAB — GC/CHLAMYDIA PROBE AMP (~~LOC~~) NOT AT ARMC
Chlamydia: NEGATIVE
Neisseria Gonorrhea: NEGATIVE

## 2018-07-31 LAB — HEMOGLOBIN A1C
Est. average glucose Bld gHb Est-mCnc: 114 mg/dL
Hgb A1c MFr Bld: 5.6 % (ref 4.8–5.6)

## 2018-08-01 ENCOUNTER — Ambulatory Visit: Payer: Medicaid Other | Admitting: Neurology

## 2018-08-01 ENCOUNTER — Encounter: Payer: Self-pay | Admitting: Neurology

## 2018-08-01 ENCOUNTER — Other Ambulatory Visit: Payer: Self-pay

## 2018-08-01 DIAGNOSIS — IMO0002 Reserved for concepts with insufficient information to code with codable children: Secondary | ICD-10-CM

## 2018-08-01 DIAGNOSIS — Z3490 Encounter for supervision of normal pregnancy, unspecified, unspecified trimester: Secondary | ICD-10-CM | POA: Insufficient documentation

## 2018-08-01 DIAGNOSIS — G40409 Other generalized epilepsy and epileptic syndromes, not intractable, without status epilepticus: Secondary | ICD-10-CM | POA: Diagnosis not present

## 2018-08-01 DIAGNOSIS — Z3A09 9 weeks gestation of pregnancy: Secondary | ICD-10-CM

## 2018-08-01 DIAGNOSIS — G43709 Chronic migraine without aura, not intractable, without status migrainosus: Secondary | ICD-10-CM

## 2018-08-01 HISTORY — DX: Reserved for concepts with insufficient information to code with codable children: IMO0002

## 2018-08-01 HISTORY — DX: Other generalized epilepsy and epileptic syndromes, not intractable, without status epilepticus: G40.409

## 2018-08-01 HISTORY — DX: Chronic migraine without aura, not intractable, without status migrainosus: G43.709

## 2018-08-01 MED ORDER — LEVETIRACETAM 750 MG PO TABS: ORAL_TABLET | ORAL | 11 refills | Status: DC

## 2018-08-01 NOTE — Progress Notes (Signed)
[facility]  PATIENT: Latoya Stark DOB: 18-Mar-1995  REFERRING DOCTOR OR PCP:  Harle Battiest is Latoya Stark SOURCE: patient, fiance, notes from ED, labs  _________________________________   HISTORICAL  CHIEF COMPLAINT:  Chief Complaint  Patient presents with  . New Patient (Initial Visit)    RM 13 with fiance. His temp: 97.8. Stopped taking Dilantin. Currently pregnant, 8weeks and 6 days along.    HISTORY OF PRESENT ILLNESS:  I had the pleasure of seeing your patient, Latoya Stark, at [facility] for neurologic consultation regarding her headaches and seizures.  She is a 23 year old woman who has a long history of headaches and seizures since age 43.  She is currently 8 to [redacted] weeks pregnant.  She has had migraine headaches for yearsbut the frequency has increased over the past few months.  Headaches are daily and located bilaterally, forehead > temporal region.    She gets nausea, severe photophobia and phonophobia.    Headaches usually improve if she sleeps them off.    Heat triggers the headache.  Tylenol sometimes stops the headaches.   Topamax had helped her headaches with benefit.      She had her first seizure at age 23.  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  Her last one was early July 2020.  Her previous one had been a month or two earlier (while on Dilantin).   She has been having one every 2-3 months, even while on treatment.  She stopped Dilantin 4 weeks ago (10 days or so before the last seizure) due to pregnancy.    She is currently 8-[redacted] weeks pregnant.       She has not tried other medications besides Dilantin.    She has seen Dr. Gaynell Face in the past.  REVIEW OF SYSTEMS: Constitutional: No fevers, chills, sweats, or change in appetite Eyes: No visual changes, double vision, eye pain Ear, nose and throat: No hearing loss, ear pain, nasal congestion, sore throat  Cardiovascular: No chest pain, palpitations Respiratory: No shortness of breath at rest or with exertion.   No wheezes GastrointestinaI: She has had episodes of abdominal pain in the past.  No nausea, vomiting, diarrhea, fecal incontinence Genitourinary: No dysuria, urinary retention or frequency.  No nocturia. Musculoskeletal: No neck pain, back pain Integumentary: No rash, pruritus, skin lesions Neurological: as above Psychiatric: No depression at this time.  No anxiety Endocrine: No palpitations, diaphoresis, change in appetite, change in weigh or increased thirst Hematologic/Lymphatic: No anemia, purpura, petechiae. Allergic/Immunologic: No itchy/runny eyes, nasal congestion, recent allergic reactions, rashes  ALLERGIES: Allergies  Allergen Reactions  . Milk-Related Compounds Anaphylaxis    Breaks face out *can have 2% milk*  . Mushroom Extract Complex Anaphylaxis  . Penicillins Anaphylaxis    Has patient had a PCN reaction causing immediate rash, facial/tongue/throat swelling, SOB or lightheadedness with hypotension: Yes Has patient had a PCN reaction causing severe rash involving mucus membranes or skin necrosis: No Has patient had a PCN reaction that required hospitalization No Has patient had a PCN reaction occurring within the last 10 years: Yes If all of the above answers are "NO", then may proceed with Cephalosporin use.   [name] Other (See Comments)    Breaks face out  . Latex Itching and Swelling  . Sulfa Antibiotics Swelling    HOME MEDICATIONS:  Current Outpatient Medications:  .  acetaminophen (TYLENOL) 500 MG tablet, Take 1,000 mg by  mouth every 6 (six) hours as needed for headache., Disp: , Rfl:  .  Blood Pressure KIT, Monitor BP readings at home regularly O09.90 extra large, Disp: 1 kit, Rfl: 0 .  Doxylamine-Pyridoxine (DICLEGIS) 10-10 MG TBEC, Take 2 tablets by mouth at bedtime. If symptoms persist, add one tablet in the morning and one in the  afternoon, Disp: 100 tablet, Rfl: 5 .  folic acid (FOLVITE) 1 MG tablet, Take 1 tablet (1 mg total) by mouth daily., Disp: 30 tablet, Rfl: 10 .  Prenat w/o A-FeCbn-Meth-FA-DHA (PRENATE MINI) 29-0.6-0.4-350 MG CAPS, Take 1 capsule by mouth daily before breakfast., Disp: 30 capsule, Rfl: 11 .  promethazine (PHENERGAN) 25 MG tablet, Take 1 tablet (25 mg total) by mouth every 6 (six) hours as needed for nausea or vomiting., Disp: 30 tablet, Rfl: 1 .  terconazole (TERAZOL 7) 0.4 % vaginal cream, Place 1 applicator vaginally at bedtime. Use for seven days, Disp: 45 g, Rfl: 0 .  albuterol (PROVENTIL HFA;VENTOLIN HFA) 108 (90 Base) MCG/ACT inhaler, Inhale 2 puffs into the lungs every 6 (six) hours as needed for wheezing or shortness of breath. (Patient not taking: Reported on 07/30/2018), Disp: 1 Inhaler, Rfl: 0 .  levETIRAcetam (KEPPRA) 750 MG tablet, One po bid, Disp: 60 tablet, Rfl: 11  PAST MEDICAL HISTORY: Past Medical History:  Diagnosis Date  . ADHD (attention deficit hyperactivity disorder)   . Anemia   . Anxiety   . Asthma   . Depression   . Headache   . Obesity   . Seizures ([place])    "when I get too hot"  . Trichomonas infection   . UTI (lower urinary tract infection)     PAST SURGICAL HISTORY: Past Surgical History:  Procedure Laterality Date  . ADENOIDECTOMY    . DILATION AND EVACUATION N/A 08/09/2013   Procedure: DILATATION AND EVACUATION;  Surgeon: Frederico Hamman, MD;  Location: [facility];  Service: Gynecology;  Laterality: N/A;  . ORTHOPEDIC SURGERY Left    left hand  . TONSILLECTOMY      FAMILY HISTORY: Family History  Problem Relation Age of Onset  . Seizures Mother   . Stroke Mother   . Hypertension Father   . Diabetes Father   . Diabetes Maternal Grandmother   . Colon cancer Maternal Grandmother   . Hypertension Maternal Grandmother   . Clotting disorder Maternal Grandmother   . Breast cancer Maternal Grandmother   . Stroke Maternal Grandmother   . Diabetes  Paternal Grandmother     SOCIAL HISTORY:  Social History   Socioeconomic History  . Marital status: Single    Spouse name: Not on file  . Number of children: 0  . Years of education: Not on file  . Highest education level: Not on file  Occupational History  . Not on file  Social Needs  . Financial resource strain: Not on file  . Food insecurity    Worry: Not on file    Inability: Not on file  . Transportation needs    Medical: Not on file    Non-medical: Not on file  Tobacco Use  . Smoking status: Passive Smoke Exposure - Never Smoker  . Smokeless tobacco: Never Used  Substance and Sexual Activity  . Alcohol use: No    Alcohol/week: 0.0 standard drinks  . Drug use: No  . Sexual activity: Yes    Partners: Male    Birth control/protection: None    Comment: one partner  Lifestyle  . Physical activity  Days per week: Not on file    Minutes per session: Not on file  . Stress: Not on file  Relationships  . Social Herbalist on phone: Not on file    Gets together: Not on file    Attends religious service: Not on file    Active member of club or organization: Not on file    Attends meetings of clubs or organizations: Not on file    Relationship status: Not on file  . Intimate partner violence    Fear of current or ex partner: Not on file    Emotionally abused: Not on file    Physically abused: Not on file    Forced sexual activity: Not on file  Other Topics Concern  . Not on file  Social History Narrative   Caffeine: tea sometimes   Lives with fiance   Right handed      PHYSICAL EXAM  Vitals:   08/01/18 1046  BP: 125/79  Pulse: 84  Temp: 98.2 F (36.8 C)  Weight: (!) 304 lb 8 oz (138.1 kg)  Height: _0  (1.676 m)    Body mass index is 49.15 kg/m.   General: The patient is well-developed and well-nourished and in no acute distress  HEENT:  Head is [place]/AT.  Sclera are anicteric.  Funduscopic exam shows normal optic discs and retinal  vessels.  Neck: No carotid bruits are noted.  The neck is mildly tender at the occiput.    Cardiovascular: The heart has a regular rate and rhythm with a normal S1 and S2. There were no murmurs, gallops or rubs.    Skin: Extremities are without rash or  edema.  Musculoskeletal:  Back is nontender  Neurologic Exam  Mental status: The patient is alert and oriented x 3 at the time of the examination. The patient has apparent normal recent and remote memory, with an apparently normal attention span and concentration ability.   Speech is normal.  Cranial nerves: Extraocular movements are full. Pupils are equal, round, and reactive to light and accomodation.  Visual fields are full.  Facial symmetry is present. There is good facial sensation to soft touch bilaterally.Facial strength is normal.  Trapezius and sternocleidomastoid strength is normal. No dysarthria is noted.  The tongue is midline, and the patient has symmetric elevation of the soft palate. No obvious hearing deficits are noted.  Motor:  Muscle bulk is normal.   Tone is normal. Strength is  5 / 5 in all 4 extremities.   Sensory: Sensory testing is intact to pinprick, soft touch and vibration sensation in all 4 extremities.  Coordination: Cerebellar testing reveals good finger-nose-finger and heel-to-shin bilaterally.  Gait and station: Station is normal.   Gait is normal. Tandem gait is normal. Romberg is negative.   Reflexes: Deep tendon reflexes are symmetric and normal bilaterally.   Plantar responses are flexor.    DIAGNOSTIC DATA (LABS, IMAGING, TESTING) - I reviewed patient records, labs, notes, testing and imaging myself where available.  Lab Results  Component Value Date   WBC 6.9 07/30/2018   HGB 12.1 07/30/2018   HCT 34.8 07/30/2018   MCV 82 07/30/2018   PLT 340 07/30/2018      Component Value Date/Time   NA 142 11/25/2017 1321   NA 144 08/04/2015 1346   K 3.9 11/25/2017 1321   CL 105 11/25/2017 1321    CO2 28 11/25/2017 1321   GLUCOSE 85 11/25/2017 1321   BUN 10 11/25/2017 1321  BUN 8 08/04/2015 1346   CREATININE 0.65 11/25/2017 1321   CALCIUM 9.0 11/25/2017 1321   PROT 7.7 07/14/2017 1140   PROT 7.1 08/04/2015 1346   ALBUMIN 4.1 07/14/2017 1140   ALBUMIN 4.3 08/04/2015 1346   AST 29 07/14/2017 1140   ALT 33 07/14/2017 1140   ALKPHOS 84 07/14/2017 1140   BILITOT 0.7 07/14/2017 1140   BILITOT 0.3 08/04/2015 1346   GFRNONAA >60 11/25/2017 1321   GFRAA >60 11/25/2017 1321   Lab Results  Component Value Date   CHOL  12/13/2006    122        ATP III CLASSIFICATION:  <200     mg/dL   Desirable  200-239  mg/dL   Borderline High  >=240    mg/dL   High   HDL 30 (L) 12/13/2006   LDLCALC  12/13/2006    76        Total Cholesterol/HDL:CHD Risk Coronary Heart Disease Risk Table                     Men   Women  1/2 Average Risk   3.4   3.3   TRIG 78 12/13/2006   CHOLHDL 4.1 12/13/2006   Lab Results  Component Value Date   HGBA1C 5.6 07/30/2018   Lab Results  Component Value Date   VITAMINB12 309 02/24/2014   Lab Results  Component Value Date   TSH 1.770 03/15/2018       ASSESSMENT AND PLAN  1. 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 Chronic migraine   3. [redacted] weeks gestation of pregnancy     In summary, Ms. Bari is a 23 year old woman who is currently 8 to [redacted] weeks pregnant who is experiencing daily headaches and an increase in her seizure activity.  During pregnancy, levetiracetam is probably the safest antiepileptic agent with lamotrigine also likely relatively safe.  Either would be much better than Dilantin.  Additionally, the Dilantin did not seem to be controlling her seizures as she was still experiencing 5 or 6-year.  I will start her on 750 mg levetiracetam and titrate up as needed.  If we are unable to get seizures under control consider addition of a substitution with lamotrigine.  Levetiracetam will sometimes help her chronic migraine headaches as well.  If  headaches worsen or persist, consider trigger point injections or Botox to avoid systemic medications.  She will return to see me in 4 months or sooner if there are new or worsening neurologic symptoms.  [place] or lamotrigine   A. Felecia Shelling, MD, Gifford Shave 3/46/2194, 7:12 PM Certified in Neurology, Clinical Neurophysiology, Sleep Medicine and Neuroimaging  [facility] [address] [place],  [address] [phone]

## 2018-08-01 NOTE — Progress Notes (Signed)
Patient ID: Latoya Stark, female   DOB: 1995-12-11, 23 y.o.   MRN: [id number] Patient seen and assessed by nursing staff during this encounter. I have reviewed the chart and agree with the documentation and plan.  Emeterio Reeve, MD 08/01/2018 11:11 AM

## 2018-08-03 ENCOUNTER — Inpatient Hospital Stay (HOSPITAL_COMMUNITY)
Admission: AD | Admit: 2018-08-03 | Discharge: 2018-08-03 | Discharge status: Against Medical Advice | Payer: Medicaid Other | Source: Ambulatory Visit | Attending: Obstetrics and Gynecology | Admitting: Obstetrics and Gynecology

## 2018-08-03 ENCOUNTER — Other Ambulatory Visit: Payer: Self-pay

## 2018-08-03 DIAGNOSIS — R109 Unspecified abdominal pain: Secondary | ICD-10-CM | POA: Diagnosis not present

## 2018-08-03 DIAGNOSIS — O99210 Obesity complicating pregnancy, unspecified trimester: Secondary | ICD-10-CM

## 2018-08-03 DIAGNOSIS — O9A211 Injury, poisoning and certain other consequences of external causes complicating pregnancy, first trimester: Secondary | ICD-10-CM

## 2018-08-03 DIAGNOSIS — Z3A09 9 weeks gestation of pregnancy: Secondary | ICD-10-CM | POA: Insufficient documentation

## 2018-08-03 DIAGNOSIS — E669 Obesity, unspecified: Secondary | ICD-10-CM | POA: Insufficient documentation

## 2018-08-03 DIAGNOSIS — Z87898 Personal history of other specified conditions: Secondary | ICD-10-CM

## 2018-08-03 DIAGNOSIS — Z7722 Contact with and (suspected) exposure to environmental tobacco smoke (acute) (chronic): Secondary | ICD-10-CM | POA: Insufficient documentation

## 2018-08-03 DIAGNOSIS — O0990 Supervision of high risk pregnancy, unspecified, unspecified trimester: Secondary | ICD-10-CM

## 2018-08-03 DIAGNOSIS — W19XXXA Unspecified fall, initial encounter: Secondary | ICD-10-CM

## 2018-08-03 DIAGNOSIS — Z880 Allergy status to penicillin: Secondary | ICD-10-CM | POA: Insufficient documentation

## 2018-08-03 DIAGNOSIS — W06XXXA Fall from bed, initial encounter: Secondary | ICD-10-CM | POA: Diagnosis not present

## 2018-08-03 DIAGNOSIS — O99211 Obesity complicating pregnancy, first trimester: Secondary | ICD-10-CM | POA: Insufficient documentation

## 2018-08-03 DIAGNOSIS — O26891 Other specified pregnancy related conditions, first trimester: Secondary | ICD-10-CM | POA: Diagnosis not present

## 2018-08-03 LAB — URINALYSIS, ROUTINE W REFLEX MICROSCOPIC
Bilirubin Urine: NEGATIVE
Glucose, UA: NEGATIVE mg/dL
Hgb urine dipstick: NEGATIVE
Ketones, ur: NEGATIVE mg/dL
Leukocytes,Ua: NEGATIVE
Nitrite: NEGATIVE
Protein, ur: NEGATIVE mg/dL
Specific Gravity, Urine: 1.017 (ref 1.005–1.030)
pH: 6 (ref 5.0–8.0)

## 2018-08-03 LAB — URINE CULTURE, OB REFLEX

## 2018-08-03 LAB — CULTURE, OB URINE

## 2018-08-03 NOTE — MAU Note (Signed)
About 1830 I was sleeping. One of the legs on bed broke and I rolled off the bed and my abdomen hit the floor. No bleeding but having some pain L lower abd.

## 2018-08-03 NOTE — MAU Provider Note (Signed)
Chief Complaint: Fall   None     SUBJECTIVE HPI: Latoya Stark is a 23 y.o. [phone] with IUP at 33w1dby LMP confirmed by 8 week [place] presents to maternity admissions reporting the leg of the bed broke and she rolled off the bed and her abdomen hit the floor.  She is having left lower abdominal pain that is intermittent. She has not tried any treatments. There are no other associated symptoms.   She denies vaginal bleeding.   HPI  Past Medical History:  Diagnosis Date  . ADHD (attention deficit hyperactivity disorder)   . Anemia   . Anxiety   . Asthma   . Depression   . Headache   . Obesity   . Seizures ([place]    "when I get too hot"  . Trichomonas infection   . UTI (lower urinary tract infection)    Past Surgical History:  Procedure Laterality Date  . ADENOIDECTOMY    . DILATION AND EVACUATION N/A 08/09/2013   Procedure: DILATATION AND EVACUATION;  Surgeon: BFrederico Hamman MD;  [facility];  Service: Gynecology;  Laterality: N/A;  . ORTHOPEDIC SURGERY Left    left hand  . TONSILLECTOMY     Social History   Socioeconomic History  . Marital status: Single    Spouse name: Not on file  . Number of children: 0  . Years of education: Not on file  . Highest education level: Not on file  Occupational History  . Not on file  Social Needs  . Financial resource strain: Not on file  . Food insecurity    Worry: Not on file    Inability: Not on file  . Transportation needs    Medical: Not on file    Non-medical: Not on file  Tobacco Use  . Smoking status: Passive Smoke Exposure - Never Smoker  . Smokeless tobacco: Never Used  Substance and Sexual Activity  . Alcohol use: No    Alcohol/week: 0.0 standard drinks  . Drug use: No  . Sexual activity: Yes    Partners: Male    Birth control/protection: None    Comment: one partner  Lifestyle  . Physical activity    Days per week: Not on file    Minutes per session: Not on file  . Stress: Not on file   Relationships  . Social cHerbaliston phone: Not on file    Gets together: Not on file    Attends religious service: Not on file    Active member of club or organization: Not on file    Attends meetings of clubs or organizations: Not on file    Relationship status: Not on file  . Intimate partner violence    Fear of current or ex partner: Not on file    Emotionally abused: Not on file    Physically abused: Not on file    Forced sexual activity: Not on file  Other Topics Concern  . Not on file  Social History Narrative   Caffeine: tea sometimes   Lives with fiance   Right handed    No current facility-administered medications on file prior to encounter.    Current Outpatient Medications on File Prior to Encounter  Medication Sig Dispense Refill  . acetaminophen (TYLENOL) 500 MG tablet Take 1,000 mg by mouth every 6 (six) hours as needed for headache.    . albuterol (PROVENTIL HFA;VENTOLIN HFA) 108 (90 Base) MCG/ACT inhaler Inhale 2 puffs into the  lungs every 6 (six) hours as needed for wheezing or shortness of breath. (Patient not taking: Reported on 07/30/2018) 1 Inhaler 0  . Blood Pressure KIT Monitor BP readings at home regularly O09.90 extra large 1 kit 0  . Doxylamine-Pyridoxine (DICLEGIS) 10-10 MG TBEC Take 2 tablets by mouth at bedtime. If symptoms persist, add one tablet in the morning and one in the afternoon 809 tablet 5  . folic acid (FOLVITE) 1 MG tablet Take 1 tablet (1 mg total) by mouth daily. 30 tablet 10  . levETIRAcetam (KEPPRA) 750 MG tablet One po bid 60 tablet 11  . Prenat w/o A-FeCbn-Meth-FA-DHA (PRENATE MINI) 29-0.6-0.4-350 MG CAPS Take 1 capsule by mouth daily before breakfast. 30 capsule 11  . promethazine (PHENERGAN) 25 MG tablet Take 1 tablet (25 mg total) by mouth every 6 (six) hours as needed for nausea or vomiting. 30 tablet 1  . terconazole (TERAZOL 7) 0.4 % vaginal cream Place 1 applicator vaginally at bedtime. Use for seven days 45 g 0    Allergies  Allergen Reactions  . Milk-Related Compounds Anaphylaxis    Breaks face out *can have 2% milk*  . Mushroom Extract Complex Anaphylaxis  . Penicillins Anaphylaxis    Has patient had a PCN reaction causing immediate rash, facial/tongue/throat swelling, SOB or lightheadedness with hypotension: Yes Has patient had a PCN reaction causing severe rash involving mucus membranes or skin necrosis: No Has patient had a PCN reaction that required hospitalization No Has patient had a PCN reaction occurring within the last 10 years: Yes If all of the above answers are "NO", then may proceed with Cephalosporin use.   [name] Other (See Comments)    Breaks face out  . Latex Itching and Swelling  . Sulfa Antibiotics Swelling    ROS:  Review of Systems  Constitutional: Negative for chills, fatigue and fever.  Respiratory: Negative for shortness of breath.   Cardiovascular: Negative for chest pain.  Gastrointestinal: Positive for abdominal pain.  Genitourinary: Negative for difficulty urinating, dysuria, flank pain, pelvic pain, vaginal bleeding, vaginal discharge and vaginal pain.  Neurological: Negative for dizziness and headaches.  Psychiatric/Behavioral: Negative.      I have reviewed patient's Past Medical Hx, Surgical Hx, Family Hx, Social Hx, medications and allergies.   Physical Exam   Patient Vitals for the past 24 hrs:  BP Temp Pulse Resp Height Weight  08/03/18 0200 102/76 - 86 - - -  08/03/18 0158 - 98.3 F (36.8 C) - 18 _0  (1.676 m) (!) 138.3 kg   Constitutional: Well-developed, well-nourished female in no acute distress.  Cardiovascular: normal rate Respiratory: normal effort GI: Abd soft, non-tender. Pos BS x 4 MS: Extremities nontender, no edema, normal ROM Neurologic: Alert and oriented x 4.  GU: Neg CVAT.     LAB RESULTS Results for orders placed or performed during the hospital encounter of 08/03/18 (from the past 24 hour(s))  Urinalysis,  Routine w reflex microscopic     Status: Abnormal   Collection Time: 08/03/18  2:05 AM  Result Value Ref Range   Color, Urine YELLOW YELLOW   APPearance HAZY (A) CLEAR   Specific Gravity, Urine 1.017 1.005 - 1.030   pH 6.0 5.0 - 8.0   Glucose, UA NEGATIVE NEGATIVE mg/dL   Hgb urine dipstick NEGATIVE NEGATIVE   Bilirubin Urine NEGATIVE NEGATIVE   Ketones, ur NEGATIVE NEGATIVE mg/dL   Protein, ur NEGATIVE NEGATIVE mg/dL   Nitrite NEGATIVE NEGATIVE   Leukocytes,Ua NEGATIVE NEGATIVE  AB/Positive/-- (07/21 1150)  IMAGING US Ob Comp Less 14 Wks  Result Date: 07/25/2018 CLINICAL DATA:  Left lower quadrant pain, spotting EXAM: OBSTETRIC <14 WK ULTRASOUND TECHNIQUE: Transabdominal ultrasound was performed for evaluation of the gestation as well as the maternal uterus and adnexal regions. COMPARISON:  None. FINDINGS: Intrauterine gestational sac: Single Yolk sac:  Visualized Embryo:  Visualized Cardiac Activity: Visualized Heart Rate: 157 bpm MSD:    mm    w     d CRL:   17.9 mm   8 w 1 d                  [place] EDC: 03/05/2019 Subchorionic hemorrhage:  None visualized. Maternal uterus/adnexae: No adnexal mass or free fluid. IMPRESSION: Eight week 1 day intrauterine pregnancy. Fetal heart rate 157 beats per minute. No acute maternal findings. Electronically Signed   By: Rolm Baptise M.D.   On: 07/25/2018 17:35    MAU Management/MDM: Orders Placed This Encounter  Procedures  . US OB Transvaginal  . Urinalysis, Routine w reflex microscopic  . Discharge patient    No orders of the defined types were placed in this encounter.   Pt with confirmed IUP on previous US.  Pt pain resolved while she waited to be called to MAU room.  Outpatient [place] ordered to confirm viability. Pt to f/u at [facility] as scheduled, return to MAU with severe pain or vaginal bleeding. Pt discharged with strict return precautions.  ASSESSMENT 1. Traumatic injury during pregnancy in first trimester   2. Supervision of high risk  pregnancy, antepartum   3. Maternal obesity affecting pregnancy, antepartum   4. History of seizure   5. Fall, initial encounter   6. Abdominal pain during pregnancy in first trimester     PLAN Discharge home Allergies as of 08/03/2018      Reactions   Milk-related Compounds Anaphylaxis   Breaks face out *can have 2% milk*   Mushroom Extract Complex Anaphylaxis   Penicillins Anaphylaxis   Has patient had a PCN reaction causing immediate rash, facial/tongue/throat swelling, SOB or lightheadedness with hypotension: Yes Has patient had a PCN reaction causing severe rash involving mucus membranes or skin necrosis: No Has patient had a PCN reaction that required hospitalization No Has patient had a PCN reaction occurring within the last 10 years: Yes If all of the above answers are "NO", then may proceed with Cephalosporin use.   Cheese Other (See Comments)   Breaks face out   Latex Itching, Swelling   Sulfa Antibiotics Swelling      Medication List    TAKE these medications   acetaminophen 500 MG tablet Commonly known as: TYLENOL Take 1,000 mg by mouth every 6 (six) hours as needed for headache.   albuterol 108 (90 Base) MCG/ACT inhaler Commonly known as: VENTOLIN HFA Inhale 2 puffs into the lungs every 6 (six) hours as needed for wheezing or shortness of breath.   Blood Pressure Kit Monitor BP readings at home regularly O09.90 extra large   Doxylamine-Pyridoxine 10-10 MG Tbec Commonly known as: Diclegis Take 2 tablets by mouth at bedtime. If symptoms persist, add one tablet in the morning and one in the afternoon   folic acid 1 MG tablet Commonly known as: FOLVITE Take 1 tablet (1 mg total) by mouth daily.   levETIRAcetam 750 MG tablet Commonly known as: KEPPRA One po bid   Prenate Mini 29-0.6-0.4-350 MG Caps Take 1 capsule by mouth daily before breakfast.   promethazine 25 MG  tablet Commonly known as: PHENERGAN Take 1 tablet (25 mg total) by mouth every 6 (six)  hours as needed for nausea or vomiting.   terconazole 0.4 % vaginal cream Commonly known as: TERAZOL 7 Place 1 applicator vaginally at bedtime. Use for seven days        Fatima Blank Certified Nurse-Midwife 08/03/2018  4:14 AM

## 2018-08-03 NOTE — MAU Note (Signed)
Went to call pt and pt was not in lobby. Fatima Blank CNM called pt and talked with her regarding fall tonight and plan of care.

## 2018-08-04 ENCOUNTER — Other Ambulatory Visit: Payer: Self-pay | Admitting: Obstetrics & Gynecology

## 2018-08-04 ENCOUNTER — Encounter: Payer: Self-pay | Admitting: Obstetrics & Gynecology

## 2018-08-04 DIAGNOSIS — O2341 Unspecified infection of urinary tract in pregnancy, first trimester: Secondary | ICD-10-CM | POA: Insufficient documentation

## 2018-08-04 MED ORDER — NITROFURANTOIN MONOHYD MACRO 100 MG PO CAPS: 100.0000 mg | ORAL_CAPSULE | Freq: Two times a day (BID) | ORAL | 1 refills | Status: DC

## 2018-08-05 ENCOUNTER — Encounter (HOSPITAL_COMMUNITY): Payer: Self-pay | Admitting: Emergency Medicine

## 2018-08-05 ENCOUNTER — Other Ambulatory Visit: Payer: Self-pay

## 2018-08-05 ENCOUNTER — Inpatient Hospital Stay (EMERGENCY_DEPARTMENT_HOSPITAL)
Admission: AD | Admit: 2018-08-05 | Discharge: 2018-08-06 | Discharge status: Against Medical Advice | Payer: Medicaid Other | Source: Home / Self Care | Admitting: Obstetrics and Gynecology

## 2018-08-05 ENCOUNTER — Emergency Department (HOSPITAL_COMMUNITY)
Admission: EM | Admit: 2018-08-05 | Discharge: 2018-08-05 | Disposition: A | Discharge status: Home / Self Care | Payer: Medicaid Other | Attending: Emergency Medicine | Admitting: Emergency Medicine

## 2018-08-05 ENCOUNTER — Telehealth: Payer: Self-pay | Admitting: Neurology

## 2018-08-05 DIAGNOSIS — O26891 Other specified pregnancy related conditions, first trimester: Secondary | ICD-10-CM | POA: Diagnosis not present

## 2018-08-05 DIAGNOSIS — G40909 Epilepsy, unspecified, not intractable, without status epilepticus: Secondary | ICD-10-CM

## 2018-08-05 DIAGNOSIS — Z3A09 9 weeks gestation of pregnancy: Secondary | ICD-10-CM | POA: Insufficient documentation

## 2018-08-05 DIAGNOSIS — Z5321 Procedure and treatment not carried out due to patient leaving prior to being seen by health care provider: Secondary | ICD-10-CM | POA: Diagnosis not present

## 2018-08-05 DIAGNOSIS — O26899 Other specified pregnancy related conditions, unspecified trimester: Secondary | ICD-10-CM

## 2018-08-05 DIAGNOSIS — Z3491 Encounter for supervision of normal pregnancy, unspecified, first trimester: Secondary | ICD-10-CM

## 2018-08-05 DIAGNOSIS — R569 Unspecified convulsions: Secondary | ICD-10-CM | POA: Diagnosis present

## 2018-08-05 DIAGNOSIS — R109 Unspecified abdominal pain: Secondary | ICD-10-CM

## 2018-08-05 DIAGNOSIS — O99351 Diseases of the nervous system complicating pregnancy, first trimester: Secondary | ICD-10-CM | POA: Insufficient documentation

## 2018-08-05 DIAGNOSIS — Z7722 Contact with and (suspected) exposure to environmental tobacco smoke (acute) (chronic): Secondary | ICD-10-CM | POA: Insufficient documentation

## 2018-08-05 DIAGNOSIS — Z880 Allergy status to penicillin: Secondary | ICD-10-CM | POA: Insufficient documentation

## 2018-08-05 LAB — URINALYSIS, ROUTINE W REFLEX MICROSCOPIC
Bilirubin Urine: NEGATIVE
Glucose, UA: NEGATIVE mg/dL
Hgb urine dipstick: NEGATIVE
Ketones, ur: NEGATIVE mg/dL
Leukocytes,Ua: NEGATIVE
Nitrite: NEGATIVE
Protein, ur: NEGATIVE mg/dL
Specific Gravity, Urine: 1.016 (ref 1.005–1.030)
pH: 6 (ref 5.0–8.0)

## 2018-08-05 LAB — I-STAT BETA HCG BLOOD, ED (MC, WL, AP ONLY): I-stat hCG, quantitative: >2000 m[IU]/mL — ABNORMAL HIGH (ref <5)

## 2018-08-05 LAB — CBG MONITORING, ED: Glucose-Capillary: 104 mg/dL — ABNORMAL HIGH (ref 70–99)

## 2018-08-05 NOTE — Telephone Encounter (Signed)
Pt stopped by office- requesting a call from RN stating her employer is requesting a detailed letter advised the pts limitation at work. Pt would also like to discuss levETIRAcetam (KEPPRA) 750 MG tablet stating she has a few question on the s/e of the med. Please advise

## 2018-08-05 NOTE — MAU Provider Note (Cosign Needed)
Chief Complaint: Abdominal Pain and Seizures   First Provider Initiated Contact with Patient 08/05/18 2002     SUBJECTIVE HPI: Latoya Stark is a 23 y.o. G4P0030 at 86w3dwho presents to Maternity Admissions reporting abdominal pain. Pt has history of seizures. Was recently switched to keppra & seizures have increased. Had 4 seizures today, last one was before 2 pm. At one point she was told she fell and landed on her abdomen. Does not think she hit her head. Mid right abdominal pain that is tender to touch. Denies dysuria, vaginal bleeding, n/v/d, fever, or vaginal discharge. [facility] but signed out AMA b/c she was worried about people coughing in the waiting room & she wanted to make sure her baby was ok.    Past Medical History:  Diagnosis Date  . ADHD (attention deficit hyperactivity disorder)   . Anemia   . Anxiety   . Asthma   . Chronic migraine 08/01/2018  . Depression   . Gonorrhea in female 10/05/2013  . 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 Headache   . Obesity   . [place]    "when [place] get [place] hot"  . Trichomonas infection   . UTI (lower urinary tract infection)    OB History  Gravida Para Term Preterm AB Living  4       3    SAB TAB Ectopic Multiple Live Births  3            # Outcome Date GA Lbr Len/2nd Weight Sex Delivery Anes PTL Lv  4 Current           3 SAB 10/10/14          2 SAB           1 SAB              Birth Comments: System Generated. Please review and update pregnancy details.   Past Surgical History:  Procedure Laterality Date  . ADENOIDECTOMY    . DILATION AND EVACUATION N/A 08/09/2013   Procedure: DILATATION AND EVACUATION;  Surgeon: BFrederico Hamman MD;  [facility];  Service: Gynecology;  Laterality: N/A;  . ORTHOPEDIC SURGERY Left    left hand  . TONSILLECTOMY     Social History   Socioeconomic History  . Marital status: Single    Spouse name: Not on file  . Number of  children: 0  . Years of education: Not on file  . Highest education level: Not on file  Occupational History  . Not on file  Social Needs  . Financial resource strain: Not on file  . Food insecurity    Worry: Not on file    Inability: Not on file  . Transportation needs    Medical: Not on file    Non-medical: Not on file  Tobacco Use  . Smoking status: Passive Smoke Exposure - Never Smoker  . Smokeless tobacco: Never Used  Substance and Sexual Activity  . Alcohol use: No    Alcohol/week: 0.0 standard drinks  . Drug use: No  . Sexual activity: Yes    Partners: Male    Birth control/protection: None    Comment: one partner  Lifestyle  . Physical activity    Days per week: Not on file    Minutes per session: Not on file  . Stress: Not on file  Relationships  . Social [place] phone:  Not on file    Gets together: Not on file    Attends religious service: Not on file    Active member of club or organization: Not on file    Attends meetings of clubs or organizations: Not on file    Relationship status: Not on file  . Intimate partner violence    Fear of current or ex partner: Not on file    Emotionally abused: Not on file    Physically abused: Not on file    Forced sexual activity: Not on file  Other Topics Concern  . Not on file  Social History Narrative   Caffeine: tea sometimes   Lives with fiance   Right handed    Family History  Problem Relation Age of Onset  . Seizures Mother   . Stroke Mother   . Hypertension Father   . Diabetes Father   . Diabetes Maternal Grandmother   . Colon cancer Maternal Grandmother   . Hypertension Maternal Grandmother   . Clotting disorder Maternal Grandmother   . Breast cancer Maternal Grandmother   . Stroke Maternal Grandmother   . Diabetes Paternal Grandmother    No current facility-administered medications on file prior to encounter.    Current Outpatient Medications on File Prior to Encounter  Medication  Sig Dispense Refill  . acetaminophen (TYLENOL) 500 MG tablet Take 1,000 mg by mouth every 6 (six) hours as needed for headache.    . albuterol (PROVENTIL HFA;VENTOLIN HFA) 108 (90 Base) MCG/ACT inhaler Inhale 2 puffs into the lungs every 6 (six) hours as needed for wheezing or shortness of breath. (Patient not taking: Reported on 07/30/2018) 1 Inhaler 0  . Blood Pressure KIT Monitor BP readings at home regularly O09.90 extra large 1 kit 0  . Doxylamine-Pyridoxine (DICLEGIS) 10-10 MG TBEC Take 2 tablets by mouth at bedtime. If symptoms persist, add one tablet in the morning and one in the afternoon 174 tablet 5  . folic acid (FOLVITE) 1 MG tablet Take 1 tablet (1 mg total) by mouth daily. 30 tablet 10  . levETIRAcetam (KEPPRA) 750 MG tablet One po bid 60 tablet 11  . nitrofurantoin, macrocrystal-monohydrate, (MACROBID) 100 MG capsule Take 1 capsule (100 mg total) by mouth 2 (two) times daily. 14 capsule 1  . Prenat w/o A-FeCbn-Meth-FA-DHA (PRENATE MINI) 29-0.6-0.4-350 MG CAPS Take 1 capsule by mouth daily before breakfast. 30 capsule 11  . promethazine (PHENERGAN) 25 MG tablet Take 1 tablet (25 mg total) by mouth every 6 (six) hours as needed for nausea or vomiting. 30 tablet 1  . terconazole (TERAZOL 7) 0.4 % vaginal cream Place 1 applicator vaginally at bedtime. Use for seven days 45 g 0   Allergies  Allergen Reactions  . Milk-Related Compounds Anaphylaxis    Breaks face out *can have 2% milk*  . Mushroom Extract Complex Anaphylaxis  . Penicillins Anaphylaxis    Has patient had a PCN reaction causing immediate rash, facial/tongue/throat swelling, SOB or lightheadedness with hypotension: Yes Has patient had a PCN reaction causing severe rash involving mucus membranes or skin necrosis: No Has patient had a PCN reaction that required hospitalization No Has patient had a PCN reaction occurring within the last 10 years: Yes If all of the above answers are "NO", then may proceed with Cephalosporin  use.   [name] Other (See Comments)    Breaks face out  . Latex Itching and Swelling  . Sulfa Antibiotics Swelling    I have reviewed patient's Past Medical Hx, Surgical  Hx, Family Hx, Social Hx, medications and allergies.   Review of Systems  Constitutional: Negative.   Gastrointestinal: Positive for abdominal pain. Negative for constipation, diarrhea, nausea and vomiting.  Genitourinary: Negative.   Neurological: Positive for seizures. Negative for headaches.    OBJECTIVE Patient Vitals for the past 24 hrs:  BP Temp Temp src Pulse Resp SpO2 Height Weight  08/05/18 2032 113/64 - - 86 - - - -  08/05/18 1943 (!) 76/53 98.4 F (36.9 C) Oral 96 18 100 % _0  (1.651 m) (!) 136.9 kg   Constitutional: Well-developed, well-nourished female in no acute distress.  Cardiovascular: normal rate & rhythm, no murmur Respiratory: normal rate and effort. Lung sounds clear throughout GI: Abd soft, non-tender, Pos BS x 4. No guarding or rebound tenderness MS: Extremities nontender, no edema, normal ROM Neurologic: Alert and oriented x 4.   Pt informed that the ultrasound is considered a limited OB ultrasound and is not intended to be a complete ultrasound exam.  Patient also informed that the ultrasound is not being completed with the intent of assessing for fetal or placental anomalies or any pelvic abnormalities.  Explained that the purpose of today's ultrasound is to assess for  viability.  Patient acknowledges the purpose of the exam and the limitations of the study.  Live IUP. FHR 166 bpm   MAU COURSE Orders Placed This Encounter  Procedures  . Urinalysis, Routine w reflex microscopic   No orders of the defined types were placed in this encounter.   MDM Live IUP seen on bedside ultrasound.  Pt A&O x 4.  Patient agreeable to being transferred to the ED for evaluation r/t her seizures.  S/w Dr. Gilford Raid regarding patient transfer.   ASSESSMENT 1. Abdominal pain affecting pregnancy    2. [redacted] weeks gestation of pregnancy   3. Normal IUP (intrauterine pregnancy) on prenatal ultrasound, first trimester   4. Seizure disorder during pregnancy in first trimester, antepartum [facility])     PLAN Patient transferred to [facility], Junie Panning, NP 08/05/2018  8:32 PM

## 2018-08-05 NOTE — MAU Note (Signed)
Pt transferred to [facility] ED via wheelchair accompanied by transport specialist and FOB.

## 2018-08-05 NOTE — Telephone Encounter (Signed)
Pt stopped by office- requesting to speak with someone unclear why RN or MD hadn't called her yet. Advised again that the phone line are currently down also that MD and RN have been with pts all morning and she would hear from [place] as soon as possible. Pt began crying stating she then wanted to schedule an "emergency appt to have someone talk to her now" advise that we don't have time slots for walk ins or emergency appts. Pt stated "there isn't anyway the doctor can step out of the room and write a letter for me supper fast" advised pt that that wouldn't be fair to the pt he is in the room with and it would take him a few moments to write a detailed note for her. Pt then left FYI

## 2018-08-05 NOTE — MAU Note (Addendum)
Pt here with complaints of left sided abdominal pain/umbilical pain that started a few days ago after her bed broke and she fell out of the bed and seizures. Pt states she was at work and had a total of 4 seizures today. Last seizures were around 1:45pm. Has history of epilepsy-currently taking Keppra BID. States she takes her medication regularly. Was taken to the [facility] ED by EMS but patient left because "people were coughing." Pt denies vaginal bleeding or discharge. Pt just wants to make sure the baby is okay.

## 2018-08-05 NOTE — Telephone Encounter (Signed)
Called, Lvm returning pt call

## 2018-08-05 NOTE — ED Notes (Signed)
Pt sts she wants to leave AMA due to paranoia of getting sick while pregnant in hospital with multiple people in the waiting room. This NT asked pt to stay due to being pregnant and to ensure she is ok. Pt sts "I'm ok i've had these my whole life. i'll just make a doctors appt."

## 2018-08-05 NOTE — MAU Note (Signed)
Report given to ED charge nurse. Transport called to accompany patient to ED

## 2018-08-05 NOTE — ED Triage Notes (Addendum)
Pt reports she is [redacted] weeks pregnant. Pt had 2 witnessed seizures by husband lasting about 1 minute each, while she was sitting in the car. Pt did not fall or hit her head. Pt  EMS reports she was post-ictal for them. Pt alert and oriented does not remember the event. Pt reports she has had a headache for a couple days and just left her neurology office. She reports being compliant with her medication, last seizure approximately 2-3 weeks ago, pt unsure.

## 2018-08-05 NOTE — ED Notes (Signed)
IV removed.

## 2018-08-06 ENCOUNTER — Encounter: Payer: Self-pay | Admitting: *Deleted

## 2018-08-06 ENCOUNTER — Telehealth: Payer: Self-pay

## 2018-08-06 NOTE — Telephone Encounter (Signed)
Called pt, advised letter ready for pick up. Placed up front. She verbalized understanding.  She is applying for stay at home job and waiting to hear back about this.

## 2018-08-06 NOTE — Telephone Encounter (Signed)
Tried calling pt again. Received automated message that call could not be completed at this time. Could not LVM

## 2018-08-06 NOTE — Telephone Encounter (Signed)
Late entry. Return call to pt on 08/05/18 regarding message and lab results  No answer phone went directly to vm Left detailed message for pt to contact the office .

## 2018-08-06 NOTE — Telephone Encounter (Addendum)
I called pt back. She is requesting a letter for her work explaining need for a different shift. She works for [employer]. States she would like 1st shift. Hotter environment brings on seizures/makes HA's worse and states 1st shift would help with this. States she started generic keppra 750 BID 08/02/18. Helping HA's and no seizures. Making her "zoned out" but otherwise tolerating ok. Advised her body is adjusting to a new medication and can take several weeks for her to adjust. She should continue taking and if not any better by then she should let us know. Advised I will call her back to let her know what Dr. Felecia Shelling says about letter.

## 2018-08-06 NOTE — ED Notes (Signed)
Pt called for recheck vitals,no answer, not seen outside.

## 2018-08-06 NOTE — Telephone Encounter (Signed)
Letter printed, waiting on MD signature °

## 2018-08-06 NOTE — Telephone Encounter (Signed)
Spoke with Dr. Felecia Shelling. He was okay to provide letter to pt

## 2018-08-06 NOTE — ED Notes (Addendum)
Pt was advised to stay and to remain in the ER. Pt and family were not wanting to stay because they would rather make an appointment with neurology. Pt asked to rethink leaving but pt and family insisted in leaving.

## 2018-08-07 MED ORDER — LEVETIRACETAM 750 MG PO TABS: 750.0000 mg | ORAL_TABLET | Freq: Three times a day (TID) | ORAL | 3 refills | Status: DC

## 2018-08-07 NOTE — Telephone Encounter (Signed)
Spoke with Dr. Felecia Shelling. He would like her to increase her Keppra to 750mg  po TID, right now she is doing BID. It can take several weeks to build up in her system

## 2018-08-07 NOTE — Telephone Encounter (Signed)
Called pt. She verified she had 4 seizures 08/05/18. Left ED w/o being evaluated. She did not mention this when we spoke about letter yesterday. She was offered stay at home job she applied for and will start 08/16/18. She is going to resign from job at [employer].   Advised Dr. Felecia Shelling would like to increase her generic keppra to 750mg  TID. She is currently taking it BID. I sent in new rx and advised pt to call if she has any further episodes. Did let her know it will take a few weeks to build up in her system. She verbalized understanding.

## 2018-08-07 NOTE — Telephone Encounter (Signed)
[facility](Dr [name]'s office)called and said pt called them and said she had 4 seizures in one day.  Dr Elly Modena wants message relayed that if pt should be seen earlier as a result of the 4 seizures in one day to please have her called to be scheduled before November

## 2018-08-07 NOTE — Addendum Note (Signed)
Addended by: Hope Pigeon on: 08/07/2018 03:50 PM   Modules accepted: Orders

## 2018-08-13 ENCOUNTER — Encounter: Payer: Self-pay | Admitting: *Deleted

## 2018-08-13 ENCOUNTER — Telehealth: Payer: Self-pay

## 2018-08-13 NOTE — Telephone Encounter (Signed)
Spoke with patient who reports that she had a small amount of spotting today. Pt reports that she had intercourse recently. I advised patient that this could be the reason for her spotting and to keep an eye on it. Pt denies cramping. I advised patient that should her spotting turn into bright red bleeding like a period that she should go to the hospital for evaluation. Pt verbalizes understanding.

## 2018-08-14 ENCOUNTER — Telehealth: Payer: Self-pay

## 2018-08-14 ENCOUNTER — Inpatient Hospital Stay (HOSPITAL_COMMUNITY)
Admission: AD | Admit: 2018-08-14 | Discharge: 2018-08-14 | Disposition: A | Discharge status: Home / Self Care | Payer: Medicaid Other | Attending: Obstetrics and Gynecology | Admitting: Obstetrics and Gynecology

## 2018-08-14 ENCOUNTER — Encounter (HOSPITAL_COMMUNITY): Payer: Self-pay

## 2018-08-14 ENCOUNTER — Other Ambulatory Visit: Payer: Self-pay

## 2018-08-14 DIAGNOSIS — O26899 Other specified pregnancy related conditions, unspecified trimester: Secondary | ICD-10-CM

## 2018-08-14 DIAGNOSIS — K920 Hematemesis: Secondary | ICD-10-CM | POA: Diagnosis not present

## 2018-08-14 DIAGNOSIS — Z882 Allergy status to sulfonamides status: Secondary | ICD-10-CM | POA: Diagnosis not present

## 2018-08-14 DIAGNOSIS — O210 Mild hyperemesis gravidarum: Secondary | ICD-10-CM | POA: Insufficient documentation

## 2018-08-14 DIAGNOSIS — Z880 Allergy status to penicillin: Secondary | ICD-10-CM | POA: Diagnosis not present

## 2018-08-14 DIAGNOSIS — O26891 Other specified pregnancy related conditions, first trimester: Secondary | ICD-10-CM | POA: Diagnosis not present

## 2018-08-14 DIAGNOSIS — O219 Vomiting of pregnancy, unspecified: Secondary | ICD-10-CM

## 2018-08-14 DIAGNOSIS — Z3A10 10 weeks gestation of pregnancy: Secondary | ICD-10-CM | POA: Insufficient documentation

## 2018-08-14 DIAGNOSIS — O2341 Unspecified infection of urinary tract in pregnancy, first trimester: Secondary | ICD-10-CM

## 2018-08-14 DIAGNOSIS — R109 Unspecified abdominal pain: Secondary | ICD-10-CM

## 2018-08-14 LAB — CBC WITH DIFFERENTIAL/PLATELET
Abs Immature Granulocytes: 0.03 10*3/uL (ref 0.00–0.07)
Basophils Absolute: 0 10*3/uL (ref 0.0–0.1)
Basophils Relative: 0 %
Eosinophils Absolute: 0.2 10*3/uL (ref 0.0–0.5)
Eosinophils Relative: 2 %
HCT: 40.7 % (ref 36.0–46.0)
Hemoglobin: 13.2 g/dL (ref 12.0–15.0)
Immature Granulocytes: 0 %
Lymphocytes Relative: 27 %
Lymphs Abs: 2.2 10*3/uL (ref 0.7–4.0)
MCH: 28.1 pg (ref 26.0–34.0)
MCHC: 32.4 g/dL (ref 30.0–36.0)
MCV: 86.6 fL (ref 80.0–100.0)
Monocytes Absolute: 0.6 10*3/uL (ref 0.1–1.0)
Monocytes Relative: 7 %
Neutro Abs: 5.2 10*3/uL (ref 1.7–7.7)
Neutrophils Relative %: 64 %
Platelets: 318 10*3/uL (ref 150–400)
RBC: 4.7 MIL/uL (ref 3.87–5.11)
RDW: 13.4 % (ref 11.5–15.5)
WBC: 8.1 10*3/uL (ref 4.0–10.5)
nRBC: 0 % (ref 0.0–0.2)

## 2018-08-14 LAB — COMPREHENSIVE METABOLIC PANEL
ALT: 57 U/L — ABNORMAL HIGH (ref 0–44)
AST: 35 U/L (ref 15–41)
Albumin: 3.8 g/dL (ref 3.5–5.0)
Alkaline Phosphatase: 69 U/L (ref 38–126)
Anion gap: 10 (ref 5–15)
BUN: 6 mg/dL (ref 6–20)
CO2: 24 mmol/L (ref 22–32)
Calcium: 9.6 mg/dL (ref 8.9–10.3)
Chloride: 104 mmol/L (ref 98–111)
Creatinine, Ser: 0.56 mg/dL (ref 0.44–1.00)
GFR calc Af Amer: >60 mL/min (ref >60)
GFR calc non Af Amer: >60 mL/min (ref >60)
Glucose, Bld: 82 mg/dL (ref 70–99)
Potassium: 4.2 mmol/L (ref 3.5–5.1)
Sodium: 138 mmol/L (ref 135–145)
Total Bilirubin: 0.6 mg/dL (ref 0.3–1.2)
Total Protein: 7.2 g/dL (ref 6.5–8.1)

## 2018-08-14 LAB — URINALYSIS, ROUTINE W REFLEX MICROSCOPIC
Bilirubin Urine: NEGATIVE
Glucose, UA: 50 mg/dL — AB
Hgb urine dipstick: NEGATIVE
Ketones, ur: NEGATIVE mg/dL
Leukocytes,Ua: NEGATIVE
Nitrite: POSITIVE — AB
Protein, ur: 30 mg/dL — AB
Specific Gravity, Urine: 1.027 (ref 1.005–1.030)
pH: 6 (ref 5.0–8.0)

## 2018-08-14 MED ORDER — PANTOPRAZOLE SODIUM 40 MG PO TBEC: 40.0000 mg | DELAYED_RELEASE_TABLET | Freq: Every day | ORAL | 1 refills | Status: DC

## 2018-08-14 NOTE — Discharge Instructions (Signed)
Abdominal Pain During Pregnancy  Abdominal pain is common during pregnancy, and has many possible causes. Some causes are more serious than others, and sometimes the cause is not known. Abdominal pain can be a sign that labor is starting. It can also be caused by normal growth and stretching of muscles and ligaments during pregnancy. Always tell your health care provider if you have any abdominal pain. Follow these instructions at home:  Do not have sex or put anything in your vagina until your pain goes away completely.  Get plenty of rest until your pain improves.  Drink enough fluid to keep your urine pale yellow.  Take over-the-counter and prescription medicines only as told by your health care provider.  Keep all follow-up visits as told by your health care provider. This is important. Contact a health care provider if:  Your pain continues or gets worse after resting.  You have lower abdominal pain that: ? Comes and goes at regular intervals. ? Spreads to your back. ? Is similar to menstrual cramps.  You have pain or burning when you urinate. Get help right away if:  You have a fever or chills.  You have vaginal bleeding.  You are leaking fluid from your vagina.  You are passing tissue from your vagina.  You have vomiting or diarrhea that lasts for more than 24 hours.  Your baby is moving less than usual.  You feel very weak or faint.  You have shortness of breath.  You develop severe pain in your upper abdomen. Summary  Abdominal pain is common during pregnancy, and has many possible causes.  If you experience abdominal pain during pregnancy, tell your health care provider right away.  Follow your health care provider's home care instructions and keep all follow-up visits as directed. This information is not intended to replace advice given to you by your health care provider. Make sure you discuss any questions you have with your health care  provider. Document Released: 12/26/2004 Document Revised: 04/15/2018 Document Reviewed: 03/30/2016 Elsevier Patient Education  [place].  Morning Sickness  Morning sickness is when a woman feels nauseous during pregnancy. This nauseous feeling may or may not come with vomiting. It often occurs in the morning, but it can be a problem at any time of day. Morning sickness is most common during the first trimester. In some cases, it may continue throughout pregnancy. Although morning sickness is unpleasant, it is usually harmless unless the woman develops severe and continual vomiting (hyperemesis gravidarum), a condition that requires more intense treatment. What are the causes? The exact cause of this condition is not known, but it seems to be related to normal hormonal changes that occur in pregnancy. What increases the risk? You are more likely to develop this condition if:  You experienced nausea or vomiting before your pregnancy.  You had morning sickness during a previous pregnancy.  You are pregnant with more than one baby, such as twins. What are the signs or symptoms? Symptoms of this condition include:  Nausea.  Vomiting. How is this diagnosed? This condition is usually diagnosed based on your signs and symptoms. How is this treated? In many cases, treatment is not needed for this condition. Making some changes to what you eat may help to control symptoms. Your health care provider may also prescribe or recommend:  Vitamin B6 supplements.  Anti-nausea medicines.  Ginger. Follow these instructions at home: Medicines  Take over-the-counter and prescription medicines only as told by your health care  provider. Do not use any prescription, over-the-counter, or herbal medicines for morning sickness without first talking with your health care provider.  Taking multivitamins before getting pregnant can prevent or decrease the severity of morning sickness in most  women. Eating and drinking  Eat a piece of dry toast or crackers before getting out of bed in the morning.  Eat 5 or 6 small meals a day.  Eat dry and bland foods, such as rice or a baked potato. Foods that are high in carbohydrates are often helpful.  Avoid greasy, fatty, and spicy foods.  Have someone cook for you if the smell of any food causes nausea and vomiting.  If you feel nauseous after taking prenatal vitamins, take the vitamins at night or with a snack.  Snack on protein foods between meals if you are hungry. Nuts, yogurt, and cheese are good options.  Drink fluids throughout the day.  Try ginger ale made with real ginger, ginger tea made from fresh grated ginger, or ginger candies. General instructions  Do not use any products that contain nicotine or tobacco, such as cigarettes and e-cigarettes. If you need help quitting, ask your health care provider.  Get an air purifier to keep the air in your house free of odors.  Get plenty of fresh air.  Try to avoid odors that trigger your nausea.  Consider trying these methods to help relieve symptoms: ? Wearing an acupressure wristband. These wristbands are often worn for seasickness. ? Acupuncture. Contact a health care provider if:  Your home remedies are not working and you need medicine.  You feel dizzy or light-headed.  You are losing weight. Get help right away if:  You have persistent and uncontrolled nausea and vomiting.  You faint.  You have severe pain in your abdomen. Summary  Morning sickness is when a woman feels nauseous during pregnancy. This nauseous feeling may or may not come with vomiting.  Morning sickness is most common during the first trimester.  It often occurs in the morning, but it can be a problem at any time of day.  In many cases, treatment is not needed for this condition. Making some changes to what you eat may help to control symptoms. This information is not intended to  replace advice given to you by your health care provider. Make sure you discuss any questions you have with your health care provider. Document Released: 02/16/2006 Document Revised: 12/08/2016 Document Reviewed: 01/29/2016 Elsevier Patient Education  2020 [employer].

## 2018-08-14 NOTE — MAU Provider Note (Signed)
History     CSN: [id number]  Arrival date and time: 08/14/18 1332   First Provider Initiated Contact with Patient 08/14/18 1814      Chief Complaint  Patient presents with  . Hematemesis   [name] is a 23 y.o. G4P0030 at 73w5dwho presents to MAU for vomiting blood. Pt states that she has struggled with nausea and vomiting since she found out she was pregnant and is not concerned about that today, but is concerned and here in MAU because of vomiting blood. Of note, pt is not actively vomiting in MAU.  Hematemesis Onset: this morning Location: stomach Duration: <24hrs Character: Pt reports she is "vomiting blood," but when asked to describe the vomit, describes it as "yellow" in color. Pt was then asked if she saw any red in her vomit and patient states this morning when she threw up it was "red and chunky." Pt reports that she did eat last night, but did not eat in the AM prior to vomiting. Pt reports she has vomited 3 times today. Pt last ate around 1230 today. Aggravating/Associated: none/right-sided abdominal pain starting yesterday, constant, achey/dull, better than yesterday Relieving: none Treatment: cracker (does not work), propping up with pillows (works)  Pt denies VB, LOF, vaginal discharge/odor/itching. Pt denies constipation, diarrhea, or urinary problems. Pt denies fever, chills, fatigue, sweating or changes in appetite. Pt denies SOB or chest pain. Pt denies dizziness, HA, light-headedness, weakness.  Problems this pregnancy include: seizures. Allergies? PCN, latex, sulfa Current medications/supplements? See med list in chart, takes Diclegis and Phenergan for N/V Prenatal care provider? [facility], next appt 08/27/2018   OB History    Gravida  4   Para      Term      Preterm      AB  3   Living        SAB  3   TAB      Ectopic      Multiple      Live Births              Past Medical History:  Diagnosis Date  . ADHD (attention  deficit hyperactivity disorder)   . Anemia   . Anxiety   . Asthma   . Chronic migraine 08/01/2018  . Depression   . Gonorrhea in female 10/05/2013  . 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  . Headache   . Obesity   . [place]    "when I get too hot"  . Trichomonas infection   . UTI (lower urinary tract infection)     Past Surgical History:  Procedure Laterality Date  . ADENOIDECTOMY    . DILATION AND EVACUATION N/A 08/09/2013   Procedure: DILATATION AND EVACUATION;  Surgeon: BFrederico Hamman MD;  [facility];  Service: Gynecology;  Laterality: N/A;  . ORTHOPEDIC SURGERY Left    left hand  . TONSILLECTOMY      Family History  Problem Relation Age of Onset  . Seizures Mother   . Stroke Mother   . Hypertension Father   . Diabetes Maternal Grandmother   . Colon cancer Maternal Grandmother   . Hypertension Maternal Grandmother   . Clotting disorder Maternal Grandmother   . Breast cancer Maternal Grandmother   . Stroke Maternal Grandmother   . Diabetes Paternal Grandmother   . Seizures Maternal Aunt     Social History   Tobacco Use  . Smoking status: Never Smoker  . Smokeless tobacco: Never Used  Substance Use Topics  . Alcohol use: No    Alcohol/week: 0.0 standard drinks  . Drug use: No    Allergies:  Allergies  Allergen Reactions  . Milk-Related Compounds Anaphylaxis    Breaks face out *can have 2% milk*  . Mushroom Extract Complex Anaphylaxis  . Penicillins Anaphylaxis    Has patient had a PCN reaction causing immediate rash, facial/tongue/throat swelling, SOB or lightheadedness with hypotension: Yes Has patient had a PCN reaction causing severe rash involving mucus membranes or skin necrosis: No Has patient had a PCN reaction that required hospitalization No Has patient had a PCN reaction occurring within the last 10 years: Yes If all of the above answers are "NO", then may proceed with Cephalosporin use.   [name] Other (See Comments)     Breaks face out  . Latex Itching and Swelling  . Sulfa Antibiotics Swelling    No medications prior to admission.    Review of Systems  Constitutional: Negative for chills, diaphoresis, fatigue and fever.  Respiratory: Negative for shortness of breath.   Cardiovascular: Negative for chest pain.  Gastrointestinal: Positive for abdominal pain, nausea and vomiting. Negative for constipation and diarrhea.  Genitourinary: Negative for dysuria, flank pain, frequency, pelvic pain, urgency, vaginal bleeding and vaginal discharge.  Neurological: Negative for dizziness, weakness, light-headedness and headaches.   Physical Exam   Blood pressure 135/88, pulse 96, temperature 99.2 F (37.3 C), temperature source Oral, resp. rate 18, weight (!) 137.4 kg, last menstrual period 05/31/2018, SpO2 100 %.  Patient Vitals for the past 24 hrs:  BP Temp Temp src Pulse Resp SpO2 Weight  08/14/18 2025 135/88 99.2 F (37.3 C) Oral 96 18 100 % -  08/14/18 1729 133/89 98.8 F (37.1 C) Oral 85 18 100 % -  08/14/18 1448 - - - - - - (!) 137.4 kg  08/14/18 1445 109/81 98.3 F (36.8 C) - 82 16 99 % -  08/14/18 1444 - - - - - 100 % -   Physical Exam  Constitutional: She is oriented to person, place, and time. She appears well-developed and well-nourished. No distress.  HENT:  Head: Normocephalic and atraumatic.  Respiratory: Effort normal.  GI: Soft. She exhibits no distension and no mass. There is no abdominal tenderness. There is no rebound, no guarding and no CVA tenderness.    Neurological: She is alert and oriented to person, place, and time.  Skin: Skin is warm and dry. She is not diaphoretic.  Psychiatric: She has a normal mood and affect. Her behavior is normal. Judgment and thought content normal.   Results for orders placed or performed during the hospital encounter of 08/14/18 (from the past 24 hour(s))  Urinalysis, Routine w reflex microscopic     Status: Abnormal   Collection Time: 08/14/18   3:20 PM  Result Value Ref Range   Color, Urine AMBER (A) YELLOW   APPearance HAZY (A) CLEAR   Specific Gravity, Urine 1.027 1.005 - 1.030   pH 6.0 5.0 - 8.0   Glucose, UA 50 (A) NEGATIVE mg/dL   Hgb urine dipstick NEGATIVE NEGATIVE   Bilirubin Urine NEGATIVE NEGATIVE   Ketones, ur NEGATIVE NEGATIVE mg/dL   Protein, ur 30 (A) NEGATIVE mg/dL   Nitrite POSITIVE (A) NEGATIVE   Leukocytes,Ua NEGATIVE NEGATIVE   RBC / HPF 0-5 0 - 5 RBC/hpf   WBC, UA 11-20 0 - 5 WBC/hpf   Bacteria, UA MANY (A) NONE SEEN   Squamous Epithelial / LPF 6-10 0 -  5   Mucus PRESENT   CBC with Differential/Platelet     Status: None   Collection Time: 08/14/18  7:12 PM  Result Value Ref Range   WBC 8.1 4.0 - 10.5 K/uL   RBC 4.70 3.87 - 5.11 MIL/uL   Hemoglobin 13.2 12.0 - 15.0 g/dL   HCT 40.7 36.0 - 46.0 %   MCV 86.6 80.0 - 100.0 fL   MCH 28.1 26.0 - 34.0 pg   MCHC 32.4 30.0 - 36.0 g/dL   RDW 13.4 11.5 - 15.5 %   Platelets 318 150 - 400 K/uL   nRBC 0.0 0.0 - 0.2 %   Neutrophils Relative % 64 %   Neutro Abs 5.2 1.7 - 7.7 K/uL   Lymphocytes Relative 27 %   Lymphs Abs 2.2 0.7 - 4.0 K/uL   Monocytes Relative 7 %   Monocytes Absolute 0.6 0.1 - 1.0 K/uL   Eosinophils Relative 2 %   Eosinophils Absolute 0.2 0.0 - 0.5 K/uL   Basophils Relative 0 %   Basophils Absolute 0.0 0.0 - 0.1 K/uL   Immature Granulocytes 0 %   Abs Immature Granulocytes 0.03 0.00 - 0.07 K/uL  Comprehensive metabolic panel     Status: Abnormal   Collection Time: 08/14/18  7:12 PM  Result Value Ref Range   Sodium 138 135 - 145 mmol/L   Potassium 4.2 3.5 - 5.1 mmol/L   Chloride 104 98 - 111 mmol/L   CO2 24 22 - 32 mmol/L   Glucose, Bld 82 70 - 99 mg/dL   BUN 6 6 - 20 mg/dL   Creatinine, Ser 0.56 0.44 - 1.00 mg/dL   Calcium 9.6 8.9 - 10.3 mg/dL   Total Protein 7.2 6.5 - 8.1 g/dL   Albumin 3.8 3.5 - 5.0 g/dL   AST 35 15 - 41 U/L   ALT 57 (H) 0 - 44 U/L   Alkaline Phosphatase 69 38 - 126 U/L   Total Bilirubin 0.6 0.3 - 1.2 mg/dL    GFR calc non Af Amer >60 >60 mL/min   GFR calc Af Amer >60 >60 mL/min   Anion gap 10 5 - 15   US Ob Comp Less 14 Wks  Result Date: 07/25/2018 CLINICAL DATA:  Left lower quadrant pain, spotting EXAM: OBSTETRIC <14 WK ULTRASOUND TECHNIQUE: Transabdominal ultrasound was performed for evaluation of the gestation as well as the maternal uterus and adnexal regions. COMPARISON:  None. FINDINGS: Intrauterine gestational sac: Single Yolk sac:  Visualized Embryo:  Visualized Cardiac Activity: Visualized Heart Rate: 157 bpm MSD:    mm    w     d CRL:   17.9 mm   8 w 1 d                  [place] EDC: 03/05/2019 Subchorionic hemorrhage:  None visualized. Maternal uterus/adnexae: No adnexal mass or free fluid. IMPRESSION: Eight week 1 day intrauterine pregnancy. Fetal heart rate 157 beats per minute. No acute maternal findings. Electronically Signed   By: Rolm Baptise M.D.   On: 07/25/2018 17:35   MAU Course`  Procedures  MDM -pt reports hematemesis, without vomiting in MAU -abdominal exam negative, temp 98.8 -pt reports she takes Diclegis 2 pills at night and one in the AM -pt has not taken Phenergan since late July, but reports it works for her -IUP confirmed on [place] 07/25/2018 -UA: amber/hazy/50GLU/30PRO/+nitrite/many bacteria - pt was diagnosed with UTI and sent Macrobid ABX on 08/04/2018, but patient reports she did not  yet pick up the antibiotics for her urinary tract infection (OB urine culture shows bacteria are susceptible to Macrobid) -urine culture sent -CBC w/ diff: WNL, WBCs 8.1 -CMP: ALT 57, otherwise WNL, per Dr. Elonda Husky _0 , no additional work-up needed for elevated ALT. -bedside US performed to confirm viability Pt informed that the ultrasound is considered a limited OB ultrasound and is not intended to be a complete ultrasound exam.  Patient also informed that the ultrasound is not being completed with the intent of assessing for fetal or placental anomalies or any pelvic abnormalities.  Explained  that the purpose of today's ultrasound is to assess for  viability, FHR 162.  Patient acknowledges the purpose of the exam and the limitations of the study.   -pt discharged to home in stable condition  Orders Placed This Encounter  Procedures  . Culture, OB Urine    Standing Status:   Standing    Number of Occurrences:   1  . Urinalysis, Routine w reflex microscopic    Standing Status:   Standing    Number of Occurrences:   1  . CBC with Differential/Platelet    Standing Status:   Standing    Number of Occurrences:   1  . Comprehensive metabolic panel    Standing Status:   Standing    Number of Occurrences:   1  . Discharge patient    Order Specific Question:   Discharge disposition    Answer:   01-Home or Self Care [1]    Order Specific Question:   Discharge patient date    Answer:   08/14/2018   Meds ordered this encounter  Medications  . pantoprazole (PROTONIX) 40 MG tablet    Sig: Take 1 tablet (40 mg total) by mouth daily.    Dispense:  30 tablet    Refill:  1    Order Specific Question:   Supervising Provider    Answer:   Aletha Halim [[id number]]   Assessment and Plan   1. Hematemesis with nausea   2. Nausea and vomiting in pregnancy   3. Urinary tract infection affecting care of mother in first trimester, antepartum   4. Abdominal pain affecting pregnancy    Allergies as of 08/14/2018      Reactions   Milk-related Compounds Anaphylaxis   Breaks face out *can have 2% milk*   Mushroom Extract Complex Anaphylaxis   Penicillins Anaphylaxis   Has patient had a PCN reaction causing immediate rash, facial/tongue/throat swelling, SOB or lightheadedness with hypotension: Yes Has patient had a PCN reaction causing severe rash involving mucus membranes or skin necrosis: No Has patient had a PCN reaction that required hospitalization No Has patient had a PCN reaction occurring within the last 10 years: Yes If all of the above answers are "NO", then may proceed with  Cephalosporin use.   Cheese Other (See Comments)   Breaks face out   Latex Itching, Swelling   Sulfa Antibiotics Swelling      Medication List    TAKE these medications   acetaminophen 500 MG tablet Commonly known as: TYLENOL Take 1,000 mg by mouth every 6 (six) hours as needed for headache.   albuterol 108 (90 Base) MCG/ACT inhaler Commonly known as: VENTOLIN HFA Inhale 2 puffs into the lungs every 6 (six) hours as needed for wheezing or shortness of breath.   Blood Pressure Kit Monitor BP readings at home regularly O09.90 extra large   Doxylamine-Pyridoxine 10-10 MG Tbec Commonly known as: Diclegis Take 2  tablets by mouth at bedtime. If symptoms persist, add one tablet in the morning and one in the afternoon   folic acid 1 MG tablet Commonly known as: FOLVITE Take 1 tablet (1 mg total) by mouth daily.   levETIRAcetam 750 MG tablet Commonly known as: KEPPRA Take 1 tablet (750 mg total) by mouth 3 (three) times daily. One po bid   nitrofurantoin (macrocrystal-monohydrate) 100 MG capsule Commonly known as: MACROBID Take 1 capsule (100 mg total) by mouth 2 (two) times daily.   pantoprazole 40 MG tablet Commonly known as: Protonix Take 1 tablet (40 mg total) by mouth daily.   Prenate Mini 29-0.6-0.4-350 MG Caps Take 1 capsule by mouth daily before breakfast.   promethazine 25 MG tablet Commonly known as: PHENERGAN Take 1 tablet (25 mg total) by mouth every 6 (six) hours as needed for nausea or vomiting.   terconazole 0.4 % vaginal cream Commonly known as: TERAZOL 7 Place 1 applicator vaginally at bedtime. Use for seven days      -will call with culture results -pt advised that frequent nausea and vomiting can cause irritation along the esophagus, which may result in minor hematemesis -pt to return to MAU if hematemesis increases/changes/worsens -discussed with patient no concerns for acute abdomen today, ache could be musculoskeletal and related to frequent N/V  -discussed s/sx of acute abdomen, pt to return to MAU if s/sx develop -discussed normal expectations for N/V in pregnancy and when to return to MAU -take Phenergan PRN - if nausea and vomiting are well-controlled, this will reduce the likelihood of bleeding with vomiting from esophageal irritation -RX for Protonix -start ABX, as prescribed -strict hyperemesis/VB/painreturn MAU precautions given -pt discharged to home in stable condition  Elmyra Ricks E Annabella Elford 08/15/2018, 9:04 AM

## 2018-08-14 NOTE — Telephone Encounter (Signed)
Pt called and states that she has been throwing up blood all morning. Pt denies any more vaginal bleeding or cramping, denies any other symptoms. I advised pt to go to the hospital for evaluation. Pt verbalizes understanding.

## 2018-08-14 NOTE — MAU Note (Signed)
.   Latoya Stark is a 23 y.o. at [redacted]w[redacted]d here in MAU reporting: vomiting blood today. Reports vaginal bleeding yesterday with lower abdominal cramping.  LMP: 05/31/18 Onset of complaint: today Pain score: 3 Vitals:   08/14/18 1444 08/14/18 1445  BP:  109/81  Pulse:  82  Resp:  16  Temp:  98.3 F (36.8 C)  SpO2: 100% 99%     FHT: Lab orders placed from triage: UA

## 2018-08-16 LAB — CULTURE, OB URINE: Culture: >=100000 — AB

## 2018-08-21 ENCOUNTER — Encounter: Payer: Medicaid Other | Admitting: Certified Nurse Midwife

## 2018-08-26 ENCOUNTER — Ambulatory Visit (HOSPITAL_COMMUNITY): Payer: Medicaid Other

## 2018-08-27 ENCOUNTER — Encounter: Payer: Self-pay | Admitting: Obstetrics and Gynecology

## 2018-08-27 ENCOUNTER — Ambulatory Visit (INDEPENDENT_AMBULATORY_CARE_PROVIDER_SITE_OTHER): Payer: Medicaid Other | Admitting: Obstetrics and Gynecology

## 2018-08-27 ENCOUNTER — Other Ambulatory Visit: Payer: Self-pay

## 2018-08-27 VITALS — BP 106/71 | HR 93 | Wt 300.9 lb

## 2018-08-27 DIAGNOSIS — O0990 Supervision of high risk pregnancy, unspecified, unspecified trimester: Secondary | ICD-10-CM

## 2018-08-27 DIAGNOSIS — O99210 Obesity complicating pregnancy, unspecified trimester: Secondary | ICD-10-CM

## 2018-08-27 DIAGNOSIS — Z87898 Personal history of other specified conditions: Secondary | ICD-10-CM

## 2018-08-27 DIAGNOSIS — O99211 Obesity complicating pregnancy, first trimester: Secondary | ICD-10-CM

## 2018-08-27 DIAGNOSIS — O0991 Supervision of high risk pregnancy, unspecified, first trimester: Secondary | ICD-10-CM

## 2018-08-27 DIAGNOSIS — Z3A12 12 weeks gestation of pregnancy: Secondary | ICD-10-CM

## 2018-08-27 MED ORDER — ASPIRIN EC 81 MG PO TBEC: 81.0000 mg | DELAYED_RELEASE_TABLET | Freq: Every day | ORAL | 2 refills | Status: DC

## 2018-08-27 NOTE — Progress Notes (Signed)
   PRENATAL VISIT NOTE  Subjective:  Latoya Stark is a 23 y.o. G4P0030 at [redacted]w[redacted]d being seen today for ongoing prenatal care.  She is currently monitored for the following issues for this high-risk pregnancy and has Supervision of high risk pregnancy, antepartum; Maternal obesity affecting pregnancy, antepartum; History of seizure; and UTI (urinary tract infection) in pregnancy in first trimester on their problem list.  Patient reports no complaints.  Contractions: Not present. Vag. Bleeding: None.   . Denies leaking of fluid.   The following portions of the patient's history were reviewed and updated as appropriate: allergies, current medications, past family history, past medical history, past social history, past surgical history and problem list.   Objective:   Vitals:   08/27/18 1612  BP: 106/71  Pulse: 93  Weight: (!) 300 lb 14.4 oz (136.5 kg)    Fetal Status: Fetal Heart Rate (bpm): 147         General:  Alert, oriented and cooperative. Patient is in no acute distress.  Skin: Skin is warm and dry. No rash noted.   Cardiovascular: Normal heart rate noted  Respiratory: Normal respiratory effort, no problems with respiration noted  Abdomen: Soft, gravid, appropriate for gestational age.  Pain/Pressure: Absent     Pelvic: Cervical exam deferred        Extremities: Normal range of motion.  Edema: Trace  Mental Status: Normal mood and affect. Normal behavior. Normal judgment and thought content.   Assessment and Plan:  Pregnancy: [id number] at [redacted]w[redacted]d 1. Supervision of high risk pregnancy, antepartum Panorama today Anatomy ultrasound scheduled on 10/15/18.  AFP next visit  2. History of seizure Continue Keppra, recently increased to TID dosage Patient reports no seizure since July Follow up as scheduled with Neurologist  3. Maternal obesity affecting pregnancy, antepartum Rx ASA provided  Preterm labor symptoms and general obstetric precautions including but not limited to  vaginal bleeding, contractions, leaking of fluid and fetal movement were reviewed in detail with the patient. Please refer to After Visit Summary for other counseling recommendations.   Return in about 4 weeks (around 09/24/2018) for in person, ROB, High risk, AFP.  Future Appointments  Date Time Provider [place]  10/15/2018 10:00 AM [place] [facility]  10/15/2018 10:00 AM [facility] [place] 3 [facility]  11/18/2018  8:00 AM [place], Amy, NP [facility] None    Mora Bellman, MD

## 2018-09-03 ENCOUNTER — Other Ambulatory Visit: Payer: Self-pay

## 2018-09-03 ENCOUNTER — Inpatient Hospital Stay (HOSPITAL_COMMUNITY)
Admission: AD | Admit: 2018-09-03 | Discharge: 2018-09-03 | Disposition: A | Discharge status: Home / Self Care | Payer: Medicaid Other | Attending: Obstetrics and Gynecology | Admitting: Obstetrics and Gynecology

## 2018-09-03 ENCOUNTER — Encounter: Payer: Self-pay | Admitting: Obstetrics and Gynecology

## 2018-09-03 ENCOUNTER — Encounter (HOSPITAL_COMMUNITY): Payer: Self-pay

## 2018-09-03 DIAGNOSIS — Z87898 Personal history of other specified conditions: Secondary | ICD-10-CM

## 2018-09-03 DIAGNOSIS — O99611 Diseases of the digestive system complicating pregnancy, first trimester: Secondary | ICD-10-CM | POA: Insufficient documentation

## 2018-09-03 DIAGNOSIS — Z91011 Allergy to milk products: Secondary | ICD-10-CM | POA: Insufficient documentation

## 2018-09-03 DIAGNOSIS — Z3A01 Less than 8 weeks gestation of pregnancy: Secondary | ICD-10-CM

## 2018-09-03 DIAGNOSIS — O0991 Supervision of high risk pregnancy, unspecified, first trimester: Secondary | ICD-10-CM | POA: Diagnosis not present

## 2018-09-03 DIAGNOSIS — O99210 Obesity complicating pregnancy, unspecified trimester: Secondary | ICD-10-CM

## 2018-09-03 DIAGNOSIS — Z882 Allergy status to sulfonamides status: Secondary | ICD-10-CM | POA: Insufficient documentation

## 2018-09-03 DIAGNOSIS — Z91040 Latex allergy status: Secondary | ICD-10-CM | POA: Diagnosis not present

## 2018-09-03 DIAGNOSIS — K5900 Constipation, unspecified: Secondary | ICD-10-CM | POA: Insufficient documentation

## 2018-09-03 DIAGNOSIS — E669 Obesity, unspecified: Secondary | ICD-10-CM | POA: Insufficient documentation

## 2018-09-03 DIAGNOSIS — Z79899 Other long term (current) drug therapy: Secondary | ICD-10-CM | POA: Insufficient documentation

## 2018-09-03 DIAGNOSIS — R109 Unspecified abdominal pain: Secondary | ICD-10-CM | POA: Insufficient documentation

## 2018-09-03 DIAGNOSIS — Z880 Allergy status to penicillin: Secondary | ICD-10-CM | POA: Insufficient documentation

## 2018-09-03 DIAGNOSIS — Z87440 Personal history of urinary (tract) infections: Secondary | ICD-10-CM | POA: Diagnosis not present

## 2018-09-03 DIAGNOSIS — O26891 Other specified pregnancy related conditions, first trimester: Secondary | ICD-10-CM | POA: Insufficient documentation

## 2018-09-03 DIAGNOSIS — O0990 Supervision of high risk pregnancy, unspecified, unspecified trimester: Secondary | ICD-10-CM

## 2018-09-03 DIAGNOSIS — R569 Unspecified convulsions: Secondary | ICD-10-CM | POA: Insufficient documentation

## 2018-09-03 DIAGNOSIS — O99211 Obesity complicating pregnancy, first trimester: Secondary | ICD-10-CM | POA: Diagnosis not present

## 2018-09-03 DIAGNOSIS — Z3A08 8 weeks gestation of pregnancy: Secondary | ICD-10-CM | POA: Insufficient documentation

## 2018-09-03 LAB — URINALYSIS, ROUTINE W REFLEX MICROSCOPIC
Bilirubin Urine: NEGATIVE
Glucose, UA: NEGATIVE mg/dL
Hgb urine dipstick: NEGATIVE
Ketones, ur: NEGATIVE mg/dL
Leukocytes,Ua: NEGATIVE
Nitrite: NEGATIVE
Protein, ur: NEGATIVE mg/dL
Specific Gravity, Urine: 1.017 (ref 1.005–1.030)
pH: 7 (ref 5.0–8.0)

## 2018-09-03 LAB — WET PREP, GENITAL
Clue Cells Wet Prep HPF POC: NONE SEEN
Sperm: NONE SEEN
Trich, Wet Prep: NONE SEEN
Yeast Wet Prep HPF POC: NONE SEEN

## 2018-09-03 MED ORDER — POLYETHYLENE GLYCOL 3350 17 G PO PACK: 17.0000 g | PACK | Freq: Every day | ORAL | 3 refills | Status: DC

## 2018-09-03 MED ORDER — ACETAMINOPHEN 500 MG PO TABS
1000.0000 mg | ORAL_TABLET | Freq: Once | ORAL | Status: AC
  Administered 2018-09-03: 1000 mg via ORAL
  Filled 2018-09-03: qty 2

## 2018-09-03 NOTE — MAU Provider Note (Signed)
Chief Complaint: Abdominal Pain  First Provider Initiated Contact with Patient 09/03/18 1434      SUBJECTIVE HPI: Latoya Stark is a 23 y.o. G4P0030 at 45w4dby LMP and 8 wk [place] presents to maternity admissions reporting abdominal pain. Patient reports intermittent abdominal pain for the past 2 days but that last night she had difficulty sleeping due to the pain. Pain is mainly located on left side of abdomen but sometimes on right side as well. Reports possible constipation with last BM small early this morning. Denies pain like this previously. Denies cramping. Scant/normal vaginal discharge. Denies concern for STD's but okay with testing. Denies back pain. She has had 3 miscarriages previously and is concerned. This is the longest she has carried a pregnancy to date. Rating at 5/10 now. Took Tylenol 500 mg once yesterday with minimal relief. Reports completing abx course for UTI earlier this pregnancy. She denies vaginal bleeding, vaginal itching/burning, urinary symptoms, h/a, dizziness, n/v, or fever/chills.    Past Medical History:  Diagnosis Date  . ADHD (attention deficit hyperactivity disorder)   . Anemia   . Anxiety   . Asthma   . Chronic migraine 08/01/2018  . Depression   . Gonorrhea in female 10/05/2013  . 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[place] 08/01/2018  . Headache   . Obesity   . Seizures ([place]    "when I get too hot"  . Trichomonas infection   . UTI (lower urinary tract infection)    Past Surgical History:  Procedure Laterality Date  . ADENOIDECTOMY    . DILATION AND EVACUATION N/A 08/09/2013   Procedure: DILATATION AND EVACUATION;  Surgeon: BFrederico Hamman MD;  [facility];  Service: Gynecology;  Laterality: N/A;  . ORTHOPEDIC SURGERY Left    left hand  . TONSILLECTOMY     Social History   Socioeconomic History  . Marital status: Single    Spouse name: Not on file  . Number of children: 0  . Years of education: Not on file  . Highest education level: Not  on file  Occupational History  . Not on file  Social Needs  . Financial resource strain: Not hard at all  . Food insecurity    Worry: Sometimes true    Inability: Sometimes true  . Transportation needs    Medical: No    Non-medical: No  Tobacco Use  . Smoking status: Never Smoker  . Smokeless tobacco: Never Used  Substance and Sexual Activity  . Alcohol use: No    Alcohol/week: 0.0 standard drinks  . Drug use: No  . Sexual activity: Yes    Partners: Male    Birth control/protection: None    Comment: one partner  Lifestyle  . Physical activity    Days per week: 0 days    Minutes per session: Not on file  . Stress: Not at all  Relationships  . Social connections    Talks on phone: More than three times a week    Gets together: More than three times a week    Attends religious service: Never    Active member of club or organization: No    Attends meetings of clubs or organizations: Never    Relationship status: Never married  . Intimate partner violence    Fear of current or ex partner: No    Emotionally abused: No    Physically abused: No    Forced sexual activity: No  Other Topics Concern  . Not on file  Social History Narrative   Caffeine: tea sometimes   Lives with fiance   Right handed    No current facility-administered medications on file prior to encounter.    Current Outpatient Medications on File Prior to Encounter  Medication Sig Dispense Refill  . acetaminophen (TYLENOL) 500 MG tablet Take 1,000 mg by mouth every 6 (six) hours as needed for headache.    . albuterol (PROVENTIL HFA;VENTOLIN HFA) 108 (90 Base) MCG/ACT inhaler Inhale 2 puffs into the lungs every 6 (six) hours as needed for wheezing or shortness of breath. (Patient not taking: Reported on 07/30/2018) 1 Inhaler 0  . aspirin EC 81 MG tablet Take 1 tablet (81 mg total) by mouth daily. Take after 12 weeks for prevention of preeclampsia later in pregnancy 300 tablet 2  . Blood Pressure KIT Monitor  BP readings at home regularly O09.90 extra large 1 kit 0  . Doxylamine-Pyridoxine (DICLEGIS) 10-10 MG TBEC Take 2 tablets by mouth at bedtime. If symptoms persist, add one tablet in the morning and one in the afternoon 233 tablet 5  . folic acid (FOLVITE) 1 MG tablet Take 1 tablet (1 mg total) by mouth daily. 30 tablet 10  . levETIRAcetam (KEPPRA) 750 MG tablet Take 1 tablet (750 mg total) by mouth 3 (three) times daily. One po bid 90 tablet 3  . nitrofurantoin, macrocrystal-monohydrate, (MACROBID) 100 MG capsule Take 1 capsule (100 mg total) by mouth 2 (two) times daily. 14 capsule 1  . pantoprazole (PROTONIX) 40 MG tablet Take 1 tablet (40 mg total) by mouth daily. 30 tablet 1  . Prenat w/o A-FeCbn-Meth-FA-DHA (PRENATE MINI) 29-0.6-0.4-350 MG CAPS Take 1 capsule by mouth daily before breakfast. 30 capsule 11  . promethazine (PHENERGAN) 25 MG tablet Take 1 tablet (25 mg total) by mouth every 6 (six) hours as needed for nausea or vomiting. 30 tablet 1  . terconazole (TERAZOL 7) 0.4 % vaginal cream Place 1 applicator vaginally at bedtime. Use for seven days 45 g 0   Allergies  Allergen Reactions  . Milk-Related Compounds Anaphylaxis    Breaks face out *can have 2% milk*  . Mushroom Extract Complex Anaphylaxis  . Penicillins Anaphylaxis    Has patient had a PCN reaction causing immediate rash, facial/tongue/throat swelling, SOB or lightheadedness with hypotension: Yes Has patient had a PCN reaction causing severe rash involving mucus membranes or skin necrosis: No Has patient had a PCN reaction that required hospitalization No Has patient had a PCN reaction occurring within the last 10 years: Yes If all of the above answers are "NO", then may proceed with Cephalosporin use.   [name] Other (See Comments)    Breaks face out  . Latex Itching and Swelling  . Sulfa Antibiotics Swelling    ROS:  Review of Systems All other systems negative unless noted above in HPI.   I have reviewed  patient's Past Medical Hx, Surgical Hx, Family Hx, Social Hx, medications and allergies.   Physical Exam   Patient Vitals for the past 24 hrs:  BP Temp Temp src Pulse Resp SpO2 Height Weight  09/03/18 1353 110/69 98.1 F (36.7 C) Oral 93 18 100 % '5\' 6"'  (1.676 m) (!) 137.1 kg   Constitutional: Well-developed, well-nourished female in no acute distress. Obese Cardiovascular: normal rate Respiratory: normal effort GI: Abd soft, mild TTP along left and right lower quadrants and mild suprapubic tenderness  MS: Extremities nontender, no edema, normal ROM Neurologic: Alert and oriented x 4.  GU: Neg CVAT. PELVIC  EXAM: Cervix pink, visually closed, without lesion, scant white creamy discharge, vaginal walls and external genitalia normal Bimanual exam: Cervix 0/long/high, firm, anterior, neg CMT, uterus nontender, nonenlarged, adnexa without tenderness, enlargement, or mass  FHT 134 by doppler  LAB RESULTS Results for orders placed or performed during the hospital encounter of 09/03/18 (from the past 24 hour(s))  Urinalysis, Routine w reflex microscopic     Status: None   Collection Time: 09/03/18  2:15 PM  Result Value Ref Range   Color, Urine YELLOW YELLOW   APPearance CLEAR CLEAR   Specific Gravity, Urine 1.017 1.005 - 1.030   pH 7.0 5.0 - 8.0   Glucose, UA NEGATIVE NEGATIVE mg/dL   Hgb urine dipstick NEGATIVE NEGATIVE   Bilirubin Urine NEGATIVE NEGATIVE   Ketones, ur NEGATIVE NEGATIVE mg/dL   Protein, ur NEGATIVE NEGATIVE mg/dL   Nitrite NEGATIVE NEGATIVE   Leukocytes,Ua NEGATIVE NEGATIVE  Wet prep, genital     Status: Abnormal   Collection Time: 09/03/18  3:03 PM   Specimen: Genital  Result Value Ref Range   Yeast Wet Prep HPF POC NONE SEEN NONE SEEN   Trich, Wet Prep NONE SEEN NONE SEEN   Clue Cells Wet Prep HPF POC NONE SEEN NONE SEEN   WBC, Wet Prep HPF POC FEW (A) NONE SEEN   Sperm NONE SEEN     AB/Positive/-- (07/21 1150)  IMAGING No results found. BSUS obtained  with fetal movement and cardiac activity noted. Patient informed that the ultrasound is considered a limited OB ultrasound and is not intended to be a complete ultrasound exam.  Patient also informed that the ultrasound is not being completed with the intent of assessing for fetal or placental anomalies or any pelvic abnormalities.  Explained that the purpose of today's ultrasound is to assess for  viability.  Patient acknowledges the purpose of the exam and the limitations of the study.   MAU Management/MDM: Orders Placed This Encounter  Procedures  . Culture, Urine  . Wet prep, genital  . Urinalysis, Routine w reflex microscopic  . Discharge patient Discharge disposition: 01-Home or Self Care; Discharge patient date: 09/03/2018    Meds ordered this encounter  Medications  . acetaminophen (TYLENOL) tablet 1,000 mg  . polyethylene glycol (MIRALAX / GLYCOLAX) 17 g packet    Sig: Take 17 g by mouth daily.    Dispense:  24 each    Refill:  3    UA without signs of infection. Urine culture ordered as patient with UTI earlier in pregnancy. Wet mount WNL. G/C pending. BSUS provided reassurance to patient. Tylenol given. Symptoms subusided while in MAU. Will prescribe Miralax on discharge for possible constipation. Low suspicion for acute abdominal pain including appendicitis, cholecystitis, ovarian torsion due to bilateral location and intermittent symptoms. Patient now comfortable and felt comfortable discharging home. To f/u with [facility] as scheduled or sooner as needed. Pt discharged with strict return precautions.  ASSESSMENT 1. Abdominal pain during pregnancy in first trimester   2. Supervision of high risk pregnancy, antepartum   3. Maternal obesity affecting pregnancy, antepartum   4. History of seizure   5. Constipation, unspecified constipation type     PLAN Discharge home Allergies as of 09/03/2018      Reactions   Milk-related Compounds Anaphylaxis   Breaks face out *can have 2%  milk*   Mushroom Extract Complex Anaphylaxis   Penicillins Anaphylaxis   Has patient had a PCN reaction causing immediate rash, facial/tongue/throat swelling, SOB or lightheadedness with hypotension:  Yes Has patient had a PCN reaction causing severe rash involving mucus membranes or skin necrosis: No Has patient had a PCN reaction that required hospitalization No Has patient had a PCN reaction occurring within the last 10 years: Yes If all of the above answers are "NO", then may proceed with Cephalosporin use.   Cheese Other (See Comments)   Breaks face out   Latex Itching, Swelling   Sulfa Antibiotics Swelling      Medication List    TAKE these medications   acetaminophen 500 MG tablet Commonly known as: TYLENOL Take 1,000 mg by mouth every 6 (six) hours as needed for headache.   albuterol 108 (90 Base) MCG/ACT inhaler Commonly known as: VENTOLIN HFA Inhale 2 puffs into the lungs every 6 (six) hours as needed for wheezing or shortness of breath.   aspirin EC 81 MG tablet Take 1 tablet (81 mg total) by mouth daily. Take after 12 weeks for prevention of preeclampsia later in pregnancy   Blood Pressure Kit Monitor BP readings at home regularly O09.90 extra large   Doxylamine-Pyridoxine 10-10 MG Tbec Commonly known as: Diclegis Take 2 tablets by mouth at bedtime. If symptoms persist, add one tablet in the morning and one in the afternoon   folic acid 1 MG tablet Commonly known as: FOLVITE Take 1 tablet (1 mg total) by mouth daily.   levETIRAcetam 750 MG tablet Commonly known as: KEPPRA Take 1 tablet (750 mg total) by mouth 3 (three) times daily. One po bid   nitrofurantoin (macrocrystal-monohydrate) 100 MG capsule Commonly known as: MACROBID Take 1 capsule (100 mg total) by mouth 2 (two) times daily.   pantoprazole 40 MG tablet Commonly known as: Protonix Take 1 tablet (40 mg total) by mouth daily.   polyethylene glycol 17 g packet Commonly known as: MIRALAX /  GLYCOLAX Take 17 g by mouth daily.   Prenate Mini 29-0.6-0.4-350 MG Caps Take 1 capsule by mouth daily before breakfast.   promethazine 25 MG tablet Commonly known as: PHENERGAN Take 1 tablet (25 mg total) by mouth every 6 (six) hours as needed for nausea or vomiting.   terconazole 0.4 % vaginal cream Commonly known as: TERAZOL 7 Place 1 applicator vaginally at bedtime. Use for seven days      Follow-up Information    [facility] Follow up.   Specialty: Obstetrics and Gynecology Contact information: [address], [place], [place] [facility] Family Medicine Fellow, [facility] for [facility], [place] [facility] 09/03/2018  3:44 PM

## 2018-09-03 NOTE — MAU Note (Signed)
Having having pains in the left part of her stomach, usually it comes and goes, but it has been staying. Called office and was told to come get checked.  Comes and goes during the day, only really constant and bad, at night. Starts at bottom and goes up the side. Will change side to side, depending on which side she is laying on.

## 2018-09-04 ENCOUNTER — Telehealth: Payer: Self-pay | Admitting: *Deleted

## 2018-09-04 LAB — URINE CULTURE: Culture: NO GROWTH

## 2018-09-04 NOTE — Telephone Encounter (Signed)
Pt called to office for results, genetic test. Pt made aware that Horizon test is normal, Panorama is still pending.  Pt advised to call back later in week to verify if resulted.

## 2018-09-05 ENCOUNTER — Encounter: Payer: Self-pay | Admitting: Obstetrics and Gynecology

## 2018-09-05 LAB — CERVICOVAGINAL ANCILLARY ONLY
Chlamydia: NEGATIVE
Neisseria Gonorrhea: NEGATIVE
Trichomonas: NEGATIVE

## 2018-09-06 ENCOUNTER — Telehealth: Payer: Self-pay | Admitting: *Deleted

## 2018-09-06 NOTE — Telephone Encounter (Signed)
Pt called to office for genetic results. Pt made aware of results and gender.

## 2018-09-17 ENCOUNTER — Encounter: Payer: Self-pay | Admitting: *Deleted

## 2018-09-20 ENCOUNTER — Telehealth: Payer: Self-pay | Admitting: Neurology

## 2018-09-20 NOTE — Telephone Encounter (Addendum)
Pt is requesting another copy of the letter for work.

## 2018-09-23 NOTE — Telephone Encounter (Signed)
Hilda Blades- can you help her with this? It is the letter from 08/06/18. Thank you

## 2018-09-23 NOTE — Telephone Encounter (Signed)
Done mailed on 09/23/18

## 2018-09-24 ENCOUNTER — Ambulatory Visit (INDEPENDENT_AMBULATORY_CARE_PROVIDER_SITE_OTHER): Payer: Medicaid Other | Admitting: Obstetrics and Gynecology

## 2018-09-24 ENCOUNTER — Encounter: Payer: Self-pay | Admitting: Obstetrics and Gynecology

## 2018-09-24 ENCOUNTER — Other Ambulatory Visit: Payer: Self-pay

## 2018-09-24 VITALS — BP 116/81 | HR 92 | Wt 302.0 lb

## 2018-09-24 DIAGNOSIS — Z23 Encounter for immunization: Secondary | ICD-10-CM

## 2018-09-24 DIAGNOSIS — O0990 Supervision of high risk pregnancy, unspecified, unspecified trimester: Secondary | ICD-10-CM

## 2018-09-24 DIAGNOSIS — N3000 Acute cystitis without hematuria: Secondary | ICD-10-CM

## 2018-09-24 DIAGNOSIS — O99212 Obesity complicating pregnancy, second trimester: Secondary | ICD-10-CM

## 2018-09-24 DIAGNOSIS — O0992 Supervision of high risk pregnancy, unspecified, second trimester: Secondary | ICD-10-CM

## 2018-09-24 DIAGNOSIS — Z87898 Personal history of other specified conditions: Secondary | ICD-10-CM

## 2018-09-24 DIAGNOSIS — O99210 Obesity complicating pregnancy, unspecified trimester: Secondary | ICD-10-CM

## 2018-09-24 DIAGNOSIS — Z3A16 16 weeks gestation of pregnancy: Secondary | ICD-10-CM

## 2018-09-24 NOTE — Progress Notes (Signed)
Pt is having some cramping. Discuss bowel movements, Miralax is not working.

## 2018-09-24 NOTE — Progress Notes (Signed)
   PRENATAL VISIT NOTE  Subjective:  Latoya Stark is a 23 y.o. G4P0030 at [redacted]w[redacted]d being seen today for ongoing prenatal care.  She is currently monitored for the following issues for this high-risk pregnancy and has Supervision of high risk pregnancy, antepartum; Maternal obesity affecting pregnancy, antepartum; History of seizure; and UTI (urinary tract infection) in pregnancy in first trimester on their problem list.  Patient reports heartburn, nausea and occasional cramping.  Contractions: Not present. Vag. Bleeding: None.  Movement: Present. Denies leaking of fluid.   The following portions of the patient's history were reviewed and updated as appropriate: allergies, current medications, past family history, past medical history, past social history, past surgical history and problem list.   Objective:   Vitals:   09/24/18 0902  BP: 116/81  Pulse: 92  Weight: (!) 302 lb (137 kg)    Fetal Status:     Movement: Present     General:  Alert, oriented and cooperative. Patient is in no acute distress.  Skin: Skin is warm and dry. No rash noted.   Cardiovascular: Normal heart rate noted  Respiratory: Normal respiratory effort, no problems with respiration noted  Abdomen: Soft, gravid, appropriate for gestational age.  Pain/Pressure: Absent     Pelvic: Cervical exam deferred        Extremities: Normal range of motion.     Mental Status: Normal mood and affect. Normal behavior. Normal judgment and thought content.   Pt informed that the ultrasound is considered a limited OB ultrasound and is not intended to be a complete ultrasound exam.  Patient also informed that the ultrasound is not being completed with the intent of assessing for fetal or placental anomalies or any pelvic abnormalities.  Explained that the purpose of today's ultrasound is to assess for  viability.  Patient acknowledges the purpose of the exam and the limitations of the study.    Bedside US: FHR 163 bpm, fetal  activity noted  Assessment and Plan:  Pregnancy: G4P0030 at [redacted]w[redacted]d  1. Supervision of high risk pregnancy, antepartum Reviewed genetic testing today Counseled regarding risks/benefits of flu vaccine, patient accepts vaccine.   2. Maternal obesity affecting pregnancy, antepartum Cont baby ASA  3. History of seizure Has been upped to 750 mg keppra TID No seizures Has f/u with neuro  4. Acute cystitis without hematuria TOC today   Preterm labor symptoms and general obstetric precautions including but not limited to vaginal bleeding, contractions, leaking of fluid and fetal movement were reviewed in detail with the patient. Please refer to After Visit Summary for other counseling recommendations.   Return in about 4 weeks (around 10/22/2018) for high OB, virtual.  Future Appointments  Date Time Provider [place]  10/15/2018 10:00 AM [place] [facility]  10/15/2018 10:00 AM [facility] [place] 3 [facility]  10/22/2018  8:30 AM Constant, Vickii Chafe, MD [place] None  11/18/2018  8:00 AM Lomax, Amy, NP [facility] None    Sloan Leiter, MD

## 2018-09-26 ENCOUNTER — Telehealth: Payer: Self-pay

## 2018-09-26 ENCOUNTER — Inpatient Hospital Stay (HOSPITAL_COMMUNITY)
Admission: AD | Admit: 2018-09-26 | Discharge: 2018-09-26 | Disposition: A | Discharge status: Home / Self Care | Payer: Medicaid Other | Attending: Obstetrics & Gynecology | Admitting: Obstetrics & Gynecology

## 2018-09-26 ENCOUNTER — Encounter (HOSPITAL_COMMUNITY): Payer: Self-pay

## 2018-09-26 ENCOUNTER — Other Ambulatory Visit: Payer: Self-pay

## 2018-09-26 ENCOUNTER — Inpatient Hospital Stay (HOSPITAL_BASED_OUTPATIENT_CLINIC_OR_DEPARTMENT_OTHER): Payer: Medicaid Other

## 2018-09-26 DIAGNOSIS — O9A212 Injury, poisoning and certain other consequences of external causes complicating pregnancy, second trimester: Secondary | ICD-10-CM | POA: Diagnosis not present

## 2018-09-26 DIAGNOSIS — Z3A16 16 weeks gestation of pregnancy: Secondary | ICD-10-CM | POA: Diagnosis not present

## 2018-09-26 DIAGNOSIS — Y92009 Unspecified place in unspecified non-institutional (private) residence as the place of occurrence of the external cause: Secondary | ICD-10-CM

## 2018-09-26 DIAGNOSIS — O9989 Other specified diseases and conditions complicating pregnancy, childbirth and the puerperium: Secondary | ICD-10-CM

## 2018-09-26 DIAGNOSIS — O0990 Supervision of high risk pregnancy, unspecified, unspecified trimester: Secondary | ICD-10-CM

## 2018-09-26 DIAGNOSIS — W07XXXA Fall from chair, initial encounter: Secondary | ICD-10-CM | POA: Insufficient documentation

## 2018-09-26 DIAGNOSIS — O99212 Obesity complicating pregnancy, second trimester: Secondary | ICD-10-CM | POA: Diagnosis not present

## 2018-09-26 DIAGNOSIS — W19XXXA Unspecified fall, initial encounter: Secondary | ICD-10-CM

## 2018-09-26 DIAGNOSIS — Z7982 Long term (current) use of aspirin: Secondary | ICD-10-CM | POA: Diagnosis not present

## 2018-09-26 DIAGNOSIS — O26892 Other specified pregnancy related conditions, second trimester: Secondary | ICD-10-CM | POA: Diagnosis present

## 2018-09-26 DIAGNOSIS — Z880 Allergy status to penicillin: Secondary | ICD-10-CM | POA: Diagnosis not present

## 2018-09-26 DIAGNOSIS — Y92008 Other place in unspecified non-institutional (private) residence as the place of occurrence of the external cause: Secondary | ICD-10-CM | POA: Diagnosis not present

## 2018-09-26 DIAGNOSIS — O0992 Supervision of high risk pregnancy, unspecified, second trimester: Secondary | ICD-10-CM | POA: Diagnosis not present

## 2018-09-26 DIAGNOSIS — O99210 Obesity complicating pregnancy, unspecified trimester: Secondary | ICD-10-CM

## 2018-09-26 DIAGNOSIS — Z87898 Personal history of other specified conditions: Secondary | ICD-10-CM

## 2018-09-26 LAB — URINE CULTURE

## 2018-09-26 NOTE — Telephone Encounter (Signed)
Pt reports falling out of chair and directly on her stomach at home while working from home Pt notes mild pain and cramping Pt and husband concerned due to miscarriage last year Comfort measures discussed  Pt advised for assurance she can report to MAU.

## 2018-09-26 NOTE — MAU Provider Note (Addendum)
Chief Complaint: Fall   First Provider Initiated Contact with Patient 09/26/18 2115      SUBJECTIVE HPI: Latoya Stark is a 23 y.o. G4P0030 at [redacted]w[redacted]d who presents to maternity admissions reporting she was sitting at her home office chair and the arm of the chair broke causing her to fall out of the chair. She hit her abdomen on her desk as she fell. She reports some pain on her left side and left lower abdomen that is constant and sore.  There are no other symptoms. She has not tried any treatments.     Past Medical History:  Diagnosis Date  . ADHD (attention deficit hyperactivity disorder)   . Anemia   . Anxiety   . Asthma   . Chronic migraine 08/01/2018  . Depression   . Gonorrhea in female 10/05/2013  . 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[facility] 08/01/2018  . Headache   . Obesity   . Seizures ([facility]    "when I get too hot"  . Trichomonas infection   . UTI (lower urinary tract infection)    Past Surgical History:  Procedure Laterality Date  . ADENOIDECTOMY    . DILATION AND EVACUATION N/A 08/09/2013   Procedure: DILATATION AND EVACUATION;  Surgeon: BFrederico Hamman MD;  [facility];  Service: Gynecology;  Laterality: N/A;  . ORTHOPEDIC SURGERY Left    left hand  . TONSILLECTOMY     Social History   Socioeconomic History  . Marital status: Single    Spouse name: Not on file  . Number of children: 0  . Years of education: Not on file  . Highest education level: Not on file  Occupational History  . Not on file  Social Needs  . Financial resource strain: Not hard at all  . Food insecurity    Worry: Sometimes true    Inability: Sometimes true  . Transportation needs    Medical: No    Non-medical: No  Tobacco Use  . Smoking status: Never Smoker  . Smokeless tobacco: Never Used  Substance and Sexual Activity  . Alcohol use: No    Alcohol/week: 0.0 standard drinks  . Drug use: No  . Sexual activity: Yes    Partners: Male    Birth control/protection: None     Comment: one partner  Lifestyle  . Physical activity    Days per week: 0 days    Minutes per session: Not on file  . Stress: Not at all  Relationships  . Social connections    Talks on phone: More than three times a week    Gets together: More than three times a week    Attends religious service: Never    Active member of club or organization: No    Attends meetings of clubs or organizations: Never    Relationship status: Never married  . Intimate partner violence    Fear of current or ex partner: No    Emotionally abused: No    Physically abused: No    Forced sexual activity: No  Other Topics Concern  . Not on file  Social History Narrative   Caffeine: tea sometimes   Lives with fiance   Right handed    No current facility-administered medications on file prior to encounter.    Current Outpatient Medications on File Prior to Encounter  Medication Sig Dispense Refill  . aspirin EC 81 MG tablet Take 1 tablet (81 mg total) by mouth daily. Take after 12 weeks for prevention of  preeclampsia later in pregnancy 300 tablet 2  . Doxylamine-Pyridoxine (DICLEGIS) 10-10 MG TBEC Take 2 tablets by mouth at bedtime. If symptoms persist, add one tablet in the morning and one in the afternoon 867 tablet 5  . folic acid (FOLVITE) 1 MG tablet Take 1 tablet (1 mg total) by mouth daily. 30 tablet 10  . levETIRAcetam (KEPPRA) 750 MG tablet Take 1 tablet (750 mg total) by mouth 3 (three) times daily. One po bid 90 tablet 3  . nitrofurantoin, macrocrystal-monohydrate, (MACROBID) 100 MG capsule Take 1 capsule (100 mg total) by mouth 2 (two) times daily. 14 capsule 1  . pantoprazole (PROTONIX) 40 MG tablet Take 1 tablet (40 mg total) by mouth daily. 30 tablet 1  . polyethylene glycol (MIRALAX / GLYCOLAX) 17 g packet Take 17 g by mouth daily. 24 each 3  . Prenat w/o A-FeCbn-Meth-FA-DHA (PRENATE MINI) 29-0.6-0.4-350 MG CAPS Take 1 capsule by mouth daily before breakfast. 30 capsule 11  . promethazine  (PHENERGAN) 25 MG tablet Take 1 tablet (25 mg total) by mouth every 6 (six) hours as needed for nausea or vomiting. 30 tablet 1  . terconazole (TERAZOL 7) 0.4 % vaginal cream Place 1 applicator vaginally at bedtime. Use for seven days 45 g 0  . acetaminophen (TYLENOL) 500 MG tablet Take 1,000 mg by mouth every 6 (six) hours as needed for headache.    . albuterol (PROVENTIL HFA;VENTOLIN HFA) 108 (90 Base) MCG/ACT inhaler Inhale 2 puffs into the lungs every 6 (six) hours as needed for wheezing or shortness of breath. (Patient not taking: Reported on 07/30/2018) 1 Inhaler 0  . Blood Pressure KIT Monitor BP readings at home regularly O09.90 extra large 1 kit 0   Allergies  Allergen Reactions  . Milk-Related Compounds Anaphylaxis    Breaks face out *can have 2% milk*  . Mushroom Extract Complex Anaphylaxis  . Penicillins Anaphylaxis    Has patient had a PCN reaction causing immediate rash, facial/tongue/throat swelling, SOB or lightheadedness with hypotension: Yes Has patient had a PCN reaction causing severe rash involving mucus membranes or skin necrosis: No Has patient had a PCN reaction that required hospitalization No Has patient had a PCN reaction occurring within the last 10 years: Yes If all of the above answers are "NO", then may proceed with Cephalosporin use.   [name] Other (See Comments)    Breaks face out  . Latex Itching and Swelling  . Sulfa Antibiotics Swelling    ROS:  Review of Systems  Constitutional: Negative for chills, fatigue and fever.  Eyes: Negative for visual disturbance.  Respiratory: Negative for shortness of breath.   Cardiovascular: Negative for chest pain.  Gastrointestinal: Negative for abdominal pain, nausea and vomiting.  Genitourinary: Negative for difficulty urinating, dysuria, flank pain, pelvic pain, vaginal bleeding, vaginal discharge and vaginal pain.  Neurological: Negative for dizziness and headaches.  Psychiatric/Behavioral: Negative.       I have reviewed patient's Past Medical Hx, Surgical Hx, Family Hx, Social Hx, medications and allergies.   Physical Exam   Patient Vitals for the past 24 hrs:  BP Temp Temp src Pulse Resp SpO2 Height Weight  09/26/18 2054 101/77 98 F (36.7 C) Oral (!) 101 18 98 % '5\' 6"'$  (1.676 m) (!) 137 kg   Constitutional: Well-developed, well-nourished female in no acute distress.  Cardiovascular: normal rate Respiratory: normal effort GI: Abd soft, non-tender. Pos BS x 4 MS: Extremities nontender, no edema, normal ROM Neurologic: Alert and oriented x 4.  GU:  Neg CVAT.   FHT 140 by doppler  LAB RESULTS No results found for this or any previous visit (from the past 24 hour(s)).  AB/Positive/-- (07/21 1150)  IMAGING No results found.  MAU Management/MDM: Orders Placed This Encounter  Procedures  . [place] MFM OB Limited  . Urinalysis, Routine w reflex microscopic    No orders of the defined types were placed in this encounter.   FHT wnl, pt reassured.  Pt with pain after hitting abdomen and anterior placenta so MFM Limted OB US ordered.  Report to Hansel Feinstein, CNM with [place] pending.    Fatima Blank Certified Nurse-Midwife 09/26/2018  10:30 PM   US showed reassuring findings   Placenta anterior. Good fluid Discharge home Off work tomorrow Encouraged to return here or to other Urgent Care/ED if she develops worsening of symptoms, increase in pain, fever, or other concerning symptoms.    Seabron Spates, CNM

## 2018-09-26 NOTE — Discharge Instructions (Signed)

## 2018-09-26 NOTE — MAU Note (Signed)
At work and chair leg broke and I fell and my stomach hit the desk- happened at 5:20 pm, now right side of belly is hurting.  No bleeding. No leaking.

## 2018-09-27 LAB — AFP, SERUM, OPEN SPINA BIFIDA
AFP MoM: 1.35
AFP Value: 35.1 ng/mL
Gest. Age on Collection Date: 16.6 weeks
Maternal Age At EDD: 24.7 yr
OSBR Risk 1 IN: 8303
Test Results:: NEGATIVE
Weight: 302 [lb_av]

## 2018-10-15 ENCOUNTER — Ambulatory Visit (HOSPITAL_COMMUNITY): Payer: Medicaid Other | Admitting: *Deleted

## 2018-10-15 ENCOUNTER — Ambulatory Visit: Payer: Medicaid Other

## 2018-10-15 ENCOUNTER — Other Ambulatory Visit (HOSPITAL_COMMUNITY): Payer: Self-pay | Admitting: *Deleted

## 2018-10-15 ENCOUNTER — Other Ambulatory Visit: Payer: Self-pay

## 2018-10-15 ENCOUNTER — Encounter (HOSPITAL_COMMUNITY): Payer: Self-pay

## 2018-10-15 ENCOUNTER — Ambulatory Visit (HOSPITAL_COMMUNITY)
Admission: RE | Admit: 2018-10-15 | Discharge: 2018-10-15 | Disposition: A | Discharge status: Home / Self Care | Payer: Medicaid Other | Source: Ambulatory Visit | Attending: Obstetrics and Gynecology | Admitting: Obstetrics and Gynecology

## 2018-10-15 DIAGNOSIS — O0990 Supervision of high risk pregnancy, unspecified, unspecified trimester: Secondary | ICD-10-CM | POA: Insufficient documentation

## 2018-10-15 DIAGNOSIS — O99891 Other specified diseases and conditions complicating pregnancy: Secondary | ICD-10-CM | POA: Diagnosis not present

## 2018-10-15 DIAGNOSIS — O99210 Obesity complicating pregnancy, unspecified trimester: Secondary | ICD-10-CM

## 2018-10-15 DIAGNOSIS — Z87898 Personal history of other specified conditions: Secondary | ICD-10-CM

## 2018-10-15 DIAGNOSIS — O99352 Diseases of the nervous system complicating pregnancy, second trimester: Secondary | ICD-10-CM | POA: Diagnosis not present

## 2018-10-15 DIAGNOSIS — G40909 Epilepsy, unspecified, not intractable, without status epilepticus: Secondary | ICD-10-CM | POA: Diagnosis not present

## 2018-10-15 DIAGNOSIS — O99212 Obesity complicating pregnancy, second trimester: Secondary | ICD-10-CM

## 2018-10-15 DIAGNOSIS — J45909 Unspecified asthma, uncomplicated: Secondary | ICD-10-CM

## 2018-10-15 DIAGNOSIS — Z3A19 19 weeks gestation of pregnancy: Secondary | ICD-10-CM

## 2018-10-16 ENCOUNTER — Telehealth: Payer: Self-pay | Admitting: *Deleted

## 2018-10-16 NOTE — Telephone Encounter (Signed)
Spoke with pt today about constipation.  Pt statse she is using Miralax, stool softener, fiber diet and apple/prune juice with no relief.  Pt was advised to continue her routine regimen. LM making her aware that she may use an Enema, per Dr Elly Modena, as a urgent need and not use as a routine means of BM's. Advised pt to contact office if any other problems or she may discuss at her appt next week.

## 2018-10-17 ENCOUNTER — Inpatient Hospital Stay (HOSPITAL_COMMUNITY)
Admission: AD | Admit: 2018-10-17 | Discharge: 2018-10-17 | Disposition: A | Discharge status: Home / Self Care | Payer: Medicaid Other | Attending: Obstetrics and Gynecology | Admitting: Obstetrics and Gynecology

## 2018-10-17 ENCOUNTER — Encounter (HOSPITAL_COMMUNITY): Payer: Self-pay | Admitting: *Deleted

## 2018-10-17 ENCOUNTER — Other Ambulatory Visit: Payer: Self-pay

## 2018-10-17 ENCOUNTER — Telehealth: Payer: Self-pay

## 2018-10-17 DIAGNOSIS — K5900 Constipation, unspecified: Secondary | ICD-10-CM | POA: Diagnosis not present

## 2018-10-17 DIAGNOSIS — O26892 Other specified pregnancy related conditions, second trimester: Secondary | ICD-10-CM

## 2018-10-17 DIAGNOSIS — Z79899 Other long term (current) drug therapy: Secondary | ICD-10-CM | POA: Diagnosis not present

## 2018-10-17 DIAGNOSIS — O209 Hemorrhage in early pregnancy, unspecified: Secondary | ICD-10-CM | POA: Insufficient documentation

## 2018-10-17 DIAGNOSIS — O99512 Diseases of the respiratory system complicating pregnancy, second trimester: Secondary | ICD-10-CM | POA: Diagnosis not present

## 2018-10-17 DIAGNOSIS — O99212 Obesity complicating pregnancy, second trimester: Secondary | ICD-10-CM | POA: Insufficient documentation

## 2018-10-17 DIAGNOSIS — B373 Candidiasis of vulva and vagina: Secondary | ICD-10-CM | POA: Insufficient documentation

## 2018-10-17 DIAGNOSIS — O99210 Obesity complicating pregnancy, unspecified trimester: Secondary | ICD-10-CM

## 2018-10-17 DIAGNOSIS — O98812 Other maternal infectious and parasitic diseases complicating pregnancy, second trimester: Secondary | ICD-10-CM | POA: Diagnosis not present

## 2018-10-17 DIAGNOSIS — Z3492 Encounter for supervision of normal pregnancy, unspecified, second trimester: Secondary | ICD-10-CM

## 2018-10-17 DIAGNOSIS — E669 Obesity, unspecified: Secondary | ICD-10-CM | POA: Diagnosis not present

## 2018-10-17 DIAGNOSIS — Z7982 Long term (current) use of aspirin: Secondary | ICD-10-CM | POA: Diagnosis not present

## 2018-10-17 DIAGNOSIS — J45909 Unspecified asthma, uncomplicated: Secondary | ICD-10-CM | POA: Insufficient documentation

## 2018-10-17 DIAGNOSIS — Z3A19 19 weeks gestation of pregnancy: Secondary | ICD-10-CM | POA: Insufficient documentation

## 2018-10-17 DIAGNOSIS — Z87898 Personal history of other specified conditions: Secondary | ICD-10-CM

## 2018-10-17 DIAGNOSIS — O0990 Supervision of high risk pregnancy, unspecified, unspecified trimester: Secondary | ICD-10-CM

## 2018-10-17 LAB — WET PREP, GENITAL
Clue Cells Wet Prep HPF POC: NONE SEEN
Sperm: NONE SEEN
Trich, Wet Prep: NONE SEEN

## 2018-10-17 LAB — CBC
HCT: 32.1 % — ABNORMAL LOW (ref 36.0–46.0)
Hemoglobin: 11.1 g/dL — ABNORMAL LOW (ref 12.0–15.0)
MCH: 30.2 pg (ref 26.0–34.0)
MCHC: 34.6 g/dL (ref 30.0–36.0)
MCV: 87.2 fL (ref 80.0–100.0)
Platelets: 272 10*3/uL (ref 150–400)
RBC: 3.68 MIL/uL — ABNORMAL LOW (ref 3.87–5.11)
RDW: 13.5 % (ref 11.5–15.5)
WBC: 9.5 10*3/uL (ref 4.0–10.5)
nRBC: 0 % (ref 0.0–0.2)

## 2018-10-17 LAB — URINALYSIS, ROUTINE W REFLEX MICROSCOPIC
Bilirubin Urine: NEGATIVE
Glucose, UA: NEGATIVE mg/dL
Hgb urine dipstick: NEGATIVE
Ketones, ur: NEGATIVE mg/dL
Nitrite: NEGATIVE
Protein, ur: NEGATIVE mg/dL
Specific Gravity, Urine: 1.023 (ref 1.005–1.030)
pH: 6 (ref 5.0–8.0)

## 2018-10-17 MED ORDER — TERCONAZOLE 0.4 % VA CREA: 1.0000 | TOPICAL_CREAM | Freq: Every day | VAGINAL | 0 refills | Status: DC

## 2018-10-17 NOTE — Telephone Encounter (Signed)
Pt called and reports that she has had some light pink spotting the last couple days. Pt reports that since she woke up this morning the spotting has turned to bright red bleeding. Pt reports no other symptoms. Advised pt to go to the hospital to be evaluated as soon as possible. Pt verbalizes understanding and says she will go.

## 2018-10-17 NOTE — MAU Provider Note (Signed)
History     CSN: [id number]  Arrival date and time: 10/17/18 1112   First Provider Initiated Contact with Patient 10/17/18 1327     Chief Complaint  Patient presents with  . Vaginal Bleeding   HPI Latoya Stark is a 23 y.o. G4P0030 at 66w6dwho presents to MAU with chief complaint of vaginal spotting for the past two weeks. She states she has only seen blood when she wipes after voiding. She has not seen bleeding in her commode or her underwear and has not felt the need to don a pad to track bleeding.   Patient also complains of two month history of constipation. No "real" bowel movement in two months. Moving small stools each day "gumdrop size". Reports eating noodles and frozen baked chicken today. Compliant with Miralax and increasing hydration as previously advised. She endorses straining for bowel movements throughout the day for up to ten minutes at a time.  She denies dysuria, flank pain, abnormal vaginal discharge, leaking of fluid, fever, falls, or recent illness.    OB History    Gravida  4   Para      Term      Preterm      AB  3   Living        SAB  3   TAB      Ectopic      Multiple      Live Births              Past Medical History:  Diagnosis Date  . ADHD (attention deficit hyperactivity disorder)   . Anemia   . Anxiety   . Asthma   . Chronic migraine 08/01/2018  . Depression   . Gonorrhea in female 10/05/2013  . 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 [place]  . Headache   . Obesity   . [place]    "when I get too hot"  . Trichomonas infection   . UTI (lower urinary tract infection)     Past Surgical History:  Procedure Laterality Date  . ADENOIDECTOMY    . DILATION AND EVACUATION N/A 08/09/2013   Procedure: DILATATION AND EVACUATION;  Surgeon: BFrederico Hamman MD;  [facility];  Service: Gynecology;  Laterality: N/A;  . ORTHOPEDIC SURGERY Left    left hand  . TONSILLECTOMY      Family History  Problem Relation Age  of Onset  . Seizures Mother   . Stroke Mother   . Hypertension Father   . Diabetes Maternal Grandmother   . Colon cancer Maternal Grandmother   . Hypertension Maternal Grandmother   . Clotting disorder Maternal Grandmother   . Breast cancer Maternal Grandmother   . Stroke Maternal Grandmother   . Diabetes Paternal Grandmother   . Seizures Maternal Aunt     Social History   Tobacco Use  . Smoking status: Never Smoker  . Smokeless tobacco: Never Used  Substance Use Topics  . Alcohol use: No    Alcohol/week: 0.0 standard drinks  . Drug use: No    Allergies:  Allergies  Allergen Reactions  . Milk-Related Compounds Anaphylaxis    Breaks face out *can have 2% milk*  . Mushroom Extract Complex Anaphylaxis  . Penicillins Anaphylaxis    Has patient had a PCN reaction causing immediate rash, facial/tongue/throat swelling, SOB or lightheadedness with hypotension: Yes Has patient had a PCN reaction causing severe rash involving mucus membranes or skin necrosis: No Has patient had a PCN reaction that required hospitalization  No Has patient had a PCN reaction occurring within the last 10 years: Yes If all of the above answers are "NO", then may proceed with Cephalosporin use.   [name] Other (See Comments)    Breaks face out  . Latex Itching and Swelling  . Sulfa Antibiotics Swelling    Medications Prior to Admission  Medication Sig Dispense Refill Last Dose  . acetaminophen (TYLENOL) 500 MG tablet Take 1,000 mg by mouth every 6 (six) hours as needed for headache.   Past Week at Unknown time  . albuterol (PROVENTIL HFA;VENTOLIN HFA) 108 (90 Base) MCG/ACT inhaler Inhale 2 puffs into the lungs every 6 (six) hours as needed for wheezing or shortness of breath. 1 Inhaler 0 Past Week at Unknown time  . aspirin EC 81 MG tablet Take 1 tablet (81 mg total) by mouth daily. Take after 12 weeks for prevention of preeclampsia later in pregnancy 300 tablet 2 10/17/2018 at Unknown time  . Blood  Pressure KIT Monitor BP readings at home regularly O09.90 extra large 1 kit 0 10/17/2018 at Unknown time  . Doxylamine-Pyridoxine (DICLEGIS) 10-10 MG TBEC Take 2 tablets by mouth at bedtime. If symptoms persist, add one tablet in the morning and one in the afternoon 100 tablet 5 Past Week at Unknown time  . folic acid (FOLVITE) 1 MG tablet Take 1 tablet (1 mg total) by mouth daily. 30 tablet 10 Past Week at Unknown time  . levETIRAcetam (KEPPRA) 750 MG tablet Take 1 tablet (750 mg total) by mouth 3 (three) times daily. One po bid 90 tablet 3 10/17/2018 at Unknown time  . pantoprazole (PROTONIX) 40 MG tablet Take 1 tablet (40 mg total) by mouth daily. 30 tablet 1 10/17/2018 at Unknown time  . polyethylene glycol (MIRALAX / GLYCOLAX) 17 g packet Take 17 g by mouth daily. 24 each 3 10/17/2018 at Unknown time  . Prenat w/o A-FeCbn-Meth-FA-DHA (PRENATE MINI) 29-0.6-0.4-350 MG CAPS Take 1 capsule by mouth daily before breakfast. 30 capsule 11 10/17/2018 at Unknown time  . promethazine (PHENERGAN) 25 MG tablet Take 1 tablet (25 mg total) by mouth every 6 (six) hours as needed for nausea or vomiting. 30 tablet 1 Past Week at Unknown time  . nitrofurantoin, macrocrystal-monohydrate, (MACROBID) 100 MG capsule Take 1 capsule (100 mg total) by mouth 2 (two) times daily. (Patient not taking: Reported on 10/15/2018) 14 capsule 1   . terconazole (TERAZOL 7) 0.4 % vaginal cream Place 1 applicator vaginally at bedtime. Use for seven days (Patient not taking: Reported on 10/15/2018) 45 g 0     Review of Systems  Constitutional: Negative for chills, fatigue and fever.  Gastrointestinal: Positive for constipation. Negative for abdominal pain.  Genitourinary: Positive for vaginal bleeding. Negative for difficulty urinating, dysuria and flank pain.  Musculoskeletal: Negative for back pain.  All other systems reviewed and are negative.  Physical Exam   Blood pressure 117/89, pulse (!) 104, temperature 98.3 F (36.8 C),  resp. rate 18, height _0  (1.676 m), weight (!) 138.3 kg, last menstrual period 05/31/2018.  Physical Exam  Nursing note and vitals reviewed. Constitutional: She is oriented to person, place, and time. She appears well-developed and well-nourished.  Cardiovascular: Normal rate.  Respiratory: Effort normal and breath sounds normal.  GI: Soft. Bowel sounds are normal. She exhibits no distension. There is no abdominal tenderness. There is no rebound, no guarding and no CVA tenderness.  Genitourinary:    Vaginal discharge present.     Genitourinary Comments: Thick clusters  of discharge visualized at introitus and throughout vaginal vault. Cervix visually closed, confirmed with digital exam. No CMT. No bleeding observed   Neurological: She is alert and oriented to person, place, and time.  Skin: Skin is warm and dry.  Psychiatric: She has a normal mood and affect. Her behavior is normal. Judgment and thought content normal.    MAU Course/MDM  Procedures: sterile speculum exam  --Patient able to have multiple small stools following administration soap suds enema.   Patient Vitals for the past 24 hrs:  BP Temp Pulse Resp Height Weight  10/17/18 1501 (!) 111/57 - 91 16 - -  10/17/18 1138 117/89 98.3 F (36.8 C) (!) 104 18 _0  (1.676 m) (!) 138.3 kg   Orders Placed This Encounter  Procedures  . Wet prep, genital  . Culture, OB Urine  . Urinalysis, Routine w reflex microscopic  . CBC  . Soap suds enema  . Discharge patient   Results for orders placed or performed during the hospital encounter of 10/17/18 (from the past 24 hour(s))  Urinalysis, Routine w reflex microscopic     Status: Abnormal   Collection Time: 10/17/18 12:32 PM  Result Value Ref Range   Color, Urine YELLOW YELLOW   APPearance CLOUDY (A) CLEAR   Specific Gravity, Urine 1.023 1.005 - 1.030   pH 6.0 5.0 - 8.0   Glucose, UA NEGATIVE NEGATIVE mg/dL   Hgb urine dipstick NEGATIVE NEGATIVE   Bilirubin Urine NEGATIVE  NEGATIVE   Ketones, ur NEGATIVE NEGATIVE mg/dL   Protein, ur NEGATIVE NEGATIVE mg/dL   Nitrite NEGATIVE NEGATIVE   Leukocytes,Ua LARGE (A) NEGATIVE   RBC / HPF 0-5 0 - 5 RBC/hpf   WBC, UA 6-10 0 - 5 WBC/hpf   Bacteria, UA FEW (A) NONE SEEN   Squamous Epithelial / LPF 21-50 0 - 5   Mucus PRESENT   CBC     Status: Abnormal   Collection Time: 10/17/18  1:32 PM  Result Value Ref Range   WBC 9.5 4.0 - 10.5 K/uL   RBC 3.68 (L) 3.87 - 5.11 MIL/uL   Hemoglobin 11.1 (L) 12.0 - 15.0 g/dL   HCT 32.1 (L) 36.0 - 46.0 %   MCV 87.2 80.0 - 100.0 fL   MCH 30.2 26.0 - 34.0 pg   MCHC 34.6 30.0 - 36.0 g/dL   RDW 13.5 11.5 - 15.5 %   Platelets 272 150 - 400 K/uL   nRBC 0.0 0.0 - 0.2 %  Wet prep, genital     Status: Abnormal   Collection Time: 10/17/18  1:44 PM  Result Value Ref Range   Yeast Wet Prep HPF POC PRESENT (A) NONE SEEN   Trich, Wet Prep NONE SEEN NONE SEEN   Clue Cells Wet Prep HPF POC NONE SEEN NONE SEEN   WBC, Wet Prep HPF POC MANY (A) NONE SEEN   Sperm NONE SEEN    Meds ordered this encounter  Medications  . terconazole (TERAZOL 7) 0.4 % vaginal cream    Sig: Place 1 applicator vaginally at bedtime. Use for seven days    Dispense:  45 g    Refill:  0    Order Specific Question:   Supervising Provider    Answer:   Sloan Leiter [[id number]]    Assessment and Plan  --23 y.o. G4P0030 at [redacted]w[redacted]d --[id number] by Doppler --Large bowel movement in MAU, continue dietary fiber, Miralax, hydration with water --Vulvovaginal candidiasis, rx to pharmacy --Discharge home in stable  condition  F/U: --[place] 10/22/18  Darlina Rumpf, [place] 10/17/2018, 4:34 PM

## 2018-10-17 NOTE — Discharge Instructions (Signed)

## 2018-10-17 NOTE — MAU Note (Signed)
Pt stated she has been spotting for about a week. Now bleeding heavier. Also c/o constipation. Reports mild abd pain and cramping

## 2018-10-18 LAB — CULTURE, OB URINE: Culture: 50000 — AB

## 2018-10-22 ENCOUNTER — Telehealth: Payer: Medicaid Other | Admitting: Obstetrics and Gynecology

## 2018-10-23 ENCOUNTER — Ambulatory Visit: Payer: Medicaid Other

## 2018-10-31 ENCOUNTER — Other Ambulatory Visit: Payer: Self-pay

## 2018-10-31 DIAGNOSIS — O0990 Supervision of high risk pregnancy, unspecified, unspecified trimester: Secondary | ICD-10-CM

## 2018-10-31 MED ORDER — PANTOPRAZOLE SODIUM 40 MG PO TBEC: 40.0000 mg | DELAYED_RELEASE_TABLET | Freq: Every day | ORAL | 1 refills | Status: DC

## 2018-11-01 ENCOUNTER — Encounter: Payer: Self-pay | Admitting: Medical

## 2018-11-01 ENCOUNTER — Other Ambulatory Visit: Payer: Self-pay

## 2018-11-01 ENCOUNTER — Ambulatory Visit (INDEPENDENT_AMBULATORY_CARE_PROVIDER_SITE_OTHER): Payer: Medicaid Other | Admitting: Medical

## 2018-11-01 DIAGNOSIS — Z87898 Personal history of other specified conditions: Secondary | ICD-10-CM

## 2018-11-01 DIAGNOSIS — M549 Dorsalgia, unspecified: Secondary | ICD-10-CM

## 2018-11-01 DIAGNOSIS — O0990 Supervision of high risk pregnancy, unspecified, unspecified trimester: Secondary | ICD-10-CM

## 2018-11-01 DIAGNOSIS — Z3A22 22 weeks gestation of pregnancy: Secondary | ICD-10-CM

## 2018-11-01 DIAGNOSIS — O99210 Obesity complicating pregnancy, unspecified trimester: Secondary | ICD-10-CM

## 2018-11-01 DIAGNOSIS — O99212 Obesity complicating pregnancy, second trimester: Secondary | ICD-10-CM

## 2018-11-01 DIAGNOSIS — O0992 Supervision of high risk pregnancy, unspecified, second trimester: Secondary | ICD-10-CM

## 2018-11-01 DIAGNOSIS — O99891 Dorsalgia, unspecified: Secondary | ICD-10-CM

## 2018-11-01 MED ORDER — BLOOD PRESSURE KIT: PACK | 0 refills | Status: DC

## 2018-11-01 NOTE — Patient Instructions (Addendum)
Second Trimester of Pregnancy  The second trimester is from week 14 through week 27 (month 4 through 6). This is often the time in pregnancy that you feel your best. Often times, morning sickness has lessened or quit. You may have more energy, and you may get hungry more often. Your unborn baby is growing rapidly. At the end of the sixth month, he or she is about 9 inches long and weighs about 1 pounds. You will likely feel the baby move between 18 and 20 weeks of pregnancy. Follow these instructions at home: Medicines  Take over-the-counter and prescription medicines only as told by your doctor. Some medicines are safe and some medicines are not safe during pregnancy.  Take a prenatal vitamin that contains at least 600 micrograms (mcg) of folic acid.  If you have trouble pooping (constipation), take medicine that will make your stool soft (stool softener) if your doctor approves. Eating and drinking   Eat regular, healthy meals.  Avoid raw meat and uncooked cheese.  If you get low calcium from the food you eat, talk to your doctor about taking a daily calcium supplement.  Avoid foods that are high in fat and sugars, such as fried and sweet foods.  If you feel sick to your stomach (nauseous) or throw up (vomit): ? Eat 4 or 5 small meals a day instead of 3 large meals. ? Try eating a few soda crackers. ? Drink liquids between meals instead of during meals.  To prevent constipation: ? Eat foods that are high in fiber, like fresh fruits and vegetables, whole grains, and beans. ? Drink enough fluids to keep your pee (urine) clear or pale yellow. Activity  Exercise only as told by your doctor. Stop exercising if you start to have cramps.  Do not exercise if it is too hot, too humid, or if you are in a place of great height (high altitude).  Avoid heavy lifting.  Wear low-heeled shoes. Sit and stand up straight.  You can continue to have sex unless your doctor tells you not to.  Relieving pain and discomfort  Wear a good support bra if your breasts are tender.  Take warm water baths (sitz baths) to soothe pain or discomfort caused by hemorrhoids. Use hemorrhoid cream if your doctor approves.  Rest with your legs raised if you have leg cramps or low back pain.  If you develop puffy, bulging veins (varicose veins) in your legs: ? Wear support hose or compression stockings as told by your doctor. ? Raise (elevate) your feet for 15 minutes, 3-4 times a day. ? Limit salt in your food. Prenatal care  Write down your questions. Take them to your prenatal visits.  Keep all your prenatal visits as told by your doctor. This is important. Safety  Wear your seat belt when driving.  Make a list of emergency phone numbers, including numbers for family, friends, the hospital, and police and fire departments. General instructions  Ask your doctor about the right foods to eat or for help finding a counselor, if you need these services.  Ask your doctor about local prenatal classes. Begin classes before month 6 of your pregnancy.  Do not use hot tubs, steam rooms, or saunas.  Do not douche or use tampons or scented sanitary pads.  Do not cross your legs for long periods of time.  Visit your dentist if you have not done so. Use a soft toothbrush to brush your teeth. Floss gently.  Avoid all smoking, herbs,   and alcohol. Avoid drugs that are not approved by your doctor.  Do not use any products that contain nicotine or tobacco, such as cigarettes and e-cigarettes. If you need help quitting, ask your doctor.  Avoid cat litter boxes and soil used by cats. These carry germs that can cause birth defects in the baby and can cause a loss of your baby (miscarriage) or stillbirth. Contact a doctor if:  You have mild cramps or pressure in your lower belly.  You have pain when you pee (urinate).  You have bad smelling fluid coming from your vagina.  You continue to feel  sick to your stomach (nauseous), throw up (vomit), or have watery poop (diarrhea).  You have a nagging pain in your belly area.  You feel dizzy. Get help right away if:  You have a fever.  You are leaking fluid from your vagina.  You have spotting or bleeding from your vagina.  You have severe belly cramping or pain.  You lose or gain weight rapidly.  You have trouble catching your breath and have chest pain.  You notice sudden or extreme puffiness (swelling) of your face, hands, ankles, feet, or legs.  You have not felt the baby move in over an hour.  You have severe headaches that do not go away when you take medicine.  You have trouble seeing. Summary  The second trimester is from week 14 through week 27 (months 4 through 6). This is often the time in pregnancy that you feel your best.  To take care of yourself and your unborn baby, you will need to eat healthy meals, take medicines only if your doctor tells you to do so, and do activities that are safe for you and your baby.  Call your doctor if you get sick or if you notice anything unusual about your pregnancy. Also, call your doctor if you need help with the right food to eat, or if you want to know what activities are safe for you. This information is not intended to replace advice given to you by your health care provider. Make sure you discuss any questions you have with your health care provider. Document Released: 03/22/2009 Document Revised: 04/19/2018 Document Reviewed: 02/01/2016 Elsevier Patient Education  2020 [employer].  Contraception Choices Contraception, also called birth control, means things to use or ways to try not to get pregnant. Hormonal birth control This kind of birth control uses hormones. Here are some types of hormonal birth control:  A tube that is put under skin of the arm (implant). The tube can stay in for as long as 3 years.  Shots to get every 3 months (injections).  Pills to  take every day (birth control pills).  A patch to change 1 time each week for 3 weeks (birth control patch). After that, the patch is taken off for 1 week.  A ring to put in the vagina. The ring is left in for 3 weeks. Then it is taken out of the vagina for 1 week. Then a new ring is put in.  Pills to take after unprotected sex (emergency birth control pills). Barrier birth control Here are some types of barrier birth control:  A thin covering that is put on the penis before sex (female condom). The covering is thrown away after sex.  A soft, loose covering that is put in the vagina before sex (female condom). The covering is thrown away after sex.  A rubber bowl that sits over the cervix (  diaphragm). The bowl must be made for you. The bowl is put into the vagina before sex. The bowl is left in for 6-8 hours after sex. It is taken out within 24 hours.  A small, soft cup that fits over the cervix (cervical cap). The cup must be made for you. The cup can be left in for 6-8 hours after sex. It is taken out within 48 hours.  A sponge that is put into the vagina before sex. It must be left in for at least 6 hours after sex. It must be taken out within 30 hours. Then it is thrown away.  A chemical that kills or stops sperm from getting into the uterus (spermicide). It may be a pill, cream, jelly, or foam to put in the vagina. The chemical should be used at least 10-15 minutes before sex. IUD (intrauterine) birth control An IUD is a small, T-shaped piece of plastic. It is put inside the uterus. There are two kinds:  Hormone IUD. This kind can stay in for 3-5 years.  Copper IUD. This kind can stay in for 10 years. Permanent birth control Here are some types of permanent birth control:  Surgery to block the fallopian tubes.  Having an insert put into each fallopian tube.  Surgery to tie off the tubes that carry sperm (vasectomy). Natural planning birth control Here are some types of natural  planning birth control:  Not having sex on the days the woman could get pregnant.  Using a calendar: ? To keep track of the length of each period. ? To find out what days pregnancy can happen. ? To plan to not have sex on days when pregnancy can happen.  Watching for symptoms of ovulation and not having sex during ovulation. One way the woman can check for ovulation is to check her temperature.  Waiting to have sex until after ovulation. Summary  Contraception, also called birth control, means things to use or ways to try not to get pregnant.  Hormonal methods of birth control include implants, injections, pills, patches, vaginal rings, and emergency birth control pills.  Barrier methods of birth control can include female condoms, female condoms, diaphragms, cervical caps, sponges, and spermicides.  There are two types of IUD (intrauterine device) birth control. An IUD can be put in a woman's uterus to prevent pregnancy for 3-5 years.  Permanent sterilization can be done through a procedure for males, females, or both.  Natural planning methods involve not having sex on the days when the woman could get pregnant. This information is not intended to replace advice given to you by your health care provider. Make sure you discuss any questions you have with your health care provider. Document Released: 10/23/2008 Document Revised: 04/17/2018 Document Reviewed: 01/06/2016 Elsevier Patient Education  2020 [employer].

## 2018-11-01 NOTE — Progress Notes (Signed)
S/w pt for webex visit. Pt reports fetal movement, with occasional pelvic pain, no bleeding. Pt states that she did not receive BP cuff, sent cuff today to [facility]. Pt requests refill on protonix.

## 2018-11-01 NOTE — Progress Notes (Signed)
I connected with Latoya Stark on 11/01/18 at  9:55 AM EDT by: WebEx and verified that I am speaking with the correct person using two identifiers.  Patient is located at home and provider is located at [facility].     The purpose of this virtual visit is to provide medical care while limiting exposure to the novel coronavirus. I discussed the limitations, risks, security and privacy concerns of performing an evaluation and management service by WebEx and the availability of in person appointments. I also discussed with the patient that there may be a patient responsible charge related to this service. By engaging in this virtual visit, you consent to the provision of healthcare.  Additionally, you authorize for your insurance to be billed for the services provided during this visit.  The patient expressed understanding and agreed to proceed.  The following staff members participated in the virtual visit:  Wendall Papa, RN    PRENATAL VISIT NOTE  Subjective:  Latoya Stark is a 23 y.o. G4P0030 at [redacted]w[redacted]d  for phone visit for ongoing prenatal care.  She is currently monitored for the following issues for this high-risk pregnancy and has Supervision of high risk pregnancy, antepartum; Maternal obesity affecting pregnancy, antepartum; History of seizure; and UTI (urinary tract infection) in pregnancy in first trimester on their problem list.  Patient reports round ligament pain.  Contractions: Not present. Vag. Bleeding: None.  Movement: Present. Denies leaking of fluid.   The following portions of the patient's history were reviewed and updated as appropriate: allergies, current medications, past family history, past medical history, past social history, past surgical history and problem list.   Objective:  There were no vitals filed for this visit. Self-Obtained  Fetal Status:     Movement: Present     Assessment and Plan:  Pregnancy: G4P0030 at [redacted]w[redacted]d 1. Supervision of high risk  pregnancy, antepartum - Doing well, no contractions, bleeding or LOF  - Growth [place] scheduled for 12/10/18 - Considering Nexplanon for birth control and [facility] for Peds  2. Back pain affecting pregnancy in second trimester - Discussed Tylenol, hydrotherapy and abdominal binder   3. Maternal obesity affecting pregnancy, antepartum - Taking ASA   4. History of seizure - Last seizure 1.5 months ago, taking Keppra as directed   Preterm labor symptoms and general obstetric precautions including but not limited to vaginal bleeding, contractions, leaking of fluid and fetal movement were reviewed in detail with the patient.  Return in about 4 weeks (around 11/29/2018) for [facility], Virtual.  Future Appointments  Date Time Provider [place]  11/18/2018  8:00 AM Debbora Presto, NP [facility] None  12/10/2018 10:45 AM [facility] NURSE [facility]  12/10/2018 10:45 AM [facility] [place] 2 [facility]     Time spent on virtual visit: 15 minutes  Kerry Hough, PA-C

## 2018-11-11 ENCOUNTER — Inpatient Hospital Stay (HOSPITAL_COMMUNITY)
Admission: AD | Admit: 2018-11-11 | Discharge: 2018-11-11 | Disposition: A | Discharge status: Home / Self Care | Payer: Medicaid Other | Source: Ambulatory Visit | Attending: Obstetrics & Gynecology | Admitting: Obstetrics & Gynecology

## 2018-11-11 ENCOUNTER — Other Ambulatory Visit: Payer: Self-pay

## 2018-11-11 ENCOUNTER — Encounter (HOSPITAL_COMMUNITY): Payer: Self-pay

## 2018-11-11 DIAGNOSIS — K5909 Other constipation: Secondary | ICD-10-CM | POA: Diagnosis not present

## 2018-11-11 DIAGNOSIS — O99012 Anemia complicating pregnancy, second trimester: Secondary | ICD-10-CM | POA: Diagnosis not present

## 2018-11-11 DIAGNOSIS — Z79899 Other long term (current) drug therapy: Secondary | ICD-10-CM | POA: Insufficient documentation

## 2018-11-11 DIAGNOSIS — O26899 Other specified pregnancy related conditions, unspecified trimester: Secondary | ICD-10-CM

## 2018-11-11 DIAGNOSIS — O26892 Other specified pregnancy related conditions, second trimester: Secondary | ICD-10-CM

## 2018-11-11 DIAGNOSIS — R109 Unspecified abdominal pain: Secondary | ICD-10-CM | POA: Diagnosis not present

## 2018-11-11 DIAGNOSIS — D649 Anemia, unspecified: Secondary | ICD-10-CM | POA: Insufficient documentation

## 2018-11-11 DIAGNOSIS — G40409 Other generalized epilepsy and epileptic syndromes, not intractable, without status epilepticus: Secondary | ICD-10-CM | POA: Diagnosis not present

## 2018-11-11 DIAGNOSIS — R102 Pelvic and perineal pain: Secondary | ICD-10-CM | POA: Diagnosis not present

## 2018-11-11 DIAGNOSIS — E669 Obesity, unspecified: Secondary | ICD-10-CM | POA: Insufficient documentation

## 2018-11-11 DIAGNOSIS — Z7982 Long term (current) use of aspirin: Secondary | ICD-10-CM | POA: Diagnosis not present

## 2018-11-11 DIAGNOSIS — O99352 Diseases of the nervous system complicating pregnancy, second trimester: Secondary | ICD-10-CM | POA: Insufficient documentation

## 2018-11-11 DIAGNOSIS — K5900 Constipation, unspecified: Secondary | ICD-10-CM

## 2018-11-11 DIAGNOSIS — O99512 Diseases of the respiratory system complicating pregnancy, second trimester: Secondary | ICD-10-CM | POA: Diagnosis not present

## 2018-11-11 DIAGNOSIS — O99212 Obesity complicating pregnancy, second trimester: Secondary | ICD-10-CM | POA: Insufficient documentation

## 2018-11-11 DIAGNOSIS — Z3A23 23 weeks gestation of pregnancy: Secondary | ICD-10-CM | POA: Insufficient documentation

## 2018-11-11 DIAGNOSIS — J45909 Unspecified asthma, uncomplicated: Secondary | ICD-10-CM | POA: Diagnosis not present

## 2018-11-11 LAB — URINALYSIS, ROUTINE W REFLEX MICROSCOPIC
Bilirubin Urine: NEGATIVE
Glucose, UA: NEGATIVE mg/dL
Hgb urine dipstick: NEGATIVE
Ketones, ur: NEGATIVE mg/dL
Leukocytes,Ua: NEGATIVE
Nitrite: NEGATIVE
Protein, ur: 30 mg/dL — AB
Specific Gravity, Urine: 1.03 (ref 1.005–1.030)
pH: 6 (ref 5.0–8.0)

## 2018-11-11 MED ORDER — CYCLOBENZAPRINE HCL 10 MG PO TABS
10.0000 mg | ORAL_TABLET | Freq: Once | ORAL | Status: AC
  Administered 2018-11-11: 02:00:00 10 mg via ORAL
  Filled 2018-11-11: qty 1

## 2018-11-11 MED ORDER — POLYETHYLENE GLYCOL 3350 17 G PO PACK: 17.0000 g | PACK | Freq: Two times a day (BID) | ORAL | 3 refills | Status: DC

## 2018-11-11 MED ORDER — CYCLOBENZAPRINE HCL 10 MG PO TABS: 10.0000 mg | ORAL_TABLET | Freq: Every evening | ORAL | 2 refills | Status: DC | PRN

## 2018-11-11 MED ORDER — SENNA 8.6 MG PO TABS: 1.0000 | ORAL_TABLET | Freq: Every day | ORAL | 0 refills | Status: DC

## 2018-11-11 MED ORDER — DOCUSATE SODIUM 100 MG PO CAPS: 100.0000 mg | ORAL_CAPSULE | Freq: Two times a day (BID) | ORAL | 2 refills | Status: DC

## 2018-11-11 NOTE — MAU Note (Addendum)
Reports she is having sharp, achy abdominal pains that start on both sides of her abdomen and wraps around to the top of her stomach.  Not sure if these are CTX or not.  States she hasn't felt any movement in 2 days.  Reports no LOF/VB.

## 2018-11-11 NOTE — MAU Provider Note (Signed)
Chief Complaint:  Abdominal Pain   First Provider Initiated Contact with Patient 11/11/18 0133      HPI: Latoya Stark is a 23 y.o. G4P0030 at 10w3dwho presents to maternity admissions reporting pain on both sides of abdomen. She has been having pain similar to this for past month. Intermittently has used tylenol with minimal improvement. This past evening, pain over left and center of abdomen was more severe than normal. Patient reports pain is worse at night and then goes away around 1000. Worse after working. Denies diarrhea, constipation. Denies burning in center of stomach or reflux symptoms. Tolerating PO normally. Denies cramping or ctx. Denies vaginal discharge. She reports good fetal movement, denies LOF, vaginal bleeding, vaginal itching/burning, urinary symptoms, h/a, dizziness, n/v, or fever/chills.    Past Medical History: Past Medical History:  Diagnosis Date  . ADHD (attention deficit hyperactivity disorder)   . Anemia   . Anxiety   . Asthma   . Chronic migraine 08/01/2018  . Depression   . Gonorrhea in female 10/05/2013  . 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HOlivia Lopez de Gutierrez 08/01/2018  . Headache   . Obesity   . Seizures (HC[facility]in    "when I get too hot"  . Trichomonas infection   . UTI (lower urinary tract infection)     Past obstetric history: OB History  Gravida Para Term Preterm AB Living  4       3    SAB TAB Ectopic Multiple Live Births  3            # Outcome Date GA Lbr Len/2nd Weight Sex Delivery Anes PTL Lv  4 Current           3 SAB 10/10/14          2 SAB           1 SAB              Birth Comments: System Generated. Please review and update pregnancy details.    Past Surgical History: Past Surgical History:  Procedure Laterality Date  . ADENOIDECTOMY    . DILATION AND EVACUATION N/A 08/09/2013   Procedure: DILATATION AND EVACUATION;  Surgeon: BFrederico Hamman MD;  [facility];  Service: Gynecology;  Laterality: N/A;  . ORTHOPEDIC SURGERY Left    left hand   . TONSILLECTOMY      Family History: Family History  Problem Relation Age of Onset  . Seizures Mother   . Stroke Mother   . Hypertension Father   . Diabetes Maternal Grandmother   . Colon cancer Maternal Grandmother   . Hypertension Maternal Grandmother   . Clotting disorder Maternal Grandmother   . Breast cancer Maternal Grandmother   . Stroke Maternal Grandmother   . Diabetes Paternal Grandmother   . Seizures Maternal Aunt     Social History: Social History   Tobacco Use  . Smoking status: Never Smoker  . Smokeless tobacco: Never Used  Substance Use Topics  . Alcohol use: No    Alcohol/week: 0.0 standard drinks  . Drug use: No    Allergies:  Allergies  Allergen Reactions  . Milk-Related Compounds Anaphylaxis    Breaks face out *can have 2% milk*  . Mushroom Extract Complex Anaphylaxis  . Penicillins Anaphylaxis    Has patient had a PCN reaction causing immediate rash, facial/tongue/throat swelling, SOB or lightheadedness with hypotension: Yes Has patient had a PCN reaction causing severe rash involving mucus membranes or skin necrosis: No Has patient had a  PCN reaction that required hospitalization No Has patient had a PCN reaction occurring within the last 10 years: Yes If all of the above answers are "NO", then may proceed with Cephalosporin use.   [name] Other (See Comments)    Breaks face out  . Latex Itching and Swelling  . Sulfa Antibiotics Swelling    Meds:  Medications Prior to Admission  Medication Sig Dispense Refill Last Dose  . terconazole (TERAZOL 7) 0.4 % vaginal cream Place 1 applicator vaginally at bedtime. Use for seven days 45 g 0   . acetaminophen (TYLENOL) 500 MG tablet Take 1,000 mg by mouth every 6 (six) hours as needed for headache.     . albuterol (PROVENTIL HFA;VENTOLIN HFA) 108 (90 Base) MCG/ACT inhaler Inhale 2 puffs into the lungs every 6 (six) hours as needed for wheezing or shortness of breath. 1 Inhaler 0   . aspirin EC 81  MG tablet Take 1 tablet (81 mg total) by mouth daily. Take after 12 weeks for prevention of preeclampsia later in pregnancy 300 tablet 2   . Blood Pressure KIT Monitor BP readings at home regularly O09.90 extra large 1 kit 0   . Doxylamine-Pyridoxine (DICLEGIS) 10-10 MG TBEC Take 2 tablets by mouth at bedtime. If symptoms persist, add one tablet in the morning and one in the afternoon 388 tablet 5   . folic acid (FOLVITE) 1 MG tablet Take 1 tablet (1 mg total) by mouth daily. 30 tablet 10   . levETIRAcetam (KEPPRA) 750 MG tablet Take 1 tablet (750 mg total) by mouth 3 (three) times daily. One po bid 90 tablet 3   . pantoprazole (PROTONIX) 40 MG tablet Take 1 tablet (40 mg total) by mouth daily. 30 tablet 1   . Prenat w/o A-FeCbn-Meth-FA-DHA (PRENATE MINI) 29-0.6-0.4-350 MG CAPS Take 1 capsule by mouth daily before breakfast. 30 capsule 11   . promethazine (PHENERGAN) 25 MG tablet Take 1 tablet (25 mg total) by mouth every 6 (six) hours as needed for nausea or vomiting. 30 tablet 1   . [DISCONTINUED] polyethylene glycol (MIRALAX / GLYCOLAX) 17 g packet Take 17 g by mouth daily. 24 each 3     ROS:  Review of Systems All other systems negative unless noted above in HPI.   I have reviewed patient's Past Medical Hx, Surgical Hx, Family Hx, Social Hx, medications and allergies.   Physical Exam   Patient Vitals for the past 24 hrs:  BP Temp Temp src Pulse Resp SpO2 Weight  11/11/18 0154 126/86 - - (!) 102 - - -  11/11/18 0150 - - - - - 100 % -  11/11/18 0145 - - - - - 100 % -  11/11/18 0140 - - - - - 100 % -  11/11/18 0135 - - - - - 100 % -  11/11/18 0130 - - - - - 100 % -  11/11/18 0128 125/80 98.2 F (36.8 C) Oral 100 14 - -  11/11/18 0125 - - - - - 100 % -  11/11/18 0118 - - - - - - (!) 139.5 kg   Constitutional: Well-developed, well-nourished female in no acute distress. Obese. Cardiovascular: normal rate Respiratory: normal effort GI: Abd soft, mild tenderness to palpation on RUQ and  LUQ only, gravid appropriate for gestational age.  MS: Extremities nontender, no edema, normal ROM Neurologic: Alert and oriented x 4.  GU: Neg CVAT.    FHT:  Baseline 140, moderate variability, accelerations present, no decelerations Contractions: None  Labs: Results for orders placed or performed during the hospital encounter of 11/11/18 (from the past 24 hour(s))  Urinalysis, Routine w reflex microscopic     Status: Abnormal   Collection Time: 11/11/18  1:41 AM  Result Value Ref Range   Color, Urine YELLOW YELLOW   APPearance HAZY (A) CLEAR   Specific Gravity, Urine 1.030 1.005 - 1.030   pH 6.0 5.0 - 8.0   Glucose, UA NEGATIVE NEGATIVE mg/dL   Hgb urine dipstick NEGATIVE NEGATIVE   Bilirubin Urine NEGATIVE NEGATIVE   Ketones, ur NEGATIVE NEGATIVE mg/dL   Protein, ur 30 (A) NEGATIVE mg/dL   Nitrite NEGATIVE NEGATIVE   Leukocytes,Ua NEGATIVE NEGATIVE   RBC / HPF 0-5 0 - 5 RBC/hpf   WBC, UA 0-5 0 - 5 WBC/hpf   Bacteria, UA RARE (A) NONE SEEN   Squamous Epithelial / LPF 11-20 0 - 5   Mucus PRESENT    AB/Positive/-- (07/21 1150)  Imaging:  [place] Mfm Ob Detail +14 Wk  Result Date: 10/15/2018 ----------------------------------------------------------------------  OBSTETRICS REPORT                       (Signed Final 10/15/2018 11:44 am) ---------------------------------------------------------------------- Patient Info  ID #:       [id number]                          D.O.B.:  [birth year] (23 yrs)  Name:       [name]                 Visit Date: 10/15/2018 10:07 am ---------------------------------------------------------------------- Performed By  Performed By:     Wilnette Kales        Referred By:      MAU Nursing-                    RDMS,RVT                                 MAU/Triage  Attending:        Sander Nephew      [facility]                    MD                                       [facility]  ---------------------------------------------------------------------- Orders   #  Description                          Code         Ordered By   1  [place] MFM OB DETAIL +14 WK              35329.92     [place]  ----------------------------------------------------------------------   #  Order #                    Accession #                 Episode #   1  [id number]                  [id number]  [id number]  ---------------------------------------------------------------------- Indications   Obesity complicating pregnancy, second         O99.212   trimester (49.09 BMI)   Encounter for antenatal screening for          Z36.3   malformations   Low risk,  FF:7.4 %, Normal AFP   Seizure disorder                               O99.350 G40.909   [redacted] weeks gestation of pregnancy                Z3A.19   Asthma                                         O99.89 j45.909  ---------------------------------------------------------------------- Vital Signs  Weight (lb): 302                               Height:        5'6"  BMI:         48.74 ---------------------------------------------------------------------- Fetal Evaluation  Num Of Fetuses:         1  Fetal Heart Rate(bpm):  138  Cardiac Activity:       Observed  Presentation:           Cephalic  Placenta:               Anterior  P. Cord Insertion:      Visualized, central  Amniotic Fluid  AFI FV:      Within normal limits                              Largest Pocket(cm)                              5.35  Comment:    No placental abruption or previa identified. ---------------------------------------------------------------------- Biometry  BPD:      48.5  mm     G. Age:  20w 5d         89  %    CI:        74.47   %    70 - 86                                                          FL/HC:      18.0   %    16.8 - 19.8  HC:      178.4  mm     G. Age:  20w 2d         76  %    HC/AC:      1.19        1.09 - 1.39  AC:      150.4  mm     G. Age:  20w 2d         69  %     FL/BPD:     66.2   %  FL:  32.1  mm     G. Age:  20w 0d         80  %    FL/AC:      21.3   %    20 - 24  CER:      19.7  mm     G. Age:  18w 6d         34  %  NFT:       3.2  mm  CM:        3.3  mm  Est. FW:     337  gm    0 lb 12 oz      80  % ---------------------------------------------------------------------- OB History  Gravidity:    4         Term:   0         SAB:   3  Living:       0 ---------------------------------------------------------------------- Gestational Age  LMP:           19w 4d        Date:  05/31/18                 EDD:   03/07/19  U/S Today:     20w 2d                                        EDD:   03/02/19  Best:          19w 4d     Det. By:  LMP  (05/31/18)          EDD:   03/07/19 ---------------------------------------------------------------------- Anatomy  Cranium:               Appears normal         LVOT:                   Appears normal  Cavum:                 Appears normal         Aortic Arch:            Appears normal  Ventricles:            Appears normal         Ductal Arch:            Appears normal  Choroid Plexus:        Appears normal         Diaphragm:              Appears normal  Cerebellum:            Appears normal         Stomach:                Appears normal, left                                                                        sided  Posterior Fossa:       Appears normal         Abdomen:  Appears normal  Nuchal Fold:           Appears normal         Abdominal Wall:         Appears nml (cord                                                                        insert, abd wall)  Face:                  Appears normal         Cord Vessels:           Appears normal (3                         (orbits and profile)                           vessel cord)  Lips:                  Appears normal         Kidneys:                Appear normal  Palate:                Not well visualized    Bladder:                Appears normal  Thoracic:               Appears normal         Spine:                  Appears normal  Heart:                 Echogenic focus        Upper Extremities:      Appears normal                         in LV  RVOT:                  Appears normal         Lower Extremities:      Appears normal  Other:  Technically difficult due to maternal habitus and fetal position. Fetus          appears to be a female. Open hands visualized. Heels visualized.          Nasal bone visualized. ---------------------------------------------------------------------- Cervix Uterus Adnexa  Cervix  Length:           4.07  cm.  Normal appearance by transabdominal scan.  Uterus  No abnormality visualized. ---------------------------------------------------------------------- Comments  BMI:49 %, Good growth and movement noted. Good amount  of fluid is seen. ---------------------------------------------------------------------- Impression  Normal interval growth.  No ultrasonic evidence of structural  fetal anomalies.  BMI >40  Low risk NIPS  Good fetal movemet and amniotic fluid. ---------------------------------------------------------------------- Recommendations  Follow up growth scheduled in 8 weeks. ----------------------------------------------------------------------               Sander Nephew, MD Electronically Signed Final Report  10/15/2018 11:44 am ----------------------------------------------------------------------   MAU Course/MDM: Orders Placed This Encounter  Procedures  . Urinalysis, Routine w reflex microscopic  . Discharge patient Discharge disposition: 01-Home or Self Care; Discharge patient date: 11/11/2018    Meds ordered this encounter  Medications  . cyclobenzaprine (FLEXERIL) tablet 10 mg  . polyethylene glycol (MIRALAX / GLYCOLAX) 17 g packet    Sig: Take 17 g by mouth 2 (two) times daily.    Dispense:  24 each    Refill:  3  . docusate sodium (COLACE) 100 MG capsule    Sig: Take 1 capsule (100 mg total) by mouth 2 (two)  times daily.    Dispense:  60 capsule    Refill:  2  . senna (SENOKOT) 8.6 MG TABS tablet    Sig: Take 1 tablet (8.6 mg total) by mouth daily.    Dispense:  120 tablet    Refill:  0  . cyclobenzaprine (FLEXERIL) 10 MG tablet    Sig: Take 1 tablet (10 mg total) by mouth at bedtime as needed for muscle spasms.    Dispense:  30 tablet    Refill:  2     Assessment: 1. Pain of round ligament during pregnancy   2. Abdominal pain during pregnancy in second trimester   3. Constipation, unspecified constipation type    NST reviewed: AGA. No ctx. Likely round ligament pain. Duration of symptoms makes an acute abdominal pathology highly unlikely. Vitals stable. Treatments in MAU included Flexeril which decreased pain from 8/10 to 5/10. Upon further questioning with FOB in room, patient admits to chronic constipation. She did have a BM this morning but otherwise only goes a couple times weekly. Reports she has seen GI for this and they were unsure of etiology. At its worst, patient states she will only go about once monthly. She has taken Miralax without improvement.  Pain today likely a combination of round ligament pain and chronic constipation. Meds prescribed and info given.    Pt discharge with strict return precautions.  Plan: Discharge home Labor precautions and fetal kick counts Follow-up [facility] Follow up.   Specialty: Obstetrics and Gynecology Contact information: [address] [place] [phone]         Allergies as of 11/11/2018      Reactions   Milk-related Compounds Anaphylaxis   Breaks face out *can have 2% milk*   Mushroom Extract Complex Anaphylaxis   Penicillins Anaphylaxis   Has patient had a PCN reaction causing immediate rash, facial/tongue/throat swelling, SOB or lightheadedness with hypotension: Yes Has patient had a PCN reaction causing severe rash involving mucus  membranes or skin necrosis: No Has patient had a PCN reaction that required hospitalization No Has patient had a PCN reaction occurring within the last 10 years: Yes If all of the above answers are "NO", then may proceed with Cephalosporin use.   Cheese Other (See Comments)   Breaks face out   Latex Itching, Swelling   Sulfa Antibiotics Swelling      Medication List    TAKE these medications   acetaminophen 500 MG tablet Commonly known as: TYLENOL Take 1,000 mg by mouth every 6 (six) hours as needed for headache.   albuterol 108 (90 Base) MCG/ACT inhaler Commonly known as: VENTOLIN HFA Inhale 2 puffs into the lungs every 6 (six) hours as needed for wheezing or shortness of breath.   aspirin EC 81 MG tablet  Take 1 tablet (81 mg total) by mouth daily. Take after 12 weeks for prevention of preeclampsia later in pregnancy   Blood Pressure Kit Monitor BP readings at home regularly O09.90 extra large   cyclobenzaprine 10 MG tablet Commonly known as: FLEXERIL Take 1 tablet (10 mg total) by mouth at bedtime as needed for muscle spasms.   docusate sodium 100 MG capsule Commonly known as: Colace Take 1 capsule (100 mg total) by mouth 2 (two) times daily.   Doxylamine-Pyridoxine 10-10 MG Tbec Commonly known as: Diclegis Take 2 tablets by mouth at bedtime. If symptoms persist, add one tablet in the morning and one in the afternoon   folic acid 1 MG tablet Commonly known as: FOLVITE Take 1 tablet (1 mg total) by mouth daily.   levETIRAcetam 750 MG tablet Commonly known as: KEPPRA Take 1 tablet (750 mg total) by mouth 3 (three) times daily. One po bid   pantoprazole 40 MG tablet Commonly known as: Protonix Take 1 tablet (40 mg total) by mouth daily.   polyethylene glycol 17 g packet Commonly known as: MIRALAX / GLYCOLAX Take 17 g by mouth 2 (two) times daily. What changed: when to take this   Prenate Mini 29-0.6-0.4-350 MG Caps Take 1 capsule by mouth daily before  breakfast.   promethazine 25 MG tablet Commonly known as: PHENERGAN Take 1 tablet (25 mg total) by mouth every 6 (six) hours as needed for nausea or vomiting.   senna 8.6 MG Tabs tablet Commonly known as: SENOKOT Take 1 tablet (8.6 mg total) by mouth daily.   terconazole 0.4 % vaginal cream Commonly known as: TERAZOL 7 Place 1 applicator vaginally at bedtime. Use for seven days       Barrington Ellison, MD [facility] Fellow, [facility] 11/11/2018 2:41 AM

## 2018-11-14 ENCOUNTER — Telehealth: Payer: Self-pay

## 2018-11-14 NOTE — Telephone Encounter (Signed)
  Returned call to patient advised her to go to MAU if pain is unbeable or call Office in the Morning for appt.  she opted to call at 8 am for an appt

## 2018-11-15 ENCOUNTER — Encounter (HOSPITAL_COMMUNITY): Payer: Self-pay

## 2018-11-15 ENCOUNTER — Other Ambulatory Visit: Payer: Self-pay

## 2018-11-15 ENCOUNTER — Inpatient Hospital Stay (HOSPITAL_COMMUNITY)
Admission: AD | Admit: 2018-11-15 | Discharge: 2018-11-15 | Disposition: A | Discharge status: Home / Self Care | Payer: Medicaid Other | Attending: Obstetrics & Gynecology | Admitting: Obstetrics & Gynecology

## 2018-11-15 DIAGNOSIS — O0990 Supervision of high risk pregnancy, unspecified, unspecified trimester: Secondary | ICD-10-CM

## 2018-11-15 DIAGNOSIS — O26892 Other specified pregnancy related conditions, second trimester: Secondary | ICD-10-CM | POA: Diagnosis not present

## 2018-11-15 DIAGNOSIS — Z87898 Personal history of other specified conditions: Secondary | ICD-10-CM

## 2018-11-15 DIAGNOSIS — Z3A24 24 weeks gestation of pregnancy: Secondary | ICD-10-CM

## 2018-11-15 DIAGNOSIS — M7918 Myalgia, other site: Secondary | ICD-10-CM | POA: Insufficient documentation

## 2018-11-15 DIAGNOSIS — R109 Unspecified abdominal pain: Secondary | ICD-10-CM | POA: Diagnosis present

## 2018-11-15 DIAGNOSIS — O99212 Obesity complicating pregnancy, second trimester: Secondary | ICD-10-CM

## 2018-11-15 DIAGNOSIS — O0992 Supervision of high risk pregnancy, unspecified, second trimester: Secondary | ICD-10-CM

## 2018-11-15 DIAGNOSIS — O99210 Obesity complicating pregnancy, unspecified trimester: Secondary | ICD-10-CM

## 2018-11-15 LAB — COMPREHENSIVE METABOLIC PANEL
ALT: 25 U/L (ref 0–44)
AST: 21 U/L (ref 15–41)
Albumin: 3 g/dL — ABNORMAL LOW (ref 3.5–5.0)
Alkaline Phosphatase: 79 U/L (ref 38–126)
Anion gap: 9 (ref 5–15)
BUN: 5 mg/dL — ABNORMAL LOW (ref 6–20)
CO2: 22 mmol/L (ref 22–32)
Calcium: 9 mg/dL (ref 8.9–10.3)
Chloride: 106 mmol/L (ref 98–111)
Creatinine, Ser: 0.53 mg/dL (ref 0.44–1.00)
GFR calc Af Amer: >60 mL/min (ref >60)
GFR calc non Af Amer: >60 mL/min (ref >60)
Glucose, Bld: 90 mg/dL (ref 70–99)
Potassium: 3.8 mmol/L (ref 3.5–5.1)
Sodium: 137 mmol/L (ref 135–145)
Total Bilirubin: 0.3 mg/dL (ref 0.3–1.2)
Total Protein: 6.2 g/dL — ABNORMAL LOW (ref 6.5–8.1)

## 2018-11-15 LAB — URINALYSIS, ROUTINE W REFLEX MICROSCOPIC
Bilirubin Urine: NEGATIVE
Glucose, UA: NEGATIVE mg/dL
Hgb urine dipstick: NEGATIVE
Ketones, ur: NEGATIVE mg/dL
Leukocytes,Ua: NEGATIVE
Nitrite: NEGATIVE
Protein, ur: NEGATIVE mg/dL
Specific Gravity, Urine: 1.024 (ref 1.005–1.030)
pH: 6 (ref 5.0–8.0)

## 2018-11-15 LAB — CBC
HCT: 33 % — ABNORMAL LOW (ref 36.0–46.0)
Hemoglobin: 10.9 g/dL — ABNORMAL LOW (ref 12.0–15.0)
MCH: 29.6 pg (ref 26.0–34.0)
MCHC: 33 g/dL (ref 30.0–36.0)
MCV: 89.7 fL (ref 80.0–100.0)
Platelets: 300 10*3/uL (ref 150–400)
RBC: 3.68 MIL/uL — ABNORMAL LOW (ref 3.87–5.11)
RDW: 13.6 % (ref 11.5–15.5)
WBC: 10.1 10*3/uL (ref 4.0–10.5)
nRBC: 0 % (ref 0.0–0.2)

## 2018-11-15 LAB — LIPASE, BLOOD: Lipase: 19 U/L (ref 11–51)

## 2018-11-15 MED ORDER — COMFORT FIT MATERNITY SUPP LG MISC: 1.0000 "application " | Freq: Every day | 0 refills | Status: DC

## 2018-11-15 MED ORDER — ACETAMINOPHEN 500 MG PO TABS
1000.0000 mg | ORAL_TABLET | Freq: Four times a day (QID) | ORAL | Status: DC | PRN
  Administered 2018-11-15: 1000 mg via ORAL
  Filled 2018-11-15: qty 2

## 2018-11-15 MED ORDER — CYCLOBENZAPRINE HCL 10 MG PO TABS
10.0000 mg | ORAL_TABLET | Freq: Three times a day (TID) | ORAL | Status: DC | PRN
  Administered 2018-11-15: 10 mg via ORAL
  Filled 2018-11-15: qty 1

## 2018-11-15 MED ORDER — CYCLOBENZAPRINE HCL 10 MG PO TABS: 10.0000 mg | ORAL_TABLET | Freq: Three times a day (TID) | ORAL | 0 refills | Status: DC | PRN

## 2018-11-15 NOTE — [facility] Provider Note (Addendum)
History     CSN: [id number]  Arrival date and time: 11/15/18 1137   First Provider Initiated Contact with Patient 11/15/18 1240      Chief Complaint  Patient presents with  . Abdominal Pain   HPI  The patient says that she has had intolerable right side pain for several days.   She presented to [facility] for this on 11/11/2018; see notes from that encounter for reference.  Today, pain is similar to 11/2 but worse. Worsened acutely last night. Could not sleep well in any position. Lying on right side is the worst. Pain gets worse when she is sitting up from lying supine and 9/10 when she sits up straight. She says it feels sharp with movement. She has had pain like this throughout her pregnancy, sometimes left side, sometimes right, sometimes both, and it has worsened progressively. Pain is not associated with eating. Can come on any time but worse with activity. So severe yesterday that she had to lie on the floor of her home office for relief.   No contractions, vaginal bleeding, leakage of fluid. Feeling her baby move regularly.   No recent fever, chills, cough or respiratory symptoms, nausea, vomiting, diarrhea, dysuria, hematuria. She reports chronic constipation. At worst, 1 bowel movement monthly. Reports she had a "really good" BM yesterday morning. At home still taking senna, colace, and miralax prescribed at [facility]U on 11/2.   She says her home office chair broke about a month ago. She was falling forward and would have hit her desk with her belly but she turned to the right so that she hit with her side instead to protect her fetus. She said this was very painful and she was sore with dull achy pain that has gradually resolved over the last month. That pain felt different from the pain she's having today, she says.   OB History    Gravida  4   Para      Term      Preterm      AB  3   Living        SAB  3   TAB      Ectopic      Multiple      Live Births               Past Medical History:  Diagnosis Date  . ADHD (attention deficit hyperactivity disorder)   . Anemia   . Anxiety   . Asthma   . Chronic migraine 08/01/2018  . Depression   . Gonorrhea in female 10/05/2013  . 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  . Headache   . Obesity   . Seizures (HCC)    "when I get too hot"  . Trichomonas infection   . UTI (lower urinary tract infection)     Past Surgical History:  Procedure Laterality Date  . ADENOIDECTOMY    . DILATION AND EVACUATION N/A 08/09/2013   Procedure: DILATATION AND EVACUATION;  Surgeon: Bernard A Marshall, MD;  Location: [facility];  Service: Gynecology;  Laterality: N/A;  . ORTHOPEDIC SURGERY Left    left hand  . TONSILLECTOMY      Family History  Problem Relation Age of Onset  . Seizures Mother   . Stroke Mother   . Hypertension Father   . Diabetes Maternal Grandmother   . Colon cancer Maternal Grandmother   . Hypertension Maternal Grandmother   . Clotting disorder Maternal Grandmother   . Breast cancer Maternal Grandmother   .   Stroke Maternal Grandmother   . Diabetes Paternal Grandmother   . Seizures Maternal Aunt     Social History   Tobacco Use  . Smoking status: Never Smoker  . Smokeless tobacco: Never Used  Substance Use Topics  . Alcohol use: No    Alcohol/week: 0.0 standard drinks  . Drug use: No    Allergies:  Allergies  Allergen Reactions  . Milk-Related Compounds Anaphylaxis    Breaks face out *can have 2% milk*  . Mushroom Extract Complex Anaphylaxis  . Penicillins Anaphylaxis    Has patient had a PCN reaction causing immediate rash, facial/tongue/throat swelling, SOB or lightheadedness with hypotension: Yes Has patient had a PCN reaction causing severe rash involving mucus membranes or skin necrosis: No Has patient had a PCN reaction that required hospitalization No Has patient had a PCN reaction occurring within the last 10 years: Yes If all of the above answers are "NO", then may  proceed with Cephalosporin use.   [name] Other (See Comments)    Breaks face out  . Latex Itching and Swelling  . Sulfa Antibiotics Swelling  . Vicodin [Hydrocodone-Acetaminophen] Hives    Medications Prior to Admission  Medication Sig Dispense Refill Last Dose  . albuterol (PROVENTIL HFA;VENTOLIN HFA) 108 (90 Base) MCG/ACT inhaler Inhale 2 puffs into the lungs every 6 (six) hours as needed for wheezing or shortness of breath. 1 Inhaler 0 Past Month at Unknown time  . aspirin EC 81 MG tablet Take 1 tablet (81 mg total) by mouth daily. Take after 12 weeks for prevention of preeclampsia later in pregnancy 300 tablet 2 11/15/2018 at Unknown time  . docusate sodium (COLACE) 100 MG capsule Take 1 capsule (100 mg total) by mouth 2 (two) times daily. 60 capsule 2 11/15/2018 at Unknown time  . Doxylamine-Pyridoxine (DICLEGIS) 10-10 MG TBEC Take 2 tablets by mouth at bedtime. If symptoms persist, add one tablet in the morning and one in the afternoon 100 tablet 5 11/14/2018 at Unknown time  . folic acid (FOLVITE) 1 MG tablet Take 1 tablet (1 mg total) by mouth daily. 30 tablet 10 11/15/2018 at Unknown time  . levETIRAcetam (KEPPRA) 750 MG tablet Take 1 tablet (750 mg total) by mouth 3 (three) times daily. One po bid 90 tablet 3 11/15/2018 at Unknown time  . pantoprazole (PROTONIX) 40 MG tablet Take 1 tablet (40 mg total) by mouth daily. 30 tablet 1 Past Week at Unknown time  . polyethylene glycol (MIRALAX / GLYCOLAX) 17 g packet Take 17 g by mouth 2 (two) times daily. 24 each 3 11/14/2018 at Unknown time  . Prenat w/o A-FeCbn-Meth-FA-DHA (PRENATE MINI) 29-0.6-0.4-350 MG CAPS Take 1 capsule by mouth daily before breakfast. 30 capsule 11 11/15/2018 at Unknown time  . promethazine (PHENERGAN) 25 MG tablet Take 1 tablet (25 mg total) by mouth every 6 (six) hours as needed for nausea or vomiting. 30 tablet 1 Past Month at Unknown time  . senna (SENOKOT) 8.6 MG TABS tablet Take 1 tablet (8.6 mg total) by mouth  daily. 120 tablet 0 11/14/2018 at Unknown time  . [DISCONTINUED] cyclobenzaprine (FLEXERIL) 10 MG tablet Take 1 tablet (10 mg total) by mouth at bedtime as needed for muscle spasms. 30 tablet 2 11/14/2018 at Unknown time  . acetaminophen (TYLENOL) 500 MG tablet Take 1,000 mg by mouth every 6 (six) hours as needed for headache.     . Blood Pressure KIT Monitor BP readings at home regularly O09.90 extra large 1 kit 0   .  terconazole (TERAZOL 7) 0.4 % vaginal cream Place 1 applicator vaginally at bedtime. Use for seven days 45 g 0     Review of Systems  Constitutional: Negative for appetite change, chills, fatigue and fever.  HENT: Negative.   Respiratory: Negative.   Cardiovascular: Negative.   Gastrointestinal: Negative for abdominal pain, anal bleeding, blood in stool, diarrhea, nausea and vomiting.  Genitourinary: Positive for flank pain. Negative for decreased urine volume, difficulty urinating, dysuria, frequency, hematuria, pelvic pain, urgency, vaginal bleeding and vaginal discharge.  Musculoskeletal:       Right side pain and tenderness  Neurological: Negative.    Physical Exam   Blood pressure 127/79, pulse 97, temperature 98.2 F (36.8 C), temperature source Oral, resp. rate 19, height 5' 6" (1.676 m), weight (!) 139.7 kg, last menstrual period 05/31/2018, SpO2 100 %.  Physical Exam  Constitutional: She is oriented to person, place, and time. No distress.  Obese woman resting in bed, nad  HENT:  Head: Normocephalic and atraumatic.  Eyes: Conjunctivae are normal. No scleral icterus.  Cardiovascular: Intact distal pulses.  Respiratory: No respiratory distress. She exhibits no tenderness.  GI: Soft. Bowel sounds are normal. She exhibits no mass. There is no abdominal tenderness. There is no rebound, no guarding and no CVA tenderness.  Obese abdomen  Genitourinary:    Vagina and uterus normal.     Genitourinary Comments: VE: closed/long   Musculoskeletal:        General:  Tenderness (Some reproducible pain with palpation on right side of torso) present. No deformity.     Cervical back: Normal.     Thoracic back: Normal.     Lumbar back: She exhibits tenderness (R>L).     Comments: Negative L SLR. Sharp right side pain worsens with R SLR and palpation.   Neurological: She is alert and oriented to person, place, and time.  Skin: Skin is warm and dry. No rash noted.  No ecchymoses or signs of trauma on right side  Psychiatric: She has a normal mood and affect.   Results for orders placed or performed during the hospital encounter of 11/15/18 (from the past 24 hour(s))  Urinalysis, Routine w reflex microscopic     Status: Abnormal   Collection Time: 11/15/18 12:33 PM  Result Value Ref Range   Color, Urine YELLOW YELLOW   APPearance CLOUDY (A) CLEAR   Specific Gravity, Urine 1.024 1.005 - 1.030   pH 6.0 5.0 - 8.0   Glucose, UA NEGATIVE NEGATIVE mg/dL   Hgb urine dipstick NEGATIVE NEGATIVE   Bilirubin Urine NEGATIVE NEGATIVE   Ketones, ur NEGATIVE NEGATIVE mg/dL   Protein, ur NEGATIVE NEGATIVE mg/dL   Nitrite NEGATIVE NEGATIVE   Leukocytes,Ua NEGATIVE NEGATIVE  CBC     Status: Abnormal   Collection Time: 11/15/18  1:51 PM  Result Value Ref Range   WBC 10.1 4.0 - 10.5 K/uL   RBC 3.68 (L) 3.87 - 5.11 MIL/uL   Hemoglobin 10.9 (L) 12.0 - 15.0 g/dL   HCT 33.0 (L) 36.0 - 46.0 %   MCV 89.7 80.0 - 100.0 fL   MCH 29.6 26.0 - 34.0 pg   MCHC 33.0 30.0 - 36.0 g/dL   RDW 13.6 11.5 - 15.5 %   Platelets 300 150 - 400 K/uL   nRBC 0.0 0.0 - 0.2 %  Comprehensive metabolic panel     Status: Abnormal   Collection Time: 11/15/18  1:51 PM  Result Value Ref Range   Sodium 137 135 - 145 mmol/L     Potassium 3.8 3.5 - 5.1 mmol/L   Chloride 106 98 - 111 mmol/L   CO2 22 22 - 32 mmol/L   Glucose, Bld 90 70 - 99 mg/dL   BUN 5 (L) 6 - 20 mg/dL   Creatinine, Ser 0.53 0.44 - 1.00 mg/dL   Calcium 9.0 8.9 - 10.3 mg/dL   Total Protein 6.2 (L) 6.5 - 8.1 g/dL   Albumin 3.0 (L)  3.5 - 5.0 g/dL   AST 21 15 - 41 U/L   ALT 25 0 - 44 U/L   Alkaline Phosphatase 79 38 - 126 U/L   Total Bilirubin 0.3 0.3 - 1.2 mg/dL   GFR calc non Af Amer >60 >60 mL/min   GFR calc Af Amer >60 >60 mL/min   Anion gap 9 5 - 15  Lipase, blood     Status: None   Collection Time: 11/15/18  1:51 PM  Result Value Ref Range   Lipase 19 11 - 51 U/L   MAU Course  Procedures  MDM Pt appears to be resting in bed, not in distress Initially tachycardic to 112, otherwise VSS Physical exam largely benign without signs of trauma and with some reproducible, sharp pain and tenderness to palpation over right side of torso  CBC, CMP, lipase, UA  Labs largely unremarkable - no signs of acute process CBC with mild normocytic anemia w Hgb 10.9, Hct 33, MCV 89.7 UA cloudy w SG 1.024 but not concerning for infection CMP w Alb 3.0, TP 6.2  Lipase wnl  Her presentation, vitals, exam, and studies are not consistent with other items initially on ddx, including trauma (e.g. rib fracture), ACS, GERD, pancreatitis, biliary process, kidney stones, pyelonephritis, LUTI.   1404: Flexeril 10 mg and APAP 1g given for pain  1500: pt reports relief from those. Pain most consistent w MSK etiology  Assessment and Plan  Pt is 23 yo [id number] at 46w0dwith right side pain that is musculoskeletal in nature, likely a combination of round ligament pain and strain due to generous body habitus. This presentation is similar to prior (see 11/11/2018 notes).   - prescribing maternity belt to provide more support - Rx for flexeril - return precautions given - [facility]  MJulianne Handler11/06/2018, 3:26 PM   Allergies as of 11/15/2018      Reactions   Milk-related Compounds Anaphylaxis   Breaks face out *can have 2% milk*   Mushroom Extract Complex Anaphylaxis   Penicillins Anaphylaxis   Has patient had a PCN reaction causing immediate rash, facial/tongue/throat swelling, SOB or lightheadedness  with hypotension: Yes Has patient had a PCN reaction causing severe rash involving mucus membranes or skin necrosis: No Has patient had a PCN reaction that required hospitalization No Has patient had a PCN reaction occurring within the last 10 years: Yes If all of the above answers are "NO", then may proceed with Cephalosporin use.   Cheese Other (See Comments)   Breaks face out   Latex Itching, Swelling   Sulfa Antibiotics Swelling   Vicodin [hydrocodone-acetaminophen] Hives      Medication List    TAKE these medications   acetaminophen 500 MG tablet Commonly known as: TYLENOL Take 1,000 mg by mouth every 6 (six) hours as needed for headache.   albuterol 108 (90 Base) MCG/ACT inhaler Commonly known as: VENTOLIN HFA Inhale 2 puffs into the lungs every 6 (six) hours as needed for wheezing or shortness of breath.   aspirin EC 81  MG tablet Take 1 tablet (81 mg total) by mouth daily. Take after 12 weeks for prevention of preeclampsia later in pregnancy   Blood Pressure Kit Monitor BP readings at home regularly O09.90 extra large   Comfort Fit Maternity Supp Lg Misc 1 application by Does not apply route daily.   cyclobenzaprine 10 MG tablet Commonly known as: FLEXERIL Take 1 tablet (10 mg total) by mouth 3 (three) times daily as needed for muscle spasms (back pain). What changed:   when to take this  reasons to take this   docusate sodium 100 MG capsule Commonly known as: Colace Take 1 capsule (100 mg total) by mouth 2 (two) times daily.   Doxylamine-Pyridoxine 10-10 MG Tbec Commonly known as: Diclegis Take 2 tablets by mouth at bedtime. If symptoms persist, add one tablet in the morning and one in the afternoon   folic acid 1 MG tablet Commonly known as: FOLVITE Take 1 tablet (1 mg total) by mouth daily.   levETIRAcetam 750 MG tablet Commonly known as: KEPPRA Take 1 tablet (750 mg total) by mouth 3 (three) times daily. One po bid   pantoprazole 40 MG  tablet Commonly known as: Protonix Take 1 tablet (40 mg total) by mouth daily.   polyethylene glycol 17 g packet Commonly known as: MIRALAX / GLYCOLAX Take 17 g by mouth 2 (two) times daily.   Prenate Mini 29-0.6-0.4-350 MG Caps Take 1 capsule by mouth daily before breakfast.   promethazine 25 MG tablet Commonly known as: PHENERGAN Take 1 tablet (25 mg total) by mouth every 6 (six) hours as needed for nausea or vomiting.   senna 8.6 MG Tabs tablet Commonly known as: SENOKOT Take 1 tablet (8.6 mg total) by mouth daily.   terconazole 0.4 % vaginal cream Commonly known as: TERAZOL 7 Place 1 applicator vaginally at bedtime. Use for seven days      I confirm that I have verified the information documented in the medical student's note and that I have also personally reperformed the history, physical exam and all medical decision making activities of this service and have verified that all service and findings are accurately documented in this student's note.   MDM: Pain likely MSK and responded well to Flexeril. No signs of UTI, pyelo, or PTL. Rx maternity belt and Flexeril. Stable for discharge home.   , , CNM 11/15/2018 3:26 PM     

## 2018-11-15 NOTE — Discharge Instructions (Signed)
Back Pain in Pregnancy Back pain during pregnancy is common. Back pain may be caused by several factors that are related to changes during your pregnancy. Follow these instructions at home: Managing pain, stiffness, and swelling      If directed, for sudden (acute) back pain, put ice on the painful area. ? Put ice in a plastic bag. ? Place a towel between your skin and the bag. ? Leave the ice on for 20 minutes, 2-3 times per day.  If directed, apply heat to the affected area before you exercise. Use the heat source that your health care provider recommends, such as a moist heat pack or a heating pad. ? Place a towel between your skin and the heat source. ? Leave the heat on for 20-30 minutes. ? Remove the heat if your skin turns bright red. This is especially important if you are unable to feel pain, heat, or cold. You may have a greater risk of getting burned.  If directed, massage the affected area. Activity  Exercise as told by your health care provider. Gentle exercise is the best way to prevent or manage back pain.  Listen to your body when lifting. If lifting hurts, ask for help or bend your knees. This uses your leg muscles instead of your back muscles.  Squat down when picking up something from the floor. Do not bend over.  Only use bed rest for short periods as told by your health care provider. Bed rest should only be used for the most severe episodes of back pain. Standing, sitting, and lying down  Do not stand in one place for long periods of time.  Use good posture when sitting. Make sure your head rests over your shoulders and is not hanging forward. Use a pillow on your lower back if necessary.  Try sleeping on your side, preferably the left side, with a pregnancy support pillow or 1-2 regular pillows between your legs. ? If you have back pain after a night's rest, your bed may be too soft. ? A firm mattress may provide more support for your back during  pregnancy. General instructions  Do not wear high heels.  Eat a healthy diet. Try to gain weight within your health care provider's recommendations.  Use a maternity girdle, elastic sling, or back brace as told by your health care provider.  Take over-the-counter and prescription medicines only as told by your health care provider.  Work with a physical therapist or massage therapist to find ways to manage back pain. Acupuncture or massage therapy may be helpful.  Keep all follow-up visits as told by your health care provider. This is important. Contact a health care provider if:  Your back pain interferes with your daily activities.  You have increasing pain in other parts of your body. Get help right away if:  You develop numbness, tingling, weakness, or problems with the use of your arms or legs.  You develop severe back pain that is not controlled with medicine.  You have a change in bowel or bladder control.  You develop shortness of breath, dizziness, or you faint.  You develop nausea, vomiting, or sweating.  You have back pain that is a rhythmic, cramping pain similar to labor pains. Labor pain is usually 1-2 minutes apart, lasts for about 1 minute, and involves a bearing down feeling or pressure in your pelvis.  You have back pain and your water breaks or you have vaginal bleeding.  You have back pain or numbness  that travels down your leg.  Your back pain developed after you fell.  You develop pain on one side of your back.  You see blood in your urine.  You develop skin blisters in the area of your back pain. Summary  Back pain may be caused by several factors that are related to changes during your pregnancy.  Follow instructions as told by your health care provider for managing pain, stiffness, and swelling.  Exercise as told by your health care provider. Gentle exercise is the best way to prevent or manage back pain.  Take over-the-counter and  prescription medicines only as told by your health care provider.  Keep all follow-up visits as told by your health care provider. This is important. This information is not intended to replace advice given to you by your health care provider. Make sure you discuss any questions you have with your health care provider. Document Released: 04/05/2005 Document Revised: 04/16/2018 Document Reviewed: 06/13/2017 Elsevier Patient Education  2020 [place]: You are not alone, Seventy-five percent of women have some sort of abdominal or back pain at some point in their pregnancy. Your baby is growing at a fast pace, which means that your whole body is rapidly trying to adjust to the changes. As your uterus grows, your back may start feeling a bit under stress and this can result in back or abdominal pain that can go from mild, and therefore bearable, to severe pains that will not allow you to sit or lay down comfortably, When it comes to dealing with pregnancy-related pains and cramps, some pregnant women usually prefer natural remedies, which the market is filled with nowadays. For example, wearing a pregnancy support belt can help ease and lessen your discomfort and pain. WHAT ARE THE BENEFITS OF WEARING A PREGNANCY SUPPORT BELT? A pregnancy support belt provides support to the lower portion of the belly taking some of the weight of the growing uterus and distributing to the other parts of your body. It is designed make you comfortable and gives you extra support. Over the years, the pregnancy apparel market has been studying the needs and wants of pregnant women and they have come up with the most comfortable pregnancy support belts that woman could ever ask for. In fact, you will no longer have to wear a stretched-out or bulky pregnancy belt that is visible underneath your clothes and makes you feel even more uncomfortable. Nowadays, a pregnancy support belt is made of  comfortable and stretchy materials that will not irritate your skin but will actually make you feel at ease and you will not even notice you are wearing it. They are easy to put on and adjust during the day and can be worn at night for additional support.  BENEFITS:  Relives Back pain  Relieves Abdominal Muscle and Leg Pain  Stabilizes the Pelvic Ring  Offers a Cushioned Abdominal Lift Pad  Relieves pressure on the Sciatic Nerve Within Minutes WHERE TO GET YOUR PREGNANCY BELT: [employer] [phone] @[phone]  [place], [place] [address]

## 2018-11-15 NOTE — MAU Note (Signed)
.   Latoya Stark is a 23 y.o. at [redacted]w[redacted]d here in MAU reporting: sharp right sided abdominal pain that is exacerbated by sitting straight up that started two days ago. It has gotten worse since her last visit here in MAU.  Pain score: 9 when sitting up Vitals:   11/15/18 1211  BP: 124/71  Pulse: (!) 112  Resp: 20  Temp: 98.7 F (37.1 C)     FHT:152 Lab orders placed from triage:

## 2018-11-17 DIAGNOSIS — Z3A24 24 weeks gestation of pregnancy: Secondary | ICD-10-CM | POA: Insufficient documentation

## 2018-11-17 NOTE — Progress Notes (Signed)
PATIENT: Latoya Stark DOB: [birth year]  REASON FOR VISIT: follow up HISTORY FROM: patient  Chief Complaint  Patient presents with  . Follow-up    4 mon f/u. Fiance' present. Rm 1. Patient's fiance mentioned that she had a seizure on November 2nd. He stated that she was sitting on the toilet when it happened. He stated that she didnt fall off the toilet or hit her head.      HISTORY OF PRESENT ILLNESS: Today 11/18/18 Latoya Stark is a 23 y.o. female here today for follow up for seizures. She is currently [redacted] weeks pregnant. She continues levetiracetam 736m TID.  She reports that she has had 2 seizures since last being seen in July.  Her fianc is here with her today and reports that her last seizure was November 2.  She was sitting on the toilet and noted feeling hot and dizzy.  He reports that she had convulsions for approximately 2 minutes.  He states that she did urinate in the toilet while having convulsions.  He feels that the past 2 seizures have been much shorter and less severe than her previous seizures.  She continues to have migraines but feels that these have improved significantly.  She is tolerating levetiracetam well with no obvious adverse effects.  She is followed closely by OB.  Last appointment was October 23.  She is scheduled for follow-up again on November 23.  HISTORY: (copied from Dr SGarth Bignessnote on 08/01/2018)  I had the pleasure of seeing your patient, Latoya Stark at [facility] for neurologic consultation regarding her headaches and seizures.  She is a 23year old woman who has a long history of headaches and seizures since age 23  She is currently 8 to [redacted] weeks pregnant.  She has had migraine headaches for yearsbut the frequency has increased over the past few months.  Headaches are daily and located bilaterally, forehead > temporal region.    She gets nausea, severe photophobia and phonophobia.    Headaches usually improve if she  sleeps them off.    Heat triggers the headache.  Tylenol sometimes stops the headaches.   Topamax had helped her headaches with benefit.      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  Her last one was early July 2020.  Her previous one had been a month or two earlier (while on Dilantin).   She has been having one every 2-3 months, even while on treatment.  She stopped Dilantin 4 weeks ago (10 days or so before the last seizure) due to pregnancy.    She is currently 8-[redacted] weeks pregnant.       She has not tried other medications besides Dilantin.    She has seen Dr. HGaynell Facein the past.   REVIEW OF SYSTEMS: Out of a complete 14 system review of symptoms, the patient complains only of the following symptoms, dizziness, headaches, seizure, weakness, passing out and all other reviewed systems are negative.  ALLERGIES: Allergies  Allergen Reactions  . Milk-Related Compounds Anaphylaxis    Breaks face out *can have 2% milk*  . Mushroom Extract Complex Anaphylaxis  . Penicillins Anaphylaxis    Has patient had a PCN reaction causing immediate rash, facial/tongue/throat swelling, SOB or lightheadedness with hypotension: Yes Has patient had a PCN reaction causing severe rash involving mucus membranes or skin necrosis: No Has patient  had a PCN reaction that required hospitalization No Has patient had a PCN reaction occurring within the last 10 years: Yes If all of the above answers are "NO", then may proceed with Cephalosporin use.   [name] Other (See Comments)    Breaks face out  . Latex Itching and Swelling  . Sulfa Antibiotics Swelling  . Vicodin [Hydrocodone-Acetaminophen] Hives    HOME MEDICATIONS: Outpatient Medications Prior to Visit  Medication Sig Dispense Refill  . acetaminophen (TYLENOL) 500 MG tablet Take 1,000 mg by mouth every 6 (six) hours as needed for headache.    . albuterol  (PROVENTIL HFA;VENTOLIN HFA) 108 (90 Base) MCG/ACT inhaler Inhale 2 puffs into the lungs every 6 (six) hours as needed for wheezing or shortness of breath. 1 Inhaler 0  . aspirin EC 81 MG tablet Take 1 tablet (81 mg total) by mouth daily. Take after 12 weeks for prevention of preeclampsia later in pregnancy 300 tablet 2  . Blood Pressure KIT Monitor BP readings at home regularly O09.90 extra large 1 kit 0  . cyclobenzaprine (FLEXERIL) 10 MG tablet Take 1 tablet (10 mg total) by mouth 3 (three) times daily as needed for muscle spasms (back pain). 30 tablet 0  . docusate sodium (COLACE) 100 MG capsule Take 1 capsule (100 mg total) by mouth 2 (two) times daily. 60 capsule 2  . Doxylamine-Pyridoxine (DICLEGIS) 10-10 MG TBEC Take 2 tablets by mouth at bedtime. If symptoms persist, add one tablet in the morning and one in the afternoon 100 tablet 5  . Elastic Bandages & Supports (COMFORT FIT MATERNITY SUPP LG) MISC 1 application by Does not apply route daily. 1 each 0  . folic acid (FOLVITE) 1 MG tablet Take 1 tablet (1 mg total) by mouth daily. 30 tablet 10  . levETIRAcetam (KEPPRA) 750 MG tablet Take 1 tablet (750 mg total) by mouth 3 (three) times daily. One po bid 90 tablet 3  . pantoprazole (PROTONIX) 40 MG tablet Take 1 tablet (40 mg total) by mouth daily. 30 tablet 1  . polyethylene glycol (MIRALAX / GLYCOLAX) 17 g packet Take 17 g by mouth 2 (two) times daily. 24 each 3  . Prenat w/o A-FeCbn-Meth-FA-DHA (PRENATE MINI) 29-0.6-0.4-350 MG CAPS Take 1 capsule by mouth daily before breakfast. 30 capsule 11  . promethazine (PHENERGAN) 25 MG tablet Take 1 tablet (25 mg total) by mouth every 6 (six) hours as needed for nausea or vomiting. 30 tablet 1  . senna (SENOKOT) 8.6 MG TABS tablet Take 1 tablet (8.6 mg total) by mouth daily. 120 tablet 0  . terconazole (TERAZOL 7) 0.4 % vaginal cream Place 1 applicator vaginally at bedtime. Use for seven days 45 g 0   No facility-administered medications prior to  visit.     PAST MEDICAL HISTORY: Past Medical History:  Diagnosis Date  . ADHD (attention deficit hyperactivity disorder)   . Anemia   . Anxiety   . Asthma   . Chronic migraine 08/01/2018  . Depression   . Gonorrhea in female 10/05/2013  . 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 Headache   . Obesity   . Seizures ([place])    "when I get too hot"  . Trichomonas infection   . UTI (lower urinary tract infection)     PAST SURGICAL HISTORY: Past Surgical History:  Procedure Laterality Date  . ADENOIDECTOMY    . DILATION AND EVACUATION N/A 08/09/2013   Procedure: DILATATION AND EVACUATION;  Surgeon: Frederico Hamman, MD;  Location: [facility];  Service: Gynecology;  Laterality: N/A;  . ORTHOPEDIC SURGERY Left    left hand  . TONSILLECTOMY      FAMILY HISTORY: Family History  Problem Relation Age of Onset  . Seizures Mother   . Stroke Mother   . Hypertension Father   . Diabetes Maternal Grandmother   . Colon cancer Maternal Grandmother   . Hypertension Maternal Grandmother   . Clotting disorder Maternal Grandmother   . Breast cancer Maternal Grandmother   . Stroke Maternal Grandmother   . Diabetes Paternal Grandmother   . Seizures Maternal Aunt     SOCIAL HISTORY: Social History   Socioeconomic History  . Marital status: Single    Spouse name: Not on file  . Number of children: 0  . Years of education: Not on file  . Highest education level: Not on file  Occupational History  . Not on file  Social Needs  . Financial resource strain: Not hard at all  . Food insecurity    Worry: Sometimes true    Inability: Sometimes true  . Transportation needs    Medical: No    Non-medical: No  Tobacco Use  . Smoking status: Never Smoker  . Smokeless tobacco: Never Used  Substance and Sexual Activity  . Alcohol use: No    Alcohol/week: 0.0 standard drinks  . Drug use: No  . Sexual activity: Not Currently    Partners: Male    Birth control/protection: None    Comment:  one partner  Lifestyle  . Physical activity    Days per week: 0 days    Minutes per session: Not on file  . Stress: Not at all  Relationships  . Social connections    Talks on phone: More than three times a week    Gets together: More than three times a week    Attends religious service: Never    Active member of club or organization: No    Attends meetings of clubs or organizations: Never    Relationship status: Never married  . Intimate partner violence    Fear of current or ex partner: No    Emotionally abused: No    Physically abused: No    Forced sexual activity: No  Other Topics Concern  . Not on file  Social History Narrative   Caffeine: tea sometimes   Lives with fiance   Right handed       PHYSICAL EXAM  Vitals:   11/18/18 0751  BP: 128/82  Pulse: (!) 107  Temp: 97.6 F (36.4 C)  TempSrc: Oral  Weight: (!) 309 lb 6.4 oz (140.3 kg)  Height: '5\' 6"'  (1.676 m)   Body mass index is 49.94 kg/m.  Generalized: Well developed, in no acute distress  Cardiology: normal rate and rhythm, no murmur noted Neurological examination  Mentation: Alert oriented to time, place, history taking. Follows all commands speech and language fluent Cranial nerve II-XII: Pupils were equal round reactive to light. Extraocular movements were full, visual field were full on confrontational test. Facial sensation and strength were normal. Uvula tongue midline. Head turning and shoulder shrug  were normal and symmetric. Motor: The motor testing reveals 5 over 5 strength of all 4 extremities. Good symmetric motor tone is noted throughout.  Sensory: Sensory testing is intact to soft touch on all 4 extremities. No evidence of extinction is noted.  Coordination: Cerebellar testing reveals good finger-nose-finger and heel-to-shin bilaterally.  Gait and station: Gait is normal.  DIAGNOSTIC DATA (  LABS, IMAGING, TESTING) - I reviewed patient records, labs, notes, testing and imaging myself where  available.  No flowsheet data found.   Lab Results  Component Value Date   WBC 10.1 11/15/2018   HGB 10.9 (L) 11/15/2018   HCT 33.0 (L) 11/15/2018   MCV 89.7 11/15/2018   PLT 300 11/15/2018      Component Value Date/Time   NA 137 11/15/2018 1351   NA 144 08/04/2015 1346   K 3.8 11/15/2018 1351   CL 106 11/15/2018 1351   CO2 22 11/15/2018 1351   GLUCOSE 90 11/15/2018 1351   BUN 5 (L) 11/15/2018 1351   BUN 8 08/04/2015 1346   CREATININE 0.53 11/15/2018 1351   CALCIUM 9.0 11/15/2018 1351   PROT 6.2 (L) 11/15/2018 1351   PROT 7.1 08/04/2015 1346   ALBUMIN 3.0 (L) 11/15/2018 1351   ALBUMIN 4.3 08/04/2015 1346   AST 21 11/15/2018 1351   ALT 25 11/15/2018 1351   ALKPHOS 79 11/15/2018 1351   BILITOT 0.3 11/15/2018 1351   BILITOT 0.3 08/04/2015 1346   GFRNONAA >60 11/15/2018 1351   GFRAA >60 11/15/2018 1351   Lab Results  Component Value Date   CHOL  12/13/2006    122        ATP III CLASSIFICATION:  <200     mg/dL   Desirable  200-239  mg/dL   Borderline High  >=240    mg/dL   High   HDL 30 (L) 12/13/2006   LDLCALC  12/13/2006    76        Total Cholesterol/HDL:CHD Risk Coronary Heart Disease Risk Table                     Men   Women  1/2 Average Risk   3.4   3.3   TRIG 78 12/13/2006   CHOLHDL 4.1 12/13/2006   Lab Results  Component Value Date   HGBA1C 5.6 07/30/2018   Lab Results  Component Value Date   VITAMINB12 309 02/24/2014   Lab Results  Component Value Date   TSH 1.770 03/15/2018       ASSESSMENT AND PLAN 23 y.o. 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 here with     ICD-10-CM   1. 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)  G40.409   2. Chronic migraine   G43.709   3. [redacted] weeks gestation of pregnancy  Z3A.24     Michaiah feels that she is doing well overall, however, she has had to seizure since last being seen in July.  She is tolerating levetiracetam with no obvious adverse effects.  I will increase dose to 1500 mg twice daily.  She was advised on appropriate administration every 12 hours.  Side effects discussed and additional information provided in AVS.  She will continue Tylenol for migraine abortion.  She will continue close follow-up with OB.  I have advised that she not drive.  She will follow-up with me in 3 months, sooner if needed.  She verbalizes understanding and agreement with this plan.   No orders of the defined types were placed in this encounter.    No orders of the defined types were placed in this encounter.     I spent 15 minutes with the patient. 50% of  this time was spent counseling and educating patient on plan of care and medications.    Debbora Presto, FNP-C 11/18/2018, 8:08 AM [facility] [address], [place], [place] [address] [phone]

## 2018-11-17 NOTE — Patient Instructions (Addendum)
We will increase levetiracetam 1500mg  (two tablets) twice daily.   Please do not drive until 6 months after last seizure   Follow up in 3 months, sooner if needed   Pregnancy and Epilepsy  Epilepsy is a condition in which a person has repeated seizures over time. Epilepsy can cause problems during pregnancy, but with the right care the chances of having a normal, healthy baby are good. Most women with epilepsy have normal pregnancies and healthy babies. What kind of problems can epilepsy cause during pregnancy? The problems that may happen due to epilepsy depend on whether or not you take medicines for your condition during your pregnancy. Without treatment, epilepsy can put you and your baby at risk for certain problems, including:  More frequent seizures than usual.  Placenta problems.  Premature labor and birth.  Slowed heart rate and low oxygen supply to the baby.  Slower than normal growth in the uterus (intrauterine growth restriction). How is epilepsy treated during pregnancy? During pregnancy, treatment for epilepsy may include:  Taking a medicine that helps prevent seizures (antiepileptic drug). This medicine is commonly prescribed for women who have had seizures within several years of the pregnancy.  Taking a higher than normal doses of folic acid. Folic acid helps prevent your baby from developing spinal cord defects. Working with your health care provider and going to all your prenatal appointments is an important part of treatment. Will my medicine affect my baby? Antiepileptic medicines can affect you and your baby by increasing the risk for:  Vaginal bleeding early in pregnancy.  High blood pressure during pregnancy (preeclampsia).  Birth defects.  Labor induction. This is when you fail to go into labor, and labor has to be started for you.  A cesarean delivery.  Complications after birth. Most of the time, medicines are prescribed anyway because the risk of  the medicines harming your baby is lower than the risk of a seizure harming your baby. If your health care provider prescribes this medicine, he or she will prescribe the safest kind of medicine at the lowest dose that will still prevent seizures. You will also have blood tests often to make sure that the medicine is at a safe level. What if I have a seizure while I am pregnant? Women with epilepsy are more likely to have seizures during pregnancy due to hormonal changes, stress, or a lower dose of seizure medicine. It is important to carefully follow your medicine and seizure-control plans during pregnancy. If you have a seizure during your pregnancy, your risk for early delivery may increase. Will I be able to breastfeed my baby? Women with epilepsy are encouraged to breastfeed. The antiepileptic drug may pass through your breast milk in small amounts, but the amount is usually not enough to affect your baby. Contact a health care provider if:  You think you are having seizures.  You do not feel the baby moving as much as usual.  You develop tiredness or weakness.  You feel like you are going to faint. Get help right away if:  You have very bad abdominal pain.  You have vaginal bleeding.  You do not feel the baby moving.  You have very bad headaches.  You have vision problems.  A part of your body feels numb.  You cannot stop vomiting. Summary  Epilepsy is a condition in which a person has repeated seizures over time.  Most women with epilepsy have normal pregnancies and healthy babies.  Follow your healthcare provider's recommendations  regarding management and treatment of epilepsy during pregnancy. This information is not intended to replace advice given to you by your health care provider. Make sure you discuss any questions you have with your health care provider. Document Released: 12/24/1999 Document Revised: 04/19/2018 Document Reviewed: 03/08/2016 Elsevier Patient  Education  2020 [employer].   Seizure, Adult A seizure is a sudden burst of abnormal electrical activity in the brain. Seizures usually last from 30 seconds to 2 minutes. They can cause many different symptoms. Usually, seizures are not harmful unless they last a long time. What are the causes? Common causes of this condition include:  Fever or infection.  Conditions that affect the brain, such as: ? A brain abnormality that you were born with. ? A brain or head injury. ? Bleeding in the brain. ? A tumor. ? Stroke. ? Brain disorders such as autism or cerebral palsy.  Low blood sugar.  Conditions that are passed from parent to child (are inherited).  Problems with substances, such as: ? Having a reaction to a drug or a medicine. ? Suddenly stopping the use of a substance (withdrawal). In some cases, the cause may not be known. A person who has repeated seizures over time without a clear cause has a condition called epilepsy. What increases the risk? You are more likely to get this condition if you have:  A family history of epilepsy.  Had a seizure in the past.  A brain disorder.  A history of head injury, lack of oxygen at birth, or strokes. What are the signs or symptoms? There are many types of seizures. The symptoms vary depending on the type of seizure you have. Examples of symptoms during a seizure include:  Shaking (convulsions).  Stiffness in the body.  Passing out (losing consciousness).  Head nodding.  Staring.  Not responding to sound or touch.  Loss of bladder control and bowel control. Some people have symptoms right before and right after a seizure happens. Symptoms before a seizure may include:  Fear.  Worry (anxiety).  Feeling like you may vomit (nauseous).  Feeling like the room is spinning (vertigo).  Feeling like you saw or heard something before (dj vu).  Odd tastes or smells.  Changes in how you see. You may see flashing  lights or spots. Symptoms after a seizure happens can include:  Confusion.  Sleepiness.  Headache.  Weakness on one side of the body. How is this treated? Most seizures will stop on their own in under 5 minutes. In these cases, no treatment is needed. Seizures that last longer than 5 minutes will usually need treatment. Treatment can include:  Medicines given through an IV tube.  Avoiding things that are known to cause your seizures. These can include medicines that you take for another condition.  Medicines to treat epilepsy.  Surgery to stop the seizures. This may be needed if medicines do not help. Follow these instructions at home: Medicines  Take over-the-counter and prescription medicines only as told by your doctor.  Do not eat or drink anything that may keep your medicine from working, such as alcohol. Activity  Do not do any activities that would be dangerous if you had another seizure, like driving or swimming. Wait until your doctor says it is safe for you to do them.  If you live in the [place] ask your local DMV (department of motor vehicles) when you can drive.  Get plenty of rest. Teaching others Teach friends and family what to do  when you have a seizure. They should:  Lay you on the ground.  Protect your head and body.  Loosen any tight clothing around your neck.  Turn you on your side.  Not hold you down.  Not put anything into your mouth.  Know whether or not you need emergency care.  Stay with you until you are better.  General instructions  Contact your doctor each time you have a seizure.  Avoid anything that gives you seizures.  Keep a seizure diary. Write down: ? What you think caused each seizure. ? What you remember about each seizure.  Keep all follow-up visits as told by your doctor. This is important. Contact a doctor if:  You have another seizure.  You have seizures more often.  There is any change in what happens during  your seizures.  You keep having seizures with treatment.  You have symptoms of being sick or having an infection. Get help right away if:  You have a seizure that: ? Lasts longer than 5 minutes. ? Is different than seizures you had before. ? Makes it harder to breathe. ? Happens after you hurt your head.  You have any of these symptoms after a seizure: ? Not being able to speak. ? Not being able to use a part of your body. ? Confusion. ? A bad headache.  You have two or more seizures in a row.  You do not wake up right after a seizure.  You get hurt during a seizure. These symptoms may be an emergency. Do not wait to see if the symptoms will go away. Get medical help right away. Call your local emergency services (911 in the [place] Do not drive yourself to the hospital. Summary  Seizures usually last from 30 seconds to 2 minutes. Usually, they are not harmful unless they last a long time.  Do not eat or drink anything that may keep your medicine from working, such as alcohol.  Teach friends and family what to do when you have a seizure.  Contact your doctor each time you have a seizure. This information is not intended to replace advice given to you by your health care provider. Make sure you discuss any questions you have with your health care provider. Document Released: 06/14/2007 Document Revised: 03/15/2018 Document Reviewed: 03/15/2018 Elsevier Patient Education  Latoya Stark.   Levetiracetam tablets What is this medicine? LEVETIRACETAM (lee ve tye RA se tam) is an antiepileptic drug. It is used with other medicines to treat certain types of seizures. This medicine may be used for other purposes; ask your health care provider or pharmacist if you have questions. COMMON BRAND NAME(S): Keppra, Roweepra What should I tell my health care provider before I take this medicine? They need to know if you have any of these conditions:  kidney disease  suicidal  thoughts, plans, or attempt; a previous suicide attempt by you or a family member  an unusual or allergic reaction to levetiracetam, other medicines, foods, dyes, or preservatives  pregnant or trying to get pregnant  breast-feeding How should I use this medicine? Take this medicine by mouth with a glass of water. Follow the directions on the prescription label. Swallow the tablets whole. Do not crush or chew this medicine. You may take this medicine with or without food. Take your doses at regular intervals. Do not take your medicine more often than directed. Do not stop taking this medicine or any of your seizure medicines unless instructed by your  doctor or health care professional. Stopping your medicine suddenly can increase your seizures or their severity. A special MedGuide will be given to you by the pharmacist with each prescription and refill. Be sure to read this information carefully each time. Contact your pediatrician or health care professional regarding the use of this medication in children. While this drug may be prescribed for children as young as 37 years of age for selected conditions, precautions do apply. Overdosage: If you think you have taken too much of this medicine contact a poison control center or emergency room at once. NOTE: This medicine is only for you. Do not share this medicine with others. What if I miss a dose? If you miss a dose, take it as soon as you can. If it is almost time for your next dose, take only that dose. Do not take double or extra doses. What may interact with this medicine? This medicine may interact with the following medications:  carbamazepine  colesevelam  probenecid  sevelamer This list may not describe all possible interactions. Give your health care provider a list of all the medicines, herbs, non-prescription drugs, or dietary supplements you use. Also tell them if you smoke, drink alcohol, or use illegal drugs. Some items may  interact with your medicine. What should I watch for while using this medicine? Visit your doctor or health care provider for a regular check on your progress. Wear a medical identification bracelet or chain to say you have epilepsy, and carry a card that lists all your medications. This medicine may cause serious skin reactions. They can happen weeks to months after starting the medicine. Contact your health care provider right away if you notice fevers or flu-like symptoms with a rash. The rash may be red or purple and then turn into blisters or peeling of the skin. Or, you might notice a red rash with swelling of the face, lips or lymph nodes in your neck or under your arms. It is important to take this medicine exactly as instructed by your health care provider. When first starting treatment, your dose may need to be adjusted. It may take weeks or months before your dose is stable. You should contact your doctor or health care provider if your seizures get worse or if you have any new types of seizures. You may get drowsy or dizzy. Do not drive, use machinery, or do anything that needs mental alertness until you know how this medicine affects you. Do not stand or sit up quickly, especially if you are an older patient. This reduces the risk of dizzy or fainting spells. Alcohol may interfere with the effect of this medicine. Avoid alcoholic drinks. The use of this medicine may increase the chance of suicidal thoughts or actions. Pay special attention to how you are responding while on this medicine. Any worsening of mood, or thoughts of suicide or dying should be reported to your health care provider right away. Women who become pregnant while using this medicine may enroll in the [place] Pregnancy Registry by calling [phone]. This registry collects information about the safety of antiepileptic drug use during pregnancy. What side effects may I notice from receiving this  medicine? Side effects that you should report to your doctor or health care professional as soon as possible:  allergic reactions like skin rash, itching or hives, swelling of the face, lips, or tongue  breathing problems  dark urine  general ill feeling or flu-like symptoms  problems with balance,  talking, walking  rash, fever, and swollen lymph nodes  redness, blistering, peeling or loosening of the skin, including inside the mouth  unusually weak or tired  worsening of mood, thoughts or actions of suicide or dying  yellowing of the eyes or skin Side effects that usually do not require medical attention (report to your doctor or health care professional if they continue or are bothersome):  diarrhea  dizzy, drowsy  headache  loss of appetite This list may not describe all possible side effects. Call your doctor for medical advice about side effects. You may report side effects to FDA at 1-800-FDA-1088. Where should I keep my medicine? Keep out of reach of children. Store at room temperature between 15 and 30 degrees C (59 and 86 degrees F). Throw away any unused medicine after the expiration date. NOTE: This sheet is a summary. It may not cover all possible information. If you have questions about this medicine, talk to your doctor, pharmacist, or health care provider.  2020 Elsevier/Gold Standard (2018-03-29 15:23:36)

## 2018-11-18 ENCOUNTER — Other Ambulatory Visit: Payer: Self-pay

## 2018-11-18 ENCOUNTER — Ambulatory Visit: Payer: Medicaid Other | Admitting: Family Medicine

## 2018-11-18 ENCOUNTER — Encounter: Payer: Self-pay | Admitting: Family Medicine

## 2018-11-18 VITALS — BP 128/82 | HR 107 | Temp 97.6°F | Ht 66.0 in | Wt 309.4 lb

## 2018-11-18 DIAGNOSIS — IMO0002 Reserved for concepts with insufficient information to code with codable children: Secondary | ICD-10-CM

## 2018-11-18 DIAGNOSIS — G43709 Chronic migraine without aura, not intractable, without status migrainosus: Secondary | ICD-10-CM | POA: Diagnosis not present

## 2018-11-18 DIAGNOSIS — Z3A24 24 weeks gestation of pregnancy: Secondary | ICD-10-CM

## 2018-11-18 DIAGNOSIS — G40409 Other generalized epilepsy and epileptic syndromes, not intractable, without status epilepticus: Secondary | ICD-10-CM | POA: Diagnosis not present

## 2018-11-18 MED ORDER — LEVETIRACETAM 750 MG PO TABS: 1500.0000 mg | ORAL_TABLET | Freq: Two times a day (BID) | ORAL | 2 refills | Status: DC

## 2018-11-18 NOTE — Progress Notes (Signed)
I have read the note, and I agree with the clinical assessment and plan.  Richard A. Sater, MD, PhD, FAAN Certified in Neurology, Clinical Neurophysiology, Sleep Medicine, Pain Medicine and Neuroimaging  [facility] [address], [address] [place], [place] [address] [phone]  

## 2018-11-20 ENCOUNTER — Encounter (HOSPITAL_COMMUNITY): Payer: Self-pay

## 2018-11-20 ENCOUNTER — Other Ambulatory Visit: Payer: Self-pay

## 2018-11-20 ENCOUNTER — Inpatient Hospital Stay (HOSPITAL_COMMUNITY)
Admission: AD | Admit: 2018-11-20 | Discharge: 2018-11-20 | Disposition: A | Discharge status: Home / Self Care | Payer: Medicaid Other | Attending: Family Medicine | Admitting: Family Medicine

## 2018-11-20 DIAGNOSIS — O219 Vomiting of pregnancy, unspecified: Secondary | ICD-10-CM

## 2018-11-20 DIAGNOSIS — O0992 Supervision of high risk pregnancy, unspecified, second trimester: Secondary | ICD-10-CM | POA: Diagnosis not present

## 2018-11-20 DIAGNOSIS — O99212 Obesity complicating pregnancy, second trimester: Secondary | ICD-10-CM | POA: Diagnosis not present

## 2018-11-20 DIAGNOSIS — Z7982 Long term (current) use of aspirin: Secondary | ICD-10-CM | POA: Diagnosis not present

## 2018-11-20 DIAGNOSIS — Z3A24 24 weeks gestation of pregnancy: Secondary | ICD-10-CM | POA: Insufficient documentation

## 2018-11-20 DIAGNOSIS — Z880 Allergy status to penicillin: Secondary | ICD-10-CM | POA: Diagnosis not present

## 2018-11-20 DIAGNOSIS — Z882 Allergy status to sulfonamides status: Secondary | ICD-10-CM | POA: Diagnosis not present

## 2018-11-20 DIAGNOSIS — Z885 Allergy status to narcotic agent status: Secondary | ICD-10-CM | POA: Diagnosis not present

## 2018-11-20 DIAGNOSIS — O0990 Supervision of high risk pregnancy, unspecified, unspecified trimester: Secondary | ICD-10-CM

## 2018-11-20 DIAGNOSIS — O99210 Obesity complicating pregnancy, unspecified trimester: Secondary | ICD-10-CM

## 2018-11-20 DIAGNOSIS — Z87898 Personal history of other specified conditions: Secondary | ICD-10-CM

## 2018-11-20 DIAGNOSIS — O212 Late vomiting of pregnancy: Secondary | ICD-10-CM | POA: Insufficient documentation

## 2018-11-20 LAB — URINALYSIS, ROUTINE W REFLEX MICROSCOPIC
Bilirubin Urine: NEGATIVE
Glucose, UA: NEGATIVE mg/dL
Hgb urine dipstick: NEGATIVE
Ketones, ur: NEGATIVE mg/dL
Nitrite: NEGATIVE
Protein, ur: NEGATIVE mg/dL
Specific Gravity, Urine: 1.015 (ref 1.005–1.030)
pH: 7 (ref 5.0–8.0)

## 2018-11-20 MED ORDER — ONDANSETRON 4 MG PO TBDP: 4.0000 mg | ORAL_TABLET | Freq: Three times a day (TID) | ORAL | 0 refills | Status: DC | PRN

## 2018-11-20 MED ORDER — LACTATED RINGERS IV BOLUS
500.0000 mL | Freq: Once | INTRAVENOUS | Status: AC
  Administered 2018-11-20: 07:00:00 500 mL via INTRAVENOUS

## 2018-11-20 MED ORDER — ONDANSETRON HCL 4 MG/2ML IJ SOLN
4.0000 mg | Freq: Once | INTRAMUSCULAR | Status: AC
  Administered 2018-11-20: 05:00:00 4 mg via INTRAMUSCULAR
  Filled 2018-11-20: qty 2

## 2018-11-20 MED ORDER — METOCLOPRAMIDE HCL 5 MG PO TABS: 5.0000 mg | ORAL_TABLET | Freq: Three times a day (TID) | ORAL | 1 refills | Status: DC

## 2018-11-20 MED ORDER — METOCLOPRAMIDE HCL 5 MG/ML IJ SOLN
10.0000 mg | Freq: Once | INTRAMUSCULAR | Status: AC
  Administered 2018-11-20: 10 mg via INTRAVENOUS
  Filled 2018-11-20: qty 2

## 2018-11-20 NOTE — MAU Provider Note (Signed)
History     CSN: [id number]  Arrival date and time: 11/20/18 0429   None     Chief Complaint  Patient presents with  . Nausea  . Morning Sickness   HPI Latoya Stark is a 23 y.o. at 25w5dwho is presenting to MAU complaining of nausea and vomiting since 12:30 yesterday afternoon. It started after eating ribs and Mac&Cheese (homemade, she has had them before). She has had 8 episodes of vomiting. She has tried phenergan, but was unable to keep it down (the pill came up whole). She has had nausea off and on this pregnancy, but it is worse today.  She is feeling baby move, denies LOF or vaginal bleeding/discharge.  OB History    Gravida  4   Para      Term      Preterm      AB  3   Living        SAB  3   TAB      Ectopic      Multiple      Live Births              Past Medical History:  Diagnosis Date  . ADHD (attention deficit hyperactivity disorder)   . Anemia   . Anxiety   . Asthma   . Chronic migraine 08/01/2018  . Depression   . Gonorrhea in female 10/05/2013  . 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 Headache   . Obesity   . [place]    "when I get too hot"  . Trichomonas infection   . UTI (lower urinary tract infection)     Past Surgical History:  Procedure Laterality Date  . ADENOIDECTOMY    . DILATION AND EVACUATION N/A 08/09/2013   Procedure: DILATATION AND EVACUATION;  Surgeon: BFrederico Hamman MD;  [facility];  Service: Gynecology;  Laterality: N/A;  . ORTHOPEDIC SURGERY Left    left hand  . TONSILLECTOMY      Family History  Problem Relation Age of Onset  . Seizures Mother   . Stroke Mother   . Hypertension Father   . Diabetes Maternal Grandmother   . Colon cancer Maternal Grandmother   . Hypertension Maternal Grandmother   . Clotting disorder Maternal Grandmother   . Breast cancer Maternal Grandmother   . Stroke Maternal Grandmother   . Diabetes Paternal Grandmother   . Seizures Maternal Aunt      Social History   Tobacco Use  . Smoking status: Never Smoker  . Smokeless tobacco: Never Used  Substance Use Topics  . Alcohol use: No    Alcohol/week: 0.0 standard drinks  . Drug use: No    Allergies:  Allergies  Allergen Reactions  . Milk-Related Compounds Anaphylaxis    Breaks face out *can have 2% milk*  . Mushroom Extract Complex Anaphylaxis  . Penicillins Anaphylaxis    Has patient had a PCN reaction causing immediate rash, facial/tongue/throat swelling, SOB or lightheadedness with hypotension: Yes Has patient had a PCN reaction causing severe rash involving mucus membranes or skin necrosis: No Has patient had a PCN reaction that required hospitalization No Has patient had a PCN reaction occurring within the last 10 years: Yes If all of the above answers are "NO", then may proceed with Cephalosporin use.   .Elsie AmisOther (See Comments)    Breaks face out  . Latex Itching and Swelling  . Sulfa Antibiotics Swelling  . Vicodin [Hydrocodone-Acetaminophen] Hives  Medications Prior to Admission  Medication Sig Dispense Refill Last Dose  . acetaminophen (TYLENOL) 500 MG tablet Take 1,000 mg by mouth every 6 (six) hours as needed for headache.   11/19/2018 at Unknown time  . aspirin EC 81 MG tablet Take 1 tablet (81 mg total) by mouth daily. Take after 12 weeks for prevention of preeclampsia later in pregnancy 300 tablet 2 11/19/2018 at Unknown time  . Blood Pressure KIT Monitor BP readings at home regularly O09.90 extra large 1 kit 0 11/19/2018 at Unknown time  . cyclobenzaprine (FLEXERIL) 10 MG tablet Take 1 tablet (10 mg total) by mouth 3 (three) times daily as needed for muscle spasms (back pain). 30 tablet 0 11/19/2018 at Unknown time  . docusate sodium (COLACE) 100 MG capsule Take 1 capsule (100 mg total) by mouth 2 (two) times daily. 60 capsule 2 11/19/2018 at Unknown time  . Doxylamine-Pyridoxine (DICLEGIS) 10-10 MG TBEC Take 2 tablets by mouth at bedtime. If  symptoms persist, add one tablet in the morning and one in the afternoon 100 tablet 5 11/19/2018 at Unknown time  . Elastic Bandages & Supports (COMFORT FIT MATERNITY SUPP LG) MISC 1 application by Does not apply route daily. 1 each 0 11/19/2018 at Unknown time  . folic acid (FOLVITE) 1 MG tablet Take 1 tablet (1 mg total) by mouth daily. 30 tablet 10 11/19/2018 at Unknown time  . levETIRAcetam (KEPPRA) 750 MG tablet Take 2 tablets (1,500 mg total) by mouth 2 (two) times daily. One po bid 120 tablet 2 11/19/2018 at Unknown time  . pantoprazole (PROTONIX) 40 MG tablet Take 1 tablet (40 mg total) by mouth daily. 30 tablet 1 11/19/2018 at Unknown time  . polyethylene glycol (MIRALAX / GLYCOLAX) 17 g packet Take 17 g by mouth 2 (two) times daily. 24 each 3 11/19/2018 at Unknown time  . Prenat w/o A-FeCbn-Meth-FA-DHA (PRENATE MINI) 29-0.6-0.4-350 MG CAPS Take 1 capsule by mouth daily before breakfast. 30 capsule 11 11/19/2018 at Unknown time  . promethazine (PHENERGAN) 25 MG tablet Take 1 tablet (25 mg total) by mouth every 6 (six) hours as needed for nausea or vomiting. 30 tablet 1 11/20/2018 at Unknown time  . senna (SENOKOT) 8.6 MG TABS tablet Take 1 tablet (8.6 mg total) by mouth daily. 120 tablet 0 11/19/2018 at Unknown time  . terconazole (TERAZOL 7) 0.4 % vaginal cream Place 1 applicator vaginally at bedtime. Use for seven days 45 g 0 Past Week at Unknown time  . albuterol (PROVENTIL HFA;VENTOLIN HFA) 108 (90 Base) MCG/ACT inhaler Inhale 2 puffs into the lungs every 6 (six) hours as needed for wheezing or shortness of breath. 1 Inhaler 0 More than a month at Unknown time    Review of Systems  Constitutional: Negative.   HENT: Negative.   Eyes: Negative.   Respiratory: Negative.   Cardiovascular: Negative.   Gastrointestinal: Positive for nausea and vomiting. Negative for abdominal pain, constipation and diarrhea.  Endocrine: Negative.   Genitourinary: Negative.   Musculoskeletal: Negative.    Skin: Negative.   Allergic/Immunologic: Negative.   Neurological: Negative.   Hematological: Negative.   Psychiatric/Behavioral: Negative.    Physical Exam   Blood pressure (!) 114/57, pulse 100, temperature 98.1 F (36.7 C), temperature source Oral, resp. rate 20, height _0  (1.676 m), weight (!) 137.6 kg, last menstrual period 05/31/2018, SpO2 100 %.  Physical Exam  Nursing note and vitals reviewed. Constitutional: She is oriented to person, place, and time. She appears well-developed and well-nourished.  Obese  HENT:  Head: Normocephalic and atraumatic.  Eyes: Pupils are equal, round, and reactive to light. Conjunctivae and EOM are normal.  Neck: Normal range of motion. Neck supple.  Cardiovascular: Normal rate, regular rhythm, normal heart sounds and intact distal pulses.  Respiratory: Effort normal and breath sounds normal.  GI: Soft. Bowel sounds are normal. She exhibits no distension and no mass. There is no abdominal tenderness. There is no rebound and no guarding.  Musculoskeletal: Normal range of motion.  Neurological: She is alert and oriented to person, place, and time. She has normal reflexes.  Skin: Skin is warm and dry.  Psychiatric: She has a normal mood and affect. Her behavior is normal. Judgment and thought content normal.    MAU Course  Procedures  MDM -UA -IM Zofran then PO challenge  NST: reactive Baseline: 120-125 Variability: moderate Accels: present Decels: absent  RECHECK 0630: -Vomited after PO challenge  -IVF 500 mg per patient request -IV Reglan  RECHECK 0700: -Nausea greatly improved -PO Challenge  RECHECK 1779: -Passed PO challenge, tolerated well -Wants to DC home  Assessment and Plan  23 yo [id number] at 24.5 EGA who presented with nausea and vomiting after eating ribs and Mac & Cheese -Responded well to Zofran/Reglan combo, scripts sent to pharmacy -DC phenergan -Discussed diet and nutrition during pregnancy -Strict return  precautions given -Follow up with OB provider as scheduled on 11/29/18   Kateline Kinkade L Vernesha Talbot 11/20/2018, 5:46 AM

## 2018-11-20 NOTE — MAU Note (Signed)
.   Latoya Stark is a 23 y.o. at [redacted]w[redacted]d here in MAU reporting: nausea and vomiting since 1230 yesterday afternoon. She states she cannot keep anything down and has vomited 7 times. No VB or LOF. +FM. Pain score: 6 Vitals:   11/20/18 0450  BP: (!) 114/57  Pulse: 100  Resp: 20  Temp: 98.1 F (36.7 C)  SpO2: 100%     FHT:128 Lab orders placed from triage:

## 2018-11-20 NOTE — Discharge Instructions (Signed)
Eating Plan for Pregnant Women While you are pregnant, your body requires additional nutrition to help support your growing baby. You also have a higher need for some vitamins and minerals, such as folic acid, calcium, iron, and vitamin D. Eating a healthy, well-balanced diet is very important for your health and your baby's health. Your need for extra calories varies for the three 53-monthsegments of your pregnancy (trimesters). For most women, it is recommended to consume:  150 extra calories a day during the first trimester.  300 extra calories a day during the second trimester.  300 extra calories a day during the third trimester. What are tips for following this plan?   Do not try to lose weight or go on a diet during pregnancy.  Limit your overall intake of foods that have "empty calories." These are foods that have little nutritional value, such as sweets, desserts, candies, and sugar-sweetened beverages.  Eat a variety of foods (especially fruits and vegetables) to get a full range of vitamins and minerals.  Take a prenatal vitamin to help meet your additional vitamin and mineral needs during pregnancy, specifically for folic acid, iron, calcium, and vitamin D.  Remember to stay active. Ask your health care provider what types of exercise and activities are safe for you.  Practice good food safety and cleanliness. Wash your hands before you eat and after you prepare raw meat. Wash all fruits and vegetables well before peeling or eating. Taking these actions can help to prevent food-borne illnesses that can be very dangerous to your baby, such as listeriosis. Ask your health care provider for more information about listeriosis. What does 150 extra calories look like? Healthy options that provide 150 extra calories each day could be any of the following:  6-8 oz (170-230 g) of plain low-fat yogurt with  cup of berries.  1 apple with 2 teaspoons (11 g) of peanut butter.  Cut-up  vegetables with  cup (60 g) of hummus.  8 oz (230 mL) or 1 cup of low-fat chocolate milk.  1 stick of string cheese with 1 medium orange.  1 peanut butter and jelly sandwich that is made with one slice of whole-wheat bread and 1 tsp (5 g) of peanut butter. For 300 extra calories, you could eat two of those healthy options each day. What is a healthy amount of weight to gain? The right amount of weight gain for you is based on your BMI before you became pregnant. If your BMI:  Was less than 18 (underweight), you should gain 28-40 lb (13-18 kg).  Was 18-24.9 (normal), you should gain 25-35 lb (11-16 kg).  Was 25-29.9 (overweight), you should gain 15-25 lb (7-11 kg).  Was 30 or greater (obese), you should gain 11-20 lb (5-9 kg). What if I am having twins or multiples? Generally, if you are carrying twins or multiples:  You may need to eat 300-600 extra calories a day.  The recommended range for total weight gain is 25-54 lb (11-25 kg), depending on your BMI before pregnancy.  Talk with your health care provider to find out about nutritional needs, weight gain, and exercise that is right for you. What foods can I eat?  Grains All grains. Choose whole grains, such as whole-wheat bread, oatmeal, or brown rice. Vegetables All vegetables. Eat a variety of colors and types of vegetables. Remember to wash your vegetables well before peeling or eating. Fruits All fruits. Eat a variety of colors and types of fruit. Remember to wash  your fruits well before peeling or eating. Meats and other protein foods Lean meats, including chicken, [place], fish, and lean cuts of beef, veal, or pork. If you eat fish or seafood, choose options that are higher in omega-3 fatty acids and lower in mercury, such as salmon, herring, mussels, trout, sardines, pollock, shrimp, crab, and lobster. Tofu. Tempeh. Beans. Eggs. Peanut butter and other nut butters. Make sure that all meats, poultry, and eggs are cooked to  food-safe temperatures or "well-done." Two or more servings of fish are recommended each week in order to get the most benefits from omega-3 fatty acids that are found in seafood. Choose fish that are lower in mercury. You can find more information online:  [employer]Gaming.ch Dairy Pasteurized milk and milk alternatives (such as almond milk). Pasteurized yogurt and pasteurized cheese. Cottage cheese. Sour cream. Beverages Water. Juices that contain 100% fruit juice or vegetable juice. Caffeine-free teas and decaffeinated coffee. Drinks that contain caffeine are okay to drink, but it is better to avoid caffeine. Keep your total caffeine intake to less than 200 mg each day (which is 12 oz or 355 mL of coffee, tea, or soda) or the limit as told by your health care provider. Fats and oils Fats and oils are okay to include in moderation. Sweets and desserts Sweets and desserts are okay to include in moderation. Seasoning and other foods All pasteurized condiments. The items listed above may not be a complete list of recommended foods and beverages. Contact your dietitian for more options. The items listed above may not be a complete list of foods and beverages [you/your child] can eat. Contact a dietitian for more information. What foods are not recommended? Vegetables Raw (unpasteurized) vegetable juices. Fruits Unpasteurized fruit juices. Meats and other protein foods Lunch meats, bologna, hot dogs, or other deli meats. (If you must eat those meats, reheat them until they are steaming hot.) Refrigerated pat, meat spreads from a meat counter, smoked seafood that is found in the refrigerated section of a store. Raw or undercooked meats, poultry, and eggs. Raw fish, such as sushi or sashimi. Fish that have high mercury content, such as tilefish, shark, swordfish, and king mackerel. To learn more about mercury in fish, talk with your health care provider or look for online resources, such  as:  [employer]aming.ch Dairy Raw (unpasteurized) milk and any foods that have raw milk in them. Soft cheeses, such as feta, queso blanco, queso fresco, Brie, Camembert cheeses, blue-veined cheeses, and Panela cheese (unless it is made with pasteurized milk, which must be stated on the label). Beverages Alcohol. Sugar-sweetened beverages, such as sodas, teas, or energy drinks. Seasoning and other foods Homemade fermented foods and drinks, such as pickles, sauerkraut, or kombucha drinks. (Store-bought pasteurized versions of these are okay.) Salads that are made in a store or deli, such as ham salad, chicken salad, egg salad, tuna salad, and seafood salad. The items listed above may not be a complete list of foods and beverages to avoid. Contact your dietitian for more information. The items listed above may not be a complete list of foods and beverages [you/your child] should avoid. Contact a dietitian for more information. Where to find more information To calculate the number of calories you need based on your height, weight, and activity level, you can use an online calculator such as:  MobileTransition.ch To calculate how much weight you should gain during pregnancy, you can use an online pregnancy weight gain calculator such as:  StreamingFood.com.cy Summary  While you  are pregnant, your body requires additional nutrition to help support your growing baby.  Eat a variety of foods, especially fruits and vegetables to get a full range of vitamins and minerals.  Practice good food safety and cleanliness. Wash your hands before you eat and after you prepare raw meat. Wash all fruits and vegetables well before peeling or eating. Taking these actions can help to prevent food-borne illnesses, such as listeriosis, that can be very dangerous to your baby.  Do not eat raw meat or fish. Do not eat fish that have high mercury content, such as tilefish,  shark, swordfish, and king mackerel. Do not eat unpasteurized (raw) dairy.  Take a prenatal vitamin to help meet your additional vitamin and mineral needs during pregnancy, specifically for folic acid, iron, calcium, and vitamin D. This information is not intended to replace advice given to you by your health care provider. Make sure you discuss any questions you have with your health care provider. Document Released: 10/10/2013 Document Revised: 04/18/2018 Document Reviewed: 09/22/2016 Elsevier Patient Education  [place].   Nausea and Vomiting, Adult Nausea is feeling sick to your stomach or feeling that you are about to throw up (vomit). Vomiting is when food in your stomach is thrown up and out of the mouth. Throwing up can make you feel weak. It can also make you lose too much water in your body (get dehydrated). If you lose too much water in your body, you may:  Feel tired.  Feel thirsty.  Have a dry mouth.  Have cracked lips.  Go pee (urinate) less often. Older adults and people with other diseases or a weak body defense system (immune system) are at higher risk for losing too much water in the body. If you feel sick to your stomach and you throw up, it is important to follow instructions from your doctor about how to take care of yourself. Follow these instructions at home: Watch your symptoms for any changes. Tell your doctor about them. Follow these instructions to care for yourself at home. Eating and drinking      Take an ORS (oral rehydration solution). This is a drink that is sold at pharmacies and stores.  Drink clear fluids in small amounts as you are able, such as: ? Water. ? Ice chips. ? Fruit juice that has water added (diluted fruit juice). ? Low-calorie sports drinks.  Eat bland, easy-to-digest foods in small amounts as you are able, such as: ? Bananas. ? Applesauce. ? Rice. ? Low-fat (lean) meats. ? Toast. ? Crackers.  Avoid drinking fluids  that have a lot of sugar or caffeine in them. This includes energy drinks, sports drinks, and soda.  Avoid alcohol.  Avoid spicy or fatty foods. General instructions  Take over-the-counter and prescription medicines only as told by your doctor.  Drink enough fluid to keep your pee (urine) pale yellow.  Wash your hands often with soap and water. If you cannot use soap and water, use hand sanitizer.  Make sure that all people in your home wash their hands well and often.  Rest at home while you get better.  Watch your condition for any changes.  Take slow and deep breaths when you feel sick to your stomach.  Keep all follow-up visits as told by your doctor. This is important. Contact a doctor if:  Your symptoms get worse.  You have new symptoms.  You have a fever.  You cannot drink fluids without throwing up.  You feel  sick to your stomach for more than 2 days.  You feel light-headed or dizzy.  You have a headache.  You have muscle cramps.  You have a rash.  You have pain while peeing. Get help right away if:  You have pain in your chest, neck, arm, or jaw.  You feel very weak or you pass out (faint).  You throw up again and again.  You have throw up that is bright red or looks like black coffee grounds.  You have bloody or black poop (stools) or poop that looks like tar.  You have a very bad headache, a stiff neck, or both.  You have very bad pain, cramping, or bloating in your belly (abdomen).  You have trouble breathing.  You are breathing very quickly.  Your heart is beating very quickly.  Your skin feels cold and clammy.  You feel confused.  You have signs of losing too much water in your body, such as: ? Dark pee, very little pee, or no pee. ? Cracked lips. ? Dry mouth. ? Sunken eyes. ? Sleepiness. ? Weakness. These symptoms may be an emergency. Do not wait to see if the symptoms will go away. Get medical help right away. Call your local  emergency services (911 in the [place] Do not drive yourself to the hospital. Summary  Nausea is feeling sick to your stomach or feeling that you are about to throw up (vomit). Vomiting is when food in your stomach is thrown up and out of the mouth.  Follow instructions from your doctor about eating and drinking to keep from losing too much water in your body.  Take over-the-counter and prescription medicines only as told by your doctor.  Contact your doctor if your symptoms get worse or you have new symptoms.  Keep all follow-up visits as told by your doctor. This is important. This information is not intended to replace advice given to you by your health care provider. Make sure you discuss any questions you have with your health care provider. Document Released: 06/14/2007 Document Revised: 04/19/2018 Document Reviewed: 06/05/2017 Elsevier Patient Education  2020 [employer].

## 2018-11-23 ENCOUNTER — Other Ambulatory Visit: Payer: Self-pay

## 2018-11-23 ENCOUNTER — Inpatient Hospital Stay (HOSPITAL_COMMUNITY): Admit: 2018-11-23 | Payer: Medicaid Other | Admitting: Obstetrics and Gynecology

## 2018-11-23 ENCOUNTER — Encounter (HOSPITAL_COMMUNITY): Payer: Self-pay

## 2018-11-23 ENCOUNTER — Inpatient Hospital Stay (HOSPITAL_COMMUNITY)
Admission: AD | Admit: 2018-11-23 | Discharge: 2018-11-24 | DRG: 833 | Disposition: A | Discharge status: Home / Self Care | Payer: Medicaid Other | Attending: Obstetrics and Gynecology | Admitting: Obstetrics and Gynecology

## 2018-11-23 DIAGNOSIS — G40909 Epilepsy, unspecified, not intractable, without status epilepticus: Secondary | ICD-10-CM | POA: Diagnosis not present

## 2018-11-23 DIAGNOSIS — O99350 Diseases of the nervous system complicating pregnancy, unspecified trimester: Secondary | ICD-10-CM

## 2018-11-23 DIAGNOSIS — Z3A25 25 weeks gestation of pregnancy: Secondary | ICD-10-CM | POA: Diagnosis not present

## 2018-11-23 DIAGNOSIS — O99352 Diseases of the nervous system complicating pregnancy, second trimester: Secondary | ICD-10-CM | POA: Diagnosis present

## 2018-11-23 DIAGNOSIS — G40409 Other generalized epilepsy and epileptic syndromes, not intractable, without status epilepticus: Secondary | ICD-10-CM | POA: Diagnosis present

## 2018-11-23 LAB — DIC (DISSEMINATED INTRAVASCULAR COAGULATION)PANEL
D-Dimer, Quant: 1.08 ug/mL-FEU — ABNORMAL HIGH (ref 0.00–0.50)
Fibrinogen: 486 mg/dL — ABNORMAL HIGH (ref 210–475)
INR: 1.2 (ref 0.8–1.2)
Platelets: 289 10*3/uL (ref 150–400)
Prothrombin Time: 14.8 seconds (ref 11.4–15.2)
Smear Review: NONE SEEN
aPTT: 25 seconds (ref 24–36)

## 2018-11-23 LAB — COMPREHENSIVE METABOLIC PANEL
ALT: 24 U/L (ref 0–44)
AST: 24 U/L (ref 15–41)
Albumin: 3.1 g/dL — ABNORMAL LOW (ref 3.5–5.0)
Alkaline Phosphatase: 84 U/L (ref 38–126)
Anion gap: 12 (ref 5–15)
BUN: <5 mg/dL — ABNORMAL LOW (ref 6–20)
CO2: 20 mmol/L — ABNORMAL LOW (ref 22–32)
Calcium: 9 mg/dL (ref 8.9–10.3)
Chloride: 104 mmol/L (ref 98–111)
Creatinine, Ser: 0.64 mg/dL (ref 0.44–1.00)
GFR calc Af Amer: >60 mL/min (ref >60)
GFR calc non Af Amer: >60 mL/min (ref >60)
Glucose, Bld: 118 mg/dL — ABNORMAL HIGH (ref 70–99)
Potassium: 3.7 mmol/L (ref 3.5–5.1)
Sodium: 136 mmol/L (ref 135–145)
Total Bilirubin: 0.3 mg/dL (ref 0.3–1.2)
Total Protein: 6.6 g/dL (ref 6.5–8.1)

## 2018-11-23 LAB — ABO/RH: ABO/RH(D): AB POS

## 2018-11-23 LAB — CBC
HCT: 33.2 % — ABNORMAL LOW (ref 36.0–46.0)
Hemoglobin: 11.1 g/dL — ABNORMAL LOW (ref 12.0–15.0)
MCH: 29.4 pg (ref 26.0–34.0)
MCHC: 33.4 g/dL (ref 30.0–36.0)
MCV: 87.8 fL (ref 80.0–100.0)
Platelets: 304 10*3/uL (ref 150–400)
RBC: 3.78 MIL/uL — ABNORMAL LOW (ref 3.87–5.11)
RDW: 13.5 % (ref 11.5–15.5)
WBC: 9.6 10*3/uL (ref 4.0–10.5)
nRBC: 0 % (ref 0.0–0.2)

## 2018-11-23 LAB — LIPASE, BLOOD: Lipase: 23 U/L (ref 11–51)

## 2018-11-23 LAB — PROTEIN / CREATININE RATIO, URINE
Creatinine, Urine: 184.05 mg/dL
Protein Creatinine Ratio: 0.26 mg/mg{Cre} — ABNORMAL HIGH (ref 0.00–0.15)
Total Protein, Urine: 47 mg/dL

## 2018-11-23 LAB — TYPE AND SCREEN
ABO/RH(D): AB POS
Antibody Screen: NEGATIVE

## 2018-11-23 MED ORDER — ONDANSETRON 4 MG PO TBDP: 4.0000 mg | ORAL_TABLET | Freq: Three times a day (TID) | ORAL | Status: DC | PRN

## 2018-11-23 MED ORDER — LORAZEPAM 2 MG/ML IJ SOLN
INTRAMUSCULAR | Status: AC
  Filled 2018-11-23: qty 1

## 2018-11-23 MED ORDER — MAGNESIUM SULFATE BOLUS VIA INFUSION
4.0000 g | Freq: Once | INTRAVENOUS | Status: DC
  Filled 2018-11-23: qty 1000

## 2018-11-23 MED ORDER — BETAMETHASONE SOD PHOS & ACET 6 (3-3) MG/ML IJ SUSP
12.0000 mg | INTRAMUSCULAR | Status: AC
  Administered 2018-11-23 – 2018-11-24 (×2): 12 mg via INTRAMUSCULAR
  Filled 2018-11-23 (×2): qty 5

## 2018-11-23 MED ORDER — ALBUTEROL SULFATE (2.5 MG/3ML) 0.083% IN NEBU: 3.0000 mL | INHALATION_SOLUTION | Freq: Four times a day (QID) | RESPIRATORY_TRACT | Status: DC | PRN

## 2018-11-23 MED ORDER — LEVETIRACETAM 500 MG PO TABS
1500.0000 mg | ORAL_TABLET | Freq: Two times a day (BID) | ORAL | Status: DC
  Administered 2018-11-24: 1500 mg via ORAL
  Filled 2018-11-23: qty 3

## 2018-11-23 MED ORDER — LACTATED RINGERS IV SOLN
INTRAVENOUS | Status: DC
  Administered 2018-11-23: 16:00:00 via INTRAVENOUS

## 2018-11-23 MED ORDER — MAGNESIUM SULFATE 40 GM/1000ML IV SOLN
2.0000 g/h | INTRAVENOUS | Status: DC
  Administered 2018-11-23: 2 g/h via INTRAVENOUS

## 2018-11-23 MED ORDER — LORAZEPAM 2 MG/ML IJ SOLN
1.0000 mg | Freq: Once | INTRAMUSCULAR | Status: AC
  Administered 2018-11-23: 1 mg via INTRAVENOUS
  Filled 2018-11-23: qty 1

## 2018-11-23 MED ORDER — PANTOPRAZOLE SODIUM 40 MG PO TBEC
40.0000 mg | DELAYED_RELEASE_TABLET | Freq: Every day | ORAL | Status: DC
  Administered 2018-11-23 – 2018-11-24 (×2): 40 mg via ORAL
  Filled 2018-11-23 (×2): qty 1

## 2018-11-23 MED ORDER — ACETAMINOPHEN 325 MG PO TABS: 650.0000 mg | ORAL_TABLET | ORAL | Status: DC | PRN

## 2018-11-23 MED ORDER — MAGNESIUM SULFATE BOLUS VIA INFUSION
6.0000 g | Freq: Once | INTRAVENOUS | Status: AC
  Administered 2018-11-23: 6 g via INTRAVENOUS

## 2018-11-23 MED ORDER — LEVETIRACETAM 500 MG PO TABS
2000.0000 mg | ORAL_TABLET | Freq: Two times a day (BID) | ORAL | Status: DC
  Administered 2018-11-23: 2000 mg via ORAL
  Filled 2018-11-23: qty 4
  Filled 2018-11-23: qty 1

## 2018-11-23 MED ORDER — LACTATED RINGERS IV BOLUS
1000.0000 mL | Freq: Once | INTRAVENOUS | Status: AC
  Administered 2018-11-23: 1000 mL via INTRAVENOUS

## 2018-11-23 MED ORDER — MAGNESIUM SULFATE 40 GM/1000ML IV SOLN
2.0000 g/h | INTRAVENOUS | Status: DC
  Filled 2018-11-23: qty 1000

## 2018-11-23 NOTE — MAU Provider Note (Signed)
Chief Complaint:  Abdominal Pain and Seizures   None     HPI: Latoya Stark is a 23 y.o. G4P0030 at 23w1dby LMP who presents to maternity admissions via EMS for seizure at home.  Report from EMS that pt boyfriend called because he woke up to her lying on the floor next to the bed and she was upset and crying. This is how she usually is following a seizure.  EMS witnessed a grand mal seizure lasting 1 minute [facility]  Pt initially not responding to questions but within minutes of arrival in MAU, was more alert and responsive and able to answer questions about her pregnancy, health history, and prenatal care.  She had appointment with neurology on 11/20/18 and Keppra dose was increased to 1500 BID but dosing was changed from TID to BID.  Pt reports she had a h/a so she had something to eat and was going to lie down and that's the last thing she remembers.  She is feeling normal fetal movement in MAU.  She reports pain in her left mid/lower abdomen that is constant cramping pain that does not radiate.   HPI  Past Medical History: Past Medical History:  Diagnosis Date  . ADHD (attention deficit hyperactivity disorder)   . Anemia   . Anxiety   . Asthma   . Chronic migraine 08/01/2018  . Depression   . Gonorrhea in female 10/05/2013  . 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  . Headache   . Obesity   . [place] [place]    "when I get too hot"  . Trichomonas infection   . UTI (lower urinary tract infection)     Past obstetric history: OB History  Gravida Para Term Preterm AB Living  4       3    SAB TAB Ectopic Multiple Live Births  3            # Outcome Date GA Lbr Len/2nd Weight Sex Delivery Anes PTL Lv  4 Current           3 SAB 10/10/14          2 SAB           1 SAB              Birth Comments: System Generated. Please review and update pregnancy details.    Past Surgical History: Past Surgical History:  Procedure Laterality Date  . ADENOIDECTOMY     . DILATION AND EVACUATION N/A 08/09/2013   Procedure: DILATATION AND EVACUATION;  Surgeon: BFrederico Hamman MD;  [facility];  Service: Gynecology;  Laterality: N/A;  . ORTHOPEDIC SURGERY Left    left hand  . TONSILLECTOMY      Family History: Family History  Problem Relation Age of Onset  . Seizures Mother   . Stroke Mother   . Hypertension Father   . Diabetes Maternal Grandmother   . Colon cancer Maternal Grandmother   . Hypertension Maternal Grandmother   . Clotting disorder Maternal Grandmother   . Breast cancer Maternal Grandmother   . Stroke Maternal Grandmother   . Diabetes Paternal Grandmother   . Seizures Maternal Aunt     Social History: Social History   Tobacco Use  . Smoking status: Never Smoker  . Smokeless tobacco: Never Used  Substance Use Topics  . Alcohol use: No    Alcohol/week: 0.0 standard drinks  . Drug use: No    Allergies:  Allergies  Allergen Reactions  . Milk-Related Compounds Anaphylaxis    Breaks face out *can have 2% milk*  . Mushroom Extract Complex Anaphylaxis  . Penicillins Anaphylaxis    Has patient had a PCN reaction causing immediate rash, facial/tongue/throat swelling, SOB or lightheadedness with hypotension: Yes Has patient had a PCN reaction causing severe rash involving mucus membranes or skin necrosis: No Has patient had a PCN reaction that required hospitalization No Has patient had a PCN reaction occurring within the last 10 years: Yes If all of the above answers are "NO", then may proceed with Cephalosporin use.   [name] Other (See Comments)    Breaks face out  . Latex Itching and Swelling  . Sulfa Antibiotics Swelling  . Vicodin [Hydrocodone-Acetaminophen] Hives    Meds:  Medications Prior to Admission  Medication Sig Dispense Refill Last Dose  . acetaminophen (TYLENOL) 500 MG tablet Take 1,000 mg by mouth every 6 (six) hours as needed for headache.     . albuterol (PROVENTIL HFA;VENTOLIN HFA) 108 (90  Base) MCG/ACT inhaler Inhale 2 puffs into the lungs every 6 (six) hours as needed for wheezing or shortness of breath. 1 Inhaler 0   . aspirin EC 81 MG tablet Take 1 tablet (81 mg total) by mouth daily. Take after 12 weeks for prevention of preeclampsia later in pregnancy 300 tablet 2   . Blood Pressure KIT Monitor BP readings at home regularly O09.90 extra large 1 kit 0   . cyclobenzaprine (FLEXERIL) 10 MG tablet Take 1 tablet (10 mg total) by mouth 3 (three) times daily as needed for muscle spasms (back pain). 30 tablet 0   . docusate sodium (COLACE) 100 MG capsule Take 1 capsule (100 mg total) by mouth 2 (two) times daily. 60 capsule 2   . Doxylamine-Pyridoxine (DICLEGIS) 10-10 MG TBEC Take 2 tablets by mouth at bedtime. If symptoms persist, add one tablet in the morning and one in the afternoon 100 tablet 5   . Elastic Bandages & Supports (COMFORT FIT MATERNITY SUPP LG) MISC 1 application by Does not apply route daily. 1 each 0   . folic acid (FOLVITE) 1 MG tablet Take 1 tablet (1 mg total) by mouth daily. 30 tablet 10   . levETIRAcetam (KEPPRA) 750 MG tablet Take 2 tablets (1,500 mg total) by mouth 2 (two) times daily. One po bid 120 tablet 2   . metoCLOPramide (REGLAN) 5 MG tablet Take 1 tablet (5 mg total) by mouth 3 (three) times daily. 90 tablet 1   . ondansetron (ZOFRAN ODT) 4 MG disintegrating tablet Take 1 tablet (4 mg total) by mouth every 8 (eight) hours as needed for nausea or vomiting. 20 tablet 0   . pantoprazole (PROTONIX) 40 MG tablet Take 1 tablet (40 mg total) by mouth daily. 30 tablet 1   . polyethylene glycol (MIRALAX / GLYCOLAX) 17 g packet Take 17 g by mouth 2 (two) times daily. 24 each 3   . Prenat w/o A-FeCbn-Meth-FA-DHA (PRENATE MINI) 29-0.6-0.4-350 MG CAPS Take 1 capsule by mouth daily before breakfast. 30 capsule 11   . senna (SENOKOT) 8.6 MG TABS tablet Take 1 tablet (8.6 mg total) by mouth daily. 120 tablet 0   . terconazole (TERAZOL 7) 0.4 % vaginal cream Place 1  applicator vaginally at bedtime. Use for seven days 45 g 0     ROS:  Review of Systems  Constitutional: Negative for chills and fever.  Respiratory: Negative for shortness of breath.   Cardiovascular: Negative for chest  pain.  Gastrointestinal: Positive for abdominal pain. Negative for nausea and vomiting.  Neurological: Negative for dizziness and headaches.  Psychiatric/Behavioral: Positive for agitation and confusion.     I have reviewed patient's Past Medical Hx, Surgical Hx, Family Hx, Social Hx, medications and allergies.   Physical Exam   Patient Vitals for the past 24 hrs:  BP Temp src Pulse Resp SpO2  11/23/18 1540 132/79 Oral (!) 126 20 97 %   Constitutional: Well-developed, well-nourished female in no moderate distress.  HEART: normal rate, heart sounds, regular rhythm RESP: normal effort, lung sounds clear and equal bilaterally GI: Abd soft, non-tender, gravid appropriate for gestational age.  MS: Extremities nontender, no edema, normal ROM Neurological - Pt initially not alert when arrived from EMS and not responding to voice or light stimuli.  Within 3-5 minutes of arrival to MAU, pt responding to voice and opening eyes.  Then within 10 minutes, she is alert and oriented and answering questions appropriately. PERRLA, neck supple without rigidity, cranial nerves II through XII intact, DTR's normal and symmetric, motor and sensory grossly normal bilaterally GU: Neg CVAT.      FHT:  Baseline 130, moderate variability, accelerations present, no decelerations Contractions: None on toco or to palpation   Labs: No results found for this or any previous visit (from the past 24 hour(s)). AB/Positive/-- (07/21 1150)  Imaging:  No results found.  MAU Course/MDM: Orders Placed This Encounter  Procedures  . CBC  . Comprehensive metabolic panel  . Protein / creatinine ratio, urine  . Lipase, blood  . DIC panel  . Type and screen    Meds ordered this encounter   Medications  . lactated ringers bolus 1,000 mL     Dr Ilda Basset paged to room upon pt arrival. VS stable, FHR wnl when monitors applied.  Pt initially not responsive to voice and light stimuli but quickly became responsive and able to answer some questions but remained postictal for several minutes.  BP elevated with EMS and upon arrival in MAU so although pt has seizure disorder, eclampsia is not ruled out.  NST reviewed and reactive. Labs pending.  Pt remains stable with normal FHR tracing.  Pt admitted to antepartum for observation and further evaluation.    Assessment:  1. Seizure disorder during pregnancy in second trimester [facility])    Plan: Admit to [facility] See H&P    Fatima Blank Certified Nurse-Midwife 11/23/2018 3:58 PM

## 2018-11-23 NOTE — MAU Note (Signed)
Patient informs RN that she remembers feeling really dizzy and having a headache. States she ate something and then went to lay down in the bed. Then she states she woke up on the floor.

## 2018-11-23 NOTE — MAU Note (Signed)
Latoya Stark is a 23 y.o. at [redacted]w[redacted]d here in MAU reporting:  For seizures. History epilepsy. On kepra. Was 3 times a day but was lowered to 2 times a day on Monday. Post ictal on arrival. Ems states the following: Husband called ems after being awaken to her screams. He found her on the floor. Ems witnessed a "grand mal" that last 2 minutes at around 1500. States has been unresponsive to pain stimuli the past 25 minutes. (patient becoming more alert and oriented since arrival to mau) BP by fire department was 146/100. Ems bp was 164/113 hr 140 94% on 3L RR 12. CBG 120'S.  20G IV saline locked in Left hand. Reports no medications were given.   Patient reporting 10/10 left lower abdominal pain that is sharp and constant.  FHT: 130's.  Latoya Stark CNM to bedside on patient arrival.  Dr. Ilda Basset called to bedside.  Verbal order for labs and LR received.

## 2018-11-23 NOTE — Progress Notes (Signed)
While talking to patient and about to give dose of Keppra 2000mg s, patient complained of severe headache followed by a grand mal seizure which lasted 3 min .  In 5 min,  Another seizure followed lasting imin.  Patient attached to fetal monitoring and vital signs monitored.  Patient gained full consciousness after 2nd seizure but claims feeling sleepy.   Dr Ilda Basset at bedside .  Will continue to monitor.

## 2018-11-23 NOTE — H&P (Signed)
Obstetrics Admission History & Physical  11/23/2018 - 5:56 PM Primary OBGYN: [facility]  Chief Complaint: seizure at 1400 today  History of Present Illness  23 y.o. [id number] @ [redacted]w[redacted]d with the above CC. Pregnancy complicated by: long standing seizure d/o. BMI 40s, asthma.  Ms. DLILLIAUNA VANstates she went to sleep this afternoon and woke up seizing. Her partner was also sleeping and saw her having a seizure. She states that she feel asleep on the bed and woke up off it and her left belly feels sore only. No contractions, leakage of fluid, decreased FM or chest pain, sob, head soreness. They confirm that they do take their keppra (see neuro notes, most recently from 11/9)  Review of Systems:  as noted in the History of Present Illness.  PMHx:  Past Medical History:  Diagnosis Date  . ADHD (attention deficit hyperactivity disorder)   . Anemia   . Anxiety   . Asthma   . Chronic migraine 08/01/2018  . Depression   . Gonorrhea in female 10/05/2013  . 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[place] 08/01/2018  . Headache   . Obesity   . Seizures ([place]    "when I get too hot"  . Trichomonas infection   . UTI (lower urinary tract infection)    PSHx:  Past Surgical History:  Procedure Laterality Date  . ADENOIDECTOMY    . DILATION AND EVACUATION N/A 08/09/2013   Procedure: DILATATION AND EVACUATION;  Surgeon: BFrederico Hamman MD;  [facility];  Service: Gynecology;  Laterality: N/A;  . ORTHOPEDIC SURGERY Left    left hand  . TONSILLECTOMY     Medications:  Medications Prior to Admission  Medication Sig Dispense Refill Last Dose  . acetaminophen (TYLENOL) 500 MG tablet Take 1,000 mg by mouth every 6 (six) hours as needed for headache.     . albuterol (PROVENTIL HFA;VENTOLIN HFA) 108 (90 Base) MCG/ACT inhaler Inhale 2 puffs into the lungs every 6 (six) hours as needed for wheezing or shortness of breath. 1 Inhaler 0   . aspirin EC 81 MG tablet Take 1 tablet (81 mg total) by mouth daily. Take after  12 weeks for prevention of preeclampsia later in pregnancy 300 tablet 2   . Blood Pressure KIT Monitor BP readings at home regularly O09.90 extra large 1 kit 0   . cyclobenzaprine (FLEXERIL) 10 MG tablet Take 1 tablet (10 mg total) by mouth 3 (three) times daily as needed for muscle spasms (back pain). 30 tablet 0   . docusate sodium (COLACE) 100 MG capsule Take 1 capsule (100 mg total) by mouth 2 (two) times daily. 60 capsule 2   . Doxylamine-Pyridoxine (DICLEGIS) 10-10 MG TBEC Take 2 tablets by mouth at bedtime. If symptoms persist, add one tablet in the morning and one in the afternoon 100 tablet 5   . Elastic Bandages & Supports (COMFORT FIT MATERNITY SUPP LG) MISC 1 application by Does not apply route daily. 1 each 0   . folic acid (FOLVITE) 1 MG tablet Take 1 tablet (1 mg total) by mouth daily. 30 tablet 10   . levETIRAcetam (KEPPRA) 750 MG tablet Take 2 tablets (1,500 mg total) by mouth 2 (two) times daily. One po bid 120 tablet 2   . metoCLOPramide (REGLAN) 5 MG tablet Take 1 tablet (5 mg total) by mouth 3 (three) times daily. 90 tablet 1   . ondansetron (ZOFRAN ODT) 4 MG disintegrating tablet Take 1 tablet (4 mg total) by mouth every 8 (eight)  hours as needed for nausea or vomiting. 20 tablet 0   . pantoprazole (PROTONIX) 40 MG tablet Take 1 tablet (40 mg total) by mouth daily. 30 tablet 1   . polyethylene glycol (MIRALAX / GLYCOLAX) 17 g packet Take 17 g by mouth 2 (two) times daily. 24 each 3   . Prenat w/o A-FeCbn-Meth-FA-DHA (PRENATE MINI) 29-0.6-0.4-350 MG CAPS Take 1 capsule by mouth daily before breakfast. 30 capsule 11   . senna (SENOKOT) 8.6 MG TABS tablet Take 1 tablet (8.6 mg total) by mouth daily. 120 tablet 0   . terconazole (TERAZOL 7) 0.4 % vaginal cream Place 1 applicator vaginally at bedtime. Use for seven days 45 g 0      Allergies: is allergic to milk-related compounds; mushroom extract complex; penicillins; cheese; latex; sulfa antibiotics; and vicodin  [hydrocodone-acetaminophen]. OBHx:  OB History  Gravida Para Term Preterm AB Living  4       3    SAB TAB Ectopic Multiple Live Births  3            # Outcome Date GA Lbr Len/2nd Weight Sex Delivery Anes PTL Lv  4 Current           3 SAB 10/10/14          2 SAB           1 SAB              Birth Comments: System Generated. Please review and update pregnancy details.           FHx:  Family History  Problem Relation Age of Onset  . Seizures Mother   . Stroke Mother   . Hypertension Father   . Diabetes Maternal Grandmother   . Colon cancer Maternal Grandmother   . Hypertension Maternal Grandmother   . Clotting disorder Maternal Grandmother   . Breast cancer Maternal Grandmother   . Stroke Maternal Grandmother   . Diabetes Paternal Grandmother   . Seizures Maternal Aunt    Soc Hx:  Social History   Socioeconomic History  . Marital status: Single    Spouse name: Not on file  . Number of children: 0  . Years of education: Not on file  . Highest education level: Not on file  Occupational History  . Not on file  Social Needs  . Financial resource strain: Not hard at all  . Food insecurity    Worry: Sometimes true    Inability: Sometimes true  . Transportation needs    Medical: No    Non-medical: No  Tobacco Use  . Smoking status: Never Smoker  . Smokeless tobacco: Never Used  Substance and Sexual Activity  . Alcohol use: No    Alcohol/week: 0.0 standard drinks  . Drug use: No  . Sexual activity: Not Currently    Partners: Male    Birth control/protection: None    Comment: one partner  Lifestyle  . Physical activity    Days per week: 0 days    Minutes per session: Not on file  . Stress: Not at all  Relationships  . Social connections    Talks on phone: More than three times a week    Gets together: More than three times a week    Attends religious service: Never    Active member of club or organization: No    Attends meetings of clubs or organizations:  Never    Relationship status: Never married  . Intimate partner violence  Fear of current or ex partner: No    Emotionally abused: No    Physically abused: No    Forced sexual activity: No  Other Topics Concern  . Not on file  Social History Narrative   Caffeine: tea sometimes   Lives with fiance   Right handed     Objective    Current Vital Signs 24h Vital Sign Ranges  T 97.6 F (36.4 C) Temp  Avg: 97.6 F (36.4 C)  Min: 97.6 F (36.4 C)  Max: 97.6 F (36.4 C)  BP 112/69 BP  Min: 105/67  Max: 151/136  HR 98 Pulse  Avg: 112  Min: 98  Max: 127  RR 19 Resp  Avg: 19.3  Min: 19  Max: 20  SaO2 98 % (ra) SpO2  Avg: 97.8 %  Min: 97 %  Max: 98 %       24 Hour I/O Current Shift I/O  Time Ins Outs No intake/output data recorded. No intake/output data recorded.   Category I tracing with accels and no contractions  General: Well nourished, well developed female in no acute distress.  Skin:  Warm and dry.  Cardiovascular: S1, S2 normal, no murmur, rub or gallop, regular rate and rhythm Respiratory:  Clear to auscultation bilateral. Normal respiratory effort Abdomen: obese, soft, sore at the left belly Neuro/Psych:  Normal mood and affect. A&O x 4  Labs   Recent Labs  Lab 11/23/18 1541  WBC 9.6  HGB 11.1*  HCT 33.2*  PLT 289  304    Recent Labs  Lab 11/23/18 1541  NA 136  K 3.7  CL 104  CO2 20*  BUN <5*  CREATININE 0.64  CALCIUM 9.0  PROT 6.6  BILITOT 0.3  ALKPHOS 84  ALT 24  AST 24  GLUCOSE 118*   Results for MYRKA, SYLVA (MRN [id number]) as of 11/23/2018 17:59  Ref. Range 11/23/2018 15:41  D-Dimer, Quant Latest Ref Range: 0.00 - 0.50 ug/mL-FEU 1.08 (H)  Fibrinogen Latest Ref Range: 210 - 475 mg/dL 486 (H)  Prothrombin Time Latest Ref Range: 11.4 - 15.2 seconds 14.8  INR Latest Ref Range: 0.8 - 1.2  1.2  APTT Latest Ref Range: 24 - 36 seconds 25   Lipase normal  Conflict (See Lab Report): AB POS/AB POS Performed at [facility] [address], [place], [place] [address]  Radiology none  Assessment & Plan   23 y.o. [phone] @ 71w1dwith likely worsened seizure d/o *Pregnancy: fetal status reassuring. qshift NSTs *Seizure: with EMS she had mild range BPs and her initial one here was also mild range but they have then been normal (?wrong cuff size). Her PC ratio is pending but her other labs are normal and she recently went up on the keppra due to seizure earlier this month. On initial presentation, patient given bmz and start on Mg. D/w Neuro and they agree that likely not pre-eclampsia and can d/c Mg and watch overnight. If seizes then they will do formal consult but he recommends increasing to 2gm po bid and call the office during business hours *Preterm: give 2nd bmz tomorrow at 1645 tomorrow *Analgesia: no needs *FEN/GI: MIVF, NPO for now *PPx: SCDs *Dispo: obs for 24 hours.   CDurene RomansMD [facility]ending [facility] GYN Consult Phone: [phone](M-F, 0800-1700) & [phone](Off hours, weekends, holidays)

## 2018-11-23 NOTE — Progress Notes (Signed)
Neurologist at bedside.   Vital signs within normal.  Patient up to bathroom with assistance and tolerated well. Off fetal monitoring as instructed by Dr. Ilda Basset.

## 2018-11-23 NOTE — Progress Notes (Signed)
OB Note CTSP re: new onset seizure. Patient was about to get her PO keppra 2000mg  dose and then endorsed a severe HA to the nurse and then started seizing for about 2-3 minutes. It stopped momentarily and then she had another but shorter one for about 30-60 seconds. When I arrived, patient had stopped seizing and was post ictal and starting to be arousable. VS were always normal and stable. Fetal heart rate normal after seizure.   Will leave on continuous fetal monitoring, cycle VS and hold off on PO keppra until I hear back from neuro, in case they want to do something different now that she's had another seizure.  Durene Romans MD Attending [facility]) 11/23/2018 Time: [phone]

## 2018-11-23 NOTE — Consult Note (Signed)
Neurology Consultation Reason for Consult: Seizures Referring Physician: Ilda Basset, C  CC: Seizures  History is obtained from: Patient, husband  HPI: Latoya Stark is a 23 y.o. female with a history of seizures diagnosed at age 72 who is on Dilantin for most of her life until recently changed due to pregnancy.  While taking Dilantin, she would regularly have 2-3 seizures per month.  She previously saw Dr. Gaynell Face when younger.  Due to her pregnancy, she was switched to Keppra and was taking 750 3 times daily.  When she switched to [place], her seizure control improved considerably.  She did have a breakthrough seizure on November 2.  She saw her outpatient neurologist who increased her to 1500 twice daily, however she had not actually done this as she was finishing out her 750 3 times daily prior to making the change.  She presented tonight with breakthrough seizure, and her dose was increased, however just prior to having her first dose of 2000 mg, she had a breakthrough seizure.    ROS: A 14 point ROS was performed and is negative except as noted in the HPI.   Past Medical History:  Diagnosis Date  . ADHD (attention deficit hyperactivity disorder)   . Anemia   . Anxiety   . Asthma   . Chronic migraine 08/01/2018  . Depression   . Gonorrhea in female 10/05/2013  . 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 Headache   . Obesity   . Seizures ([place])    "when I get too hot"  . Trichomonas infection   . UTI (lower urinary tract infection)      Family History  Problem Relation Age of Onset  . Seizures Mother   . Stroke Mother   . Hypertension Father   . Diabetes Maternal Grandmother   . Colon cancer Maternal Grandmother   . Hypertension Maternal Grandmother   . Clotting disorder Maternal Grandmother   . Breast cancer Maternal Grandmother   . Stroke Maternal Grandmother   . Diabetes Paternal Grandmother   . Seizures Maternal Aunt      Social History:  reports that she  has never smoked. She has never used smokeless tobacco. She reports that she does not drink alcohol or use drugs.   Exam: Current vital signs: BP 134/81 (BP Location: Right Arm)   Pulse (!) 114   Temp 99.1 F (37.3 C) (Oral)   Resp 20   Ht 5\' 4"  (1.626 m)   Wt (!) 139 kg   LMP 05/31/2018 (Exact Date)   SpO2 99%   BMI 52.61 kg/m  Vital signs in last 24 hours: Temp:  [97.6 F (36.4 C)-99.1 F (37.3 C)] 99.1 F (37.3 C) (11/14 2103) Pulse Rate:  [98-127] 114 (11/14 2103) Resp:  [19-21] 20 (11/14 2103) BP: (105-151)/(56-136) 134/81 (11/14 2103) SpO2:  [97 %-100 %] 99 % (11/14 2103) Weight:  [id number] kg] 139 kg (11/14 1837)   Physical Exam  Constitutional: Appears well-developed and well-nourished.  Psych: Affect appropriate to situation Eyes: No scleral injection HENT: No OP obstrucion MSK: no joint deformities.  Cardiovascular: Normal rate and regular rhythm.  Respiratory: Effort normal, non-labored breathing GI: Soft.  No distension. There is no tenderness.  Skin: WDI  Neuro: Mental Status: Patient is awake, alert, oriented to person, place, month, year, and situation. Patient is able to give a clear and coherent history. No signs of aphasia or neglect Cranial Nerves: II: Visual Fields are full. Pupils are equal, round, and reactive to  light.   III,IV, VI: EOMI without ptosis or diploplia.  V: Facial sensation is symmetric to temperature VII: Facial movement is symmetric.  VIII: hearing is intact to voice X: Uvula elevates symmetrically XI: Shoulder shrug is symmetric. XII: tongue is midline without atrophy or fasciculations.  Motor: Tone is normal. Bulk is normal. 5/5 strength was present in all four extremities.  Sensory: Sensation is symmetric to light touch and temperature in the arms and legs. Cerebellar: FNF intact bilaterally    I have reviewed labs in epic and the results pertinent to this consultation are: CMP-unremarkable  I have reviewed the  images obtained: CT head from June 2017, unremarkable  Impression: 23 year old female with breakthrough seizures.  Given that she had improved control after switching from Dilantin to Keppra, I suspect that her increasing breakthroughs are due to increased metabolism of Keppra.  She had not yet increased her Keppra, so though an increased dose of 2 g was discussed with her, I think that trying 1500 twice daily would be reasonable.  She may need further titration of this through her pregnancy.  Recommendations: 1) Keppra 2 g tonight, then 1500 twice daily 2) follow-up with outpatient neurologist.   Roland Rack, MD [facility] [phone]  If 7pm- 7am, please page neurology on call as listed in [place].

## 2018-11-24 MED ORDER — LEVETIRACETAM 750 MG PO TABS: 1500.0000 mg | ORAL_TABLET | Freq: Two times a day (BID) | ORAL | 2 refills | Status: DC

## 2018-11-24 NOTE — Progress Notes (Signed)
Received verbal order from Dr. Roselie Awkward that pt can receive BMZ at 12 pm this afternoon.

## 2018-11-24 NOTE — Progress Notes (Signed)
OB Note Patient sleeping. Will come back later to round  Durene Romans MD Attending [facility]) 11/24/2018 Time: [phone]

## 2018-11-24 NOTE — Progress Notes (Signed)
Discharge instructions and prescriptions given to pt. Discussed signs and symptoms to report to the MD, upcoming appointments, and medications. Pt verbalizes understanding and has no questions or concerns at this time. Pt discharged in stable condition.

## 2018-11-24 NOTE — Discharge Summary (Signed)
Discharge Summary   Admit Date: 11/23/2018 Discharge Date: 11/24/2018 Discharging Service: Antepartum  Primary OBGYN: Latoya Stark Admitting Physician: Latoya Halim, MD  Discharge Physician: Latoya Stark  Referring Provider: Maternity Admissions Stark  Primary Care Provider: Patient, No Pcp Stark  Admission Diagnoses: *Pregnancy at 25/1 *?Hypertension and eclamptic seizures *Seizure in the setting of known seizure disorder *BMI 50   Discharge Diagnoses: *Pregnancy at 25/2 *History of seizures in setting of know seizure disorder  Consult Orders: None   Surgeries/Procedures Performed: None  History and Physical: Obstetrics Admission History & Physical  11/23/2018 - 5:56 PM Primary OBGYN: Latoya Stark  Chief Complaint: seizure at 1400 today  History of Present Illness  23 y.o. [name] @ [redacted]w[redacted]d with the above CC. Pregnancy complicated by: long standing seizure d/o. BMI 40s, asthma.  Ms. Latoya MANVILLEstates she went to sleep this afternoon and woke up seizing. Her partner was also sleeping and saw her having a seizure. She states that she feel asleep on the bed and woke up off it and her left belly feels sore only. No contractions, leakage of fluid, decreased FM or chest pain, sob, head soreness. They confirm that they do take their keppra (see neuro notes, most recently from 11/9)  Review of Systems:  as noted in the History of Present Illness.  PMHx:      Past Medical History:  Diagnosis Date  . ADHD (attention deficit hyperactivity disorder)   . Anemia   . Anxiety   . Asthma   . Chronic migraine 08/01/2018  . Depression   . Gonorrhea in female 10/05/2013  . 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 Headache   . Obesity   . Seizures ([facility]    "when I get too hot"  . Trichomonas infection   . UTI (lower urinary tract infection)    PSHx:       Past Surgical History:  Procedure Laterality Date  . ADENOIDECTOMY    . DILATION AND EVACUATION N/A  08/09/2013   Procedure: DILATATION AND EVACUATION;  Surgeon: BFrederico Hamman MD;  [facility];  Service: Gynecology;  Laterality: N/A;  . ORTHOPEDIC SURGERY Left    left hand  . TONSILLECTOMY     Medications:         Medications Prior to Admission  Medication Sig Dispense Refill Last Dose  . acetaminophen (TYLENOL) 500 MG tablet Take 1,000 mg by mouth every 6 (six) hours as needed for headache.     . albuterol (PROVENTIL HFA;VENTOLIN HFA) 108 (90 Base) MCG/ACT inhaler Inhale 2 puffs into the lungs every 6 (six) hours as needed for wheezing or shortness of breath. 1 Inhaler 0   . aspirin EC 81 MG tablet Take 1 tablet (81 mg total) by mouth daily. Take after 12 weeks for prevention of preeclampsia later in pregnancy 300 tablet 2   . Blood Pressure KIT Monitor BP readings at home regularly O09.90 extra large 1 kit 0   . cyclobenzaprine (FLEXERIL) 10 MG tablet Take 1 tablet (10 mg total) by mouth 3 (three) times daily as needed for muscle spasms (back pain). 30 tablet 0   . docusate sodium (COLACE) 100 MG capsule Take 1 capsule (100 mg total) by mouth 2 (two) times daily. 60 capsule 2   . Doxylamine-Pyridoxine (DICLEGIS) 10-10 MG TBEC Take 2 tablets by mouth at bedtime. If symptoms persist, add one tablet in the morning and one in the afternoon 100 tablet 5   . Elastic Bandages & Supports (COMFORT FIT Latoya  SUPP LG) MISC 1 application by Does not apply route daily. 1 each 0   . folic acid (FOLVITE) 1 MG tablet Take 1 tablet (1 mg total) by mouth daily. 30 tablet 10   . levETIRAcetam (KEPPRA) 750 MG tablet Take 2 tablets (1,500 mg total) by mouth 2 (two) times daily. One po bid 120 tablet 2   . metoCLOPramide (REGLAN) 5 MG tablet Take 1 tablet (5 mg total) by mouth 3 (three) times daily. 90 tablet 1   . ondansetron (ZOFRAN ODT) 4 MG disintegrating tablet Take 1 tablet (4 mg total) by mouth every 8 (eight) hours as needed for nausea or vomiting. 20 tablet 0   .  pantoprazole (PROTONIX) 40 MG tablet Take 1 tablet (40 mg total) by mouth daily. 30 tablet 1   . polyethylene glycol (MIRALAX / GLYCOLAX) 17 g packet Take 17 g by mouth 2 (two) times daily. 24 each 3   . Prenat w/o A-FeCbn-Meth-FA-DHA (PRENATE MINI) 29-0.6-0.4-350 MG CAPS Take 1 capsule by mouth daily before breakfast. 30 capsule 11   . senna (SENOKOT) 8.6 MG TABS tablet Take 1 tablet (8.6 mg total) by mouth daily. 120 tablet 0   . terconazole (TERAZOL 7) 0.4 % vaginal cream Place 1 applicator vaginally at bedtime. Use for seven days 45 g 0      Allergies: is allergic to milk-related compounds; mushroom extract complex; penicillins; cheese; latex; sulfa antibiotics; and vicodin [hydrocodone-acetaminophen]. OBHx:                  OB History  Gravida Para Term Preterm AB Living  4       3    SAB TAB Ectopic Multiple Live Births     3               # Outcome Date GA Lbr Len/2nd Weight Sex Delivery Anes PTL Lv  4 Current           3 SAB 10/10/14          2 SAB           1 SAB              Birth Comments: System Generated. Please review and update pregnancy details.           FHx:       Family History  Problem Relation Age of Onset  . Seizures Mother   . Stroke Mother   . Hypertension Father   . Diabetes Maternal Grandmother   . Colon cancer Maternal Grandmother   . Hypertension Maternal Grandmother   . Clotting disorder Maternal Grandmother   . Breast cancer Maternal Grandmother   . Stroke Maternal Grandmother   . Diabetes Paternal Grandmother   . Seizures Maternal Aunt    Soc Hx:  Social History        Socioeconomic History  . Marital status: Single    Spouse name: Not on file  . Number of children: 0  . Years of education: Not on file  . Highest education level: Not on file  Occupational History  . Not on file  Social Needs  . Financial resource strain: Not hard at all  . Food insecurity    Worry:  Sometimes true    Inability: Sometimes true  . Transportation needs    Medical: No    Non-medical: No  Tobacco Use  . Smoking status: Never Smoker  . Smokeless tobacco: Never Used  Substance and Sexual Activity  .  Alcohol use: No    Alcohol/week: 0.0 standard drinks  . Drug use: No  . Sexual activity: Not Currently    Partners: Male    Birth control/protection: None    Comment: one partner  Lifestyle  . Physical activity    Days Stark week: 0 days    Minutes Stark session: Not on file  . Stress: Not at all  Relationships  . Social connections    Talks on phone: More than three times a week    Gets together: More than three times a week    Attends religious service: Never    Active member of club or organization: No    Attends meetings of clubs or organizations: Never    Relationship status: Never married  . Intimate partner violence    Fear of current or ex partner: No    Emotionally abused: No    Physically abused: No    Forced sexual activity: No  Other Topics Concern  . Not on file  Social History Narrative   Caffeine: tea sometimes   Lives with fiance   Right handed     Objective    Current Vital Signs 24h Vital Sign Ranges  T 97.6 F (36.4 C) Temp  Avg: 97.6 F (36.4 C)  Min: 97.6 F (36.4 C)  Max: 97.6 F (36.4 C)  BP 112/69 BP  Min: 105/67  Max: 151/136  HR 98 Pulse  Avg: 112  Min: 98  Max: 127  RR 19 Resp  Avg: 19.3  Min: 19  Max: 20  SaO2 98 % (ra) SpO2  Avg: 97.8 %  Min: 97 %  Max: 98 %       24 Hour I/O Current Shift I/O  Time Ins Outs No intake/output data recorded. No intake/output data recorded.   Category I tracing with accels and no contractions  General: Well nourished, well developed female in no acute distress.  Skin:  Warm and dry.  Cardiovascular: S1, S2 normal, no murmur, rub or gallop, regular rate and rhythm Respiratory:  Clear to auscultation bilateral. Normal respiratory  effort Abdomen: obese, soft, sore at the left belly Neuro/Psych:  Normal mood and affect. A&O x 4  Labs   Last Labs      Recent Labs  Lab 11/23/18 1541  WBC 9.6  HGB 11.1*  HCT 33.2*  PLT 289  304      Last Labs      Recent Labs  Lab 11/23/18 1541  NA 136  K 3.7  CL 104  CO2 20*  BUN <5*  CREATININE 0.64  CALCIUM 9.0  PROT 6.6  BILITOT 0.3  ALKPHOS 84  ALT 24  AST 24  GLUCOSE 118*     Results for Latoya Stark, Latoya Stark (MRN [id number]) as of 11/23/2018 17:59  Ref. Range 11/23/2018 15:41  D-Dimer, Quant Latest Ref Range: 0.00 - 0.50 ug/mL-FEU 1.08 (H)  Fibrinogen Latest Ref Range: 210 - 475 mg/dL 486 (H)  Prothrombin Time Latest Ref Range: 11.4 - 15.2 seconds 14.8  INR Latest Ref Range: 0.8 - 1.2  1.2  APTT Latest Ref Range: 24 - 36 seconds 25   Lipase normal  Conflict (See Lab Report): AB POS/AB POS Performed at [facility] [address], [place], [place] [address]  Radiology none  Assessment & Plan   23 y.o. [phone] @ 74w1dwith likely worsened seizure d/o *Pregnancy: fetal status reassuring. qshift NSTs *Seizure: with EMS she had mild range BPs and her initial  one here was also mild range but they have then been normal (?wrong cuff size). Her PC ratio is pending but her other labs are normal and she recently went up on the keppra due to seizure earlier this month. On initial presentation, patient given bmz and start on Mg. D/w Neuro and they agree that likely not pre-eclampsia and can d/c Mg and watch overnight. If seizes then they will do formal consult but he recommends increasing to 2gm po bid and call the office during business hours *Preterm: give 2nd bmz tomorrow at 1645 tomorrow *Analgesia: no needs *FEN/GI: MIVF, NPO for now *PPx: SCDs *Dispo: obs for 24 hours.   Durene Romans MD Attending Center for [facility] GYN Consult Phone: [phone] (M-F, 0800-1700) & [phone] (Off hours, weekends,  holidays)          Electronically signed by Latoya Halim, MD at 11/23/2018 7:59 PM   Hospital Course: *Pregnancy: patient had prolonged fetal monitoring and always had reassuring fetal heart tones *Seizures: in EMS and her initial BP on arrival was mild range; while a work up was being done she was started on Mg and given betamethasone. Her pre-eclampsia labs were negative and her BPs were always normal after that so the Mg was stopped and she was diagnosed with non eclamptic seizure. She had another seizure just prior to getting her higher dosed PO keppra, and off the Mg. [facility] neurology, they recommend giving her a dose of IV ativan and the PO keppra in the interim before they see her.  They recommend continuing on the higher doses PO keppra recommended at her outpatient visit with [facility] (patient had increased it yet) and to follow up with them as an outpatient.  *Preterm: she was given BMZ course on 11/14-15   Discharge Exam:   Current Vital Signs 24h Vital Sign Ranges  T 98.2 F (36.8 C) Temp  Avg: 98.2 F (36.8 C)  Min: 97.6 F (36.4 C)  Max: 99.1 F (37.3 C)  BP (!) 109/58 BP  Min: 105/67  Max: 151/136  HR (!) 106 Pulse  Avg: 109.8  Min: 98  Max: 127  RR 19 Resp  Avg: 19.5  Min: 19  Max: 21  SaO2 95 % Room Air SpO2  Avg: 98.2 %  Min: 95 %  Max: 100 %       24 Hour I/O Current Shift I/O  Time Ins Outs 11/14 0701 - 11/15 0700 In: 120 [P.O.:120] Out: 750 [Urine:750] No intake/output data recorded.    Patient Vitals for the past 24 hrs:  BP Temp Temp src Pulse Resp SpO2 Height Weight  11/24/18 0809 (!) 109/58 98.2 F (36.8 C) Oral (!) 106 19 95 % - -  11/24/18 0356 (!) 116/54 98.3 F (36.8 C) Oral (!) 105 19 96 % - -  11/23/18 2359 111/71 98.2 F (36.8 C) Oral (!) 116 19 97 % - -  11/23/18 2250 - - - - - 97 % - -  11/23/18 2245 - - - - - 97 % - -  11/23/18 2240 - - - - - 96 % - -  11/23/18 2235 - - - - - 96 % - -  11/23/18 2230 - - - - - 98 % - -   11/23/18 2220 - - - - - 97 % - -  11/23/18 2215 - - - - - 98 % - -  11/23/18 2210 - - - - - 98 % - -  11/23/18 2205 - - - - - 98 % - -  11/23/18 2200 - - - - - 97 % - -  11/23/18 2155 - - - - - 98 % - -  11/23/18 2150 - - - - - 98 % - -  11/23/18 2145 - - - - - 98 % - -  11/23/18 2140 - - - - - 97 % - -  11/23/18 2135 - - - - - 98 % - -  11/23/18 2130 - - - - - 97 % - -  11/23/18 2110 - - - - - 97 % - -  11/23/18 2103 134/81 99.1 F (37.3 C) Oral (!) 114 20 99 % - -  11/23/18 2100 - - - - - 100 % - -  11/23/18 2055 - - - - - 100 % - -  11/23/18 2050 - - - - - 100 % - -  11/23/18 2045 - - - - - 100 % - -  11/23/18 2040 - - - - - 100 % - -  11/23/18 2035 - - - - - 100 % - -  11/23/18 2030 - - - - - 100 % - -  11/23/18 2025 - - - - - 100 % - -  11/23/18 2020 - - - - - 100 % - -  11/23/18 2015 - - - - - 100 % - -  11/23/18 2013 (!) 123/57 - - (!) 109 - - - -  11/23/18 2010 - - - - - 99 % - -  11/23/18 2005 - - - - - 100 % - -  11/23/18 2000 - - - - - 100 % - -  11/23/18 1959 (!) 130/98 - - (!) 109 (!) 21 100 % - -  11/23/18 1955 - - - - - 100 % - -  11/23/18 1945 - - - - - 98 % - -  11/23/18 1944 112/65 98.1 F (36.7 C) Oral (!) 103 20 99 % - -  11/23/18 1837 (!) 123/56 98.2 F (36.8 C) Oral 98 20 97 % '5\' 4"'  (1.626 m) (!) 139 kg  11/23/18 1810 - - - - - 98 % - -  11/23/18 1805 - - - - - 97 % - -  11/23/18 1800 - - - - - 98 % - -  11/23/18 1755 - - - - - 97 % - -  11/23/18 1750 - - - - - 99 % - -  11/23/18 1745 - - - - - 99 % - -  11/23/18 1740 112/69 - - 98 19 98 % - -  11/23/18 1735 - - - - - 99 % - -  11/23/18 1730 - - - - - 98 % - -  11/23/18 1725 - - - - - 98 % - -  11/23/18 1720 - - - - - 97 % - -  11/23/18 1715 - - - - - 98 % - -  11/23/18 1710 - - - - - 98 % - -  11/23/18 1705 - - - - - 99 % - -  11/23/18 1700 115/71 97.6 F (36.4 C) Oral (!) 111 19 98 % - -  11/23/18 1655 - - - - - 99 % - -  11/23/18 1650 - - - - - 98 % - -  11/23/18 1645 - - - - - 98 % - -   11/23/18 1644 119/67 - - (!) 103 19 98 % - -  11/23/18 1640 - - - - - 98 % - -  11/23/18 1635 - - - - - 97 % - -  11/23/18 1634 108/71 - - (!) 108 19 - - -  11/23/18 1630 - - - - - 98 % - -  11/23/18 1625 - - - - - 98 % - -  11/23/18 1623 106/74 - - (!) 107 19 98 % - -  11/23/18 1620 - - - - - 98 % - -  11/23/18 1615 - - - - - 98 % - -  11/23/18 1610 - - - - - 97 % - -  11/23/18 1605 - - - - - 98 % - -  11/23/18 1600 105/67 - - (!) 116 20 98 % - -  11/23/18 1555 - - - - - 98 % - -  11/23/18 1550 - - - - - 97 % - -  11/23/18 1545 - - - - - 98 % - -  11/23/18 1540 132/79 - Oral (!) 126 20 97 % - -    General appearance: Well nourished, well developed female in no acute distress. Cardiovascular: S1, S2 normal, no murmur, rub or gallop, regular rate and rhythm Respiratory:  Clear to auscultation bilateral. Normal respiratory effort Abdomen: gravid, nttp, obese Neuro/Psych:  Normal mood and affect.  Skin:  Warm and dry.   Discharge Disposition:  Home  Patient Instructions:  Standard   Results Pending at Discharge:  None  Discharge Medications: Allergies as of 11/24/2018      Reactions   Milk-related Compounds Anaphylaxis   Breaks face out *can have 2% milk*   Mushroom Extract Complex Anaphylaxis   Penicillins Anaphylaxis   Has patient had a PCN reaction causing immediate rash, facial/tongue/throat swelling, SOB or lightheadedness with hypotension: Yes Has patient had a PCN reaction causing severe rash involving mucus membranes or skin necrosis: No Has patient had a PCN reaction that required hospitalization No Has patient had a PCN reaction occurring within the last 10 years: Yes If all of the above answers are "NO", then may proceed with Cephalosporin use.   Cheese Other (See Comments)   Breaks face out   Latex Itching, Swelling   Sulfa Antibiotics Swelling   Vicodin [hydrocodone-acetaminophen] Hives      Medication List    TAKE these medications   acetaminophen  500 MG tablet Commonly known as: TYLENOL Take 1,000 mg by mouth every 6 (six) hours as needed for headache.   albuterol 108 (90 Base) MCG/ACT inhaler Commonly known as: VENTOLIN HFA Inhale 2 puffs into the lungs every 6 (six) hours as needed for wheezing or shortness of breath.   aspirin EC 81 MG tablet Take 1 tablet (81 mg total) by mouth daily. Take after 12 weeks for prevention of preeclampsia later in pregnancy   Blood Pressure Kit Monitor BP readings at home regularly O09.90 extra large   [place] 1 application by Does not apply route daily.   cyclobenzaprine 10 MG tablet Commonly known as: FLEXERIL Take 1 tablet (10 mg total) by mouth 3 (three) times daily as needed for muscle spasms (back pain).   docusate sodium 100 MG capsule Commonly known as: Colace Take 1 capsule (100 mg total) by mouth 2 (two) times daily.   Doxylamine-Pyridoxine 10-10 MG Tbec Commonly known as: Diclegis Take 2 tablets by mouth at bedtime. If symptoms persist, add one tablet in the morning and one in the afternoon   folic acid  1 MG tablet Commonly known as: FOLVITE Take 1 tablet (1 mg total) by mouth daily.   levETIRAcetam 750 MG tablet Commonly known as: KEPPRA Take 2 tablets (1,500 mg total) by mouth 2 (two) times daily. What changed: additional instructions   metoCLOPramide 5 MG tablet Commonly known as: Reglan Take 1 tablet (5 mg total) by mouth 3 (three) times daily.   ondansetron 4 MG disintegrating tablet Commonly known as: Zofran ODT Take 1 tablet (4 mg total) by mouth every 8 (eight) hours as needed for nausea or vomiting.   pantoprazole 40 MG tablet Commonly known as: Protonix Take 1 tablet (40 mg total) by mouth daily.   polyethylene glycol 17 g packet Commonly known as: MIRALAX / GLYCOLAX Take 17 g by mouth 2 (two) times daily.   Prenate Mini 29-0.6-0.4-350 MG Caps Take 1 capsule by mouth daily before breakfast.   senna 8.6 MG Tabs  tablet Commonly known as: SENOKOT Take 1 tablet (8.6 mg total) by mouth daily.   terconazole 0.4 % vaginal cream Commonly known as: TERAZOL 7 Place 1 applicator vaginally at bedtime. Use for seven days       Future Appointments  Date Time Provider [place]  11/29/2018  9:15 AM Chancy Milroy, MD [place] None  12/10/2018 10:45 AM [facility] NURSE [facility]  12/10/2018 10:45 AM [facility] [place] 2 [facility]  02/19/2019  9:30 AM Lomax, Amy, NP [facility] None    Durene Romans MD Attending Center for [place])

## 2018-11-24 NOTE — Discharge Instructions (Addendum)
Levetiracetam tablets What is this medicine? LEVETIRACETAM (lee ve tye RA se tam) is an antiepileptic drug. It is used with other medicines to treat certain types of seizures. This medicine may be used for other purposes; ask your health care provider or pharmacist if you have questions. COMMON BRAND NAME(S): Keppra, Roweepra What should I tell my health care provider before I take this medicine? They need to know if you have any of these conditions:  kidney disease  suicidal thoughts, plans, or attempt; a previous suicide attempt by you or a family member  an unusual or allergic reaction to levetiracetam, other medicines, foods, dyes, or preservatives  pregnant or trying to get pregnant  breast-feeding How should I use this medicine? Take this medicine by mouth with a glass of water. Follow the directions on the prescription label. Swallow the tablets whole. Do not crush or chew this medicine. You may take this medicine with or without food. Take your doses at regular intervals. Do not take your medicine more often than directed. Do not stop taking this medicine or any of your seizure medicines unless instructed by your doctor or health care professional. Stopping your medicine suddenly can increase your seizures or their severity. A special MedGuide will be given to you by the pharmacist with each prescription and refill. Be sure to read this information carefully each time. Contact your pediatrician or health care professional regarding the use of this medication in children. While this drug may be prescribed for children as young as 41 years of age for selected conditions, precautions do apply. Overdosage: If you think you have taken too much of this medicine contact a poison control center or emergency room at once. NOTE: This medicine is only for you. Do not share this medicine with others. What if I miss a dose? If you miss a dose, take it as soon as you can. If it is almost time for  your next dose, take only that dose. Do not take double or extra doses. What may interact with this medicine? This medicine may interact with the following medications:  carbamazepine  colesevelam  probenecid  sevelamer This list may not describe all possible interactions. Give your health care provider a list of all the medicines, herbs, non-prescription drugs, or dietary supplements you use. Also tell them if you smoke, drink alcohol, or use illegal drugs. Some items may interact with your medicine. What should I watch for while using this medicine? Visit your doctor or health care provider for a regular check on your progress. Wear a medical identification bracelet or chain to say you have epilepsy, and carry a card that lists all your medications. This medicine may cause serious skin reactions. They can happen weeks to months after starting the medicine. Contact your health care provider right away if you notice fevers or flu-like symptoms with a rash. The rash may be red or purple and then turn into blisters or peeling of the skin. Or, you might notice a red rash with swelling of the face, lips or lymph nodes in your neck or under your arms. It is important to take this medicine exactly as instructed by your health care provider. When first starting treatment, your dose may need to be adjusted. It may take weeks or months before your dose is stable. You should contact your doctor or health care provider if your seizures get worse or if you have any new types of seizures. You may get drowsy or dizzy. Do not drive,  use machinery, or do anything that needs mental alertness until you know how this medicine affects you. Do not stand or sit up quickly, especially if you are an older patient. This reduces the risk of dizzy or fainting spells. Alcohol may interfere with the effect of this medicine. Avoid alcoholic drinks. The use of this medicine may increase the chance of suicidal thoughts or actions.  Pay special attention to how you are responding while on this medicine. Any worsening of mood, or thoughts of suicide or dying should be reported to your health care provider right away. Women who become pregnant while using this medicine may enroll in the [place] Pregnancy Registry by calling [phone]. This registry collects information about the safety of antiepileptic drug use during pregnancy. What side effects may I notice from receiving this medicine? Side effects that you should report to your doctor or health care professional as soon as possible:  allergic reactions like skin rash, itching or hives, swelling of the face, lips, or tongue  breathing problems  dark urine  general ill feeling or flu-like symptoms  problems with balance, talking, walking  rash, fever, and swollen lymph nodes  redness, blistering, peeling or loosening of the skin, including inside the mouth  unusually weak or tired  worsening of mood, thoughts or actions of suicide or dying  yellowing of the eyes or skin Side effects that usually do not require medical attention (report to your doctor or health care professional if they continue or are bothersome):  diarrhea  dizzy, drowsy  headache  loss of appetite This list may not describe all possible side effects. Call your doctor for medical advice about side effects. You may report side effects to FDA at 1-800-FDA-1088. Where should I keep my medicine? Keep out of reach of children. Store at room temperature between 15 and 30 degrees C (59 and 86 degrees F). Throw away any unused medicine after the expiration date. NOTE: This sheet is a summary. It may not cover all possible information. If you have questions about this medicine, talk to your doctor, pharmacist, or health care provider.  2020 Elsevier/Gold Standard (2018-03-29 15:23:36) Seizure, Adult A seizure is a sudden burst of abnormal electrical activity in the  brain. Seizures usually last from 30 seconds to 2 minutes. They can cause many different symptoms. Usually, seizures are not harmful unless they last a long time. What are the causes? Common causes of this condition include:  Fever or infection.  Conditions that affect the brain, such as: ? A brain abnormality that you were born with. ? A brain or head injury. ? Bleeding in the brain. ? A tumor. ? Stroke. ? Brain disorders such as autism or cerebral palsy.  Low blood sugar.  Conditions that are passed from parent to child (are inherited).  Problems with substances, such as: ? Having a reaction to a drug or a medicine. ? Suddenly stopping the use of a substance (withdrawal). In some cases, the cause may not be known. A person who has repeated seizures over time without a clear cause has a condition called epilepsy. What increases the risk? You are more likely to get this condition if you have:  A family history of epilepsy.  Had a seizure in the past.  A brain disorder.  A history of head injury, lack of oxygen at birth, or strokes. What are the signs or symptoms? There are many types of seizures. The symptoms vary depending on the type of seizure  you have. Examples of symptoms during a seizure include:  Shaking (convulsions).  Stiffness in the body.  Passing out (losing consciousness).  Head nodding.  Staring.  Not responding to sound or touch.  Loss of bladder control and bowel control. Some people have symptoms right before and right after a seizure happens. Symptoms before a seizure may include:  Fear.  Worry (anxiety).  Feeling like you may vomit (nauseous).  Feeling like the room is spinning (vertigo).  Feeling like you saw or heard something before (dj vu).  Odd tastes or smells.  Changes in how you see. You may see flashing lights or spots. Symptoms after a seizure happens can include:  Confusion.  Sleepiness.  Headache.  Weakness on  one side of the body. How is this treated? Most seizures will stop on their own in under 5 minutes. In these cases, no treatment is needed. Seizures that last longer than 5 minutes will usually need treatment. Treatment can include:  Medicines given through an IV tube.  Avoiding things that are known to cause your seizures. These can include medicines that you take for another condition.  Medicines to treat epilepsy.  Surgery to stop the seizures. This may be needed if medicines do not help. Follow these instructions at home: Medicines  Take over-the-counter and prescription medicines only as told by your doctor.  Do not eat or drink anything that may keep your medicine from working, such as alcohol. Activity  Do not do any activities that would be dangerous if you had another seizure, like driving or swimming. Wait until your doctor says it is safe for you to do them.  If you live in the [place] ask your local DMV (department of motor vehicles) when you can drive.  Get plenty of rest. Teaching others Teach friends and family what to do when you have a seizure. They should:  Lay you on the ground.  Protect your head and body.  Loosen any tight clothing around your neck.  Turn you on your side.  Not hold you down.  Not put anything into your mouth.  Know whether or not you need emergency care.  Stay with you until you are better.  General instructions  Contact your doctor each time you have a seizure.  Avoid anything that gives you seizures.  Keep a seizure diary. Write down: ? What you think caused each seizure. ? What you remember about each seizure.  Keep all follow-up visits as told by your doctor. This is important. Contact a doctor if:  You have another seizure.  You have seizures more often.  There is any change in what happens during your seizures.  You keep having seizures with treatment.  You have symptoms of being sick or having an  infection. Get help right away if:  You have a seizure that: ? Lasts longer than 5 minutes. ? Is different than seizures you had before. ? Makes it harder to breathe. ? Happens after you hurt your head.  You have any of these symptoms after a seizure: ? Not being able to speak. ? Not being able to use a part of your body. ? Confusion. ? A bad headache.  You have two or more seizures in a row.  You do not wake up right after a seizure.  You get hurt during a seizure. These symptoms may be an emergency. Do not wait to see if the symptoms will go away. Get medical help right away. Call your local emergency  services (911 in the [place] Do not drive yourself to the hospital. Summary  Seizures usually last from 30 seconds to 2 minutes. Usually, they are not harmful unless they last a long time.  Do not eat or drink anything that may keep your medicine from working, such as alcohol.  Teach friends and family what to do when you have a seizure.  Contact your doctor each time you have a seizure. This information is not intended to replace advice given to you by your health care provider. Make sure you discuss any questions you have with your health care provider. Document Released: 06/14/2007 Document Revised: 03/15/2018 Document Reviewed: 03/15/2018 Elsevier Patient Education  2020 [place].   Fetal Movement Counts Patient Name: ________________________________________________ Patient Due Date: ____________________ What is a fetal movement count?  A fetal movement count is the number of times that you feel your baby move during a certain amount of time. This may also be called a fetal kick count. A fetal movement count is recommended for every pregnant woman. You may be asked to start counting fetal movements as early as week 28 of your pregnancy. Pay attention to when your baby is most active. You may notice your baby's sleep and wake cycles. You may also notice things that make  your baby move more. You should do a fetal movement count:  When your baby is normally most active.  At the same time each day. A good time to count movements is while you are resting, after having something to eat and drink. How do I count fetal movements? 1. Find a quiet, comfortable area. Sit, or lie down on your side. 2. Write down the date, the start time and stop time, and the number of movements that you felt between those two times. Take this information with you to your health care visits. 3. For 2 hours, count kicks, flutters, swishes, rolls, and jabs. You should feel at least 10 movements during 2 hours. 4. You may stop counting after you have felt 10 movements. 5. If you do not feel 10 movements in 2 hours, have something to eat and drink. Then, keep resting and counting for 1 hour. If you feel at least 4 movements during that hour, you may stop counting. Contact a health care provider if:  You feel fewer than 4 movements in 2 hours.  Your baby is not moving like he or she usually does. Date: ____________ Start time: ____________ Stop time: ____________ Movements: ____________ Date: ____________ Start time: ____________ Stop time: ____________ Movements: ____________ Date: ____________ Start time: ____________ Stop time: ____________ Movements: ____________ Date: ____________ Start time: ____________ Stop time: ____________ Movements: ____________ Date: ____________ Start time: ____________ Stop time: ____________ Movements: ____________ Date: ____________ Start time: ____________ Stop time: ____________ Movements: ____________ Date: ____________ Start time: ____________ Stop time: ____________ Movements: ____________ Date: ____________ Start time: ____________ Stop time: ____________ Movements: ____________ Date: ____________ Start time: ____________ Stop time: ____________ Movements: ____________ This information is not intended to replace advice given to you by your health  care provider. Make sure you discuss any questions you have with your health care provider. Document Released: 01/25/2006 Document Revised: 01/15/2018 Document Reviewed: 02/04/2015 Elsevier Patient Education  2020 [employer].

## 2018-11-27 ENCOUNTER — Ambulatory Visit (INDEPENDENT_AMBULATORY_CARE_PROVIDER_SITE_OTHER): Payer: Medicaid Other | Admitting: Obstetrics & Gynecology

## 2018-11-27 ENCOUNTER — Other Ambulatory Visit: Payer: Self-pay

## 2018-11-27 VITALS — BP 130/78 | HR 90 | Wt 305.0 lb

## 2018-11-27 DIAGNOSIS — O99212 Obesity complicating pregnancy, second trimester: Secondary | ICD-10-CM

## 2018-11-27 DIAGNOSIS — G40409 Other generalized epilepsy and epileptic syndromes, not intractable, without status epilepticus: Secondary | ICD-10-CM

## 2018-11-27 DIAGNOSIS — O0990 Supervision of high risk pregnancy, unspecified, unspecified trimester: Secondary | ICD-10-CM

## 2018-11-27 DIAGNOSIS — O99210 Obesity complicating pregnancy, unspecified trimester: Secondary | ICD-10-CM

## 2018-11-27 DIAGNOSIS — O0992 Supervision of high risk pregnancy, unspecified, second trimester: Secondary | ICD-10-CM

## 2018-11-27 NOTE — Progress Notes (Signed)
   PRENATAL VISIT NOTE  Subjective:  Latoya Stark is a 23 y.o. G4P0030 at [redacted]w[redacted]d being seen today for ongoing prenatal care.  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  Patient reports occasional contractions and occasional llq pain.  Contractions: Irritability. Vag. Bleeding: None.  Movement: Present. Denies leaking of fluid.   The following portions of the patient's history were reviewed and updated as appropriate: allergies, current medications, past family history, past medical history, past social history, past surgical history and problem list.   Objective:   Vitals:   11/27/18 0906  Weight: (!) 305 lb (138.3 kg)    Fetal Status: Fetal Heart Rate (bpm): 135   Movement: Present     General:  Alert, oriented and cooperative. Patient is in no acute distress.  Skin: Skin is warm and dry. No rash noted.   Cardiovascular: Normal heart rate noted  Respiratory: Normal respiratory effort, no problems with respiration noted  Abdomen: Soft, gravid, appropriate for gestational age.  Pain/Pressure: Present     Pelvic: Cervical exam deferred        Extremities: Normal range of motion.  Edema: None  Mental Status: Normal mood and affect. Normal behavior. Normal judgment and thought content.   Assessment and Plan:  Pregnancy: [id number] at [redacted]w[redacted]d 1. Supervision of high risk pregnancy, antepartum F/u for 2 hr GTT 2 weeks  2. 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 [facility]) Was hospitalized 11/14 after seizure. She received BMZ 2 dosed IM and he Keppra was increased  3. Maternal obesity affecting pregnancy, antepartum F/u growth  Preterm labor symptoms and general  obstetric precautions including but not limited to vaginal bleeding, contractions, leaking of fluid and fetal movement were reviewed in detail with the patient. Please refer to After Visit Summary for other counseling recommendations.   Return in about 2 weeks (around 12/11/2018) for 2 hr GTT.  Future Appointments  Date Time Provider [place]  12/10/2018 10:45 AM [place] [facility]  12/10/2018 10:45 AM [facility] [place] 2 [facility]  12/11/2018  8:00 AM [facility] None  12/11/2018 11:15 AM Constant, Vickii Chafe, MD [place] None  02/19/2019  9:30 AM Lomax, Amy, NP [facility] None    Emeterio Reeve, MD

## 2018-11-27 NOTE — Patient Instructions (Signed)

## 2018-11-29 ENCOUNTER — Encounter: Payer: Medicaid Other | Admitting: Obstetrics and Gynecology

## 2018-12-10 ENCOUNTER — Other Ambulatory Visit (HOSPITAL_COMMUNITY): Payer: Self-pay | Admitting: *Deleted

## 2018-12-10 ENCOUNTER — Ambulatory Visit (HOSPITAL_COMMUNITY)
Admission: RE | Admit: 2018-12-10 | Discharge: 2018-12-10 | Disposition: A | Discharge status: Home / Self Care | Payer: Medicaid Other | Source: Ambulatory Visit | Attending: Maternal & Fetal Medicine | Admitting: Maternal & Fetal Medicine

## 2018-12-10 ENCOUNTER — Other Ambulatory Visit: Payer: Self-pay

## 2018-12-10 ENCOUNTER — Ambulatory Visit (HOSPITAL_COMMUNITY): Payer: Medicaid Other | Admitting: *Deleted

## 2018-12-10 ENCOUNTER — Encounter (HOSPITAL_COMMUNITY): Payer: Self-pay

## 2018-12-10 DIAGNOSIS — G40909 Epilepsy, unspecified, not intractable, without status epilepticus: Secondary | ICD-10-CM

## 2018-12-10 DIAGNOSIS — O99210 Obesity complicating pregnancy, unspecified trimester: Secondary | ICD-10-CM

## 2018-12-10 DIAGNOSIS — O99891 Other specified diseases and conditions complicating pregnancy: Secondary | ICD-10-CM | POA: Diagnosis not present

## 2018-12-10 DIAGNOSIS — J45909 Unspecified asthma, uncomplicated: Secondary | ICD-10-CM

## 2018-12-10 DIAGNOSIS — Z6841 Body Mass Index (BMI) 40.0 and over, adult: Secondary | ICD-10-CM

## 2018-12-10 DIAGNOSIS — O0990 Supervision of high risk pregnancy, unspecified, unspecified trimester: Secondary | ICD-10-CM

## 2018-12-10 DIAGNOSIS — Z87898 Personal history of other specified conditions: Secondary | ICD-10-CM

## 2018-12-10 DIAGNOSIS — O99212 Obesity complicating pregnancy, second trimester: Secondary | ICD-10-CM | POA: Diagnosis not present

## 2018-12-10 DIAGNOSIS — O99352 Diseases of the nervous system complicating pregnancy, second trimester: Secondary | ICD-10-CM

## 2018-12-10 DIAGNOSIS — Z3A27 27 weeks gestation of pregnancy: Secondary | ICD-10-CM

## 2018-12-10 DIAGNOSIS — Z362 Encounter for other antenatal screening follow-up: Secondary | ICD-10-CM

## 2018-12-11 ENCOUNTER — Other Ambulatory Visit: Payer: Medicaid Other

## 2018-12-11 ENCOUNTER — Ambulatory Visit (INDEPENDENT_AMBULATORY_CARE_PROVIDER_SITE_OTHER): Payer: Medicaid Other | Admitting: Obstetrics and Gynecology

## 2018-12-11 VITALS — BP 134/75 | HR 97 | Wt 305.5 lb

## 2018-12-11 DIAGNOSIS — O99212 Obesity complicating pregnancy, second trimester: Secondary | ICD-10-CM

## 2018-12-11 DIAGNOSIS — O0990 Supervision of high risk pregnancy, unspecified, unspecified trimester: Secondary | ICD-10-CM

## 2018-12-11 DIAGNOSIS — Z23 Encounter for immunization: Secondary | ICD-10-CM

## 2018-12-11 DIAGNOSIS — O99210 Obesity complicating pregnancy, unspecified trimester: Secondary | ICD-10-CM

## 2018-12-11 DIAGNOSIS — Z3A27 27 weeks gestation of pregnancy: Secondary | ICD-10-CM

## 2018-12-11 DIAGNOSIS — O0992 Supervision of high risk pregnancy, unspecified, second trimester: Secondary | ICD-10-CM

## 2018-12-11 DIAGNOSIS — G40909 Epilepsy, unspecified, not intractable, without status epilepticus: Secondary | ICD-10-CM

## 2018-12-11 DIAGNOSIS — O99353 Diseases of the nervous system complicating pregnancy, third trimester: Secondary | ICD-10-CM

## 2018-12-11 DIAGNOSIS — O99352 Diseases of the nervous system complicating pregnancy, second trimester: Secondary | ICD-10-CM

## 2018-12-11 MED ORDER — PANTOPRAZOLE SODIUM 40 MG PO TBEC: 40.0000 mg | DELAYED_RELEASE_TABLET | Freq: Every day | ORAL | 1 refills | Status: DC

## 2018-12-11 NOTE — Progress Notes (Signed)
   PRENATAL VISIT NOTE  Subjective:  Latoya Stark is a 23 y.o. G4P0030 at [redacted]w[redacted]d being seen today for ongoing prenatal care.  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  Patient reports backache.  Contractions: Not present. Vag. Bleeding: None.  Movement: Present. Denies leaking of fluid.   The following portions of the patient's history were reviewed and updated as appropriate: allergies, current medications, past family history, past medical history, past social history, past surgical history and problem list.   Objective:   Vitals:   12/11/18 0953  BP: 134/75  Pulse: 97  Weight: (!) 305 lb 8 oz (138.6 kg)    Fetal Status:     Movement: Present     General:  Alert, oriented and cooperative. Patient is in no acute distress.  Skin: Skin is warm and dry. No rash noted.   Cardiovascular: Normal heart rate noted  Respiratory: Normal respiratory effort, no problems with respiration noted  Abdomen: Soft, gravid, appropriate for gestational age.  Pain/Pressure: Present     Pelvic: Cervical exam deferred        Extremities: Normal range of motion.  Edema: None  Mental Status: Normal mood and affect. Normal behavior. Normal judgment and thought content.   Assessment and Plan:  Pregnancy: [id number] at [redacted]w[redacted]d 1. Supervision of high risk pregnancy, antepartum Patient is doing well complaining of occasional back pain Instructions on how to use the maternity belt provided Third trimester labs today - 2 Hour GTT - CBC - RPR - HIV antibody  2. Seizure disorder during pregnancy in third trimester ([place]) Continue on keppra (dose increased in mid november   3. Maternal obesity affecting pregnancy, antepartum Follow up growth ultrasound  Preterm labor symptoms and general obstetric precautions including but not limited to vaginal bleeding, contractions, leaking of fluid and fetal movement were reviewed in detail with the patient. Please refer to After Visit Summary for other counseling recommendations.   Return in about 2 weeks (around 12/25/2018) for Virtual, ROB, High risk.  Future Appointments  Date Time Provider [place]  12/11/2018 11:15 AM [place] Cerullo, Vickii Chafe, MD [place] None  01/07/2019 11:00 AM [facility] [place] 3 [facility]  01/07/2019 11:10 AM [facility] NURSE [facility]  02/19/2019  9:30 AM Lomax, Amy, NP [facility] None    Mora Bellman, MD

## 2018-12-11 NOTE — Addendum Note (Signed)
Addended byCleotilde Neer on: 12/11/2018 11:33 AM   Modules accepted: Orders

## 2018-12-11 NOTE — Progress Notes (Signed)
Patient reports fetal movement with occasional back pain.

## 2018-12-12 LAB — CBC
Hematocrit: 31.8 % — ABNORMAL LOW (ref 34.0–46.6)
Hemoglobin: 10.9 g/dL — ABNORMAL LOW (ref 11.1–15.9)
MCH: 29.4 pg (ref 26.6–33.0)
MCHC: 34.3 g/dL (ref 31.5–35.7)
MCV: 86 fL (ref 79–97)
Platelets: 268 10*3/uL (ref 150–450)
RBC: 3.71 x10E6/uL — ABNORMAL LOW (ref 3.77–5.28)
RDW: 13.4 % (ref 11.7–15.4)
WBC: 6.9 10*3/uL (ref 3.4–10.8)

## 2018-12-12 LAB — GLUCOSE TOLERANCE, 2 HOURS W/ 1HR
Glucose, 1 hour: 155 mg/dL (ref 65–179)
Glucose, 2 hour: 124 mg/dL (ref 65–152)
Glucose, Fasting: 82 mg/dL (ref 65–91)

## 2018-12-12 LAB — RPR: RPR Ser Ql: NONREACTIVE

## 2018-12-12 LAB — HIV ANTIBODY (ROUTINE TESTING W REFLEX): HIV Screen 4th Generation wRfx: NONREACTIVE

## 2018-12-18 ENCOUNTER — Encounter (HOSPITAL_COMMUNITY): Payer: Self-pay

## 2018-12-18 ENCOUNTER — Other Ambulatory Visit: Payer: Self-pay

## 2018-12-18 ENCOUNTER — Inpatient Hospital Stay (HOSPITAL_COMMUNITY)
Admission: AD | Admit: 2018-12-18 | Discharge: 2018-12-18 | Disposition: A | Discharge status: Home / Self Care | Payer: Medicaid Other | Attending: Obstetrics and Gynecology | Admitting: Obstetrics and Gynecology

## 2018-12-18 ENCOUNTER — Inpatient Hospital Stay (HOSPITAL_BASED_OUTPATIENT_CLINIC_OR_DEPARTMENT_OTHER): Payer: Medicaid Other

## 2018-12-18 DIAGNOSIS — O99891 Other specified diseases and conditions complicating pregnancy: Secondary | ICD-10-CM | POA: Diagnosis not present

## 2018-12-18 DIAGNOSIS — Z885 Allergy status to narcotic agent status: Secondary | ICD-10-CM | POA: Insufficient documentation

## 2018-12-18 DIAGNOSIS — Z3A28 28 weeks gestation of pregnancy: Secondary | ICD-10-CM

## 2018-12-18 DIAGNOSIS — O99213 Obesity complicating pregnancy, third trimester: Secondary | ICD-10-CM | POA: Diagnosis not present

## 2018-12-18 DIAGNOSIS — O99353 Diseases of the nervous system complicating pregnancy, third trimester: Secondary | ICD-10-CM

## 2018-12-18 DIAGNOSIS — O368130 Decreased fetal movements, third trimester, not applicable or unspecified: Secondary | ICD-10-CM | POA: Insufficient documentation

## 2018-12-18 DIAGNOSIS — E669 Obesity, unspecified: Secondary | ICD-10-CM | POA: Insufficient documentation

## 2018-12-18 DIAGNOSIS — Z880 Allergy status to penicillin: Secondary | ICD-10-CM | POA: Insufficient documentation

## 2018-12-18 DIAGNOSIS — Z882 Allergy status to sulfonamides status: Secondary | ICD-10-CM | POA: Insufficient documentation

## 2018-12-18 DIAGNOSIS — Z803 Family history of malignant neoplasm of breast: Secondary | ICD-10-CM | POA: Insufficient documentation

## 2018-12-18 DIAGNOSIS — Z91040 Latex allergy status: Secondary | ICD-10-CM | POA: Diagnosis not present

## 2018-12-18 DIAGNOSIS — Z800 Family history of malignant neoplasm of digestive organs: Secondary | ICD-10-CM | POA: Insufficient documentation

## 2018-12-18 DIAGNOSIS — Z91011 Allergy to milk products: Secondary | ICD-10-CM | POA: Diagnosis not present

## 2018-12-18 DIAGNOSIS — N898 Other specified noninflammatory disorders of vagina: Secondary | ICD-10-CM | POA: Diagnosis not present

## 2018-12-18 DIAGNOSIS — O99513 Diseases of the respiratory system complicating pregnancy, third trimester: Secondary | ICD-10-CM | POA: Insufficient documentation

## 2018-12-18 DIAGNOSIS — Z91018 Allergy to other foods: Secondary | ICD-10-CM | POA: Insufficient documentation

## 2018-12-18 DIAGNOSIS — J45909 Unspecified asthma, uncomplicated: Secondary | ICD-10-CM | POA: Insufficient documentation

## 2018-12-18 DIAGNOSIS — Z3689 Encounter for other specified antenatal screening: Secondary | ICD-10-CM | POA: Insufficient documentation

## 2018-12-18 DIAGNOSIS — O26893 Other specified pregnancy related conditions, third trimester: Secondary | ICD-10-CM | POA: Diagnosis not present

## 2018-12-18 DIAGNOSIS — O368120 Decreased fetal movements, second trimester, not applicable or unspecified: Secondary | ICD-10-CM

## 2018-12-18 DIAGNOSIS — Z833 Family history of diabetes mellitus: Secondary | ICD-10-CM | POA: Diagnosis not present

## 2018-12-18 DIAGNOSIS — G40909 Epilepsy, unspecified, not intractable, without status epilepticus: Secondary | ICD-10-CM

## 2018-12-18 LAB — URINALYSIS, ROUTINE W REFLEX MICROSCOPIC
Bilirubin Urine: NEGATIVE
Glucose, UA: NEGATIVE mg/dL
Hgb urine dipstick: NEGATIVE
Ketones, ur: NEGATIVE mg/dL
Leukocytes,Ua: NEGATIVE
Nitrite: NEGATIVE
Protein, ur: NEGATIVE mg/dL
Specific Gravity, Urine: 1.021 (ref 1.005–1.030)
pH: 6 (ref 5.0–8.0)

## 2018-12-18 LAB — WET PREP, GENITAL
Clue Cells Wet Prep HPF POC: NONE SEEN
Sperm: NONE SEEN
Trich, Wet Prep: NONE SEEN
Yeast Wet Prep HPF POC: NONE SEEN

## 2018-12-18 NOTE — MAU Note (Signed)
Latoya Stark is a 23 y.o. at [redacted]w[redacted]d here in MAU reporting: back pain that started last night, took 1 extra strength tylenol today around 1230 and it helped a little bit. Has not felt any FM today, states yesterday he was less active than normal. No bleeding or LOF. States she has been having a discharge for 3 days, it is clear and only see it when she wipes.   Onset of complaint: last night  Pain score: 5/10  Vitals:   12/18/18 1742  BP: 107/79  Pulse: (!) 114  Resp: 18  Temp: 98.2 F (36.8 C)  SpO2: 99%     FHT: 132  Lab orders placed from triage: UA

## 2018-12-18 NOTE — MAU Provider Note (Signed)
History     CSN: [id number]  Arrival date and time: 12/18/18 1724   First Provider Initiated Contact with Patient 12/18/18 1827      No chief complaint on file.  HPI   Ms.Latoya Stark is a 23 y.o. female G61P0030 @ 96w5dhere reporting no fetal movement all day long. States she never felt a "kick" or a "jump" all day. She normally feels the baby move 6-10 x per hour. She felt movement yesterday however it was still a lot less than normal (3x per hour). Since her arrival to MAU she has felt the baby move 2x in one hour. She attests to white, thin discharge. She says this is normal discharge and she is not concerned about it. It is not watery and she is not concerned that her water has broken. No vaginal bleeding. No pain.   OB History    Gravida  4   Para      Term      Preterm      AB  3   Living        SAB  3   TAB      Ectopic      Multiple      Live Births              Past Medical History:  Diagnosis Date  . ADHD (attention deficit hyperactivity disorder)   . Anemia   . Anxiety   . Asthma   . Chronic migraine 08/01/2018  . Depression   . Gonorrhea in female 10/05/2013  . 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[place] 08/01/2018  . Headache   . Obesity   . Seizures ([place]    "when I get too hot"  . Trichomonas infection   . UTI (lower urinary tract infection)     Past Surgical History:  Procedure Laterality Date  . ADENOIDECTOMY    . DILATION AND EVACUATION N/A 08/09/2013   Procedure: DILATATION AND EVACUATION;  Surgeon: BFrederico Hamman MD;  [facility];  Service: Gynecology;  Laterality: N/A;  . ORTHOPEDIC SURGERY Left    left hand  . TONSILLECTOMY      Family History  Problem Relation Age of Onset  . Seizures Mother   . Stroke Mother   . Hypertension Father   . Diabetes Maternal Grandmother   . Colon cancer Maternal Grandmother   . Hypertension Maternal Grandmother   . Clotting disorder Maternal Grandmother   . Breast cancer Maternal  Grandmother   . Stroke Maternal Grandmother   . Diabetes Paternal Grandmother   . Seizures Maternal Aunt     Social History   Tobacco Use  . Smoking status: Never Smoker  . Smokeless tobacco: Never Used  Substance Use Topics  . Alcohol use: No    Alcohol/week: 0.0 standard drinks  . Drug use: No    Allergies:  Allergies  Allergen Reactions  . Milk-Related Compounds Dermatitis    Breaks face out *can have 2% milk*  . Mushroom Extract Complex Anaphylaxis  . Penicillins Anaphylaxis    Has patient had a PCN reaction causing immediate rash, facial/tongue/throat swelling, SOB or lightheadedness with hypotension: Yes Has patient had a PCN reaction causing severe rash involving mucus membranes or skin necrosis: No Has patient had a PCN reaction that required hospitalization No Has patient had a PCN reaction occurring within the last 10 years: Yes If all of the above answers are "NO", then may proceed with Cephalosporin use.   .Latoya Stark  Other (See Comments)    Breaks face out  . Latex Itching and Swelling  . Sulfa Antibiotics Swelling  . Vicodin [Hydrocodone-Acetaminophen] Hives    Medications Prior to Admission  Medication Sig Dispense Refill Last Dose  . acetaminophen (TYLENOL) 500 MG tablet Take 1,000 mg by mouth every 6 (six) hours as needed for headache.   12/18/2018 at Unknown time  . albuterol (PROVENTIL HFA;VENTOLIN HFA) 108 (90 Base) MCG/ACT inhaler Inhale 2 puffs into the lungs every 6 (six) hours as needed for wheezing or shortness of breath. 1 Inhaler 0 Past Month at Unknown time  . aspirin EC 81 MG tablet Take 1 tablet (81 mg total) by mouth daily. Take after 12 weeks for prevention of preeclampsia later in pregnancy 300 tablet 2 12/18/2018 at Unknown time  . Blood Pressure KIT Monitor BP readings at home regularly O09.90 extra large 1 kit 0 12/18/2018 at Unknown time  . cyclobenzaprine (FLEXERIL) 10 MG tablet Take 1 tablet (10 mg total) by mouth 3 (three) times daily as  needed for muscle spasms (back pain). 30 tablet 0 12/18/2018 at Unknown time  . docusate sodium (COLACE) 100 MG capsule Take 1 capsule (100 mg total) by mouth 2 (two) times daily. 60 capsule 2 12/18/2018 at Unknown time  . Doxylamine-Pyridoxine (DICLEGIS) 10-10 MG TBEC Take 2 tablets by mouth at bedtime. If symptoms persist, add one tablet in the morning and one in the afternoon 100 tablet 5 12/18/2018 at Unknown time  . Elastic Bandages & Supports (COMFORT FIT MATERNITY SUPP LG) MISC 1 application by Does not apply route daily. 1 each 0 12/18/2018 at Unknown time  . folic acid (FOLVITE) 1 MG tablet Take 1 tablet (1 mg total) by mouth daily. 30 tablet 10 12/18/2018 at Unknown time  . levETIRAcetam (KEPPRA) 750 MG tablet Take 2 tablets (1,500 mg total) by mouth 2 (two) times daily. 120 tablet 2 12/18/2018 at Unknown time  . metoCLOPramide (REGLAN) 5 MG tablet Take 1 tablet (5 mg total) by mouth 3 (three) times daily. 90 tablet 1 12/18/2018 at Unknown time  . ondansetron (ZOFRAN ODT) 4 MG disintegrating tablet Take 1 tablet (4 mg total) by mouth every 8 (eight) hours as needed for nausea or vomiting. 20 tablet 0 12/18/2018 at Unknown time  . pantoprazole (PROTONIX) 40 MG tablet Take 1 tablet (40 mg total) by mouth daily. 30 tablet 1 12/18/2018 at Unknown time  . polyethylene glycol (MIRALAX / GLYCOLAX) 17 g packet Take 17 g by mouth 2 (two) times daily. 24 each 3 12/18/2018 at Unknown time  . Prenat w/o A-FeCbn-Meth-FA-DHA (PRENATE MINI) 29-0.6-0.4-350 MG CAPS Take 1 capsule by mouth daily before breakfast. 30 capsule 11 12/18/2018 at Unknown time  . senna (SENOKOT) 8.6 MG TABS tablet Take 1 tablet (8.6 mg total) by mouth daily. 120 tablet 0 12/18/2018 at Unknown time  . terconazole (TERAZOL 7) 0.4 % vaginal cream Place 1 applicator vaginally at bedtime. Use for seven days (Patient not taking: Reported on 12/11/2018) 45 g 0    Results for orders placed or performed during the hospital encounter of 12/18/18 (from the  past 48 hour(s))  Urinalysis, Routine w reflex microscopic     Status: None   Collection Time: 12/18/18  6:15 PM  Result Value Ref Range   Color, Urine YELLOW YELLOW   APPearance CLEAR CLEAR   Specific Gravity, Urine 1.021 1.005 - 1.030   pH 6.0 5.0 - 8.0   Glucose, UA NEGATIVE NEGATIVE mg/dL   Hgb urine  dipstick NEGATIVE NEGATIVE   Bilirubin Urine NEGATIVE NEGATIVE   Ketones, ur NEGATIVE NEGATIVE mg/dL   Protein, ur NEGATIVE NEGATIVE mg/dL   Nitrite NEGATIVE NEGATIVE   Leukocytes,Ua NEGATIVE NEGATIVE    Comment: Performed at [facility] [address], [place], [place] [address]  Wet prep, genital     Status: Abnormal   Collection Time: 12/18/18  6:43 PM  Result Value Ref Range   Yeast Wet Prep HPF POC NONE SEEN NONE SEEN   Trich, Wet Prep NONE SEEN NONE SEEN   Clue Cells Wet Prep HPF POC NONE SEEN NONE SEEN   WBC, Wet Prep HPF POC FEW (A) NONE SEEN   Sperm NONE SEEN     Comment: Performed at [facility], [place] [address], [place], [place] [address]   Review of Systems  Constitutional: Negative for fever.  Gastrointestinal: Negative for abdominal pain.  Genitourinary: Positive for vaginal discharge. Negative for vaginal bleeding.   Physical Exam   Blood pressure 131/67, pulse (!) 107, temperature 98.2 F (36.8 C), temperature source Oral, resp. rate 18, height '5\' 4"'  (1.626 m), weight (!) 139.7 kg, last menstrual period 05/31/2018, SpO2 99 %.  Physical Exam  Constitutional: She is oriented to person, place, and time. She appears well-developed and well-nourished. No distress.  HENT:  Head: Normocephalic.  Genitourinary:    Genitourinary Comments: Vagina - Small amount of white vaginal discharge, no odor, no pooling of fluid  Cervix - appears close  Bimanual exam: deferred  GC/Chlam, wet prep done Chaperone present for exam.    Musculoskeletal: Normal range of motion.  Neurological: She is alert and oriented to person, place, and time.  Skin: Skin is warm.  She is not diaphoretic.  Psychiatric: Her behavior is normal.   Fetal Tracing: Baseline: 125 bpm Variability: Moderate  Accelerations: 15x15 Decelerations: none Toco: None  MAU Course  Procedures  None   MDM  BPP 8/8, patient feeling reassured. Reactive NST with adjusted score 10/10 Wet prep negative Discussed in detail anterior placenta which can affect the way she feels movements. Discussed turning to her left side and drinking cold fluids in an effort to feel babies movements at home.   Assessment and Plan   A:  1. NST (non-stress test) reactive   2. [redacted] weeks gestation of pregnancy   3. Decreased fetal movement affecting management of pregnancy in second trimester, not applicable or unspecified fetus   4. Vaginal discharge during pregnancy, antepartum     P:  Discharge home in stable condition Return to MAU if symptoms worsen  Patient left MAU feeling reassured Kick counts reviewed at home.    Lezlie Lye, NP  12/19/2018 1:54 PM

## 2018-12-20 LAB — GC/CHLAMYDIA PROBE AMP (~~LOC~~) NOT AT ARMC
Chlamydia: NEGATIVE
Comment: NEGATIVE
Comment: NORMAL
Neisseria Gonorrhea: NEGATIVE

## 2018-12-25 ENCOUNTER — Telehealth (INDEPENDENT_AMBULATORY_CARE_PROVIDER_SITE_OTHER): Payer: Medicaid Other | Admitting: Obstetrics and Gynecology

## 2018-12-25 ENCOUNTER — Encounter: Payer: Self-pay | Admitting: Obstetrics and Gynecology

## 2018-12-25 VITALS — BP 140/96

## 2018-12-25 DIAGNOSIS — O99210 Obesity complicating pregnancy, unspecified trimester: Secondary | ICD-10-CM

## 2018-12-25 DIAGNOSIS — R030 Elevated blood-pressure reading, without diagnosis of hypertension: Secondary | ICD-10-CM

## 2018-12-25 DIAGNOSIS — Z87898 Personal history of other specified conditions: Secondary | ICD-10-CM

## 2018-12-25 DIAGNOSIS — O99353 Diseases of the nervous system complicating pregnancy, third trimester: Secondary | ICD-10-CM

## 2018-12-25 DIAGNOSIS — O0993 Supervision of high risk pregnancy, unspecified, third trimester: Secondary | ICD-10-CM

## 2018-12-25 DIAGNOSIS — G40409 Other generalized epilepsy and epileptic syndromes, not intractable, without status epilepticus: Secondary | ICD-10-CM

## 2018-12-25 DIAGNOSIS — Z3A29 29 weeks gestation of pregnancy: Secondary | ICD-10-CM

## 2018-12-25 DIAGNOSIS — O99213 Obesity complicating pregnancy, third trimester: Secondary | ICD-10-CM

## 2018-12-25 DIAGNOSIS — O0990 Supervision of high risk pregnancy, unspecified, unspecified trimester: Secondary | ICD-10-CM

## 2018-12-25 MED ORDER — BLOOD PRESSURE CUFF MISC: 1.0000 | 0 refills | Status: DC

## 2018-12-25 NOTE — Progress Notes (Signed)
[place] VIRTUAL VIDEO VISIT ENCOUNTER NOTE  Provider location: [facility] [place] [facility] [place]   I connected with Latoya Stark on 12/25/18 at  4:15 PM EST by WebEx Video Encounter at home and verified that I am speaking with the correct person using two identifiers.   I discussed the limitations, risks, security and privacy concerns of performing an evaluation and management service virtually and the availability of in person appointments. I also discussed with the patient that there may be a patient responsible charge related to this service. The patient expressed understanding and agreed to proceed. Subjective:  Latoya Stark is a 23 y.o. G4P0030 at [redacted]w[redacted]d being seen today for ongoing prenatal care.  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  Patient reports no complaints.  Contractions: Not present. Vag. Bleeding: None.  Movement: Present. Denies any leaking of fluid.   The following portions of the patient's history were reviewed and updated as appropriate: allergies, current medications, past family history, past medical history, past social history, past surgical history and problem list.   Objective:   Vitals:   12/25/18 1528  BP: (!) 140/96    Fetal Status:     Movement: Present     General:  Alert, oriented and cooperative. Patient is in no acute distress.  Respiratory: Normal respiratory effort, no problems with respiration noted  Mental Status: Normal mood and affect. Normal behavior. Normal judgment and thought content.  Rest of physical exam deferred due to type of encounter  Imaging:   Assessment  and Plan:  Pregnancy: G4P0030 at [redacted]w[redacted]d  1. Supervision of high risk pregnancy, antepartum - Blood Pressure Monitoring (BLOOD PRESSURE CUFF) MISC; 1 Device by Does not apply route once a week.  Dispense: 1 each; Refill: 0  2. Maternal obesity affecting pregnancy, antepartum  3. History of seizure  4. 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 Elevated blood pressure reading without diagnosis of hypertension Mildly elevated today Will cont to watch No prior elevated BP   Preterm labor symptoms and general obstetric precautions including but not limited to vaginal bleeding, contractions, leaking of fluid and fetal movement were reviewed in detail with the patient. I discussed the assessment and treatment plan with the patient. The patient was provided an opportunity to ask questions and all were answered. The patient agreed with the plan and demonstrated an understanding of the instructions. The patient was advised to call back or seek an in-person office evaluation/go to MAU at [facility] for any urgent or concerning symptoms. Please refer to After Visit Summary for other counseling recommendations.   I provided 20 minutes of face-to-face time during this encounter.  Return in about 2 weeks (around 01/08/2019) for virtual, high OB.  Future Appointments  Date Time Provider [place]  12/25/2018  4:15 PM Sloan Leiter, MD [facility] None  01/07/2019 11:00 AM [facility] [place] 3 [facility]  01/07/2019 11:10 AM [facility] NURSE [facility]  02/19/2019  9:30 AM Lomax, Amy, NP [facility] None  Sloan Leiter, MD [facility] [place], [place]

## 2018-12-25 NOTE — Progress Notes (Signed)
Pt is on the phone preparing for virtual visit. ROB [redacted]w[redacted]d.

## 2019-01-07 ENCOUNTER — Ambulatory Visit (HOSPITAL_COMMUNITY): Payer: Medicaid Other | Admitting: *Deleted

## 2019-01-07 ENCOUNTER — Other Ambulatory Visit: Payer: Self-pay

## 2019-01-07 ENCOUNTER — Encounter (HOSPITAL_COMMUNITY): Payer: Self-pay

## 2019-01-07 ENCOUNTER — Ambulatory Visit (HOSPITAL_COMMUNITY)
Admission: RE | Admit: 2019-01-07 | Discharge: 2019-01-07 | Disposition: A | Discharge status: Home / Self Care | Payer: Medicaid Other | Source: Ambulatory Visit | Attending: Obstetrics and Gynecology | Admitting: Obstetrics and Gynecology

## 2019-01-07 ENCOUNTER — Other Ambulatory Visit (HOSPITAL_COMMUNITY): Payer: Self-pay | Admitting: *Deleted

## 2019-01-07 DIAGNOSIS — O99353 Diseases of the nervous system complicating pregnancy, third trimester: Secondary | ICD-10-CM | POA: Diagnosis not present

## 2019-01-07 DIAGNOSIS — Z362 Encounter for other antenatal screening follow-up: Secondary | ICD-10-CM | POA: Diagnosis not present

## 2019-01-07 DIAGNOSIS — Z87898 Personal history of other specified conditions: Secondary | ICD-10-CM | POA: Diagnosis not present

## 2019-01-07 DIAGNOSIS — O99213 Obesity complicating pregnancy, third trimester: Secondary | ICD-10-CM

## 2019-01-07 DIAGNOSIS — O0990 Supervision of high risk pregnancy, unspecified, unspecified trimester: Secondary | ICD-10-CM | POA: Insufficient documentation

## 2019-01-07 DIAGNOSIS — G40909 Epilepsy, unspecified, not intractable, without status epilepticus: Secondary | ICD-10-CM

## 2019-01-07 DIAGNOSIS — Z3A31 31 weeks gestation of pregnancy: Secondary | ICD-10-CM

## 2019-01-07 DIAGNOSIS — O99210 Obesity complicating pregnancy, unspecified trimester: Secondary | ICD-10-CM | POA: Insufficient documentation

## 2019-01-07 DIAGNOSIS — Z6841 Body Mass Index (BMI) 40.0 and over, adult: Secondary | ICD-10-CM

## 2019-01-08 ENCOUNTER — Telehealth: Payer: Medicaid Other | Admitting: Obstetrics and Gynecology

## 2019-01-10 NOTE — L&D Delivery Note (Addendum)
OB/GYN Faculty Practice Delivery Note  Latoya Stark is a 24 y.o. [id number] s/p vaginal delivery at [redacted]w[redacted]d. She was admitted for IOL secondary to poorly controlled seizure disorder.   ROM: 4h 31m with light meconium stained fluid GBS Status:  Negative/-- (02/03 0000)  Labor Progress: . Initial SVE: 1/50/-2. Patient received Cytotec, foley bulb, pitocin and AROM. She then progressed to complete.   Delivery Date/Time: 03/01/19 11:33am Delivery: Called to room and patient was complete and pushing. Head delivered in LOA position. Tight nuchal cord present and reduced. Shoulder and body delivered in usual fashion. Infant with spontaneous cry, placed on mother's abdomen, dried and stimulated. Cord clamped x 2 after 1-minute delay, and cut by FOB. Cord blood drawn. Placenta delivered spontaneously with gentle cord traction. Fundus firm with massage and Pitocin. Labia, perineum, vagina, and cervix inspected inspected with first degree perineal laceration which was repaired with 3-0 vicryl in standard fashion.  Baby Weight: Pending  Placenta: Sent to L&D Complications: None Lacerations: 1st degree EBL: 490 mL Analgesia: Epidural   Infant: APGAR (1 MIN): 8   APGAR (5 MINS): 9   APGAR (10 MINS):     Lurline Del, DO 03/01/2019, 12:05 PM  OB FELLOW DELIVERY ATTESTATION  I was gloved and present for the delivery in its entirety, and I agree with the above resident's note.    Barrington Ellison, MD [facility] Family Medicine Fellow, [facility] for [employer], [place]

## 2019-01-13 ENCOUNTER — Telehealth: Payer: Self-pay | Admitting: Obstetrics and Gynecology

## 2019-01-16 ENCOUNTER — Telehealth: Payer: Medicaid Other | Admitting: Obstetrics & Gynecology

## 2019-01-20 ENCOUNTER — Telehealth: Payer: Self-pay

## 2019-01-20 NOTE — Telephone Encounter (Signed)
Pt called and reports that she had some nausea and vomiting over the weekend. Pt reports that she is better today and able to eat and drink normally. Pt reports some braxton hicks contractions but nothing close together or too painful. Pt reports good fetal movement, denies HA, blurry vision, bleeding or leaking of fluid. I advised pt that she should call back or go to hospital for evaluation should any of the above symptoms occur, pt verbalizes understanding.

## 2019-01-21 ENCOUNTER — Other Ambulatory Visit: Payer: Self-pay

## 2019-01-21 ENCOUNTER — Encounter: Payer: Self-pay | Admitting: Obstetrics and Gynecology

## 2019-01-21 ENCOUNTER — Inpatient Hospital Stay (HOSPITAL_COMMUNITY)
Admission: AD | Admit: 2019-01-21 | Discharge: 2019-01-21 | Disposition: A | Discharge status: Home / Self Care | Payer: Medicaid Other | Attending: Family Medicine | Admitting: Family Medicine

## 2019-01-21 ENCOUNTER — Encounter (HOSPITAL_COMMUNITY): Payer: Self-pay | Admitting: Family Medicine

## 2019-01-21 ENCOUNTER — Ambulatory Visit (INDEPENDENT_AMBULATORY_CARE_PROVIDER_SITE_OTHER): Payer: Medicaid Other | Admitting: Obstetrics and Gynecology

## 2019-01-21 VITALS — BP 124/81 | HR 105 | Wt 310.0 lb

## 2019-01-21 DIAGNOSIS — Z3A33 33 weeks gestation of pregnancy: Secondary | ICD-10-CM | POA: Insufficient documentation

## 2019-01-21 DIAGNOSIS — O0993 Supervision of high risk pregnancy, unspecified, third trimester: Secondary | ICD-10-CM

## 2019-01-21 DIAGNOSIS — G40909 Epilepsy, unspecified, not intractable, without status epilepticus: Secondary | ICD-10-CM

## 2019-01-21 DIAGNOSIS — O99210 Obesity complicating pregnancy, unspecified trimester: Secondary | ICD-10-CM

## 2019-01-21 DIAGNOSIS — O99353 Diseases of the nervous system complicating pregnancy, third trimester: Secondary | ICD-10-CM

## 2019-01-21 DIAGNOSIS — Z7982 Long term (current) use of aspirin: Secondary | ICD-10-CM | POA: Diagnosis not present

## 2019-01-21 DIAGNOSIS — Z880 Allergy status to penicillin: Secondary | ICD-10-CM | POA: Diagnosis not present

## 2019-01-21 DIAGNOSIS — O368130 Decreased fetal movements, third trimester, not applicable or unspecified: Secondary | ICD-10-CM | POA: Diagnosis present

## 2019-01-21 DIAGNOSIS — O4693 Antepartum hemorrhage, unspecified, third trimester: Secondary | ICD-10-CM | POA: Insufficient documentation

## 2019-01-21 DIAGNOSIS — O0990 Supervision of high risk pregnancy, unspecified, unspecified trimester: Secondary | ICD-10-CM

## 2019-01-21 DIAGNOSIS — O99213 Obesity complicating pregnancy, third trimester: Secondary | ICD-10-CM

## 2019-01-21 DIAGNOSIS — N939 Abnormal uterine and vaginal bleeding, unspecified: Secondary | ICD-10-CM

## 2019-01-21 LAB — URINALYSIS, ROUTINE W REFLEX MICROSCOPIC
Bilirubin Urine: NEGATIVE
Glucose, UA: NEGATIVE mg/dL
Hgb urine dipstick: NEGATIVE
Ketones, ur: NEGATIVE mg/dL
Leukocytes,Ua: NEGATIVE
Nitrite: NEGATIVE
Protein, ur: NEGATIVE mg/dL
Specific Gravity, Urine: 1.015 (ref 1.005–1.030)
pH: 7 (ref 5.0–8.0)

## 2019-01-21 LAB — WET PREP, GENITAL
Clue Cells Wet Prep HPF POC: NONE SEEN
Sperm: NONE SEEN
Trich, Wet Prep: NONE SEEN
Yeast Wet Prep HPF POC: NONE SEEN

## 2019-01-21 NOTE — MAU Provider Note (Signed)
Chief Complaint:  Decreased Fetal Movement and Vaginal Bleeding   First Provider Initiated Contact with Patient 01/21/19 2118     HPI: Latoya Stark is a 24 y.o. G4P0030 at 9w4dwho presents to maternity admissions reporting decreased fetal movement.  States saw red blood after going to restroom.  Did have a bowel movement.  Initial wipe was red, then brown.  No further bleeding since. . She reports good fetal movement, denies LOF, vaginal bleeding, vaginal itching/burning, urinary symptoms, h/a, dizziness, n/v, diarrhea, constipation or fever/chills.  She denies headache, visual changes or RUQ abdominal pain.  Vaginal Bleeding The patient's primary symptoms include vaginal bleeding. The patient's pertinent negatives include no genital itching, genital lesions, genital odor or pelvic pain. This is a new problem. The current episode started today. The problem occurs rarely. The problem has been resolved. The patient is experiencing no pain. She is pregnant. Pertinent negatives include no abdominal pain, back pain, chills, constipation, diarrhea, dysuria, fever, frequency, nausea or vomiting. The vaginal discharge was bloody. The vaginal bleeding is spotting. She has not been passing clots. She has not been passing tissue. Nothing aggravates the symptoms. She has tried nothing for the symptoms.    RN Note: PT SAYS BABY USUALLY MOVES A LOT - BUT PLACENTA IS IN FRONT . FHR- 152 IN TRIAGE . LAST MOVEMENT WAS AT 430 PM.   AT 630PM - VOIDED - SAW WHEN SHE WIPED - AND SCANT AMT  IN TOILET- BRIGHT RED . AT 814PM- BACK TO B-ROOM - WHEN WIPED - DARK BROWN.   FELT CRAMPS ALL DAY.  HAD DR APPOINTMENT - DR CONSTANT -  AT 10AM- ALL OK.  LAST SEX-  3 WEEKS AGO  Past Medical History: Past Medical History:  Diagnosis Date  . ADHD (attention deficit hyperactivity disorder)   . Anemia   . Anxiety   . Asthma   . Chronic migraine 08/01/2018  . Depression   . Gonorrhea in female 10/05/2013  . 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 Headache   . Obesity   . Seizures ([place])    "when I get too hot"  . Trichomonas infection   . UTI (lower urinary tract infection)     Past obstetric history: OB History  Gravida Para Term Preterm AB Living  4       3    SAB TAB Ectopic Multiple Live Births  3            # Outcome Date GA Lbr Len/2nd Weight Sex Delivery Anes PTL Lv  4 Current           3 SAB 10/10/14          2 SAB           1 SAB              Birth Comments: System Generated. Please review and update pregnancy details.    Past Surgical History: Past Surgical History:  Procedure Laterality Date  . ADENOIDECTOMY    . DILATION AND EVACUATION N/A 08/09/2013   Procedure: DILATATION AND EVACUATION;  Surgeon: Frederico Hamman, MD;  Location: [facility];  Service: Gynecology;  Laterality: N/A;  . ORTHOPEDIC SURGERY Left    left hand  . TONSILLECTOMY      Family History: Family History  Problem Relation Age of Onset  . Seizures Mother   . Stroke Mother   . Hypertension Father   . Diabetes Maternal Grandmother   . Colon cancer Maternal Grandmother   .  Hypertension Maternal Grandmother   . Clotting disorder Maternal Grandmother   . Breast cancer Maternal Grandmother   . Stroke Maternal Grandmother   . Diabetes Paternal Grandmother   . Seizures Maternal Aunt     Social History: Social History   Tobacco Use  . Smoking status: Never Smoker  . Smokeless tobacco: Never Used  Substance Use Topics  . Alcohol use: No    Alcohol/week: 0.0 standard drinks  . Drug use: No    Allergies:  Allergies  Allergen Reactions  . Milk-Related Compounds Dermatitis    Breaks face out *can have 2% milk*  . Mushroom Extract Complex Anaphylaxis  . Penicillins Anaphylaxis    Has patient had a PCN reaction causing immediate rash, facial/tongue/throat swelling, SOB or lightheadedness with hypotension: Yes Has patient had a PCN reaction causing severe rash involving mucus membranes or skin  necrosis: No Has patient had a PCN reaction that required hospitalization No Has patient had a PCN reaction occurring within the last 10 years: Yes If all of the above answers are "NO", then may proceed with Cephalosporin use.   [name] Other (See Comments)    Breaks face out  . Latex Itching and Swelling  . Sulfa Antibiotics Swelling  . Vicodin [Hydrocodone-Acetaminophen] Hives    Meds:  Medications Prior to Admission  Medication Sig Dispense Refill Last Dose  . acetaminophen (TYLENOL) 500 MG tablet Take 1,000 mg by mouth every 6 (six) hours as needed for headache.     . albuterol (PROVENTIL HFA;VENTOLIN HFA) 108 (90 Base) MCG/ACT inhaler Inhale 2 puffs into the lungs every 6 (six) hours as needed for wheezing or shortness of breath. 1 Inhaler 0   . aspirin EC 81 MG tablet Take 1 tablet (81 mg total) by mouth daily. Take after 12 weeks for prevention of preeclampsia later in pregnancy 300 tablet 2   . Blood Pressure Monitoring (BLOOD PRESSURE CUFF) MISC 1 Device by Does not apply route once a week. 1 each 0   . cyclobenzaprine (FLEXERIL) 10 MG tablet Take 1 tablet (10 mg total) by mouth 3 (three) times daily as needed for muscle spasms (back pain). 30 tablet 0   . docusate sodium (COLACE) 100 MG capsule Take 1 capsule (100 mg total) by mouth 2 (two) times daily. 60 capsule 2   . Doxylamine-Pyridoxine (DICLEGIS) 10-10 MG TBEC Take 2 tablets by mouth at bedtime. If symptoms persist, add one tablet in the morning and one in the afternoon 100 tablet 5   . Elastic Bandages & Supports (COMFORT FIT MATERNITY SUPP LG) MISC 1 application by Does not apply route daily. 1 each 0   . folic acid (FOLVITE) 1 MG tablet Take 1 tablet (1 mg total) by mouth daily. 30 tablet 10   . levETIRAcetam (KEPPRA) 750 MG tablet Take 2 tablets (1,500 mg total) by mouth 2 (two) times daily. 120 tablet 2   . metoCLOPramide (REGLAN) 5 MG tablet Take 1 tablet (5 mg total) by mouth 3 (three) times daily. 90 tablet 1   .  ondansetron (ZOFRAN ODT) 4 MG disintegrating tablet Take 1 tablet (4 mg total) by mouth every 8 (eight) hours as needed for nausea or vomiting. 20 tablet 0   . pantoprazole (PROTONIX) 40 MG tablet Take 1 tablet (40 mg total) by mouth daily. 30 tablet 1   . polyethylene glycol (MIRALAX / GLYCOLAX) 17 g packet Take 17 g by mouth 2 (two) times daily. 24 each 3   . Prenat w/o A-FeCbn-Meth-FA-DHA (  PRENATE MINI) 29-0.6-0.4-350 MG CAPS Take 1 capsule by mouth daily before breakfast. 30 capsule 11   . senna (SENOKOT) 8.6 MG TABS tablet Take 1 tablet (8.6 mg total) by mouth daily. 120 tablet 0   . terconazole (TERAZOL 7) 0.4 % vaginal cream Place 1 applicator vaginally at bedtime. Use for seven days (Patient not taking: Reported on 12/11/2018) 45 g 0     I have reviewed patient's Past Medical Hx, Surgical Hx, Family Hx, Social Hx, medications and allergies.   ROS:  Review of Systems  Constitutional: Negative for chills and fever.  Gastrointestinal: Negative for abdominal pain, constipation, diarrhea, nausea and vomiting.  Genitourinary: Positive for vaginal bleeding. Negative for dysuria, frequency and pelvic pain.  Musculoskeletal: Negative for back pain.   Other systems negative  Physical Exam   Patient Vitals for the past 24 hrs:  BP Temp Temp src Pulse Resp Height Weight  01/21/19 2103 129/74 98.9 F (37.2 C) Oral (!) 119 20 5\' 4"  (1.626 m) (!) 141.7 kg   Constitutional: Well-developed, well-nourished female in no acute distress.  Cardiovascular: normal rate and rhythm Respiratory: normal effort, clear to auscultation bilaterally GI: Abd soft, non-tender, gravid appropriate for gestational age.   No rebound or guarding. MS: Extremities nontender, no edema, normal ROM Neurologic: Alert and oriented x 4.  GU: Neg CVAT.  PELVIC EXAM: Cervix pink, visually closed, without lesion, scant white creamy discharge, vaginal walls and external genitalia normal    No blood or colored discharge seen at  all Dilation: Closed Effacement (%): Thick Station: -3 Exam by:: Cia Garretson cnm    FHT:  Baseline 135 , moderate variability, accelerations present, no decelerations Contractions: Rare   Labs: Results for orders placed or performed during the hospital encounter of 01/21/19 (from the past 24 hour(s))  Wet prep, genital     Status: Abnormal   Collection Time: 01/21/19  9:30 PM   Specimen: Cervix  Result Value Ref Range   Yeast Wet Prep HPF POC NONE SEEN NONE SEEN   Trich, Wet Prep NONE SEEN NONE SEEN   Clue Cells Wet Prep HPF POC NONE SEEN NONE SEEN   WBC, Wet Prep HPF POC FEW (A) NONE SEEN   Sperm NONE SEEN   Urinalysis, Routine w reflex microscopic     Status: Abnormal   Collection Time: 01/21/19  9:40 PM  Result Value Ref Range   Color, Urine YELLOW YELLOW   APPearance CLOUDY (A) CLEAR   Specific Gravity, Urine 1.015 1.005 - 1.030   pH 7.0 5.0 - 8.0   Glucose, UA NEGATIVE NEGATIVE mg/dL   Hgb urine dipstick NEGATIVE NEGATIVE   Bilirubin Urine NEGATIVE NEGATIVE   Ketones, ur NEGATIVE NEGATIVE mg/dL   Protein, ur NEGATIVE NEGATIVE mg/dL   Nitrite NEGATIVE NEGATIVE   Leukocytes,Ua NEGATIVE NEGATIVE    --/--/AB POS, AB POS Performed at [facility], [address], [place],  [address]  [phone])  Imaging:  Placenta is anterior  MAU Course/MDM: I have ordered labs and reviewed results. Normal UA and negative wet prep  Cultures sent NST reviewed, reactive Discussed may have been temporary hemorrhoid bleeding.    Assessment: SIUP at [redacted]w[redacted]d Bleeding, not likely vaginal due to neg exam Decrease Fetal movement, resolve, reactive NST  Plan: Discharge home Bleeding precautions, Return if it happens again Preterm Labor precautions and fetal kick counts Follow up in Office for prenatal visits  Encouraged to return here or to other Urgent Care/ED if she develops worsening  of symptoms, increase in pain, fever, or other concerning symptoms.  Pt stable at  time of discharge.  Hansel Feinstein CNM, MSN Certified Nurse-Midwife 01/21/2019 9:18 PM

## 2019-01-21 NOTE — Discharge Instructions (Signed)
Third Trimester of Pregnancy  The third trimester is from week 28 through week 40 (months 7 through 9). This trimester is when your unborn baby (fetus) is growing very fast. At the end of the ninth month, the unborn baby is about 20 inches in length. It weighs about 6-10 pounds. Follow these instructions at home: Medicines  Take over-the-counter and prescription medicines only as told by your doctor. Some medicines are safe and some medicines are not safe during pregnancy.  Take a prenatal vitamin that contains at least 600 micrograms (mcg) of folic acid.  If you have trouble pooping (constipation), take medicine that will make your stool soft (stool softener) if your doctor approves. Eating and drinking   Eat regular, healthy meals.  Avoid raw meat and uncooked cheese.  If you get low calcium from the food you eat, talk to your doctor about taking a daily calcium supplement.  Eat four or five small meals rather than three large meals a day.  Avoid foods that are high in fat and sugars, such as fried and sweet foods.  To prevent constipation: ? Eat foods that are high in fiber, like fresh fruits and vegetables, whole grains, and beans. ? Drink enough fluids to keep your pee (urine) clear or pale yellow. Activity  Exercise only as told by your doctor. Stop exercising if you start to have cramps.  Avoid heavy lifting, wear low heels, and sit up straight.  Do not exercise if it is too hot, too humid, or if you are in a place of great height (high altitude).  You may continue to have sex unless your doctor tells you not to. Relieving pain and discomfort  Wear a good support bra if your breasts are tender.  Take frequent breaks and rest with your legs raised if you have leg cramps or low back pain.  Take warm water baths (sitz baths) to soothe pain or discomfort caused by hemorrhoids. Use hemorrhoid cream if your doctor approves.  If you develop puffy, bulging veins (varicose  veins) in your legs: ? Wear support hose or compression stockings as told by your doctor. ? Raise (elevate) your feet for 15 minutes, 3-4 times a day. ? Limit salt in your food. Safety  Wear your seat belt when driving.  Make a list of emergency phone numbers, including numbers for family, friends, the hospital, and police and fire departments. Preparing for your baby's arrival To prepare for the arrival of your baby:  Take prenatal classes.  Practice driving to the hospital.  Visit the hospital and tour the maternity area.  Talk to your work about taking leave once the baby comes.  Pack your hospital bag.  Prepare the baby's room.  Go to your doctor visits.  Buy a rear-facing car seat. Learn how to install it in your car. General instructions  Do not use hot tubs, steam rooms, or saunas.  Do not use any products that contain nicotine or tobacco, such as cigarettes and e-cigarettes. If you need help quitting, ask your doctor.  Do not drink alcohol.  Do not douche or use tampons or scented sanitary pads.  Do not cross your legs for long periods of time.  Do not travel for long distances unless you must. Only do so if your doctor says it is okay.  Visit your dentist if you have not gone during your pregnancy. Use a soft toothbrush to brush your teeth. Be gentle when you floss.  Avoid cat litter boxes and soil  used by cats. These carry germs that can cause birth defects in the baby and can cause a loss of your baby (miscarriage) or stillbirth.  Keep all your prenatal visits as told by your doctor. This is important. Contact a doctor if:  You are not sure if you are in labor or if your water has broken.  You are dizzy.  You have mild cramps or pressure in your lower belly.  You have a nagging pain in your belly area.  You continue to feel sick to your stomach, you throw up, or you have watery poop.  You have bad smelling fluid coming from your vagina.  You have  pain when you pee. Get help right away if:  You have a fever.  You are leaking fluid from your vagina.  You are spotting or bleeding from your vagina.  You have severe belly cramps or pain.  You lose or gain weight quickly.  You have trouble catching your breath and have chest pain.  You notice sudden or extreme puffiness (swelling) of your face, hands, ankles, feet, or legs.  You have not felt the baby move in over an hour.  You have severe headaches that do not go away with medicine.  You have trouble seeing.  You are leaking, or you are having a gush of fluid, from your vagina before you are 37 weeks.  You have regular belly spasms (contractions) before you are 37 weeks. Summary  The third trimester is from week 28 through week 40 (months 7 through 9). This time is when your unborn baby is growing very fast.  Follow your doctor's advice about medicine, food, and activity.  Get ready for the arrival of your baby by taking prenatal classes, getting all the baby items ready, preparing the baby's room, and visiting your doctor to be checked.  Get help right away if you are bleeding from your vagina, or you have chest pain and trouble catching your breath, or if you have not felt your baby move in over an hour. This information is not intended to replace advice given to you by your health care provider. Make sure you discuss any questions you have with your health care provider. Document Revised: 04/18/2018 Document Reviewed: 02/01/2016 Elsevier Patient Education  2020 [employer]. Hemorrhoids Hemorrhoids are swollen veins in and around the rectum or anus. There are two types of hemorrhoids:  Internal hemorrhoids. These occur in the veins that are just inside the rectum. They may poke through to the outside and become irritated and painful.  External hemorrhoids. These occur in the veins that are outside the anus and can be felt as a painful swelling or hard lump near the  anus. Most hemorrhoids do not cause serious problems, and they can be managed with home treatments such as diet and lifestyle changes. If home treatments do not help the symptoms, procedures can be done to shrink or remove the hemorrhoids. What are the causes? This condition is caused by increased pressure in the anal area. This pressure may result from various things, including:  Constipation.  Straining to have a bowel movement.  Diarrhea.  Pregnancy.  Obesity.  Sitting for long periods of time.  Heavy lifting or other activity that causes you to strain.  Anal sex.  Riding a bike for a long period of time. What are the signs or symptoms? Symptoms of this condition include:  Pain.  Anal itching or irritation.  Rectal bleeding.  Leakage of stool (feces).  Anal swelling.  One or more lumps around the anus. How is this diagnosed? This condition can often be diagnosed through a visual exam. Other exams or tests may also be done, such as:  An exam that involves feeling the rectal area with a gloved hand (digital rectal exam).  An exam of the anal canal that is done using a small tube (anoscope).  A blood test, if you have lost a significant amount of blood.  A test to look inside the colon using a flexible tube with a camera on the end (sigmoidoscopy or colonoscopy). How is this treated? This condition can usually be treated at home. However, various procedures may be done if dietary changes, lifestyle changes, and other home treatments do not help your symptoms. These procedures can help make the hemorrhoids smaller or remove them completely. Some of these procedures involve surgery, and others do not. Common procedures include:  Rubber band ligation. Rubber bands are placed at the base of the hemorrhoids to cut off their blood supply.  Sclerotherapy. Medicine is injected into the hemorrhoids to shrink them.  Infrared coagulation. A type of light energy is used to  get rid of the hemorrhoids.  Hemorrhoidectomy surgery. The hemorrhoids are surgically removed, and the veins that supply them are tied off.  Stapled hemorrhoidopexy surgery. The surgeon staples the base of the hemorrhoid to the rectal wall. Follow these instructions at home: Eating and drinking   Eat foods that have a lot of fiber in them, such as whole grains, beans, nuts, fruits, and vegetables.  Ask your health care provider about taking products that have added fiber (fiber supplements).  Reduce the amount of fat in your diet. You can do this by eating low-fat dairy products, eating less red meat, and avoiding processed foods.  Drink enough fluid to keep your urine pale yellow. Managing pain and swelling   Take warm sitz baths for 20 minutes, 3-4 times a day to ease pain and discomfort. You may do this in a bathtub or using a portable sitz bath that fits over the toilet.  If directed, apply ice to the affected area. Using ice packs between sitz baths may be helpful. ? Put ice in a plastic bag. ? Place a towel between your skin and the bag. ? Leave the ice on for 20 minutes, 2-3 times a day. General instructions  Take over-the-counter and prescription medicines only as told by your health care provider.  Use medicated creams or suppositories as told.  Get regular exercise. Ask your health care provider how much and what kind of exercise is best for you. In general, you should do moderate exercise for at least 30 minutes on most days of the week (150 minutes each week). This can include activities such as walking, biking, or yoga.  Go to the bathroom when you have the urge to have a bowel movement. Do not wait.  Avoid straining to have bowel movements.  Keep the anal area dry and clean. Use wet toilet paper or moist towelettes after a bowel movement.  Do not sit on the toilet for long periods of time. This increases blood pooling and pain.  Keep all follow-up visits as told  by your health care provider. This is important. Contact a health care provider if you have:  Increasing pain and swelling that are not controlled by treatment or medicine.  Difficulty having a bowel movement, or you are unable to have a bowel movement.  Pain or inflammation outside the  area of the hemorrhoids. Get help right away if you have:  Uncontrolled bleeding from your rectum. Summary  Hemorrhoids are swollen veins in and around the rectum or anus.  Most hemorrhoids can be managed with home treatments such as diet and lifestyle changes.  Taking warm sitz baths can help ease pain and discomfort.  In severe cases, procedures or surgery can be done to shrink or remove the hemorrhoids. This information is not intended to replace advice given to you by your health care provider. Make sure you discuss any questions you have with your health care provider. Document Revised: 05/24/2018 Document Reviewed: 05/17/2017 Elsevier Patient Education  [place].

## 2019-01-21 NOTE — MAU Note (Signed)
PT SAYS BABY USUALLY MOVES A LOT - BUT PLACENTA IS IN FRONT . FHR- 152 IN TRIAGE . LAST MOVEMENT WAS AT 430 PM.   AT 630PM - VOIDED - SAW WHEN SHE WIPED - AND SCANT AMT  IN TOILET- BRIGHT RED . AT 814PM- BACK TO B-ROOM - WHEN WIPED - DARK BROWN.   FELT CRAMPS ALL DAY.  HAD DR APPOINTMENT - DR CONSTANT -  AT 10AM- ALL OK.  LAST SEX-  3 WEEKS AGO.

## 2019-01-21 NOTE — Progress Notes (Signed)
   PRENATAL VISIT NOTE  Subjective:  Latoya Stark is a 24 y.o. G4P0030 at [redacted]w[redacted]d being seen today for ongoing prenatal care.  She is currently monitored for the following issues for this high-risk pregnancy and has Supervision of high risk pregnancy, antepartum; Maternal obesity affecting pregnancy, antepartum; History of seizure; 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[place]); Chronic migraine; UTI (urinary tract infection) in pregnancy in first trimester; and Seizure disorder in pregnancy [facility]) on their problem list.  Patient reports no complaints.  Contractions: Irregular. Vag. Bleeding: None.  Movement: Present. Denies leaking of fluid.   The following portions of the patient's history were reviewed and updated as appropriate: allergies, current medications, past family history, past medical history, past social history, past surgical history and problem list.   Objective:   Vitals:   01/21/19 1001  BP: 124/81  Pulse: (!) 105  Weight: (!) 310 lb (140.6 kg)    Fetal Status: Fetal Heart Rate (bpm): 130 Fundal Height: 34 cm Movement: Present     General:  Alert, oriented and cooperative. Patient is in no acute distress.  Skin: Skin is warm and dry. No rash noted.   Cardiovascular: Normal heart rate noted  Respiratory: Normal respiratory effort, no problems with respiration noted  Abdomen: Soft, gravid, appropriate for gestational age.  Pain/Pressure: Present     Pelvic: Cervical exam deferred        Extremities: Normal range of motion.  Edema: Mild pitting, slight indentation  Mental Status: Normal mood and affect. Normal behavior. Normal judgment and thought content.   Assessment and Plan:  Pregnancy: [id number] at [redacted]w[redacted]d 1. Supervision of high risk pregnancy, antepartum Patient is doing well without complaints  2. Seizure disorder during pregnancy in third trimester [facility]) Patient reports no further episodes of seizures since her last visit. She has not contacted her neurologist about them and  is scheduled to follow up with them in February Advised patient to contact neurologist sooner if seizures continue on medication  3. Maternal obesity affecting pregnancy, antepartum Continue ASA Follow up growth ultraosund  Preterm labor symptoms and general obstetric precautions including but not limited to vaginal bleeding, contractions, leaking of fluid and fetal movement were reviewed in detail with the patient. Please refer to After Visit Summary for other counseling recommendations.   No follow-ups on file.  Future Appointments  Date Time Provider [place]  02/04/2019 10:30 AM [facility]-MFC [place] 1 WH-MFCUS MFC-US  02/04/2019 10:40 AM WH-MFC NURSE [facility]-MFC MFC-US  02/04/2019  1:45 PM Chancy Milroy, MD [place] None  02/19/2019  9:30 AM Lomax, Amy, NP [facility] None    Mora Bellman, MD

## 2019-01-22 LAB — GC/CHLAMYDIA PROBE AMP (~~LOC~~) NOT AT ARMC
Chlamydia: NEGATIVE
Comment: NEGATIVE
Comment: NORMAL
Neisseria Gonorrhea: NEGATIVE

## 2019-02-04 ENCOUNTER — Encounter (HOSPITAL_COMMUNITY): Payer: Self-pay

## 2019-02-04 ENCOUNTER — Other Ambulatory Visit: Payer: Self-pay

## 2019-02-04 ENCOUNTER — Ambulatory Visit (HOSPITAL_COMMUNITY)
Admission: RE | Admit: 2019-02-04 | Discharge: 2019-02-04 | Disposition: A | Discharge status: Home / Self Care | Payer: Medicaid Other | Source: Ambulatory Visit | Attending: Obstetrics and Gynecology | Admitting: Obstetrics and Gynecology

## 2019-02-04 ENCOUNTER — Encounter: Payer: Self-pay | Admitting: Obstetrics and Gynecology

## 2019-02-04 ENCOUNTER — Ambulatory Visit (HOSPITAL_COMMUNITY): Payer: Medicaid Other | Admitting: *Deleted

## 2019-02-04 ENCOUNTER — Ambulatory Visit (INDEPENDENT_AMBULATORY_CARE_PROVIDER_SITE_OTHER): Payer: Medicaid Other | Admitting: Obstetrics and Gynecology

## 2019-02-04 VITALS — BP 127/79 | HR 111 | Wt 314.0 lb

## 2019-02-04 DIAGNOSIS — O99210 Obesity complicating pregnancy, unspecified trimester: Secondary | ICD-10-CM

## 2019-02-04 DIAGNOSIS — O99353 Diseases of the nervous system complicating pregnancy, third trimester: Secondary | ICD-10-CM

## 2019-02-04 DIAGNOSIS — Z362 Encounter for other antenatal screening follow-up: Secondary | ICD-10-CM

## 2019-02-04 DIAGNOSIS — O0990 Supervision of high risk pregnancy, unspecified, unspecified trimester: Secondary | ICD-10-CM

## 2019-02-04 DIAGNOSIS — O0993 Supervision of high risk pregnancy, unspecified, third trimester: Secondary | ICD-10-CM

## 2019-02-04 DIAGNOSIS — G40909 Epilepsy, unspecified, not intractable, without status epilepticus: Secondary | ICD-10-CM

## 2019-02-04 DIAGNOSIS — Z87898 Personal history of other specified conditions: Secondary | ICD-10-CM | POA: Diagnosis present

## 2019-02-04 DIAGNOSIS — O99213 Obesity complicating pregnancy, third trimester: Secondary | ICD-10-CM

## 2019-02-04 DIAGNOSIS — Z3A35 35 weeks gestation of pregnancy: Secondary | ICD-10-CM

## 2019-02-04 NOTE — Patient Instructions (Signed)
Third Trimester of Pregnancy The third trimester is from week 28 through week 40 (months 7 through 9). The third trimester is a time when the unborn baby (fetus) is growing rapidly. At the end of the ninth month, the fetus is about 20 inches in length and weighs 6-10 pounds. Body changes during your third trimester Your body will continue to go through many changes during pregnancy. The changes vary from woman to woman. During the third trimester:  Your weight will continue to increase. You can expect to gain 25-35 pounds (11-16 kg) by the end of the pregnancy.  You may begin to get stretch marks on your hips, abdomen, and breasts.  You may urinate more often because the fetus is moving lower into your pelvis and pressing on your bladder.  You may develop or continue to have heartburn. This is caused by increased hormones that slow down muscles in the digestive tract.  You may develop or continue to have constipation because increased hormones slow digestion and cause the muscles that push waste through your intestines to relax.  You may develop hemorrhoids. These are swollen veins (varicose veins) in the rectum that can itch or be painful.  You may develop swollen, bulging veins (varicose veins) in your legs.  You may have increased body aches in the pelvis, back, or thighs. This is due to weight gain and increased hormones that are relaxing your joints.  You may have changes in your hair. These can include thickening of your hair, rapid growth, and changes in texture. Some women also have hair loss during or after pregnancy, or hair that feels dry or thin. Your hair will most likely return to normal after your baby is born.  Your breasts will continue to grow and they will continue to become tender. A yellow fluid (colostrum) may leak from your breasts. This is the first milk you are producing for your baby.  Your belly button may stick out.  You may notice more swelling in your hands,  face, or ankles.  You may have increased tingling or numbness in your hands, arms, and legs. The skin on your belly may also feel numb.  You may feel short of breath because of your expanding uterus.  You may have more problems sleeping. This can be caused by the size of your belly, increased need to urinate, and an increase in your body's metabolism.  You may notice the fetus "dropping," or moving lower in your abdomen (lightening).  You may have increased vaginal discharge.  You may notice your joints feel loose and you may have pain around your pelvic bone. What to expect at prenatal visits You will have prenatal exams every 2 weeks until week 36. Then you will have weekly prenatal exams. During a routine prenatal visit:  You will be weighed to make sure you and the baby are growing normally.  Your blood pressure will be taken.  Your abdomen will be measured to track your baby's growth.  The fetal heartbeat will be listened to.  Any test results from the previous visit will be discussed.  You may have a cervical check near your due date to see if your cervix has softened or thinned (effaced).  You will be tested for Group B streptococcus. This happens between 35 and 37 weeks. Your health care provider may ask you:  What your birth plan is.  How you are feeling.  If you are feeling the baby move.  If you have had any abnormal   symptoms, such as leaking fluid, bleeding, severe headaches, or abdominal cramping.  If you are using any tobacco products, including cigarettes, chewing tobacco, and electronic cigarettes.  If you have any questions. Other tests or screenings that may be performed during your third trimester include:  Blood tests that check for low iron levels (anemia).  Fetal testing to check the health, activity level, and growth of the fetus. Testing is done if you have certain medical conditions or if there are problems during the pregnancy.  Nonstress test  (NST). This test checks the health of your baby to make sure there are no signs of problems, such as the baby not getting enough oxygen. During this test, a belt is placed around your belly. The baby is made to move, and its heart rate is monitored during movement. What is false labor? False labor is a condition in which you feel small, irregular tightenings of the muscles in the womb (contractions) that usually go away with rest, changing position, or drinking water. These are called Braxton Hicks contractions. Contractions may last for hours, days, or even weeks before true labor sets in. If contractions come at regular intervals, become more frequent, increase in intensity, or become painful, you should see your health care provider. What are the signs of labor?  Abdominal cramps.  Regular contractions that start at 10 minutes apart and become stronger and more frequent with time.  Contractions that start on the top of the uterus and spread down to the lower abdomen and back.  Increased pelvic pressure and dull back pain.  A watery or bloody mucus discharge that comes from the vagina.  Leaking of amniotic fluid. This is also known as your "water breaking." It could be a slow trickle or a gush. Let your health care provider know if it has a color or strange odor. If you have any of these signs, call your health care provider right away, even if it is before your due date. Follow these instructions at home: Medicines  Follow your health care provider's instructions regarding medicine use. Specific medicines may be either safe or unsafe to take during pregnancy.  Take a prenatal vitamin that contains at least 600 micrograms (mcg) of folic acid.  If you develop constipation, try taking a stool softener if your health care provider approves. Eating and drinking   Eat a balanced diet that includes fresh fruits and vegetables, whole grains, good sources of protein such as meat, eggs, or tofu,  and low-fat dairy. Your health care provider will help you determine the amount of weight gain that is right for you.  Avoid raw meat and uncooked cheese. These carry germs that can cause birth defects in the baby.  If you have low calcium intake from food, talk to your health care provider about whether you should take a daily calcium supplement.  Eat four or five small meals rather than three large meals a day.  Limit foods that are high in fat and processed sugars, such as fried and sweet foods.  To prevent constipation: ? Drink enough fluid to keep your urine clear or pale yellow. ? Eat foods that are high in fiber, such as fresh fruits and vegetables, whole grains, and beans. Activity  Exercise only as directed by your health care provider. Most women can continue their usual exercise routine during pregnancy. Try to exercise for 30 minutes at least 5 days a week. Stop exercising if you experience uterine contractions.  Avoid heavy lifting.  Do   not exercise in extreme heat or humidity, or at high altitudes.  Wear low-heel, comfortable shoes.  Practice good posture.  You may continue to have sex unless your health care provider tells you otherwise. Relieving pain and discomfort  Take frequent breaks and rest with your legs elevated if you have leg cramps or low back pain.  Take warm sitz baths to soothe any pain or discomfort caused by hemorrhoids. Use hemorrhoid cream if your health care provider approves.  Wear a good support bra to prevent discomfort from breast tenderness.  If you develop varicose veins: ? Wear support pantyhose or compression stockings as told by your healthcare provider. ? Elevate your feet for 15 minutes, 3-4 times a day. Prenatal care  Write down your questions. Take them to your prenatal visits.  Keep all your prenatal visits as told by your health care provider. This is important. Safety  Wear your seat belt at all times when driving.  Make  a list of emergency phone numbers, including numbers for family, friends, the hospital, and police and fire departments. General instructions  Avoid cat litter boxes and soil used by cats. These carry germs that can cause birth defects in the baby. If you have a cat, ask someone to clean the litter box for you.  Do not travel far distances unless it is absolutely necessary and only with the approval of your health care provider.  Do not use hot tubs, steam rooms, or saunas.  Do not drink alcohol.  Do not use any products that contain nicotine or tobacco, such as cigarettes and e-cigarettes. If you need help quitting, ask your health care provider.  Do not use any medicinal herbs or unprescribed drugs. These chemicals affect the formation and growth of the baby.  Do not douche or use tampons or scented sanitary pads.  Do not cross your legs for long periods of time.  To prepare for the arrival of your baby: ? Take prenatal classes to understand, practice, and ask questions about labor and delivery. ? Make a trial run to the hospital. ? Visit the hospital and tour the maternity area. ? Arrange for maternity or paternity leave through employers. ? Arrange for family and friends to take care of pets while you are in the hospital. ? Purchase a rear-facing car seat and make sure you know how to install it in your car. ? Pack your hospital bag. ? Prepare the baby's nursery. Make sure to remove all pillows and stuffed animals from the baby's crib to prevent suffocation.  Visit your dentist if you have not gone during your pregnancy. Use a soft toothbrush to brush your teeth and be gentle when you floss. Contact a health care provider if:  You are unsure if you are in labor or if your water has broken.  You become dizzy.  You have mild pelvic cramps, pelvic pressure, or nagging pain in your abdominal area.  You have lower back pain.  You have persistent nausea, vomiting, or  diarrhea.  You have an unusual or bad smelling vaginal discharge.  You have pain when you urinate. Get help right away if:  Your water breaks before 37 weeks.  You have regular contractions less than 5 minutes apart before 37 weeks.  You have a fever.  You are leaking fluid from your vagina.  You have spotting or bleeding from your vagina.  You have severe abdominal pain or cramping.  You have rapid weight loss or weight gain.  You have   shortness of breath with chest pain.  You notice sudden or extreme swelling of your face, hands, ankles, feet, or legs.  Your baby makes fewer than 10 movements in 2 hours.  You have severe headaches that do not go away when you take medicine.  You have vision changes. Summary  The third trimester is from week 28 through week 40, months 7 through 9. The third trimester is a time when the unborn baby (fetus) is growing rapidly.  During the third trimester, your discomfort may increase as you and your baby continue to gain weight. You may have abdominal, leg, and back pain, sleeping problems, and an increased need to urinate.  During the third trimester your breasts will keep growing and they will continue to become tender. A yellow fluid (colostrum) may leak from your breasts. This is the first milk you are producing for your baby.  False labor is a condition in which you feel small, irregular tightenings of the muscles in the womb (contractions) that eventually go away. These are called Braxton Hicks contractions. Contractions may last for hours, days, or even weeks before true labor sets in.  Signs of labor can include: abdominal cramps; regular contractions that start at 10 minutes apart and become stronger and more frequent with time; watery or bloody mucus discharge that comes from the vagina; increased pelvic pressure and dull back pain; and leaking of amniotic fluid. This information is not intended to replace advice given to you by your  health care provider. Make sure you discuss any questions you have with your health care provider. Document Revised: 04/18/2018 Document Reviewed: 02/01/2016 [employer] Patient Education  2020 [employer]  

## 2019-02-04 NOTE — Progress Notes (Signed)
Subjective:  Latoya Stark is a 24 y.o. G4P0030 at [redacted]w[redacted]d being seen today for ongoing prenatal care.  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  Patient reports general discomforts of pregnancy.  Contractions: Irregular. Vag. Bleeding: None.  Movement: Present. Denies leaking of fluid.   The following portions of the patient's history were reviewed and updated as appropriate: allergies, current medications, past family history, past medical history, past social history, past surgical history and problem list. Problem list updated.  Objective:   Vitals:   02/04/19 1426  BP: 127/79  Pulse: (!) 111  Weight: (!) 314 lb (142.4 kg)    Fetal Status: Fetal Heart Rate (bpm): 130   Movement: Present     General:  Alert, oriented and cooperative. Patient is in no acute distress.  Skin: Skin is warm and dry. No rash noted.   Cardiovascular: Normal heart rate noted  Respiratory: Normal respiratory effort, no problems with respiration noted  Abdomen: Soft, gravid, appropriate for gestational age. Pain/Pressure: Present     Pelvic:  Cervical exam deferred        Extremities: Normal range of motion.     Mental Status: Normal mood and affect. Normal behavior. Normal judgment and thought content.   Urinalysis:      Assessment and Plan:  Pregnancy: G4P0030 at [redacted]w[redacted]d  1. Supervision of high risk pregnancy, antepartum Stable GBS next visit Growth scan today, 96%, F/U PRN  2. Seizure disorder during pregnancy in third trimester ([place]) Stable No seizure Continue with Keppra Appt with neurology next month, per pt  Preterm labor symptoms and general obstetric  precautions including but not limited to vaginal bleeding, contractions, leaking of fluid and fetal movement were reviewed in detail with the patient. Please refer to After Visit Summary for other counseling recommendations.  Return in about 2 weeks (around 02/18/2019) for OB visit, face to face for GBS. MD provider.   Chancy Milroy, MD

## 2019-02-09 ENCOUNTER — Inpatient Hospital Stay (HOSPITAL_COMMUNITY): Payer: Medicaid Other

## 2019-02-09 ENCOUNTER — Inpatient Hospital Stay (HOSPITAL_COMMUNITY)
Admission: AD | Admit: 2019-02-09 | Discharge: 2019-02-12 | DRG: 833 | Disposition: A | Discharge status: Home / Self Care | Payer: Medicaid Other | Attending: Obstetrics and Gynecology | Admitting: Obstetrics and Gynecology

## 2019-02-09 DIAGNOSIS — O99353 Diseases of the nervous system complicating pregnancy, third trimester: Principal | ICD-10-CM | POA: Diagnosis present

## 2019-02-09 DIAGNOSIS — E669 Obesity, unspecified: Secondary | ICD-10-CM | POA: Diagnosis present

## 2019-02-09 DIAGNOSIS — G40409 Other generalized epilepsy and epileptic syndromes, not intractable, without status epilepticus: Secondary | ICD-10-CM | POA: Diagnosis present

## 2019-02-09 DIAGNOSIS — J45909 Unspecified asthma, uncomplicated: Secondary | ICD-10-CM | POA: Diagnosis present

## 2019-02-09 DIAGNOSIS — O99343 Other mental disorders complicating pregnancy, third trimester: Secondary | ICD-10-CM | POA: Diagnosis present

## 2019-02-09 DIAGNOSIS — O99213 Obesity complicating pregnancy, third trimester: Secondary | ICD-10-CM | POA: Diagnosis present

## 2019-02-09 DIAGNOSIS — O99513 Diseases of the respiratory system complicating pregnancy, third trimester: Secondary | ICD-10-CM | POA: Diagnosis present

## 2019-02-09 DIAGNOSIS — Z3A36 36 weeks gestation of pregnancy: Secondary | ICD-10-CM

## 2019-02-09 DIAGNOSIS — G40909 Epilepsy, unspecified, not intractable, without status epilepticus: Secondary | ICD-10-CM

## 2019-02-09 DIAGNOSIS — Z880 Allergy status to penicillin: Secondary | ICD-10-CM

## 2019-02-09 DIAGNOSIS — O99350 Diseases of the nervous system complicating pregnancy, unspecified trimester: Secondary | ICD-10-CM

## 2019-02-09 DIAGNOSIS — Z20822 Contact with and (suspected) exposure to covid-19: Secondary | ICD-10-CM | POA: Diagnosis present

## 2019-02-09 DIAGNOSIS — Z87898 Personal history of other specified conditions: Secondary | ICD-10-CM

## 2019-02-09 DIAGNOSIS — G8384 Todd's paralysis (postepileptic): Secondary | ICD-10-CM | POA: Diagnosis present

## 2019-02-09 DIAGNOSIS — F449 Dissociative and conversion disorder, unspecified: Secondary | ICD-10-CM

## 2019-02-09 DIAGNOSIS — F444 Conversion disorder with motor symptom or deficit: Secondary | ICD-10-CM | POA: Diagnosis present

## 2019-02-09 DIAGNOSIS — O99210 Obesity complicating pregnancy, unspecified trimester: Secondary | ICD-10-CM | POA: Diagnosis present

## 2019-02-09 LAB — CBC WITH DIFFERENTIAL/PLATELET
Abs Immature Granulocytes: 0.06 10*3/uL (ref 0.00–0.07)
Basophils Absolute: 0 10*3/uL (ref 0.0–0.1)
Basophils Relative: 0 %
Eosinophils Absolute: 0.1 10*3/uL (ref 0.0–0.5)
Eosinophils Relative: 1 %
HCT: 34.6 % — ABNORMAL LOW (ref 36.0–46.0)
Hemoglobin: 11.1 g/dL — ABNORMAL LOW (ref 12.0–15.0)
Immature Granulocytes: 1 %
Lymphocytes Relative: 17 %
Lymphs Abs: 1.2 10*3/uL (ref 0.7–4.0)
MCH: 27.9 pg (ref 26.0–34.0)
MCHC: 32.1 g/dL (ref 30.0–36.0)
MCV: 86.9 fL (ref 80.0–100.0)
Monocytes Absolute: 0.7 10*3/uL (ref 0.1–1.0)
Monocytes Relative: 10 %
Neutro Abs: 5.1 10*3/uL (ref 1.7–7.7)
Neutrophils Relative %: 71 %
Platelets: 285 10*3/uL (ref 150–400)
RBC: 3.98 MIL/uL (ref 3.87–5.11)
RDW: 13.2 % (ref 11.5–15.5)
WBC: 7.1 10*3/uL (ref 4.0–10.5)
nRBC: 0 % (ref 0.0–0.2)

## 2019-02-09 LAB — URINALYSIS, ROUTINE W REFLEX MICROSCOPIC
Bacteria, UA: NONE SEEN
Bilirubin Urine: NEGATIVE
Glucose, UA: NEGATIVE mg/dL
Hgb urine dipstick: NEGATIVE
Ketones, ur: NEGATIVE mg/dL
Nitrite: NEGATIVE
Protein, ur: NEGATIVE mg/dL
Specific Gravity, Urine: 1.016 (ref 1.005–1.030)
pH: 6 (ref 5.0–8.0)

## 2019-02-09 LAB — COMPREHENSIVE METABOLIC PANEL
ALT: 21 U/L (ref 0–44)
AST: 22 U/L (ref 15–41)
Albumin: 2.8 g/dL — ABNORMAL LOW (ref 3.5–5.0)
Alkaline Phosphatase: 154 U/L — ABNORMAL HIGH (ref 38–126)
Anion gap: 9 (ref 5–15)
BUN: 5 mg/dL — ABNORMAL LOW (ref 6–20)
CO2: 20 mmol/L — ABNORMAL LOW (ref 22–32)
Calcium: 8.9 mg/dL (ref 8.9–10.3)
Chloride: 105 mmol/L (ref 98–111)
Creatinine, Ser: 0.51 mg/dL (ref 0.44–1.00)
GFR calc Af Amer: >60 mL/min (ref >60)
GFR calc non Af Amer: >60 mL/min (ref >60)
Glucose, Bld: 95 mg/dL (ref 70–99)
Potassium: 3.8 mmol/L (ref 3.5–5.1)
Sodium: 134 mmol/L — ABNORMAL LOW (ref 135–145)
Total Bilirubin: 0.4 mg/dL (ref 0.3–1.2)
Total Protein: 6.1 g/dL — ABNORMAL LOW (ref 6.5–8.1)

## 2019-02-09 LAB — PROTEIN / CREATININE RATIO, URINE
Creatinine, Urine: 127.52 mg/dL
Protein Creatinine Ratio: 0.1 mg/mg{Cre} (ref 0.00–0.15)
Total Protein, Urine: 13 mg/dL

## 2019-02-09 LAB — GLUCOSE, CAPILLARY: Glucose-Capillary: 63 mg/dL — ABNORMAL LOW (ref 70–99)

## 2019-02-09 MED ORDER — CYCLOBENZAPRINE HCL 10 MG PO TABS
10.0000 mg | ORAL_TABLET | Freq: Once | ORAL | Status: AC
  Administered 2019-02-09: 10 mg via ORAL
  Filled 2019-02-09: qty 1

## 2019-02-09 MED ORDER — LEVETIRACETAM IN NACL 1500 MG/100ML IV SOLN
1500.0000 mg | Freq: Once | INTRAVENOUS | Status: AC
  Administered 2019-02-09: 1500 mg via INTRAVENOUS
  Filled 2019-02-09: qty 100

## 2019-02-09 MED ORDER — LACTATED RINGERS IV SOLN: INTRAVENOUS | Status: DC

## 2019-02-09 MED ORDER — LEVETIRACETAM 750 MG PO TABS
1500.0000 mg | ORAL_TABLET | Freq: Once | ORAL | Status: AC
  Administered 2019-02-09: 1500 mg via ORAL
  Filled 2019-02-09: qty 2

## 2019-02-09 NOTE — MAU Provider Note (Addendum)
History    CSN: [id number]  Arrival date and time: 02/09/19 1141   None   Chief Complaint  Patient presents with  . Seizures   HPI Patient Latoya Stark is a 24 y.o. G4P0030 at [redacted]w[redacted]d here via EMS after two witnessed seizures at home between 11:15 and 11:30 this morning. Upon arrival on unit patient was non-responsive and on room air. MD aware and waiting for patient on unit; house coverage, CNM and RNs present upon arrival on unit.   11:44: Patient's VSS; blood pressure normotensive, FH monitoring reassuring with FHR of 125 with accelerations,  Moderate variability, no decelerations and no contractions.   Patient Vitals for the past 24 hrs:  BP Pulse Resp SpO2  02/09/19 1209 131/76 (!) 102 -- --  02/09/19 1153 123/72 96 -- --  02/09/19 1140 125/75 (!) 113 20 98 %     Phone call to FOB at 1155 am yields the following:  Patient and FOB woke up around 10:30 am; had a "normal morning". Patient had no complaints of HA, blurry vision, floating spots, RUQ pain. FOB reported the baby as "active" this morning. Patient asked for her medicine bag from [facility] to take her pills, and FOB handed to her, this was around 11/11:15. Patient was sitting on the floor at that time. FOB walked to kitchen, heard a thud and came back and she was "jerking and shaking" on the floor. FOB does not know if she hit her head.  She became responsive after 10 minutes, and declined to go to the hospital but FOB convinced her to call ambulance. Patient then had 2nd seizure which was witnessed by EMS sometime before 11:30.   FOB does not know if patient took her pills today, and does not know what medicines those were. He states that her last seizure was 3-4 months ago, and that it usually takes her 5-10 minutes to "come around" afterwards.   EMS states that she received 4 mg bolus of MagSo4 prior to arrival; she did not receive dilantin as she stopped seizing. She was placed on oxygen initially but saturation was above 97  so was d/c. Patient remained non-responsive while on EMS truck.  OB History    Gravida  4   Para      Term      Preterm      AB  3   Living        SAB  3   TAB      Ectopic      Multiple      Live Births              Past Medical History:  Diagnosis Date  . ADHD (attention deficit hyperactivity disorder)   . Anemia   . Anxiety   . Asthma   . Chronic migraine 08/01/2018  . Depression   . Gonorrhea in female 10/05/2013  . 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 Headache   . Obesity   . Seizures ([place])    "when I get too hot"  . Trichomonas infection   . UTI (lower urinary tract infection)     Past Surgical History:  Procedure Laterality Date  . ADENOIDECTOMY    . DILATION AND EVACUATION N/A 08/09/2013   Procedure: DILATATION AND EVACUATION;  Surgeon: Frederico Hamman, MD;  Location: [facility];  Service: Gynecology;  Laterality: N/A;  . ORTHOPEDIC SURGERY Left    left hand  . TONSILLECTOMY      Family  History  Problem Relation Age of Onset  . Seizures Mother   . Stroke Mother   . Hypertension Father   . Diabetes Maternal Grandmother   . Colon cancer Maternal Grandmother   . Hypertension Maternal Grandmother   . Clotting disorder Maternal Grandmother   . Breast cancer Maternal Grandmother   . Stroke Maternal Grandmother   . Diabetes Paternal Grandmother   . Seizures Maternal Aunt     Social History   Tobacco Use  . Smoking status: Never Smoker  . Smokeless tobacco: Never Used  Substance Use Topics  . Alcohol use: No    Alcohol/week: 0.0 standard drinks  . Drug use: No    Allergies:  Allergies  Allergen Reactions  . Milk-Related Compounds Dermatitis    Breaks face out *can have 2% milk*  . Mushroom Extract Complex Anaphylaxis  . Penicillins Anaphylaxis    Has patient had a PCN reaction causing immediate rash, facial/tongue/throat swelling, SOB or lightheadedness with hypotension: Yes Has patient had a PCN reaction causing severe  rash involving mucus membranes or skin necrosis: No Has patient had a PCN reaction that required hospitalization No Has patient had a PCN reaction occurring within the last 10 years: Yes If all of the above answers are "NO", then may proceed with Cephalosporin use.   [name] Other (See Comments)    Breaks face out  . Latex Itching and Swelling  . Sulfa Antibiotics Swelling  . Vicodin [Hydrocodone-Acetaminophen] Hives    Medications Prior to Admission  Medication Sig Dispense Refill Last Dose  . acetaminophen (TYLENOL) 500 MG tablet Take 1,000 mg by mouth every 6 (six) hours as needed for headache.     . albuterol (PROVENTIL HFA;VENTOLIN HFA) 108 (90 Base) MCG/ACT inhaler Inhale 2 puffs into the lungs every 6 (six) hours as needed for wheezing or shortness of breath. 1 Inhaler 0   . aspirin EC 81 MG tablet Take 1 tablet (81 mg total) by mouth daily. Take after 12 weeks for prevention of preeclampsia later in pregnancy 300 tablet 2   . Blood Pressure Monitoring (BLOOD PRESSURE CUFF) MISC 1 Device by Does not apply route once a week. 1 each 0   . cyclobenzaprine (FLEXERIL) 10 MG tablet Take 1 tablet (10 mg total) by mouth 3 (three) times daily as needed for muscle spasms (back pain). 30 tablet 0   . docusate sodium (COLACE) 100 MG capsule Take 1 capsule (100 mg total) by mouth 2 (two) times daily. 60 capsule 2   . Doxylamine-Pyridoxine (DICLEGIS) 10-10 MG TBEC Take 2 tablets by mouth at bedtime. If symptoms persist, add one tablet in the morning and one in the afternoon 100 tablet 5   . Elastic Bandages & Supports (COMFORT FIT MATERNITY SUPP LG) MISC 1 application by Does not apply route daily. 1 each 0   . folic acid (FOLVITE) 1 MG tablet Take 1 tablet (1 mg total) by mouth daily. 30 tablet 10   . levETIRAcetam (KEPPRA) 750 MG tablet Take 2 tablets (1,500 mg total) by mouth 2 (two) times daily. 120 tablet 2   . metoCLOPramide (REGLAN) 5 MG tablet Take 1 tablet (5 mg total) by mouth 3 (three)  times daily. 90 tablet 1   . ondansetron (ZOFRAN ODT) 4 MG disintegrating tablet Take 1 tablet (4 mg total) by mouth every 8 (eight) hours as needed for nausea or vomiting. 20 tablet 0   . pantoprazole (PROTONIX) 40 MG tablet Take 1 tablet (40 mg total) by mouth  daily. 30 tablet 1   . polyethylene glycol (MIRALAX / GLYCOLAX) 17 g packet Take 17 g by mouth 2 (two) times daily. 24 each 3   . Prenat w/o A-FeCbn-Meth-FA-DHA (PRENATE MINI) 29-0.6-0.4-350 MG CAPS Take 1 capsule by mouth daily before breakfast. 30 capsule 11   . Promethazine HCl (PHENERGAN PO) Take by mouth.     . senna (SENOKOT) 8.6 MG TABS tablet Take 1 tablet (8.6 mg total) by mouth daily. 120 tablet 0     Review of Systems  Constitutional: Negative.   HENT: Negative.   Eyes: Negative.   Respiratory: Negative.   Cardiovascular: Negative.   Gastrointestinal: Negative.   Endocrine: Negative.   Genitourinary: Negative.   Musculoskeletal: Positive for back pain and gait problem (unable to feel or bear weight on RT leg).  Skin: Negative.   Neurological: Positive for seizures and headaches.  Hematological: Negative.   Psychiatric/Behavioral: Negative.    Physical Exam   Patient Vitals for the past 24 hrs:  BP Temp Temp src Pulse Resp SpO2  02/09/19 1511 -- 97.6 F (36.4 C) Oral -- -- --  02/09/19 1500 115/69 -- -- 94 -- --  02/09/19 1430 114/72 -- -- 97 19 --  02/09/19 1401 (!) 102/51 -- -- (!) 124 -- --  02/09/19 1246 126/67 -- -- (!) 102 20 97 %  02/09/19 1216 126/65 -- -- (!) 101 -- --  02/09/19 1209 131/76 -- -- (!) 102 -- --  02/09/19 1153 123/72 -- -- 96 -- --  02/09/19 1140 125/75 -- -- (!) 113 20 98 %    Physical Exam  Constitutional: She is oriented to person, place, and time. She appears well-developed.  HENT:  Head: Normocephalic.  Respiratory: Effort normal.  GI: Soft.  Musculoskeletal:        General: Normal range of motion.     Cervical back: Normal range of motion.  Neurological: She is alert and  oriented to person, place, and time.  Patient is unable to lift her right leg; has no reaction to pain, touch, pressure on right thigh or calf.   Skin: Skin is warm and dry.  Cervix is long, closed, thick  MAU Course  Procedures  -Patient immediately placed on fetal monitoring; labs drawn, will send for CBC, CMP, Protein Creatine to check for pre-e, although BPs normo-tensive here.  -will do head CT due to unable to rule out head injury -FOB updated on patients' status and plan. He is en route to bring her cell phone and charger and will wait by the bedside. 1543: Patient's CT is negative and she is requesting food; will check CBG and give snack. If she is feeling better, plan for discharge per Dr. Harolyn Rutherford.    Patient's VSS, but she appears lethargic. Difficulty moving extremities, neurology paged at 1725 and again at 1750; neurology will round on patient.   NST continues to be reassuring; 130 bpm, mod var, present acel, neg decels, no contractions. ; VSS.   1815: patient is now more alert and oriented but reports that she cannot walk and has no feeling on her right leg, patient does not feel pinching and touch sensation on her right leg. Eating a normal diet.    1845: neurology at the bedside. Patient now stating that she missed her PM dose of Keppra on 1/30. Neurology (Dr. Lorraine Lax) recommends MRI.  2000: Waiting for MRI 2028: will give Keppra 1500 mg PO now (PO dose).   Mervyn Skeeters Koo[place]ra 02/09/2019, 12:12 PM  Care assumed from Maye Hides, CNM @ 2000  2230: Patient returns from MRI , results pending  Patient is not willing to wait for results and for MAU provider to consult with neurologist about MRI results. She states to Verdell Carmine, RN that she does not want to wait and she is ready to go now, her back is hurting, she can't get comfortable in the bed and she has been here for hours and is ready to go."  RN to get patient to sign AMA form.  After MAU provider  speaking with patient @ 2245, she expressed being "tired of going to all kinds of doctors since she was 24 yrs old and no one knows what is going on with her. I've been here for 12 hours and you are just going to tell me the same thing; that you don't know what is wrong with me. Why is my back hurting? I have headaches everyday since I was 24 yrs old? And Why are my seizures picking up in frequency?"  I offered comfort measures to patient and verbalized understanding of her frustrations with medical providers not finding out what is wrong with her. Advised her that her RT leg paralysis is not an OB related complication and it is hard for [place] to tell her why it is happening.  Patient is willing to wait for MRI results and after MAU provider speaks to the neurologist.  2336: MRI results finalized. Page placed to Dr. Chauncey Cruel. [place].   2342: Return page from Dr. Lorraine Lax - notified of MRI results and no improvement in patient's RT leg mobility. Dr. Arrie Eastern recommendation is to admit patient for observation and he would have PT consult with her 02/10/2019.   2350: Dr. Lorraine Lax at bedside. Advised that Dr. Harolyn Rutherford wants to speak to him. Dr. Harolyn Rutherford notified that Dr. Lorraine Lax is at bedside.  0003: Dr. Harolyn Rutherford and Dr. Lorraine Lax discussing plan for observation admission.  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

## 2019-02-09 NOTE — MAU Note (Signed)
Per Maye Hides, CNM pt can be saline locked.  Pt transported to Radiology for MRI.

## 2019-02-09 NOTE — MAU Note (Addendum)
Latoya Stark is a 24 y.o. at [redacted]w[redacted]d here in MAU reporting:   +seizures  Arrived via ems. EMS reports she received 4grams of magnesium sulfate 20G saline lock noted in Left wrist  Patient only responsive to pain on arrival.  Attending and CNM bedside. Seizure pads placed on bed. Side rails up. Verbal Labs drawn. Vitals:   02/09/19 1140 02/09/19 1153  BP: 125/75 123/72  Pulse: (!) 113 96  Resp: 20   SpO2: 98%      FHT: 120 via efm

## 2019-02-09 NOTE — MAU Note (Signed)
Patient awake and communicating with RN.  Reports both head and lower left abdominal pain. CNM aware.

## 2019-02-09 NOTE — Consult Note (Addendum)
Requesting Physician: Dr. Dickie La    Chief Complaint: Seizure  History obtained from: Patient and Chart    HPI:                                                                                                                                       Latoya Stark is a 24 y.o. female [redacted] weeks pregnant with history of epilepsy on Keppra, obesity, anxiety, chronic migraines  presents to the MAU after having 2 witnessed seizures. Patient woke up normal this morning and was about to take her seizure medication when her husband/father of baby heard a thud and saw the patient having a seizure.  He describes it as generalized shaking lasting for a few minutes.  There was no tongue bite or urine incontinence, unaware if there was any gaze deviation.  She became responsive after 10 minutes and declined to go to the hospital but he still called an ambulance.  EMS witnessed a second seizure around 11:30 AM.  The patient was very somnolent on arrival to the MAU however is now alert and awake and answering all questions.  She received 4 mg of magnesium sulfate prior to arrival.  She  also received a loading dose of Keppra 1.5 g IV.  On assessment, patient is awake and oriented.  She does have significant weakness in her right leg which is new for her and denies having previous history of Todd's paralysis with seizures.  She admits to missing a dose of seizure medication last night as well and had a seizure just prior to taking her morning dose of Keppra.    Past Medical History:  Diagnosis Date  . ADHD (attention deficit hyperactivity disorder)   . Anemia   . Anxiety   . Asthma   . Chronic migraine 08/01/2018  . Depression   . Gonorrhea in female 10/05/2013  . 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 Headache   . Obesity   . Seizures ([place])    "when I get too hot"  . Trichomonas infection   . UTI (lower urinary tract infection)     Past Surgical History:  Procedure Laterality Date  .  ADENOIDECTOMY    . DILATION AND EVACUATION N/A 08/09/2013   Procedure: DILATATION AND EVACUATION;  Surgeon: Frederico Hamman, MD;  Location: [facility];  Service: Gynecology;  Laterality: N/A;  . ORTHOPEDIC SURGERY Left    left hand  . TONSILLECTOMY      Family History  Problem Relation Age of Onset  . Seizures Mother   . Stroke Mother   . Hypertension Father   . Diabetes Maternal Grandmother   . Colon cancer Maternal Grandmother   . Hypertension Maternal Grandmother   . Clotting disorder Maternal Grandmother   . Breast cancer Maternal Grandmother   . Stroke Maternal Grandmother   . Diabetes Paternal Grandmother   . Seizures Maternal Aunt  Social History:  reports that she has never smoked. She has never used smokeless tobacco. She reports that she does not drink alcohol or use drugs.  Allergies:  Allergies  Allergen Reactions  . Milk-Related Compounds Dermatitis    Breaks face out *can have 2% milk*  . Mushroom Extract Complex Anaphylaxis  . Penicillins Anaphylaxis    Has patient had a PCN reaction causing immediate rash, facial/tongue/throat swelling, SOB or lightheadedness with hypotension: Yes Has patient had a PCN reaction causing severe rash involving mucus membranes or skin necrosis: No Has patient had a PCN reaction that required hospitalization No Has patient had a PCN reaction occurring within the last 10 years: Yes If all of the above answers are "NO", then may proceed with Cephalosporin use.   [name] Other (See Comments)    Breaks face out  . Latex Itching and Swelling  . Sulfa Antibiotics Swelling  . Vicodin [Hydrocodone-Acetaminophen] Hives    Medications:                                                                                                                        I reviewed home medications   ROS:                                                                                                                                     14 systems  reviewed and negative except above    Examination:                                                                                                      General: Appears well-developed. Psych: Affect appropriate to situation Eyes: No scleral injection HENT: No OP obstrucion Head: Normocephalic.  Cardiovascular: Normal rate and regular rhythm. Respiratory: Effort normal and breath sounds normal to anterior ascultation GI: Soft.  No distension. There is no tenderness.  Skin: WDI    Neurological Examination Mental Status: Alert, oriented, thought content appropriate.  Speech fluent without evidence of  aphasia. Able to follow 3 step commands without difficulty. Cranial Nerves: II: Visual fields grossly normal,  III,IV, VI: ptosis not present, extra-ocular motions intact bilaterally, pupils equal, round, reactive to light and accommodation V,VII: smile symmetric, facial light touch sensation normal bilaterally VIII: hearing normal bilaterally IX,X: uvula rises symmetrically XI: bilateral shoulder shrug XII: midline tongue extension Motor: Right : Upper extremity   5/5    Left:     Upper extremity   5/5  Lower extremity   0/5 (possibly 2/2 poor effort)   lower extremity   5/5 Tone and bulk:normal tone throughout; no atrophy noted Sensory: No sensation to light touch/pain in the right leg, normal sensation of the right upper extremity Reflexes: 2+ reflexes bilaterally in both plantars, 2+ both ankles Plantars: Right: downgoing   Left: downgoing Cerebellar: normal finger-to-nose normal    Lab Results: Basic Metabolic Panel: Recent Labs  Lab 02/09/19 1147  NA 134*  K 3.8  CL 105  CO2 20*  GLUCOSE 95  BUN 5*  CREATININE 0.51  CALCIUM 8.9    CBC: Recent Labs  Lab 02/09/19 1147  WBC 7.1  NEUTROABS 5.1  HGB 11.1*  HCT 34.6*  MCV 86.9  PLT 285    Coagulation Studies: No results for input(s): LABPROT, INR in the last 72 hours.  Imaging: CT Head Wo  Contrast  Result Date: 02/09/2019 CLINICAL DATA:  Multiple seizures.  Altered mental status. EXAM: CT HEAD WITHOUT CONTRAST TECHNIQUE: Contiguous axial images were obtained from the base of the skull through the vertex without intravenous contrast. COMPARISON:  December 16, 2015 FINDINGS: Brain: No evidence of acute infarction, hemorrhage, hydrocephalus, extra-axial collection or mass lesion/mass effect. Vascular: No hyperdense vessel or unexpected calcification. Skull: Normal. Negative for fracture or focal lesion. Sinuses/Orbits: No acute finding. Other: None. IMPRESSION: Normal brain. Electronically Signed   By: Dorise Bullion III M.D   On: 02/09/2019 13:29     ASSESSMENT AND PLAN    24 y.o. female [redacted] weeks pregnant with history of epilepsy on Keppra, obesity, anxiety, chronic migraines  presents to the MAU after having 2 witnessed seizures.  Likely due to missed dose of medication.  Patient is back to her baseline except for right leg weakness.  Regarding right leg weakness, patient able to sustain flexed position as well as when asking her to keep both legs closed in order to test adductor on the left leg, has 5/5 in the right leg as well.  Her tone is normal and reflexes symmetric, making this less likely spinal cord pathology.  Suspect nonorganic cause versus Todd's paralysis  Seizure secondary to missed dose of AED Right leg weakness, suspect Todd's paralysis versus conversion   Recommendations Continue Keppra 1.5 g twice daily, no need to increase dose as this was secondary to missed medication Seizure precautions including no driving for 6 months Obtain MRI brain as well as MR venogram normal, no acute findings PT OT evaluation tomorrow morning If patient still has not improved, MRI of the T and L-spine.    Serjio Deupree [facility] Pager Number [phone]

## 2019-02-09 NOTE — MAU Note (Signed)
CBG (last 3)   Recent Labs    02/09/19 1544  GLUCAP 63*   Patient given apple juice. Upon entry RN observed patient eating a chocolate chip cookie from [employer].  Maye Hides CNM in department and made aware of findings. No new orders received.

## 2019-02-09 NOTE — Progress Notes (Signed)
Was advised by Lysbeth Penner, CNM that pt can be d/c'd from [facility].

## 2019-02-09 NOTE — MAU Note (Signed)
Pt returns from MRI upset and reports that she is ready to go home.  She is tired, her back is hurting and because she is pregnant she can not take anything for the pain.  I encouraged pt that per provider we just need to wait until we get results back from the MRI, the provider will consult with Neurologist to make sure that she gets proper f/u. Notified Rolitta, CNM who recommends that if pt wants to go home she will need to sign AMA.  I explained to the pt that she would need to sign a form that she is going against medical advice.  Rolitta, CNM now at bedside.  Pt begins to cry and state "I have had seizures/headaches since I was 24 yrs old and all the doctors are not able to explain to why.  I lost my babies and no doctor was able to explain to me why then either.  With all the tests that has done no one has been able to give me any answers." Rolitta, CNM encouraged pt that it will take approximately an hour for results, she would need to consult with neurologist about MRI results, and then she would be able to go home.  Pt apologized for her attitude and agreed to stay for results and f/u.

## 2019-02-10 ENCOUNTER — Inpatient Hospital Stay (HOSPITAL_COMMUNITY): Payer: Medicaid Other

## 2019-02-10 ENCOUNTER — Other Ambulatory Visit: Payer: Self-pay

## 2019-02-10 ENCOUNTER — Encounter: Payer: Self-pay | Admitting: Obstetrics and Gynecology

## 2019-02-10 DIAGNOSIS — O99343 Other mental disorders complicating pregnancy, third trimester: Secondary | ICD-10-CM | POA: Diagnosis present

## 2019-02-10 DIAGNOSIS — Z3A36 36 weeks gestation of pregnancy: Secondary | ICD-10-CM | POA: Diagnosis not present

## 2019-02-10 DIAGNOSIS — F449 Dissociative and conversion disorder, unspecified: Secondary | ICD-10-CM

## 2019-02-10 DIAGNOSIS — Z880 Allergy status to penicillin: Secondary | ICD-10-CM | POA: Diagnosis not present

## 2019-02-10 DIAGNOSIS — G8384 Todd's paralysis (postepileptic): Secondary | ICD-10-CM | POA: Diagnosis present

## 2019-02-10 DIAGNOSIS — J45909 Unspecified asthma, uncomplicated: Secondary | ICD-10-CM | POA: Diagnosis present

## 2019-02-10 DIAGNOSIS — G40409 Other generalized epilepsy and epileptic syndromes, not intractable, without status epilepticus: Secondary | ICD-10-CM | POA: Diagnosis present

## 2019-02-10 DIAGNOSIS — O99353 Diseases of the nervous system complicating pregnancy, third trimester: Principal | ICD-10-CM

## 2019-02-10 DIAGNOSIS — O99513 Diseases of the respiratory system complicating pregnancy, third trimester: Secondary | ICD-10-CM | POA: Diagnosis present

## 2019-02-10 DIAGNOSIS — E669 Obesity, unspecified: Secondary | ICD-10-CM | POA: Diagnosis present

## 2019-02-10 DIAGNOSIS — O99210 Obesity complicating pregnancy, unspecified trimester: Secondary | ICD-10-CM | POA: Diagnosis not present

## 2019-02-10 DIAGNOSIS — Z20822 Contact with and (suspected) exposure to covid-19: Secondary | ICD-10-CM | POA: Diagnosis present

## 2019-02-10 DIAGNOSIS — G40909 Epilepsy, unspecified, not intractable, without status epilepticus: Secondary | ICD-10-CM | POA: Diagnosis not present

## 2019-02-10 DIAGNOSIS — F444 Conversion disorder with motor symptom or deficit: Secondary | ICD-10-CM | POA: Diagnosis present

## 2019-02-10 DIAGNOSIS — O99213 Obesity complicating pregnancy, third trimester: Secondary | ICD-10-CM | POA: Diagnosis present

## 2019-02-10 LAB — CBC WITH DIFFERENTIAL/PLATELET
Abs Immature Granulocytes: 0.05 10*3/uL (ref 0.00–0.07)
Basophils Absolute: 0 10*3/uL (ref 0.0–0.1)
Basophils Relative: 0 %
Eosinophils Absolute: 0.1 10*3/uL (ref 0.0–0.5)
Eosinophils Relative: 2 %
HCT: 32.6 % — ABNORMAL LOW (ref 36.0–46.0)
Hemoglobin: 10.3 g/dL — ABNORMAL LOW (ref 12.0–15.0)
Immature Granulocytes: 1 %
Lymphocytes Relative: 23 %
Lymphs Abs: 1.4 10*3/uL (ref 0.7–4.0)
MCH: 28.2 pg (ref 26.0–34.0)
MCHC: 31.6 g/dL (ref 30.0–36.0)
MCV: 89.3 fL (ref 80.0–100.0)
Monocytes Absolute: 0.6 10*3/uL (ref 0.1–1.0)
Monocytes Relative: 10 %
Neutro Abs: 4 10*3/uL (ref 1.7–7.7)
Neutrophils Relative %: 64 %
Platelets: 263 10*3/uL (ref 150–400)
RBC: 3.65 MIL/uL — ABNORMAL LOW (ref 3.87–5.11)
RDW: 13.4 % (ref 11.5–15.5)
WBC: 6.2 10*3/uL (ref 4.0–10.5)
nRBC: 0 % (ref 0.0–0.2)

## 2019-02-10 LAB — WET PREP, GENITAL
Clue Cells Wet Prep HPF POC: NONE SEEN
Sperm: NONE SEEN
Trich, Wet Prep: NONE SEEN
Yeast Wet Prep HPF POC: NONE SEEN

## 2019-02-10 LAB — COMPREHENSIVE METABOLIC PANEL
ALT: 19 U/L (ref 0–44)
AST: 20 U/L (ref 15–41)
Albumin: 2.6 g/dL — ABNORMAL LOW (ref 3.5–5.0)
Alkaline Phosphatase: 139 U/L — ABNORMAL HIGH (ref 38–126)
Anion gap: 11 (ref 5–15)
BUN: <5 mg/dL — ABNORMAL LOW (ref 6–20)
CO2: 22 mmol/L (ref 22–32)
Calcium: 9 mg/dL (ref 8.9–10.3)
Chloride: 104 mmol/L (ref 98–111)
Creatinine, Ser: 0.46 mg/dL (ref 0.44–1.00)
GFR calc Af Amer: >60 mL/min (ref >60)
GFR calc non Af Amer: >60 mL/min (ref >60)
Glucose, Bld: 125 mg/dL — ABNORMAL HIGH (ref 70–99)
Potassium: 3.8 mmol/L (ref 3.5–5.1)
Sodium: 137 mmol/L (ref 135–145)
Total Bilirubin: 0.4 mg/dL (ref 0.3–1.2)
Total Protein: 5.5 g/dL — ABNORMAL LOW (ref 6.5–8.1)

## 2019-02-10 LAB — SARS CORONAVIRUS 2 (TAT 6-24 HRS): SARS Coronavirus 2: NEGATIVE

## 2019-02-10 MED ORDER — ACETAMINOPHEN 325 MG PO TABS
650.0000 mg | ORAL_TABLET | ORAL | Status: DC | PRN
  Administered 2019-02-10 – 2019-02-12 (×5): 650 mg via ORAL
  Filled 2019-02-10 (×5): qty 2

## 2019-02-10 MED ORDER — ZOLPIDEM TARTRATE 5 MG PO TABS
5.0000 mg | ORAL_TABLET | Freq: Every evening | ORAL | Status: DC | PRN
  Administered 2019-02-12: 5 mg via ORAL
  Filled 2019-02-10: qty 1

## 2019-02-10 MED ORDER — LEVETIRACETAM 500 MG PO TABS
1500.0000 mg | ORAL_TABLET | Freq: Two times a day (BID) | ORAL | Status: DC
  Administered 2019-02-10 – 2019-02-12 (×5): 1500 mg via ORAL
  Filled 2019-02-10 (×5): qty 3

## 2019-02-10 MED ORDER — PANTOPRAZOLE SODIUM 40 MG PO TBEC
40.0000 mg | DELAYED_RELEASE_TABLET | Freq: Every day | ORAL | Status: DC
  Administered 2019-02-10 – 2019-02-12 (×3): 40 mg via ORAL
  Filled 2019-02-10 (×3): qty 1

## 2019-02-10 MED ORDER — FOLIC ACID 1 MG PO TABS
1.0000 mg | ORAL_TABLET | Freq: Every day | ORAL | Status: DC
  Administered 2019-02-10 – 2019-02-12 (×3): 1 mg via ORAL
  Filled 2019-02-10 (×3): qty 1

## 2019-02-10 MED ORDER — ALBUTEROL SULFATE (2.5 MG/3ML) 0.083% IN NEBU: 3.0000 mL | INHALATION_SOLUTION | Freq: Four times a day (QID) | RESPIRATORY_TRACT | Status: DC | PRN

## 2019-02-10 MED ORDER — ASPIRIN EC 81 MG PO TBEC
81.0000 mg | DELAYED_RELEASE_TABLET | Freq: Every day | ORAL | Status: DC
  Administered 2019-02-10 – 2019-02-12 (×3): 81 mg via ORAL
  Filled 2019-02-10 (×3): qty 1

## 2019-02-10 MED ORDER — CALCIUM CARBONATE ANTACID 500 MG PO CHEW: 2.0000 | CHEWABLE_TABLET | ORAL | Status: DC | PRN

## 2019-02-10 MED ORDER — PRENATAL MULTIVITAMIN CH
1.0000 | ORAL_TABLET | Freq: Every day | ORAL | Status: DC
  Administered 2019-02-10 – 2019-02-12 (×3): 1 via ORAL
  Filled 2019-02-10 (×3): qty 1

## 2019-02-10 MED ORDER — DOCUSATE SODIUM 100 MG PO CAPS
100.0000 mg | ORAL_CAPSULE | Freq: Two times a day (BID) | ORAL | Status: DC
  Administered 2019-02-10 – 2019-02-12 (×6): 100 mg via ORAL
  Filled 2019-02-10 (×6): qty 1

## 2019-02-10 NOTE — Evaluation (Addendum)
Physical Therapy Evaluation Patient Details Name: Latoya Stark MRN: [id number] DOB: 15-Jun-1995 Today's Date: 02/10/2019   History of Present Illness  Pt is 24 yo female who is [redacted] week gestation with pmh including chronic migraine, depression, seizures, and anxiety (see full PMH in [facility]).  Pt admitted after have 2 witness seizures with residual R leg paralysis.  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  MRI Brain : normal.  Per neuro PT eval this morning and MRI spine if not improving.  Spoke with RN and they have been getting pt up with [facility] and may not have MRI until late tonight or tomorrow  Clinical Impression   Pt admitted with above diagnosis. Pt with c/o R leg weakness and numbness.  Reports mild low back pain but is chronic and unchanged with this admission.  She was able to perform bed mobility with min A and stand with min guard using the [facility].  Pt does not have sensation or prioprioception on R LE (however, does seem to be improving as she reports can feel buttock at this time.)  Pt with decreased ability to consistently volitionally activate R LE but able to weight bearing minimally and maintain partial knee extension and knees bent in supine.  She did demonstrate 1/5 strength R ankle dorsiflexion 5 x - and reports this is an improvement.  Pt currently with functional limitations due to the deficits listed below (see PT Problem List). Pt will benefit from skilled PT to increase their independence and safety with mobility to allow discharge to the venue listed below.  At this time pt unsafe to return home and recommend CIR rehab as she is normally very independent and active.  Pt reports if improves but not able to go up steps could consider going to her mother's house that only has 3-5 steps.      Follow Up Recommendations CIR    Equipment Recommendations  Other (comment)(TBD by next venue or with progress)    Recommendations for  Other Services       Precautions / Restrictions Precautions Precautions: Fall Precaution Comments: seizure      Mobility  Bed Mobility Overal bed mobility: Needs Assistance Bed Mobility: Supine to Sit;Sit to Supine     Supine to sit: Min assist Sit to supine: Min assist   General bed mobility comments: Assist for R LE in and out of bed  Transfers Overall transfer level: Needs assistance   Transfers: Sit to/from Stand Sit to Stand: Min guard         General transfer comment: Sit to stand x 3; min A toielting ADLs  Ambulation/Gait             General Gait Details: Deferred due to R LE weakness; see pre gait activities  Stairs            Wheelchair Mobility    Modified Rankin (Stroke Patients Only)       Balance Overall balance assessment: Needs assistance Sitting-balance support: Feet supported;No upper extremity supported Sitting balance-Leahy Scale: Normal     Standing balance support: Single extremity supported;During functional activity Standing balance-Leahy Scale: Fair Standing balance comment: Performed standing and weight shifting in the [place].  Pt had difficulty with terminal knee ext on R and R foot with min supination but did not buckle.  Unable to fully WB on R LE, but was able to support some weight with quad activation.  Pertinent Vitals/Pain Pain Assessment: 0-10 Pain Score: 5  Pain Location: Low Back - chronic Pain Descriptors / Indicators: (no signs of pain) Pain Intervention(s): Limited activity within patient's tolerance    Home Living Family/patient expects to be discharged to:: Private residence Living Arrangements: Spouse/significant other Available Help at Discharge: Available PRN/intermittently(works at night) Type of Home: Apartment Home Access: Stairs to enter Entrance Stairs-Rails: Right;Left;Can reach both Entrance Stairs-Number of Steps: 28 Home Layout: One level Home  Equipment: None      Prior Function Level of Independence: Independent         Comments: Has low back pain with pregnancy but did not increase with recent admission.     Hand Dominance        Extremity/Trunk Assessment   Upper Extremity Assessment Upper Extremity Assessment: RUE deficits/detail;LUE deficits/detail RUE Deficits / Details: ROM WFL; Strength 4/5 (reports improvign, was weaker yesterday) RUE Sensation: WNL RUE Coordination: WNL LUE Deficits / Details: ROM WFL and Strength 5/5 LUE Sensation: WNL LUE Coordination: WNL    Lower Extremity Assessment Lower Extremity Assessment: LLE deficits/detail;RLE deficits/detail  RLE Deficits / Details:  Noted edema in R LE compared to L LE.   ROM WFL.   MMT: Pt demonstrating 1/5 strength in dorsiflexion but inconsistent.  She was unable to quad set when cued but did have some quad activation in standing and when leg held in extending position she could maintain partial ROM with ~20 degrees extensor lag.  She had no active hip strength when cued but able to keep leg in hooklying position.   RLE Sensation: decreased light touch;decreased proprioception(Pt with no  Sensation or proprioception (light touch or deep pressure) from groin to toes.  Reports last night was also in buttock but that has improved.) RLE Coordination: decreased fine motor;decreased gross motor LLE Deficits / Details: ROM WFL; Strength 5/5 LLE Sensation: WNL LLE Coordination: WNL    Cervical / Trunk Assessment Cervical / Trunk Assessment: Normal  Communication   Communication: No difficulties  Cognition Arousal/Alertness: Awake/alert Behavior During Therapy: WFL for tasks assessed/performed Overall Cognitive Status: Within Functional Limits for tasks assessed                                        General Comments  Encouraged to continue to try quad sets and ankle pumps, and AAROM of R LE as able.  Educated on proprioception and  effects on mobility.  Discussed recommendation for CIR.     Exercises General Exercises - Lower Extremity Ankle Circles/Pumps: AAROM;Right;15 reps;Supine Long Arc Quad: PROM;10 reps;Right;Seated   Assessment/Plan    PT Assessment Patient needs continued PT services  PT Problem List Decreased strength;Decreased mobility;Decreased coordination;Decreased balance;Decreased knowledge of use of DME;Impaired sensation       PT Treatment Interventions DME instruction;Therapeutic activities;Gait training;Therapeutic exercise;Patient/family education;Stair training;Balance training;Functional mobility training    PT Goals (Current goals can be found in the Care Plan section)  Acute Rehab PT Goals Patient Stated Goal: regain R leg strength; return home PT Goal Formulation: With patient Time For Goal Achievement: 02/24/19 Potential to Achieve Goals: Good    Frequency Min 4X/week   Barriers to discharge Inaccessible home environment;Decreased caregiver support      Co-evaluation               AM-PAC PT "6 Clicks" Mobility  Outcome Measure Help needed turning from your back to your side while  in a flat bed without using bedrails?: A Little Help needed moving from lying on your back to sitting on the side of a flat bed without using bedrails?: A Little Help needed moving to and from a bed to a chair (including a wheelchair)?: A Lot Help needed standing up from a chair using your arms (e.g., wheelchair or bedside chair)?: A Little Help needed to walk in hospital room?: Total Help needed climbing 3-5 steps with a railing? : Total 6 Click Score: 13    End of Session Equipment Utilized During Treatment: Other (comment)(St[facility]) Activity Tolerance: Patient tolerated treatment well Patient left: in bed;with call bell/phone within reach Nurse Communication: Mobility status PT Visit Diagnosis: Muscle weakness (generalized) (M62.81);Other abnormalities of gait and mobility (R26.89)     Time: [id number] PT Time Calculation (min) (ACUTE ONLY): 40 min   Charges:   PT Evaluation $PT Eval Moderate Complexity: 1 Mod          Maggie Font, PT Acute Rehab Services Pager [phone] [facility] [phone] [facility] [phone]   Latoya Stark 02/10/2019, 2:08 PM

## 2019-02-10 NOTE — Progress Notes (Signed)
NEUROLOGY PROGRESS NOTE   Subjective: Patient states the upper extremities are normal.  Still having no movement of right lower extremity.  Also stating that her left leg has more sensation than her right leg.  Exam: Vitals:   02/10/19 0629 02/10/19 0807  BP: (!) 95/47 (!) 103/52  Pulse: 98 100  Resp: 20 20  Temp: 98 F (36.7 C) 98.3 F (36.8 C)  SpO2: 97%     Physical Exam  Constitutional: Appears well-developed and well-nourished.  Psych: Affect appropriate to situation Eyes: No scleral injection HENT: No OP obstrucion Head: Normocephalic.  Cardiovascular: Normal rate and regular rhythm.  Respiratory: Effort normal, non-labored breathing GI: Soft.  No distension. There is no tenderness.  Skin: WDI   Neuro:  Mental Status: Alert, oriented, thought content appropriate.  Speech fluent without evidence of aphasia.  Able to follow 3 step commands without difficulty. Cranial Nerves: II:  Visual fields grossly normal,  III,IV, VI: ptosis not present, extra-ocular motions intact bilaterally pupils equal, round, reactive to light and accommodation V,VII: smile symmetric, facial light touch sensation normal bilaterally VIII: hearing normal bilaterally IX,X: Palate rises midline XI: bilateral shoulder shrug XII: midline tongue extension Motor: Right : Upper extremity   5/5    Left:     Upper extremity   5/5  Lower extremity   0/5     Lower extremity   5/5 -It should be noted that patient had positive Hoover's sign -It should be noted that when the patient pulled herself up to help me with exam she demonstrated 4/5 strength to push her RLE down and move herself up in the bed -Adductor's and abductors 5/5 to BLE Normal tone throughout; no atrophy noted Sensory: States she has decreased sensation on her right leg compared to her left leg Deep Tendon Reflexes: 2+ and symmetric throughout Plantars: Right: downgoing   Left: downgoing    Medications:  Scheduled: . aspirin EC   81 mg Oral Daily  . docusate sodium  100 mg Oral BID  . folic acid  1 mg Oral Daily  . levETIRAcetam  1,500 mg Oral BID  . pantoprazole  40 mg Oral Daily  . prenatal multivitamin  1 tablet Oral Q1200    Pertinent Labs/Diagnostics:  CT Head Wo Contrast Result Date: 02/09/2019 IMPRESSION: Normal brain. Electronically Signed   By: Dorise Bullion III M.D   On: 02/09/2019 13:29   MR BRAIN WO CONTRAST Result Date: 02/09/2019 . IMPRESSION: 1. Motion degraded exam. 2. Normal brain MRI.  No acute intracranial abnormality identified. 3. Normal intracranial MRV.  No evidence for dural sinus thrombosis. Electronically Signed   By: Jeannine Boga M.D.   On: 02/09/2019 23:25    Ryin Quinones PA-C [facility] [phone]  Assessment:  24 year old female, [redacted] weeks pregnant, with history of epilepsy on Keppra.  Patient had 2 witnessed seizures likely secondary to missed doses of medication.   1. Patient currently is still not back to baseline regarding RLE motor function, stating that she cannot move her right leg.  Exam is suggestive of nonorganic etiology.  However, we have seen Todd's paralysis in some instances lasting more than 1 day.   2. Will also need to obtain T-spine and L-spine MRI to confirm no structural etiology.  Recommendations: - Continue Keppra 1.5 g twice daily.  No need to increase dose as her seizure occurred in the context of missed medication -Seizure precautions -- MRI of T-spine and L-spine - PT/OT  Electronically signed: Dr. Kerney Elbe  02/10/2019, 8:36 AM

## 2019-02-10 NOTE — Progress Notes (Signed)
Rehab Admissions Coordinator Note:  Patient was screened by Cleatrice Burke for appropriateness for an Inpatient Acute Rehab Consult per PT recommendation. Noted workup ongoing. If you would like [place] to consider patient for admit, please place order for consult.  Cleatrice Burke RN MSN 02/10/2019, 3:03 PM  I can be reached at [phone].

## 2019-02-10 NOTE — H&P (Addendum)
History  CSN: [id number]   Arrival date and time: 02/09/19 1141    None       Chief Complaint  Patient presents with  . Seizures    HPI Patient Latoya Stark is a 24 y.o. G4P0030 at [redacted]w[redacted]d here via EMS after two witnessed seizures at home between 11:15 and 11:30 this morning. Upon arrival on unit patient was non-responsive and on room air. MD aware and waiting for patient on unit; house coverage, CNM and RNs present upon arrival on unit.    11:44: Patient's VSS; blood pressure normotensive, FH monitoring reassuring with FHR of 125 with accelerations,  Moderate variability, no decelerations and no contractions.    Patient Vitals for the past 24 hrs:   BP Pulse Resp SpO2  02/09/19 1209 131/76 (!) 102 -- --  02/09/19 1153 123/72 96 -- --  02/09/19 1140 125/75 (!) 113 20 98 %      Phone call to FOB at 1155 am yields the following:  Patient and FOB woke up around 10:30 am; had a "normal morning". Patient had no complaints of HA, blurry vision, floating spots, RUQ pain. FOB reported the baby as "active" this morning. Patient asked for her medicine bag from [facility] to take her pills, and FOB handed to her, this was around 11/11:15. Patient was sitting on the floor at that time. FOB walked to kitchen, heard a thud and came back and she was "jerking and shaking" on the floor. FOB does not know if she hit her head.  She became responsive after 10 minutes, and declined to go to the hospital but FOB convinced her to call ambulance. Patient then had 2nd seizure which was witnessed by EMS sometime before 11:30.    FOB does not know if patient took her pills today, and does not know what medicines those were. He states that her last seizure was 3-4 months ago, and that it usually takes her 5-10 minutes to "come around" afterwards.    EMS states that she received 4 mg bolus of MagSo4 prior to arrival; she did not receive dilantin as she stopped seizing. She was placed on oxygen initially but saturation was  above 97 so was d/c. Patient remained non-responsive while on EMS truck.          OB History     Gravida  4   Para      Term      Preterm      AB  3   Living         SAB  3   TAB      Ectopic      Multiple      Live Births                      Past Medical History:  Diagnosis Date  . ADHD (attention deficit hyperactivity disorder)    . Anemia    . Anxiety    . Asthma    . Chronic migraine 08/01/2018  . Depression    . Gonorrhea in female 10/05/2013  . 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[place]) 08/01/2018  . Headache    . Obesity    . Seizures ([place])      "when I get too hot"  . Trichomonas infection    . UTI (lower urinary tract infection)             Past Surgical History:  Procedure Laterality Date  . ADENOIDECTOMY      .  DILATION AND EVACUATION N/A 08/09/2013    Procedure: DILATATION AND EVACUATION;  Surgeon: Frederico Hamman, MD;  Location: [facility];  Service: Gynecology;  Laterality: N/A;  . ORTHOPEDIC SURGERY Left      left hand  . TONSILLECTOMY               Family History  Problem Relation Age of Onset  . Seizures Mother    . Stroke Mother    . Hypertension Father    . Diabetes Maternal Grandmother    . Colon cancer Maternal Grandmother    . Hypertension Maternal Grandmother    . Clotting disorder Maternal Grandmother    . Breast cancer Maternal Grandmother    . Stroke Maternal Grandmother    . Diabetes Paternal Grandmother    . Seizures Maternal Aunt        Social History         Tobacco Use  . Smoking status: Never Smoker  . Smokeless tobacco: Never Used  Substance Use Topics  . Alcohol use: No      Alcohol/week: 0.0 standard drinks  . Drug use: No      Allergies:       Allergies  Allergen Reactions  . Milk-Related Compounds Dermatitis      Breaks face out *can have 2% milk*  . Mushroom Extract Complex Anaphylaxis  . Penicillins Anaphylaxis      Has patient had a PCN reaction causing immediate rash, facial/tongue/throat  swelling, SOB or lightheadedness with hypotension: Yes Has patient had a PCN reaction causing severe rash involving mucus membranes or skin necrosis: No Has patient had a PCN reaction that required hospitalization No Has patient had a PCN reaction occurring within the last 10 years: Yes If all of the above answers are "NO", then may proceed with Cephalosporin use.    [name] Other (See Comments)      Breaks face out  . Latex Itching and Swelling  . Sulfa Antibiotics Swelling  . Vicodin [Hydrocodone-Acetaminophen] Hives             Medications Prior to Admission  Medication Sig Dispense Refill Last Dose  . acetaminophen (TYLENOL) 500 MG tablet Take 1,000 mg by mouth every 6 (six) hours as needed for headache.        . albuterol (PROVENTIL HFA;VENTOLIN HFA) 108 (90 Base) MCG/ACT inhaler Inhale 2 puffs into the lungs every 6 (six) hours as needed for wheezing or shortness of breath. 1 Inhaler 0    . aspirin EC 81 MG tablet Take 1 tablet (81 mg total) by mouth daily. Take after 12 weeks for prevention of preeclampsia later in pregnancy 300 tablet 2    . Blood Pressure Monitoring (BLOOD PRESSURE CUFF) MISC 1 Device by Does not apply route once a week. 1 each 0    . cyclobenzaprine (FLEXERIL) 10 MG tablet Take 1 tablet (10 mg total) by mouth 3 (three) times daily as needed for muscle spasms (back pain). 30 tablet 0    . docusate sodium (COLACE) 100 MG capsule Take 1 capsule (100 mg total) by mouth 2 (two) times daily. 60 capsule 2    . Doxylamine-Pyridoxine (DICLEGIS) 10-10 MG TBEC Take 2 tablets by mouth at bedtime. If symptoms persist, add one tablet in the morning and one in the afternoon 100 tablet 5    . Elastic Bandages & Supports (COMFORT FIT MATERNITY SUPP LG) MISC 1 application by Does not apply route daily. 1 each 0    .  folic acid (FOLVITE) 1 MG tablet Take 1 tablet (1 mg total) by mouth daily. 30 tablet 10    . levETIRAcetam (KEPPRA) 750 MG tablet Take 2 tablets (1,500 mg total) by  mouth 2 (two) times daily. 120 tablet 2    . metoCLOPramide (REGLAN) 5 MG tablet Take 1 tablet (5 mg total) by mouth 3 (three) times daily. 90 tablet 1    . ondansetron (ZOFRAN ODT) 4 MG disintegrating tablet Take 1 tablet (4 mg total) by mouth every 8 (eight) hours as needed for nausea or vomiting. 20 tablet 0    . pantoprazole (PROTONIX) 40 MG tablet Take 1 tablet (40 mg total) by mouth daily. 30 tablet 1    . polyethylene glycol (MIRALAX / GLYCOLAX) 17 g packet Take 17 g by mouth 2 (two) times daily. 24 each 3    . Prenat w/o A-FeCbn-Meth-FA-DHA (PRENATE MINI) 29-0.6-0.4-350 MG CAPS Take 1 capsule by mouth daily before breakfast. 30 capsule 11    . Promethazine HCl (PHENERGAN PO) Take by mouth.        . senna (SENOKOT) 8.6 MG TABS tablet Take 1 tablet (8.6 mg total) by mouth daily. 120 tablet 0        Review of Systems  Constitutional: Negative.   HENT: Negative.   Eyes: Negative.   Respiratory: Negative.   Cardiovascular: Negative.   Gastrointestinal: Negative.   Endocrine: Negative.   Genitourinary: Negative.   Musculoskeletal: Positive for back pain and gait problem (unable to feel or bear weight on RT leg).  Skin: Negative.   Neurological: Positive for seizures and headaches.  Hematological: Negative.   Psychiatric/Behavioral: Negative.     Physical Exam    Patient Vitals for the past 24 hrs:   BP Temp Temp src Pulse Resp SpO2  02/09/19 1511 -- 97.6 F (36.4 C) Oral -- -- --  02/09/19 1500 115/69 -- -- 94 -- --  02/09/19 1430 114/72 -- -- 97 19 --  02/09/19 1401 (!) 102/51 -- -- (!) 124 -- --  02/09/19 1246 126/67 -- -- (!) 102 20 97 %  02/09/19 1216 126/65 -- -- (!) 101 -- --  02/09/19 1209 131/76 -- -- (!) 102 -- --  02/09/19 1153 123/72 -- -- 96 -- --  02/09/19 1140 125/75 -- -- (!) 113 20 98 %    Physical Exam  Constitutional: She is oriented to person, place, and time. She appears well-developed.  HENT:  Head: Normocephalic.  Respiratory: Effort normal.  GI:  Soft.  Musculoskeletal:        General: Normal range of motion.     Cervical back: Normal range of motion.  Neurological: She is alert and oriented to person, place, and time.  Patient is unable to lift her right leg; has no reaction to pain, touch, pressure on right thigh or calf.   Skin: Skin is warm and dry.  Cervix is long, closed, thick   MAU Course  Procedures   -Patient immediately placed on fetal monitoring; labs drawn, will send for CBC, CMP, Protein Creatine to check for pre-e, although BPs normo-tensive here.  -will do head CT due to unable to rule out head injury -FOB updated on patients' status and plan. He is en route to bring her cell phone and charger and will wait by the bedside. 1543: Patient's CT is negative and she is requesting food; will check CBG and give snack. If she is feeling better, plan for discharge per Dr. Harolyn Rutherford.    Patient's  VSS, but she appears lethargic. Difficulty moving extremities, neurology paged at 1725 and again at 1750; neurology will round on patient.    NST continues to be reassuring; 130 bpm, mod var, present acel, neg decels, no contractions. ; VSS.    1815: patient is now more alert and oriented but reports that she cannot walk and has no feeling on her right leg, patient does not feel pinching and touch sensation on her right leg. Eating a normal diet.      1845: neurology at the bedside. Patient now stating that she missed her PM dose of Keppra on 1/30. Neurology (Dr. Lorraine Lax) recommends MRI.  2000: Waiting for MRI 2028: will give Keppra 1500 mg PO now (PO dose).    Mervyn Skeeters [place] 02/09/2019, 12:12 PM    Care assumed from Maye Hides, CNM @ 2000   2230: Patient returns from MRI , results pending  Patient is not willing to wait for results and for MAU provider to consult with neurologist about MRI results. She states to Verdell Carmine, RN that she does not want to wait and she is ready to go now, her back is hurting,  she can't get comfortable in the bed and she has been here for hours and is ready to go."  RN to get patient to sign AMA form.  After MAU provider speaking with patient @ 2245, she expressed being "tired of going to all kinds of doctors since she was 24 yrs old and no one knows what is going on with her. I've been here for 12 hours and you are just going to tell me the same thing; that you don't know what is wrong with me. Why is my back hurting? I have headaches everyday since I was 24 yrs old? And Why are my seizures picking up in frequency?"  I offered comfort measures to patient and verbalized understanding of her frustrations with medical providers not finding out what is wrong with her. Advised her that her RT leg paralysis is not an OB related complication and it is hard for [place] to tell her why it is happening.  Patient is willing to wait for MRI results and after MAU provider speaks to the neurologist.   2336: MRI results finalized. Page placed to Dr. Chauncey Cruel. [place].    2342: Return page from Dr. Lorraine Lax - notified of MRI results and no improvement in patient's RT leg mobility. Dr. Arrie Eastern recommendation is to admit patient for observation and he would have PT consult with her 02/10/2019.    2350: Dr. Lorraine Lax at bedside. Advised that Dr. Harolyn Rutherford wants to speak to him. Dr. Harolyn Rutherford notified that Dr. Lorraine Lax is at bedside.   0003: Dr. Harolyn Rutherford and Dr. Lorraine Lax discussing plan for observation admission.   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  I have reviewed the Advanced Practice Provider's note and chart, and I agree with the management and plan.  Discussed plan with Dr. Lorraine Lax face to face, will observe patient on OBSC. 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  MRI of lumbosacral spine and thoracic spine ordered; PT/OT also ordered.  Neurology will continue to follow along as consultants.  Continue her Keppra 1500 mg po bid for now. Appreciate Neurology input with the care of this patient.  Stable from on OB standpoint, normal BO. Category I FHR tracing. Cervix closed on exam, no signs/symptoms of labor. Given that she is 36 weeks; pelvic cultures were collected, will follow up results and manage accordingly.    Verita Schneiders, MD, [place] Attending [place], [facility] for [employer], [place]

## 2019-02-10 NOTE — Progress Notes (Signed)
Pt transport services currently at bedside to transport pt to her MRI.   Prior to being transported to MRI, pt was given her scheduled 2200 dose of KEPPRA (1,500 mg) early, at 2025, as a precaution of the MRI time interval overlapping the medicine's scheduled administration time.  Pt transported via bed in stable condition.

## 2019-02-11 ENCOUNTER — Encounter (HOSPITAL_COMMUNITY): Payer: Self-pay

## 2019-02-11 DIAGNOSIS — F449 Dissociative and conversion disorder, unspecified: Secondary | ICD-10-CM

## 2019-02-11 LAB — GC/CHLAMYDIA PROBE AMP (~~LOC~~) NOT AT ARMC
Chlamydia: NEGATIVE
Comment: NEGATIVE
Comment: NORMAL
Neisseria Gonorrhea: NEGATIVE

## 2019-02-11 NOTE — Progress Notes (Signed)
Inpatient Rehabilitation Admissions Coordinator  I have asked for therapy to see patient again today per pt's nurse request. I do not think [patient will qualify for an inpt rehab admit. Noted Neurology suggests non organic etiology.   Danne Baxter, RN, MSN Rehab Admissions Coordinator [phone] 02/11/2019 12:36 PM

## 2019-02-11 NOTE — Progress Notes (Signed)
NEUROLOGY PROGRESS NOTE   Subjective: States she still cannot move right leg and has decreased sensation in right leg.   Exam: Vitals:   02/10/19 2249 02/11/19 0530  BP: 120/70 110/76  Pulse: (!) 105 91  Resp: 20 19  Temp: 98.5 F (36.9 C) 98.5 F (36.9 C)  SpO2: 99% 99%    Physical Exam  Constitutional: Appears well-developed and well-nourished.  Eyes: No scleral injection HENT: No OP obstrucion Head: Normocephalic.  Cardiovascular: Normal rate and regular rhythm.  Respiratory: Effort normal, non-labored breathing GI: Soft.  No distension. There is no tenderness.  Skin: WDI   Neuro:  Mental Status: Alert, oriented, thought content appropriate.  Speech fluent without evidence of aphasia.  Able to follow 3 step commands without difficulty. Cranial Nerves: II:  Visual fields grossly normal,  III,IV, VI: ptosis not present, extra-ocular motions intact bilaterally pupils equal, round, reactive to light and accommodation V,VII: smile symmetric, facial light touch sensation normal bilaterally VIII: hearing normal bilaterally Motor: Normal tone throughout; no atrophy noted Right : Upper extremity   5/5    Left:     Upper extremity   5/5  Lower extremity   See below    Lower extremity   5/5 -It should be noted that patient had positive Hoover's sign -It should be noted that when the patient pulled herself up to help me with exam she demonstrated 4/5 strength to push her RLE down and move herself up in the bed -Adductor's and abductors 5/5 to BLE Sensory: Pinprick and light touch intact throughout, bilaterally Deep Tendon Reflexes: 2+ and symmetric in UE with 2+ left KJ and AJ, right KJ 1+ and AJ 2+ however she was not relaxing her right leg fully Plantars: Right: downgoing   Left: downgoing     Medications:  Scheduled: . aspirin EC  81 mg Oral Daily  . docusate sodium  100 mg Oral BID  . folic acid  1 mg Oral Daily  . levETIRAcetam  1,500 mg Oral BID  . pantoprazole   40 mg Oral Daily  . prenatal multivitamin  1 tablet Oral Q1200    Pertinent Labs/Diagnostics:  CT Head Wo Contrast  Result Date: 02/09/2019 CLINICAL DATA:  Multiple seizures.  Altered mental status. EXAM: CT HEAD WITHOUT CONTRAST TECHNIQUE: Contiguous axial images were obtained from the base of the skull through the vertex without intravenous contrast. COMPARISON:  December 16, 2015 FINDINGS: Brain: No evidence of acute infarction, hemorrhage, hydrocephalus, extra-axial collection or mass lesion/mass effect. Vascular: No hyperdense vessel or unexpected calcification. Skull: Normal. Negative for fracture or focal lesion. Sinuses/Orbits: No acute finding. Other: None. IMPRESSION: Normal brain. Electronically Signed   By: Dorise Bullion III M.D   On: 02/09/2019 13:29   MR BRAIN WO CONTRAST  Result Date: 02/09/2019 CLINICAL DATA:  At hernia sacrum intact event face fat a without a CT socks are who septation attic EXAM: MRI HEAD WITHOUT CONTRAST MRV HEAD WITHOUT CONTRAST TECHNIQUE: Multiplanar, multiecho pulse sequences of the brain and surrounding structures were obtained without intravenous contrast. Angiographic images of the intracranial venous structures were obtained using MRV technique without intravenous contrast. COMPARISON:  None. FINDINGS: MRI HEAD FINDINGS Brain: Examination moderately degraded by motion artifact. Cerebral volume within normal limits for age. No definite focal parenchymal signal abnormality identified. No abnormal foci of restricted diffusion to suggest acute or subacute ischemia or changes related to seizure. Gray-white matter differentiation well maintained. No encephalomalacia to suggest chronic cortical infarction. No foci of susceptibility artifact to  suggest acute or chronic intracranial hemorrhage. No mass lesion, midline shift or mass effect. No hydrocephalus. No extra-axial fluid collection. No intrinsic temporal lobe abnormality seen on this motion degraded exam.  Pituitary gland is diffusely prominent, compatible with current pregnancy status. Midline structures intact. Vascular: Major intracranial vascular flow voids are maintained. Skull and upper cervical spine: Craniocervical junction within normal limits. Visualized upper cervical spine unremarkable. Diffusely decreased T1 signal intensity seen throughout the visualized bone marrow, nonspecific, but suspected to be related to current pregnancy status and/or anemia. No scalp soft tissue abnormality. Sinuses/Orbits: Globes and orbital soft tissues within normal limits. Paranasal sinuses are largely clear. Trace fluid signal intensity noted within left mastoid air cells, of doubtful significance. Inner ear structures grossly normal. Other: None. MRV HEAD FINDINGS Examination moderately degraded by motion artifact Normal flow related signal seen throughout the superior sagittal sinus to the torcula. Right transverse and sigmoid sinuses are patent as is the visualized proximal right internal jugular vein. Left transverse sinus hypoplastic but grossly patent as well. Patent left sigmoid sinus. Visualized proximal left internal jugular vein patent. Straight sinus, vein of Galen, and basal veins of Rosenthal patent. No evidence for dural sinus thrombosis. No appreciable cortical vein thrombosis seen on corresponding SWI sequence. IMPRESSION: 1. Motion degraded exam. 2. Normal brain MRI.  No acute intracranial abnormality identified. 3. Normal intracranial MRV.  No evidence for dural sinus thrombosis. Electronically Signed   By: Jeannine Boga M.D.   On: 02/09/2019 23:25   MR CERVICAL SPINE WO CONTRAST  Result Date: 02/10/2019 CLINICAL DATA:  Periodically right lower extremity paralysis EXAM: MRI CERVICAL, THORACIC AND LUMBAR SPINE WITHOUT CONTRAST TECHNIQUE: Multiplanar and multiecho pulse sequences of the cervical spine, to include the craniocervical junction and cervicothoracic junction, and thoracic and lumbar  spine, were obtained without intravenous contrast. COMPARISON:  None. FINDINGS: MRI CERVICAL SPINE FINDINGS Alignment: Physiologic. Vertebrae: No fracture, evidence of discitis, or bone lesion. Cord: Normal signal and morphology. Posterior Fossa, vertebral arteries, paraspinal tissues: Negative. Disc levels: C1-C2: Unremarkable. C2-C3: Normal disc space and facet joints. There is no spinal canal stenosis. No neural foraminal stenosis. C3-C4: Normal disc space and facet joints. There is no spinal canal stenosis. No neural foraminal stenosis. C4-C5: Normal disc space and facet joints. There is no spinal canal stenosis. No neural foraminal stenosis. C5-C6: Normal disc space and facet joints. There is no spinal canal stenosis. No neural foraminal stenosis. C6-C7: Normal disc space and facet joints. There is no spinal canal stenosis. No neural foraminal stenosis. C7-T1: Normal disc space and facet joints. There is no spinal canal stenosis. No neural foraminal stenosis. MRI THORACIC SPINE FINDINGS Alignment:  Physiologic. Vertebrae: No fracture, evidence of discitis, or bone lesion. Cord:  Normal signal and morphology. Paraspinal and other soft tissues: Negative. Disc levels: No disc herniation or stenosis. MRI LUMBAR SPINE FINDINGS Segmentation:  Standard. Alignment:  Physiologic. Vertebrae:  No fracture, evidence of discitis, or bone lesion. Conus medullaris and cauda equina: Conus extends to the L1 level. Conus and cauda equina appear normal. Paraspinal and other soft tissues: Negative. Disc levels: T12-L1: Normal disc space and facet joints. There is no spinal canal stenosis. No neural foraminal stenosis. L1-L2: Normal disc space and facet joints. There is no spinal canal stenosis. No neural foraminal stenosis. L2-L3: Normal disc space and facet joints. There is no spinal canal stenosis. No neural foraminal stenosis. L3-L4: Normal disc space and facet joints. There is no spinal canal stenosis. No neural foraminal  stenosis. L4-L5: Normal  disc space and facet joints. There is no spinal canal stenosis. No neural foraminal stenosis. L5-S1: Mild disc desiccation. There is no spinal canal stenosis. No neural foraminal stenosis. Visualized sacrum: Normal. IMPRESSION: Normal MRI of the cervical, thoracic and lumbar spine. Electronically Signed   By: Ulyses Jarred M.D.   On: 02/10/2019 22:13   MR THORACIC SPINE WO CONTRAST  Result Date: 02/10/2019 CLINICAL DATA:  Periodically right lower extremity paralysis EXAM: MRI CERVICAL, THORACIC AND LUMBAR SPINE WITHOUT CONTRAST TECHNIQUE: Multiplanar and multiecho pulse sequences of the cervical spine, to include the craniocervical junction and cervicothoracic junction, and thoracic and lumbar spine, were obtained without intravenous contrast. COMPARISON:  None. FINDINGS: MRI CERVICAL SPINE FINDINGS Alignment: Physiologic. Vertebrae: No fracture, evidence of discitis, or bone lesion. Cord: Normal signal and morphology. Posterior Fossa, vertebral arteries, paraspinal tissues: Negative. Disc levels: C1-C2: Unremarkable. C2-C3: Normal disc space and facet joints. There is no spinal canal stenosis. No neural foraminal stenosis. C3-C4: Normal disc space and facet joints. There is no spinal canal stenosis. No neural foraminal stenosis. C4-C5: Normal disc space and facet joints. There is no spinal canal stenosis. No neural foraminal stenosis. C5-C6: Normal disc space and facet joints. There is no spinal canal stenosis. No neural foraminal stenosis. C6-C7: Normal disc space and facet joints. There is no spinal canal stenosis. No neural foraminal stenosis. C7-T1: Normal disc space and facet joints. There is no spinal canal stenosis. No neural foraminal stenosis. MRI THORACIC SPINE FINDINGS Alignment:  Physiologic. Vertebrae: No fracture, evidence of discitis, or bone lesion. Cord:  Normal signal and morphology. Paraspinal and other soft tissues: Negative. Disc levels: No disc herniation or  stenosis. MRI LUMBAR SPINE FINDINGS Segmentation:  Standard. Alignment:  Physiologic. Vertebrae:  No fracture, evidence of discitis, or bone lesion. Conus medullaris and cauda equina: Conus extends to the L1 level. Conus and cauda equina appear normal. Paraspinal and other soft tissues: Negative. Disc levels: T12-L1: Normal disc space and facet joints. There is no spinal canal stenosis. No neural foraminal stenosis. L1-L2: Normal disc space and facet joints. There is no spinal canal stenosis. No neural foraminal stenosis. L2-L3: Normal disc space and facet joints. There is no spinal canal stenosis. No neural foraminal stenosis. L3-L4: Normal disc space and facet joints. There is no spinal canal stenosis. No neural foraminal stenosis. L4-L5: Normal disc space and facet joints. There is no spinal canal stenosis. No neural foraminal stenosis. L5-S1: Mild disc desiccation. There is no spinal canal stenosis. No neural foraminal stenosis. Visualized sacrum: Normal. IMPRESSION: Normal MRI of the cervical, thoracic and lumbar spine. Electronically Signed   By: Ulyses Jarred M.D.   On: 02/10/2019 22:13   MR LUMBAR SPINE WO CONTRAST  Result Date: 02/10/2019 CLINICAL DATA:  Periodically right lower extremity paralysis EXAM: MRI CERVICAL, THORACIC AND LUMBAR SPINE WITHOUT CONTRAST TECHNIQUE: Multiplanar and multiecho pulse sequences of the cervical spine, to include the craniocervical junction and cervicothoracic junction, and thoracic and lumbar spine, were obtained without intravenous contrast. COMPARISON:  None. FINDINGS: MRI CERVICAL SPINE FINDINGS Alignment: Physiologic. Vertebrae: No fracture, evidence of discitis, or bone lesion. Cord: Normal signal and morphology. Posterior Fossa, vertebral arteries, paraspinal tissues: Negative. Disc levels: C1-C2: Unremarkable. C2-C3: Normal disc space and facet joints. There is no spinal canal stenosis. No neural foraminal stenosis. C3-C4: Normal disc space and facet joints. There  is no spinal canal stenosis. No neural foraminal stenosis. C4-C5: Normal disc space and facet joints. There is no spinal canal stenosis. No neural foraminal stenosis. C5-C6:  Normal disc space and facet joints. There is no spinal canal stenosis. No neural foraminal stenosis. C6-C7: Normal disc space and facet joints. There is no spinal canal stenosis. No neural foraminal stenosis. C7-T1: Normal disc space and facet joints. There is no spinal canal stenosis. No neural foraminal stenosis. MRI THORACIC SPINE FINDINGS Alignment:  Physiologic. Vertebrae: No fracture, evidence of discitis, or bone lesion. Cord:  Normal signal and morphology. Paraspinal and other soft tissues: Negative. Disc levels: No disc herniation or stenosis. MRI LUMBAR SPINE FINDINGS Segmentation:  Standard. Alignment:  Physiologic. Vertebrae:  No fracture, evidence of discitis, or bone lesion. Conus medullaris and cauda equina: Conus extends to the L1 level. Conus and cauda equina appear normal. Paraspinal and other soft tissues: Negative. Disc levels: T12-L1: Normal disc space and facet joints. There is no spinal canal stenosis. No neural foraminal stenosis. L1-L2: Normal disc space and facet joints. There is no spinal canal stenosis. No neural foraminal stenosis. L2-L3: Normal disc space and facet joints. There is no spinal canal stenosis. No neural foraminal stenosis. L3-L4: Normal disc space and facet joints. There is no spinal canal stenosis. No neural foraminal stenosis. L4-L5: Normal disc space and facet joints. There is no spinal canal stenosis. No neural foraminal stenosis. L5-S1: Mild disc desiccation. There is no spinal canal stenosis. No neural foraminal stenosis. Visualized sacrum: Normal. IMPRESSION: Normal MRI of the cervical, thoracic and lumbar spine. Electronically Signed   By: Ulyses Jarred M.D.   On: 02/10/2019 22:13   MR Venogram Head  Result Date: 02/09/2019 CLINICAL DATA:  At hernia sacrum intact event face fat a without a  CT socks are who septation attic EXAM: MRI HEAD WITHOUT CONTRAST MRV HEAD WITHOUT CONTRAST TECHNIQUE: Multiplanar, multiecho pulse sequences of the brain and surrounding structures were obtained without intravenous contrast. Angiographic images of the intracranial venous structures were obtained using MRV technique without intravenous contrast. COMPARISON:  None. FINDINGS: MRI HEAD FINDINGS Brain: Examination moderately degraded by motion artifact. Cerebral volume within normal limits for age. No definite focal parenchymal signal abnormality identified. No abnormal foci of restricted diffusion to suggest acute or subacute ischemia or changes related to seizure. Gray-white matter differentiation well maintained. No encephalomalacia to suggest chronic cortical infarction. No foci of susceptibility artifact to suggest acute or chronic intracranial hemorrhage. No mass lesion, midline shift or mass effect. No hydrocephalus. No extra-axial fluid collection. No intrinsic temporal lobe abnormality seen on this motion degraded exam. Pituitary gland is diffusely prominent, compatible with current pregnancy status. Midline structures intact. Vascular: Major intracranial vascular flow voids are maintained. Skull and upper cervical spine: Craniocervical junction within normal limits. Visualized upper cervical spine unremarkable. Diffusely decreased T1 signal intensity seen throughout the visualized bone marrow, nonspecific, but suspected to be related to current pregnancy status and/or anemia. No scalp soft tissue abnormality. Sinuses/Orbits: Globes and orbital soft tissues within normal limits. Paranasal sinuses are largely clear. Trace fluid signal intensity noted within left mastoid air cells, of doubtful significance. Inner ear structures grossly normal. Other: None. MRV HEAD FINDINGS Examination moderately degraded by motion artifact Normal flow related signal seen throughout the superior sagittal sinus to the torcula.  Right transverse and sigmoid sinuses are patent as is the visualized proximal right internal jugular vein. Left transverse sinus hypoplastic but grossly patent as well. Patent left sigmoid sinus. Visualized proximal left internal jugular vein patent. Straight sinus, vein of Galen, and basal veins of Rosenthal patent. No evidence for dural sinus thrombosis. No appreciable cortical vein thrombosis seen on  corresponding SWI sequence. IMPRESSION: 1. Motion degraded exam. 2. Normal brain MRI.  No acute intracranial abnormality identified. 3. Normal intracranial MRV.  No evidence for dural sinus thrombosis. Electronically Signed   By: Jeannine Boga M.D.   On: 02/09/2019 23:25     Berkli Garnto PA-C [facility] [phone]  Assessment:  24 year old female, [redacted] weeks pregnant, with history of epilepsy on Keppra.  Patient had 2 witnessed seizures likely secondary to missed doses of medication.   1. Patient currently is still not back to baseline regarding RLE. Multiple functional exam findings on evaluation of RLE strength are suggestive of nonorganic etiology. 2. Cervical, thoracic and lumbar spine MRI negative  Recommendations: - Continue Keppra 1.5 g twice daily.  No need to increase dose as her seizure occurred in the context of missed medication - Seizure precautions - PT for assistance and encouragement with ambulation -- No further neurology recommendations at this time. Neurology service will sign off. Please call us if there are additional questions or if the patient continues to be unable to ambulate despite PT   Electronically signed: Dr. Kerney Elbe 02/11/2019, 8:34 AM

## 2019-02-11 NOTE — Progress Notes (Signed)
Patient ID: Latoya Stark, female   DOB: 1995/09/14, 24 y.o.   MRN: [id number] [place]) NOTE  Latoya Stark is a 24 y.o. G4P0030 at 36.4 weeks by best clinical estimate who is admitted for seizure, leg weakness.   Fetal presentation is unsure. Length of Stay:  1  Days  Subjective: Leg weakness on right Patient reports the fetal movement as active. Patient reports uterine contraction  activity as none. Patient reports  vaginal bleeding as none. Patient describes fluid per vagina as None.  Vitals:  Blood pressure 110/76, pulse 91, temperature 98.5 F (36.9 C), temperature source Oral, resp. rate 19, height 5\' 4"  (1.626 m), weight (!) 148.7 kg, SpO2 99 %, unknown if currently breastfeeding. Physical Examination:  General appearance - alert, well appearing, and in no distress Heart - normal rate and regular rhythm Abdomen - soft, nontender, nondistended Fundal Height:  size equals dates Cervical Exam: Not evaluated.  Extremities: extremities normal, atraumatic, no cyanosis or edema and Homans sign is negative, no sign of DVT with DTRs 2+ bilaterally. Unable to lift or move right leg Membranes:intact  Fetal Monitoring:  Baseline: 135 bpm, Variability: Good {> 6 bpm), Accelerations: Reactive and Decelerations: Absent  Labs:  No results found for this or any previous visit (from the past 24 hour(s)).   Medications:  Scheduled . aspirin EC  81 mg Oral Daily  . docusate sodium  100 mg Oral BID  . folic acid  1 mg Oral Daily  . levETIRAcetam  1,500 mg Oral BID  . pantoprazole  40 mg Oral Daily  . prenatal multivitamin  1 tablet Oral Q1200   I have reviewed the patient's current medications.  ASSESSMENT: Patient Active Problem List   Diagnosis Date Noted  . Conversion disorder 02/10/2019  . 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 Seizure disorder in pregnancy ([place]) 11/23/2018  . UTI (urinary tract infection) in pregnancy in first trimester  08/04/2018  . 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 Chronic migraine 08/01/2018  . Supervision of high risk pregnancy, antepartum 07/30/2018  . Maternal obesity affecting pregnancy, antepartum 07/30/2018  . History of seizure 07/30/2018    PLAN: Keppra 1500 BID F/u by PT and neuro appreciated  Emeterio Reeve 02/11/2019,7:28 AM

## 2019-02-11 NOTE — Progress Notes (Signed)
Physical Therapy Treatment Patient Details Name: Latoya Stark MRN: [id number] DOB: 04-30-1995 Today's Date: 02/11/2019    History of Present Illness Pt is 24 yo female who is [redacted] week gestation with pmh including chronic migraine, depression, seizures, and anxiety (see full PMH in H & P).  Pt admitted after have 2 witness seizures with residual R leg paralysis.  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  MRI Brain : normal.  MRI Spine: normal.  Per neuro likely non organic etiology.    PT Comments    Pt making good progress today.  She was able to ambulate 150' with upright EVA walker and 6' with RW.  Pt had no episodes of R LE buckling and able to weightbear on it with transfers and gait.  Continues to not be able to actively move leg in/out of bed or perform LAQ but does demonstrate muscle activation with transfers.  Reports sensation now only diminished below knee.  Pt has family support at home and reports she could stay at her mom's where there is a ramp to enter and a w/c she could borrow.  Pt demonstrates ability to transfer and ambulate for transfers to return home with family with use of w/c from PT perspective.  Would benefit from HHPT to continue to advance mobility.      Follow Up Recommendations  Home health PT     Equipment Recommendations  Rolling walker with 5" wheels;3in1 (PT)    Recommendations for Other Services       Precautions / Restrictions Precautions Precautions: Fall    Mobility  Bed Mobility Overal bed mobility: Needs Assistance Bed Mobility: Supine to Sit     Supine to sit: Min assist     General bed mobility comments: Assist for R LE in and out of bed  Transfers Overall transfer level: Needs assistance   Transfers: Sit to/from Stand Sit to Stand: Min guard         General transfer comment: Stand x 1 w Charlaine Dalton, x2 with EVA Walker, x 1 with RW; cues for safe hand placement and for safe sitting  technique  Ambulation/Gait Ambulation/Gait assistance: Min assist Gait Distance (Feet): 150 Feet Assistive device: Bilateral platform walker;Rolling walker (2 wheeled) Gait Pattern/deviations: Step-to pattern;Decreased stride length Gait velocity: decreased   General Gait Details: 150' with EVA Walker with min A to stabilize walker; 6' with RW; chair follow with both.  Pt cued for sequencing and posture.  Tended to slide R LE forward from hip, no buckling in knee, no LOB.   Stairs             Wheelchair Mobility    Modified Rankin (Stroke Patients Only)       Balance Overall balance assessment: Needs assistance   Sitting balance-Leahy Scale: Normal     Standing balance support: Single extremity supported;During functional activity Standing balance-Leahy Scale: Fair Standing balance comment: Performed standing and weight shifting in [place] prior to gait.  Pt was able to lift L LE and weight bear on R LE.                            Cognition Arousal/Alertness: Awake/alert Behavior During Therapy: WFL for tasks assessed/performed Overall Cognitive Status: Within Functional Limits for tasks assessed  Exercises      General Comments General comments (skin integrity, edema, etc.): Pt has w/c at home that she can borrow and a ramp to get into her mother's house where she can stay for a while.  Discussed would need RW for transfers, BSC, and HHPT, and supervision. Pt reports performing AAROM exercises with significant other assisting.      Pertinent Vitals/Pain Pain Assessment: No/denies pain    Home Living                      Prior Function            PT Goals (current goals can now be found in the care plan section) Progress towards PT goals: Progressing toward goals    Frequency    Min 4X/week      PT Plan Discharge plan needs to be updated;Frequency needs to be  updated;Equipment recommendations need to be updated    Co-evaluation              AM-PAC PT "6 Clicks" Mobility   Outcome Measure  Help needed turning from your back to your side while in a flat bed without using bedrails?: None Help needed moving from lying on your back to sitting on the side of a flat bed without using bedrails?: A Little Help needed moving to and from a bed to a chair (including a wheelchair)?: A Little Help needed standing up from a chair using your arms (e.g., wheelchair or bedside chair)?: A Little Help needed to walk in hospital room?: A Little Help needed climbing 3-5 steps with a railing? : A Lot 6 Click Score: 18    End of Session Equipment Utilized During Treatment: Gait belt Activity Tolerance: Patient tolerated treatment well Patient left: in chair;with call bell/phone within reach;with family/visitor present Nurse Communication: Mobility status(equipment needs at d/c and HHPT) PT Visit Diagnosis: Muscle weakness (generalized) (M62.81);Other abnormalities of gait and mobility (R26.89)     Time: 1522-1600 PT Time Calculation (min) (ACUTE ONLY): 38 min  Charges:  $Gait Training: 23-37 mins $Therapeutic Activity: 8-22 mins                     Maggie Font, PT Acute Rehab Services Pager [phone] [facility] [phone] [facility] [phone]    Karlton Lemon 02/11/2019, 4:22 PM

## 2019-02-12 DIAGNOSIS — O99210 Obesity complicating pregnancy, unspecified trimester: Secondary | ICD-10-CM

## 2019-02-12 LAB — CULTURE, BETA STREP (GROUP B ONLY)

## 2019-02-12 LAB — OB RESULTS CONSOLE GBS: GBS: NEGATIVE

## 2019-02-12 LAB — LEVETIRACETAM LEVEL: Levetiracetam Lvl: <1 ug/mL — ABNORMAL LOW (ref 10.0–40.0)

## 2019-02-12 MED ORDER — DOCUSATE SODIUM 100 MG PO CAPS: 100.0000 mg | ORAL_CAPSULE | Freq: Two times a day (BID) | ORAL | 2 refills | Status: AC

## 2019-02-12 NOTE — Discharge Summary (Signed)
OB Discharge Summary     Patient Name: Latoya Stark DOB: [birth year] MRN: [id number]  Date of admission: 02/09/2019 Delivering MD: This patient has no babies on file.  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 Patient with known seizure disorder on Keppra. Patient admits to missing 2 doses due to distractions related to her baby shower. Patient reported right lower extremity paralysis following her postictal state. Head CT/MRI and MRI of spine were all normal. Patient was cleared by neurology. Patient was evaluated by PT who deemed her safe for discharge home with a walker. Patient plans to stay with her mother due to having less stairs. She agrees to follow up with outpatient PT. Patient to follow up next week for routine prenatal care. Discharge instructions reviewed with the patient  Physical exam  Vitals:   02/11/19 1521 02/11/19 1943 02/12/19 0041 02/12/19 0812  BP: (!) 110/50 (!) 95/44 114/84 119/69  Pulse: (!) 103 (!) 106 98 (!) 112  Resp: 18 19 19 19   Temp: 98.1 F (36.7 C) 98.2 F (36.8 C) 97.8 F (36.6 C) 98.3 F (36.8 C)  TempSrc: Oral Oral Oral Oral  SpO2: 99% 100% 100% 98%  Weight:      Height:       GENERAL: Well-developed, well-nourished female in no acute distress.  LUNGS: Clear to auscultation bilaterally.  HEART: Regular rate and rhythm. ABDOMEN: Soft, nontender, gravid PELVIC: Not indicated EXTREMITIES: No cyanosis, clubbing, or edema, 2+ distal pulses.  Labs: Lab  Results  Component Value Date   WBC 6.2 02/10/2019   HGB 10.3 (L) 02/10/2019   HCT 32.6 (L) 02/10/2019   MCV 89.3 02/10/2019   PLT 263 02/10/2019   CMP Latest Ref Rng & Units 02/10/2019  Glucose 70 - 99 mg/dL 125(H)  BUN 6 - 20 mg/dL <5(L)  Creatinine 0.44 - 1.00 mg/dL 0.46  Sodium 135 - 145 mmol/L 137  Potassium 3.5 - 5.1 mmol/L 3.8  Chloride 98 - 111 mmol/L 104  CO2 22 - 32 mmol/L 22  Calcium 8.9 - 10.3 mg/dL 9.0  Total Protein 6.5 - 8.1 g/dL 5.5(L)  Total Bilirubin 0.3 - 1.2 mg/dL 0.4  Alkaline Phos 38 - 126 U/L 139(H)  AST 15 - 41 U/L 20  ALT 0 - 44 U/L 19    Discharge instruction: per After Visit Summary and "Baby and Me Booklet".  After visit meds:  Allergies as of 02/12/2019      Reactions   Milk-related Compounds Dermatitis   Breaks face out *can have 2% milk*   Mushroom Extract Complex Anaphylaxis   Penicillins Anaphylaxis   Has patient had a PCN reaction causing immediate rash, facial/tongue/throat swelling, SOB or lightheadedness with hypotension: Yes Has patient had a PCN reaction causing severe rash involving mucus membranes or skin necrosis: No Has patient had a PCN reaction that required hospitalization No Has patient  had a PCN reaction occurring within the last 10 years: Yes If all of the above answers are "NO", then may proceed with Cephalosporin use.   Cheese Other (See Comments)   Breaks face out   Latex Itching, Swelling   Sulfa Antibiotics Swelling   Vicodin [hydrocodone-acetaminophen] Hives      Medication List    TAKE these medications   acetaminophen 500 MG tablet Commonly known as: TYLENOL Take 1,000 mg by mouth every 6 (six) hours as needed for headache.   albuterol 108 (90 Base) MCG/ACT inhaler Commonly known as: VENTOLIN HFA Inhale 2 puffs into the lungs every 6 (six) hours as needed for wheezing or shortness of breath.   aspirin EC 81 MG tablet Take 1 tablet (81 mg total) by mouth daily. Take after 12 weeks for prevention of  preeclampsia later in pregnancy   Blood Pressure Cuff Misc 1 Device by Does not apply route once a week.   Comfort Fit Maternity Supp Lg Misc 1 application by Does not apply route daily.   cyclobenzaprine 10 MG tablet Commonly known as: FLEXERIL Take 1 tablet (10 mg total) by mouth 3 (three) times daily as needed for muscle spasms (back pain).   docusate sodium 100 MG capsule Commonly known as: Colace Take 1 capsule (100 mg total) by mouth 2 (two) times daily.   Doxylamine-Pyridoxine 10-10 MG Tbec Commonly known as: Diclegis Take 2 tablets by mouth at bedtime. If symptoms persist, add one tablet in the morning and one in the afternoon   folic acid 1 MG tablet Commonly known as: FOLVITE Take 1 tablet (1 mg total) by mouth daily.   levETIRAcetam 750 MG tablet Commonly known as: KEPPRA Take 2 tablets (1,500 mg total) by mouth 2 (two) times daily.   metoCLOPramide 5 MG tablet Commonly known as: Reglan Take 1 tablet (5 mg total) by mouth 3 (three) times daily.   ondansetron 4 MG disintegrating tablet Commonly known as: Zofran ODT Take 1 tablet (4 mg total) by mouth every 8 (eight) hours as needed for nausea or vomiting.   pantoprazole 40 MG tablet Commonly known as: Protonix Take 1 tablet (40 mg total) by mouth daily.   PHENERGAN PO Take 25 mg by mouth every 6 (six) hours as needed (nausea).   polyethylene glycol 17 g packet Commonly known as: MIRALAX / GLYCOLAX Take 17 g by mouth 2 (two) times daily.   Prenate Mini 29-0.6-0.4-350 MG Caps Take 1 capsule by mouth daily before breakfast.   senna 8.6 MG Tabs tablet Commonly known as: SENOKOT Take 1 tablet (8.6 mg total) by mouth daily.            Durable Medical Equipment  (From admission, onward)         Start     Ordered   02/12/19 1036  For home use only DME 3 n 1  Once    Comments: HD- patient is 325 lbs Please deliver to patient's room   02/12/19 1036   02/12/19 1035  For home use only DME Walker   Once    Comments: HD- patient is 325 lbs.  5 inch wheels.  Question:  Patient needs a walker to treat with the following condition  Answer:  Right leg weakness   02/12/19 1036          Diet: routine diet  Activity: Advance as tolerated.   Outpatient follow up:1 weeks Follow up Appt: Future Appointments  Date Time Provider [place]  02/18/2019  8:45  AM Woodroe Mode, MD [facility] None  02/19/2019  9:30 AM Debbora Presto, NP [facility] None     02/12/2019 Mora Bellman, MD

## 2019-02-12 NOTE — Care Management Note (Signed)
Case Management Note  Patient Details  Name: Latoya Stark MRN: [id number] Date of Birth: [birth year]  Subjective/Objective:                  Seizure disorder in pregnancy with right lower extremity weakness [redacted] weeks pregnant.  Action/Plan: Dc home with outpatient physical therapy; equipment 3N1, rolling walker, and set up PCP appoinment  Expected Discharge Date: 02/12/19                 Expected Discharge Plan:  Home/Self Care  In-House Referral:  PCP / [facility]  Discharge planning Services  CM Consult    DME Arranged:  3-N-1, Walker rolling DME Agency:  [facility]  Status of Service:  Completed, signed off   Additional Comments: CM met with patient in room and discussed discharge plans.  Patient expressed that she was having a hard time getting a PCP.  CM assisted patient and set up and PCP appointment for patient 03/24/19 at 10:30 at the [facility] [phone] - CM made appointment with Peter Congo at their office.  Patient had need for 3n1 and also rolling walker with 5 inch wheels.  CM called Andree Coss with [facility] ph# [phone] and referral gave to him and confirmation given. Equipment will be delivered to patient's room prior to discharge.  Recommendations are for patient to have physical therapy when discharged.  CM unable to set up in the home with Home Health agency due to pregnancy.  CM spoke to MD and arrangements/referral  made for patient to have physical therapy at the [facility]  Ph# [phone].  Information explained to patient and she verbalized understanding and in agreement with plan.  She states she has her brother with her at night and the father of the baby with her during the day and they or her mom will provide transportation to the physical therapy appointments.  [facility] will call patient with an appointment. Patient denies any other needs.   Rosita Fire  RNC-MNN, BSN Transitions of Care Pediatrics/Women's and [place]  02/12/2019, 11:38 AM

## 2019-02-12 NOTE — Discharge Instructions (Signed)
Pregnancy and Epilepsy  Epilepsy is a condition in which a person has repeated seizures over time. Epilepsy can cause problems during pregnancy, but with the right care the chances of having a normal, healthy baby are good. Most women with epilepsy have normal pregnancies and healthy babies. What kind of problems can epilepsy cause during pregnancy? The problems that may happen due to epilepsy depend on whether or not you take medicines for your condition during your pregnancy. Without treatment, epilepsy can put you and your baby at risk for certain problems, including:  More frequent seizures than usual.  Placenta problems.  Premature labor and birth.  Slowed heart rate and low oxygen supply to the baby.  Slower than normal growth in the uterus (intrauterine growth restriction). How is epilepsy treated during pregnancy? During pregnancy, treatment for epilepsy may include:  Taking a medicine that helps prevent seizures (antiepileptic drug). This medicine is commonly prescribed for women who have had seizures within several years of the pregnancy.  Taking a higher than normal doses of folic acid. Folic acid helps prevent your baby from developing spinal cord defects. Working with your health care provider and going to all your prenatal appointments is an important part of treatment. Will my medicine affect my baby? Antiepileptic medicines can affect you and your baby by increasing the risk for:  Vaginal bleeding early in pregnancy.  High blood pressure during pregnancy (preeclampsia).  Birth defects.  Labor induction. This is when you fail to go into labor, and labor has to be started for you.  A cesarean delivery.  Complications after birth. Most of the time, medicines are prescribed anyway because the risk of the medicines harming your baby is lower than the risk of a seizure harming your baby. If your health care provider prescribes this medicine, he or she will prescribe the  safest kind of medicine at the lowest dose that will still prevent seizures. You will also have blood tests often to make sure that the medicine is at a safe level. What if I have a seizure while I am pregnant? Women with epilepsy are more likely to have seizures during pregnancy due to hormonal changes, stress, or a lower dose of seizure medicine. It is important to carefully follow your medicine and seizure-control plans during pregnancy. If you have a seizure during your pregnancy, your risk for early delivery may increase. Will I be able to breastfeed my baby? Women with epilepsy are encouraged to breastfeed. The antiepileptic drug may pass through your breast milk in small amounts, but the amount is usually not enough to affect your baby. Contact a health care provider if:  You think you are having seizures.  You do not feel the baby moving as much as usual.  You develop tiredness or weakness.  You feel like you are going to faint. Get help right away if:  You have very bad abdominal pain.  You have vaginal bleeding.  You do not feel the baby moving.  You have very bad headaches.  You have vision problems.  A part of your body feels numb.  You cannot stop vomiting. Summary  Epilepsy is a condition in which a person has repeated seizures over time.  Most women with epilepsy have normal pregnancies and healthy babies.  Follow your healthcare provider's recommendations regarding management and treatment of epilepsy during pregnancy. This information is not intended to replace advice given to you by your health care provider. Make sure you discuss any questions you have with  your health care provider. Document Revised: 04/19/2018 Document Reviewed: 03/08/2016 Elsevier Patient Education  2020 [employer].

## 2019-02-12 NOTE — Progress Notes (Signed)
Patient was given discharge instructions and paperwork. Medications and follow up appointments were reviewed.   Patient received bed side commode and walker for home use.   Tresa Garter, [place] 02/12/2019 4:28 PM

## 2019-02-13 ENCOUNTER — Ambulatory Visit: Payer: Medicaid Other | Admitting: Physical Therapy

## 2019-02-14 ENCOUNTER — Ambulatory Visit: Payer: Medicaid Other | Attending: Obstetrics and Gynecology | Admitting: Physical Therapy

## 2019-02-14 ENCOUNTER — Other Ambulatory Visit: Payer: Self-pay

## 2019-02-14 ENCOUNTER — Encounter: Payer: Self-pay | Admitting: Physical Therapy

## 2019-02-14 DIAGNOSIS — R262 Difficulty in walking, not elsewhere classified: Secondary | ICD-10-CM | POA: Diagnosis present

## 2019-02-14 DIAGNOSIS — M6281 Muscle weakness (generalized): Secondary | ICD-10-CM | POA: Diagnosis present

## 2019-02-14 DIAGNOSIS — R2689 Other abnormalities of gait and mobility: Secondary | ICD-10-CM | POA: Insufficient documentation

## 2019-02-14 DIAGNOSIS — M545 Low back pain, unspecified: Secondary | ICD-10-CM

## 2019-02-14 DIAGNOSIS — G8929 Other chronic pain: Secondary | ICD-10-CM | POA: Insufficient documentation

## 2019-02-14 NOTE — Therapy (Signed)
[place], [place], [address] Phone: [phone]   Fax:  [phone]  Physical Therapy Evaluation  Patient Details  Name: Latoya Stark MRN: [id number] Date of Birth: Jan 22, 1995 No data recorded  Encounter Date: 02/14/2019  PT End of Session - 02/14/19 1306    Visit Number  1    Number of Visits  4    Date for PT Re-Evaluation  04/11/19    Authorization Type  MCD, submitted for 3 visits on 02/14/2019    Authorization - Visit Number  1    Authorization - Number of Visits  4    PT Start Time  [id number]    PT Stop Time  1230    PT Time Calculation (min)  55 min    Equipment Utilized During Treatment  Gait belt    Activity Tolerance  Patient tolerated treatment well    Behavior During Therapy  WFL for tasks assessed/performed       Past Medical History:  Diagnosis Date  . ADHD (attention deficit hyperactivity disorder)   . Anemia   . Anxiety   . Asthma   . Chronic migraine 08/01/2018  . Depression   . Gonorrhea in female 10/05/2013  . 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 Headache   . Obesity   . Seizures ([place])    "when I get too hot"  . Trichomonas infection   . UTI (lower urinary tract infection)     Past Surgical History:  Procedure Laterality Date  . ADENOIDECTOMY    . DILATION AND EVACUATION N/A 08/09/2013   Procedure: DILATATION AND EVACUATION;  Surgeon: Frederico Hamman, MD;  Location: [facility];  Service: Gynecology;  Laterality: N/A;  . ORTHOPEDIC SURGERY Left    left hand  . TONSILLECTOMY      There were no vitals filed for this visit.   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 Pt with Right sided weakness Pt is [redacted] weeks pregnant due on 03/05/2019. Marland KitchenPt reporting 7/10 low back pain.  Pt arriving with her rolling walker, but reports she has an access to a manual wheelchair. Pt reporting her  seizures can be brought on with heat (getting hot).    Pertinent History  h/o seizures since age 1, Todd's paralysis (right sided weakness), h/o L knee ACL repair, L ankle fx, L hand surgery, asthma, adnoids and tonsillectomy, depression, ADHD    Limitations  Standing;Walking;House hold activities;Lifting;Other (comment)    Currently in Pain?  Yes    Pain Score  7     Pain Location  Back    Pain Orientation  Lower    Pain Descriptors / Indicators  Aching;Sore    Pain Type  Chronic pain    Pain Radiating Towards  sometimes, goes down left LE    Pain Onset  More than a month ago    Pain Frequency  Intermittent    Aggravating Factors   prolonged sitting, bending, sitting straight up, standing for long period    Pain Relieving Factors  slumping, heating pad         [facility] PT Assessment - 02/14/19 0001      Precautions   Precautions  None      Restrictions   Weight Bearing Restrictions  No      Balance Screen   Has the patient fallen in the past 6  months  Yes    How many times?  6    Has the patient had a decrease in activity level because of a fear of falling?   Yes    Is the patient reluctant to leave their home because of a fear of falling?   Yes   pt fearful of falling down her stairs     [place] residence    Living Arrangements  Spouse/significant other    Available Help at Discharge  Family    Type of [place] to enter    Entrance Stairs-Number of Steps  30 steps, 2nd floor apartment    Entrance Stairs-Rails  Can reach both    [place]  One level    [place] - 2 wheels;Bedside commode;Wheelchair - manual      Prior Function   Level of Independence  Independent    Vocation  Full time employment    Doctor, general practice, Librarian, academic at [employer]    Leisure  playing with my nieces and nephews, spending time with family      Cognition   Overall Cognitive Status   Within Functional Limits for tasks assessed      Observation/Other Assessments   Focus on Therapeutic Outcomes (FOTO)   deferred due to MCD      Sensation   Light Touch  Impaired by gross assessment    Additional Comments  Dermatomes: L3-5      Posture/Postural Control   Posture/Postural Control  Postural limitations    Postural Limitations  Rounded Shoulders;Forward head;Increased lumbar lordosis;Increased thoracic kyphosis      ROM / Strength   AROM / PROM / Strength  Strength      Strength   Overall Strength Comments  L LE grossly 5/5    Strength Assessment Site  Hip;Knee;Ankle    Right/Left Hip  Right    Right Hip Flexion  2-/5    Right Hip Extension  2-/5    Right Hip ABduction  2-/5    Right Hip ADduction  2-/5    Right/Left Knee  Right    Right/Left Ankle  Right    Right Ankle Dorsiflexion  1/5    Right Ankle Plantar Flexion  1/5    Right Ankle Inversion  1/5    Right Ankle Eversion  1/5      Palpation   Palpation comment  TTP lumbar paraspinals      Bed Mobility   Bed Mobility  Supine to Sit;Sit to Supine    Supine to Sit  Moderate Assistance - Patient 50-74%    Sit to Supine  Moderate Assistance - Patient 50-74%      Transfers   Five time sit to stand comments   18 seconds with single sit to stand using RW and UE for push off from chair with bilateral arm rests    Comments  --      Ambulation/Gait   Ambulation/Gait  Yes    Ambulation/Gait Assistance  6: Modified independent (Device/Increase time)    Ambulation Distance (Feet)  10 Feet    Assistive device  Rolling walker    Gait Pattern  Step-to pattern;Decreased step length - right;Decreased hip/knee flexion - right;Decreased dorsiflexion - right;Decreased weight shift to right;Poor foot clearance - right    Ambulation Surface  Level;Indoor    Pre-Gait Activities  pt requring increased time for all gait activities  Gait Comments  Pt with R foot supination during amb. Pt was instructed to observe her foot  postion before stepping to prevent lateral ankle roll.                 Objective measurements completed on examination: See above findings.                PT Short Term Goals - 02/14/19 1301      PT SHORT TERM GOAL #1   Title  Pt will be indepdent in her HEP.    Baseline  initial program issued on 02/14/2019    Time  4    Period  Weeks    Status  New    Target Date  03/13/19      PT SHORT TERM GOAL #2   Title  Pt will be able to perform sit to stand using proper techniques for push off using a rolling walker in less than 10 seconds    Baseline  18 seconds and instructions for hand placement for safety.    Time  4    Period  Weeks    Status  New    Target Date  03/13/19      PT SHORT TERM GOAL #3   Title  Pt will be able to perform supine to sit, sit to supine modified indepdently.    Baseline  requires moderate assistance    Time  4    Period  Weeks    Status  New    Target Date  03/13/19                Plan - 02/14/19 1221    Clinical Impression Statement  Pt is a 24 year old female who is [redacted] weeks pregnant  arriving to therapy s/p grand mal seizure 02/09/2019. Pt s/p Todd's paralysis on the R side affecting the R LE. Pt with only trace movements in her R LE with decreased sensation in dermatomes L3-5. Pt amb with RW with decreased wt shifting to R, decresad foot clearnace, decrresad step length on the R and step to gait pattern. Pt also reporting low back pain. Pt is amb using compensatory stradgies.Pt reporting having to have assistance with stair navigation to get in and out of her apartment and her work place. Currently pt reporting she is working from home.  Skilled PT needed to address pt's impairments with the below interventions.    Personal Factors and Comorbidities  Comorbidity 3+;Transportation    Comorbidities  seizure disorder, Todd's paralysis, conversion disorder, [redacted] weeks pregnant, H/o: L knee surgery, L ankle fx, L hand surgery, ADHD,  depression, anxiety, asthma    Examination-Activity Limitations  Bathing;Bed Mobility;Caring for Others;Carry;Dressing;Lift;Hygiene/Grooming;Stand;Stairs;Squat;Sit;Transfers    Examination-Participation Restrictions  Community Activity;Driving;Other    Stability/Clinical Decision Making  Stable/Uncomplicated    Clinical Decision Making  Moderate    Rehab Potential  Fair    PT Treatment/Interventions  ADLs/Self Care Home Management;Cryotherapy;Moist Heat;Neuromuscular re-education;Balance training;Therapeutic exercise;Therapeutic activities;Functional mobility training;Stair training;Gait training;Patient/family education;Manual techniques;Passive range of motion;Energy conservation;Taping    PT Next Visit Plan  LE strengtheing, transfer skills, gait training, add to pt's HEP as appropriate    PT Home Exercise Plan  Access Code: [email or web address] (quad sets, ball squeezes, ankle pumps with towel for assistance, modified bridges)    Consulted and Agree with Plan of Care  Patient       Patient will benefit from skilled therapeutic intervention in order to improve the following deficits and impairments:  Abnormal  gait, Difficulty walking, Obesity, Pain, Decreased balance, Decreased activity tolerance, Decreased mobility, Decreased strength, Postural dysfunction, Impaired flexibility  Visit Diagnosis: Muscle weakness (generalized)  Other abnormalities of gait and mobility  Difficulty in walking, not elsewhere classified  Chronic bilateral low back pain without sciatica     Problem List Patient Active Problem List   Diagnosis Date Noted  . Conversion disorder 02/10/2019  . 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 Seizure disorder in pregnancy ([place]) 11/23/2018  . UTI (urinary tract infection) in pregnancy in first trimester 08/04/2018  . 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 Chronic migraine 08/01/2018  . Supervision of high risk pregnancy, antepartum 07/30/2018  . Maternal obesity  affecting pregnancy, antepartum 07/30/2018  . History of seizure 07/30/2018    Oretha Caprice, PT 02/14/2019, 1:10 PM  [place], [place], [address] Phone: [phone]   Fax:  [phone]  Name: Latoya Stark MRN: [id number] Date of Birth: 1995/12/19

## 2019-02-18 ENCOUNTER — Encounter: Payer: Self-pay | Admitting: Obstetrics & Gynecology

## 2019-02-18 ENCOUNTER — Telehealth (INDEPENDENT_AMBULATORY_CARE_PROVIDER_SITE_OTHER): Payer: Medicaid Other | Admitting: Obstetrics & Gynecology

## 2019-02-18 VITALS — BP 129/76 | HR 100

## 2019-02-18 DIAGNOSIS — G8384 Todd's paralysis (postepileptic): Secondary | ICD-10-CM

## 2019-02-18 DIAGNOSIS — G40909 Epilepsy, unspecified, not intractable, without status epilepticus: Secondary | ICD-10-CM | POA: Diagnosis not present

## 2019-02-18 DIAGNOSIS — O0993 Supervision of high risk pregnancy, unspecified, third trimester: Secondary | ICD-10-CM | POA: Diagnosis not present

## 2019-02-18 DIAGNOSIS — O99353 Diseases of the nervous system complicating pregnancy, third trimester: Secondary | ICD-10-CM | POA: Diagnosis not present

## 2019-02-18 DIAGNOSIS — Z3400 Encounter for supervision of normal first pregnancy, unspecified trimester: Secondary | ICD-10-CM

## 2019-02-18 DIAGNOSIS — Z3A37 37 weeks gestation of pregnancy: Secondary | ICD-10-CM

## 2019-02-18 DIAGNOSIS — O0990 Supervision of high risk pregnancy, unspecified, unspecified trimester: Secondary | ICD-10-CM

## 2019-02-18 NOTE — Progress Notes (Signed)
PATIENT: Latoya Stark DOB: Mar 14, 1995  REASON FOR VISIT: follow up HISTORY FROM: patient  Chief Complaint  Patient presents with  . Follow-up    3 mon f/u. Guest present. Rm 9. Patient mentioned that she had a few seizures since her last office visit. She stated that her last seizure happened when she was laughing and joking around and it happened suddenly.      HISTORY OF PRESENT ILLNESS: Today 02/19/19 Latoya Stark is a 24 y.o. female here today for follow up for seizure. She was recently hospitalized for breakthrough seizure after admittedly missing two doses of her levetiracetam. She complained of right leg paralysis following seizure. Imaging was reassuring and she was cleared by neurology. Patient had positive Hoover's sign and concerns of give away weakness on exam. 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 She is doing fairly well. She is participating in OT. She is doing exercises at home. She reports that she has feeling in her leg now. She is able to walk with a walker. She was last seen by OB yesterday.   She plans to deliver her baby boy, [name], at [facility]. She lives with her boyfriend. He works nights. He has two children with his ex.    HISTORY: (copied from my note on 11/18/2018)  Latoya Stark is a 24 y.o. female here today for follow up for seizures. She is currently [redacted] weeks pregnant. She continues levetiracetam 750mg  TID.  She reports that she has had 2 seizures since last being seen in July.  Her fianc is here with her today and reports that her last seizure was November 2.  She was sitting on the toilet and noted feeling hot and dizzy.  He reports that she had convulsions for approximately 2 minutes.  He states that she did urinate in the toilet while having convulsions.  He feels that the past 2 seizures have been much shorter and less severe than her previous seizures.  She continues to have migraines but feels that these have  improved significantly.  She is tolerating levetiracetam well with no obvious adverse effects.  She is followed closely by OB.  Last appointment was October 23.  She is scheduled for follow-up again on November 23.  HISTORY: (copied from Dr Garth Bigness note on 08/01/2018)  I had the pleasure of seeing your patient, [name] for neurologic consultation regarding her headaches and seizures.  She is a 24 year old womanwho has a long history of headaches and seizures since age 87. She is currently 8 to [redacted] weeks pregnant.  She has had migraine headaches for yearsbut the frequency has increased over the past few months. Headaches are daily and located bilaterally, forehead >temporal region. She gets nausea, severe photophobia and phonophobia. Headaches usually improve if she sleeps them off. Heat triggers the headache. Tylenol sometimes stops the headaches. Topamax had helped her headaches with benefit.   She had her first seizure at age 1. 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 Her last one was early July 2020. Her previous one had been a month or two earlier (while on Dilantin). She has been having one every 2-3 months, even while on treatment. She stopped Dilantin 4 weeks ago (10 days or so before the last seizure) due to pregnancy. She is currently 8-[redacted] weeks pregnant.   She has not tried other medications besides Dilantin.She has seen Dr.  Hickling in the past.   REVIEW OF SYSTEMS: Out of a complete 14 system review of symptoms, the patient complains only of the following symptoms, seizure, numbness of right thighand all other reviewed systems are negative.  ALLERGIES: Allergies  Allergen Reactions  . Milk-Related Compounds Dermatitis    Breaks face out *can have 2% milk*  . Mushroom Extract Complex Anaphylaxis  . Penicillins Anaphylaxis    Has patient had  a PCN reaction causing immediate rash, facial/tongue/throat swelling, SOB or lightheadedness with hypotension: Yes Has patient had a PCN reaction causing severe rash involving mucus membranes or skin necrosis: No Has patient had a PCN reaction that required hospitalization No Has patient had a PCN reaction occurring within the last 10 years: Yes If all of the above answers are "NO", then may proceed with Cephalosporin use.   [name] Other (See Comments)    Breaks face out  . Latex Itching and Swelling  . Sulfa Antibiotics Swelling  . Vicodin [Hydrocodone-Acetaminophen] Hives    HOME MEDICATIONS: Outpatient Medications Prior to Visit  Medication Sig Dispense Refill  . acetaminophen (TYLENOL) 500 MG tablet Take 1,000 mg by mouth every 6 (six) hours as needed for headache.    . albuterol (PROVENTIL HFA;VENTOLIN HFA) 108 (90 Base) MCG/ACT inhaler Inhale 2 puffs into the lungs every 6 (six) hours as needed for wheezing or shortness of breath. 1 Inhaler 0  . aspirin EC 81 MG tablet Take 1 tablet (81 mg total) by mouth daily. Take after 12 weeks for prevention of preeclampsia later in pregnancy 300 tablet 2  . Blood Pressure Monitoring (BLOOD PRESSURE CUFF) MISC 1 Device by Does not apply route once a week. 1 each 0  . cyclobenzaprine (FLEXERIL) 10 MG tablet Take 1 tablet (10 mg total) by mouth 3 (three) times daily as needed for muscle spasms (back pain). 30 tablet 0  . docusate sodium (COLACE) 100 MG capsule Take 1 capsule (100 mg total) by mouth 2 (two) times daily. 60 capsule 2  . Doxylamine-Pyridoxine (DICLEGIS) 10-10 MG TBEC Take 2 tablets by mouth at bedtime. If symptoms persist, add one tablet in the morning and one in the afternoon 100 tablet 5  . Elastic Bandages & Supports (COMFORT FIT MATERNITY SUPP LG) MISC 1 application by Does not apply route daily. 1 each 0  . folic acid (FOLVITE) 1 MG tablet Take 1 tablet (1 mg total) by mouth daily. 30 tablet 10  . metoCLOPramide (REGLAN) 5 MG  tablet Take 1 tablet (5 mg total) by mouth 3 (three) times daily. 90 tablet 1  . ondansetron (ZOFRAN ODT) 4 MG disintegrating tablet Take 1 tablet (4 mg total) by mouth every 8 (eight) hours as needed for nausea or vomiting. 20 tablet 0  . pantoprazole (PROTONIX) 40 MG tablet Take 1 tablet (40 mg total) by mouth daily. 30 tablet 1  . polyethylene glycol (MIRALAX / GLYCOLAX) 17 g packet Take 17 g by mouth 2 (two) times daily. 24 each 3  . Prenat w/o A-FeCbn-Meth-FA-DHA (PRENATE MINI) 29-0.6-0.4-350 MG CAPS Take 1 capsule by mouth daily before breakfast. 30 capsule 11  . Promethazine HCl (PHENERGAN PO) Take 25 mg by mouth every 6 (six) hours as needed (nausea).     . senna (SENOKOT) 8.6 MG TABS tablet Take 1 tablet (8.6 mg total) by mouth daily. 120 tablet 0  . levETIRAcetam (KEPPRA) 750 MG tablet Take 2 tablets (1,500 mg total) by mouth 2 (two) times daily. 120 tablet 2   No facility-administered  medications prior to visit.    PAST MEDICAL HISTORY: Past Medical History:  Diagnosis Date  . ADHD (attention deficit hyperactivity disorder)   . Anemia   . Anxiety   . Asthma   . Chronic migraine 08/01/2018  . Depression   . Gonorrhea in female 10/05/2013  . 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 Headache   . Obesity   . Seizures ([place])    "when I get too hot"  . Trichomonas infection   . UTI (lower urinary tract infection)     PAST SURGICAL HISTORY: Past Surgical History:  Procedure Laterality Date  . ADENOIDECTOMY    . DILATION AND EVACUATION N/A 08/09/2013   Procedure: DILATATION AND EVACUATION;  Surgeon: Frederico Hamman, MD;  Location: [facility];  Service: Gynecology;  Laterality: N/A;  . ORTHOPEDIC SURGERY Left    left hand  . TONSILLECTOMY      FAMILY HISTORY: Family History  Problem Relation Age of Onset  . Seizures Mother   . Stroke Mother   . Hypertension Father   . Diabetes Maternal Grandmother   . Colon cancer Maternal Grandmother   . Hypertension Maternal  Grandmother   . Clotting disorder Maternal Grandmother   . Breast cancer Maternal Grandmother   . Stroke Maternal Grandmother   . Diabetes Paternal Grandmother   . Seizures Maternal Aunt     SOCIAL HISTORY: Social History   Socioeconomic History  . Marital status: Single    Spouse name: Not on file  . Number of children: 0  . Years of education: Not on file  . Highest education level: Not on file  Occupational History  . Not on file  Tobacco Use  . Smoking status: Never Smoker  . Smokeless tobacco: Never Used  Substance and Sexual Activity  . Alcohol use: No    Alcohol/week: 0.0 standard drinks  . Drug use: No  . Sexual activity: Yes    Partners: Male    Birth control/protection: None    Comment: one partner  Other Topics Concern  . Not on file  Social History Narrative   Caffeine: tea sometimes   Lives with fiance   Right handed    Social Determinants of Health   Financial Resource Strain: Low Risk   . Difficulty of Paying Living Expenses: Not hard at all  Food Insecurity: Food Insecurity Present  . Worried About Charity fundraiser in the Last Year: Sometimes true  . Ran Out of Food in the Last Year: Sometimes true  Transportation Needs: No Transportation Needs  . Lack of Transportation (Medical): No  . Lack of Transportation (Non-Medical): No  Physical Activity: Unknown  . Days of Exercise per Week: 0 days  . Minutes of Exercise per Session: Not on file  Stress: No Stress Concern Present  . Feeling of Stress : Not at all  Social Connections: Moderately Isolated  . Frequency of Communication with Friends and Family: More than three times a week  . Frequency of Social Gatherings with Friends and Family: More than three times a week  . Attends Religious Services: Never  . Active Member of Clubs or Organizations: No  . Attends Archivist Meetings: Never  . Marital Status: Never married  Intimate Partner Violence: Not At Risk  . Fear of Current or  Ex-Partner: No  . Emotionally Abused: No  . Physically Abused: No  . Sexually Abused: No      PHYSICAL EXAM  Vitals:   02/19/19 0930  BP: 132/64  Pulse: (!) 107  Temp: (!) 96.9 F (36.1 C)  TempSrc: Oral  Weight: (!) 321 lb 3.2 oz (145.7 kg)  Height: 5\' 4"  (1.626 m)   Body mass index is 55.13 kg/m.  Generalized: Well developed, in no acute distress  Cardiology: normal rate and rhythm, no murmur noted Respiratory: clear to auscultation bilaterally  Neurological examination  Mentation: Alert oriented to time, place, history taking. Follows all commands speech and language fluent Cranial nerve II-XII: Pupils were equal round reactive to light. Extraocular movements were full, visual field were full on confrontational test. Facial sensation and strength were normal. Uvula tongue midline. Head turning and shoulder shrug  were normal and symmetric. Motor: The motor testing reveals 5 over 5 strength of all 4 extremities. No hip flexion weakness noted of right lower. Good symmetric motor tone is noted throughout.  Sensory: Sensory testing is decreased from right knee down to soft touch and pinprick testing, normal of left lower extremity  Coordination: Cerebellar testing reveals good finger-nose-finger and heel-to-shin bilaterally.  Gait and station: Gait is stable, very slight right limp with walker   DIAGNOSTIC DATA (LABS, IMAGING, TESTING) - I reviewed patient records, labs, notes, testing and imaging myself where available.  No flowsheet data found.   Lab Results  Component Value Date   WBC 6.2 02/10/2019   HGB 10.3 (L) 02/10/2019   HCT 32.6 (L) 02/10/2019   MCV 89.3 02/10/2019   PLT 263 02/10/2019      Component Value Date/Time   NA 137 02/10/2019 0659   NA 144 08/04/2015 1346   K 3.8 02/10/2019 0659   CL 104 02/10/2019 0659   CO2 22 02/10/2019 0659   GLUCOSE 125 (H) 02/10/2019 0659   BUN <5 (L) 02/10/2019 0659   BUN 8 08/04/2015 1346   CREATININE 0.46 02/10/2019  0659   CALCIUM 9.0 02/10/2019 0659   PROT 5.5 (L) 02/10/2019 0659   PROT 7.1 08/04/2015 1346   ALBUMIN 2.6 (L) 02/10/2019 0659   ALBUMIN 4.3 08/04/2015 1346   AST 20 02/10/2019 0659   ALT 19 02/10/2019 0659   ALKPHOS 139 (H) 02/10/2019 0659   BILITOT 0.4 02/10/2019 0659   BILITOT 0.3 08/04/2015 1346   GFRNONAA >60 02/10/2019 0659   GFRAA >60 02/10/2019 0659   Lab Results  Component Value Date   CHOL  12/13/2006    122        ATP III CLASSIFICATION:  <200     mg/dL   Desirable  200-239  mg/dL   Borderline High  >=240    mg/dL   High   HDL 30 (L) 12/13/2006   LDLCALC  12/13/2006    76        Total Cholesterol/HDL:CHD Risk Coronary Heart Disease Risk Table                     Men   Women  1/2 Average Risk   3.4   3.3   TRIG 78 12/13/2006   CHOLHDL 4.1 12/13/2006   Lab Results  Component Value Date   HGBA1C 5.6 07/30/2018   Lab Results  Component Value Date   VITAMINB12 309 02/24/2014   Lab Results  Component Value Date   TSH 1.770 03/15/2018       ASSESSMENT AND PLAN 24 y.o. 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 here with     ICD-10-CM   1. 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 [redacted] weeks gestation of pregnancy  Z3A.37   3. 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 She is taking levetiracetam every 12 hours as directed. She is participating in OT and feels that sensation is returning. She was given seizure and fall precautions. She was advised not to drive. She was educated on importance of medication compliance, well balanced diet and adequate hydration. She will follow up closely with OB. She will follow up  with me in 3 months. She verbalizes understanding and agreement with this plan.    No orders of the defined types were placed in this encounter.    Meds ordered this encounter  Medications  . levETIRAcetam (KEPPRA) 750 MG tablet    Sig: Take 2 tablets (1,500 mg total) by mouth 2 (two) times daily.    Dispense:  120 tablet    Refill:  2    Dose was increased. Discontinue twice daily dosing on file    Order Specific Question:   Supervising Provider    Answer:   Melvenia Beam [id number]       Debbora Presto, FNP-C 02/19/2019, 10:11 AM [facility] [address], [place], [place] [address] [phone]

## 2019-02-18 NOTE — Progress Notes (Signed)
   TELEHEALTH VIRTUAL OBSTETRICS VISIT ENCOUNTER NOTE  I connected with Latoya Stark on 02/18/19 at  8:45 AM EST by telephone at home and verified that I am speaking with the correct person using two identifiers.   I discussed the limitations, risks, security and privacy concerns of performing an evaluation and management service by telephone and the availability of in person appointments. I also discussed with the patient that there may be a patient responsible charge related to this service. The patient expressed understanding and agreed to proceed.  Subjective:  Latoya Stark is a 24 y.o. G5P0030 at [redacted]w[redacted]d being followed for ongoing prenatal care.  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  Patient reports leg weakness that occurred after seizure is improved. Reports fetal movement. Denies any contractions, bleeding or leaking of fluid.   The following portions of the patient's history were reviewed and updated as appropriate: allergies, current medications, past family history, past medical history, past social history, past surgical history and problem list.   Objective:   General:  Alert, oriented and cooperative.   Mental Status: Normal mood and affect perceived. Normal judgment and thought content.  Rest of physical exam deferred due to type of encounter  Assessment and Plan:  Pregnancy: G5P0030 at [redacted]w[redacted]d 1. Supervision of high risk pregnancy, antepartum S/p seizure, on medication and followed by neurology  2. 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 Seizure disorder during pregnancy in  third trimester ([place])   4. Supervision of normal first pregnancy, antepartum O/w doing well  Term labor symptoms and general obstetric precautions including but not limited to vaginal bleeding, contractions, leaking of fluid and fetal movement were reviewed in detail with the patient.  I discussed the assessment and treatment plan with the patient. The patient was provided an opportunity to ask questions and all were answered. The patient agreed with the plan and demonstrated an understanding of the instructions. The patient was advised to call back or seek an in-person office evaluation/go to MAU at [facility] for any urgent or concerning symptoms. Please refer to After Visit Summary for other counseling recommendations.   I provided 10 minutes of non-face-to-face time during this encounter.  Return in about 1 week (around 02/25/2019) for virtual or in person.  Future Appointments  Date Time Provider [place]  02/19/2019  9:30 AM Debbora Presto, NP [facility] None  02/26/2019 10:00 AM Aliene Altes [facility]  02/28/2019 11:30 AM Clint Guy, PT [facility]  03/03/2019 10:00 AM Aliene Altes [facility]  03/24/2019 10:30 AM Kerin Perna, NP [facility] None    Emeterio Reeve, MD Center for [facility], [place]

## 2019-02-18 NOTE — Progress Notes (Signed)
S/w pt for virtual visit, reports fetal movement with irregular contractions.

## 2019-02-18 NOTE — Patient Instructions (Signed)

## 2019-02-19 ENCOUNTER — Encounter: Payer: Self-pay | Admitting: Family Medicine

## 2019-02-19 ENCOUNTER — Other Ambulatory Visit: Payer: Self-pay

## 2019-02-19 ENCOUNTER — Ambulatory Visit: Payer: Medicaid Other | Admitting: Family Medicine

## 2019-02-19 VITALS — BP 132/64 | HR 107 | Temp 96.9°F | Ht 64.0 in | Wt 321.2 lb

## 2019-02-19 DIAGNOSIS — G8384 Todd's paralysis (postepileptic): Secondary | ICD-10-CM | POA: Diagnosis not present

## 2019-02-19 DIAGNOSIS — G40409 Other generalized epilepsy and epileptic syndromes, not intractable, without status epilepticus: Secondary | ICD-10-CM

## 2019-02-19 DIAGNOSIS — Z3A37 37 weeks gestation of pregnancy: Secondary | ICD-10-CM

## 2019-02-19 MED ORDER — LEVETIRACETAM 750 MG PO TABS: 1500.0000 mg | ORAL_TABLET | Freq: Two times a day (BID) | ORAL | 2 refills | Status: DC

## 2019-02-19 NOTE — Patient Instructions (Addendum)
Continue levetiracetam 750mg  twice daily   Make sure to stay well hydrated   Follow up in 3 months   According to [place] law, you can not drive unless you are seizure / syncope free for at least 6 months and under physician's care.  Please maintain precautions. Do not participate in activities where a loss of awareness could harm you or someone else. No swimming alone, no tub bathing, no hot tubs, no driving, no operating motorized vehicles (cars, ATVs, motocycles, etc), lawnmowers, power tools or firearms. No standing at heights, such as rooftops, ladders or stairs. Avoid hot objects such as stoves, heaters, open fires. Wear a helmet when riding a bicycle, scooter, skateboard, etc. and avoid areas of traffic. Set your water heater to 120 degrees or less.    Seizure, Adult A seizure is a sudden burst of abnormal electrical activity in the brain. Seizures usually last from 30 seconds to 2 minutes. They can cause many different symptoms. Usually, seizures are not harmful unless they last a long time. What are the causes? Common causes of this condition include:  Fever or infection.  Conditions that affect the brain, such as: ? A brain abnormality that you were born with. ? A brain or head injury. ? Bleeding in the brain. ? A tumor. ? Stroke. ? Brain disorders such as autism or cerebral palsy.  Low blood sugar.  Conditions that are passed from parent to child (are inherited).  Problems with substances, such as: ? Having a reaction to a drug or a medicine. ? Suddenly stopping the use of a substance (withdrawal). In some cases, the cause may not be known. A person who has repeated seizures over time without a clear cause has a condition called epilepsy. What increases the risk? You are more likely to get this condition if you have:  A family history of epilepsy.  Had a seizure in the past.  A brain disorder.  A history of head injury, lack of oxygen at birth, or strokes. What are  the signs or symptoms? There are many types of seizures. The symptoms vary depending on the type of seizure you have. Examples of symptoms during a seizure include:  Shaking (convulsions).  Stiffness in the body.  Passing out (losing consciousness).  Head nodding.  Staring.  Not responding to sound or touch.  Loss of bladder control and bowel control. Some people have symptoms right before and right after a seizure happens. Symptoms before a seizure may include:  Fear.  Worry (anxiety).  Feeling like you may vomit (nauseous).  Feeling like the room is spinning (vertigo).  Feeling like you saw or heard something before (dj vu).  Odd tastes or smells.  Changes in how you see. You may see flashing lights or spots. Symptoms after a seizure happens can include:  Confusion.  Sleepiness.  Headache.  Weakness on one side of the body. How is this treated? Most seizures will stop on their own in under 5 minutes. In these cases, no treatment is needed. Seizures that last longer than 5 minutes will usually need treatment. Treatment can include:  Medicines given through an IV tube.  Avoiding things that are known to cause your seizures. These can include medicines that you take for another condition.  Medicines to treat epilepsy.  Surgery to stop the seizures. This may be needed if medicines do not help. Follow these instructions at home: Medicines  Take over-the-counter and prescription medicines only as told by your doctor.  Do not  eat or drink anything that may keep your medicine from working, such as alcohol. Activity  Do not do any activities that would be dangerous if you had another seizure, like driving or swimming. Wait until your doctor says it is safe for you to do them.  If you live in the [place] ask your local DMV (department of motor vehicles) when you can drive.  Get plenty of rest. Teaching others Teach friends and family what to do when you have a  seizure. They should:  Lay you on the ground.  Protect your head and body.  Loosen any tight clothing around your neck.  Turn you on your side.  Not hold you down.  Not put anything into your mouth.  Know whether or not you need emergency care.  Stay with you until you are better.  General instructions  Contact your doctor each time you have a seizure.  Avoid anything that gives you seizures.  Keep a seizure diary. Write down: ? What you think caused each seizure. ? What you remember about each seizure.  Keep all follow-up visits as told by your doctor. This is important. Contact a doctor if:  You have another seizure.  You have seizures more often.  There is any change in what happens during your seizures.  You keep having seizures with treatment.  You have symptoms of being sick or having an infection. Get help right away if:  You have a seizure that: ? Lasts longer than 5 minutes. ? Is different than seizures you had before. ? Makes it harder to breathe. ? Happens after you hurt your head.  You have any of these symptoms after a seizure: ? Not being able to speak. ? Not being able to use a part of your body. ? Confusion. ? A bad headache.  You have two or more seizures in a row.  You do not wake up right after a seizure.  You get hurt during a seizure. These symptoms may be an emergency. Do not wait to see if the symptoms will go away. Get medical help right away. Call your local emergency services (911 in the [place] Do not drive yourself to the hospital. Summary  Seizures usually last from 30 seconds to 2 minutes. Usually, they are not harmful unless they last a long time.  Do not eat or drink anything that may keep your medicine from working, such as alcohol.  Teach friends and family what to do when you have a seizure.  Contact your doctor each time you have a seizure. This information is not intended to replace advice given to you by your  health care provider. Make sure you discuss any questions you have with your health care provider. Document Revised: 03/15/2018 Document Reviewed: 03/15/2018 Elsevier Patient Education  [place].

## 2019-02-19 NOTE — Progress Notes (Signed)
I have read the note, and I agree with the clinical assessment and plan.  Dara Camargo A. Latysha Thackston, MD, PhD, FAAN Certified in Neurology, Clinical Neurophysiology, Sleep Medicine, Pain Medicine and Neuroimaging  [facility] [address], [address] [place], [place] [address] [phone]  

## 2019-02-20 ENCOUNTER — Encounter (HOSPITAL_COMMUNITY): Payer: Self-pay | Admitting: Obstetrics and Gynecology

## 2019-02-20 ENCOUNTER — Inpatient Hospital Stay (HOSPITAL_COMMUNITY)
Admission: AD | Admit: 2019-02-20 | Discharge: 2019-02-21 | Disposition: A | Discharge status: Home / Self Care | Payer: Medicaid Other | Attending: Obstetrics and Gynecology | Admitting: Obstetrics and Gynecology

## 2019-02-20 ENCOUNTER — Encounter: Payer: Self-pay | Admitting: Obstetrics and Gynecology

## 2019-02-20 DIAGNOSIS — G40409 Other generalized epilepsy and epileptic syndromes, not intractable, without status epilepticus: Secondary | ICD-10-CM | POA: Diagnosis not present

## 2019-02-20 DIAGNOSIS — E669 Obesity, unspecified: Secondary | ICD-10-CM | POA: Insufficient documentation

## 2019-02-20 DIAGNOSIS — Z7982 Long term (current) use of aspirin: Secondary | ICD-10-CM | POA: Insufficient documentation

## 2019-02-20 DIAGNOSIS — O99353 Diseases of the nervous system complicating pregnancy, third trimester: Secondary | ICD-10-CM | POA: Diagnosis present

## 2019-02-20 DIAGNOSIS — O99213 Obesity complicating pregnancy, third trimester: Secondary | ICD-10-CM | POA: Diagnosis not present

## 2019-02-20 DIAGNOSIS — Z3A37 37 weeks gestation of pregnancy: Secondary | ICD-10-CM | POA: Diagnosis not present

## 2019-02-20 DIAGNOSIS — O99513 Diseases of the respiratory system complicating pregnancy, third trimester: Secondary | ICD-10-CM | POA: Diagnosis not present

## 2019-02-20 DIAGNOSIS — J45909 Unspecified asthma, uncomplicated: Secondary | ICD-10-CM | POA: Insufficient documentation

## 2019-02-20 DIAGNOSIS — Z79899 Other long term (current) drug therapy: Secondary | ICD-10-CM | POA: Diagnosis not present

## 2019-02-20 DIAGNOSIS — O26893 Other specified pregnancy related conditions, third trimester: Secondary | ICD-10-CM | POA: Diagnosis not present

## 2019-02-20 DIAGNOSIS — G40909 Epilepsy, unspecified, not intractable, without status epilepticus: Secondary | ICD-10-CM

## 2019-02-20 DIAGNOSIS — Z6841 Body Mass Index (BMI) 40.0 and over, adult: Secondary | ICD-10-CM | POA: Insufficient documentation

## 2019-02-20 LAB — CBC
HCT: 34.3 % — ABNORMAL LOW (ref 36.0–46.0)
Hemoglobin: 11.3 g/dL — ABNORMAL LOW (ref 12.0–15.0)
MCH: 28.3 pg (ref 26.0–34.0)
MCHC: 32.9 g/dL (ref 30.0–36.0)
MCV: 86 fL (ref 80.0–100.0)
Platelets: 302 10*3/uL (ref 150–400)
RBC: 3.99 MIL/uL (ref 3.87–5.11)
RDW: 13.5 % (ref 11.5–15.5)
WBC: 7.1 10*3/uL (ref 4.0–10.5)
nRBC: 0 % (ref 0.0–0.2)

## 2019-02-20 MED ORDER — MAGNESIUM SULFATE 40 GM/1000ML IV SOLN: 2.0000 g/h | INTRAVENOUS | Status: DC

## 2019-02-20 MED ORDER — MAGNESIUM SULFATE BOLUS VIA INFUSION
2.0000 g | Freq: Once | INTRAVENOUS | Status: AC
  Administered 2019-02-20: 2 g via INTRAVENOUS
  Filled 2019-02-20: qty 1000

## 2019-02-20 NOTE — MAU Provider Note (Signed)
Chief Complaint:  Seizures, Vaginal Discharge, and Abdominal Pain   Seen by provider at 2330    HPI: Latoya Stark is a 24 y.o. [id number] at 2w6dwho presents to maternity admissions reporting having had a grand mal seizure for 2 min at home.  Had reported abdominal pain prior to that.  Was given Magnesium Sulfate 4gm bolus on EMS truck.  They report clear vaginal discharge. . ROS incomplete due to level 5 Caveat (post ictal state)  Recently admitted here on 02/10/19 Seen by Neurology yesterday.  Summary below: Latoya Stark is a 24 y.o. female here today for follow up for seizure. She was recently hospitalized for breakthrough seizure after admittedly missing two doses of her levetiracetam. She complained of right leg paralysis following seizure. Imaging was reassuring and she was cleared by neurology. Patient had positive Hoover's sign and concerns of give away weakness on exam. 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 She is doing fairly well. She is participating in OT. She is doing exercises at home. She reports that she has feeling in her leg now. She is able to walk with a walker. She was last seen by OB yesterday.   RN note: Patient reports to MAU via EMS following a grand mal seizure that lasted 2 minutes. pts significant others told the EMS crew she had also been having abdominal pain. EMS stated she had clear vaginal discharge. Pt is post seizure and EMS states she has been having focal seizures. Pt was given 4gram bolus of magnesium on the truck  Past Medical History: Past Medical History:  Diagnosis Date  . ADHD (attention deficit hyperactivity disorder)   . Anemia   . Anxiety   . Asthma   . Chronic migraine 08/01/2018  . Depression   . Gonorrhea in female 10/05/2013  . 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 Headache   . Obesity   . Seizures ([place])    "when I get too hot"  . Trichomonas infection   . UTI (lower urinary tract infection)     Past  obstetric history: OB History  Gravida Para Term Preterm AB Living  5       3    SAB TAB Ectopic Multiple Live Births  3            # Outcome Date GA Lbr Len/2nd Weight Sex Delivery Anes PTL Lv  5 Current           4 SAB 10/10/14          3 Gravida           2 SAB           1 SAB              Birth Comments: System Generated. Please review and update pregnancy details.    Past Surgical History: Past Surgical History:  Procedure Laterality Date  . ADENOIDECTOMY    . DILATION AND EVACUATION N/A 08/09/2013   Procedure: DILATATION AND EVACUATION;  Surgeon: Frederico Hamman, MD;  Location: [facility];  Service: Gynecology;  Laterality: N/A;  . ORTHOPEDIC SURGERY Left    left hand  . TONSILLECTOMY      Family History: Family History  Problem Relation Age of Onset  . Seizures Mother   . Stroke Mother   . Hypertension Father   . Diabetes Maternal Grandmother   . Colon cancer Maternal Grandmother   . Hypertension Maternal Grandmother   . Clotting disorder  Maternal Grandmother   . Breast cancer Maternal Grandmother   . Stroke Maternal Grandmother   . Diabetes Paternal Grandmother   . Seizures Maternal Aunt     Social History: Social History   Tobacco Use  . Smoking status: Never Smoker  . Smokeless tobacco: Never Used  Substance Use Topics  . Alcohol use: No    Alcohol/week: 0.0 standard drinks  . Drug use: No    Allergies:  Allergies  Allergen Reactions  . Milk-Related Compounds Dermatitis    Breaks face out *can have 2% milk*  . Mushroom Extract Complex Anaphylaxis  . Penicillins Anaphylaxis    Has patient had a PCN reaction causing immediate rash, facial/tongue/throat swelling, SOB or lightheadedness with hypotension: Yes Has patient had a PCN reaction causing severe rash involving mucus membranes or skin necrosis: No Has patient had a PCN reaction that required hospitalization No Has patient had a PCN reaction occurring within the last 10 years: Yes If all of  the above answers are "NO", then may proceed with Cephalosporin use.   [name] Other (See Comments)    Breaks face out  . Latex Itching and Swelling  . Sulfa Antibiotics Swelling  . Vicodin [Hydrocodone-Acetaminophen] Hives    Meds:  Medications Prior to Admission  Medication Sig Dispense Refill Last Dose  . acetaminophen (TYLENOL) 500 MG tablet Take 1,000 mg by mouth every 6 (six) hours as needed for headache.   02/20/2019 at Unknown time  . aspirin EC 81 MG tablet Take 1 tablet (81 mg total) by mouth daily. Take after 12 weeks for prevention of preeclampsia later in pregnancy 300 tablet 2 02/20/2019 at Unknown time  . cyclobenzaprine (FLEXERIL) 10 MG tablet Take 1 tablet (10 mg total) by mouth 3 (three) times daily as needed for muscle spasms (back pain). 30 tablet 0 02/20/2019 at Unknown time  . docusate sodium (COLACE) 100 MG capsule Take 1 capsule (100 mg total) by mouth 2 (two) times daily. 60 capsule 2 02/20/2019 at Unknown time  . Doxylamine-Pyridoxine (DICLEGIS) 10-10 MG TBEC Take 2 tablets by mouth at bedtime. If symptoms persist, add one tablet in the morning and one in the afternoon 100 tablet 5 02/20/2019 at Unknown time  . folic acid (FOLVITE) 1 MG tablet Take 1 tablet (1 mg total) by mouth daily. 30 tablet 10 02/20/2019 at Unknown time  . levETIRAcetam (KEPPRA) 750 MG tablet Take 2 tablets (1,500 mg total) by mouth 2 (two) times daily. 120 tablet 2 02/20/2019 at Unknown time  . metoCLOPramide (REGLAN) 5 MG tablet Take 1 tablet (5 mg total) by mouth 3 (three) times daily. 90 tablet 1 02/20/2019 at Unknown time  . ondansetron (ZOFRAN ODT) 4 MG disintegrating tablet Take 1 tablet (4 mg total) by mouth every 8 (eight) hours as needed for nausea or vomiting. 20 tablet 0 02/20/2019 at Unknown time  . pantoprazole (PROTONIX) 40 MG tablet Take 1 tablet (40 mg total) by mouth daily. 30 tablet 1 02/20/2019 at Unknown time  . polyethylene glycol (MIRALAX / GLYCOLAX) 17 g packet Take 17 g by mouth 2  (two) times daily. 24 each 3 02/20/2019 at Unknown time  . Prenat w/o A-FeCbn-Meth-FA-DHA (PRENATE MINI) 29-0.6-0.4-350 MG CAPS Take 1 capsule by mouth daily before breakfast. 30 capsule 11 02/20/2019 at Unknown time  . Promethazine HCl (PHENERGAN PO) Take 25 mg by mouth every 6 (six) hours as needed (nausea).    02/20/2019 at Unknown time  . senna (SENOKOT) 8.6 MG TABS tablet Take 1  tablet (8.6 mg total) by mouth daily. 120 tablet 0 02/20/2019 at Unknown time  . albuterol (PROVENTIL HFA;VENTOLIN HFA) 108 (90 Base) MCG/ACT inhaler Inhale 2 puffs into the lungs every 6 (six) hours as needed for wheezing or shortness of breath. 1 Inhaler 0 Unknown at Unknown time  . Blood Pressure Monitoring (BLOOD PRESSURE CUFF) MISC 1 Device by Does not apply route once a week. 1 each 0 Unknown at Unknown time  . Elastic Bandages & Supports (COMFORT FIT MATERNITY SUPP LG) MISC 1 application by Does not apply route daily. 1 each 0     I have reviewed patient's Past Medical Hx, Surgical Hx, Family Hx, Social Hx, medications and allergies.   ROS:  Review of Systems  Gastrointestinal: Positive for abdominal pain.  Genitourinary: Positive for vaginal discharge.  Neurological: Positive for seizures.  Unable to complete ROS due to Level 5 Caveat   Physical Exam   Patient Vitals for the past 24 hrs:  BP Temp Temp src Resp SpO2  02/20/19 2338 123/81 98.5 F (36.9 C) Oral (!) 32 100 %   Vitals:   02/21/19 0033 02/21/19 0040 02/21/19 0045 02/21/19 0131  BP: 118/81   109/73  Pulse: (!) 101   (!) 102  Resp:    19  Temp:    97.7 F (36.5 C)  TempSrc:    Oral  SpO2: 100% 99% 100% 100%    Constitutional: Well-developed, well-nourished female in no acute distress.  Appears post-ictal, though immediately did answer yes to questions and when asked to open eyes, she raised her eyebrows.    Cardiovascular: normal rate and rhythm Respiratory: normal effort, clear to auscultation bilaterally GI: Abd soft, non-tender,  gravid appropriate for gestational age.   No rebound or guarding. MS: Extremities nontender, no edema, normal ROM Neurologic: Alert and oriented x 4.  GU: Neg CVAT.  PELVIC EXAM: Cervix pink, visually closed, without lesion, scant white creamy discharge, vaginal walls and external genitalia normal   No pooling and NO ferning  Cervix closed Long and very high Bedside US showed vertex presentation   FHT:  Baseline 120 , moderate variability, accelerations present, no decelerations Contractions:  Irregular     Labs: Results for orders placed or performed during the hospital encounter of 02/20/19 (from the past 24 hour(s))  CBC     Status: Abnormal   Collection Time: 02/20/19 11:29 PM  Result Value Ref Range   WBC 7.1 4.0 - 10.5 K/uL   RBC 3.99 3.87 - 5.11 MIL/uL   Hemoglobin 11.3 (L) 12.0 - 15.0 g/dL   HCT 34.3 (L) 36.0 - 46.0 %   MCV 86.0 80.0 - 100.0 fL   MCH 28.3 26.0 - 34.0 pg   MCHC 32.9 30.0 - 36.0 g/dL   RDW 13.5 11.5 - 15.5 %   Platelets 302 150 - 400 K/uL   nRBC 0.0 0.0 - 0.2 %  Comprehensive metabolic panel     Status: Abnormal   Collection Time: 02/20/19 11:29 PM  Result Value Ref Range   Sodium 136 135 - 145 mmol/L   Potassium 3.6 3.5 - 5.1 mmol/L   Chloride 104 98 - 111 mmol/L   CO2 22 22 - 32 mmol/L   Glucose, Bld 101 (H) 70 - 99 mg/dL   BUN <5 (L) 6 - 20 mg/dL   Creatinine, Ser 0.50 0.44 - 1.00 mg/dL   Calcium 9.1 8.9 - 10.3 mg/dL   Total Protein 6.5 6.5 - 8.1 g/dL   Albumin 3.0 (  L) 3.5 - 5.0 g/dL   AST 25 15 - 41 U/L   ALT 23 0 - 44 U/L   Alkaline Phosphatase 183 (H) 38 - 126 U/L   Total Bilirubin 0.4 0.3 - 1.2 mg/dL   GFR calc non Af Amer >60 >60 mL/min   GFR calc Af Amer >60 >60 mL/min   Anion gap 10 5 - 15  Fern Test     Status: Normal   Collection Time: 02/21/19 12:05 AM  Result Value Ref Range   POCT Fern Test Negative = intact amniotic membranes     --/--/AB POS, AB POS Performed at [facility] [address] [place], [place]  [address]  [phone]) Refused catheterization for Urine Protein/Creat Ratio  Imaging:  none  MAU Course/MDM: I have ordered labs and reviewed results. They are normal    She remained normotensive throughout.  NST reviewed, reactive with occasional contractions Consult Dr Ilda Basset with presentation, exam findings and test results.  He spoke with Neurologist who did not advise admission.  Discontinued Magnesium Sulfate infusion.  Recommended routine followup outpatient as scheduled.   Per Dr Ilda Basset, get [place] to check AFI and if normal discharge home  Reviewed discharge instructions with patient who is lucid.  She states she wants to be induced now because she is tired of being pregnant and that the pregnancy is causing her "epilepsy to get worse".  Advised to discuss with MD at visit on Monday. Advised to call Neurology in am to schedule followup    Assessment: Post epileptic seizure. No evidence for eclampsia Reactive fetal heart tracing Normal labs, normotensive  Plan: Discharge home Continue Keppra Labor precautions and fetal kick counts Follow up in Office for prenatal visits   Encouraged to return here or to other Urgent Care/ED if she develops worsening of symptoms, increase in pain, fever, or other concerning symptoms.  Pt stable at time of discharge.  Hansel Feinstein CNM, MSN Certified Nurse-Midwife 02/20/2019 11:53 PM

## 2019-02-20 NOTE — MAU Note (Addendum)
Patient reports to MAU via EMS following a grand mal seizure that lasted 2 minutes. pts significant others told the EMS crew she had also been having abdominal pain. EMS stated she had clear vaginal discharge. Pt is post seizure and EMS states she has been having focal seizures. Pt was given 4gram bolus of magnesium on the truck.   Initial BP: 125/80 Resp: 24 Temp: 98.3 axillary  Pain: pt nods to pain at 7/10 abdomen Sp02: 94 pt put on 10L o2 non rebreathe

## 2019-02-21 ENCOUNTER — Inpatient Hospital Stay (HOSPITAL_BASED_OUTPATIENT_CLINIC_OR_DEPARTMENT_OTHER): Payer: Medicaid Other

## 2019-02-21 DIAGNOSIS — G40909 Epilepsy, unspecified, not intractable, without status epilepticus: Secondary | ICD-10-CM | POA: Diagnosis not present

## 2019-02-21 DIAGNOSIS — Z3A38 38 weeks gestation of pregnancy: Secondary | ICD-10-CM

## 2019-02-21 DIAGNOSIS — O99213 Obesity complicating pregnancy, third trimester: Secondary | ICD-10-CM | POA: Diagnosis not present

## 2019-02-21 DIAGNOSIS — O99353 Diseases of the nervous system complicating pregnancy, third trimester: Secondary | ICD-10-CM

## 2019-02-21 LAB — COMPREHENSIVE METABOLIC PANEL
ALT: 23 U/L (ref 0–44)
AST: 25 U/L (ref 15–41)
Albumin: 3 g/dL — ABNORMAL LOW (ref 3.5–5.0)
Alkaline Phosphatase: 183 U/L — ABNORMAL HIGH (ref 38–126)
Anion gap: 10 (ref 5–15)
BUN: <5 mg/dL — ABNORMAL LOW (ref 6–20)
CO2: 22 mmol/L (ref 22–32)
Calcium: 9.1 mg/dL (ref 8.9–10.3)
Chloride: 104 mmol/L (ref 98–111)
Creatinine, Ser: 0.5 mg/dL (ref 0.44–1.00)
GFR calc Af Amer: >60 mL/min (ref >60)
GFR calc non Af Amer: >60 mL/min (ref >60)
Glucose, Bld: 101 mg/dL — ABNORMAL HIGH (ref 70–99)
Potassium: 3.6 mmol/L (ref 3.5–5.1)
Sodium: 136 mmol/L (ref 135–145)
Total Bilirubin: 0.4 mg/dL (ref 0.3–1.2)
Total Protein: 6.5 g/dL (ref 6.5–8.1)

## 2019-02-21 LAB — POCT FERN TEST: POCT Fern Test: NEGATIVE

## 2019-02-21 MED ORDER — LACTATED RINGERS IV SOLN: INTRAVENOUS | Status: DC

## 2019-02-21 NOTE — MAU Provider Note (Signed)
D/w Aroor of Neurology and he recommends nothing to do at the moment and follow up with [facility]. I already d/c'ed her Mg and limited ob u/s to assess placenta ordered. If all normal, then pt told to call [facility] tomorrow.   Durene Romans MD Attending Center for [facility] (Faculty Practice) 02/21/2019 Time: 1230am

## 2019-02-21 NOTE — MAU Note (Signed)
Dr. Ilda Basset came to patients bedside and stated to stop the Magnesium. Magnesium stopped at 0014.

## 2019-02-21 NOTE — Discharge Instructions (Signed)
Epilepsy Epilepsy is a condition in which a person has repeated seizures over time. A seizure is a sudden burst of abnormal electrical and chemical activity in the brain. Seizures can cause a change in attention, behavior, or the ability to remain awake and alert (altered mental status). Epilepsy increases a person's risk of falls, accidents, and injury. It can also lead to complications, including:  Depression.  Poor memory.  Sudden unexplained death in epilepsy (SUDEP). This complication is rare, and its cause is not known. Most people with epilepsy lead normal lives. What are the causes? This condition may be caused by:  A head injury.  An injury that happens at birth.  A high fever during childhood.  A stroke.  Bleeding that goes into or around the brain.  Certain medicines and drugs.  Having too little oxygen for a long period of time.  Abnormal brain development.  Certain infections, such as meningitis and encephalitis.  Brain tumors.  Conditions that are passed from parent to child (are hereditary). What are the signs or symptoms? Symptoms of a seizure vary greatly from person to person. They may include:  Convulsions.  Stiffening of the body.  Involuntary movements of the arms or legs.  Loss of consciousness.  Breathing problems.  Falling suddenly.  Confusion.  Head nodding.  Eye blinking or fluttering.  Lip smacking.  Drooling.  Rapid eye movements.  Grunting.  Loss of bladder control and bowel control.  Staring.  Unresponsiveness. Some people have symptoms right before a seizure happens (aura) and right after a seizure happens. Symptoms of an aura include:  Fear or anxiety.  Nausea.  Feeling like the room is spinning (vertigo).  A feeling of having seen or heard something before (dj vu).  Odd tastes or smells.  Changes in vision, such as seeing flashing lights or spots. Symptoms that follow a seizure  include:  Confusion.  Sleepiness.  Headache. How is this diagnosed? This condition is diagnosed based on:  Your symptoms.  Your medical history.  A physical exam.  A neurological exam. A neurological exam is similar to a physical exam. It involves checking your strength, reflexes, coordination, and sensations.  Tests, such as: ? A painless test that creates a diagram of your brain waves (electroencephalogram, orEEG). ? An MRI. ? A CT scan. ? A lumbar puncture, also called a spinal tap. ? Blood tests to check for signs of infection or abnormal blood chemistry. How is this treated? Treatment can control seizures. Some types of epilepsy will need lifelong treatment, and some types go away in time. Treatment for this condition may involve:  Taking medicines to control seizures.  Having a device called a vagus nerve stimulator implanted in the chest. The device sends electrical impulses to the vagus nerve and to the brain to prevent seizures. This treatment may be recommended if medicines do not help.  Brain surgery. There are several kinds of surgeries that may be done to stop seizures from happening or to reduce how often seizures happen.  Having regular blood tests. You may need to have blood tests regularly to check that you are getting the right amount of medicine. Once this condition has been diagnosed, it is important to begin treatment as soon as possible. For some people, epilepsy eventually goes away. Follow these instructions at home: Medicines  Take over-the-counter and prescription medicines only as told by your health care provider.  Avoid any substances that may prevent your medicine from working properly, such as alcohol. Activity  Get enough rest. Lack of sleep can make seizures more likely to occur.  Follow instructions from your health care provider about driving, swimming, and doing any other activities that would be dangerous if you had a seizure. ? If  you live in the [place] check with your local DMV (department of motor vehicles) to find out about local driving laws. Each state has specific rules about when you can legally return to driving. Educating others Teach friends and family what to do if you have a seizure. They should:  Lay you on the ground to prevent a fall.  Cushion your head and body.  Loosen any tight clothing around your neck.  Turn you on your side. If vomiting occurs, this helps keep your airway clear.  Stay with you until you recover.  Not hold you down. Holding you down will not stop the seizure.  Not put anything in your mouth.  Know whether or not you need emergency care.  General instructions  Avoid anything that has ever triggered a seizure for you.  Keep a seizure diary. Record what you remember about each seizure, especially anything that might have triggered the seizure.  Keep all follow-up visits as told by your health care provider. This is important. Contact a health care provider if:  Your seizure pattern changes.  You have symptoms of infection or another illness. This might increase your risk of having a seizure. Get help right away if:  You have: ? A seizure that does not stop after 5 minutes. ? Several seizures in a row without a complete recovery between seizures. ? A seizure that makes it harder to breathe. ? A seizure that is different from previous seizures. ? A seizure that leaves you unable to speak or use a part of your body.  You did not wake up immediately after a seizure. These symptoms may represent a serious problem that is an emergency. Do not wait to see if the symptoms will go away. Get medical help right away. Call your local emergency services (911 in the [place] Do not drive yourself to the hospital. Summary  Epilepsy is a condition in which a person has repeated seizures over time. Some types of epilepsy will need lifelong treatment, and some types go away in  time.  Seizures can cause many symptoms, from brief staring spells or involuntary movements of the arms or legs to convulsions with loss of consciousness.  Treatment is effective at controlling seizures. Take over-the-counter and prescription medicines only as told by your health care provider.  Follow instructions from your health care provider about driving, swimming, and doing any other activities that would be dangerous if you had a seizure.  Teach friends and family what to do if you have a seizure. This information is not intended to replace advice given to you by your health care provider. Make sure you discuss any questions you have with your health care provider. Document Revised: 08/20/2017 Document Reviewed: 08/20/2017 [employer] Patient Education  [facility] The third trimester is from week 28 through week 40 (months 7 through 9). The third trimester is a time when the unborn baby (fetus) is growing rapidly. At the end of the ninth month, the fetus is about 20 inches in length and weighs 6-10 pounds. Body changes during your third trimester Your body will continue to go through many changes during pregnancy. The changes vary from woman to woman. During the third trimester:  Your weight will  continue to increase. You can expect to gain 25-35 pounds (11-16 kg) by the end of the pregnancy.  You may begin to get stretch marks on your hips, abdomen, and breasts.  You may urinate more often because the fetus is moving lower into your pelvis and pressing on your bladder.  You may develop or continue to have heartburn. This is caused by increased hormones that slow down muscles in the digestive tract.  You may develop or continue to have constipation because increased hormones slow digestion and cause the muscles that push waste through your intestines to relax.  You may develop hemorrhoids. These are swollen veins (varicose veins) in the rectum  that can itch or be painful.  You may develop swollen, bulging veins (varicose veins) in your legs.  You may have increased body aches in the pelvis, back, or thighs. This is due to weight gain and increased hormones that are relaxing your joints.  You may have changes in your hair. These can include thickening of your hair, rapid growth, and changes in texture. Some women also have hair loss during or after pregnancy, or hair that feels dry or thin. Your hair will most likely return to normal after your baby is born.  Your breasts will continue to grow and they will continue to become tender. A yellow fluid (colostrum) may leak from your breasts. This is the first milk you are producing for your baby.  Your belly button may stick out.  You may notice more swelling in your hands, face, or ankles.  You may have increased tingling or numbness in your hands, arms, and legs. The skin on your belly may also feel numb.  You may feel short of breath because of your expanding uterus.  You may have more problems sleeping. This can be caused by the size of your belly, increased need to urinate, and an increase in your body's metabolism.  You may notice the fetus "dropping," or moving lower in your abdomen (lightening).  You may have increased vaginal discharge.  You may notice your joints feel loose and you may have pain around your pelvic bone. What to expect at prenatal visits You will have prenatal exams every 2 weeks until week 36. Then you will have weekly prenatal exams. During a routine prenatal visit:  You will be weighed to make sure you and the baby are growing normally.  Your blood pressure will be taken.  Your abdomen will be measured to track your baby's growth.  The fetal heartbeat will be listened to.  Any test results from the previous visit will be discussed.  You may have a cervical check near your due date to see if your cervix has softened or thinned (effaced).  You  will be tested for Group B streptococcus. This happens between 35 and 37 weeks. Your health care provider may ask you:  What your birth plan is.  How you are feeling.  If you are feeling the baby move.  If you have had any abnormal symptoms, such as leaking fluid, bleeding, severe headaches, or abdominal cramping.  If you are using any tobacco products, including cigarettes, chewing tobacco, and electronic cigarettes.  If you have any questions. Other tests or screenings that may be performed during your third trimester include:  Blood tests that check for low iron levels (anemia).  Fetal testing to check the health, activity level, and growth of the fetus. Testing is done if you have certain medical conditions or if there are  problems during the pregnancy.  Nonstress test (NST). This test checks the health of your baby to make sure there are no signs of problems, such as the baby not getting enough oxygen. During this test, a belt is placed around your belly. The baby is made to move, and its heart rate is monitored during movement. What is false labor? False labor is a condition in which you feel small, irregular tightenings of the muscles in the womb (contractions) that usually go away with rest, changing position, or drinking water. These are called Braxton Hicks contractions. Contractions may last for hours, days, or even weeks before true labor sets in. If contractions come at regular intervals, become more frequent, increase in intensity, or become painful, you should see your health care provider. What are the signs of labor?  Abdominal cramps.  Regular contractions that start at 10 minutes apart and become stronger and more frequent with time.  Contractions that start on the top of the uterus and spread down to the lower abdomen and back.  Increased pelvic pressure and dull back pain.  A watery or bloody mucus discharge that comes from the vagina.  Leaking of amniotic  fluid. This is also known as your "water breaking." It could be a slow trickle or a gush. Let your health care provider know if it has a color or strange odor. If you have any of these signs, call your health care provider right away, even if it is before your due date. Follow these instructions at home: Medicines  Follow your health care provider's instructions regarding medicine use. Specific medicines may be either safe or unsafe to take during pregnancy.  Take a prenatal vitamin that contains at least 600 micrograms (mcg) of folic acid.  If you develop constipation, try taking a stool softener if your health care provider approves. Eating and drinking   Eat a balanced diet that includes fresh fruits and vegetables, whole grains, good sources of protein such as meat, eggs, or tofu, and low-fat dairy. Your health care provider will help you determine the amount of weight gain that is right for you.  Avoid raw meat and uncooked cheese. These carry germs that can cause birth defects in the baby.  If you have low calcium intake from food, talk to your health care provider about whether you should take a daily calcium supplement.  Eat four or five small meals rather than three large meals a day.  Limit foods that are high in fat and processed sugars, such as fried and sweet foods.  To prevent constipation: ? Drink enough fluid to keep your urine clear or pale yellow. ? Eat foods that are high in fiber, such as fresh fruits and vegetables, whole grains, and beans. Activity  Exercise only as directed by your health care provider. Most women can continue their usual exercise routine during pregnancy. Try to exercise for 30 minutes at least 5 days a week. Stop exercising if you experience uterine contractions.  Avoid heavy lifting.  Do not exercise in extreme heat or humidity, or at high altitudes.  Wear low-heel, comfortable shoes.  Practice good posture.  You may continue to have  sex unless your health care provider tells you otherwise. Relieving pain and discomfort  Take frequent breaks and rest with your legs elevated if you have leg cramps or low back pain.  Take warm sitz baths to soothe any pain or discomfort caused by hemorrhoids. Use hemorrhoid cream if your health care provider approves.  Wear a good support bra to prevent discomfort from breast tenderness.  If you develop varicose veins: ? Wear support pantyhose or compression stockings as told by your healthcare provider. ? Elevate your feet for 15 minutes, 3-4 times a day. Prenatal care  Write down your questions. Take them to your prenatal visits.  Keep all your prenatal visits as told by your health care provider. This is important. Safety  Wear your seat belt at all times when driving.  Make a list of emergency phone numbers, including numbers for family, friends, the hospital, and police and fire departments. General instructions  Avoid cat litter boxes and soil used by cats. These carry germs that can cause birth defects in the baby. If you have a cat, ask someone to clean the litter box for you.  Do not travel far distances unless it is absolutely necessary and only with the approval of your health care provider.  Do not use hot tubs, steam rooms, or saunas.  Do not drink alcohol.  Do not use any products that contain nicotine or tobacco, such as cigarettes and e-cigarettes. If you need help quitting, ask your health care provider.  Do not use any medicinal herbs or unprescribed drugs. These chemicals affect the formation and growth of the baby.  Do not douche or use tampons or scented sanitary pads.  Do not cross your legs for long periods of time.  To prepare for the arrival of your baby: ? Take prenatal classes to understand, practice, and ask questions about labor and delivery. ? Make a trial run to the hospital. ? Visit the hospital and tour the maternity area. ? Arrange for  maternity or paternity leave through employers. ? Arrange for family and friends to take care of pets while you are in the hospital. ? Purchase a rear-facing car seat and make sure you know how to install it in your car. ? Pack your hospital bag. ? Prepare the baby's nursery. Make sure to remove all pillows and stuffed animals from the baby's crib to prevent suffocation.  Visit your dentist if you have not gone during your pregnancy. Use a soft toothbrush to brush your teeth and be gentle when you floss. Contact a health care provider if:  You are unsure if you are in labor or if your water has broken.  You become dizzy.  You have mild pelvic cramps, pelvic pressure, or nagging pain in your abdominal area.  You have lower back pain.  You have persistent nausea, vomiting, or diarrhea.  You have an unusual or bad smelling vaginal discharge.  You have pain when you urinate. Get help right away if:  Your water breaks before 37 weeks.  You have regular contractions less than 5 minutes apart before 37 weeks.  You have a fever.  You are leaking fluid from your vagina.  You have spotting or bleeding from your vagina.  You have severe abdominal pain or cramping.  You have rapid weight loss or weight gain.  You have shortness of breath with chest pain.  You notice sudden or extreme swelling of your face, hands, ankles, feet, or legs.  Your baby makes fewer than 10 movements in 2 hours.  You have severe headaches that do not go away when you take medicine.  You have vision changes. Summary  The third trimester is from week 28 through week 40, months 7 through 9. The third trimester is a time when the unborn baby (fetus) is growing rapidly.  During  the third trimester, your discomfort may increase as you and your baby continue to gain weight. You may have abdominal, leg, and back pain, sleeping problems, and an increased need to urinate.  During the third trimester your  breasts will keep growing and they will continue to become tender. A yellow fluid (colostrum) may leak from your breasts. This is the first milk you are producing for your baby.  False labor is a condition in which you feel small, irregular tightenings of the muscles in the womb (contractions) that eventually go away. These are called Braxton Hicks contractions. Contractions may last for hours, days, or even weeks before true labor sets in.  Signs of labor can include: abdominal cramps; regular contractions that start at 10 minutes apart and become stronger and more frequent with time; watery or bloody mucus discharge that comes from the vagina; increased pelvic pressure and dull back pain; and leaking of amniotic fluid. This information is not intended to replace advice given to you by your health care provider. Make sure you discuss any questions you have with your health care provider. Document Revised: 04/18/2018 Document Reviewed: 02/01/2016 Elsevier Patient Education  [place].

## 2019-02-22 ENCOUNTER — Other Ambulatory Visit: Payer: Self-pay

## 2019-02-22 ENCOUNTER — Ambulatory Visit (HOSPITAL_COMMUNITY)
Admission: EM | Admit: 2019-02-22 | Discharge: 2019-02-22 | Disposition: A | Discharge status: Home / Self Care | Payer: Medicaid Other | Attending: Urgent Care | Admitting: Urgent Care

## 2019-02-22 ENCOUNTER — Encounter (HOSPITAL_COMMUNITY): Payer: Self-pay

## 2019-02-22 DIAGNOSIS — Z3A38 38 weeks gestation of pregnancy: Secondary | ICD-10-CM

## 2019-02-22 DIAGNOSIS — K0510 Chronic gingivitis, plaque induced: Secondary | ICD-10-CM | POA: Diagnosis not present

## 2019-02-22 DIAGNOSIS — K0889 Other specified disorders of teeth and supporting structures: Secondary | ICD-10-CM

## 2019-02-22 MED ORDER — CHLORHEXIDINE GLUCONATE 0.12 % MT SOLN: OROMUCOSAL | 0 refills | Status: DC

## 2019-02-22 NOTE — ED Triage Notes (Signed)
Pt state she has gum pain. Pt states she has not been able to eat as much. Pt thinks she has a dental issue. Pt is 9 months pregnant.

## 2019-02-22 NOTE — ED Provider Notes (Signed)
[place]   MRN: [id number] DOB: 03/20/1995  Subjective:   Latoya Stark is a 24 y.o. female presenting for 2 week hx of left sided dental/gum pain. Now having pain of her right lower mouth/teeth as well. Has had worsening difficulty chewing. Her Ob recommended waiting out the sx as she is [redacted] weeks pregnant.  She has not been able to get in with her previous dentist.  No current facility-administered medications for this encounter.  Current Outpatient Medications:  .  acetaminophen (TYLENOL) 500 MG tablet, Take 1,000 mg by mouth every 6 (six) hours as needed for headache., Disp: , Rfl:  .  albuterol (PROVENTIL HFA;VENTOLIN HFA) 108 (90 Base) MCG/ACT inhaler, Inhale 2 puffs into the lungs every 6 (six) hours as needed for wheezing or shortness of breath., Disp: 1 Inhaler, Rfl: 0 .  aspirin EC 81 MG tablet, Take 1 tablet (81 mg total) by mouth daily. Take after 12 weeks for prevention of preeclampsia later in pregnancy, Disp: 300 tablet, Rfl: 2 .  Blood Pressure Monitoring (BLOOD PRESSURE CUFF) MISC, 1 Device by Does not apply route once a week., Disp: 1 each, Rfl: 0 .  cyclobenzaprine (FLEXERIL) 10 MG tablet, Take 1 tablet (10 mg total) by mouth 3 (three) times daily as needed for muscle spasms (back pain)., Disp: 30 tablet, Rfl: 0 .  docusate sodium (COLACE) 100 MG capsule, Take 1 capsule (100 mg total) by mouth 2 (two) times daily., Disp: 60 capsule, Rfl: 2 .  Doxylamine-Pyridoxine (DICLEGIS) 10-10 MG TBEC, Take 2 tablets by mouth at bedtime. If symptoms persist, add one tablet in the morning and one in the afternoon, Disp: 100 tablet, Rfl: 5 .  Elastic Bandages & Supports (COMFORT FIT MATERNITY SUPP LG) MISC, 1 application by Does not apply route daily., Disp: 1 each, Rfl: 0 .  folic acid (FOLVITE) 1 MG tablet, Take 1 tablet (1 mg total) by mouth daily., Disp: 30 tablet, Rfl: 10 .  levETIRAcetam (KEPPRA) 750 MG tablet, Take 2 tablets (1,500 mg total) by mouth 2 (two) times  daily., Disp: 120 tablet, Rfl: 2 .  metoCLOPramide (REGLAN) 5 MG tablet, Take 1 tablet (5 mg total) by mouth 3 (three) times daily., Disp: 90 tablet, Rfl: 1 .  ondansetron (ZOFRAN ODT) 4 MG disintegrating tablet, Take 1 tablet (4 mg total) by mouth every 8 (eight) hours as needed for nausea or vomiting., Disp: 20 tablet, Rfl: 0 .  pantoprazole (PROTONIX) 40 MG tablet, Take 1 tablet (40 mg total) by mouth daily., Disp: 30 tablet, Rfl: 1 .  polyethylene glycol (MIRALAX / GLYCOLAX) 17 g packet, Take 17 g by mouth 2 (two) times daily., Disp: 24 each, Rfl: 3 .  Prenat w/o A-FeCbn-Meth-FA-DHA (PRENATE MINI) 29-0.6-0.4-350 MG CAPS, Take 1 capsule by mouth daily before breakfast., Disp: 30 capsule, Rfl: 11 .  Promethazine HCl (PHENERGAN PO), Take 25 mg by mouth every 6 (six) hours as needed (nausea). , Disp: , Rfl:  .  senna (SENOKOT) 8.6 MG TABS tablet, Take 1 tablet (8.6 mg total) by mouth daily., Disp: 120 tablet, Rfl: 0    Allergies  Allergen Reactions  . Milk-Related Compounds Dermatitis    Breaks face out *can have 2% milk*  . Mushroom Extract Complex Anaphylaxis  . Penicillins Anaphylaxis    Has patient had a PCN reaction causing immediate rash, facial/tongue/throat swelling, SOB or lightheadedness with hypotension: Yes Has patient had a PCN reaction causing severe rash involving mucus membranes or skin necrosis: No Has patient had  a PCN reaction that required hospitalization No Has patient had a PCN reaction occurring within the last 10 years: Yes If all of the above answers are "NO", then may proceed with Cephalosporin use.   [name] Other (See Comments)    Breaks face out  . Latex Itching and Swelling  . Sulfa Antibiotics Swelling  . Vicodin [Hydrocodone-Acetaminophen] Hives    Past Medical History:  Diagnosis Date  . ADHD (attention deficit hyperactivity disorder)   . Anemia   . Anxiety   . Asthma   . Chronic migraine 08/01/2018  . Depression   . Gonorrhea in female 10/05/2013   . 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[place]) 08/01/2018  . Headache   . Obesity   . Seizures ([place])    "when I get too hot"  . Trichomonas infection   . UTI (lower urinary tract infection)      Past Surgical History:  Procedure Laterality Date  . ADENOIDECTOMY    . DILATION AND EVACUATION N/A 08/09/2013   Procedure: DILATATION AND EVACUATION;  Surgeon: Frederico Hamman, MD;  Location: [facility];  Service: Gynecology;  Laterality: N/A;  . ORTHOPEDIC SURGERY Left    left hand  . TONSILLECTOMY      Family History  Problem Relation Age of Onset  . Seizures Mother   . Stroke Mother   . Hypertension Father   . Diabetes Maternal Grandmother   . Colon cancer Maternal Grandmother   . Hypertension Maternal Grandmother   . Clotting disorder Maternal Grandmother   . Breast cancer Maternal Grandmother   . Stroke Maternal Grandmother   . Diabetes Paternal Grandmother   . Seizures Maternal Aunt     Social History   Tobacco Use  . Smoking status: Never Smoker  . Smokeless tobacco: Never Used  Substance Use Topics  . Alcohol use: No    Alcohol/week: 0.0 standard drinks  . Drug use: No    ROS   Objective:   Vitals: BP 125/80 (BP Location: Right Arm)   Pulse 100   Temp 98.2 F (36.8 C)   Resp 18   Wt (!) 321 lb (145.6 kg)   LMP 05/31/2018 (Exact Date)   SpO2 100%   BMI 55.10 kg/m   Physical Exam Constitutional:      General: She is not in acute distress.    Appearance: Normal appearance. She is well-developed. She is not ill-appearing, toxic-appearing or diaphoretic.  HENT:     Head: Normocephalic and atraumatic.     Nose: Nose normal.     Mouth/Throat:     Mouth: Mucous membranes are moist.     Pharynx: Oropharynx is clear.   Eyes:     General: No scleral icterus.    Extraocular Movements: Extraocular movements intact.     Pupils: Pupils are equal, round, and reactive to light.  Cardiovascular:     Rate and Rhythm: Normal rate.  Pulmonary:     Effort: Pulmonary effort  is normal.  Skin:    General: Skin is warm and dry.  Neurological:     General: No focal deficit present.     Mental Status: She is alert and oriented to person, place, and time.  Psychiatric:        Mood and Affect: Mood normal.        Behavior: Behavior normal.     Assessment and Plan :   1. Gingivitis   2. Tooth pain with chewing   3. [redacted] weeks gestation of pregnancy  Patient is very near pregnancy and Augmentin is not safe at this time.  Not entirely sure what her obstetrician recommended but emphasized need for follow-up if her difficulty with her oral pain persists.  In the meantime, will use chlorhexidine rinse.  Emphasized patient needs to avoid swallowing and spit the medication.  Contact dentist and set up an appointment as well for consult. Counseled patient on potential for adverse effects with medications prescribed/recommended today, ER and return-to-clinic precautions discussed, patient verbalized understanding.    Jaynee Eagles, [place] 02/22/19 1853

## 2019-02-22 NOTE — Discharge Instructions (Signed)
Make sure you do not swallow the chlorhexidine rinse. Using the rinse is not absorbed well in the GI tract and therefore much less likely to harm your pregnancy.

## 2019-02-24 ENCOUNTER — Telehealth: Payer: Self-pay | Admitting: Family Medicine

## 2019-02-24 NOTE — Telephone Encounter (Signed)
Pt went to the hospital Saturday and was told to call Amy, NP to report that she had a seizure

## 2019-02-25 NOTE — Telephone Encounter (Signed)
Please let her know that this could have been in the setting of gingivitis and dehydration. Please make sure she is well hydrated. Remind her not to miss any doses of levetiracetam. She is so close to delivery, I hesitate to increase medication at this time. Have her continue to montior symptoms closely and follow up with [place] following delivery.

## 2019-02-25 NOTE — Telephone Encounter (Signed)
I called pt and LMVM for her of AL/NP message.  Keep hydrated. Sz could have been from deyhyration and gingivitis.  Continue to take levetiracetam daily as ordered.  Since so close to delivery, hesitate to change (increase dose).  Monitor and let us know how you are after delivery. Pt to call us back if questions/concerns.

## 2019-02-25 NOTE — Telephone Encounter (Signed)
I called pt/ fiance and they stated that she had seizure (one) lasted for about a minute, jerking.  Went to [facility] by EMS.  Given medication started with M?  She was having stomach pains, which worsened on there way to hospital when had seizure.  She has been complaint with keprra 1500mg  po bid.  Has gingivitis (being treated with mouthwash only).  Due 03/05/19 pregnancy.

## 2019-02-26 ENCOUNTER — Ambulatory Visit: Payer: Medicaid Other | Admitting: Physical Therapy

## 2019-02-26 ENCOUNTER — Encounter: Payer: Medicaid Other | Admitting: Obstetrics & Gynecology

## 2019-02-26 ENCOUNTER — Other Ambulatory Visit: Payer: Self-pay | Admitting: Advanced Practice Midwife

## 2019-02-26 ENCOUNTER — Telehealth (INDEPENDENT_AMBULATORY_CARE_PROVIDER_SITE_OTHER): Payer: Medicaid Other | Admitting: Obstetrics & Gynecology

## 2019-02-26 VITALS — BP 121/87 | HR 111

## 2019-02-26 DIAGNOSIS — O99213 Obesity complicating pregnancy, third trimester: Secondary | ICD-10-CM

## 2019-02-26 DIAGNOSIS — G40909 Epilepsy, unspecified, not intractable, without status epilepticus: Secondary | ICD-10-CM

## 2019-02-26 DIAGNOSIS — O0990 Supervision of high risk pregnancy, unspecified, unspecified trimester: Secondary | ICD-10-CM

## 2019-02-26 DIAGNOSIS — Z3A38 38 weeks gestation of pregnancy: Secondary | ICD-10-CM

## 2019-02-26 DIAGNOSIS — O99353 Diseases of the nervous system complicating pregnancy, third trimester: Secondary | ICD-10-CM

## 2019-02-26 DIAGNOSIS — Z6841 Body Mass Index (BMI) 40.0 and over, adult: Secondary | ICD-10-CM

## 2019-02-26 NOTE — Progress Notes (Signed)
I connected with  Latoya Stark on 02/26/19 by a video enabled telemedicine application and verified that I am speaking with the correct person using two identifiers.   I discussed the limitations of evaluation and management by telemedicine. The patient expressed understanding and agreed to proceed.  MyChart. Patient wants to be induced early.

## 2019-02-26 NOTE — Progress Notes (Signed)
   TELEHEALTH VIRTUAL OBSTETRICS VISIT ENCOUNTER NOTE  I connected with [name] on 02/26/19 at  3:45 PM EST by telephone at home and verified that I am speaking with the correct person using two identifiers.   I discussed the limitations, risks, security and privacy concerns of performing an evaluation and management service by telephone and the availability of in person appointments. I also discussed with the patient that there may be a patient responsible charge related to this service. The patient expressed understanding and agreed to proceed.  Subjective:  Latoya Stark is a 24 y.o. G5P0030 at [redacted]w[redacted]d being followed for ongoing prenatal care.  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  Patient reports FREQUENT SEIZURES. Reports fetal movement. Denies any contractions, bleeding or leaking of fluid.   The following portions of the patient's history were reviewed and updated as appropriate: allergies, current medications, past family history, past medical history, past social history, past surgical history and problem list.   Objective:   General:  Alert, oriented and cooperative.   Mental Status: Normal mood and affect perceived. Normal judgment and thought content.  Rest of physical exam deferred due to type of encounter  Assessment and Plan:  Pregnancy: G5P0030 at [redacted]w[redacted]d 1. Supervision of high risk pregnancy, antepartum Offered 39 week IOL uncontrolled sz d/o  2. Seizure disorder during pregnancy in third trimester ([place]) Poor control  3. BMI 50.0-59.9, adult  [facility])   Term labor symptoms and general obstetric precautions including but not limited to vaginal bleeding, contractions, leaking of fluid and fetal movement were reviewed in detail with the patient.  I discussed the assessment and treatment plan with the patient. The patient was provided an opportunity to ask questions and all were answered. The patient agreed with the plan and demonstrated an understanding of the instructions. The patient was advised to call back or seek an in-person office evaluation/go to MAU at [facility] for any urgent or concerning symptoms. Please refer to After Visit Summary for other counseling recommendations.   I provided 12 minutes of non-face-to-face time during this encounter.  No follow-ups on file.  Future Appointments  Date Time Provider [place]  02/28/2019 11:30 AM Aliene Altes [facility]  03/03/2019 10:00 AM Aliene Altes [facility]  03/24/2019 10:30 AM Kerin Perna, NP [facility] None  05/19/2019 11:30 AM Lomax, Amy, NP [facility] None    Emeterio Reeve, MD [facility], [place]

## 2019-02-26 NOTE — Patient Instructions (Signed)

## 2019-02-26 NOTE — Addendum Note (Signed)
Addended by: Woodroe Mode on: 02/26/2019 05:12 PM   Modules accepted: Orders, SmartSet

## 2019-02-27 ENCOUNTER — Telehealth (HOSPITAL_COMMUNITY): Payer: Self-pay | Admitting: *Deleted

## 2019-02-27 NOTE — Telephone Encounter (Signed)
Preadmission screen  

## 2019-02-28 ENCOUNTER — Ambulatory Visit: Payer: Medicaid Other | Admitting: Physical Therapy

## 2019-02-28 ENCOUNTER — Other Ambulatory Visit: Payer: Self-pay

## 2019-02-28 ENCOUNTER — Inpatient Hospital Stay (HOSPITAL_COMMUNITY): Payer: Medicaid Other

## 2019-02-28 ENCOUNTER — Encounter (HOSPITAL_COMMUNITY): Payer: Self-pay | Admitting: Obstetrics & Gynecology

## 2019-02-28 ENCOUNTER — Inpatient Hospital Stay (HOSPITAL_COMMUNITY)
Admission: AD | Admit: 2019-02-28 | Discharge: 2019-03-03 | DRG: 807 | Disposition: A | Discharge status: Home / Self Care | Payer: Medicaid Other | Attending: Family Medicine | Admitting: Family Medicine

## 2019-02-28 DIAGNOSIS — G40909 Epilepsy, unspecified, not intractable, without status epilepticus: Secondary | ICD-10-CM | POA: Diagnosis present

## 2019-02-28 DIAGNOSIS — G8384 Todd's paralysis (postepileptic): Secondary | ICD-10-CM | POA: Diagnosis present

## 2019-02-28 DIAGNOSIS — O99210 Obesity complicating pregnancy, unspecified trimester: Secondary | ICD-10-CM | POA: Diagnosis present

## 2019-02-28 DIAGNOSIS — E669 Obesity, unspecified: Secondary | ICD-10-CM | POA: Diagnosis present

## 2019-02-28 DIAGNOSIS — Z20822 Contact with and (suspected) exposure to covid-19: Secondary | ICD-10-CM | POA: Diagnosis present

## 2019-02-28 DIAGNOSIS — G40409 Other generalized epilepsy and epileptic syndromes, not intractable, without status epilepticus: Secondary | ICD-10-CM | POA: Diagnosis present

## 2019-02-28 DIAGNOSIS — D649 Anemia, unspecified: Secondary | ICD-10-CM | POA: Diagnosis present

## 2019-02-28 DIAGNOSIS — O9952 Diseases of the respiratory system complicating childbirth: Secondary | ICD-10-CM | POA: Diagnosis present

## 2019-02-28 DIAGNOSIS — O99354 Diseases of the nervous system complicating childbirth: Secondary | ICD-10-CM | POA: Diagnosis present

## 2019-02-28 DIAGNOSIS — O9902 Anemia complicating childbirth: Secondary | ICD-10-CM | POA: Diagnosis present

## 2019-02-28 DIAGNOSIS — Z880 Allergy status to penicillin: Secondary | ICD-10-CM

## 2019-02-28 DIAGNOSIS — O0990 Supervision of high risk pregnancy, unspecified, unspecified trimester: Secondary | ICD-10-CM

## 2019-02-28 DIAGNOSIS — Z3689 Encounter for other specified antenatal screening: Secondary | ICD-10-CM | POA: Diagnosis not present

## 2019-02-28 DIAGNOSIS — O99214 Obesity complicating childbirth: Secondary | ICD-10-CM | POA: Diagnosis present

## 2019-02-28 DIAGNOSIS — O99353 Diseases of the nervous system complicating pregnancy, third trimester: Secondary | ICD-10-CM

## 2019-02-28 DIAGNOSIS — J45909 Unspecified asthma, uncomplicated: Secondary | ICD-10-CM | POA: Diagnosis present

## 2019-02-28 DIAGNOSIS — Z6841 Body Mass Index (BMI) 40.0 and over, adult: Secondary | ICD-10-CM

## 2019-02-28 DIAGNOSIS — Z3A39 39 weeks gestation of pregnancy: Secondary | ICD-10-CM | POA: Diagnosis not present

## 2019-02-28 LAB — TYPE AND SCREEN
ABO/RH(D): AB POS
Antibody Screen: NEGATIVE

## 2019-02-28 LAB — CBC
HCT: 35.6 % — ABNORMAL LOW (ref 36.0–46.0)
Hemoglobin: 11.4 g/dL — ABNORMAL LOW (ref 12.0–15.0)
MCH: 27.8 pg (ref 26.0–34.0)
MCHC: 32 g/dL (ref 30.0–36.0)
MCV: 86.8 fL (ref 80.0–100.0)
Platelets: 308 10*3/uL (ref 150–400)
RBC: 4.1 MIL/uL (ref 3.87–5.11)
RDW: 13.8 % (ref 11.5–15.5)
WBC: 6.5 10*3/uL (ref 4.0–10.5)
nRBC: 0 % (ref 0.0–0.2)

## 2019-02-28 LAB — RPR: RPR Ser Ql: NONREACTIVE

## 2019-02-28 LAB — SARS CORONAVIRUS 2 (TAT 6-24 HRS): SARS Coronavirus 2: NEGATIVE

## 2019-02-28 MED ORDER — LACTATED RINGERS IV SOLN
500.0000 mL | INTRAVENOUS | Status: DC | PRN
  Administered 2019-03-01: 08:00:00 1000 mL via INTRAVENOUS

## 2019-02-28 MED ORDER — FENTANYL-BUPIVACAINE-NACL 0.5-0.125-0.9 MG/250ML-% EP SOLN
12.0000 mL/h | EPIDURAL | Status: DC | PRN
  Filled 2019-02-28: qty 250

## 2019-02-28 MED ORDER — LACTATED RINGERS IV SOLN: INTRAVENOUS | Status: DC

## 2019-02-28 MED ORDER — DIPHENHYDRAMINE HCL 50 MG/ML IJ SOLN
12.5000 mg | INTRAMUSCULAR | Status: DC | PRN
  Administered 2019-03-01: 08:00:00 12.5 mg via INTRAVENOUS
  Filled 2019-02-28: qty 1

## 2019-02-28 MED ORDER — LEVETIRACETAM 750 MG PO TABS
1500.0000 mg | ORAL_TABLET | Freq: Two times a day (BID) | ORAL | Status: DC
  Administered 2019-02-28 – 2019-03-03 (×6): 1500 mg via ORAL
  Filled 2019-02-28 (×13): qty 2

## 2019-02-28 MED ORDER — PHENYLEPHRINE 40 MCG/ML (10ML) SYRINGE FOR IV PUSH (FOR BLOOD PRESSURE SUPPORT)
80.0000 ug | PREFILLED_SYRINGE | INTRAVENOUS | Status: DC | PRN
  Filled 2019-02-28: qty 10

## 2019-02-28 MED ORDER — MISOPROSTOL 50MCG HALF TABLET
50.0000 ug | ORAL_TABLET | ORAL | Status: DC
  Administered 2019-02-28: 09:00:00 50 ug via BUCCAL

## 2019-02-28 MED ORDER — LACTATED RINGERS IV SOLN
500.0000 mL | Freq: Once | INTRAVENOUS | Status: AC
  Administered 2019-03-01: 08:00:00 500 mL via INTRAVENOUS

## 2019-02-28 MED ORDER — TERBUTALINE SULFATE 1 MG/ML IJ SOLN
0.2500 mg | Freq: Once | INTRAMUSCULAR | Status: AC | PRN
  Administered 2019-03-01: 08:00:00 0.25 mg via SUBCUTANEOUS
  Filled 2019-02-28: qty 1

## 2019-02-28 MED ORDER — SOD CITRATE-CITRIC ACID 500-334 MG/5ML PO SOLN
30.0000 mL | ORAL | Status: DC | PRN
  Filled 2019-02-28: qty 30

## 2019-02-28 MED ORDER — LIDOCAINE HCL (PF) 1 % IJ SOLN
30.0000 mL | INTRAMUSCULAR | Status: AC | PRN
  Administered 2019-03-01: 30 mL via SUBCUTANEOUS
  Filled 2019-02-28: qty 30

## 2019-02-28 MED ORDER — OXYTOCIN 40 UNITS IN NORMAL SALINE INFUSION - SIMPLE MED
2.5000 [IU]/h | INTRAVENOUS | Status: DC
  Filled 2019-02-28: qty 1000

## 2019-02-28 MED ORDER — PHENYLEPHRINE 40 MCG/ML (10ML) SYRINGE FOR IV PUSH (FOR BLOOD PRESSURE SUPPORT): 80.0000 ug | PREFILLED_SYRINGE | INTRAVENOUS | Status: DC | PRN

## 2019-02-28 MED ORDER — MISOPROSTOL 50MCG HALF TABLET
ORAL_TABLET | ORAL | Status: AC
  Filled 2019-02-28: qty 1

## 2019-02-28 MED ORDER — EPHEDRINE 5 MG/ML INJ: 10.0000 mg | INTRAVENOUS | Status: DC | PRN

## 2019-02-28 MED ORDER — OXYTOCIN 40 UNITS IN NORMAL SALINE INFUSION - SIMPLE MED
1.0000 m[IU]/min | INTRAVENOUS | Status: DC
  Administered 2019-02-28: 17:00:00 2 m[IU]/min via INTRAVENOUS

## 2019-02-28 MED ORDER — OXYTOCIN BOLUS FROM INFUSION
500.0000 mL | Freq: Once | INTRAVENOUS | Status: AC
  Administered 2019-03-01: 12:00:00 500 mL via INTRAVENOUS

## 2019-02-28 MED ORDER — ZOLPIDEM TARTRATE 5 MG PO TABS
5.0000 mg | ORAL_TABLET | Freq: Once | ORAL | Status: AC
  Administered 2019-02-28: 5 mg via ORAL
  Filled 2019-02-28: qty 1

## 2019-02-28 MED ORDER — FENTANYL CITRATE (PF) 100 MCG/2ML IJ SOLN
INTRAMUSCULAR | Status: AC
  Filled 2019-02-28: qty 2

## 2019-02-28 MED ORDER — FENTANYL CITRATE (PF) 100 MCG/2ML IJ SOLN
100.0000 ug | INTRAMUSCULAR | Status: DC | PRN
  Administered 2019-02-28 (×5): 100 ug via INTRAVENOUS
  Filled 2019-02-28 (×4): qty 2

## 2019-02-28 MED ORDER — ONDANSETRON HCL 4 MG/2ML IJ SOLN
4.0000 mg | Freq: Four times a day (QID) | INTRAMUSCULAR | Status: DC | PRN
  Administered 2019-02-28: 14:00:00 4 mg via INTRAVENOUS
  Filled 2019-02-28: qty 2

## 2019-02-28 NOTE — H&P (Signed)
Latoya Stark is a 24 y.o. female presenting for [redacted]w[redacted]d week induction 2/2 seizure disorder that is poorly controlled. She is on Keppra 1500mg  BID. She took her morning dose. She denies any other complications with this pregnancy.  OB History    Gravida  5   Para      Term      Preterm      AB  3   Living        SAB  3   TAB      Ectopic      Multiple      Live Births             Past Medical History:  Diagnosis Date  . ADHD (attention deficit hyperactivity disorder)   . Anemia   . Anxiety   . Asthma   . Chronic migraine 08/01/2018  . Depression   . Gonorrhea in female 10/05/2013  . 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[place]) 08/01/2018  . Headache   . Obesity   . Seizures ([place])    "when I get too hot"  . Trichomonas infection   . UTI (lower urinary tract infection)    Past Surgical History:  Procedure Laterality Date  . ADENOIDECTOMY    . DILATION AND EVACUATION N/A 08/09/2013   Procedure: DILATATION AND EVACUATION;  Surgeon: Frederico Hamman, MD;  Location: [facility];  Service: Gynecology;  Laterality: N/A;  . ORTHOPEDIC SURGERY Left    left hand  . TONSILLECTOMY     Family History: family history includes Breast cancer in her maternal grandmother; Clotting disorder in her maternal grandmother; Colon cancer in her maternal grandmother; Diabetes in her maternal grandmother and paternal grandmother; Hypertension in her father and maternal grandmother; Seizures in her maternal aunt and mother; Stroke in her maternal grandmother and mother. Social History:  reports that she has never smoked. She has never used smokeless tobacco. She reports that she does not drink alcohol or use drugs.     Maternal Diabetes: No Genetic Screening: Normal Maternal Ultrasounds/Referrals: Normal Fetal Ultrasounds or other Referrals:  None Maternal Substance Abuse:  No Significant Maternal Medications:  Meds include: Other: Keppra 1500mg  BID  Significant Maternal Lab Results:  Group B  Strep negative Other Comments:  None   Clinic [place] Prenatal Labs  Dating LMP Blood type: --/--/AB POS, AB POS Performed at [facility], [place] [address], [place], [place] [address]  [phone])   Genetic Screen AFP:  Screen negative   NIPS: low risk, female  Antibody:NEG (11/14 1541)  Anatomic [place]  Rubella: 1.49 (07/21 1150)  GTT Early:               Third trimester:   Ref. Range 12/11/2018 09:42  Glucose, 1 hour Latest Ref Range: 65 - 179 mg/dL 155  Glucose, Fasting Latest Ref Range: 65 - 91 mg/dL 82  Glucose, 2 hour Latest Ref Range: 65 - 152 mg/dL 124   RPR: Non Reactive (12/02 0943)   Flu vaccine 03/19/2017 HBsAg: Negative (07/21 1150)   TDaP vaccine                                           Rhogam: NA HIV: Non Reactive (12/02 0943)   Baby Food  Bottle unsure ?  GBS: Negative   Contraception Nexplanon  Pap:11/02/2016 WNL   Circumcision Yes if a boy    Pediatrician Undecided  Dr.Henderson  [id number] horizon   Support Person FOB Jermaine  SMA: negative horizon   Prenatal Classes Interested  Hgb electrophoresis: negative [facility]    Review of Systems  All other systems reviewed and are negative.  History Dilation: 1 Effacement (%): 50 Station: -2 Exam by:: Marcille Buffy, CNM Height 5\' 4"  (1.626 m), weight (!) 145.7 kg, last menstrual period 05/31/2018, unknown if currently breastfeeding. Maternal Exam:  Uterine Assessment: Contraction strength is mild.  Contraction frequency is rare.   Abdomen: Patient reports no abdominal tenderness. Fetal presentation: vertex  Introitus: Normal vulva. Normal vagina.  Pelvis: adequate for delivery.   Cervix: Cervix evaluated by digital exam.     Fetal Exam Fetal Monitor Review: Mode: ultrasound.   Baseline rate: 120.  Variability: moderate (6-25 bpm).   Pattern: accelerations present and no decelerations.    Fetal State Assessment: Category I - tracings are normal.     Physical  Exam  Nursing note and vitals reviewed. Constitutional: She is oriented to person, place, and time. She appears well-developed and well-nourished. No distress.  HENT:  Head: Normocephalic.  Cardiovascular: Normal rate.  Respiratory: Effort normal.  GI: Soft. There is no abdominal tenderness. There is no rebound.  Genitourinary:    Vulva and vagina normal.   Neurological: She is alert and oriented to person, place, and time.  Skin: Skin is warm and dry.  Psychiatric: She has a normal mood and affect.    Prenatal labs: ABO, Rh: --/--/AB POS, AB POS Performed at [facility], [place] [address], [place], [place] [address]  [phone]) Antibody: NEG (11/14 1541) Rubella: 1.49 (07/21 1150) RPR: Non Reactive (12/02 0943)  HBsAg: Negative (07/21 1150)  HIV: Non Reactive (12/02 0943)  GBS:   Negative   Assessment/Plan: 24 y.o. G5P0030 at [redacted]w[redacted]d  IOL 2/2 poorly controlled seizure disorder  Admit to labor and delivery  Reassuring maternal-fetal status  Will need cervical ripening. Will start with cytotec and attempt to place balloon in a few hours.   Marcille Buffy DNP, CNM  02/28/19  9:18 AM

## 2019-02-28 NOTE — Progress Notes (Signed)
Latoya Stark is a 24 y.o. G5P0030 at [redacted]w[redacted]d   Subjective: Feeling ok. Pitocin is going now. She reports that she feeling a lot more of the contractions now. No questions at this time.   Objective: BP 116/72   Pulse 98   Temp 97.9 F (36.6 C) (Oral)   Resp 18   Ht 5\' 4"  (1.626 m)   Wt (!) 145.7 kg   LMP 05/31/2018 (Exact Date)   BMI 55.15 kg/m  No intake/output data recorded. No intake/output data recorded.  FHT:  FHR: 120 bpm, variability: moderate,  accelerations:  Present,  decelerations:  Absent UC:   irregular, every 3-6 minutes SVE:   Dilation: 4.5 Effacement (%): 60 Station: -3 Exam by:: Epifanio Lesches, RNC  Labs: Lab Results  Component Value Date   WBC 6.5 02/28/2019   HGB 11.4 (L) 02/28/2019   HCT 35.6 (L) 02/28/2019   MCV 86.8 02/28/2019   PLT 308 02/28/2019    Assessment / Plan: IOL. Cervical ripening complete, pitocin started   Labor: Progressing normally Preeclampsia:  no signs or symptoms of toxicity Fetal Wellbeing:  Category I Pain Control:  Labor support without medications I/D:  n/a Anticipated MOD:  NSVD   Marcille Buffy DNP, CNM  02/28/19  7:31 PM

## 2019-02-28 NOTE — Progress Notes (Signed)
Latoya Stark is a 24 y.o. G5P0030 at [redacted]w[redacted]d admitted for IOL 2/2 poorly controlled seizure disorder  Subjective: Contractions feel like bad period cramps. She is tired and would like something for sleep.  Objective: BP (!) 110/46   Pulse 97   Temp 98 F (36.7 C) (Oral)   Resp 18   Ht 5\' 4"  (1.626 m)   Wt (!) 145.7 kg   LMP 05/31/2018 (Exact Date)   BMI 55.15 kg/m  No intake/output data recorded.  FHT:  FHR: 120 bpm, variability: moderate,  accelerations:  Present,  decelerations:  Absent UC:   regular, every 2-3 minutes  SVE:   Dilation: 5 Effacement (%): 50 Station: -3 Exam by:: Janetta Hora, RN  Pitocin @ 8 mu/min  Labs: Lab Results  Component Value Date   WBC 6.5 02/28/2019   HGB 11.4 (L) 02/28/2019   HCT 35.6 (L) 02/28/2019   MCV 86.8 02/28/2019   PLT 308 02/28/2019    Assessment / Plan: 24 yo G5P0040 at 39.0 EGA here for IOL 2/2 poorly controlled seizure disorder.  Labor: S/p FB and cytotec. Pitocin at 8, continue to titrate up. Consider AROM when appropriate. Fetal Wellbeing:  Category I Pain Control:  per patient request Seizure disorder: No signs or symptoms, continue home keppra I/D:  GBS negative Anticipated MOD:  vaginal  Latoya Simonich L Nannie Starzyk DO OB Fellow, Faculty Practice 02/28/2019, 11:41 PM

## 2019-02-28 NOTE — Progress Notes (Addendum)
Latoya Stark is a 24 y.o. [id number] at [redacted]w[redacted]d admitted for induction of labor due to poorly controlled seizure disorder.  Subjective:  Patient coping well. She states that she is having a lot of cramping from cytotec. Reviewed FB procedure patient would like to proceed with FB placement.   Reviewed seizure hx patient reports that she has several seizures/week. She last had one 02/22/2019. She states that she does not take anything to stop the seizure and then generally stop on their own. Her partner states that he usually puts a cool cloth on her head to help stop the seizures.    Objective: BP 131/85   Pulse 88   Temp 98.7 F (37.1 C) (Oral)   Resp 20   Ht 5\' 4"  (1.626 m)   Wt (!) 145.7 kg   LMP 05/31/2018 (Exact Date)   BMI 55.15 kg/m  No intake/output data recorded. No intake/output data recorded.  FHT:  FHR: 115 bpm, variability: moderate,  accelerations:  Present,  decelerations:  Absent UC:   irregular, every 6 minutes SVE:   Dilation: 1 Effacement (%): 50 Station: -2 Exam by:: Marcille Buffy, CNM    Procedure: Patient informed of R/B/A of procedure. NST was performed and was reactive prior to procedure. Procedure done to begin ripening of the cervix prior to admission for induction of labor. Appropriate time out taken. The patient was placed in the lithotomy position and the cervix brought into view with sterile speculum. A ring forcep was used to guide the 65F foley balloon through the internal os of the cervix. Foley Balloon filled with 60cc of normal saline. Plug inserted into end of the foley. Foley placed on tension and taped to medial thigh.  NST:  EFM Baseline: 115 bpm, Variability: Good {> 6 bpm), Accelerations: Reactive and Decelerations: Absent  Toco: irregular, every 6 minutes There were no signs of tachysystole or hypertonus. All equipment was removed and accounted for. The patient tolerated the procedure well.    Labs: Lab Results  Component Value Date   WBC 6.5 02/28/2019   HGB 11.4 (L) 02/28/2019   HCT 35.6 (L) 02/28/2019   MCV 86.8 02/28/2019   PLT 308 02/28/2019    Assessment / Plan: IOL still in cervical ripening phase. Cytotec x 1 and now FB placed  Labor: Progressing normally Preeclampsia:  no signs or symptoms of toxicity Fetal Wellbeing:  Category I Pain Control:  Labor support without medications I/D:  n/a Anticipated MOD:  NSVD  Marcille Buffy DNP, CNM  02/28/19  1:21 PM

## 2019-03-01 ENCOUNTER — Inpatient Hospital Stay (HOSPITAL_COMMUNITY): Payer: Medicaid Other | Admitting: Anesthesiology

## 2019-03-01 DIAGNOSIS — G40409 Other generalized epilepsy and epileptic syndromes, not intractable, without status epilepticus: Secondary | ICD-10-CM

## 2019-03-01 DIAGNOSIS — Z3A39 39 weeks gestation of pregnancy: Secondary | ICD-10-CM

## 2019-03-01 DIAGNOSIS — O99354 Diseases of the nervous system complicating childbirth: Secondary | ICD-10-CM

## 2019-03-01 MED ORDER — BENZOCAINE-MENTHOL 20-0.5 % EX AERO
1.0000 "application " | INHALATION_SPRAY | CUTANEOUS | Status: DC | PRN
  Administered 2019-03-01 – 2019-03-03 (×2): 1 via TOPICAL
  Filled 2019-03-01 (×2): qty 56

## 2019-03-01 MED ORDER — SODIUM BICARBONATE 8.4 % IV SOLN
INTRAVENOUS | Status: DC | PRN
  Administered 2019-03-01: 8 mL via EPIDURAL

## 2019-03-01 MED ORDER — COCONUT OIL OIL: 1.0000 "application " | TOPICAL_OIL | Status: DC | PRN

## 2019-03-01 MED ORDER — TETANUS-DIPHTH-ACELL PERTUSSIS 5-2.5-18.5 LF-MCG/0.5 IM SUSP: 0.5000 mL | Freq: Once | INTRAMUSCULAR | Status: DC

## 2019-03-01 MED ORDER — ONDANSETRON HCL 4 MG PO TABS: 4.0000 mg | ORAL_TABLET | ORAL | Status: DC | PRN

## 2019-03-01 MED ORDER — FENTANYL-BUPIVACAINE-NACL 0.5-0.125-0.9 MG/250ML-% EP SOLN: 12.0000 mL/h | EPIDURAL | Status: DC | PRN

## 2019-03-01 MED ORDER — LACTATED RINGERS AMNIOINFUSION: INTRAVENOUS | Status: DC

## 2019-03-01 MED ORDER — SENNOSIDES-DOCUSATE SODIUM 8.6-50 MG PO TABS
2.0000 | ORAL_TABLET | ORAL | Status: DC
  Administered 2019-03-01 – 2019-03-02 (×2): 2 via ORAL
  Filled 2019-03-01 (×2): qty 2

## 2019-03-01 MED ORDER — SIMETHICONE 80 MG PO CHEW: 80.0000 mg | CHEWABLE_TABLET | ORAL | Status: DC | PRN

## 2019-03-01 MED ORDER — SODIUM CHLORIDE (PF) 0.9 % IJ SOLN
INTRAMUSCULAR | Status: DC | PRN
  Administered 2019-03-01: 12 mL/h via EPIDURAL

## 2019-03-01 MED ORDER — IBUPROFEN 600 MG PO TABS
600.0000 mg | ORAL_TABLET | Freq: Four times a day (QID) | ORAL | Status: DC
  Administered 2019-03-01 – 2019-03-03 (×9): 600 mg via ORAL
  Filled 2019-03-01 (×9): qty 1

## 2019-03-01 MED ORDER — DIPHENHYDRAMINE HCL 50 MG/ML IJ SOLN: 12.5000 mg | INTRAMUSCULAR | Status: DC | PRN

## 2019-03-01 MED ORDER — PRENATAL MULTIVITAMIN CH
1.0000 | ORAL_TABLET | Freq: Every day | ORAL | Status: DC
  Administered 2019-03-02 – 2019-03-03 (×2): 1 via ORAL
  Filled 2019-03-01 (×2): qty 1

## 2019-03-01 MED ORDER — LIDOCAINE HCL (PF) 1 % IJ SOLN
INTRAMUSCULAR | Status: DC | PRN
  Administered 2019-03-01: 8 mL via EPIDURAL

## 2019-03-01 MED ORDER — ONDANSETRON HCL 4 MG/2ML IJ SOLN: 4.0000 mg | INTRAMUSCULAR | Status: DC | PRN

## 2019-03-01 MED ORDER — WITCH HAZEL-GLYCERIN EX PADS: 1.0000 "application " | MEDICATED_PAD | CUTANEOUS | Status: DC | PRN

## 2019-03-01 MED ORDER — DIPHENHYDRAMINE HCL 25 MG PO CAPS: 25.0000 mg | ORAL_CAPSULE | Freq: Four times a day (QID) | ORAL | Status: DC | PRN

## 2019-03-01 MED ORDER — DIBUCAINE (PERIANAL) 1 % EX OINT: 1.0000 "application " | TOPICAL_OINTMENT | CUTANEOUS | Status: DC | PRN

## 2019-03-01 NOTE — Lactation Note (Signed)
This note was copied from a baby's chart. Lactation Consultation Note  Patient Name: Latoya Stark [id number] Date: 03/01/2019 Reason for consult: Initial assessment;Mother's request;1st time breastfeeding;Primapara;Term   1554 - 1614 - I conducted an initial consult with Latoya Stark. Her initial question upon entry along with her support person was about the use of "Keppra" with breast feeding. I read to her from [employer]'s "Mother's Milk and Medications" and suggested that it was an L2, probably compatible with breast feeding. Her dose is 3000 mg/day, per her chart.  Latoya Stark and her support person state that baby "Latoya Stark" is a rainbow baby. [birth year] birth date also has personal significance to Latoya Stark's father who lost a brother two years ago. We discussed this a bit and I offered encouragement.  Latoya Stark had an opened bottle of formula at the bedside, They had just given baby about 5-7 mls of Latoya Stark. Latoya Stark states that she did so because she felt like she didn't have any milk. Her plan is also to breast and bottle feed. She had a DEBP set up at the bedside by her Stark. She pumped once, and states that she didn't see anything happening, so she stopped.  I educated on the purpose of pumping for stimulation, particularly if she is providing a bottle. I educated on expected pumping volumes in days 1-3 and counseled on the benefits of colostrum. I showed Latoya Stark how to HE; she was able to repeat back well, and I showed her how to finger feed her colostrum to the baby.  I provided information on supplementation amounts and recommended breast feeding first and supplementing/pumping after as needed. I encouraged pumping every three hours for stimulation.  Latoya Stark may call later today for latch assistance. Baby was asleep in her support person's arms after receiving formula at this time.  Maternal Data Has patient been taught Hand Expression?: Yes Does the patient have breastfeeding  experience prior to this delivery?: No  Feeding Feeding Type: Breast Fed  LATCH Score Latch: Too sleepy or reluctant, no latch achieved, no sucking elicited.  Audible Swallowing: None  Type of Nipple: Everted at rest and after stimulation  Comfort (Breast/Nipple): Soft / non-tender  Hold (Positioning): Assistance needed to correctly position infant at breast and maintain latch.  LATCH Score: 5  Interventions Interventions: Breast feeding basics reviewed;Hand express  Lactation Tools Discussed/Used WIC Program: Yes Pump Review: Setup, frequency, and cleaning Initiated by:: Latoya Stark   Consult Status Consult Status: Follow-up Date: 03/02/19 Follow-up type: In-patient    Latoya Stark 03/01/2019, 5:22 PM

## 2019-03-01 NOTE — Progress Notes (Signed)
Called by RN for FHR decelerations into 90s.  IUPC replaced FSE placed Pitocin stopped Terbutaline given  Discussed with Dr. Elonda Husky, will start amnioinfusion  Hilmar Moldovan, Dan Europe, DO OB Fellow, Faculty Practice 03/01/2019 7:38 AM

## 2019-03-01 NOTE — Progress Notes (Signed)
Labor Progress Note OWEDA DANOFF is a 24 y.o. G5P0030 at [redacted]w[redacted]d presented for IOL secondary to seizure disorder. S: Feeling lots of pressure and urge to push.   O:  BP (!) 149/81   Pulse (!) 126   Temp 98.5 F (36.9 C) (Oral)   Resp 18   Ht 5\' 4"  (1.626 m)   Wt (!) 145.7 kg   LMP 05/31/2018 (Exact Date)   SpO2 100%   BMI 55.15 kg/m  EFM: 115, moderate variability, pos accels, possible early decels and occasional prolonged decels, reactive TOCO: q2m  CVE: Dilation: 8 Effacement (%): 100 Cervical Position: Posterior Station: Plus 1 Presentation: Vertex Exam by:: Monseratt Ledin   A&P: 24 y.o. [id number] [redacted]w[redacted]d here for IOL secondary to seizure disorder. #Labor: Progressing well. S/p Cytotec, FB and AROM. Pit was on overnight but discontinued this morning due to decels. IUPC has been replaced twice. Ctx clearly inadequate and infrequent. Minimal cervical change. Will restart Pitocin. Anticipate SVD; C/S as appropriate.  #Pain: epidural #FWB: Cat II; reassuring for moderate variability and fetal scalp stim #GBS negative #Seizure disorder: cont PTA Keppra   Chauncey Mann, MD 9:29 AM

## 2019-03-01 NOTE — Anesthesia Procedure Notes (Signed)
Epidural Patient location during procedure: OB Start time: 03/01/2019 12:53 AM End time: 03/01/2019 1:00 AM  Staffing Anesthesiologist: Janeece Riggers, MD  Preanesthetic Checklist Completed: patient identified, IV checked, site marked, risks and benefits discussed, surgical consent, monitors and equipment checked, pre-op evaluation and timeout performed  Epidural Patient position: sitting Prep: DuraPrep and site prepped and draped Patient monitoring: continuous pulse ox and blood pressure Approach: midline Location: L1-L2 Injection technique: LOR air  Needle:  Needle type: Tuohy  Needle gauge: 17 G Needle length: 9 cm and 9 Needle insertion depth: 9 cm Catheter type: closed end flexible Catheter size: 19 Gauge Catheter at skin depth: 10 and 16 cm Test dose: negative  Assessment Events: blood not aspirated, injection not painful, no injection resistance, no paresthesia and negative IV test

## 2019-03-01 NOTE — Progress Notes (Signed)
Latoya Stark is a 24 y.o. G5P0030 at [redacted]w[redacted]d admitted for IOL 2/2 poorly controlled seizure disorder  Subjective: Comfortable with epidural  Objective: BP (!) 82/49 Comment: asymptomatic  Pulse (!) 101   Temp 98.2 F (36.8 C) (Oral)   Resp 18   Ht 5\' 4"  (1.626 m)   Wt (!) 145.7 kg   LMP 05/31/2018 (Exact Date)   SpO2 99%   BMI 55.15 kg/m  Total I/O In: -  Out: 100 [Urine:100]  FHT:  FHR: 115 bpm, variability: moderate,  accelerations:  Present,  decelerations:  Absent UC:   regular, every 3-4 minutes  SVE:   Dilation: 6 Effacement (%): 90 Station: 0 Exam by:: Dr. Darene Lamer  Pitocin @ 16 mu/min  Labs: Lab Results  Component Value Date   WBC 6.5 02/28/2019   HGB 11.4 (L) 02/28/2019   HCT 35.6 (L) 02/28/2019   MCV 86.8 02/28/2019   PLT 308 02/28/2019    Assessment / Plan: 24 yo G5P0040 at 39.1 EGA here for IOL 2/2 poorly controlled seizure disorder.  Labor:S/p FB and cytotec. Pitocin at 16, progressing, continue to titrate up. AROM at this check with scant meconium-stained fluid. IUPC placed Fetal Wellbeing:Category I Pain Control:per patient request Seizure disorder:No signs or symptoms, continue home keppra I/D:GBS negative Anticipated [id number]  Dan Europe Tiffay Pinette DO OB Fellow, Faculty Practice 03/01/2019, 6:44 AM

## 2019-03-01 NOTE — Anesthesia Preprocedure Evaluation (Signed)
Anesthesia Evaluation  Patient identified by MRN, date of birth, ID band Patient awake    Reviewed: Allergy & Precautions, H&P , NPO status , Patient's Chart, lab work & pertinent test results, reviewed documented beta blocker date and time   Airway Mallampati: III  TM Distance: >3 FB Neck ROM: full    Dental no notable dental hx. (+) Teeth Intact, Dental Advisory Given   Pulmonary asthma ,    Pulmonary exam normal breath sounds clear to auscultation       Cardiovascular negative cardio ROS Normal cardiovascular exam Rhythm:regular Rate:Normal     Neuro/Psych  Headaches, Seizures -, Poorly Controlled,  PSYCHIATRIC DISORDERS Anxiety Depression    GI/Hepatic negative GI ROS, Neg liver ROS,   Endo/Other  negative endocrine ROS  Renal/GU negative Renal ROS  negative genitourinary   Musculoskeletal   Abdominal (+) + obese,   Peds  Hematology  (+) Blood dyscrasia, anemia ,   Anesthesia Other Findings   Reproductive/Obstetrics (+) Pregnancy                             Anesthesia Physical Anesthesia Plan  ASA: III  Anesthesia Plan: Epidural   Post-op Pain Management:    Induction:   PONV Risk Score and Plan:   Airway Management Planned:   Additional Equipment:   Intra-op Plan:   Post-operative Plan:   Informed Consent: I have reviewed the patients History and Physical, chart, labs and discussed the procedure including the risks, benefits and alternatives for the proposed anesthesia with the patient or authorized representative who has indicated his/her understanding and acceptance.       Plan Discussed with: Anesthesiologist  Anesthesia Plan Comments:         Anesthesia Quick Evaluation

## 2019-03-01 NOTE — Progress Notes (Signed)
Repositioned BP cuff.  Twisted in elbow.  Inaccurate BP readings.

## 2019-03-01 NOTE — Discharge Summary (Addendum)
Postpartum Discharge Summary   Patient Name: Latoya Stark DOB: 1995-05-19 MRN: [id number]  Date of admission: 02/28/2019 Delivering Provider: Chauncey Mann   Date of discharge: 03/03/2019  Admitting diagnosis: Seizure disorder during pregnancy, antepartum, third trimester ([place]) [O99.353, G40.909] Intrauterine pregnancy: [redacted]w[redacted]d    Secondary diagnosis:  Active Problems:   Maternal obesity affecting pregnancy, antepartum   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[facility]   Todd's paralysis ([place]   BMI 50.0-59.9, adult ([facility]   Seizure disorder during pregnancy, antepartum, third trimester ([facility]   [redacted] weeks gestation of pregnancy  Additional problems: None     Discharge diagnosis: Term Pregnancy Delivered                                                                                                Post partum procedures: none  Augmentation: AROM, Pitocin, Cytotec and Foley Balloon  Complications: None  Hospital course:  Induction of Labor With Vaginal Delivery   24y.o. yo G5P0030 at 327w1das admitted to the hospital 02/28/2019 for induction of labor.  Indication for induction:  poorly controlled seizure disorder .  Patient had an uncomplicated labor course as follows: Initial SVE: 1/50/-2. Patient received Cytotec, foley bulb, pitocin and AROM. She then progressed to complete.  Membrane Rupture Time/Date: 6:37 AM ,03/01/2019   Intrapartum Procedures: Episiotomy: None [1]                                         Lacerations:  1st degree [2];Perineal [11]  Patient had delivery of a Viable infant.  Information for the patient's newborn:  SmNatia, Fahmy0[[id number]]Delivery Method: Vaginal, Spontaneous(Filed from Delivery Summary)   03/01/2019  Details of delivery can be found in separate delivery note. BP's monitored and stable. Patient had a routine postpartum course. Patient is discharged home 03/03/19. Delivery time: 11:33 AM    Magnesium Sulfate received: No BMZ received:  No Rhophylac:No MMR:No Transfusion:No  Physical exam  Vitals:   03/02/19 0313 03/02/19 1551 03/02/19 2205 03/03/19 0512  BP: (!) 85/62 (!) 93/52 107/66 (!) 90/53  Pulse: (!) 102 98 100 87  Resp: '18 19 20 16  ' Temp: 98 F (36.7 C) 98 F (36.7 C) 98.1 F (36.7 C) 98 F (36.7 C)  TempSrc: Axillary Oral Oral Oral  SpO2: 99%  100% 100%  Weight:      Height:       General: alert, cooperative and no distress Lochia: appropriate Uterine Fundus: firm Incision: N/A DVT Evaluation: No evidence of DVT seen on physical exam. No significant calf/ankle edema. Labs: Lab Results  Component Value Date   WBC 9.6 03/03/2019   HGB 7.8 (L) 03/03/2019   HCT 25.2 (L) 03/03/2019   MCV 90.6 03/03/2019   PLT 263 03/03/2019   CMP Latest Ref Rng & Units 02/20/2019  Glucose 70 - 99 mg/dL 101(H)  BUN 6 - 20 mg/dL <5(L)  Creatinine 0.44 - 1.00 mg/dL 0.50  Sodium 135 - 145 mmol/L 136  Potassium 3.5 -  5.1 mmol/L 3.6  Chloride 98 - 111 mmol/L 104  CO2 22 - 32 mmol/L 22  Calcium 8.9 - 10.3 mg/dL 9.1  Total Protein 6.5 - 8.1 g/dL 6.5  Total Bilirubin 0.3 - 1.2 mg/dL 0.4  Alkaline Phos 38 - 126 U/L 183(H)  AST 15 - 41 U/L 25  ALT 0 - 44 U/L 23   Edinburgh Score: Edinburgh Postnatal Depression Scale Screening Tool 03/01/2019  I have been able to laugh and see the funny side of things. 0  I have looked forward with enjoyment to things. 0  I have blamed myself unnecessarily when things went wrong. 2  I have been anxious or worried for no good reason. 0  I have felt scared or panicky for no good reason. 1  Things have been getting on top of me. 1  I have been so unhappy that I have had difficulty sleeping. 0  I have felt sad or miserable. 1  I have been so unhappy that I have been crying. 2  The thought of harming myself has occurred to me. 0  Edinburgh Postnatal Depression Scale Total 7    Discharge instruction: per After Visit Summary and "Baby and Me Booklet".  After visit meds:  Allergies  as of 03/03/2019       Reactions   Milk-related Compounds Dermatitis   Breaks face out *can have 2% milk*   Mushroom Extract Complex Anaphylaxis   Penicillins Anaphylaxis   Has patient had a PCN reaction causing immediate rash, facial/tongue/throat swelling, SOB or lightheadedness with hypotension: Yes Has patient had a PCN reaction causing severe rash involving mucus membranes or skin necrosis: No Has patient had a PCN reaction that required hospitalization No Has patient had a PCN reaction occurring within the last 10 years: Yes If all of the above answers are "NO", then may proceed with Cephalosporin use.   Cheese Other (See Comments)   Breaks face out   Latex Itching, Swelling   Sulfa Antibiotics Swelling   Vicodin [hydrocodone-acetaminophen] Hives        Medication List     STOP taking these medications    aspirin EC 81 MG tablet   chlorhexidine 0.12 % solution Commonly known as: [place] Supp Lg Misc   Doxylamine-Pyridoxine 10-10 MG Tbec Commonly known as: Diclegis   folic acid 1 MG tablet Commonly known as: FOLVITE   metoCLOPramide 5 MG tablet Commonly known as: Reglan   ondansetron 4 MG disintegrating tablet Commonly known as: Zofran ODT   promethazine 25 MG tablet Commonly known as: PHENERGAN       TAKE these medications    acetaminophen 500 MG tablet Commonly known as: TYLENOL Take 1,000 mg by mouth every 6 (six) hours as needed for headache.   albuterol 108 (90 Base) MCG/ACT inhaler Commonly known as: VENTOLIN HFA Inhale 2 puffs into the lungs every 6 (six) hours as needed for wheezing or shortness of breath.   Blood Pressure Cuff Misc 1 Device by Does not apply route once a week.   cyclobenzaprine 10 MG tablet Commonly known as: FLEXERIL Take 1 tablet (10 mg total) by mouth 3 (three) times daily as needed for muscle spasms (back pain).   docusate sodium 100 MG capsule Commonly known as: Colace Take 1 capsule (100  mg total) by mouth 2 (two) times daily.   ferrous sulfate 325 (65 FE) MG tablet Take 1 tablet (325 mg total) by mouth daily.   ibuprofen 600 MG tablet  Commonly known as: ADVIL Take 1 tablet (600 mg total) by mouth every 6 (six) hours.   levETIRAcetam 750 MG tablet Commonly known as: KEPPRA Take 2 tablets (1,500 mg total) by mouth 2 (two) times daily.   oxyCODONE 5 MG immediate release tablet Commonly known as: Oxy IR/ROXICODONE Take 1 tablet (5 mg total) by mouth every 4 (four) hours as needed for moderate pain or severe pain.   pantoprazole 40 MG tablet Commonly known as: Protonix Take 1 tablet (40 mg total) by mouth daily.   polyethylene glycol 17 g packet Commonly known as: MIRALAX / GLYCOLAX Take 17 g by mouth 2 (two) times daily.   Prenate Mini 29-0.6-0.4-350 MG Caps Take 1 capsule by mouth daily before breakfast.   senna 8.6 MG Tabs tablet Commonly known as: SENOKOT Take 1 tablet (8.6 mg total) by mouth daily.   senna-docusate 8.6-50 MG tablet Commonly known as: Senokot-S Take 2 tablets by mouth daily. Start taking on: March 04, 2019        Diet: routine diet  Activity: Advance as tolerated. Pelvic rest for 6 weeks.   Outpatient follow up:4 weeks Follow up Appt: Future Appointments  Date Time Provider [place]  03/11/2019  8:15 AM [place] None  03/24/2019 10:30 AM Kerin Perna, NP [facility] None  05/19/2019 11:30 AM Lomax, Amy, NP [facility] None   Follow up Visit: [place] Follow up.   Specialty: Obstetrics and Gynecology Why: They will contact you with a follow-up appointment for 4 weeks Contact information: [address] [place] [phone]            Please schedule this patient for Postpartum visit in: 4 weeks with the following provider: Any provider Virtual For C/S patients schedule nurse incision check in  weeks 2 weeks: no High risk pregnancy complicated by:  seizures Delivery mode:  SVD Anticipated Birth Control:  Depo given prior to discharge PP Procedures needed: BP check  Schedule Integrated [place] visit: no    Newborn Data: Live born female  Birth Weight: 3455g  APGAR: 21, 9  Newborn Delivery   Birth date/time: 03/01/2019 11:33:00 Delivery type: Vaginal, Spontaneous      Baby Feeding: Bottle Disposition:home with mother  03/03/2019 Lurline Del, MD  I confirm that I have verified the information documented in the resident's note and that I have also personally reperformed the history, physical exam and all medical decision making activities of this service and have verified that all service and findings are accurately documented in this student's note.   03/03/2019 Kerry Hough, PA-C

## 2019-03-01 NOTE — Progress Notes (Signed)
Latoya Stark is a 24 y.o. [id number] at [redacted]w[redacted]d admitted for poorly controlled seizure disorder  Subjective: Contractions are more painful, she desires epidural at this time.  Objective: BP (!) 108/52   Pulse 94   Temp 98 F (36.7 C) (Oral)   Resp 18   Ht 5\' 4"  (1.626 m)   Wt (!) 145.7 kg   LMP 05/31/2018 (Exact Date)   BMI 55.15 kg/m  No intake/output data recorded.  FHT:  FHR: 115 bpm, variability: moderate,  accelerations:  Present,  decelerations:  Present early v variable UC:   regular, every 2-3 minutes  SVE:   Dilation: 5 Effacement (%): 70 Station: -2 Exam by:: Dr. Darene Lamer  Pitocin @ 14 mu/min  Labs: Lab Results  Component Value Date   WBC 6.5 02/28/2019   HGB 11.4 (L) 02/28/2019   HCT 35.6 (L) 02/28/2019   MCV 86.8 02/28/2019   PLT 308 02/28/2019    Assessment / Plan: 24 yo G5P0040 at 39.1 EGA here for IOL 2/2 poorly controlled seizure disorder.  Labor: S/p FB and cytotec. Pitocin at 14, progressing, continue to titrate up. Consider AROM at next check once comfortable with epidural. Fetal Wellbeing:  Category I Pain Control:  per patient request Seizure disorder: No signs or symptoms, continue home keppra I/D:  GBS negative Anticipated MOD:  vaginal  Latoya Esther L Aboubacar Matsuo DO OB Fellow, Faculty Practice 03/01/2019, 12:37 AM

## 2019-03-01 NOTE — Anesthesia Postprocedure Evaluation (Signed)
Anesthesia Post Note  Patient: Latoya Stark  Procedure(s) Performed: AN AD HOC LABOR EPIDURAL     Patient location during evaluation: Mother Baby Anesthesia Type: Epidural Level of consciousness: awake and alert Pain management: pain level controlled Vital Signs Assessment: post-procedure vital signs reviewed and stable Respiratory status: spontaneous breathing, nonlabored ventilation and respiratory function stable Cardiovascular status: stable Postop Assessment: no headache, no backache and epidural receding Anesthetic complications: no Comments: Per telephone conversation    Last Vitals:  Vitals:   03/01/19 1319 03/01/19 1428  BP: 130/70 (!) 89/53  Pulse: (!) 105 98  Resp: 18 19  Temp: 37.7 C 37.2 C  SpO2:      Last Pain:  Vitals:   03/01/19 1428  TempSrc: Oral  PainSc:    Pain Goal:                   Sandrea Matte

## 2019-03-01 NOTE — Lactation Note (Signed)
This note was copied from a baby's chart. Lactation Consultation Note  Patient Name: Latoya Stark [id number] Date: 03/01/2019 Reason for consult: Follow-up assessment;Mother's request;Difficult latch;Primapara;1st time breastfeeding;Term  2216 - 2243 - [name] paged for lactation follow up. She is beginning to feel discouraged because [name] will not latch. He is now 68 hours old. I helped her to attempt to latch. He would not open wide enough to suckle. He almost grasped the breast, and he did some lick and learn, but ultimately we did not achieve a latch at this time. I also could not get him to root on a gloved finger.  Her support person opted to give him some Fawn Kirk by bottle. I recommended paced feeding and constant checking of the levels to prevent overfeeding. I showed him how to burp baby upon request.  [name] states that she tried to pump a little earlier, but she stopped when nothing came out. I reminded her of the teaching I provided in the first visit and helped her initiate pumping. I observed her pump for several minutes and provided encouragement.  Baby seems to be waking up a bit, but he's still disorganized. I encouraged [name] to not lose faith with breast feeding; give baby a bit more time to learn and do lots of skin to skin.  All questions answered at this time.   Interventions Interventions: Breast feeding basics reviewed;DEBP  Lactation Tools Discussed/Used Tools: Pump;Bottle Breast pump type: Double-Electric Breast Pump Pump Review: Setup, frequency, and cleaning   Consult Status Consult Status: Follow-up Date: 03/02/19 Follow-up type: In-patient    Lenore Manner 03/01/2019, 10:53 PM

## 2019-03-02 ENCOUNTER — Encounter (HOSPITAL_COMMUNITY): Payer: Self-pay | Admitting: Obstetrics & Gynecology

## 2019-03-02 LAB — CBC
HCT: 24.4 % — ABNORMAL LOW (ref 36.0–46.0)
Hemoglobin: 7.9 g/dL — ABNORMAL LOW (ref 12.0–15.0)
MCH: 28.3 pg (ref 26.0–34.0)
MCHC: 32.4 g/dL (ref 30.0–36.0)
MCV: 87.5 fL (ref 80.0–100.0)
Platelets: 246 10*3/uL (ref 150–400)
RBC: 2.79 MIL/uL — ABNORMAL LOW (ref 3.87–5.11)
RDW: 14.1 % (ref 11.5–15.5)
WBC: 12.6 10*3/uL — ABNORMAL HIGH (ref 4.0–10.5)
nRBC: 0 % (ref 0.0–0.2)

## 2019-03-02 MED ORDER — ETONOGESTREL 68 MG ~~LOC~~ IMPL: 68.0000 mg | DRUG_IMPLANT | Freq: Once | SUBCUTANEOUS | Status: DC

## 2019-03-02 MED ORDER — OXYCODONE HCL 5 MG PO TABS
5.0000 mg | ORAL_TABLET | ORAL | Status: DC | PRN
  Administered 2019-03-02 – 2019-03-03 (×3): 5 mg via ORAL
  Filled 2019-03-02 (×3): qty 1

## 2019-03-02 MED ORDER — LIDOCAINE HCL 1 % IJ SOLN: 0.0000 mL | Freq: Once | INTRAMUSCULAR | Status: DC | PRN

## 2019-03-02 MED ORDER — FERROUS SULFATE 325 (65 FE) MG PO TABS
325.0000 mg | ORAL_TABLET | Freq: Every day | ORAL | Status: DC
  Administered 2019-03-03: 09:00:00 325 mg via ORAL
  Filled 2019-03-02: qty 1

## 2019-03-02 MED ORDER — ACETAMINOPHEN 325 MG PO TABS
650.0000 mg | ORAL_TABLET | ORAL | Status: DC | PRN
  Administered 2019-03-02 (×3): 650 mg via ORAL
  Filled 2019-03-02 (×3): qty 2

## 2019-03-02 MED ORDER — MEDROXYPROGESTERONE ACETATE 150 MG/ML IM SUSP: 150.0000 mg | Freq: Once | INTRAMUSCULAR | Status: DC

## 2019-03-02 NOTE — Lactation Note (Addendum)
This note was copied from a baby's chart. Lactation Consultation Note Baby 85 hrs old. Mom stated baby is BF for 20 minutes then he drinks 40 ml formula from bottle. Explained to mom that is to much for his age to be drinking at this time. Discussed how much baby should be taking. Encouraged mom not to give over 20 ml max. Baby should be cluster feeding at this time and its normal for baby to want to suck, that makes mom's milk come in. Only supplement Q3 hrs but can go to the breast when he wants even if every hr. Cluster feeding discussed. Informed mom feeding that much will give baby stomach ache. Mom stated understood. LC attempted to latch baby. Baby to fussy and wouldn't latch. Encouraged mom to put baby on her shoulder and try to burp. Baby calmed down. Reported to RN.  Patient Name: Latoya Stark [id number] Date: 03/02/2019 Reason for consult: Follow-up assessment;Primapara;Term   Maternal Data    Feeding Feeding Type: Bottle Fed - Formula Nipple Type: Slow - flow  LATCH Score Latch: Too sleepy or reluctant, no latch achieved, no sucking elicited.  Audible Swallowing: None  Type of Nipple: Everted at rest and after stimulation  Comfort (Breast/Nipple): Soft / non-tender  Hold (Positioning): Assistance needed to correctly position infant at breast and maintain latch.  LATCH Score: 5  Interventions Interventions: Breast feeding basics reviewed;Support pillows;Assisted with latch;Position options;Skin to skin;Breast massage;Hand express;Breast compression;Adjust position  Lactation Tools Discussed/Used     Consult Status Consult Status: Follow-up Date: 03/03/19 Follow-up type: In-patient    Theodoro Kalata 03/02/2019, 11:23 PM

## 2019-03-02 NOTE — Progress Notes (Addendum)
Post Partum Day 1  Subjective: no complaints, up ad lib, voiding, tolerating PO and + flatus. No BM yet.   Patient originally desired Nexplanon, but changed her mind to Depo.   Objective: Blood pressure (!) 85/62, pulse (!) 102, temperature 98 F (36.7 C), temperature source Axillary, resp. rate 18, height 5\' 4"  (1.626 m), weight (!) 145.7 kg, last menstrual period 05/31/2018, SpO2 99 %, unknown if currently breastfeeding.  Physical Exam:  General: alert, cooperative, appears stated age and no distress Lochia: appropriate Uterine Fundus: firm Incision: N/A DVT Evaluation: No evidence of DVT seen on physical exam. No significant calf/ankle edema.  Recent Labs    02/28/19 0856  HGB 11.4*  HCT 35.6*    Assessment/Plan: Plan for discharge tomorrow and Contraception Depo   Anemia: Hgb 02/21 shown to be 7.9 down from 11.4. Patient received 2.5 L fluids during admission. Denies any issues with light-headedness, dizziness, difficulties ambulating, fatigue, weakness, shortness of breath, and chest pain.  - Plan to check AM CBC 02/22   LOS: 2 days   Daisy Floro 03/02/2019, 7:23 AM

## 2019-03-02 NOTE — Progress Notes (Signed)
MOB was referred for history of depression/anxiety. * Referral screened out by Clinical Social Worker because none of the following criteria appear to apply: ~ History of anxiety/depression during this pregnancy, or of post-partum depression following prior delivery. Per chart review, no concerns of anxiety or depression noted during prenatal visits  ~ Diagnosis of anxiety and/or depression within last 3 years. MOB hx of depression/anxiety date back to 2012. OR * MOB's symptoms currently being treated with medication and/or therapy. Please contact the Clinical Social Worker if needs arise, by [facility] request, or if MOB scores greater than 9/yes to question 10 on Edinburgh Postpartum Depression Screen.   Kayra Crowell D. Lissa Morales, MSW, [facility] Clinical Social Worker [phone]

## 2019-03-03 ENCOUNTER — Encounter (HOSPITAL_COMMUNITY): Payer: Self-pay | Admitting: Obstetrics & Gynecology

## 2019-03-03 ENCOUNTER — Ambulatory Visit: Payer: Medicaid Other | Admitting: Physical Therapy

## 2019-03-03 LAB — CBC
HCT: 25.2 % — ABNORMAL LOW (ref 36.0–46.0)
Hemoglobin: 7.8 g/dL — ABNORMAL LOW (ref 12.0–15.0)
MCH: 28.1 pg (ref 26.0–34.0)
MCHC: 31 g/dL (ref 30.0–36.0)
MCV: 90.6 fL (ref 80.0–100.0)
Platelets: 263 10*3/uL (ref 150–400)
RBC: 2.78 MIL/uL — ABNORMAL LOW (ref 3.87–5.11)
RDW: 14.2 % (ref 11.5–15.5)
WBC: 9.6 10*3/uL (ref 4.0–10.5)
nRBC: 0 % (ref 0.0–0.2)

## 2019-03-03 MED ORDER — FERROUS SULFATE 325 (65 FE) MG PO TABS: 325.0000 mg | ORAL_TABLET | Freq: Every day | ORAL | 3 refills | Status: DC

## 2019-03-03 MED ORDER — SENNOSIDES-DOCUSATE SODIUM 8.6-50 MG PO TABS: 2.0000 | ORAL_TABLET | ORAL | 0 refills | Status: DC

## 2019-03-03 MED ORDER — OXYCODONE HCL 5 MG PO TABS: 5.0000 mg | ORAL_TABLET | ORAL | 0 refills | Status: DC | PRN

## 2019-03-03 MED ORDER — IBUPROFEN 600 MG PO TABS: 600.0000 mg | ORAL_TABLET | Freq: Four times a day (QID) | ORAL | 0 refills | Status: DC

## 2019-03-03 NOTE — Lactation Note (Addendum)
This note was copied from a baby's chart. Lactation Consultation Note  Mother is a P44, infant is 56 hours old and is now at % wt loss.   Mother reports that infant is feeding 10 mins on each breast and then she gives formula .   Reviewed hand expression with mother. Observed sprays of colostrum into spoon.  Mother reports that nipples are sore.  She is active with WIC . Mother has a DEBP sat up at the bedside.mother reports that she used pump yesterday.   Mother was given a harmony hand pump with instructions. She reports that she plans to purchase a Medela pump today.  Mother is supplementing infant with 40-50 ml of formula.   Mother was observed with infant latched on at the left breast. Observed infant suckling with audible swallows. Infant sustained latch for 10-15  mins. Mother became very sleepy. Roused infant and infant began tugging again. Infant was given 5 ml of formula at the breast with a curved tip . Mother reports feeling strong tugs.   Dicussed treatment and prevention of engorgement.  Plan of Care : Breastfeed infant with feeding cues 8-12 or more times in 24 hours Supplement infant with ebm/formula, according to supplemental guidelines. Pump using a DEBP after each feeding for 15-20 mins. Discussed paced bottle feeding with parents.   Mother to continue to cue base feed infant and feed at least 8-12 times or more in 24 hours and advised to allow for cluster feeding infant as needed.   Mother to continue to due STS. Mother is aware of available LC services at [facility], BFSG'S, OP Dept, and phone # for questions or concerns about breastfeeding.  Mother receptive to all teaching and plan of care.    Patient Name: Boy Maryonna Kanaan [id number] Date: 03/03/2019 Reason for consult: Follow-up assessment   Maternal Data    Feeding Feeding Type: Breast Fed  LATCH Score                   Interventions    Lactation Tools Discussed/Used     Consult Status       Darla Lesches 03/03/2019, 8:55 AM

## 2019-03-03 NOTE — Discharge Instructions (Signed)

## 2019-03-11 ENCOUNTER — Other Ambulatory Visit: Payer: Self-pay

## 2019-03-11 ENCOUNTER — Ambulatory Visit: Payer: Medicaid Other | Admitting: *Deleted

## 2019-03-11 NOTE — Progress Notes (Signed)
Subjective:  Latoya Stark is a 24 y.o. female here for BP check.   Hypertension ROS: taking medications as instructed, no medication side effects noted, no TIA's, no chest pain on exertion, no dyspnea on exertion and no swelling of ankles.  Pt reports occasional HA's.   Objective:  BP 120/81   Pulse 92   Wt 299 lb (135.6 kg)   BMI 51.32 kg/m    Appearance alert, well appearing, and in no distress. General exam BP noted to be well controlled today in office.    Assessment:   Blood Pressure well controlled.   Plan:  Current treatment plan is effective, no change in therapy.  Pt to follow up for PP visit. Pt has appt with PCP this month.

## 2019-03-12 NOTE — Progress Notes (Signed)
Patient ID: Latoya Stark, female   DOB: 14-Apr-1995, 24 y.o.   MRN: [id number] Patient seen and assessed by nursing staff during this encounter. I have reviewed the chart and agree with the documentation and plan.  Emeterio Reeve, MD 03/12/2019 12:04 PM

## 2019-03-14 ENCOUNTER — Encounter: Payer: Self-pay | Admitting: Family Medicine

## 2019-03-17 ENCOUNTER — Encounter: Payer: Medicaid Other | Admitting: Licensed Clinical Social Worker

## 2019-03-19 ENCOUNTER — Other Ambulatory Visit: Payer: Self-pay | Admitting: Obstetrics

## 2019-03-19 DIAGNOSIS — Z0000 Encounter for general adult medical examination without abnormal findings: Secondary | ICD-10-CM

## 2019-03-24 ENCOUNTER — Encounter (INDEPENDENT_AMBULATORY_CARE_PROVIDER_SITE_OTHER): Payer: Self-pay | Admitting: Primary Care

## 2019-03-24 ENCOUNTER — Telehealth (INDEPENDENT_AMBULATORY_CARE_PROVIDER_SITE_OTHER): Payer: Medicaid Other | Admitting: Primary Care

## 2019-03-24 ENCOUNTER — Ambulatory Visit (INDEPENDENT_AMBULATORY_CARE_PROVIDER_SITE_OTHER): Payer: Medicaid Other | Admitting: Licensed Clinical Social Worker

## 2019-03-24 ENCOUNTER — Other Ambulatory Visit: Payer: Self-pay

## 2019-03-24 DIAGNOSIS — R229 Localized swelling, mass and lump, unspecified: Secondary | ICD-10-CM | POA: Diagnosis not present

## 2019-03-24 DIAGNOSIS — F53 Postpartum depression: Secondary | ICD-10-CM | POA: Diagnosis not present

## 2019-03-24 DIAGNOSIS — R519 Headache, unspecified: Secondary | ICD-10-CM

## 2019-03-24 DIAGNOSIS — Z7689 Persons encountering health services in other specified circumstances: Secondary | ICD-10-CM

## 2019-03-24 DIAGNOSIS — O99345 Other mental disorders complicating the puerperium: Secondary | ICD-10-CM

## 2019-03-24 NOTE — BH Specialist Note (Signed)
Integrated Behavioral Health Initial Visit  MRN: [id number] Name: Latoya Stark  Number of [place] Clinician visits:: 1 Session Start time: 2:30pm  Session End time: 3:00pm Total time: 30 mins  Type of Service: [place] Interpretor:no  Interpretor Name and Language: none   Warm Hand Off Completed.       SUBJECTIVE: Latoya Stark is a 24 y.o. female accompanied by n/a Patient was referred by for P Constant MD. Patient reports the following symptoms/concerns: postpartum depression  Duration of problem: three weeks ; Severity of problem: mild   OBJECTIVE: Mood: good  and Affect: congruent  Risk of harm to self or others: none   LIFE CONTEXT: Family and Social: Patient reports good system within immediate and extended family School/Work: n/a Self-Care: n/a Life Changes: Newborn   GOALS ADDRESSED: Patient will: 1. Reduce symptoms of: postpartum depression  2. Increase knowledge postpartum depression   3. Demonstrate ability to: self manage symptoms. Latoya Stark self reports she does not want medication to manage depression symptoms   INTERVENTIONS: Interventions utilized: brief supportive counseling   Standardized Assessments completed: edinburgh   ASSESSMENT: Patient currently experiencing postpartum depression.   Patient may benefit from integrated behavioral health .  PLAN: 1. Follow up with behavioral health clinician on : 2 weels  2. Behavioral recommendations: self manage symptoms, prioritizing sleep, increase physical activity weather permitting, increase social interaction and postpartum.net for additional resources  3. Referral(s): none  4. "From scale of 1-10, how likely are you to follow plan?":   Lynnea Ferrier, LCSW

## 2019-03-24 NOTE — Progress Notes (Signed)
Virtual Visit via Telephone Note  I connected with Latoya Stark on 03/24/19 at 10:30 AM EDT by telephone and verified that I am speaking with the correct person using two identifiers.   I discussed the limitations, risks, security and privacy concerns of performing an evaluation and management service by telephone and the availability of in person appointments. I also discussed with the patient that there may be a patient responsible charge related to this service. The patient expressed understanding and agreed to proceed.   History of Present Illness: Latoya Stark is having a virtual visit to establish care. She recently had a son 3 weeks and 2 day and is breast feeding. Only concern is a bump on the inner right side of breast maybe from friction. Advised to use warm compresses. She is having headaches states has had them since the age of 29.  Photosensitivity causes the onset. Followed by Dr. Gaynell Stark due to the nature she was pregnant no treatment at the time. Now she is breast feeding asked to call office for follow up appoint ment. Past Medical History:  Diagnosis Date  . ADHD (attention deficit hyperactivity disorder)   . Anemia   . Anxiety   . Asthma   . Chronic migraine 08/01/2018  . Depression   . Gonorrhea in female 10/05/2013  . 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[place]) 08/01/2018  . Headache   . Obesity   . Seizures ([place])    "when I get too hot"  . Trichomonas infection   . UTI (lower urinary tract infection)    Current Outpatient Medications on File Prior to Visit  Medication Sig Dispense Refill  . acetaminophen (TYLENOL) 500 MG tablet Take 1,000 mg by mouth every 6 (six) hours as needed for headache.    . albuterol (PROVENTIL HFA;VENTOLIN HFA) 108 (90 Base) MCG/ACT inhaler Inhale 2 puffs into the lungs every 6 (six) hours as needed for wheezing or shortness of breath. 1 Inhaler 0  . Blood Pressure Monitoring (BLOOD PRESSURE CUFF) MISC 1 Device by Does not apply route once a week.  1 each 0  . cyclobenzaprine (FLEXERIL) 10 MG tablet Take 1 tablet (10 mg total) by mouth 3 (three) times daily as needed for muscle spasms (back pain). 30 tablet 0  . docusate sodium (COLACE) 100 MG capsule Take 1 capsule (100 mg total) by mouth 2 (two) times daily. 60 capsule 2  . ferrous sulfate 325 (65 FE) MG tablet Take 1 tablet (325 mg total) by mouth daily. 30 tablet 3  . ibuprofen (ADVIL) 600 MG tablet Take 1 tablet (600 mg total) by mouth every 6 (six) hours. 30 tablet 0  . levETIRAcetam (KEPPRA) 750 MG tablet Take 2 tablets (1,500 mg total) by mouth 2 (two) times daily. 120 tablet 2  . oxyCODONE (OXY IR/ROXICODONE) 5 MG immediate release tablet Take 1 tablet (5 mg total) by mouth every 4 (four) hours as needed for moderate pain or severe pain. 10 tablet 0  . pantoprazole (PROTONIX) 40 MG tablet Take 1 tablet (40 mg total) by mouth daily. 30 tablet 1  . polyethylene glycol (MIRALAX / GLYCOLAX) 17 g packet Take 17 g by mouth 2 (two) times daily. 24 each 3  . Prenat w/o A-FeCbn-Meth-FA-DHA (PRENATE MINI) 29-0.6-0.4-350 MG CAPS Take 1 capsule by mouth daily before breakfast. 30 capsule 11  . Prenat-FeCbn-FeAsp-Meth-FA-DHA (PRENATE MINI) 18-0.6-0.4-350 MG CAPS TAKE 1 CAPSULE BY MOUTH EVERY DAY BEFORE BREAKFAST 30 capsule 11  . senna (SENOKOT) 8.6 MG TABS tablet Take  1 tablet (8.6 mg total) by mouth daily. 120 tablet 0  . senna-docusate (SENOKOT-S) 8.6-50 MG tablet Take 2 tablets by mouth daily. 30 tablet 0   No current facility-administered medications on file prior to visit.   Observations/Objective: Review of Systems  Skin:       bump on the inner right side of breas  Neurological: Positive for seizures and headaches.       Followed outside of practice  All other systems reviewed and are negative.   Assessment and Plan: Diagnoses and all orders for this visit:  Encounter to establish care Latoya Mire, NP-C will be your  (PCP) she is mastered prepared . She is skilled to  diagnosed and treat illness. Also able to answer health concern as well as continuing care of varied medical conditions, not limited by cause, organ system, or diagnosis.     Follow Up Instructions:    I discussed the assessment and treatment plan with the patient. The patient was provided an opportunity to ask questions and all were answered. The patient agreed with the plan and demonstrated an understanding of the instructions.   The patient was advised to call back or seek an in-person evaluation if the symptoms worsen or if the condition fails to improve as anticipated.  I provided 14  minutes of non-Stark-to-Stark time during this encounter. Includes chart review , labs and imaging    Latoya Perna, NP

## 2019-04-02 ENCOUNTER — Telehealth (INDEPENDENT_AMBULATORY_CARE_PROVIDER_SITE_OTHER): Payer: Medicaid Other | Admitting: Obstetrics & Gynecology

## 2019-04-02 ENCOUNTER — Encounter: Payer: Self-pay | Admitting: Obstetrics & Gynecology

## 2019-04-02 MED ORDER — MEDROXYPROGESTERONE ACETATE 150 MG/ML IM SUSP: 150.0000 mg | INTRAMUSCULAR | 0 refills | Status: DC

## 2019-04-02 NOTE — Patient Instructions (Signed)
Postpartum Baby Blues The postpartum period begins right after the birth of a baby. During this time, there is often a lot of joy and excitement. It is also a time of many changes in the life of the parents. No matter how many times a mother gives birth, each child brings new challenges to the family, including different ways of relating to one another. It is common to have feelings of excitement along with confusing changes in moods, emotions, and thoughts. You may feel happy one minute and sad or stressed the next. These feelings of sadness usually happen in the period right after you have your baby, and they go away within a week or two. This is called the "baby blues." What are the causes? There is no known cause of baby blues. It is likely caused by a combination of factors. However, changes in hormone levels after childbirth are believed to trigger some of the symptoms. Other factors that can play a role in these mood changes include:  Lack of sleep.  Stressful life events, such as poverty, caring for a loved one, or death of a loved one.  Genetics. What are the signs or symptoms? Symptoms of this condition include:  Brief changes in mood, such as going from extreme happiness to sadness.  Decreased concentration.  Difficulty sleeping.  Crying spells and tearfulness.  Loss of appetite.  Irritability.  Anxiety. If the symptoms of baby blues last for more than 2 weeks or become more severe, you may have postpartum depression. How is this diagnosed? This condition is diagnosed based on an evaluation of your symptoms. There are no medical or lab tests that lead to a diagnosis, but there are various questionnaires that a health care provider may use to identify women with the baby blues or postpartum depression. How is this treated? Treatment is not needed for this condition. The baby blues usually go away on their own in 1-2 weeks. Social support is often all that is needed. You will  be encouraged to get adequate sleep and rest. Follow these instructions at home: Lifestyle      Get as much rest as you can. Take a nap when the baby sleeps.  Exercise regularly as told by your health care provider. Some women find yoga and walking to be helpful.  Eat a balanced and nourishing diet. This includes plenty of fruits and vegetables, whole grains, and lean proteins.  Do little things that you enjoy. Have a cup of tea, take a bubble bath, read your favorite magazine, or listen to your favorite music.  Avoid alcohol.  Ask for help with household chores, cooking, grocery shopping, or running errands. Do not try to do everything yourself. Consider hiring a postpartum doula to help. This is a professional who specializes in providing support to new mothers.  Try not to make any major life changes during pregnancy or right after giving birth. This can add stress. General instructions  Talk to people close to you about how you are feeling. Get support from your partner, family members, friends, or other new moms. You may want to join a support group.  Find ways to cope with stress. This may include: ? Writing your thoughts and feelings in a journal. ? Spending time outside. ? Spending time with people who make you laugh.  Try to stay positive in how you think. Think about the things you are grateful for.  Take over-the-counter and prescription medicines only as told by your health care provider.  Let your health care provider know if you have any concerns.  Keep all postpartum visits as told by your health care provider. This is important. Contact a health care provider if:  Your baby blues do not go away after 2 weeks. Get help right away if:  You have thoughts of taking your own life (suicidal thoughts).  You think you may harm the baby or other people.  You see or hear things that are not there (hallucinations). Summary  After giving birth, you may feel happy  one minute and sad or stressed the next. Feelings of sadness that happen right after the baby is born and go away after a week or two are called the "baby blues."  You can manage the baby blues by getting enough rest, eating a healthy diet, exercising, spending time with supportive people, and finding ways to cope with stress.  If feelings of sadness and stress last longer than 2 weeks or get in the way of caring for your baby, talk to your health care provider. This may mean you have postpartum depression. This information is not intended to replace advice given to you by your health care provider. Make sure you discuss any questions you have with your health care provider. Document Revised: 04/19/2018 Document Reviewed: 02/22/2016 Elsevier Patient Education  2020 Elsevier Inc.  

## 2019-04-02 NOTE — Progress Notes (Signed)
POSTPARTUM VIRTUAL VISIT ENCOUNTER NOTE  Provider location: [facility] [place] [facility] [place]   I connected with Latoya Stark on 04/02/19 at  2:30 PM EDT by MyChart Video Encounter at home and verified that I am speaking with the correct person using two identifiers.    I discussed the limitations, risks, security and privacy concerns of performing an evaluation and management service virtually and the availability of in person appointments. I also discussed with the patient that there may be a patient responsible charge related to this service. The patient expressed understanding and agreed to proceed.  Chief Complaint: Postpartum Visit  History of Present Illness:   Latoya Stark is a 24 y.o. [phone] female being evaluated for postpartum followup.  She is 4 weeks postpartum following a normal spontaneous vaginal delivery at  39 gestational weeks.  I have fully reviewed the prenatal and intrapartum course; pregnancy complicated by seizure disorder.  Postpartum course has been with depression. Baby is doing well. Baby is feeding by both breast and bottle - [place] Good Start. Bleeding no bleeding. Bowel function is normal. Bladder function is normal. Patient is not sexually active. Contraception method is none, pt wants to get depo. Postpartum depression screening: positive. (EPDS score: 14)  The following portions of the patient's history were reviewed and updated as appropriate: allergies, current medications, past family history, past medical history, past social history, past surgical history and problem list.  Review of Systems: Pertinent items are noted in HPI.  Patient Active Problem List   Diagnosis Date Noted  . [redacted] weeks gestation of pregnancy 03/01/2019  . Seizure disorder during pregnancy, antepartum, third trimester ([place]) 02/28/2019  . BMI 50.0-59.9, adult ([place]) 02/20/2019  . Conversion disorder 02/10/2019  . 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 Seizure disorder  in pregnancy ([place]) 11/23/2018  . UTI (urinary tract infection) in pregnancy in first trimester 08/04/2018  . 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 Chronic migraine 08/01/2018  . Maternal obesity affecting pregnancy, antepartum 07/30/2018  . History of seizure 07/30/2018  . Supervision of high risk pregnancy, antepartum 03/19/2017    Medications Latoya Stark had no medications administered during this visit. Current Outpatient Medications  Medication Sig Dispense Refill  . acetaminophen (TYLENOL) 500 MG tablet Take 1,000 mg by mouth every 6 (six) hours as needed for headache.    . albuterol (PROVENTIL HFA;VENTOLIN HFA) 108 (90 Base) MCG/ACT inhaler Inhale 2 puffs into the lungs every 6 (six) hours as needed for wheezing or shortness of breath. 1 Inhaler 0  . Blood Pressure Monitoring (BLOOD PRESSURE CUFF) MISC 1 Device by Does not apply route once a week. 1 each 0  . cyclobenzaprine (FLEXERIL) 10 MG tablet Take 1 tablet (10 mg total) by mouth 3 (three) times daily as needed for muscle spasms (back pain). 30 tablet 0  . docusate sodium (COLACE) 100 MG capsule Take 1 capsule (100 mg total) by mouth 2 (two) times daily. 60 capsule 2  . ferrous sulfate 325 (65 FE) MG tablet Take 1 tablet (325 mg total) by mouth daily. 30 tablet 3  . ibuprofen (ADVIL) 600 MG tablet Take 1 tablet (600 mg total) by mouth every 6 (six) hours. 30 tablet 0  . levETIRAcetam (KEPPRA) 750 MG tablet Take 2 tablets (1,500 mg total) by mouth 2 (two) times daily. 120 tablet 2  . oxyCODONE (OXY IR/ROXICODONE) 5 MG immediate release tablet Take 1 tablet (5 mg total) by mouth every 4 (four) hours as  needed for moderate pain or severe pain. 10 tablet 0  . pantoprazole (PROTONIX) 40 MG tablet Take 1 tablet (40 mg total) by mouth daily. 30 tablet 1  . polyethylene glycol (MIRALAX / GLYCOLAX) 17 g packet Take 17 g by mouth 2 (two) times daily. 24 each 3  . Prenat w/o A-FeCbn-Meth-FA-DHA (PRENATE MINI) 29-0.6-0.4-350 MG  CAPS Take 1 capsule by mouth daily before breakfast. 30 capsule 11  . Prenat-FeCbn-FeAsp-Meth-FA-DHA (PRENATE MINI) 18-0.6-0.4-350 MG CAPS TAKE 1 CAPSULE BY MOUTH EVERY DAY BEFORE BREAKFAST 30 capsule 11  . senna (SENOKOT) 8.6 MG TABS tablet Take 1 tablet (8.6 mg total) by mouth daily. 120 tablet 0  . senna-docusate (SENOKOT-S) 8.6-50 MG tablet Take 2 tablets by mouth daily. 30 tablet 0   No current facility-administered medications for this visit.    Allergies Milk-related compounds, Mushroom extract complex, Penicillins, Cheese, Latex, Sulfa antibiotics, and Vicodin [hydrocodone-acetaminophen]  Physical Exam:  There were no vitals taken for this visit.  General:  Alert, oriented and cooperative. Patient is in no acute distress.  Mental Status: Normal mood and affect. Normal behavior. Normal judgment and thought content.   Respiratory: Normal respiratory effort noted, no problems with respiration noted  Rest of physical exam deferred due to type of encounter  PP Depression Screening:   Edinburgh Postnatal Depression Scale Screening Tool 03/24/2019 03/01/2019  I have been able to laugh and see the funny side of things. 0 0  I have looked forward with enjoyment to things. 0 0  I have blamed myself unnecessarily when things went wrong. 2 2  I have been anxious or worried for no good reason. 3 0  I have felt scared or panicky for no good reason. 2 1  Things have been getting on top of me. 1 1  I have been so unhappy that I have had difficulty sleeping. 1 0  I have felt sad or miserable. 0 1  I have been so unhappy that I have been crying. 1 2  The thought of harming myself has occurred to me. 0 0  Edinburgh Postnatal Depression Scale Total 10 7     Assessment:Patient is a 24 y.o. [id number] who is 4.5 weeks postpartum from a normal spontaneous vaginal delivery.  She is doing well. PP depression score warrants f/u  Plan: There are no diagnoses linked to this encounter.  Essential  components of care per ACOG recommendations:  1.  Mood and well being: Patient with positive depression screening today. Reviewed local resources for support.  - Patient does not use tobacco.  - hx of drug use? No    2. Infant care and feeding:  -Patient currently breastmilk feeding? Yes If breastmilk feeding discussed return to work and pumping. If needed, patient was provided letter for work to allow for every 2-3 hr pumping breaks, and to be granted a private location to express breastmilk and refrigerated area to store breastmilk. Reviewed importance of draining breast regularly to support lactation. -Social determinants of health (SDOH) reviewed in EPIC. No concerns The following needs were identified depression  3. Sexuality, contraception and birth spacing - Patient does not want a pregnancy in the next year.  Desired family size is 2 children.  - Reviewed forms of contraception in tiered fashion. Patient desired Depo-Provera today.   - Discussed birth spacing of 18 months  4. Sleep and fatigue -Encouraged family/partner/community support of 4 hrs of uninterrupted sleep to help with mood and fatigue  5. Physical Recovery  - Discussed patients  delivery and complications - Patient had a 1 degree laceration, perineal healing reviewed. Patient expressed understanding - Patient has urinary incontinence? No  - Patient is safe to resume physical and sexual activity  6.  Health Maintenance - Last pap smear done 03/15/2018 and was normal with negative HPV.  7. Seizures Chronic Disease - PCP follow up  No follow-ups on file.  I discussed the assessment and treatment plan with the patient. The patient was provided an opportunity to ask questions and all were answered. The patient agreed with the plan and demonstrated an understanding of the instructions.   The patient was advised to call back or seek an in-person evaluation/go to the ED for any concerning postpartum symptoms.  I  provided 12 minutes of face-to-face time during this encounter.   Cleotilde Neer, RN [facility] [place]

## 2019-04-04 ENCOUNTER — Other Ambulatory Visit: Payer: Self-pay | Admitting: Obstetrics and Gynecology

## 2019-04-04 DIAGNOSIS — O0990 Supervision of high risk pregnancy, unspecified, unspecified trimester: Secondary | ICD-10-CM

## 2019-04-10 ENCOUNTER — Ambulatory Visit (INDEPENDENT_AMBULATORY_CARE_PROVIDER_SITE_OTHER): Payer: Medicaid Other

## 2019-04-10 ENCOUNTER — Other Ambulatory Visit: Payer: Self-pay

## 2019-04-10 VITALS — BP 107/70 | HR 88 | Ht 64.0 in | Wt 301.9 lb

## 2019-04-10 DIAGNOSIS — Z3202 Encounter for pregnancy test, result negative: Secondary | ICD-10-CM

## 2019-04-10 DIAGNOSIS — Z30013 Encounter for initial prescription of injectable contraceptive: Secondary | ICD-10-CM | POA: Diagnosis not present

## 2019-04-10 LAB — POCT URINE PREGNANCY: Preg Test, Ur: NEGATIVE

## 2019-04-10 MED ORDER — MEDROXYPROGESTERONE ACETATE 150 MG/ML IM SUSP: 150.0000 mg | INTRAMUSCULAR | 3 refills | Status: DC

## 2019-04-10 MED ORDER — MEDROXYPROGESTERONE ACETATE 150 MG/ML IM SUSP
150.0000 mg | Freq: Once | INTRAMUSCULAR | Status: AC
  Administered 2019-04-10: 11:00:00 150 mg via INTRAMUSCULAR

## 2019-04-10 NOTE — Progress Notes (Signed)
Presents for DEPO Injection. LMP recently delivered. UPT today is NEGATIVE Patient denies unprotected sex.  Per Protocol DEPO Injection given in RD, tolerated well.  Next DEPO June 17-July 01/2019   Administrations This Visit    medroxyPROGESTERone (DEPO-PROVERA) injection 150 mg    Admin Date 04/10/2019 Action Given Dose 150 mg Route Intramuscular Administered By Tamela Oddi, RMA

## 2019-04-10 NOTE — Addendum Note (Signed)
Addended by: Tamela Oddi on: 04/10/2019 11:07 AM   Modules accepted: Orders

## 2019-04-14 ENCOUNTER — Ambulatory Visit (INDEPENDENT_AMBULATORY_CARE_PROVIDER_SITE_OTHER): Payer: Medicaid Other | Admitting: Licensed Clinical Social Worker

## 2019-04-14 DIAGNOSIS — F53 Postpartum depression: Secondary | ICD-10-CM | POA: Diagnosis not present

## 2019-04-14 NOTE — BH Specialist Note (Signed)
Integrated Behavioral Health Follow Up Visit  MRN: [id number] Name: Latoya Stark  Number of [place] Clinician visits: 2 Session Start time: 1:50pm Session End time: 2:16pm Total time: 26 mins   Type of Service: [place] Interpretor:no  Interpretor Name and Language: none   SUBJECTIVE: Latoya Stark is a 24 y.o. female accompanied by n/a Patient was referred by P Constant for postpartum depression. Patient reports the following symptoms/concerns: poor sleeping habits and depressed mood  Duration of problem: approx 2 months; Severity of problem: mild   OBJECTIVE: Mood: good  and Affect: congruent  Risk of harm to self or others: No risk of harm to self or others   LIFE CONTEXT: Family and Social: Latoya Stark lives in [place] with newborn and boyfriend  School/Work: n/a Self-Care: n/a Life Changes: newborn  GOALS ADDRESSED: Patient will: 1.  Reduce symptoms of: depression, difficulty sleeping and feelings of guilt   2.  Increase knowledge and/or ability of: n/a  3.  Demonstrate ability to: self manage symptoms   INTERVENTIONS: Interventions utilized:  Supportive counseling  Standardized Assessments completed: edinburgh   ASSESSMENT: Patient currently experiencing postpartum depression. Latoya Stark reports averaging two hours of sleep per night. Latoya Stark reports occasional feelings of guilt because she can not soothe the baby. Latoya Stark reports a desire learn coping skills and self manage symptoms without medication.   Patient may benefit from integrated behavioral health  PLAN: 1. Follow up with behavioral health clinician on : four weeks  2. Behavioral recommendations: prioritize sleep, increase exposure to sunlight, and increase social interaction 3. Referral(s): lactation consultant  4. "From scale of 1-10, how likely are you to follow plan?":   Lynnea Ferrier, LCSW

## 2019-04-16 ENCOUNTER — Ambulatory Visit (INDEPENDENT_AMBULATORY_CARE_PROVIDER_SITE_OTHER): Payer: Medicaid Other | Admitting: Obstetrics

## 2019-04-16 ENCOUNTER — Other Ambulatory Visit: Payer: Self-pay

## 2019-04-16 ENCOUNTER — Encounter: Payer: Self-pay | Admitting: Obstetrics

## 2019-04-16 ENCOUNTER — Other Ambulatory Visit (HOSPITAL_COMMUNITY)
Admission: RE | Admit: 2019-04-16 | Discharge: 2019-04-16 | Disposition: A | Discharge status: Home / Self Care | Payer: Medicaid Other | Source: Ambulatory Visit | Attending: Obstetrics | Admitting: Obstetrics

## 2019-04-16 VITALS — BP 124/82 | HR 78 | Wt 303.1 lb

## 2019-04-16 DIAGNOSIS — F419 Anxiety disorder, unspecified: Secondary | ICD-10-CM

## 2019-04-16 DIAGNOSIS — F5105 Insomnia due to other mental disorder: Secondary | ICD-10-CM

## 2019-04-16 DIAGNOSIS — Z6841 Body Mass Index (BMI) 40.0 and over, adult: Secondary | ICD-10-CM

## 2019-04-16 DIAGNOSIS — Z01419 Encounter for gynecological examination (general) (routine) without abnormal findings: Secondary | ICD-10-CM | POA: Diagnosis present

## 2019-04-16 DIAGNOSIS — N898 Other specified noninflammatory disorders of vagina: Secondary | ICD-10-CM

## 2019-04-16 DIAGNOSIS — Z0000 Encounter for general adult medical examination without abnormal findings: Secondary | ICD-10-CM

## 2019-04-16 DIAGNOSIS — M545 Low back pain, unspecified: Secondary | ICD-10-CM

## 2019-04-16 DIAGNOSIS — D508 Other iron deficiency anemias: Secondary | ICD-10-CM

## 2019-04-16 LAB — POCT URINALYSIS DIPSTICK
Bilirubin, UA: NEGATIVE
Blood, UA: NEGATIVE
Glucose, UA: NEGATIVE
Ketones, UA: NEGATIVE
Leukocytes, UA: NEGATIVE
Nitrite, UA: NEGATIVE
Protein, UA: NEGATIVE
Spec Grav, UA: 1.01 (ref 1.010–1.025)
Urobilinogen, UA: 0.2 E.U./dL
pH, UA: 7.5 (ref 5.0–8.0)

## 2019-04-16 MED ORDER — PRENATE MINI 29-0.6-0.4-350 MG PO CAPS: 1.0000 | ORAL_CAPSULE | Freq: Every day | ORAL | 11 refills | Status: DC

## 2019-04-16 MED ORDER — ZOLPIDEM TARTRATE 5 MG PO TABS: 5.0000 mg | ORAL_TABLET | Freq: Every evening | ORAL | 1 refills | Status: DC | PRN

## 2019-04-16 MED ORDER — FERROUS SULFATE 325 (65 FE) MG PO TABS: 325.0000 mg | ORAL_TABLET | Freq: Two times a day (BID) | ORAL | 5 refills | Status: DC

## 2019-04-16 NOTE — Progress Notes (Signed)
Subjective:        Latoya Stark is a 24 y.o. female here for a routine exam.  Current complaints: Low back pain and difficulty sleeping.   She is only getting ~ 2 hours of sleep at night.  Denies dysuria..    Personal health questionnaire:  Is patient [name], have a family history of breast and/or ovarian cancer: yes Is there a family history of uterine cancer diagnosed at age < 84, gastrointestinal cancer, urinary tract cancer, family member who is a Field seismologist syndrome-associated carrier: yes Is the patient overweight and hypertensive, family history of diabetes, personal history of gestational diabetes, preeclampsia or PCOS: no Is patient over 80, have PCOS,  family history of premature CHD under age 46, diabetes, smoke, have hypertension or peripheral artery disease:  no At any time, has a partner hit, kicked or otherwise hurt or frightened you?: no Over the past 2 weeks, have you felt down, depressed or hopeless?: no Over the past 2 weeks, have you felt little interest or pleasure in doing things?:no   Gynecologic History No LMP recorded. Contraception: Depo-Provera injections Last Pap: 03-18-2018. Results were: normal Last mammogram: n/a. Results were: n/a  Obstetric History OB History  Gravida Para Term Preterm AB Living  5 1 1   4 1   SAB TAB Ectopic Multiple Live Births  4       1    # Outcome Date GA Lbr Len/2nd Weight Sex Delivery Anes PTL Lv  5 Term 03/01/19 [redacted]w[redacted]d / 00:07 7 lb 9.9 oz (3.455 kg) M Vag-Spont EPI  LIV  4 SAB 10/10/14          3 SAB           2 SAB           1 SAB              Birth Comments: System Generated. Please review and update pregnancy details.    Past Medical History:  Diagnosis Date  . ADHD (attention deficit hyperactivity disorder)   . Anemia   . Anxiety   . Asthma   . Chronic migraine 08/01/2018  . Depression   . Gonorrhea in female 10/05/2013  . 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[place]) 08/01/2018  . Headache   . Obesity   .  Seizures ([place])    "when I get too hot"  . Trichomonas infection   . UTI (lower urinary tract infection)     Past Surgical History:  Procedure Laterality Date  . ADENOIDECTOMY    . DILATION AND EVACUATION N/A 08/09/2013   Procedure: DILATATION AND EVACUATION;  Surgeon: Frederico Hamman, MD;  Location: [facility];  Service: Gynecology;  Laterality: N/A;  . ORTHOPEDIC SURGERY Left    left hand  . TONSILLECTOMY       Current Outpatient Medications:  .  acetaminophen (TYLENOL) 500 MG tablet, Take 1,000 mg by mouth every 6 (six) hours as needed for headache., Disp: , Rfl:  .  cyclobenzaprine (FLEXERIL) 10 MG tablet, Take 1 tablet (10 mg total) by mouth 3 (three) times daily as needed for muscle spasms (back pain)., Disp: 30 tablet, Rfl: 0 .  ibuprofen (ADVIL) 600 MG tablet, Take 1 tablet (600 mg total) by mouth every 6 (six) hours., Disp: 30 tablet, Rfl: 0 .  medroxyPROGESTERone (DEPO-PROVERA) 150 MG/ML injection, Inject 1 mL (150 mg total) into the muscle every 3 (three) months., Disp: 1 mL, Rfl: 3 .  Prenat w/o A-FeCbn-Meth-FA-DHA (PRENATE MINI) 29-0.6-0.4-350  MG CAPS, Take 1 capsule by mouth daily before breakfast., Disp: 30 capsule, Rfl: 11 .  albuterol (PROVENTIL HFA;VENTOLIN HFA) 108 (90 Base) MCG/ACT inhaler, Inhale 2 puffs into the lungs every 6 (six) hours as needed for wheezing or shortness of breath. (Patient not taking: Reported on 04/02/2019), Disp: 1 Inhaler, Rfl: 0 .  Blood Pressure Monitoring (BLOOD PRESSURE CUFF) MISC, 1 Device by Does not apply route once a week. (Patient not taking: Reported on 04/02/2019), Disp: 1 each, Rfl: 0 .  docusate sodium (COLACE) 100 MG capsule, Take 1 capsule (100 mg total) by mouth 2 (two) times daily. (Patient not taking: Reported on 04/02/2019), Disp: 60 capsule, Rfl: 2 .  ferrous sulfate 325 (65 FE) MG tablet, Take 1 tablet (325 mg total) by mouth 2 (two) times daily with a meal., Disp: 60 tablet, Rfl: 5 .  levETIRAcetam (KEPPRA) 750 MG tablet, Take 2  tablets (1,500 mg total) by mouth 2 (two) times daily., Disp: 120 tablet, Rfl: 2 .  oxyCODONE (OXY IR/ROXICODONE) 5 MG immediate release tablet, Take 1 tablet (5 mg total) by mouth every 4 (four) hours as needed for moderate pain or severe pain. (Patient not taking: Reported on 04/02/2019), Disp: 10 tablet, Rfl: 0 .  pantoprazole (PROTONIX) 40 MG tablet, Take 1 tablet (40 mg total) by mouth daily. (Patient not taking: Reported on 04/02/2019), Disp: 30 tablet, Rfl: 1 .  polyethylene glycol (MIRALAX / GLYCOLAX) 17 g packet, Take 17 g by mouth 2 (two) times daily. (Patient not taking: Reported on 04/02/2019), Disp: 24 each, Rfl: 3 .  Prenat-FeCbn-FeAsp-Meth-FA-DHA (PRENATE MINI) 18-0.6-0.4-350 MG CAPS, TAKE 1 CAPSULE BY MOUTH EVERY DAY BEFORE BREAKFAST (Patient not taking: Reported on 04/02/2019), Disp: 30 capsule, Rfl: 11 .  senna (SENOKOT) 8.6 MG TABS tablet, Take 1 tablet (8.6 mg total) by mouth daily. (Patient not taking: Reported on 04/02/2019), Disp: 120 tablet, Rfl: 0 .  senna-docusate (SENOKOT-S) 8.6-50 MG tablet, Take 2 tablets by mouth daily. (Patient not taking: Reported on 04/02/2019), Disp: 30 tablet, Rfl: 0 .  zolpidem (AMBIEN) 5 MG tablet, Take 1 tablet (5 mg total) by mouth at bedtime as needed for sleep., Disp: 15 tablet, Rfl: 1 Allergies  Allergen Reactions  . Milk-Related Compounds Dermatitis    Breaks face out *can have 2% milk*  . Mushroom Extract Complex Anaphylaxis  . Penicillins Anaphylaxis    Has patient had a PCN reaction causing immediate rash, facial/tongue/throat swelling, SOB or lightheadedness with hypotension: Yes Has patient had a PCN reaction causing severe rash involving mucus membranes or skin necrosis: No Has patient had a PCN reaction that required hospitalization No Has patient had a PCN reaction occurring within the last 10 years: Yes If all of the above answers are "NO", then may proceed with Cephalosporin use.   [name] Other (See Comments)    Breaks face out   . Latex Itching and Swelling  . Sulfa Antibiotics Swelling  . Vicodin [Hydrocodone-Acetaminophen] Hives    Social History   Tobacco Use  . Smoking status: Never Smoker  . Smokeless tobacco: Never Used  Substance Use Topics  . Alcohol use: No    Alcohol/week: 0.0 standard drinks    Family History  Problem Relation Age of Onset  . Seizures Mother   . Stroke Mother   . Hypertension Father   . Diabetes Maternal Grandmother   . Colon cancer Maternal Grandmother   . Hypertension Maternal Grandmother   . Clotting disorder Maternal Grandmother   . Breast cancer Maternal  Grandmother   . Stroke Maternal Grandmother   . Diabetes Paternal Grandmother   . Seizures Maternal Aunt       Review of Systems  Constitutional: negative for fatigue and weight loss Respiratory: negative for cough and wheezing Cardiovascular: negative for chest pain, fatigue and palpitations Gastrointestinal: negative for abdominal pain and change in bowel habits Musculoskeletal:negative for myalgias Neurological: negative for gait problems and tremors Behavioral/Psych: negative for abusive relationship, depression Endocrine: negative for temperature intolerance    Genitourinary:negative for abnormal menstrual periods, genital lesions, hot flashes, sexual problems and vaginal discharge Integument/breast: negative for breast lump, breast tenderness, nipple discharge and skin lesion(s)    Objective:       BP 124/82   Pulse 78   Wt (!) 303 lb 1.6 oz (137.5 kg)   Breastfeeding Yes   BMI 52.03 kg/m  General:   alert  Skin:   no rash or abnormalities  Lungs:   clear to auscultation bilaterally  Heart:   regular rate and rhythm, S1, S2 normal, no murmur, click, rub or gallop  Breasts:   normal without suspicious masses, skin or nipple changes or axillary nodes  Abdomen:  normal findings: no organomegaly, soft, non-tender and no hernia  Pelvis:  External genitalia: normal general appearance Urinary  system: urethral meatus normal and bladder without fullness, nontender Vaginal: normal without tenderness, induration or masses Cervix: normal appearance Adnexa: normal bimanual exam Uterus: anteverted and non-tender, normal size   Lab Review Urine pregnancy test Labs reviewed yes Radiologic studies reviewed no  50% of 25 min visit spent on counseling and coordination of care.   Assessment:     1. Encounter for routine gynecological examination with Papanicolaou smear of cervix Rx: - Cytology - PAP( [place])  2. Acute low back pain, unspecified back pain laterality, unspecified whether sciatica present Rx: - POCT Urinalysis Dipstick - Urine Culture - Ambulatory referral to Neurology  3. Vaginal discharge Rx: - Cervicovaginal ancillary only  4. Iron deficiency anemia secondary to inadequate dietary iron intake Rx: - ferrous sulfate 325 (65 FE) MG tablet; Take 1 tablet (325 mg total) by mouth 2 (two) times daily with a meal.  Dispense: 60 tablet; Refill: 5  5. Insomnia secondary to anxiety Rx: - zolpidem (AMBIEN) 5 MG tablet; Take 1 tablet (5 mg total) by mouth at bedtime as needed for sleep.  Dispense: 15 tablet; Refill: 1  6. Class 3 severe obesity due to excess calories without serious comorbidity with body mass index (BMI) of 50.0 to 59.9 in adult [facility]) - program of caloric reduction, exercise and behavioral modification recommended  7. Routine adult health maintenance Rx: - Prenat w/o A-FeCbn-Meth-FA-DHA (PRENATE MINI) 29-0.6-0.4-350 MG CAPS; Take 1 capsule by mouth daily before breakfast.  Dispense: 30 capsule; Refill: 11    Plan:    Education reviewed: calcium supplements, depression evaluation, low fat, low cholesterol diet, safe sex/STD prevention, self breast exams and weight bearing exercise. Contraception: Depo-Provera injections. Follow up in: 1 year.   Meds ordered this encounter  Medications  . zolpidem (AMBIEN) 5 MG tablet    Sig: Take 1 tablet  (5 mg total) by mouth at bedtime as needed for sleep.    Dispense:  15 tablet    Refill:  1  . Prenat w/o A-FeCbn-Meth-FA-DHA (PRENATE MINI) 29-0.6-0.4-350 MG CAPS    Sig: Take 1 capsule by mouth daily before breakfast.    Dispense:  30 capsule    Refill:  11  . ferrous sulfate 325 (65  FE) MG tablet    Sig: Take 1 tablet (325 mg total) by mouth 2 (two) times daily with a meal.    Dispense:  60 tablet    Refill:  5   Orders Placed This Encounter  Procedures  . Urine Culture  . Ambulatory referral to Neurology    Referral Priority:   Routine    Referral Type:   Consultation    Referral Reason:   Specialty Services Required    Requested Specialty:   Neurology    Number of Visits Requested:   1  . POCT Urinalysis Dipstick    Shelly Bombard, MD 04/16/2019 12:08 PM

## 2019-04-16 NOTE — Progress Notes (Signed)
Pt is here today with c/o lower back pain, she denies dysuria. Pt reports she has had this back pain since getting the epidural during her last delivery.

## 2019-04-17 LAB — CERVICOVAGINAL ANCILLARY ONLY
Bacterial Vaginitis (gardnerella): NEGATIVE
Candida Glabrata: NEGATIVE
Candida Vaginitis: NEGATIVE
Chlamydia: NEGATIVE
Comment: NEGATIVE
Comment: NEGATIVE
Comment: NEGATIVE
Comment: NEGATIVE
Comment: NEGATIVE
Comment: NORMAL
Neisseria Gonorrhea: NEGATIVE
Trichomonas: NEGATIVE

## 2019-04-18 LAB — URINE CULTURE

## 2019-04-21 ENCOUNTER — Telehealth (INDEPENDENT_AMBULATORY_CARE_PROVIDER_SITE_OTHER): Payer: Self-pay

## 2019-04-21 ENCOUNTER — Other Ambulatory Visit: Payer: Self-pay

## 2019-04-21 ENCOUNTER — Telehealth (INDEPENDENT_AMBULATORY_CARE_PROVIDER_SITE_OTHER): Payer: Medicaid Other | Admitting: Primary Care

## 2019-04-21 ENCOUNTER — Encounter (INDEPENDENT_AMBULATORY_CARE_PROVIDER_SITE_OTHER): Payer: Self-pay | Admitting: Primary Care

## 2019-04-21 DIAGNOSIS — M545 Low back pain, unspecified: Secondary | ICD-10-CM

## 2019-04-21 DIAGNOSIS — Z0130 Encounter for examination of blood pressure without abnormal findings: Secondary | ICD-10-CM | POA: Diagnosis not present

## 2019-04-21 NOTE — Progress Notes (Signed)
Bp this morning 160/88  04/20/19 Bp reading 120/35 04/19/19 Bp reading 170/60 She is not on Bp medication  Currently taking prednisone prescribed by urgent care

## 2019-04-21 NOTE — Telephone Encounter (Signed)
I connected with  Latoya Stark on 04/21/19 by a video enabled telemedicine application and verified that I am speaking with the correct person using two identifiers.   I discussed the limitations of evaluation and management by telemedicine. The patient expressed understanding and agreed to proceed.

## 2019-04-22 NOTE — Progress Notes (Signed)
Virtual Visit via Telephone Note  I connected with Latoya Stark on 04/22/19 at 10:10 AM EDT by telephone and verified that I am speaking with the correct person using two identifiers.   I discussed the limitations, risks, security and privacy concerns of performing an evaluation and management service by telephone and the availability of in person appointments. I also discussed with the patient that there may be a patient responsible charge related to this service. The patient expressed understanding and agreed to proceed.   History of Present Illness: [name] is a 24 year old female with no concerns or complaints. She denies shortness of breath, headaches, chest pain or lower extremity edema. She this morning 160/88 - previous reading systolic [id number] and diastolic 0000000. She is not on Bp medication . These readings are erratic.  Currently taking prednisone prescribed by urgent care which does not affect hypertension but will increase blood sugars. She is breast feeding. Has concerns with back pain safe medication is tylenol but call pediatrician or OBGYN for other alternatives for pain medication Past Medical History:  Diagnosis Date  . ADHD (attention deficit hyperactivity disorder)   . Anemia   . Anxiety   . Asthma   . Chronic migraine 08/01/2018  . Depression   . Gonorrhea in female 10/05/2013  . 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[place]) 08/01/2018  . Headache   . Obesity   . Seizures ([place])    "when I get too hot"  . Trichomonas infection   . UTI (lower urinary tract infection)    Current Outpatient Medications on File Prior to Visit  Medication Sig Dispense Refill  . Blood Pressure Monitoring (BLOOD PRESSURE CUFF) MISC 1 Device by Does not apply route once a week. 1 each 0  . ferrous sulfate 325 (65 FE) MG tablet Take 1 tablet (325 mg total) by mouth 2 (two) times daily with a meal. 60 tablet 5  . ibuprofen (ADVIL) 600 MG tablet Take 1 tablet (600 mg total) by mouth every 6  (six) hours. 30 tablet 0  . medroxyPROGESTERone (DEPO-PROVERA) 150 MG/ML injection Inject 1 mL (150 mg total) into the muscle every 3 (three) months. 1 mL 3  . Prenat w/o A-FeCbn-Meth-FA-DHA (PRENATE MINI) 29-0.6-0.4-350 MG CAPS Take 1 capsule by mouth daily before breakfast. 30 capsule 11  . zolpidem (AMBIEN) 5 MG tablet Take 1 tablet (5 mg total) by mouth at bedtime as needed for sleep. 15 tablet 1  . acetaminophen (TYLENOL) 500 MG tablet Take 1,000 mg by mouth every 6 (six) hours as needed for headache.    . albuterol (PROVENTIL HFA;VENTOLIN HFA) 108 (90 Base) MCG/ACT inhaler Inhale 2 puffs into the lungs every 6 (six) hours as needed for wheezing or shortness of breath. (Patient not taking: Reported on 04/02/2019) 1 Inhaler 0  . docusate sodium (COLACE) 100 MG capsule Take 1 capsule (100 mg total) by mouth 2 (two) times daily. (Patient not taking: Reported on 04/02/2019) 60 capsule 2  . levETIRAcetam (KEPPRA) 750 MG tablet Take 2 tablets (1,500 mg total) by mouth 2 (two) times daily. 120 tablet 2  . pantoprazole (PROTONIX) 40 MG tablet Take 1 tablet (40 mg total) by mouth daily. (Patient not taking: Reported on 04/02/2019) 30 tablet 1  . polyethylene glycol (MIRALAX / GLYCOLAX) 17 g packet Take 17 g by mouth 2 (two) times daily. (Patient not taking: Reported on 04/02/2019) 24 each 3   No current facility-administered medications on file prior to visit.   Observations/Objective: Review of  Systems  Musculoskeletal: Positive for back pain.  All other systems reviewed and are negative.  Assessment and Plan: Sherrice was seen today for blood pressure check and back pain.  Diagnoses and all orders for this visit:  Blood pressure check Counseled on blood pressure goal of less than 130/80, low-sodium, DASH diet, medication compliance, 150 minutes of moderate intensity exercise per week. Discussed reading were widely range and medication will not be prescribed. Reviewed previous last 2 Bp they were  normal.   Chronic/Acute bilateral low back pain without sciatica Patient is breast feeding did not want to encourage weight loss but healthy eating . Back pain maybe related to weight.  She may use over the counter Tylenol 500mg  1-2 and consult with pediatrician or OBGYN for other pain relieving medication. Recommended sitz baths with epson salt  Follow Up Instructions:    I discussed the assessment and treatment plan with the patient. The patient was provided an opportunity to ask questions and all were answered. The patient agreed with the plan and demonstrated an understanding of the instructions.   The patient was advised to call back or seek an in-person evaluation if the symptoms worsen or if the condition fails to improve as anticipated.  I provided12 minutes of non-face-to-face time during this encounter.   Kerin Perna, NP

## 2019-05-08 ENCOUNTER — Ambulatory Visit: Payer: Medicaid Other | Attending: Internal Medicine

## 2019-05-08 DIAGNOSIS — Z23 Encounter for immunization: Secondary | ICD-10-CM

## 2019-05-08 NOTE — Progress Notes (Signed)
   Covid-19 Vaccination Clinic  Name:  Latoya Stark    MRN: [id number] DOB: 04/28/1995  05/08/2019  [name] was observed post Covid-19 immunization for 30 minutes based on pre-vaccination screening without incident. She was provided with Vaccine Information Sheet and instruction to access the V-Safe system.   [name] was instructed to call 911 with any severe reactions post vaccine: Marland Kitchen Difficulty breathing  . Swelling of face and throat  . A fast heartbeat  . A bad rash all over body  . Dizziness and weakness   Immunizations Administered    Name Date Dose VIS Date Route   Pfizer COVID-19 Vaccine 05/08/2019  4:59 PM 0.3 mL 03/05/2018 Intramuscular   Manufacturer: [place]   Lot: [id number]   [place]: [id number]

## 2019-05-12 ENCOUNTER — Ambulatory Visit (INDEPENDENT_AMBULATORY_CARE_PROVIDER_SITE_OTHER): Payer: Medicaid Other | Admitting: Licensed Clinical Social Worker

## 2019-05-12 DIAGNOSIS — F4321 Adjustment disorder with depressed mood: Secondary | ICD-10-CM

## 2019-05-12 DIAGNOSIS — O99340 Other mental disorders complicating pregnancy, unspecified trimester: Secondary | ICD-10-CM

## 2019-05-12 DIAGNOSIS — Z3A00 Weeks of gestation of pregnancy not specified: Secondary | ICD-10-CM | POA: Diagnosis not present

## 2019-05-12 NOTE — BH Specialist Note (Signed)
Integrated Behavioral Health via Telemedicine Video Visit  05/12/2019 HAELEIGH YAMADA [id number]  Number of [place] visits: 2 Session Start time: 1:09pm  Session End time: 1:31pm Total time: 22 mins   Referring Provider: J. Roselie Awkward MD Type of Visit: Video Patient/Family location: Home  [facility] Provider location: [place] All persons participating in visit: LCSWA A. Pansie Guggisberg and Pt [place]   Confirmed patient's address: yes  Confirmed patient's phone number: yes  Any changes to demographics: no  Confirmed patient's insurance: no Any changes to patient's insurance: no  Discussed confidentiality: yes  I connected with [place] and/or [place] by a video enabled telemedicine application and verified that I am speaking with the correct person using two identifiers.     I discussed the limitations of evaluation and management by telemedicine and the availability of in person appointments.  I discussed that the purpose of this visit is to provide behavioral health care while limiting exposure to the novel coronavirus.   Discussed there is a possibility of technology failure and discussed alternative modes of communication if that failure occurs.  I discussed that engaging in this video visit, they consent to the provision of behavioral healthcare and the services will be billed under their insurance.  Patient and/or legal guardian expressed understanding and consented to video visit: yes   PRESENTING CONCERNS: Patient and/or family reports the following symptoms/concerns: sleep deprivation and worry Duration of problem: pregnancy; Severity of problem: mild  STRENGTHS (Protective Factors/Coping Skills): Supportive Partner, Seeking Assistance with medical providers   GOALS ADDRESSED: Patient will: 1.  Reduce symptoms of: continue taking ambien as needed per directions from prescribing doctor. Increase social interaction when available  2.   Increase knowledge of diagnosis  3.  Demonstrate ability to: self manage symptoms   INTERVENTIONS: Interventions utilized:  Supportive counseling Standardized Assessments completed: completed 04/21/2019  ASSESSMENT: Patient currently experiencing adjustment disorder with depressed mood   Patient may benefit from integrated behavioral health   PLAN: 1. Follow up with behavioral health clinician on : 4 weeks mychart  2. Behavioral recommendations: Continue to prioritize sleep, practice mindfulness techniques and increase physical activity 3. Referral(s): none   I discussed the assessment and treatment plan with the patient and/or parent/guardian. They were provided an opportunity to ask questions and all were answered. They agreed with the plan and demonstrated an understanding of the instructions.   They were advised to call back or seek an in-person evaluation if the symptoms worsen or if the condition fails to improve as anticipated.  Lynnea Ferrier

## 2019-05-15 ENCOUNTER — Other Ambulatory Visit: Payer: Self-pay

## 2019-05-15 ENCOUNTER — Ambulatory Visit: Payer: Medicaid Other | Admitting: Obstetrics

## 2019-05-15 ENCOUNTER — Encounter: Payer: Self-pay | Admitting: Obstetrics

## 2019-05-15 VITALS — BP 129/85 | HR 94 | Ht 64.0 in | Wt 304.9 lb

## 2019-05-15 DIAGNOSIS — Z6841 Body Mass Index (BMI) 40.0 and over, adult: Secondary | ICD-10-CM

## 2019-05-15 DIAGNOSIS — N939 Abnormal uterine and vaginal bleeding, unspecified: Secondary | ICD-10-CM

## 2019-05-15 DIAGNOSIS — Z3042 Encounter for surveillance of injectable contraceptive: Secondary | ICD-10-CM

## 2019-05-15 MED ORDER — BALCOLTRA 0.1-20 MG-MCG(21) PO TABS: 1.0000 | ORAL_TABLET | Freq: Every day | ORAL | 11 refills | Status: DC

## 2019-05-15 NOTE — Progress Notes (Signed)
Subjective:    Latoya Stark is a 24 y.o. female who presents for contraception counseling. The patient has no complaints today. The patient is sexually active. Pertinent past medical history: migraines.  The information documented in the HPI was reviewed and verified.  Menstrual History: OB History    Gravida  5   Para  1   Term  1   Preterm      AB  4   Living  1     SAB  4   TAB      Ectopic      Multiple      Live Births  1            No LMP recorded. Patient has had an injection.   Patient Active Problem List   Diagnosis Date Noted  . [redacted] weeks gestation of pregnancy 03/01/2019  . Seizure disorder during pregnancy, antepartum, third trimester ([place]) 02/28/2019  . BMI 50.0-59.9, adult ([place]) 02/20/2019  . Conversion disorder 02/10/2019  . 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 Seizure disorder in pregnancy ([place]) 11/23/2018  . UTI (urinary tract infection) in pregnancy in first trimester 08/04/2018  . 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[place]) 08/01/2018  . Chronic migraine 08/01/2018  . Maternal obesity affecting pregnancy, antepartum 07/30/2018  . History of seizure 07/30/2018  . Supervision of high risk pregnancy, antepartum 03/19/2017   Past Medical History:  Diagnosis Date  . ADHD (attention deficit hyperactivity disorder)   . Anemia   . Anxiety   . Asthma   . Chronic migraine 08/01/2018  . Depression   . Gonorrhea in female 10/05/2013  . 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[place]) 08/01/2018  . Headache   . Obesity   . Seizures ([place])    "when I get too hot"  . Trichomonas infection   . UTI (lower urinary tract infection)     Past Surgical History:  Procedure Laterality Date  . ADENOIDECTOMY    . DILATION AND EVACUATION N/A 08/09/2013   Procedure: DILATATION AND EVACUATION;  Surgeon: Frederico Hamman, MD;  Location: [facility];  Service: Gynecology;  Laterality: N/A;  . ORTHOPEDIC SURGERY Left    left hand  . TONSILLECTOMY       Current Outpatient Medications:   .  acetaminophen (TYLENOL) 500 MG tablet, Take 1,000 mg by mouth every 6 (six) hours as needed for headache., Disp: , Rfl:  .  albuterol (PROVENTIL HFA;VENTOLIN HFA) 108 (90 Base) MCG/ACT inhaler, Inhale 2 puffs into the lungs every 6 (six) hours as needed for wheezing or shortness of breath., Disp: 1 Inhaler, Rfl: 0 .  Blood Pressure Monitoring (BLOOD PRESSURE CUFF) MISC, 1 Device by Does not apply route once a week., Disp: 1 each, Rfl: 0 .  ferrous sulfate 325 (65 FE) MG tablet, Take 1 tablet (325 mg total) by mouth 2 (two) times daily with a meal., Disp: 60 tablet, Rfl: 5 .  ibuprofen (ADVIL) 600 MG tablet, Take 1 tablet (600 mg total) by mouth every 6 (six) hours., Disp: 30 tablet, Rfl: 0 .  medroxyPROGESTERone (DEPO-PROVERA) 150 MG/ML injection, Inject 1 mL (150 mg total) into the muscle every 3 (three) months., Disp: 1 mL, Rfl: 3 .  pantoprazole (PROTONIX) 40 MG tablet, Take 1 tablet (40 mg total) by mouth daily., Disp: 30 tablet, Rfl: 1 .  polyethylene glycol (MIRALAX / GLYCOLAX) 17 g packet, Take 17 g by mouth 2 (two) times daily., Disp: 24 each, Rfl: 3 .  Prenat w/o A-FeCbn-Meth-FA-DHA (PRENATE MINI) 29-0.6-0.4-350  MG CAPS, Take 1 capsule by mouth daily before breakfast., Disp: 30 capsule, Rfl: 11 .  zolpidem (AMBIEN) 5 MG tablet, Take 1 tablet (5 mg total) by mouth at bedtime as needed for sleep., Disp: 15 tablet, Rfl: 1 .  levETIRAcetam (KEPPRA) 750 MG tablet, Take 2 tablets (1,500 mg total) by mouth 2 (two) times daily., Disp: 120 tablet, Rfl: 2 .  Levonorgest-Eth Estrad-Fe Bisg (BALCOLTRA) 0.1-20 MG-MCG(21) TABS, Take 1 tablet by mouth daily., Disp: 28 tablet, Rfl: 11 Allergies  Allergen Reactions  . Milk-Related Compounds Dermatitis    Breaks face out *can have 2% milk*  . Mushroom Extract Complex Anaphylaxis  . Penicillins Anaphylaxis    Has patient had a PCN reaction causing immediate rash, facial/tongue/throat swelling, SOB or lightheadedness with hypotension: Yes Has patient  had a PCN reaction causing severe rash involving mucus membranes or skin necrosis: No Has patient had a PCN reaction that required hospitalization No Has patient had a PCN reaction occurring within the last 10 years: Yes If all of the above answers are "NO", then may proceed with Cephalosporin use.   [name] Other (See Comments)    Breaks face out  . Latex Itching and Swelling  . Sulfa Antibiotics Swelling  . Vicodin [Hydrocodone-Acetaminophen] Hives    Social History   Tobacco Use  . Smoking status: Never Smoker  . Smokeless tobacco: Never Used  Substance Use Topics  . Alcohol use: No    Alcohol/week: 0.0 standard drinks    Family History  Problem Relation Age of Onset  . Seizures Mother   . Stroke Mother   . Hypertension Father   . Diabetes Maternal Grandmother   . Colon cancer Maternal Grandmother   . Hypertension Maternal Grandmother   . Clotting disorder Maternal Grandmother   . Breast cancer Maternal Grandmother   . Stroke Maternal Grandmother   . Diabetes Paternal Grandmother   . Seizures Maternal Aunt        Review of Systems Constitutional: negative for weight loss Genitourinary:positive for abnormal menstrual bleeding after Depo Provera injection.  Negative for vaginal discharge   Objective:   BP 129/85   Pulse 94   Ht 5\' 4"  (1.626 m)   Wt (!) 304 lb 14.4 oz (138.3 kg)   Breastfeeding Yes   BMI 52.34 kg/m    General:   alert and no distress  Skin:   no rash or abnormalities  Lungs:   clear to auscultation bilaterally  Heart:   regular rate and rhythm, S1, S2 normal, no murmur, click, rub or gallop   The remainder of the physical exam deferred due to menstrual bleeding.   Lab Review Urine pregnancy test Labs reviewed yes Radiologic studies reviewed no  50% of 15 min visit spent on counseling and coordination of care.    Assessment:    24 y.o., continuing Depo-Provera injections, no contraindications.    Plan:   Balcoltra 28 ( 1 sample  pack ) dispensed for heavy bleeding  All questions answered. Contraception: Depo-Provera injections. Diagnosis explained in detail, including differential. Discussed healthy lifestyle modifications. Follow up in 1 month.   Meds ordered this encounter  Medications  . Levonorgest-Eth Estrad-Fe Bisg (BALCOLTRA) 0.1-20 MG-MCG(21) TABS    Sig: Take 1 tablet by mouth daily.    Dispense:  28 tablet    Refill:  11    Shelly Bombard, MD 05/15/2019 10:56 AM

## 2019-05-15 NOTE — Progress Notes (Signed)
Patient presents for having abnormal bleeding since she started her depo inj. She states that the bleedings started a week after she got her depo inj. Her first depo injection was 04/10/19.

## 2019-05-19 ENCOUNTER — Encounter: Payer: Self-pay | Admitting: Family Medicine

## 2019-05-19 ENCOUNTER — Ambulatory Visit: Payer: Medicaid Other | Admitting: Family Medicine

## 2019-06-02 ENCOUNTER — Ambulatory Visit: Payer: Medicaid Other | Attending: Internal Medicine

## 2019-06-09 ENCOUNTER — Other Ambulatory Visit: Payer: Self-pay

## 2019-06-09 ENCOUNTER — Ambulatory Visit (HOSPITAL_COMMUNITY)
Admission: EM | Admit: 2019-06-09 | Discharge: 2019-06-09 | Disposition: A | Discharge status: Home / Self Care | Payer: Medicaid Other | Attending: Family Medicine | Admitting: Family Medicine

## 2019-06-09 ENCOUNTER — Encounter (HOSPITAL_COMMUNITY): Payer: Self-pay

## 2019-06-09 DIAGNOSIS — U071 COVID-19: Secondary | ICD-10-CM | POA: Diagnosis not present

## 2019-06-09 DIAGNOSIS — Z20822 Contact with and (suspected) exposure to covid-19: Secondary | ICD-10-CM | POA: Diagnosis not present

## 2019-06-09 NOTE — ED Provider Notes (Signed)
[facility]    CSN: [id number] Arrival date & time: 06/09/19  1040      History   Chief Complaint Chief Complaint  Patient presents with  . Covid Exposure    HPI Latoya Stark is a 24 y.o. female.   Patient is a 24 year old female that presents today for Covid exposure.  Reporting she was close exposure to her husband that tested positive for Covid yesterday.  She is currently asymptomatic     Past Medical History:  Diagnosis Date  . ADHD (attention deficit hyperactivity disorder)   . Anemia   . Anxiety   . Asthma   . Chronic migraine 08/01/2018  . Depression   . Gonorrhea in female 10/05/2013  . 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[place]) 08/01/2018  . Headache   . Obesity   . Seizures ([place])    "when I get too hot"  . Trichomonas infection   . UTI (lower urinary tract infection)     Patient Active Problem List   Diagnosis Date Noted  . [redacted] weeks gestation of pregnancy 03/01/2019  . Seizure disorder during pregnancy, antepartum, third trimester ([place]) 02/28/2019  . BMI 50.0-59.9, adult ([place]) 02/20/2019  . Conversion disorder 02/10/2019  . 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 Seizure disorder in pregnancy ([place]) 11/23/2018  . UTI (urinary tract infection) in pregnancy in first trimester 08/04/2018  . 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) 08/01/2018  . Chronic migraine 08/01/2018  . Maternal obesity affecting pregnancy, antepartum 07/30/2018  . History of seizure 07/30/2018  . Supervision of high risk pregnancy, antepartum 03/19/2017    Past Surgical History:  Procedure Laterality Date  . ADENOIDECTOMY    . DILATION AND EVACUATION N/A 08/09/2013   Procedure: DILATATION AND EVACUATION;  Surgeon: Frederico Hamman, MD;  Location: [facility];  Service: Gynecology;  Laterality: N/A;  . ORTHOPEDIC SURGERY Left    left hand  . TONSILLECTOMY      OB History    Gravida  5   Para  1   Term  1   Preterm      AB  4   Living  1     SAB  4   TAB      Ectopic        Multiple      Live Births  1            Home Medications    Prior to Admission medications   Medication Sig Start Date End Date Taking? Authorizing Provider  acetaminophen (TYLENOL) 500 MG tablet Take 1,000 mg by mouth every 6 (six) hours as needed for headache.    [provider]  albuterol (PROVENTIL HFA;VENTOLIN HFA) 108 (90 Base) MCG/ACT inhaler Inhale 2 puffs into the lungs every 6 (six) hours as needed for wheezing or shortness of breath. 08/20/17   Laban Emperor, PA-C  Blood Pressure Monitoring (BLOOD PRESSURE CUFF) MISC 1 Device by Does not apply route once a week. 12/25/18   Sloan Leiter, MD  ferrous sulfate 325 (65 FE) MG tablet Take 1 tablet (325 mg total) by mouth 2 (two) times daily with a meal. 04/16/19   Shelly Bombard, MD  ibuprofen (ADVIL) 600 MG tablet Take 1 tablet (600 mg total) by mouth every 6 (six) hours. 03/03/19   Luvenia Redden, PA-C  levETIRAcetam (KEPPRA) 750 MG tablet Take 2 tablets (1,500 mg total) by mouth 2 (two) times daily. 02/19/19 03/21/19  Debbora Presto, NP  Levonorgest-Eth Estrad-Fe Bisg (BALCOLTRA) 0.1-20  MG-MCG(21) TABS Take 1 tablet by mouth daily. 05/15/19   Shelly Bombard, MD  medroxyPROGESTERone (DEPO-PROVERA) 150 MG/ML injection Inject 1 mL (150 mg total) into the muscle every 3 (three) months. 04/10/19   Shelly Bombard, MD  pantoprazole (PROTONIX) 40 MG tablet Take 1 tablet (40 mg total) by mouth daily. 12/11/18 12/11/19  Constant, Peggy, MD  polyethylene glycol (MIRALAX / GLYCOLAX) 17 g packet Take 17 g by mouth 2 (two) times daily. 11/11/18   FairMarin Shutter, MD  Prenat w/o A-FeCbn-Meth-FA-DHA (PRENATE MINI) 29-0.6-0.4-350 MG CAPS Take 1 capsule by mouth daily before breakfast. 04/16/19   Shelly Bombard, MD  zolpidem (AMBIEN) 5 MG tablet Take 1 tablet (5 mg total) by mouth at bedtime as needed for sleep. 04/16/19   Shelly Bombard, MD    Family History Family History  Problem Relation Age of Onset  . Seizures Mother   .  Stroke Mother   . Hypertension Father   . Diabetes Maternal Grandmother   . Colon cancer Maternal Grandmother   . Hypertension Maternal Grandmother   . Clotting disorder Maternal Grandmother   . Breast cancer Maternal Grandmother   . Stroke Maternal Grandmother   . Diabetes Paternal Grandmother   . Seizures Maternal Aunt     Social History Social History   Tobacco Use  . Smoking status: Never Smoker  . Smokeless tobacco: Never Used  Substance Use Topics  . Alcohol use: No    Alcohol/week: 0.0 standard drinks  . Drug use: No     Allergies   Milk-related compounds, Mushroom extract complex, Penicillins, Cheese, Latex, Sulfa antibiotics, and Vicodin [hydrocodone-acetaminophen]   Review of Systems Review of Systems   Physical Exam Triage Vital Signs ED Triage Vitals  Enc Vitals Group     BP 06/09/19 1231 (!) 104/56     Pulse Rate 06/09/19 1231 80     Resp 06/09/19 1231 18     Temp 06/09/19 1231 99.1 F (37.3 C)     Temp Source 06/09/19 1231 Oral     SpO2 06/09/19 1231 100 %     Weight --      Height --      Head Circumference --      Peak Flow --      Pain Score 06/09/19 1230 0     Pain Loc --      Pain Edu? --      Excl. in [place]? --    No data found.  Updated Vital Signs BP (!) 104/56 (BP Location: Right Arm)   Pulse 80   Temp 99.1 F (37.3 C) (Oral)   Resp 18   LMP  (Within Months) Comment: 1 month  SpO2 100%   Visual Acuity Right Eye Distance:   Left Eye Distance:   Bilateral Distance:    Right Eye Near:   Left Eye Near:    Bilateral Near:     Physical Exam Vitals and nursing note reviewed.  Constitutional:      General: She is not in acute distress.    Appearance: Normal appearance. She is obese. She is not ill-appearing, toxic-appearing or diaphoretic.  HENT:     Head: Normocephalic.     Nose: Nose normal.  Eyes:     Conjunctiva/sclera: Conjunctivae normal.  Pulmonary:     Effort: Pulmonary effort is normal.  Musculoskeletal:         General: Normal range of motion.     Cervical back: Normal range of motion.  Skin:    General: Skin is warm and dry.     Findings: No rash.  Neurological:     Mental Status: She is alert.  Psychiatric:        Mood and Affect: Mood normal.      UC Treatments / Results  Labs (all labs ordered are listed, but only abnormal results are displayed) Labs Reviewed  SARS CORONAVIRUS 2 (TAT 6-24 HRS)    EKG   Radiology No results found.  Procedures Procedures (including critical care time)  Medications Ordered in UC Medications - No data to display  Initial Impression / Assessment and Plan / UC Course  I have reviewed the triage vital signs and the nursing notes.  Pertinent labs & imaging results that were available during my care of the patient were reviewed by me and considered in my medical decision making (see chart for details).     Covid testing based on exposure Covid swab pending Patient asymptomatic Final Clinical Impressions(s) / UC Diagnoses   Final diagnoses:  Close exposure to COVID-19 virus     Discharge Instructions     Your Covid swab is pending and we should have the results tomorrow.  You can check your MyChart for results    ED Prescriptions    None     PDMP not reviewed this encounter.   Orvan July, NP 06/09/19 1317

## 2019-06-09 NOTE — Discharge Instructions (Addendum)
Your Covid swab is pending and we should have the results tomorrow.  You can check your MyChart for results

## 2019-06-09 NOTE — ED Triage Notes (Signed)
Pt presents to UC for COVID testing, after being exposed to her husband who tested positive for COVID yesterday. Pt denies any symptoms.

## 2019-06-10 ENCOUNTER — Ambulatory Visit (INDEPENDENT_AMBULATORY_CARE_PROVIDER_SITE_OTHER): Payer: Medicaid Other | Admitting: Licensed Clinical Social Worker

## 2019-06-10 DIAGNOSIS — O99345 Other mental disorders complicating the puerperium: Secondary | ICD-10-CM

## 2019-06-10 DIAGNOSIS — F53 Postpartum depression: Secondary | ICD-10-CM | POA: Diagnosis not present

## 2019-06-10 LAB — SARS CORONAVIRUS 2 (TAT 6-24 HRS): SARS Coronavirus 2: POSITIVE — AB

## 2019-06-10 NOTE — BH Specialist Note (Signed)
Integrated Behavioral Health via Telemedicine Video Visit  06/10/2019 Latoya Stark [id number]  Number of [place] visits: 4 Session Start time: 2:15pm  Session End time: 2:55 Total time: 40 mins via mychart   Referring Provider: J. Roselie Awkward MD Type of Visit: Video Patient/Family location: Home  [facility] Provider location: [place]  All persons participating in visit: D.[place] and LCSWA A. Teleshia Lemere   Confirmed patient's address: yes Confirmed patient's phone number: yes Any changes to demographics: no  Confirmed patient's insurance: no Any changes to patient's insurance: no  Discussed confidentiality: Yes   I connected with [place] and/or [place] by a video enabled telemedicine application and verified that I am speaking with the correct person using two identifiers.     I discussed the limitations of evaluation and management by telemedicine and the availability of in person appointments.  I discussed that the purpose of this visit is to provide behavioral health care while limiting exposure to the novel coronavirus.   Discussed there is a possibility of technology failure and discussed alternative modes of communication if that failure occurs.  I discussed that engaging in this video visit, they consent to the provision of behavioral healthcare and the services will be billed under their insurance.  Patient and/or legal guardian expressed understanding and consented to video visit: yes   PRESENTING CONCERNS: Patient and/or family reports the following symptoms/concerns: worry, covid 19 dx, sleep deprivation  Duration of problem:post partum; Severity of problem: mild   STRENGTHS (Protective Factors/Coping Skills): Seeking assistance with medical providers   GOALS ADDRESSED: Patient will: 1.  Reduce symptoms of:  Post partum depression  2.  Increase knowledge and/or ability of:   3.  Demonstrate ability to: self manage symptoms    INTERVENTIONS: Interventions utilized:  Supportive counseling and goal setting  Standardized Assessments completed: edinburgh  Edinburgh Postnatal Depression Scale - 06/10/19 1424      Edinburgh Postnatal Depression Scale:  In the Past 7 Days   I have been able to laugh and see the funny side of things.  0    I have looked forward with enjoyment to things.  1    I have blamed myself unnecessarily when things went wrong.  1    I have been anxious or worried for no good reason.  3    I have felt scared or panicky for no good reason.  3   due to covid 19 dx per patient   Things have been getting on top of me.  1    I have been so unhappy that I have had difficulty sleeping.  0    I have felt sad or miserable.  0    I have been so unhappy that I have been crying.  1    The thought of harming myself has occurred to me.  0    Edinburgh Postnatal Depression Scale Total  10       ASSESSMENT: Patient currently experiencing post partum depression.   Patient may benefit from integrated behavioral health   PLAN: 1. Follow up with behavioral health clinician on : 07/01/2019 2. Behavioral recommendations: set attainable goals using smart method, continue with mindfulness techniques and try 1-2 nights of sleep without medication  3. Referral(s): none   I discussed the assessment and treatment plan with the patient and/or parent/guardian. They were provided an opportunity to ask questions and all were answered. They agreed with the plan and demonstrated an understanding of the instructions.  They were advised to call back or seek an in-person evaluation if the symptoms worsen or if the condition fails to improve as anticipated.  Lynnea Ferrier

## 2019-06-11 ENCOUNTER — Telehealth: Payer: Self-pay | Admitting: Unknown Physician Specialty

## 2019-06-11 ENCOUNTER — Other Ambulatory Visit: Payer: Self-pay | Admitting: Unknown Physician Specialty

## 2019-06-11 ENCOUNTER — Ambulatory Visit (HOSPITAL_COMMUNITY)
Admission: RE | Admit: 2019-06-11 | Discharge: 2019-06-11 | Disposition: A | Discharge status: Home / Self Care | Payer: Medicaid Other | Source: Ambulatory Visit | Attending: Pulmonary Disease | Admitting: Pulmonary Disease

## 2019-06-11 DIAGNOSIS — Z6841 Body Mass Index (BMI) 40.0 and over, adult: Secondary | ICD-10-CM

## 2019-06-11 DIAGNOSIS — U071 COVID-19: Secondary | ICD-10-CM

## 2019-06-11 MED ORDER — SODIUM CHLORIDE 0.9 % IV SOLN: INTRAVENOUS | Status: DC | PRN

## 2019-06-11 MED ORDER — METHYLPREDNISOLONE SODIUM SUCC 125 MG IJ SOLR: 125.0000 mg | Freq: Once | INTRAMUSCULAR | Status: DC | PRN

## 2019-06-11 MED ORDER — ALBUTEROL SULFATE HFA 108 (90 BASE) MCG/ACT IN AERS: 2.0000 | INHALATION_SPRAY | Freq: Once | RESPIRATORY_TRACT | Status: DC | PRN

## 2019-06-11 MED ORDER — FAMOTIDINE IN NACL 20-0.9 MG/50ML-% IV SOLN: 20.0000 mg | Freq: Once | INTRAVENOUS | Status: DC | PRN

## 2019-06-11 MED ORDER — EPINEPHRINE 0.3 MG/0.3ML IJ SOAJ: 0.3000 mg | Freq: Once | INTRAMUSCULAR | Status: DC | PRN

## 2019-06-11 MED ORDER — DIPHENHYDRAMINE HCL 50 MG/ML IJ SOLN: 50.0000 mg | Freq: Once | INTRAMUSCULAR | Status: DC | PRN

## 2019-06-11 MED ORDER — SODIUM CHLORIDE 0.9 % IV SOLN
Freq: Once | INTRAVENOUS | Status: AC
  Filled 2019-06-11: qty 700

## 2019-06-11 NOTE — Telephone Encounter (Signed)
  I connected by phone with Latoya Stark on 06/11/2019 at 10:57 AM to discuss the potential use of an new treatment for mild to moderate COVID-19 viral infection in non-hospitalized patients.  This patient is a 24 y.o. female that meets the FDA criteria for Emergency Use Authorization of bamlanivimab/etesevimab or casirivimab/imdevimab.  Has a (+) direct SARS-CoV-2 viral test result  Has mild or moderate COVID-19   Is NOT hospitalized due to COVID-19  Is within 10 days of symptom onset  Has at least one of the high risk factor(s) for progression to severe COVID-19 and/or hospitalization as defined in EUA.  Specific high risk criteria : BMI > 25   I have spoken and communicated the following to the patient or parent/caregiver:  1. FDA has authorized the emergency use of bamlanivimab/etesevimab and casirivimab\imdevimab for the treatment of mild to moderate COVID-19 in adults and pediatric patients with positive results of direct SARS-CoV-2 viral testing who are 50 years of age and older weighing at least 40 kg, and who are at high risk for progressing to severe COVID-19 and/or hospitalization.  2. The significant known and potential risks and benefits of bamlanivimab/etesevimab and casirivimab\imdevimab, and the extent to which such potential risks and benefits are unknown.  3. Information on available alternative treatments and the risks and benefits of those alternatives, including clinical trials.  4. Patients treated with bamlanivimab/etesevimab and casirivimab\imdevimab should continue to self-isolate and use infection control measures (e.g., wear mask, isolate, social distance, avoid sharing personal items, clean and disinfect "high touch" surfaces, and frequent handwashing) according to CDC guidelines.   5. The patient or parent/caregiver has the option to accept or refuse bamlanivimab/etesevimab or casirivimab\imdevimab .  After reviewing this information with the patient, The  patient agreed to proceed with receiving the bamlanimivab infusion and will be provided a copy of the Fact sheet prior to receiving the infusion.Kathrine Haddock 06/11/2019 10:57 AM  Day 7 or 8 of illness

## 2019-06-11 NOTE — Progress Notes (Signed)
  Diagnosis: COVID-19  Physician: Dr. Joya Gaskins   Procedure: Covid Infusion Clinic Med: bamlanivimab\etesevimab infusion - Provided patient with bamlanimivab\etesevimab fact sheet for patients, parents and caregivers prior to infusion.  Complications: No immediate complications noted.  Discharge: Discharged home   Latoya Stark 06/11/2019

## 2019-06-11 NOTE — Discharge Instructions (Signed)

## 2019-06-11 NOTE — Telephone Encounter (Signed)
Called to discuss with patient about Covid symptoms and the use of bamlanivimab, a monoclonal antibody infusion for those with mild to moderate Covid symptoms and at a high risk of hospitalization.  Pt is qualified for this infusion at the [facility] infusion center due to [employer] left to call back

## 2019-06-16 ENCOUNTER — Ambulatory Visit: Payer: Medicaid Other | Admitting: Obstetrics

## 2019-07-01 ENCOUNTER — Ambulatory Visit: Payer: Medicaid Other | Admitting: Licensed Clinical Social Worker

## 2019-07-01 ENCOUNTER — Ambulatory Visit: Payer: Medicaid Other

## 2019-07-28 ENCOUNTER — Telehealth: Payer: Self-pay

## 2019-07-28 ENCOUNTER — Encounter: Payer: Self-pay | Admitting: Adult Health

## 2019-07-28 ENCOUNTER — Ambulatory Visit: Payer: Medicaid Other | Admitting: Adult Health

## 2019-07-28 NOTE — Telephone Encounter (Signed)
LMVM forpt to return call to get rescheduled for missed appt today.  I LM mobile.

## 2019-07-28 NOTE — Telephone Encounter (Signed)
Pt's sister called office and left VM to inform us that pt had a seizure this morning and therefore missed her appt. She is asking for a call to r/s pt's appt.

## 2019-07-29 NOTE — Telephone Encounter (Signed)
I called pt and she has had 4 sz per husband, this month.  She feels like heat related.  She has been taking her keppra 1500mg  po bid.  Last sz she was lying in bed, air on, had headache (taking tylenol, motrin.  Not pregnant, takes prenatal and Fe for low Fe.  Made appt 07-31-19 at 1300 arrive 1230.

## 2019-07-29 NOTE — Telephone Encounter (Signed)
Pt left a VM asking for a call back to schedule her appt.

## 2019-07-31 ENCOUNTER — Encounter: Payer: Self-pay | Admitting: Family Medicine

## 2019-07-31 ENCOUNTER — Ambulatory Visit: Payer: Medicaid Other | Admitting: Family Medicine

## 2019-07-31 VITALS — BP 139/89 | HR 101 | Ht 63.0 in | Wt 312.0 lb

## 2019-07-31 DIAGNOSIS — G43709 Chronic migraine without aura, not intractable, without status migrainosus: Secondary | ICD-10-CM

## 2019-07-31 DIAGNOSIS — G40409 Other generalized epilepsy and epileptic syndromes, not intractable, without status epilepticus: Secondary | ICD-10-CM | POA: Diagnosis not present

## 2019-07-31 DIAGNOSIS — IMO0002 Reserved for concepts with insufficient information to code with codable children: Secondary | ICD-10-CM

## 2019-07-31 MED ORDER — LEVETIRACETAM 750 MG PO TABS: 1500.0000 mg | ORAL_TABLET | Freq: Two times a day (BID) | ORAL | 0 refills | Status: DC

## 2019-07-31 NOTE — Patient Instructions (Signed)
I need you to take your medication every day. Take two tablets twice daily every day.   Stay well hydrated and exercise regularly  Follow up in 3 months    According to [place] law, you can not drive unless you are seizure / syncope free for at least 6 months and under physician's care.  Please maintain precautions. Do not participate in activities where a loss of awareness could harm you or someone else. No swimming alone, no tub bathing, no hot tubs, no driving, no operating motorized vehicles (cars, ATVs, motocycles, etc), lawnmowers, power tools or firearms. No standing at heights, such as rooftops, ladders or stairs. Avoid hot objects such as stoves, heaters, open fires. Wear a helmet when riding a bicycle, scooter, skateboard, etc. and avoid areas of traffic. Set your water heater to 120 degrees or less.    Seizure, Adult A seizure is a sudden burst of abnormal electrical activity in the brain. Seizures usually last from 30 seconds to 2 minutes. They can cause many different symptoms. Usually, seizures are not harmful unless they last a long time. What are the causes? Common causes of this condition include:  Fever or infection.  Conditions that affect the brain, such as: ? A brain abnormality that you were born with. ? A brain or head injury. ? Bleeding in the brain. ? A tumor. ? Stroke. ? Brain disorders such as autism or cerebral palsy.  Low blood sugar.  Conditions that are passed from parent to child (are inherited).  Problems with substances, such as: ? Having a reaction to a drug or a medicine. ? Suddenly stopping the use of a substance (withdrawal). In some cases, the cause may not be known. A person who has repeated seizures over time without a clear cause has a condition called epilepsy. What increases the risk? You are more likely to get this condition if you have:  A family history of epilepsy.  Had a seizure in the past.  A brain disorder.  A history of  head injury, lack of oxygen at birth, or strokes. What are the signs or symptoms? There are many types of seizures. The symptoms vary depending on the type of seizure you have. Examples of symptoms during a seizure include:  Shaking (convulsions).  Stiffness in the body.  Passing out (losing consciousness).  Head nodding.  Staring.  Not responding to sound or touch.  Loss of bladder control and bowel control. Some people have symptoms right before and right after a seizure happens. Symptoms before a seizure may include:  Fear.  Worry (anxiety).  Feeling like you may vomit (nauseous).  Feeling like the room is spinning (vertigo).  Feeling like you saw or heard something before (dj vu).  Odd tastes or smells.  Changes in how you see. You may see flashing lights or spots. Symptoms after a seizure happens can include:  Confusion.  Sleepiness.  Headache.  Weakness on one side of the body. How is this treated? Most seizures will stop on their own in under 5 minutes. In these cases, no treatment is needed. Seizures that last longer than 5 minutes will usually need treatment. Treatment can include:  Medicines given through an IV tube.  Avoiding things that are known to cause your seizures. These can include medicines that you take for another condition.  Medicines to treat epilepsy.  Surgery to stop the seizures. This may be needed if medicines do not help. Follow these instructions at home: Medicines  Take over-the-counter and  prescription medicines only as told by your doctor.  Do not eat or drink anything that may keep your medicine from working, such as alcohol. Activity  Do not do any activities that would be dangerous if you had another seizure, like driving or swimming. Wait until your doctor says it is safe for you to do them.  If you live in the [place] ask your local DMV (department of motor vehicles) when you can drive.  Get plenty of rest. Teaching  others Teach friends and family what to do when you have a seizure. They should:  Lay you on the ground.  Protect your head and body.  Loosen any tight clothing around your neck.  Turn you on your side.  Not hold you down.  Not put anything into your mouth.  Know whether or not you need emergency care.  Stay with you until you are better.  General instructions  Contact your doctor each time you have a seizure.  Avoid anything that gives you seizures.  Keep a seizure diary. Write down: ? What you think caused each seizure. ? What you remember about each seizure.  Keep all follow-up visits as told by your doctor. This is important. Contact a doctor if:  You have another seizure.  You have seizures more often.  There is any change in what happens during your seizures.  You keep having seizures with treatment.  You have symptoms of being sick or having an infection. Get help right away if:  You have a seizure that: ? Lasts longer than 5 minutes. ? Is different than seizures you had before. ? Makes it harder to breathe. ? Happens after you hurt your head.  You have any of these symptoms after a seizure: ? Not being able to speak. ? Not being able to use a part of your body. ? Confusion. ? A bad headache.  You have two or more seizures in a row.  You do not wake up right after a seizure.  You get hurt during a seizure. These symptoms may be an emergency. Do not wait to see if the symptoms will go away. Get medical help right away. Call your local emergency services (911 in the [place] Do not drive yourself to the hospital. Summary  Seizures usually last from 30 seconds to 2 minutes. Usually, they are not harmful unless they last a long time.  Do not eat or drink anything that may keep your medicine from working, such as alcohol.  Teach friends and family what to do when you have a seizure.  Contact your doctor each time you have a seizure. This  information is not intended to replace advice given to you by your health care provider. Make sure you discuss any questions you have with your health care provider. Document Revised: 03/15/2018 Document Reviewed: 03/15/2018 Elsevier Patient Education  [place].

## 2019-07-31 NOTE — Progress Notes (Signed)
I have read the note, and I agree with the clinical assessment and plan.  Damarius Karnes A. Kaiyla Stahly, MD, PhD, FAAN Certified in Neurology, Clinical Neurophysiology, Sleep Medicine, Pain Medicine and Neuroimaging  [facility] [address], [address] [place], [place] [address] [phone]  

## 2019-07-31 NOTE — Progress Notes (Signed)
PATIENT: Latoya Stark DOB: 03/09/1995  REASON FOR VISIT: follow up HISTORY FROM: patient  Chief Complaint  Patient presents with   Follow-up    rm 1 here for a f/u on Grand Mal seizures.     HISTORY OF PRESENT ILLNESS: Today 07/31/19 Latoya Stark is a 24 y.o. female here today for follow up for seizures. Her husband reports that she had a seizure two days ago. He reports that she called her employer and while talking she "started shaking". He reports that her whole body was shaked. Symptoms lasted for about 2 minutes. Following the seizure she was completely back to baseline. He states that there was another similar event in May. These two events are the only "seizures" she has had. Her husband reports that she is not taking medications consistently. She admits that she had missed some doses of AED in May but insists that she has taken it consistently since. Medication was last filled for 1 month and 2 refills in February. Her husband states that there is a full bottle in her car and she reports that she has 1 more refill at the pharmacy.   She reports that she has had terrible headaches over the past 3-4 months. She is working from home now. She reports seeing her PCP but no treatment was started. She is seeing OB for post partum depression.   She delivered a healthy baby boy 5 months ago.   HISTORY: (copied from my note on 02/19/2019)  Latoya Stark is a 24 y.o. female here today for follow up for seizure. She was recently hospitalized for breakthrough seizure after admittedly missing two doses of her levetiracetam. She complained of right leg paralysis following seizure. Imaging was reassuring and she was cleared by neurology. Patient had positive Hoover's sign and concerns of give away weakness on exam. 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 She is doing fairly well. She is participating in OT. She is doing exercises at home. She reports that she has  feeling in her leg now. She is able to walk with a walker. She was last seen by OB yesterday.   She plans to deliver her baby boy, Latoya Stark, at [facility] She lives with her boyfriend. He works nights. He has two children with his ex.    HISTORY: (copied from my note on 11/18/2018)  Latoya L Smithis a 24 y.o.femalehere today for follow up for seizures. She is currently [redacted] weeks pregnant. She continues levetiracetam 750mg  TID.She reports that she has had 2 seizures since last being seen in July. Her fianc is here with her today and reports that her last seizure was November 2. She was sitting on the toilet and noted feeling hot and dizzy. He reports that she had convulsions for approximately 2 minutes. He states that she did urinate in the toilet while having convulsions. He feels that the past 2 seizures have been much shorter and less severe than her previous seizures. She continues to have migraines but feels that these have improved significantly. She is tolerating levetiracetam well with no obvious adverse effects. She is followed closely by OB. Last appointment was October 23. She is scheduled for follow-up again on November 23.  HISTORY: (copied fromDr Sater'snote on 08/01/2018)  I had the pleasure of seeing your patient[facility] for neurologic consultation regarding her headaches and seizures.  She is a 24 year old womanwho has a long history of headaches and  seizures since age 89. She is currently 8 to [redacted] weeks pregnant.  She has had migraine headaches for yearsbut the frequency has increased over the past few months. Headaches are daily and located bilaterally, forehead >temporal region. She gets nausea, severe photophobia and phonophobia. Headaches usually improve if she sleeps them off. Heat triggers the headache. Tylenol sometimes stops the headaches. Topamax had helped her headaches with benefit.   She  had her first seizure at age 72. 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 Her last one was early July 2020. Her previous one had been a month or two earlier (while on Dilantin). She has been having one every 2-3 months, even while on treatment. She stopped Dilantin 4 weeks ago (10 days or so before the last seizure) due to pregnancy. She is currently 8-[redacted] weeks pregnant.   She has not tried other medications besides Dilantin.She has seen Dr. Gaynell Face in the past.  REVIEW OF SYSTEMS: Out of a complete 14 system review of symptoms, the patient complains only of the following symptoms, headaches, seizures and all other reviewed systems are negative.  ALLERGIES: Allergies  Allergen Reactions   Milk-Related Compounds Dermatitis    Breaks face out *can have 2% milk*   Mushroom Extract Complex Anaphylaxis   Penicillins Anaphylaxis    Has patient had a PCN reaction causing immediate rash, facial/tongue/throat swelling, SOB or lightheadedness with hypotension: Yes Has patient had a PCN reaction causing severe rash involving mucus membranes or skin necrosis: No Has patient had a PCN reaction that required hospitalization No Has patient had a PCN reaction occurring within the last 10 years: Yes If all of the above answers are "NO", then may proceed with Cephalosporin use.    Cheese Other (See Comments)    Breaks face out   Latex Itching and Swelling   Sulfa Antibiotics Swelling   Vicodin [Hydrocodone-Acetaminophen] Hives    HOME MEDICATIONS: Outpatient Medications Prior to Visit  Medication Sig Dispense Refill   acetaminophen (TYLENOL) 500 MG tablet Take 1,000 mg by mouth every 6 (six) hours as needed for headache.     albuterol (PROVENTIL HFA;VENTOLIN HFA) 108 (90 Base) MCG/ACT inhaler Inhale 2 puffs into the lungs every 6 (six) hours as needed for wheezing or shortness of breath. 1 Inhaler 0   Blood  Pressure Monitoring (BLOOD PRESSURE CUFF) MISC 1 Device by Does not apply route once a week. 1 each 0   ferrous sulfate 325 (65 FE) MG tablet Take 1 tablet (325 mg total) by mouth 2 (two) times daily with a meal. 60 tablet 5   ibuprofen (ADVIL) 600 MG tablet Take 1 tablet (600 mg total) by mouth every 6 (six) hours. 30 tablet 0   Levonorgest-Eth Estrad-Fe Bisg (BALCOLTRA) 0.1-20 MG-MCG(21) TABS Take 1 tablet by mouth daily. 28 tablet 11   medroxyPROGESTERone (DEPO-PROVERA) 150 MG/ML injection Inject 1 mL (150 mg total) into the muscle every 3 (three) months. 1 mL 3   pantoprazole (PROTONIX) 40 MG tablet Take 1 tablet (40 mg total) by mouth daily. 30 tablet 1   polyethylene glycol (MIRALAX / GLYCOLAX) 17 g packet Take 17 g by mouth 2 (two) times daily. 24 each 3   Prenat w/o A-FeCbn-Meth-FA-DHA (PRENATE MINI) 29-0.6-0.4-350 MG CAPS Take 1 capsule by mouth daily before breakfast. 30 capsule 11   zolpidem (AMBIEN) 5 MG tablet Take 1 tablet (5 mg total) by mouth at bedtime as needed for sleep. 15 tablet 1  levETIRAcetam (KEPPRA) 750 MG tablet Take 2 tablets (1,500 mg total) by mouth 2 (two) times daily. 120 tablet 2   No facility-administered medications prior to visit.    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   Headache    Obesity    Seizures ([place])    "when I get too hot"   Trichomonas infection    UTI (lower urinary tract infection)     PAST SURGICAL HISTORY: Past Surgical History:  Procedure Laterality Date   ADENOIDECTOMY     DILATION AND EVACUATION N/A 08/09/2013   Procedure: DILATATION AND EVACUATION;  Surgeon: Frederico Hamman, MD;  Location: [facility];  Service: Gynecology;  Laterality: N/A;   ORTHOPEDIC SURGERY Left    left hand    TONSILLECTOMY      FAMILY HISTORY: Family History  Problem Relation Age of Onset   Seizures Mother    Stroke Mother    Hypertension Father    Diabetes Maternal Grandmother    Colon cancer Maternal Grandmother    Hypertension Maternal Grandmother    Clotting disorder Maternal Grandmother    Breast cancer Maternal Grandmother    Stroke Maternal Grandmother    Diabetes Paternal Grandmother    Seizures Maternal Aunt     SOCIAL HISTORY: Social History   Socioeconomic History   Marital status: Single    Spouse name: Not on file   Number of children: 0   Years of education: Not on file   Highest education level: Not on file  Occupational History   Not on file  Tobacco Use   Smoking status: Never Smoker   Smokeless tobacco: Never Used  Scientific laboratory technician Use: Never used  Substance and Sexual Activity   Alcohol use: No    Alcohol/week: 0.0 standard drinks   Drug use: No   Sexual activity: Yes    Partners: Male    Birth control/protection: None, Injection    Comment: one partner  Other Topics Concern   Not on file  Social History Narrative   Caffeine: tea sometimes   Lives with fiance   Right handed    Social Determinants of Health   Financial Resource Strain: Low Risk    Difficulty of Paying Living Expenses: Not hard at all  Food Insecurity: Food Insecurity Present   Worried About Charity fundraiser in the Last Year: Sometimes true   Ran Out of Food in the Last Year: Sometimes true  Transportation Needs: No Transportation Needs   Lack of Transportation (Medical): No   Lack of Transportation (Non-Medical): No  Physical Activity: Unknown   Days of Exercise per Week: 0 days   Minutes of Exercise per Session: Not on file  Stress: No Stress Concern Present   Feeling of Stress : Not at all  Social Connections: Socially Isolated   Frequency of Communication with Friends and Family: More than three times a week   Frequency of  Social Gatherings with Friends and Family: More than three times a week   Attends Religious Services: Never   Marine scientist or Organizations: No   Attends Archivist Meetings: Never   Marital Status: Never married  Human resources officer Violence: Not At Risk   Fear  of Current or Ex-Partner: No   Emotionally Abused: No   Physically Abused: No   Sexually Abused: No      PHYSICAL EXAM  Vitals:   07/31/19 1246  BP: (!) 139/89  Pulse: 101  Weight: (!) 312 lb (141.5 kg)  Height: 5\' 3"  (1.6 m)   Body mass index is 55.27 kg/m.  Generalized: Well developed, in no acute distress  Cardiology: normal rate and rhythm, no murmur noted Respiratory: clear to auscultation bilaterally  Neurological examination  Mentation: Alert oriented to time, place, history taking. Follows all commands speech and language fluent Cranial nerve II-XII: Pupils were equal round reactive to light. Extraocular movements were full, visual field were full  Motor: The motor testing reveals 5 over 5 strength of all 4 extremities. Good symmetric motor tone is noted throughout.  Gait and station: Gait is normal.    DIAGNOSTIC DATA (LABS, IMAGING, TESTING) - I reviewed patient records, labs, notes, testing and imaging myself where available.  No flowsheet data found.   Lab Results  Component Value Date   WBC 9.6 03/03/2019   HGB 7.8 (L) 03/03/2019   HCT 25.2 (L) 03/03/2019   MCV 90.6 03/03/2019   PLT 263 03/03/2019      Component Value Date/Time   NA 136 02/20/2019 2329   NA 144 08/04/2015 1346   K 3.6 02/20/2019 2329   CL 104 02/20/2019 2329   CO2 22 02/20/2019 2329   GLUCOSE 101 (H) 02/20/2019 2329   BUN <5 (L) 02/20/2019 2329   BUN 8 08/04/2015 1346   CREATININE 0.50 02/20/2019 2329   CALCIUM 9.1 02/20/2019 2329   PROT 6.5 02/20/2019 2329   PROT 7.1 08/04/2015 1346   ALBUMIN 3.0 (L) 02/20/2019 2329   ALBUMIN 4.3 08/04/2015 1346   AST 25 02/20/2019 2329   ALT 23 02/20/2019  2329   ALKPHOS 183 (H) 02/20/2019 2329   BILITOT 0.4 02/20/2019 2329   BILITOT 0.3 08/04/2015 1346   GFRNONAA >60 02/20/2019 2329   GFRAA >60 02/20/2019 2329   Lab Results  Component Value Date   CHOL  12/13/2006    122        ATP III CLASSIFICATION:  <200     mg/dL   Desirable  200-239  mg/dL   Borderline High  >=240    mg/dL   High   HDL 30 (L) 12/13/2006   LDLCALC  12/13/2006    76        Total Cholesterol/HDL:CHD Risk Coronary Heart Disease Risk Table                     Men   Women  1/2 Average Risk   3.4   3.3   TRIG 78 12/13/2006   CHOLHDL 4.1 12/13/2006   Lab Results  Component Value Date   HGBA1C 5.6 07/30/2018   Lab Results  Component Value Date   VITAMINB12 309 02/24/2014   Lab Results  Component Value Date   TSH 1.770 03/15/2018     ASSESSMENT AND PLAN 24 y.o. 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 here with     ICD-10-CM   1. 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 Chronic migraine  G43.709  Cory reports having two breakthrough seizures over the past 5 months. One in May and last one was 7/20. She admits that she has not taken her medications as prescribed. She feels that she has been fairly consistent with dosing but is unclear for what time frame. Initially, she only admitted to missing one dose of medication but her husband reports that she frequently misses either the morning or evening dose. We have reviewed dates of last refills and she agrees that missed doses have been more frequent. I have explained the importance of taking levetiracetam 1500mg  twice daily every day to prevent breakthrough  seizures. I have also explained that this medication may help with headaches. She was encouraged to work on healthy lifestyle habits. Sleep hygiene encouraged. She is staying up late at night even though her baby is sleeping well. I have written 1 month prescription and patient reports she has two months at home. She will return to see me in 3 months.    No orders of the defined types were placed in this encounter.    Meds ordered this encounter  Medications   levETIRAcetam (KEPPRA) 750 MG tablet    Sig: Take 2 tablets (1,500 mg total) by mouth 2 (two) times daily.    Dispense:  120 tablet    Refill:  0    Order Specific Question:   Supervising Provider    Answer:   Melvenia Beam [id number]      I spent 15 minutes with the patient. 50% of this time was spent counseling and educating patient on plan of care and medications.    Debbora Presto, FNP-C 07/31/2019, 1:54 PM [facility] [address], [place], [place] [address] [phone]

## 2019-08-14 ENCOUNTER — Ambulatory Visit: Payer: Medicaid Other | Admitting: Obstetrics

## 2019-08-18 ENCOUNTER — Ambulatory Visit: Payer: Medicaid Other | Admitting: Obstetrics

## 2019-08-18 ENCOUNTER — Other Ambulatory Visit: Payer: Self-pay

## 2019-08-18 ENCOUNTER — Other Ambulatory Visit (HOSPITAL_COMMUNITY)
Admission: RE | Admit: 2019-08-18 | Discharge: 2019-08-18 | Disposition: A | Discharge status: Home / Self Care | Payer: Medicaid Other | Source: Ambulatory Visit | Attending: Obstetrics | Admitting: Obstetrics

## 2019-08-18 ENCOUNTER — Encounter: Payer: Self-pay | Admitting: Obstetrics

## 2019-08-18 VITALS — BP 133/85 | HR 97 | Ht 63.5 in | Wt 311.4 lb

## 2019-08-18 DIAGNOSIS — M549 Dorsalgia, unspecified: Secondary | ICD-10-CM | POA: Diagnosis not present

## 2019-08-18 DIAGNOSIS — N898 Other specified noninflammatory disorders of vagina: Secondary | ICD-10-CM | POA: Diagnosis not present

## 2019-08-18 DIAGNOSIS — Z6841 Body Mass Index (BMI) 40.0 and over, adult: Secondary | ICD-10-CM

## 2019-08-18 DIAGNOSIS — Z113 Encounter for screening for infections with a predominantly sexual mode of transmission: Secondary | ICD-10-CM | POA: Diagnosis not present

## 2019-08-18 MED ORDER — IBUPROFEN 800 MG PO TABS: 800.0000 mg | ORAL_TABLET | Freq: Three times a day (TID) | ORAL | 5 refills | Status: DC | PRN

## 2019-08-18 MED ORDER — CYCLOBENZAPRINE HCL 10 MG PO TABS: 10.0000 mg | ORAL_TABLET | Freq: Three times a day (TID) | ORAL | 1 refills | Status: DC | PRN

## 2019-08-18 NOTE — Progress Notes (Signed)
Patient presents for creamy vaginal discharge with no odor. She states that she does have some vaginal itching. She states that she recently switched soaps and symptoms started shortly after switching. Patient states that she wants everything checked. She is requesting STD's, bv and yeast.
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
## 2019-08-19 ENCOUNTER — Telehealth: Payer: Self-pay

## 2019-08-19 ENCOUNTER — Other Ambulatory Visit: Payer: Self-pay | Admitting: Obstetrics

## 2019-08-19 DIAGNOSIS — N760 Acute vaginitis: Secondary | ICD-10-CM

## 2019-08-19 DIAGNOSIS — B3731 Acute candidiasis of vulva and vagina: Secondary | ICD-10-CM

## 2019-08-19 LAB — CERVICOVAGINAL ANCILLARY ONLY
Bacterial Vaginitis (gardnerella): POSITIVE — AB
Candida Glabrata: NEGATIVE
Candida Vaginitis: POSITIVE — AB
Chlamydia: NEGATIVE
Comment: NEGATIVE
Comment: NEGATIVE
Comment: NEGATIVE
Comment: NEGATIVE
Comment: NEGATIVE
Comment: NORMAL
Neisseria Gonorrhea: NEGATIVE
Trichomonas: NEGATIVE

## 2019-08-19 LAB — HEPATITIS C ANTIBODY: Hep C Virus Ab: <0.1 s/co ratio (ref 0.0–0.9)

## 2019-08-19 LAB — HIV ANTIBODY (ROUTINE TESTING W REFLEX): HIV Screen 4th Generation wRfx: NONREACTIVE

## 2019-08-19 LAB — RPR: RPR Ser Ql: NONREACTIVE

## 2019-08-19 LAB — HEPATITIS B SURFACE ANTIGEN: Hepatitis B Surface Ag: NEGATIVE

## 2019-08-19 MED ORDER — FLUCONAZOLE 150 MG PO TABS: 150.0000 mg | ORAL_TABLET | Freq: Once | ORAL | 0 refills | Status: AC

## 2019-08-19 MED ORDER — TINIDAZOLE 500 MG PO TABS: 1000.0000 mg | ORAL_TABLET | Freq: Every day | ORAL | 2 refills | Status: DC

## 2019-08-19 NOTE — Telephone Encounter (Signed)
Called to advise of results and rx, no answer, left vm ?

## 2019-08-20 ENCOUNTER — Other Ambulatory Visit: Payer: Medicaid Other

## 2019-08-27 ENCOUNTER — Ambulatory Visit: Payer: Medicaid Other | Admitting: Family Medicine

## 2019-09-03 ENCOUNTER — Ambulatory Visit: Payer: Medicaid Other | Admitting: Family Medicine

## 2019-09-08 ENCOUNTER — Ambulatory Visit: Payer: Medicaid Other | Admitting: Family Medicine

## 2019-09-09 ENCOUNTER — Ambulatory Visit (INDEPENDENT_AMBULATORY_CARE_PROVIDER_SITE_OTHER): Payer: Medicaid Other | Admitting: Family Medicine

## 2019-09-09 ENCOUNTER — Encounter: Payer: Self-pay | Admitting: Family Medicine

## 2019-09-09 ENCOUNTER — Other Ambulatory Visit: Payer: Self-pay

## 2019-09-09 ENCOUNTER — Ambulatory Visit (INDEPENDENT_AMBULATORY_CARE_PROVIDER_SITE_OTHER): Payer: Medicaid Other

## 2019-09-09 DIAGNOSIS — G8929 Other chronic pain: Secondary | ICD-10-CM

## 2019-09-09 DIAGNOSIS — M5136 Other intervertebral disc degeneration, lumbar region: Secondary | ICD-10-CM

## 2019-09-09 DIAGNOSIS — M545 Low back pain: Secondary | ICD-10-CM

## 2019-09-09 MED ORDER — NABUMETONE 500 MG PO TABS: 500.0000 mg | ORAL_TABLET | Freq: Two times a day (BID) | ORAL | 3 refills | Status: DC | PRN

## 2019-09-09 MED ORDER — BACLOFEN 10 MG PO TABS: 5.0000 mg | ORAL_TABLET | Freq: Three times a day (TID) | ORAL | 3 refills | Status: DC | PRN
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
## 2019-09-11 ENCOUNTER — Ambulatory Visit (INDEPENDENT_AMBULATORY_CARE_PROVIDER_SITE_OTHER): Payer: Medicaid Other

## 2019-09-11 VITALS — Wt 316.6 lb

## 2019-09-11 DIAGNOSIS — Z23 Encounter for immunization: Secondary | ICD-10-CM | POA: Diagnosis not present

## 2019-09-11 NOTE — Progress Notes (Signed)
   Covid-19 Vaccination Clinic  Name:  Latoya Stark    MRN: [id number] DOB: 1995-04-26  09/11/2019  Ms. Duignan was observed post Covid-19 immunization for 15 minutes without incident. She was provided with Vaccine Information Sheet and instruction to access the V-Safe system.   Ms. Hudman was instructed to call 911 with any severe reactions post vaccine: Marland Kitchen Difficulty breathing  . Swelling of face and throat  . A fast heartbeat  . A bad rash all over body  . Dizziness and weakness   Immunizations Administered    Name Date Dose VIS Date Route   Pfizer COVID-19 Vaccine 09/11/2019  4:43 PM 0.3 mL 03/05/2018 Intramuscular   Manufacturer: [employer], [employer]   Lot: [id number]   [place]: [id number]

## 2019-09-24 ENCOUNTER — Ambulatory Visit: Payer: Medicaid Other

## 2019-09-25 ENCOUNTER — Other Ambulatory Visit: Payer: Self-pay

## 2019-09-25 ENCOUNTER — Ambulatory Visit: Payer: Medicaid Other | Attending: Family Medicine

## 2019-09-25 DIAGNOSIS — G8929 Other chronic pain: Secondary | ICD-10-CM | POA: Diagnosis present

## 2019-09-25 DIAGNOSIS — M62830 Muscle spasm of back: Secondary | ICD-10-CM | POA: Diagnosis present

## 2019-09-25 DIAGNOSIS — M545 Low back pain: Secondary | ICD-10-CM | POA: Diagnosis present

## 2019-09-25 DIAGNOSIS — M6281 Muscle weakness (generalized): Secondary | ICD-10-CM

## 2019-09-25 DIAGNOSIS — M5136 Other intervertebral disc degeneration, lumbar region: Secondary | ICD-10-CM | POA: Diagnosis present
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
## 2019-09-30 ENCOUNTER — Telehealth: Payer: Self-pay | Admitting: Family Medicine

## 2019-09-30 ENCOUNTER — Encounter: Payer: Self-pay | Admitting: *Deleted

## 2019-09-30 NOTE — Telephone Encounter (Signed)
I have filled out accomodation form to the best of my ability. No specific restrictions. May consider regular breaks and lunch to facilitate adequate hydration and meal schedule. If she is continuing to have headaches or breakthrough seizure, she needs follow up. She was made aware of this in July.

## 2019-09-30 NOTE — Telephone Encounter (Signed)
Form given to MR to fax over to [employer]. Letter was printed signed and faxed to [employer] as well. Fax confirmation received [phone].

## 2019-09-30 NOTE — Telephone Encounter (Signed)
I called pt after receiving accomodation p/w at my desk.  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  She is trying to get to day position.  She gets headaches from working on computer and stress.  She takes only tylenol for her headaches, and has epilepsy and takes keppra.  She has had 3 sz since last seen.  She states she has been compliant with taking her keppra.  Has missed 6-7 days from work due to migraines, 3 in [place] and 4 in September.  Needs form by 1700, and then letter stating she has epilepsy.  I told her that forms are dropped off or faxed to [place] and 10-14 days are the time frame to get back.  I would connect with Amy NP and speak to her about this form, could not promise it would be done by 1700 today.

## 2019-09-30 NOTE — Telephone Encounter (Signed)
Pt called,employer has requested proof of Epilepsy from my physician. Would like a call to discuss with nurse.

## 2019-10-01 DIAGNOSIS — Z0289 Encounter for other administrative examinations: Secondary | ICD-10-CM

## 2019-10-06 ENCOUNTER — Ambulatory Visit: Payer: Medicaid Other | Admitting: Physical Therapy

## 2019-10-06 ENCOUNTER — Encounter: Payer: Self-pay | Admitting: Physical Therapy

## 2019-10-06 ENCOUNTER — Other Ambulatory Visit: Payer: Self-pay

## 2019-10-06 ENCOUNTER — Ambulatory Visit (INDEPENDENT_AMBULATORY_CARE_PROVIDER_SITE_OTHER): Payer: Medicaid Other

## 2019-10-06 ENCOUNTER — Telehealth: Payer: Self-pay | Admitting: Physical Therapy

## 2019-10-06 VITALS — BP 126/82 | HR 100 | Ht 65.0 in | Wt 314.0 lb

## 2019-10-06 DIAGNOSIS — Z3201 Encounter for pregnancy test, result positive: Secondary | ICD-10-CM | POA: Diagnosis not present

## 2019-10-06 DIAGNOSIS — Z3480 Encounter for supervision of other normal pregnancy, unspecified trimester: Secondary | ICD-10-CM

## 2019-10-06 LAB — POCT URINE PREGNANCY: Preg Test, Ur: POSITIVE — AB

## 2019-10-06 NOTE — Progress Notes (Signed)
Latoya Stark presents today for UPT. She has no unusual complaints.  LMP:08/24/2019 EDD 05/30/2020 GA     [redacted]w[redacted]D     OBJECTIVE: Appears well, in no apparent distress.  OB History    Gravida  6   Para  1   Term  1   Preterm      AB  4   Living  1     SAB  4   TAB      Ectopic      Multiple      Live Births  1          Home UPT Result:POSITIVE In-Office UPT result:POSITIVE  I have reviewed the patient's medical, obstetrical, social, and family histories, and medications.   ASSESSMENT: Positive pregnancy test  PLAN Prenatal care to be completed at: [facility]

## 2019-10-06 NOTE — Telephone Encounter (Signed)
Called patient regarding today's missed appt. She was a no show but plans to be here Fri 10/10/19. She has a lot of family stress right now and was apologetic.  Raeford Razor, PT 10/06/19 9:41 AM Phone: [phone] Fax: [phone]

## 2019-10-06 NOTE — Progress Notes (Signed)
Patient was assessed and managed by nursing staff during this encounter. I have reviewed the chart and agree with the documentation and plan. I have also made any necessary editorial changes.  Mora Bellman, MD 10/06/2019 4:25 PM

## 2019-10-07 ENCOUNTER — Inpatient Hospital Stay (HOSPITAL_COMMUNITY)
Admission: AD | Admit: 2019-10-07 | Discharge: 2019-10-07 | Disposition: A | Discharge status: Home / Self Care | Payer: Medicaid Other | Attending: Family Medicine | Admitting: Family Medicine

## 2019-10-07 ENCOUNTER — Inpatient Hospital Stay (HOSPITAL_COMMUNITY): Payer: Medicaid Other

## 2019-10-07 DIAGNOSIS — O26899 Other specified pregnancy related conditions, unspecified trimester: Secondary | ICD-10-CM

## 2019-10-07 DIAGNOSIS — O280 Abnormal hematological finding on antenatal screening of mother: Secondary | ICD-10-CM

## 2019-10-07 DIAGNOSIS — R109 Unspecified abdominal pain: Secondary | ICD-10-CM | POA: Insufficient documentation

## 2019-10-07 LAB — CBC
HCT: 36.7 % (ref 36.0–46.0)
Hemoglobin: 11 g/dL — ABNORMAL LOW (ref 12.0–15.0)
MCH: 24 pg — ABNORMAL LOW (ref 26.0–34.0)
MCHC: 30 g/dL (ref 30.0–36.0)
MCV: 80 fL (ref 80.0–100.0)
Platelets: 398 10*3/uL (ref 150–400)
RBC: 4.59 MIL/uL (ref 3.87–5.11)
RDW: 14.7 % (ref 11.5–15.5)
WBC: 6.7 10*3/uL (ref 4.0–10.5)
nRBC: 0 % (ref 0.0–0.2)

## 2019-10-07 LAB — HCG, QUANTITATIVE, PREGNANCY: hCG, Beta Chain, Quant, S: 1 m[IU]/mL (ref <5)

## 2019-10-07 NOTE — Discharge Instructions (Signed)
Lactic acid; Citric acid; Potassium bitartrate vaginal gel What is this medicine? LACTIC ACID; CITRIC ACID; POTASSIUM BITARTRATE (LAK tuhk AS id; SIH trik AS id; poe TASS ee um bi TAAR treit) is used for birth control when you have sexual intercourse to help prevent pregnancy. This medicine may be used for other purposes; ask your health care provider or pharmacist if you have questions. COMMON BRAND NAME(S): PHEXXI What should I tell my health care provider before I take this medicine? They need to know if you have any of these conditions:  frequent kidney or urinary tract infections  HIV or AIDS  pelvic or vaginal infection  sexually transmitted disease, like herpes, gonorrhea, or chlamydia  an unusual or allergic reaction to lactic acid, citric acid, potassium bitartrate, other medicines, foods, dyes, or preservatives  pregnant or trying to get pregnant  breast-feeding How should I use this medicine? This medicine is for use in the vagina only. Follow the directions on the prescription label. Wash hands before and after use. To prevent pregnancy, it is very important that this product is used properly. This product must be applied before sexual contact begins. Do not use it in the rectum. Make sure you carefully read and follow the instructions that come with the product. If you have vaginal sex more than 1 time within 1 hour, you must use a new applicator. Use exactly as directed. Talk to your pediatrician regarding the use of this medicine in children. Special care may be needed. Overdosage: If you think you have taken too much of this medicine contact a poison control center or emergency room at once. NOTE: This medicine is only for you. Do not share this medicine with others. What if I miss a dose? Use before each time you have sexual intercourse as directed on the label. If you miss a dose, you may become pregnant. What may interact with this medicine? Interactions are not  expected. This birth control may be used with other medicines used in the vagina to treat infections including miconazole, metronidazole, and tioconazole. If you have questions ask your doctor or pharmacist. Do not use any other vaginal products at the same time without talking to your health care professional. This list may not describe all possible interactions. Give your health care provider a list of all the medicines, herbs, non-prescription drugs, or dietary supplements you use. Also tell them if you smoke, drink alcohol, or use illegal drugs. Some items may interact with your medicine. What should I watch for while using this medicine? This product does not protect you against HIV infection (AIDS) or other sexually transmitted diseases. This product may be used at any time of your menstrual cycle. This product may be used as soon as your healthcare provider tells you it is safe for you to have sex after childbirth, abortion, or miscarriage. This product may be used with most hormonal birth control methods; a vaginal diaphragm; or latex, polyurethane and polyisoprene condoms. Avoid using this product with contraceptive vaginal rings. What side effects may I notice from receiving this medicine? Side effects that you should report to your doctor or health care professional as soon as possible:  allergic reactions like skin rash, itching or hives, swelling  signs and symptoms of infection like fever; chills; pelvic pain; cloudy urine; pain or trouble passing urine  vaginal discharge, itching or odor  vaginal irritation or burning Side effects that usually do not require medical attention (report these to your doctor or health care professional  if they continue or are bothersome):  mild vaginal irritation This list may not describe all possible side effects. Call your doctor for medical advice about side effects. You may report side effects to FDA at 1-800-FDA-1088. Where should I keep my  medicine? Keep out of the reach of children. Store at room temperature between 15 and 30 degrees C (59 and 86 degrees F). Keep in the foil pouch until ready for use. Follow the directions on the product label. Throw away any unused medicine after the expiration date. NOTE: This sheet is a summary. It may not cover all possible information. If you have questions about this medicine, talk to your doctor, pharmacist, or health care provider.  2020 Elsevier/Gold Standard (2018-06-06 09:34:36)

## 2019-10-07 NOTE — MAU Provider Note (Addendum)
First Provider Initiated Contact with Patient 10/07/19 2051      S [name] is a 24 y.o. [phone] non-pregnant female who presents to MAU today with complaint of abdominal pain.   Patient reports pain started this morning and has been throughout the day. She states she had a negative and faint positive UPT at home on Sunday and a positive UPT at [facility] on Monday.  However, patient states she questioned that positive result.   O BP 119/74 (BP Location: Right Arm)   Pulse 100   Temp 98.7 F (37.1 C) (Oral)   Resp 18   Ht 5\' 5"  (1.651 m)   Wt (!) 142.9 kg   LMP 08/24/2019 (Exact Date)   SpO2 100%   BMI 52.42 kg/m   Results for orders placed or performed during the hospital encounter of 10/07/19 (from the past 24 hour(s))  CBC     Status: Abnormal   Collection Time: 10/07/19  5:27 PM  Result Value Ref Range   WBC 6.7 4.0 - 10.5 K/uL   RBC 4.59 3.87 - 5.11 MIL/uL   Hemoglobin 11.0 (L) 12.0 - 15.0 g/dL   HCT 36.7 36 - 46 %   MCV 80.0 80.0 - 100.0 fL   MCH 24.0 (L) 26.0 - 34.0 pg   MCHC 30.0 30.0 - 36.0 g/dL   RDW 14.7 11.5 - 15.5 %   Platelets 398 150 - 400 K/uL   nRBC 0.0 0.0 - 0.2 %  hCG, quantitative, pregnancy     Status: None   Collection Time: 10/07/19  5:27 PM  Result Value Ref Range   hCG, Beta Chain, Quant, S 1 <5 mIU/mL   US OB LESS THAN 14 WEEKS WITH OB TRANSVAGINAL  Result Date: 10/07/2019 CLINICAL DATA:  Pain EXAM: OBSTETRIC <14 WK [place] AND TRANSVAGINAL OB US TECHNIQUE: Both transabdominal and transvaginal ultrasound examinations were performed for complete evaluation of the gestation as well as the maternal uterus, adnexal regions, and pelvic cul-de-sac. Transvaginal technique was performed to assess early pregnancy. COMPARISON:  Obstetrical ultrasound from a prior gestation 02/21/2019 FINDINGS: Intrauterine gestational sac: None Yolk sac:  Not Visualized. Embryo:  Not Visualized. Cardiac Activity: Not Visualized. Maternal uterus/adnexae: Anteverted uterus.  Mildly lobular contours of the endometrium with thickening up to 7.1 mm, nonspecific. Could reflect some decidual reaction though a discrete gestational sac is not clearly identified. Few nabothian cysts at the level of the cervix. Normal ovaries are seen bilaterally. No adnexal collection or mass is evident. No free fluid in the pelvis. Technologist noted a technically challenging exam due to body habitus. IMPRESSION: No intrauterine pregnancy visualized. Differential considerations would include early intrauterine pregnancy too early to visualize, spontaneous abortion, or occult ectopic pregnancy. Recommend close clinical followup and serial quantitative beta HCGs and ultrasounds. Electronically Signed   By: Lovena Le M.D.   On: 10/07/2019 20:12    Physical Exam Vitals and nursing note reviewed.  Constitutional:      Appearance: She is well-developed. She is obese.  HENT:     Head: Normocephalic and atraumatic.  Eyes:     Conjunctiva/sclera: Conjunctivae normal.  Cardiovascular:     Rate and Rhythm: Normal rate and regular rhythm.  Pulmonary:     Effort: Pulmonary effort is normal.  Abdominal:     General: Abdomen is protuberant.  Musculoskeletal:        General: Normal range of motion.     Cervical back: Normal range of motion.  Neurological:  Mental Status: She is alert and oriented to person, place, and time.  Psychiatric:        Mood and Affect: Mood normal.        Thought Content: Thought content normal.     A Non pregnant female Medical screening exam complete Abdominal Pain  P Reviewed results and informed of non pregnant state. Discussed that positive in office could of been a false or mislabeled specimen.  Patient states she does not desire pregnancy. Contraception offered and declined. Discharge from MAU in stable condition. Warning signs for worsening condition that would warrant emergency follow-up discussed Patient may return to MAU as needed for pregnancy  related complaints  Gavin Pound, CNM 10/07/2019 8:52 PM

## 2019-10-07 NOTE — MAU Note (Signed)
Called her dr's office this morning.  She was in pain, was in her lower abd. At first was  Coming and going, then was constant.  Went to the bathroom, when she wiped she saw blood, "wasn't like clogs", but she bled for a few minutes, cleaned up, no longer bleeding.  Stopped around 1530, still having pain.  Has not been seen in office yet, first appt is the 25th.

## 2019-10-10 ENCOUNTER — Ambulatory Visit: Payer: Medicaid Other | Attending: Family Medicine

## 2019-10-10 ENCOUNTER — Telehealth: Payer: Self-pay

## 2019-10-10 DIAGNOSIS — R262 Difficulty in walking, not elsewhere classified: Secondary | ICD-10-CM | POA: Insufficient documentation

## 2019-10-10 DIAGNOSIS — M5136 Other intervertebral disc degeneration, lumbar region: Secondary | ICD-10-CM | POA: Insufficient documentation

## 2019-10-10 DIAGNOSIS — G8929 Other chronic pain: Secondary | ICD-10-CM | POA: Insufficient documentation

## 2019-10-10 DIAGNOSIS — M6281 Muscle weakness (generalized): Secondary | ICD-10-CM | POA: Insufficient documentation

## 2019-10-10 DIAGNOSIS — M62830 Muscle spasm of back: Secondary | ICD-10-CM | POA: Insufficient documentation

## 2019-10-10 DIAGNOSIS — R2689 Other abnormalities of gait and mobility: Secondary | ICD-10-CM | POA: Insufficient documentation

## 2019-10-10 DIAGNOSIS — M545 Low back pain, unspecified: Secondary | ICD-10-CM | POA: Insufficient documentation

## 2019-10-10 NOTE — Telephone Encounter (Signed)
Called pt at 8:15 AM to offer 1:15 PM opening versus 2:00 PM and to check in with pt following hospital admission a few days ago. Advised pt to call back confirm appt time today.  Phill Myron. Yvette Rack, PT, DPT

## 2019-10-13 ENCOUNTER — Encounter: Payer: Self-pay | Admitting: Physical Therapy

## 2019-10-13 ENCOUNTER — Telehealth: Payer: Self-pay | Admitting: Physical Therapy

## 2019-10-13 ENCOUNTER — Ambulatory Visit: Payer: Medicaid Other | Admitting: Physical Therapy

## 2019-10-13 NOTE — Telephone Encounter (Signed)
Patient was a no show for her 3rd visit.  Left her a message to notify her that we would be cancelling the remainder of her appts.  She may call to reschedule 1 visit at a time if needed.    Raeford Razor, PT 10/13/19 3:47 PM Phone: [phone] Fax: [phone]

## 2019-10-15 ENCOUNTER — Ambulatory Visit: Payer: Medicaid Other | Admitting: Physical Therapy

## 2019-10-15 ENCOUNTER — Other Ambulatory Visit: Payer: Self-pay

## 2019-10-15 ENCOUNTER — Encounter: Payer: Self-pay | Admitting: Physical Therapy

## 2019-10-15 DIAGNOSIS — R2689 Other abnormalities of gait and mobility: Secondary | ICD-10-CM

## 2019-10-15 DIAGNOSIS — R262 Difficulty in walking, not elsewhere classified: Secondary | ICD-10-CM | POA: Diagnosis present

## 2019-10-15 DIAGNOSIS — G8929 Other chronic pain: Secondary | ICD-10-CM

## 2019-10-15 DIAGNOSIS — M545 Low back pain, unspecified: Secondary | ICD-10-CM | POA: Diagnosis present

## 2019-10-15 DIAGNOSIS — M6281 Muscle weakness (generalized): Secondary | ICD-10-CM

## 2019-10-15 DIAGNOSIS — M5136 Other intervertebral disc degeneration, lumbar region: Secondary | ICD-10-CM

## 2019-10-15 DIAGNOSIS — M62830 Muscle spasm of back: Secondary | ICD-10-CM

## 2019-10-15 NOTE — Patient Instructions (Signed)
Access Code: [email or web address]: [email or web address]: 10/06/2021Prepared by: Anderson Malta PaaExercises  Seated Hamstring Stretch - 3-5 x daily - 7 x weekly - 1 sets - 3-5 reps - 20 hold  Seated Piriformis Stretch - 3-5 x daily - 7 x weekly - 1 sets - 3-5 reps - 20 hold  Seated Flexion Stretch - 3-5 x daily - 7 x weekly - 1 sets - 3-5 reps - 10-20 hold  Supine Posterior Pelvic Tilt - 2 x daily - 7 x weekly - 2 sets - 10 reps - 5 hold  Supine Lower Trunk Rotation - 2 x daily - 7 x weekly - 2 sets - 10 reps - 10 hold  Supine Single Knee to Chest Stretch - 2 x daily - 7 x weekly - 1 sets - 3 reps - 30 hold

## 2019-10-15 NOTE — Therapy (Addendum)
[place], [place], [address] Phone: [phone]   Fax:  [phone]  Physical Therapy Treatment/Discharge  Patient Details  Name: Latoya Stark MRN: [id number] Date of Birth: 1995/10/09 Referring Provider (PT): Eunice Blase, MD   Encounter Date: 10/15/2019   PT End of Session - 10/15/19 1014    Visit Number 2    Number of Visits 4    Date for PT Re-Evaluation 11/14/19    Authorization - Visit Number 1    Authorization - Number of Visits 3    PT Start Time 1010    PT Stop Time 1055    PT Time Calculation (min) 45 min           Past Medical History:  Diagnosis Date  . ADHD (attention deficit hyperactivity disorder)   . Anemia   . Anxiety   . Asthma   . Chronic migraine 08/01/2018  . Depression   . Gonorrhea in female 10/05/2013  . 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[place]) 08/01/2018  . Headache   . Obesity   . Seizures ([place])    "when I get too hot"  . Trichomonas infection   . UTI (lower urinary tract infection)     Past Surgical History:  Procedure Laterality Date  . ADENOIDECTOMY    . DILATION AND EVACUATION N/A 08/09/2013   Procedure: DILATATION AND EVACUATION;  Surgeon: Frederico Hamman, MD;  Location: [facility];  Service: Gynecology;  Laterality: N/A;  . ORTHOPEDIC SURGERY Left    left hand  . TONSILLECTOMY      There were no vitals filed for this visit.   Subjective Assessment - 10/15/19 1014    Subjective Pt had a miscarriage.  Pain is constant 7/10, radiates to hips bilaterally.                             [facility] Adult PT Treatment/Exercise - 10/15/19 0001      Lumbar Exercises: Stretches   Active Hamstring Stretch Right;Left;3 reps;20 seconds    Single Knee to Chest Stretch Right;Left;3 reps;20 seconds    Lower Trunk Rotation 10 seconds    Lower Trunk Rotation Limitations x 10     Pelvic Tilt 10 reps    Piriformis Stretch Right;Left;3 reps;20 seconds     Piriformis Stretch Limitations seated figure 4     Other Lumbar Stretch Exercise trunk flexion 3x; 15 sec   seated         MHP 10 min seated lumbar         PT Education - 10/15/19 1135    Education Details HEP, core, pain relief    Person(s) Educated Patient    Methods Explanation;Demonstration    Comprehension Verbalized understanding;Returned demonstration            PT Short Term Goals - 09/26/19 1510      PT SHORT TERM GOAL #1   Title Pt will be Ind in an Initiial HEP    Baseline Started on eval    Time 3    Period Weeks    Status New    Target Date 10/17/19      PT SHORT TERM GOAL #2   Title Pt will voice understanding od measures for pain management and reduction.    Time 3    Period Weeks    Status New    Target Date 10/17/19  PT Long Term Goals - 09/26/19 1512      PT LONG TERM GOAL #1   Title Pt will be Ind in a final HEP    Time 7    Period Weeks    Status New    Target Date 11/14/19      PT LONG TERM GOAL #2   Title Pt will demonstrae improved trunk ext to 75% of normal    Baseline 50% limited    Time 7    Period Weeks    Status New    Target Date 11/14/19      PT LONG TERM GOAL #3   Title Improve hamstring flexibity to 60d bilat    Baseline L 51d; R 55d    Time 7    Period Weeks    Status New    Target Date 11/14/19      PT LONG TERM GOAL #4   Title Pt will report improved low back pain and functional mobility with daily activities with pain range for 0-5/10    Baseline 0-10/10    Time 7    Period Weeks    Status New    Target Date 11/14/19                 Plan - 10/15/19 1103    Clinical Impression Statement First treatment initiated.  Able to perform mat level exercises and standing with min increase in back pain, able to reduce with seated flexion.  Expanded her HEP.  Cont POC 1 visit at a time.    PT Treatment/Interventions ADLs/Self Care Home Management;Cryotherapy;Electrical  Stimulation;Ultrasound;Traction;Moist Heat;Iontophoresis 44m/ml Dexamethasone;Functional mobility training;Therapeutic activities;Therapeutic exercise;Manual techniques;Patient/family education;Dry needling;Taping    PT Next Visit Plan Assess reponse to HEP. Progress ther ex and use of modalities as indicated.    PT Home Exercise Plan [email or web address]    Consulted and Agree with Plan of Care Patient           Patient will benefit from skilled therapeutic intervention in order to improve the following deficits and impairments:  Abnormal gait, Difficulty walking, Obesity, Pain, Decreased balance, Decreased activity tolerance, Decreased mobility, Decreased strength, Postural dysfunction, Impaired flexibility  Visit Diagnosis: Muscle weakness (generalized)  Chronic bilateral low back pain without sciatica  Muscle spasm of back  DDD (degenerative disc disease), lumbar  Other abnormalities of gait and mobility  Difficulty in walking, not elsewhere classified     Problem List Patient Active Problem List   Diagnosis Date Noted  . Supervision of other normal pregnancy, antepartum 10/06/2019  . [redacted] weeks gestation of pregnancy 03/01/2019  . Seizure disorder during pregnancy, antepartum, third trimester ([place] 02/28/2019  . BMI 50.0-59.9, adult ([place] 02/20/2019  . Conversion disorder 02/10/2019  . 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 Seizure disorder in pregnancy ([place] 11/23/2018  . UTI (urinary tract infection) in pregnancy in first trimester 08/04/2018  . 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[place] 08/01/2018  . Chronic migraine 08/01/2018  . Maternal obesity affecting pregnancy, antepartum 07/30/2018  . History of seizure 07/30/2018  . Supervision of high risk pregnancy, antepartum 03/19/2017    , 10/15/2019, 12:14 PM  [facility] [address]Phone: [phone]  Fax:  [phone] Name: Latoya CASHINMRN: [id number] of Birth: 607-30-1997 JRaeford Razor PT 10/15/19 12:15 PM Phone: [phone]: [phone]  PHYSICAL THERAPY DISCHARGE SUMMARY  Visits from Start of Care: 2  Current functional level related to goals /  functional outcomes: Unknown   Remaining deficits: Pain, unknown specifics    Education / Equipment: HEP  Plan: Patient agrees to discharge.  Patient goals were not met. Patient is being discharged due to not returning since the last visit.  ?????    Raeford Razor, PT 11/28/19 10:42 AM Phone: [phone] Fax: [phone]

## 2019-10-17 ENCOUNTER — Ambulatory Visit: Payer: Medicaid Other | Admitting: Physical Therapy

## 2019-10-20 ENCOUNTER — Ambulatory Visit: Payer: Medicaid Other | Admitting: Physical Therapy

## 2019-10-21 ENCOUNTER — Ambulatory Visit: Payer: Medicaid Other | Admitting: Family Medicine

## 2019-10-22 ENCOUNTER — Encounter: Payer: Medicaid Other | Admitting: Physical Therapy

## 2019-10-27 ENCOUNTER — Encounter: Payer: Medicaid Other | Admitting: Physical Therapy

## 2019-10-27 ENCOUNTER — Ambulatory Visit: Payer: Medicaid Other | Admitting: Physical Therapy

## 2019-10-28 NOTE — Telephone Encounter (Signed)
Pt is asking that the questionnaire be completed and faxed to [facility] to [phone]

## 2019-10-28 NOTE — Telephone Encounter (Signed)
I re faxed the questionnaire on 10/28/19

## 2019-10-29 ENCOUNTER — Encounter: Payer: Medicaid Other | Admitting: Physical Therapy

## 2019-10-30 ENCOUNTER — Ambulatory Visit: Payer: Medicaid Other | Admitting: Family Medicine

## 2019-11-03 ENCOUNTER — Ambulatory Visit: Payer: Medicaid Other | Admitting: Family Medicine

## 2019-11-05 ENCOUNTER — Encounter: Payer: Self-pay | Admitting: Family Medicine

## 2019-11-05 ENCOUNTER — Ambulatory Visit (INDEPENDENT_AMBULATORY_CARE_PROVIDER_SITE_OTHER): Payer: Medicaid Other | Admitting: Family Medicine

## 2019-11-05 ENCOUNTER — Other Ambulatory Visit: Payer: Self-pay

## 2019-11-05 DIAGNOSIS — M545 Low back pain, unspecified: Secondary | ICD-10-CM | POA: Diagnosis not present

## 2019-11-05 DIAGNOSIS — G8929 Other chronic pain: Secondary | ICD-10-CM | POA: Diagnosis not present

## 2019-11-05 NOTE — Progress Notes (Signed)
Office Visit Note   Patient: Latoya Stark           Date of Birth: 03/01/1995           MRN: [id number] Visit Date: 11/05/2019 Requested by: No referring provider defined for this encounter. PCP: Patient, No Pcp Per  Subjective: Chief Complaint  Patient presents with  . Lower Back - Pain, Follow-up    Not much improvement with PT. Cannot sit up straight in a chair - has to lean backward.     HPI: She is here for follow-up chronic back pain.  No improvement with physical therapy and home exercises.  Yesterday was a particularly bad day for her with pain radiating into both legs.  She has tried Robaxin and Relafen with minimal improvement.              ROS:   All other systems were reviewed and are negative.  Objective: Vital Signs: LMP 08/24/2019 (Exact Date)   Physical Exam:  General:  Alert and oriented, in no acute distress. Pulm:  Breathing unlabored. Psy:  Normal mood, congruent affect  Low back: She is tender to palpation in the lumbar paraspinous muscles especially near L5-S1.  Negative straight leg raise, stork test is equivocal.  She has surprisingly good range of motion.  Lower extremity strength and reflexes are normal.  Imaging: No results found.  Assessment & Plan: 1.  Chronic back pain, question facet syndrome -We will proceed with MRI scan to further evaluate.  Depending on results, could contemplate referral for injection or possibly chiropractic.     Procedures: No procedures performed  No notes on file     PMFS History: Patient Active Problem List   Diagnosis Date Noted  . Supervision of other normal pregnancy, antepartum 10/06/2019  . [redacted] weeks gestation of pregnancy 03/01/2019  . Seizure disorder during pregnancy, antepartum, third trimester ([place]) 02/28/2019  . BMI 50.0-59.9, adult ([place]) 02/20/2019  . Conversion disorder 02/10/2019  . 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 Seizure disorder in pregnancy ([place]) 11/23/2018  . UTI (urinary tract  infection) in pregnancy in first trimester 08/04/2018  . 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[place]) 08/01/2018  . Chronic migraine 08/01/2018  . Maternal obesity affecting pregnancy, antepartum 07/30/2018  . History of seizure 07/30/2018  . Supervision of high risk pregnancy, antepartum 03/19/2017   Past Medical History:  Diagnosis Date  . ADHD (attention deficit hyperactivity disorder)   . Anemia   . Anxiety   . Asthma   . Chronic migraine 08/01/2018  . Depression   . Gonorrhea in female 10/05/2013  . 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[place]) 08/01/2018  . Headache   . Obesity   . Seizures ([place])    "when I get too hot"  . Trichomonas infection   . UTI (lower urinary tract infection)     Family History  Problem Relation Age of Onset  . Seizures Mother   . Stroke Mother   . Hypertension Father   . Diabetes Maternal Grandmother   . Colon cancer Maternal Grandmother   . Hypertension Maternal Grandmother   . Clotting disorder Maternal Grandmother   . Breast cancer Maternal Grandmother   . Stroke Maternal Grandmother   . Diabetes Paternal Grandmother   . Seizures Maternal Aunt     Past Surgical History:  Procedure Laterality Date  . ADENOIDECTOMY    . DILATION AND EVACUATION N/A 08/09/2013   Procedure: DILATATION AND EVACUATION;  Surgeon: Frederico Hamman, MD;  Location: [facility];  Service: Gynecology;  Laterality: N/A;  . ORTHOPEDIC SURGERY Left    left hand  . TONSILLECTOMY     Social History   Occupational History  . Not on file  Tobacco Use  . Smoking status: Never Smoker  . Smokeless tobacco: Never Used  Vaping Use  . Vaping Use: Never used  Substance and Sexual Activity  . Alcohol use: No    Alcohol/week: 0.0 standard drinks  . Drug use: No  . Sexual activity: Yes    Partners: Male    Birth control/protection: None    Comment: one partner

## 2019-11-11 ENCOUNTER — Ambulatory Visit: Payer: Medicaid Other | Admitting: Family Medicine

## 2019-11-18 ENCOUNTER — Encounter: Payer: Medicaid Other | Admitting: Obstetrics and Gynecology

## 2019-11-29 ENCOUNTER — Other Ambulatory Visit: Payer: Medicaid Other

## 2020-02-02 ENCOUNTER — Other Ambulatory Visit: Payer: Self-pay

## 2020-02-02 ENCOUNTER — Ambulatory Visit (INDEPENDENT_AMBULATORY_CARE_PROVIDER_SITE_OTHER): Payer: Medicaid Other

## 2020-02-02 ENCOUNTER — Other Ambulatory Visit (HOSPITAL_COMMUNITY)
Admission: RE | Admit: 2020-02-02 | Discharge: 2020-02-02 | Disposition: A | Discharge status: Home / Self Care | Payer: Medicaid Other | Source: Ambulatory Visit | Attending: Obstetrics | Admitting: Obstetrics

## 2020-02-02 DIAGNOSIS — Z113 Encounter for screening for infections with a predominantly sexual mode of transmission: Secondary | ICD-10-CM | POA: Insufficient documentation

## 2020-02-02 DIAGNOSIS — N898 Other specified noninflammatory disorders of vagina: Secondary | ICD-10-CM

## 2020-02-02 DIAGNOSIS — R8290 Unspecified abnormal findings in urine: Secondary | ICD-10-CM | POA: Diagnosis not present

## 2020-02-02 DIAGNOSIS — N926 Irregular menstruation, unspecified: Secondary | ICD-10-CM

## 2020-02-02 LAB — POCT URINALYSIS DIPSTICK
Bilirubin, UA: NEGATIVE
Blood, UA: NEGATIVE
Glucose, UA: NEGATIVE
Ketones, UA: NEGATIVE
Leukocytes, UA: NEGATIVE
Nitrite, UA: NEGATIVE
Odor: NEGATIVE
Protein, UA: NEGATIVE
Spec Grav, UA: 1.01 (ref 1.010–1.025)
Urobilinogen, UA: 0.2 E.U./dL
pH, UA: 7 (ref 5.0–8.0)

## 2020-02-02 LAB — POCT URINE PREGNANCY: Preg Test, Ur: NEGATIVE

## 2020-02-02 NOTE — Progress Notes (Signed)
SUBJECTIVE:  25 y.o. female complains of malodorous vaginal discharge for 4 days. Pt also c/o malodorous urine, denies dysuria, denies urgency/frequency.  Denies abnormal vaginal bleeding or significant pelvic pain or fever. Pt requests UPT for late period and all STD testing.   LMP: 12/26/19  OBJECTIVE:  She appears well, afebrile. Urine dipstick: negative for all components.  ASSESSMENT:  Vaginal Discharge  Vaginal Odor Urine odor - negative   PLAN:  GC, chlamydia, trichomonas, BVAG, CVAG probe sent to lab. Treatment: To be determined once lab results are received and reviewed by the provider  ROV prn if symptoms persist or worsen.

## 2020-02-03 LAB — CERVICOVAGINAL ANCILLARY ONLY
Bacterial Vaginitis (gardnerella): NEGATIVE
Candida Glabrata: NEGATIVE
Candida Vaginitis: POSITIVE — AB
Chlamydia: NEGATIVE
Comment: NEGATIVE
Comment: NEGATIVE
Comment: NEGATIVE
Comment: NEGATIVE
Comment: NEGATIVE
Comment: NORMAL
Neisseria Gonorrhea: NEGATIVE
Trichomonas: NEGATIVE

## 2020-02-03 LAB — RPR: RPR Ser Ql: NONREACTIVE

## 2020-02-03 LAB — HIV ANTIBODY (ROUTINE TESTING W REFLEX): HIV Screen 4th Generation wRfx: NONREACTIVE

## 2020-02-03 LAB — HEPATITIS C ANTIBODY: Hep C Virus Ab: 0.1 s/co ratio (ref 0.0–0.9)

## 2020-02-03 LAB — HEPATITIS B SURFACE ANTIGEN: Hepatitis B Surface Ag: NEGATIVE

## 2020-02-04 ENCOUNTER — Other Ambulatory Visit: Payer: Self-pay | Admitting: Obstetrics and Gynecology

## 2020-02-04 MED ORDER — FLUCONAZOLE 150 MG PO TABS: 150.0000 mg | ORAL_TABLET | Freq: Once | ORAL | 3 refills | Status: AC

## 2020-02-05 ENCOUNTER — Other Ambulatory Visit: Payer: Self-pay | Admitting: Obstetrics and Gynecology

## 2020-02-05 LAB — URINE CULTURE

## 2020-02-05 MED ORDER — NITROFURANTOIN MONOHYD MACRO 100 MG PO CAPS: 100.0000 mg | ORAL_CAPSULE | Freq: Two times a day (BID) | ORAL | 1 refills | Status: DC

## 2020-02-05 NOTE — Progress Notes (Signed)
Patient was assessed and managed by nursing staff during this encounter. I have reviewed the chart and agree with the documentation and plan. I have also made any necessary editorial changes.  Mora Bellman, MD 02/05/2020 8:32 AM

## 2020-03-22 ENCOUNTER — Telehealth: Payer: Self-pay | Admitting: Family Medicine

## 2020-03-22 ENCOUNTER — Ambulatory Visit: Payer: Medicaid Other | Admitting: Family Medicine

## 2020-03-22 NOTE — Telephone Encounter (Signed)
LMVM for pt to return call concerning had sz.

## 2020-03-22 NOTE — Patient Instructions (Incomplete)
Below is our plan:  We will ***  Please make sure you are staying well hydrated. I recommend 50-60 ounces daily. Well balanced diet and regular exercise encouraged. Consistent sleep schedule with 6-8 hours recommended.   Please continue follow up with care team as directed.   Follow up with *** in ***  You may receive a survey regarding today's visit. I encourage you to leave honest feed back as I do use this information to improve patient care. Thank you for seeing me today!       Seizure, Adult A seizure is a sudden burst of abnormal electrical and chemical activity in the brain. Seizures usually last from 30 seconds to 2 minutes.  What are the causes? Common causes of this condition include:  Fever or infection.  Problems that affect the brain. These may include: ? A brain or head injury. ? Bleeding in the brain. ? A brain tumor.  Low levels of blood sugar or salt.  Kidney problems or liver problems.  Conditions that are passed from parent to child (are inherited).  Problems with a substance, such as: ? Having a reaction to a drug or a medicine. ? Stopping the use of a substance all of a sudden (withdrawal).  A stroke.  Disorders that affect how you develop. Sometimes, the cause may not be known.  What increases the risk?  Having someone in your family who has epilepsy. In this condition, seizures happen again and again over time. They have no clear cause.  Having had a tonic-clonic seizure before. This type of seizure causes you to: ? Tighten the muscles of the whole body. ? Lose consciousness.  Having had a head injury or strokes before.  Having had a lack of oxygen at birth. What are the signs or symptoms? There are many types of seizures. The symptoms vary depending on the type of seizure you have. Symptoms during a seizure  Shaking that you cannot control (convulsions) with fast, jerky movements of muscles.  Stiffness of the body.  Breathing  problems.  Feeling mixed up (confused).  Staring or not responding to sound or touch.  Head nodding.  Eyes that blink, flutter, or move fast.  Drooling, grunting, or making clicking sounds with your mouth  Losing control of when you pee or poop. Symptoms before a seizure  Feeling afraid, nervous, or worried.  Feeling like you may vomit.  Feeling like: ? You are moving when you are not. ? Things around you are moving when they are not.  Feeling like you saw or heard something before (dj vu).  Odd tastes or smells.  Changes in how you see. You may see flashing lights or spots. Symptoms after a seizure  Feeling confused.  Feeling sleepy.  Headache.  Sore muscles. How is this treated? If your seizure stops on its own, you will not need treatment. If your seizure lasts longer than 5 minutes, you will normally need treatment. Treatment may include:  Medicines given through an IV tube.  Avoiding things, such as medicines, that are known to cause your seizures.  Medicines to prevent seizures.  A device to prevent or control seizures.  Surgery.  A diet low in carbohydrates and high in fat (ketogenic diet). Follow these instructions at home: Medicines  Take over-the-counter and prescription medicines only as told by your doctor.  Avoid foods or drinks that may keep your medicine from working, such as alcohol. Activity  Follow instructions about driving, swimming, or doing things that would  be dangerous if you had another seizure. Wait until your doctor says it is safe for you to do these things.  If you live in the [place] ask your local department of motor vehicles when you can drive.  Get a lot of rest. Teaching others  Teach friends and family what to do when you have a seizure. They should: ? Help you get down to the ground. ? Protect your head and body. ? Loosen any clothing around your neck. ? Turn you on your side. ? Know whether or not you need  emergency care. ? Stay with you until you are better.  Also, tell them what not to do if you have a seizure. Tell them: ? They should not hold you down. ? They should not put anything in your mouth.   General instructions  Avoid anything that gives you seizures.  Keep a seizure diary. Write down: ? What you remember about each seizure. ? What you think caused each seizure.  Keep all follow-up visits. Contact a doctor if:  You have another seizure or seizures. Call the doctor each time you have a seizure.  The pattern of your seizures changes.  You keep having seizures with treatment.  You have symptoms of being sick or having an infection.  You are not able to take your medicine. Get help right away if:  You have any of these problems: ? A seizure that lasts longer than 5 minutes. ? Many seizures in a row and you do not feel better between seizures. ? A seizure that makes it harder to breathe. ? A seizure and you can no longer speak or use part of your body.  You do not wake up right after a seizure.  You get hurt during a seizure.  You feel confused or have pain right after a seizure. These symptoms may be an emergency. Get help right away. Call your local emergency services (911 in the [place]  Do not wait to see if the symptoms will go away.  Do not drive yourself to the hospital. Summary  A seizure is a sudden burst of abnormal electrical and chemical activity in the brain. Seizures normally last from 30 seconds to 2 minutes.  Causes of seizures include illness, injury to the head, low levels of blood sugar or salt, and certain conditions.  Most seizures will stop on their own in less than 5 minutes. Seizures that last longer than 5 minutes are a medical emergency and need treatment right away.  Many medicines are used to treat seizures. Take over-the-counter and prescription medicines only as told by your doctor. This information is not intended to replace  advice given to you by your health care provider. Make sure you discuss any questions you have with your health care provider. Document Revised: 07/04/2019 Document Reviewed: 07/04/2019 Elsevier Patient Education  [place].

## 2020-03-22 NOTE — Progress Notes (Deleted)
PATIENT: Latoya Stark DOB: Aug 11, 1995  REASON FOR VISIT: follow up HISTORY FROM: patient  No chief complaint on file.    HISTORY OF PRESENT ILLNESS:  03/22/20 ALL:  Latoya Stark returns for seizure follow up. She has continued levetiracetam 1500mg  BID.    07/31/2019 ALL:  Latoya Stark is a 25 y.o. female here today for follow up for seizures. Her husband reports that she had a seizure two days ago. He reports that she called her employer and while talking she "started shaking". He reports that her whole body was shaked. Symptoms lasted for about 2 minutes. Following the seizure she was completely back to baseline. He states that there was another similar event in May. These two events are the only "seizures" she has had. Her husband reports that she is not taking medications consistently. She admits that she had missed some doses of AED in May but insists that she has taken it consistently since. Medication was last filled for 1 month and 2 refills in February. Her husband states that there is a full bottle in her car and she reports that she has 1 more refill at the pharmacy.   She reports that she has had terrible headaches over the past 3-4 months. She is working from home now. She reports seeing her PCP but no treatment was started. She is seeing OB for post partum depression.   She delivered a healthy baby boy 5 months ago.   HISTORY: (copied from my note on 02/19/2019)  Latoya Stark is a 25 y.o. female here today for follow up for seizure. She was recently hospitalized for breakthrough seizure after admittedly missing two doses of her levetiracetam. She complained of right leg paralysis following seizure. Imaging was reassuring and she was cleared by neurology. Patient had positive Hoover's sign and concerns of give away weakness on exam. 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 She is doing fairly well. She is participating in OT. She is doing exercises at  home. She reports that she has feeling in her leg now. She is able to walk with a walker. She was last seen by OB yesterday.   She plans to deliver her baby boy, [name], at [facility]. She lives with her boyfriend. He works nights. He has two children with his ex.    HISTORY: (copied from my note on 11/18/2018)  Latoya Stark a 25 y.o.femalehere today for follow up for seizures. She is currently [redacted] weeks pregnant. She continues levetiracetam 750mg  TID.She reports that she has had 2 seizures since last being seen in July. Her fianc is here with her today and reports that her last seizure was November 2. She was sitting on the toilet and noted feeling hot and dizzy. He reports that she had convulsions for approximately 2 minutes. He states that she did urinate in the toilet while having convulsions. He feels that the past 2 seizures have been much shorter and less severe than her previous seizures. She continues to have migraines but feels that these have improved significantly. She is tolerating levetiracetam well with no obvious adverse effects. She is followed closely by OB. Last appointment was October 23. She is scheduled for follow-up again on November 23.  HISTORY: (copied fromDr Sater'snote on 08/01/2018)  I had the pleasure of seeing your patient[facility] for neurologic consultation regarding her headaches and seizures.  She is a 25 year old womanwho has a long history of  headaches and seizures since age 66. She is currently 8 to [redacted] weeks pregnant.  She has had migraine headaches for yearsbut the frequency has increased over the past few months. Headaches are daily and located bilaterally, forehead >temporal region. She gets nausea, severe photophobia and phonophobia. Headaches usually improve if she sleeps them off. Heat triggers the headache. Tylenol sometimes stops the headaches. Topamax had helped her  headaches with benefit.   She had her first seizure at age 13. 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 Her last one was early July 2020. Her previous one had been a month or two earlier (while on Dilantin). She has been having one every 2-3 months, even while on treatment. She stopped Dilantin 4 weeks ago (10 days or so before the last seizure) due to pregnancy. She is currently 8-[redacted] weeks pregnant.   She has not tried other medications besides Dilantin.She has seen Dr. Gaynell Face in the past.  REVIEW OF SYSTEMS: Out of a complete 14 system review of symptoms, the patient complains only of the following symptoms, headaches, seizures and all other reviewed systems are negative.  ALLERGIES: Allergies  Allergen Reactions  . Milk-Related Compounds Dermatitis    Breaks face out *can have 2% milk*  . Mushroom Extract Complex Anaphylaxis  . Penicillins Anaphylaxis    Has patient had a PCN reaction causing immediate rash, facial/tongue/throat swelling, SOB or lightheadedness with hypotension: Yes Has patient had a PCN reaction causing severe rash involving mucus membranes or skin necrosis: No Has patient had a PCN reaction that required hospitalization No Has patient had a PCN reaction occurring within the last 10 years: Yes If all of the above answers are "NO", then may proceed with Cephalosporin use.   [name] Other (See Comments)    Breaks face out  . Latex Itching and Swelling  . Sulfa Antibiotics Swelling  . Vicodin [Hydrocodone-Acetaminophen] Hives    HOME MEDICATIONS: Outpatient Medications Prior to Visit  Medication Sig Dispense Refill  . acetaminophen (TYLENOL) 500 MG tablet Take 1,000 mg by mouth every 6 (six) hours as needed for headache.    . albuterol (PROVENTIL HFA;VENTOLIN HFA) 108 (90 Base) MCG/ACT inhaler Inhale 2 puffs into the lungs every 6 (six) hours as needed for wheezing or shortness of  breath. 1 Inhaler 0  . baclofen (LIORESAL) 10 MG tablet Take 0.5-1 tablets (5-10 mg total) by mouth 3 (three) times daily as needed for muscle spasms. 30 each 3  . Blood Pressure Monitoring (BLOOD PRESSURE CUFF) MISC 1 Device by Does not apply route once a week. 1 each 0  . cyclobenzaprine (FLEXERIL) 10 MG tablet Take 1 tablet (10 mg total) by mouth every 8 (eight) hours as needed for muscle spasms. 30 tablet 1  . ferrous sulfate 325 (65 FE) MG tablet Take 1 tablet (325 mg total) by mouth 2 (two) times daily with a meal. 60 tablet 5  . ibuprofen (ADVIL) 600 MG tablet Take 1 tablet (600 mg total) by mouth every 6 (six) hours. (Patient not taking: Reported on 11/05/2019) 30 tablet 0  . ibuprofen (ADVIL) 800 MG tablet Take 1 tablet (800 mg total) by mouth every 8 (eight) hours as needed. (Patient not taking: Reported on 11/05/2019) 30 tablet 5  . levETIRAcetam (KEPPRA) 750 MG tablet Take 2 tablets (1,500 mg total) by mouth 2 (two) times daily. 120 tablet 0  . nabumetone (RELAFEN) 500 MG tablet Take 1 tablet (500 mg total) by  mouth 2 (two) times daily as needed. 60 tablet 3  . nitrofurantoin, macrocrystal-monohydrate, (MACROBID) 100 MG capsule Take 1 capsule (100 mg total) by mouth 2 (two) times daily. 14 capsule 1  . pantoprazole (PROTONIX) 40 MG tablet Take 1 tablet (40 mg total) by mouth daily. (Patient not taking: Reported on 09/25/2019) 30 tablet 1  . polyethylene glycol (MIRALAX / GLYCOLAX) 17 g packet Take 17 g by mouth 2 (two) times daily. 24 each 3  . Prenat w/o A-FeCbn-Meth-FA-DHA (PRENATE MINI) 29-0.6-0.4-350 MG CAPS Take 1 capsule by mouth daily before breakfast. 30 capsule 11  . tinidazole (TINDAMAX) 500 MG tablet Take 2 tablets (1,000 mg total) by mouth daily with breakfast. 10 tablet 2  . zolpidem (AMBIEN) 5 MG tablet Take 1 tablet (5 mg total) by mouth at bedtime as needed for sleep. 15 tablet 1   No facility-administered medications prior to visit.    PAST MEDICAL HISTORY: Past  Medical History:  Diagnosis Date  . ADHD (attention deficit hyperactivity disorder)   . Anemia   . Anxiety   . Asthma   . Chronic migraine 08/01/2018  . Depression   . Gonorrhea in female 10/05/2013  . 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 Headache   . Obesity   . Seizures ([place])    "when I get too hot"  . Trichomonas infection   . UTI (lower urinary tract infection)     PAST SURGICAL HISTORY: Past Surgical History:  Procedure Laterality Date  . ADENOIDECTOMY    . DILATION AND EVACUATION N/A 08/09/2013   Procedure: DILATATION AND EVACUATION;  Surgeon: Frederico Hamman, MD;  Location: [facility];  Service: Gynecology;  Laterality: N/A;  . ORTHOPEDIC SURGERY Left    left hand  . TONSILLECTOMY      FAMILY HISTORY: Family History  Problem Relation Age of Onset  . Seizures Mother   . Stroke Mother   . Hypertension Father   . Diabetes Maternal Grandmother   . Colon cancer Maternal Grandmother   . Hypertension Maternal Grandmother   . Clotting disorder Maternal Grandmother   . Breast cancer Maternal Grandmother   . Stroke Maternal Grandmother   . Diabetes Paternal Grandmother   . Seizures Maternal Aunt     SOCIAL HISTORY: Social History   Socioeconomic History  . Marital status: Single    Spouse name: Not on file  . Number of children: 1  . Years of education: Not on file  . Highest education level: Not on file  Occupational History  . Not on file  Tobacco Use  . Smoking status: Never Smoker  . Smokeless tobacco: Never Used  Vaping Use  . Vaping Use: Never used  Substance and Sexual Activity  . Alcohol use: No    Alcohol/week: 0.0 standard drinks  . Drug use: No  . Sexual activity: Yes    Partners: Male    Birth control/protection: None    Comment: one partner  Other Topics Concern  . Not on file  Social History Narrative   Caffeine: tea sometimes   Lives with fiance   Right handed    Social Determinants of Health   Financial Resource Strain:  Not on file  Food Insecurity: Not on file  Transportation Needs: Not on file  Physical Activity: Not on file  Stress: Not on file  Social Connections: Not on file  Intimate Partner Violence: Not on file      PHYSICAL EXAM  There were no vitals filed for this  visit. There is no height or weight on file to calculate BMI.  Generalized: Well developed, in no acute distress  Cardiology: normal rate and rhythm, no murmur noted Respiratory: clear to auscultation bilaterally  Neurological examination  Mentation: Alert oriented to time, place, history taking. Follows all commands speech and language fluent Cranial nerve II-XII: Pupils were equal round reactive to light. Extraocular movements were full, visual field were full  Motor: The motor testing reveals 5 over 5 strength of all 4 extremities. Good symmetric motor tone is noted throughout.  Gait and station: Gait is normal.    DIAGNOSTIC DATA (LABS, IMAGING, TESTING) - I reviewed patient records, labs, notes, testing and imaging myself where available.  No flowsheet data found.   Lab Results  Component Value Date   WBC 6.7 10/07/2019   HGB 11.0 (L) 10/07/2019   HCT 36.7 10/07/2019   MCV 80.0 10/07/2019   PLT 398 10/07/2019      Component Value Date/Time   NA 136 02/20/2019 2329   NA 144 08/04/2015 1346   K 3.6 02/20/2019 2329   CL 104 02/20/2019 2329   CO2 22 02/20/2019 2329   GLUCOSE 101 (H) 02/20/2019 2329   BUN <5 (L) 02/20/2019 2329   BUN 8 08/04/2015 1346   CREATININE 0.50 02/20/2019 2329   CALCIUM 9.1 02/20/2019 2329   PROT 6.5 02/20/2019 2329   PROT 7.1 08/04/2015 1346   ALBUMIN 3.0 (L) 02/20/2019 2329   ALBUMIN 4.3 08/04/2015 1346   AST 25 02/20/2019 2329   ALT 23 02/20/2019 2329   ALKPHOS 183 (H) 02/20/2019 2329   BILITOT 0.4 02/20/2019 2329   BILITOT 0.3 08/04/2015 1346   GFRNONAA >60 02/20/2019 2329   GFRAA >60 02/20/2019 2329   Lab Results  Component Value Date   CHOL  12/13/2006    122         ATP III CLASSIFICATION:  <200     mg/dL   Desirable  200-239  mg/dL   Borderline High  >=240    mg/dL   High   HDL 30 (L) 12/13/2006   LDLCALC  12/13/2006    76        Total Cholesterol/HDL:CHD Risk Coronary Heart Disease Risk Table                     Men   Women  1/2 Average Risk   3.4   3.3   TRIG 78 12/13/2006   CHOLHDL 4.1 12/13/2006   Lab Results  Component Value Date   HGBA1C 5.6 07/30/2018   Lab Results  Component Value Date   VITAMINB12 309 02/24/2014   Lab Results  Component Value Date   TSH 1.770 03/15/2018     ASSESSMENT AND PLAN 25 y.o. 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 here with     ICD-10-CM   1. 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 One in May and last one was 7/20. She admits that she has not taken her medications as prescribed. She feels that she has been fairly consistent with dosing but is unclear for what time frame. Initially, she only admitted to missing one dose of medication but her husband  reports that she frequently misses either the morning or evening dose. We have reviewed dates of last refills and she agrees that missed doses have been more frequent. I have explained the importance of taking levetiracetam 1500mg  twice daily every day to prevent breakthrough seizures. I have also explained that this medication may help with headaches. She was encouraged to work on healthy lifestyle habits. Sleep hygiene encouraged. She is staying up late at night even though her baby is sleeping well. I have written 1 month prescription and patient reports she has two months at home.  She will return to see me in 3 months.    No orders of the defined types were placed in this encounter.    No orders of the defined types were placed in this encounter.     I spent 15 minutes with the patient. 50% of this time was spent counseling and educating patient on plan of care and medications.    Debbora Presto, FNP-C 03/22/2020, 8:18 AM [facility] [address] [place], [place] [address] [phone]

## 2020-03-22 NOTE — Telephone Encounter (Signed)
FYI: Pt's friend, Jermaine Pinnix called, she had an episode this morning and is very tired.  Rescheduled appt

## 2020-03-23 ENCOUNTER — Telehealth (INDEPENDENT_AMBULATORY_CARE_PROVIDER_SITE_OTHER): Payer: Medicaid Other | Admitting: Family Medicine

## 2020-03-23 ENCOUNTER — Encounter: Payer: Self-pay | Admitting: Family Medicine

## 2020-03-23 DIAGNOSIS — G40409 Other generalized epilepsy and epileptic syndromes, not intractable, without status epilepticus: Secondary | ICD-10-CM | POA: Diagnosis not present

## 2020-03-23 MED ORDER — LEVETIRACETAM 750 MG PO TABS: 1500.0000 mg | ORAL_TABLET | Freq: Two times a day (BID) | ORAL | 2 refills | Status: DC

## 2020-03-23 NOTE — Progress Notes (Signed)
I have read the note, and I agree with the clinical assessment and plan.  Owynn Mosqueda A. Paije Goodhart, MD, PhD, FAAN Certified in Neurology, Clinical Neurophysiology, Sleep Medicine, Pain Medicine and Neuroimaging  [facility] [address], [address] [place],  [address] [phone]  

## 2020-03-23 NOTE — Progress Notes (Signed)
PATIENT: Latoya Stark DOB: 08/27/1995  REASON FOR VISIT: follow up HISTORY FROM: patient  Virtual Visit via Telephone Note  I connected with Latoya Stark on 03/23/20 at 11:30 AM EDT by telephone and verified that I am speaking with the correct person using two identifiers.   I discussed the limitations, risks, security and privacy concerns of performing an evaluation and management service by telephone and the availability of in person appointments. I also discussed with the patient that there may be a patient responsible charge related to this service. The patient expressed understanding and agreed to proceed.   History of Present Illness: 03/23/20 ALL MyChart visit: Latoya Stark returns for seizure follow up. She reports having a seizure yesterday. She has not taken levetiracetam in over a month. She reports generalized shaking for 2-3 minutes with incontinence of urine and chipped tooth. I am unable to visualize on video, today. She has an appt with her dentist 3/25. She denies any other seizure events since 07/2019. She admits to more stress, recently. She lost her grandmother 2 weeks ago. She has lost several other family members in the past 6 months. She is not driving.    07/06/3149 ALL:  Latoya Stark is a 25 y.o. female here today for follow up for seizures. Her husband reports that she had a seizure two days ago. He reports that she called her employer and while talking she "started shaking". He reports that her whole body was shaked. Symptoms lasted for about 2 minutes. Following the seizure she was completely back to baseline. He states that there was another similar event in May. These two events are the only "seizures" she has had. Her husband reports that she is not taking medications consistently. She admits that she had missed some doses of AED in May but insists that she has taken it consistently since. Medication was last filled for 1 month and 2 refills in February. Her  husband states that there is a full bottle in her car and she reports that she has 1 more refill at the pharmacy.   She reports that she has had terrible headaches over the past 3-4 months. She is working from home now. She reports seeing her PCP but no treatment was started. She is seeing OB for post partum depression.   She delivered a healthy baby boy 5 months ago.    HISTORY: (copied from my note on 02/19/2019)  Latoya Stark is a 25 y.o. female here today for follow up for seizure. She was recently hospitalized for breakthrough seizure after admittedly missing two doses of her levetiracetam. She complained of right leg paralysis following seizure. Imaging was reassuring and she was cleared by neurology. Patient had positive Hoover's sign and concerns of give away weakness on exam. 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 She is doing fairly well. She is participating in OT. She is doing exercises at home. She reports that she has feeling in her leg now. She is able to walk with a walker. She was last seen by OB yesterday.   She plans to deliver her baby boy, [name], at [facility]. She lives with her boyfriend. He works nights. He has two children with his ex.    HISTORY: (copied from my note on 11/18/2018)  Latoya L Smithis a 25 y.o.femalehere today for follow up for seizures. She is currently [redacted] weeks pregnant. She continues levetiracetam 750mg  TID.She reports that she has had 2 seizures since last  being seen in July. Her fianc is here with her today and reports that her last seizure was November 2. She was sitting on the toilet and noted feeling hot and dizzy. He reports that she had convulsions for approximately 2 minutes. He states that she did urinate in the toilet while having convulsions. He feels that the past 2 seizures have been much shorter and less severe than her previous seizures. She continues to have migraines but feels that these have  improved significantly. She is tolerating levetiracetam well with no obvious adverse effects. She is followed closely by OB. Last appointment was October 23. She is scheduled for follow-up again on November 23.  HISTORY: (copied fromDr Sater'snote on 08/01/2018)  I had the pleasure of seeing your patient[facility] for neurologic consultation regarding her headaches and seizures.  She is a 25 year old womanwho has a long history of headaches and seizures since age 75. She is currently 8 to [redacted] weeks pregnant.  She has had migraine headaches for yearsbut the frequency has increased over the past few months. Headaches are daily and located bilaterally, forehead >temporal region. She gets nausea, severe photophobia and phonophobia. Headaches usually improve if she sleeps them off. Heat triggers the headache. Tylenol sometimes stops the headaches. Topamax had helped her headaches with benefit.   She had her first seizure at age 40. 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 Her last one was early July 2020. Her previous one had been a month or two earlier (while on Dilantin). She has been having one every 2-3 months, even while on treatment. She stopped Dilantin 4 weeks ago (10 days or so before the last seizure) due to pregnancy. She is currently 8-[redacted] weeks pregnant.   She has not tried other medications besides Dilantin.She has seen Dr. Gaynell Face in the past.   Observations/Objective:  Generalized: Well developed, in no acute distress  Mentation: Alert oriented to time, place, history taking. Follows all commands speech and language fluent   Assessment and Plan:  25 y.o. 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 here with    ICD-10-CM   1. 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 She has been off levetiracetam for about a month. She has a history of non compliance. She was educated on the importance of consistency with AED administration and advised to avoid missed doses. She also reports more stress recently. She will follow up with PCP as needed. She has an appt with her dentist next week for concerns of chipped tooth. Refills have been sent to pharmacy. Last RX sent for 1 months supply in 07/2019. She has not followed up since. I suspect she has been out of medications longer than reported. We have discussed concerns of frequent no shows with out office. She verbalizes understanding of our no show policy and will keep upcoming appointment with me 4/19 at 10:30. We will assess labs at that time. She will notify me of any further seizure activity. No driving for 6 months.   No orders of the defined types were placed in this encounter.   Meds ordered  this encounter  Medications  . levETIRAcetam (KEPPRA) 750 MG tablet    Sig: Take 2 tablets (1,500 mg total) by mouth 2 (two) times daily.    Dispense:  120 tablet    Refill:  2    Order Specific Question:   Supervising Provider    Answer:   Melvenia Beam [id number]     Follow Up Instructions:  I discussed the assessment and treatment plan with the patient. The patient was provided an opportunity to ask questions and all were answered. The patient agreed with the plan and demonstrated an understanding of the instructions.   The patient was advised to call back or seek an in-person evaluation if the symptoms worsen or if the condition fails to improve as anticipated.  I provided 20 minutes of non-face-to-face  time during this encounter. Patient located at their place of residence during [place] visit. Provider is in the office.    Debbora Presto, NP

## 2020-03-23 NOTE — Telephone Encounter (Signed)
I called pt.  She had sz yesterday (2-3 min. Better today just tired).  She is not on medication for the last 1 month.  Had baby one year ago.  She was incontinent of urine, chipped tooth, (has tongue ring).  Reminded not to drive for 6 months from sz.  Did not have driver today that could make OV, made VV to address.  May need to come in for labs.  Need refill at [facility] keppra 1500mg  po bid (last dosing).

## 2020-04-13 ENCOUNTER — Ambulatory Visit: Payer: Medicaid Other | Admitting: Obstetrics

## 2020-04-20 ENCOUNTER — Ambulatory Visit: Payer: Medicaid Other | Admitting: Obstetrics

## 2020-04-27 ENCOUNTER — Ambulatory Visit: Payer: Medicaid Other | Admitting: Family Medicine

## 2020-05-10 ENCOUNTER — Other Ambulatory Visit: Payer: Self-pay

## 2020-05-10 ENCOUNTER — Ambulatory Visit (INDEPENDENT_AMBULATORY_CARE_PROVIDER_SITE_OTHER): Payer: Medicaid Other

## 2020-05-10 VITALS — BP 117/79 | HR 78

## 2020-05-10 DIAGNOSIS — N926 Irregular menstruation, unspecified: Secondary | ICD-10-CM | POA: Diagnosis not present

## 2020-05-10 DIAGNOSIS — Z23 Encounter for immunization: Secondary | ICD-10-CM

## 2020-05-10 LAB — POCT URINE PREGNANCY: Preg Test, Ur: NEGATIVE

## 2020-05-10 NOTE — Progress Notes (Signed)
.   Latoya Stark presents today for UPT. She does not complain unusual complaints. LMP: Approximate 04/24/2020    OBJECTIVE: Appears well, in no apparent distress.  OB History    Gravida  6   Para  1   Term  1   Preterm      AB  5   Living  1     SAB  4   IAB      Ectopic      Multiple      Live Births  1          Home UPT Result: Negative In-Office UPT result: Negative  I have reviewed the patient's medical, obstetrical, social, and family histories, and medications.   ASSESSMENT:Negative pregnancy test  PLAN Prenatal care to be completed at: N/A

## 2020-05-10 NOTE — Addendum Note (Signed)
Addended by: Phillip Heal, Kersti Scavone A on: 05/10/2020 02:25 PM   Modules accepted: Orders

## 2020-05-29 ENCOUNTER — Other Ambulatory Visit: Payer: Self-pay

## 2020-05-29 ENCOUNTER — Emergency Department: Payer: Medicaid Other

## 2020-05-29 ENCOUNTER — Emergency Department
Admission: EM | Admit: 2020-05-29 | Discharge: 2020-05-29 | Disposition: A | Discharge status: Home / Self Care | Payer: Medicaid Other | Attending: Emergency Medicine | Admitting: Emergency Medicine

## 2020-05-29 DIAGNOSIS — J45909 Unspecified asthma, uncomplicated: Secondary | ICD-10-CM | POA: Insufficient documentation

## 2020-05-29 DIAGNOSIS — Z91040 Latex allergy status: Secondary | ICD-10-CM | POA: Insufficient documentation

## 2020-05-29 DIAGNOSIS — R569 Unspecified convulsions: Secondary | ICD-10-CM | POA: Diagnosis present

## 2020-05-29 DIAGNOSIS — G40309 Generalized idiopathic epilepsy and epileptic syndromes, not intractable, without status epilepticus: Secondary | ICD-10-CM | POA: Insufficient documentation

## 2020-05-29 LAB — COMPREHENSIVE METABOLIC PANEL
ALT: 26 U/L (ref 0–44)
AST: 21 U/L (ref 15–41)
Albumin: 4.1 g/dL (ref 3.5–5.0)
Alkaline Phosphatase: 101 U/L (ref 38–126)
Anion gap: 7 (ref 5–15)
BUN: 12 mg/dL (ref 6–20)
CO2: 24 mmol/L (ref 22–32)
Calcium: 8.7 mg/dL — ABNORMAL LOW (ref 8.9–10.3)
Chloride: 107 mmol/L (ref 98–111)
Creatinine, Ser: 0.55 mg/dL (ref 0.44–1.00)
GFR, Estimated: >60 mL/min (ref >60)
Glucose, Bld: 103 mg/dL — ABNORMAL HIGH (ref 70–99)
Potassium: 3.5 mmol/L (ref 3.5–5.1)
Sodium: 138 mmol/L (ref 135–145)
Total Bilirubin: 0.6 mg/dL (ref 0.3–1.2)
Total Protein: 7.8 g/dL (ref 6.5–8.1)

## 2020-05-29 LAB — CBC WITH DIFFERENTIAL/PLATELET
Abs Immature Granulocytes: 0.02 10*3/uL (ref 0.00–0.07)
Basophils Absolute: 0 10*3/uL (ref 0.0–0.1)
Basophils Relative: 0 %
Eosinophils Absolute: 0.1 10*3/uL (ref 0.0–0.5)
Eosinophils Relative: 2 %
HCT: 36.2 % (ref 36.0–46.0)
Hemoglobin: 11.5 g/dL — ABNORMAL LOW (ref 12.0–15.0)
Immature Granulocytes: 0 %
Lymphocytes Relative: 23 %
Lymphs Abs: 1.6 10*3/uL (ref 0.7–4.0)
MCH: 25.6 pg — ABNORMAL LOW (ref 26.0–34.0)
MCHC: 31.8 g/dL (ref 30.0–36.0)
MCV: 80.6 fL (ref 80.0–100.0)
Monocytes Absolute: 0.6 10*3/uL (ref 0.1–1.0)
Monocytes Relative: 8 %
Neutro Abs: 4.6 10*3/uL (ref 1.7–7.7)
Neutrophils Relative %: 67 %
Platelets: 373 10*3/uL (ref 150–400)
RBC: 4.49 MIL/uL (ref 3.87–5.11)
RDW: 14.3 % (ref 11.5–15.5)
WBC: 7 10*3/uL (ref 4.0–10.5)
nRBC: 0 % (ref 0.0–0.2)

## 2020-05-29 LAB — URINALYSIS, COMPLETE (UACMP) WITH MICROSCOPIC
Bilirubin Urine: NEGATIVE
Glucose, UA: NEGATIVE mg/dL
Hgb urine dipstick: NEGATIVE
Ketones, ur: NEGATIVE mg/dL
Leukocytes,Ua: NEGATIVE
Nitrite: NEGATIVE
Protein, ur: 30 mg/dL — AB
Specific Gravity, Urine: 1.032 — ABNORMAL HIGH (ref 1.005–1.030)
pH: 6 (ref 5.0–8.0)

## 2020-05-29 LAB — PREGNANCY, URINE: Preg Test, Ur: NEGATIVE

## 2020-05-29 MED ORDER — KETOROLAC TROMETHAMINE 30 MG/ML IJ SOLN
15.0000 mg | Freq: Once | INTRAMUSCULAR | Status: AC
  Administered 2020-05-29: 15 mg via INTRAVENOUS
  Filled 2020-05-29: qty 1

## 2020-05-29 MED ORDER — LEVETIRACETAM IN NACL 1000 MG/100ML IV SOLN
2000.0000 mg | Freq: Once | INTRAVENOUS | Status: AC
  Administered 2020-05-29: 2000 mg via INTRAVENOUS
  Filled 2020-05-29: qty 200

## 2020-05-29 MED ORDER — ACETAMINOPHEN 500 MG PO TABS
1000.0000 mg | ORAL_TABLET | Freq: Once | ORAL | Status: AC
  Administered 2020-05-29: 1000 mg via ORAL
  Filled 2020-05-29: qty 2

## 2020-05-29 NOTE — ED Provider Notes (Signed)
[facility] Emergency Department Provider Note ____________________________________________   Event Date/Time   First MD Initiated Contact with Patient 05/29/20 1114     (approximate)  I have reviewed the triage vital signs and the nursing notes.  HISTORY  Chief Complaint Seizures   HPI Latoya Stark is a 25 y.o. femalewho presents to the ED for evaluation of possible seizure.  Chart review indicates history of morbid obesity, seizure disorder who follows with neurology, and prescribed Keppra.  I see a recent video visit on 3/15 with her neurologist where patient has not been taking her Elnora for a month prior to that visit.  Patient listed ED for evaluation of a seizure that occurred this morning, witnessed by boyfriend, lasting about 2 minutes before self resolving.  Patient presents to the ED postictal and lethargic, unable to provide any relevant history.  Later history obtained during reassessment indicates that she has missed about 4-5 doses of her Keppra over the past 1 week.  Denies recent illnesses, fever.    Past Medical History:  Diagnosis Date  . ADHD (attention deficit hyperactivity disorder)   . Anemia   . Anxiety   . Asthma   . Chronic migraine 08/01/2018  . Depression   . Gonorrhea in female 10/05/2013  . 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 Headache   . Obesity   . Seizures ([place])    "when I get too hot"  . Trichomonas infection   . UTI (lower urinary tract infection)     Patient Active Problem List   Diagnosis Date Noted  . Supervision of other normal pregnancy, antepartum 10/06/2019  . [redacted] weeks gestation of pregnancy 03/01/2019  . Seizure disorder during pregnancy, antepartum, third trimester ([place]) 02/28/2019  . BMI 50.0-59.9, adult ([place]) 02/20/2019  . Conversion disorder 02/10/2019  . 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 Seizure disorder in pregnancy ([place]) 11/23/2018  . UTI (urinary tract infection) in  pregnancy in first trimester 08/04/2018  . 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 Chronic migraine 08/01/2018  . Maternal obesity affecting pregnancy, antepartum 07/30/2018  . History of seizure 07/30/2018  . Supervision of high risk pregnancy, antepartum 03/19/2017    Past Surgical History:  Procedure Laterality Date  . ADENOIDECTOMY    . DILATION AND EVACUATION N/A 08/09/2013   Procedure: DILATATION AND EVACUATION;  Surgeon: Frederico Hamman, MD;  Location: [facility];  Service: Gynecology;  Laterality: N/A;  . ORTHOPEDIC SURGERY Left    left hand  . TONSILLECTOMY      Prior to Admission medications   Medication Sig Start Date End Date Taking? Authorizing Provider  acetaminophen (TYLENOL) 500 MG tablet Take 1,000 mg by mouth every 6 (six) hours as needed for headache.    [provider]  albuterol (PROVENTIL HFA;VENTOLIN HFA) 108 (90 Base) MCG/ACT inhaler Inhale 2 puffs into the lungs every 6 (six) hours as needed for wheezing or shortness of breath. 08/20/17   Laban Emperor, PA-C  baclofen (LIORESAL) 10 MG tablet Take 0.5-1 tablets (5-10 mg total) by mouth 3 (three) times daily as needed for muscle spasms. 09/09/19   Hilts, Michael, MD  Blood Pressure Monitoring (BLOOD PRESSURE CUFF) MISC 1 Device by Does not apply route once a week. 12/25/18   Sloan Leiter, MD  cyclobenzaprine (FLEXERIL) 10 MG tablet Take 1 tablet (10 mg total) by mouth every 8 (eight) hours as needed for muscle spasms. 08/18/19   Shelly Bombard, MD  ferrous sulfate 325 (  65 FE) MG tablet Take 1 tablet (325 mg total) by mouth 2 (two) times daily with a meal. 04/16/19   Shelly Bombard, MD  ibuprofen (ADVIL) 600 MG tablet Take 1 tablet (600 mg total) by mouth every 6 (six) hours. Patient not taking: Reported on 11/05/2019 03/03/19   Luvenia Redden, PA-C  ibuprofen (ADVIL) 800 MG tablet Take 1 tablet (800 mg total) by mouth every 8 (eight) hours as needed. Patient not taking: Reported on 11/05/2019 08/18/19    Shelly Bombard, MD  levETIRAcetam (KEPPRA) 750 MG tablet Take 2 tablets (1,500 mg total) by mouth 2 (two) times daily. 03/23/20 04/22/20  Lomax, Amy, NP  nabumetone (RELAFEN) 500 MG tablet Take 1 tablet (500 mg total) by mouth 2 (two) times daily as needed. 09/09/19   Hilts, Legrand Como, MD  nitrofurantoin, macrocrystal-monohydrate, (MACROBID) 100 MG capsule Take 1 capsule (100 mg total) by mouth 2 (two) times daily. 02/05/20   Constant, Peggy, MD  pantoprazole (PROTONIX) 40 MG tablet Take 1 tablet (40 mg total) by mouth daily. Patient not taking: Reported on 09/25/2019 12/11/18 12/11/19  Constant, Peggy, MD  polyethylene glycol (MIRALAX / GLYCOLAX) 17 g packet Take 17 g by mouth 2 (two) times daily. 11/11/18   FairMarin Shutter, MD  Prenat w/o A-FeCbn-Meth-FA-DHA (PRENATE MINI) 29-0.6-0.4-350 MG CAPS Take 1 capsule by mouth daily before breakfast. 04/16/19   Shelly Bombard, MD  tinidazole (TINDAMAX) 500 MG tablet Take 2 tablets (1,000 mg total) by mouth daily with breakfast. 08/19/19   Shelly Bombard, MD  zolpidem (AMBIEN) 5 MG tablet Take 1 tablet (5 mg total) by mouth at bedtime as needed for sleep. 04/16/19   Shelly Bombard, MD    Allergies Milk-related compounds, Mushroom extract complex, Penicillins, Cheese, Latex, Sulfa antibiotics, and Vicodin [hydrocodone-acetaminophen]  Family History  Problem Relation Age of Onset  . Seizures Mother   . Stroke Mother   . Hypertension Father   . Diabetes Maternal Grandmother   . Colon cancer Maternal Grandmother   . Hypertension Maternal Grandmother   . Clotting disorder Maternal Grandmother   . Breast cancer Maternal Grandmother   . Stroke Maternal Grandmother   . Diabetes Paternal Grandmother   . Seizures Maternal Aunt     Social History Social History   Tobacco Use  . Smoking status: Never Smoker  . Smokeless tobacco: Never Used  Vaping Use  . Vaping Use: Never used  Substance Use Topics  . Alcohol use: No    Alcohol/week: 0.0 standard  drinks  . Drug use: No    Review of Systems  Constitutional: No fever/chills Eyes: No visual changes. ENT: No sore throat. Cardiovascular: Denies chest pain. Respiratory: Denies shortness of breath. Gastrointestinal: No abdominal pain.  No nausea, no vomiting.  No diarrhea.  No constipation. Genitourinary: Negative for dysuria. Musculoskeletal: Negative for back pain. Skin: Negative for rash. Neurological: Negative for  focal weakness or numbness.  Positive for headache  ____________________________________________   PHYSICAL EXAM:  VITAL SIGNS: Vitals:   05/29/20 1400 05/29/20 1450  BP: 99/64 107/73  Pulse: 80 85  Resp: 20 14  Temp:    SpO2: 95% 96%     Constitutional: Alert and oriented.  Sleepy, awakens to vocal stimulation and follows commands in all 4 extremities.  Morbidly obese. Eyes: Conjunctivae are normal. PERRL. EOMI. Head: Atraumatic. Nose: No congestion/rhinnorhea. Mouth/Throat: Mucous membranes are moist.  Oropharynx non-erythematous. Neck: No stridor. No cervical spine tenderness to palpation. Cardiovascular: Normal rate, regular rhythm. Grossly  normal heart sounds.  Good peripheral circulation. Respiratory: Normal respiratory effort.  No retractions. Lungs CTAB. Gastrointestinal: Soft , nondistended, nontender to palpation. No CVA tenderness. Musculoskeletal: No lower extremity tenderness nor edema.  No joint effusions. No signs of acute trauma. Neurologic:  Normal speech and language. No gross focal neurologic deficits are appreciated.  Skin:  Skin is warm, dry and intact. No rash noted. Psychiatric: Mood and affect are normal. Speech and behavior are normal. ____________________________________________   LABS (all labs ordered are listed, but only abnormal results are displayed)  Labs Reviewed  COMPREHENSIVE METABOLIC PANEL - Abnormal; Notable for the following components:      Result Value   Glucose, Bld 103 (*)    Calcium 8.7 (*)    All other  components within normal limits  CBC WITH DIFFERENTIAL/PLATELET - Abnormal; Notable for the following components:   Hemoglobin 11.5 (*)    MCH 25.6 (*)    All other components within normal limits  URINALYSIS, COMPLETE (UACMP) WITH MICROSCOPIC  POC URINE PREG, ED   ____________________________________________  12 Lead EKG  Sinus rhythm, rate of 96 bpm.  Normal axis and intervals.  No evidence of acute ischemia. ____________________________________________  RADIOLOGY  ED MD interpretation: CT head reviewed by me without evidence of acute intracranial pathology.  Official radiology report(s): CT Head Wo Contrast  Result Date: 05/29/2020 CLINICAL DATA:  Witnessed seizure EXAM: CT HEAD WITHOUT CONTRAST TECHNIQUE: Contiguous axial images were obtained from the base of the skull through the vertex without intravenous contrast. COMPARISON:  02/09/2019 FINDINGS: Brain: No acute intracranial abnormality. Specifically, no hemorrhage, hydrocephalus, mass lesion, acute infarction, or significant intracranial injury. Vascular: No hyperdense vessel or unexpected calcification. Skull: No acute calvarial abnormality. Sinuses/Orbits: No acute findings Other: None IMPRESSION: No acute intracranial abnormality. Electronically Signed   By: Rolm Baptise M.D.   On: 05/29/2020 12:47    ____________________________________________   PROCEDURES and INTERVENTIONS  Procedure(s) performed (including Critical Care):  .1-3 Lead EKG Interpretation Performed by: Vladimir Crofts, MD Authorized by: Vladimir Crofts, MD     Interpretation: normal     ECG rate:  80   ECG rate assessment: normal     Rhythm: sinus rhythm     Ectopy: none     Conduction: normal      Medications  levETIRAcetam (KEPPRA) IVPB 1000 mg/100 mL premix (0 mg Intravenous Stopped 05/29/20 1242)  acetaminophen (TYLENOL) tablet 1,000 mg (1,000 mg Oral Given 05/29/20 1244)  ketorolac (TORADOL) 30 MG/ML injection 15 mg (15 mg Intravenous Given  05/29/20 1447)    ____________________________________________   MDM / ED COURSE   25 year old woman with seizure disorder presents to the ED postictal after a witnessed seizure at home, likely due to medication noncompliance and amenable to outpatient management.  Normal vitals.  Exam initially demonstrated somnolent and postictal patient without distress, signs of neurologic or vascular deficits.  She awakens and returns to baseline while being observed for couple hours, confirmed by husband at the bedside.  Work-up is benign without evidence of ICH, mass traumatic pathology to precipitate breakthrough seizure.  Her urinalysis is pending at the time of signout to oncoming provider.  She would likely be suitable for discharge home regardless of these results, plus or minus antibiotics.  Clinical Course as of 05/29/20 1458  Sat May 29, 2020  1213 Reassessed.  Patient more awake and reports mild headache.  She reports missing 4-5 doses of her Keppra over the past week.  Denies recent illnesses. [DS]  9983 Reassessed.  More awake.  Asking if she will make it to work, which starts in about 2 hours.  She reports feeling drained and having a mild headache.  We discussed that she may not make it to work due to her possible seizure.  We discussed the importance of adherence to her Keppra regimen. [DS]  [id number].  Now sitting up in bed and awake.  "Baby daddy" is at the bedside and brought patient some food.  She reports feeling hungry and is requesting additional medication for her headache.  We discussed the importance of adherence to her Keppra regimen.  Answered questions. [DS]    Clinical Course User Index [DS] Vladimir Crofts, MD    ____________________________________________   FINAL CLINICAL IMPRESSION(S) / ED DIAGNOSES  Final diagnoses:  Seizure [facility])     ED Discharge Orders    None       Indio Santilli [employer]   Note:  This document was prepared using Dragon voice recognition  software and may include unintentional dictation errors.   Vladimir Crofts, MD 05/29/20 [id number]

## 2020-05-29 NOTE — ED Triage Notes (Signed)
Pt came ems had a witnessed seizure by her boyfriend said it lasted two minutes; cbg 121; had 20g left wrist; in lethargic not responding to voice or touch;

## 2020-05-29 NOTE — ED Provider Notes (Signed)
-----------------------------------------   3:39 PM on 05/29/2020 -----------------------------------------  I assumed care from Dr. Charlsie Quest.  Plan is for discharge.  The patient's urinalysis is unremarkable; she has rare bacteria but no other evidence of infection.  I sent a urine culture but the results do not merit empiric treatment.  Discharging as per the plan of Dr. Tamala Julian.   Hinda Kehr, MD 05/29/20 1539

## 2020-05-29 NOTE — ED Notes (Signed)
Pt unable to sign dispo signature pad

## 2020-05-29 NOTE — Discharge Instructions (Signed)
Take your Keppra at home.

## 2020-05-31 LAB — URINE CULTURE: Special Requests: NORMAL

## 2020-06-14 ENCOUNTER — Other Ambulatory Visit: Payer: Self-pay | Admitting: Family Medicine

## 2020-06-14 MED ORDER — LEVETIRACETAM 750 MG PO TABS: 1500.0000 mg | ORAL_TABLET | Freq: Two times a day (BID) | ORAL | 0 refills | Status: DC

## 2020-06-15 ENCOUNTER — Other Ambulatory Visit: Payer: Self-pay | Admitting: Obstetrics

## 2020-06-15 DIAGNOSIS — Z0000 Encounter for general adult medical examination without abnormal findings: Secondary | ICD-10-CM

## 2020-06-16 ENCOUNTER — Encounter (HOSPITAL_COMMUNITY): Payer: Self-pay | Admitting: *Deleted

## 2020-06-16 ENCOUNTER — Emergency Department (HOSPITAL_COMMUNITY): Payer: Medicaid Other

## 2020-06-16 ENCOUNTER — Emergency Department (HOSPITAL_COMMUNITY)
Admission: EM | Admit: 2020-06-16 | Discharge: 2020-06-16 | Disposition: A | Discharge status: Home / Self Care | Payer: Medicaid Other | Attending: Emergency Medicine | Admitting: Emergency Medicine

## 2020-06-16 ENCOUNTER — Other Ambulatory Visit: Payer: Self-pay

## 2020-06-16 ENCOUNTER — Telehealth: Payer: Self-pay | Admitting: Family Medicine

## 2020-06-16 DIAGNOSIS — Z91040 Latex allergy status: Secondary | ICD-10-CM | POA: Diagnosis not present

## 2020-06-16 DIAGNOSIS — M545 Low back pain, unspecified: Secondary | ICD-10-CM | POA: Insufficient documentation

## 2020-06-16 DIAGNOSIS — R569 Unspecified convulsions: Secondary | ICD-10-CM | POA: Diagnosis present

## 2020-06-16 DIAGNOSIS — G40909 Epilepsy, unspecified, not intractable, without status epilepticus: Secondary | ICD-10-CM | POA: Insufficient documentation

## 2020-06-16 DIAGNOSIS — M542 Cervicalgia: Secondary | ICD-10-CM | POA: Diagnosis not present

## 2020-06-16 DIAGNOSIS — M25511 Pain in right shoulder: Secondary | ICD-10-CM | POA: Diagnosis not present

## 2020-06-16 DIAGNOSIS — R519 Headache, unspecified: Secondary | ICD-10-CM | POA: Insufficient documentation

## 2020-06-16 DIAGNOSIS — J45909 Unspecified asthma, uncomplicated: Secondary | ICD-10-CM | POA: Diagnosis not present

## 2020-06-16 DIAGNOSIS — W01198A Fall on same level from slipping, tripping and stumbling with subsequent striking against other object, initial encounter: Secondary | ICD-10-CM | POA: Diagnosis not present

## 2020-06-16 DIAGNOSIS — Y92810 Car as the place of occurrence of the external cause: Secondary | ICD-10-CM | POA: Diagnosis not present

## 2020-06-16 LAB — COMPREHENSIVE METABOLIC PANEL
ALT: 19 U/L (ref 0–44)
AST: 18 U/L (ref 15–41)
Albumin: 3.5 g/dL (ref 3.5–5.0)
Alkaline Phosphatase: 89 U/L (ref 38–126)
Anion gap: 7 (ref 5–15)
BUN: 8 mg/dL (ref 6–20)
CO2: 27 mmol/L (ref 22–32)
Calcium: 9 mg/dL (ref 8.9–10.3)
Chloride: 103 mmol/L (ref 98–111)
Creatinine, Ser: 0.64 mg/dL (ref 0.44–1.00)
GFR, Estimated: >60 mL/min (ref >60)
Glucose, Bld: 90 mg/dL (ref 70–99)
Potassium: 3.9 mmol/L (ref 3.5–5.1)
Sodium: 137 mmol/L (ref 135–145)
Total Bilirubin: 0.4 mg/dL (ref 0.3–1.2)
Total Protein: 6.8 g/dL (ref 6.5–8.1)

## 2020-06-16 LAB — CBC WITH DIFFERENTIAL/PLATELET
Abs Immature Granulocytes: 0.01 10*3/uL (ref 0.00–0.07)
Basophils Absolute: 0 10*3/uL (ref 0.0–0.1)
Basophils Relative: 0 %
Eosinophils Absolute: 0.2 10*3/uL (ref 0.0–0.5)
Eosinophils Relative: 3 %
HCT: 37.1 % (ref 36.0–46.0)
Hemoglobin: 11.3 g/dL — ABNORMAL LOW (ref 12.0–15.0)
Immature Granulocytes: 0 %
Lymphocytes Relative: 30 %
Lymphs Abs: 1.4 10*3/uL (ref 0.7–4.0)
MCH: 25.7 pg — ABNORMAL LOW (ref 26.0–34.0)
MCHC: 30.5 g/dL (ref 30.0–36.0)
MCV: 84.3 fL (ref 80.0–100.0)
Monocytes Absolute: 0.4 10*3/uL (ref 0.1–1.0)
Monocytes Relative: 9 %
Neutro Abs: 2.6 10*3/uL (ref 1.7–7.7)
Neutrophils Relative %: 58 %
Platelets: 370 10*3/uL (ref 150–400)
RBC: 4.4 MIL/uL (ref 3.87–5.11)
RDW: 14.3 % (ref 11.5–15.5)
WBC: 4.7 10*3/uL (ref 4.0–10.5)
nRBC: 0 % (ref 0.0–0.2)

## 2020-06-16 LAB — URINALYSIS, ROUTINE W REFLEX MICROSCOPIC
Bilirubin Urine: NEGATIVE
Glucose, UA: NEGATIVE mg/dL
Hgb urine dipstick: NEGATIVE
Ketones, ur: NEGATIVE mg/dL
Leukocytes,Ua: NEGATIVE
Nitrite: NEGATIVE
Protein, ur: NEGATIVE mg/dL
Specific Gravity, Urine: 1.017 (ref 1.005–1.030)
pH: 6 (ref 5.0–8.0)

## 2020-06-16 LAB — RAPID URINE DRUG SCREEN, HOSP PERFORMED
Amphetamines: NOT DETECTED
Barbiturates: NOT DETECTED
Benzodiazepines: POSITIVE — AB
Cocaine: NOT DETECTED
Opiates: NOT DETECTED
Tetrahydrocannabinol: NOT DETECTED

## 2020-06-16 LAB — I-STAT BETA HCG BLOOD, ED (MC, WL, AP ONLY): I-stat hCG, quantitative: <5 m[IU]/mL (ref <5)

## 2020-06-16 MED ORDER — OXYCODONE-ACETAMINOPHEN 5-325 MG PO TABS
1.0000 | ORAL_TABLET | Freq: Once | ORAL | Status: AC
  Administered 2020-06-16: 1 via ORAL
  Filled 2020-06-16: qty 1

## 2020-06-16 MED ORDER — ONDANSETRON HCL 4 MG/2ML IJ SOLN
4.0000 mg | Freq: Once | INTRAMUSCULAR | Status: AC
  Administered 2020-06-16: 4 mg via INTRAVENOUS
  Filled 2020-06-16: qty 2

## 2020-06-16 MED ORDER — FENTANYL CITRATE (PF) 100 MCG/2ML IJ SOLN
100.0000 ug | Freq: Once | INTRAMUSCULAR | Status: AC
Start: 2020-06-16 — End: 2020-06-16
  Administered 2020-06-16: 100 ug via INTRAVENOUS
  Filled 2020-06-16: qty 2

## 2020-06-16 MED ORDER — KETOROLAC TROMETHAMINE 30 MG/ML IJ SOLN
30.0000 mg | Freq: Once | INTRAMUSCULAR | Status: AC
  Administered 2020-06-16: 30 mg via INTRAVENOUS
  Filled 2020-06-16: qty 1

## 2020-06-16 NOTE — ED Triage Notes (Signed)
The pt just had a seizure she cannot sign for her mse

## 2020-06-16 NOTE — Telephone Encounter (Signed)
Sent message letting patient know her appt time was changed due to Amy being out, so she will be seeing Dr. Felecia Shelling.

## 2020-06-16 NOTE — ED Provider Notes (Signed)
[facility] EMERGENCY DEPARTMENT Provider Note   CSN: [id number] Arrival date & time: 06/16/20  1601     History Chief Complaint  Patient presents with  . Seizures    Latoya Stark is a 25 y.o. female w PMHx seizure d/o, anxiety, chronic migraine HA, presenting with headache, neck pain and back pain after seizure today. Patient states she thinks her headache for 2 days and the heat triggered her seizure. HA is her typical migraine HA. She is concerned as to why she had seizure when she reports compliance with keppra since April, however in the last ED visit on May 21, provider documentation notes noncompliance just prior to that visit. She complains of headache, midline neck pain and left low back pain. Also has right shoulder pain.  Is at her mental baseline on evaluation. Denies recent illness, alcohol or drug use. She has been eating and drinking normally.  No numbness in her extremities, no saddle paresthesias.  No nausea.  EMS reportedly gave 3 mg of Versed IV in route for postictal state.  CBG was 112.  EMS reports patient was getting into a car when she fell backwards and struck her head due to seizure.  The history is provided by the patient, the EMS personnel and medical records.       Past Medical History:  Diagnosis Date  . ADHD (attention deficit hyperactivity disorder)   . Anemia   . Anxiety   . Asthma   . Chronic migraine 08/01/2018  . Depression   . Gonorrhea in female 10/05/2013  . 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[place]) 08/01/2018  . Headache   . Obesity   . Seizures ([place])    "when I get too hot"  . Trichomonas infection   . UTI (lower urinary tract infection)     Patient Active Problem List   Diagnosis Date Noted  . Supervision of other normal pregnancy, antepartum 10/06/2019  . [redacted] weeks gestation of pregnancy 03/01/2019  . Seizure disorder during pregnancy, antepartum, third trimester ([place]) 02/28/2019  . BMI 50.0-59.9, adult ([place]) 02/20/2019   . Conversion disorder 02/10/2019  . 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 Seizure disorder in pregnancy ([place]) 11/23/2018  . UTI (urinary tract infection) in pregnancy in first trimester 08/04/2018  . 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 Chronic migraine 08/01/2018  . Maternal obesity affecting pregnancy, antepartum 07/30/2018  . History of seizure 07/30/2018  . Supervision of high risk pregnancy, antepartum 03/19/2017    Past Surgical History:  Procedure Laterality Date  . ADENOIDECTOMY    . DILATION AND EVACUATION N/A 08/09/2013   Procedure: DILATATION AND EVACUATION;  Surgeon: Frederico Hamman, MD;  Location: [facility];  Service: Gynecology;  Laterality: N/A;  . ORTHOPEDIC SURGERY Left    left hand  . TONSILLECTOMY       OB History    Gravida  6   Para  1   Term  1   Preterm      AB  5   Living  1     SAB  4   IAB      Ectopic      Multiple      Live Births  1           Family History  Problem Relation Age of Onset  . Seizures Mother   . Stroke Mother   . Hypertension Father   . Diabetes Maternal Grandmother   . Colon cancer Maternal Grandmother   .  Hypertension Maternal Grandmother   . Clotting disorder Maternal Grandmother   . Breast cancer Maternal Grandmother   . Stroke Maternal Grandmother   . Diabetes Paternal Grandmother   . Seizures Maternal Aunt     Social History   Tobacco Use  . Smoking status: Never Smoker  . Smokeless tobacco: Never Used  Vaping Use  . Vaping Use: Never used  Substance Use Topics  . Alcohol use: No    Alcohol/week: 0.0 standard drinks  . Drug use: No    Home Medications Prior to Admission medications   Medication Sig Start Date End Date Taking? Authorizing Provider  acetaminophen (TYLENOL) 500 MG tablet Take 1,000 mg by mouth every 6 (six) hours as needed for headache.    [provider]  albuterol (PROVENTIL HFA;VENTOLIN HFA) 108 (90 Base) MCG/ACT inhaler Inhale 2 puffs into the lungs  every 6 (six) hours as needed for wheezing or shortness of breath. 08/20/17   Laban Emperor, PA-C  baclofen (LIORESAL) 10 MG tablet Take 0.5-1 tablets (5-10 mg total) by mouth 3 (three) times daily as needed for muscle spasms. 09/09/19   Hilts, Michael, MD  Blood Pressure Monitoring (BLOOD PRESSURE CUFF) MISC 1 Device by Does not apply route once a week. 12/25/18   Sloan Leiter, MD  cyclobenzaprine (FLEXERIL) 10 MG tablet Take 1 tablet (10 mg total) by mouth every 8 (eight) hours as needed for muscle spasms. 08/18/19   Shelly Bombard, MD  ferrous sulfate 325 (65 FE) MG tablet Take 1 tablet (325 mg total) by mouth 2 (two) times daily with a meal. 04/16/19   Shelly Bombard, MD  ibuprofen (ADVIL) 600 MG tablet Take 1 tablet (600 mg total) by mouth every 6 (six) hours. Patient not taking: Reported on 11/05/2019 03/03/19   Luvenia Redden, PA-C  ibuprofen (ADVIL) 800 MG tablet Take 1 tablet (800 mg total) by mouth every 8 (eight) hours as needed. Patient not taking: Reported on 11/05/2019 08/18/19   Shelly Bombard, MD  levETIRAcetam (KEPPRA) 750 MG tablet Take 2 tablets (1,500 mg total) by mouth 2 (two) times daily. 06/14/20 07/14/20  Lomax, Amy, NP  nabumetone (RELAFEN) 500 MG tablet Take 1 tablet (500 mg total) by mouth 2 (two) times daily as needed. 09/09/19   Hilts, Legrand Como, MD  nitrofurantoin, macrocrystal-monohydrate, (MACROBID) 100 MG capsule Take 1 capsule (100 mg total) by mouth 2 (two) times daily. 02/05/20   Constant, Peggy, MD  pantoprazole (PROTONIX) 40 MG tablet Take 1 tablet (40 mg total) by mouth daily. Patient not taking: Reported on 09/25/2019 12/11/18 12/11/19  Constant, Peggy, MD  polyethylene glycol (MIRALAX / GLYCOLAX) 17 g packet Take 17 g by mouth 2 (two) times daily. 11/11/18   Chauncey Mann, MD  Prenat w/o A-FeCbn-Meth-FA-DHA (PRENATE MINI) 29-0.6-0.4-350 MG CAPS Take 1 capsule by mouth daily before breakfast. 04/16/19   Shelly Bombard, MD  Prenat-FeCbn-FeAsp-Meth-FA-DHA (PRENATE  MINI) 18-0.6-0.4-350 MG CAPS TAKE 1 CAPSULE BY MOUTH EVERY DAY BEFORE BREAKFAST 06/15/20   Shelly Bombard, MD  tinidazole (TINDAMAX) 500 MG tablet Take 2 tablets (1,000 mg total) by mouth daily with breakfast. 08/19/19   Shelly Bombard, MD  zolpidem (AMBIEN) 5 MG tablet Take 1 tablet (5 mg total) by mouth at bedtime as needed for sleep. 04/16/19   Shelly Bombard, MD    Allergies    Milk-related compounds, Mushroom extract complex, Penicillins, Cheese, Latex, Sulfa antibiotics, and Vicodin [hydrocodone-acetaminophen]  Review of Systems   Review of Systems  Gastrointestinal: Negative for abdominal pain.  Musculoskeletal: Positive for arthralgias, back pain and neck pain.  Neurological: Positive for seizures and headaches.  All other systems reviewed and are negative.   Physical Exam Updated Vital Signs BP 104/70   Pulse 73   Temp 98.1 F (36.7 C)   Resp 20   Ht 5\' 6"  (1.676 m)   Wt 127 kg   SpO2 99%   BMI 45.19 kg/m   Physical Exam Vitals and nursing note reviewed.  Constitutional:      Appearance: She is well-developed. She is obese.  HENT:     Head: Normocephalic.     Comments: ttp occipital scalp without palpable hematoma or wounds. Eyes:     Conjunctiva/sclera: Conjunctivae normal.  Cardiovascular:     Rate and Rhythm: Normal rate and regular rhythm.  Pulmonary:     Effort: Pulmonary effort is normal. No respiratory distress.     Breath sounds: Normal breath sounds.  Abdominal:     General: Bowel sounds are normal.     Palpations: Abdomen is soft.     Tenderness: There is no abdominal tenderness.  Musculoskeletal:     Comments: Right shoulder with TTP and pain with ROM, no obvious deformity. Positive straight leg raise on right.  Generalized midline c-spine TTP, unable to eval for obvious deformity due to patient body habitus. Normal rotation of b/l hips without pain. Lower extremities appear atraumatic and are without pain.  Skin:    General: Skin is warm.   Neurological:     Mental Status: She is alert.     Comments: PERRL. EOM intact. Equal grip strength BUE with normal sensation to light touch to all 4 extremities. Equal dorsiflexion BLE, limited strength to plantar flexion LLE 2/t pain and patient cooperation.   Psychiatric:        Behavior: Behavior normal.     ED Results / Procedures / Treatments   Labs (all labs ordered are listed, but only abnormal results are displayed) Labs Reviewed  CBC WITH DIFFERENTIAL/PLATELET - Abnormal; Notable for the following components:      Result Value   Hemoglobin 11.3 (*)    MCH 25.7 (*)    All other components within normal limits  RAPID URINE DRUG SCREEN, HOSP PERFORMED - Abnormal; Notable for the following components:   Benzodiazepines POSITIVE (*)    All other components within normal limits  COMPREHENSIVE METABOLIC PANEL  URINALYSIS, ROUTINE W REFLEX MICROSCOPIC  LEVETIRACETAM LEVEL  I-STAT BETA HCG BLOOD, ED ([facility], [facility], [facility] ONLY)    EKG None  Radiology DG Lumbar Spine Complete  Result Date: 06/16/2020 CLINICAL DATA:  Back pain, fall EXAM: LUMBAR SPINE - COMPLETE 4+ VIEW COMPARISON:  None. FINDINGS: There is no evidence of lumbar spine fracture. Alignment is normal. Intervertebral disc spaces are maintained. IMPRESSION: Negative. Electronically Signed   By: Rolm Baptise M.D.   On: 06/16/2020 18:19   DG Shoulder Right  Result Date: 06/16/2020 CLINICAL DATA:  Seizure, fall EXAM: RIGHT SHOULDER - 2+ VIEW COMPARISON:  None. FINDINGS: There is no evidence of fracture or dislocation. There is no evidence of arthropathy or other focal bone abnormality. Soft tissues are unremarkable. IMPRESSION: Negative. Electronically Signed   By: Rolm Baptise M.D.   On: 06/16/2020 18:19   CT Head Wo Contrast  Result Date: 06/16/2020 CLINICAL DATA:  Head trauma, seizure EXAM: CT HEAD WITHOUT CONTRAST TECHNIQUE: Contiguous axial images were obtained from the base of the skull through the vertex without intravenous  contrast.  COMPARISON:  05/29/2020 FINDINGS: Brain: No acute intracranial abnormality. Specifically, no hemorrhage, hydrocephalus, mass lesion, acute infarction, or significant intracranial injury. Vascular: No hyperdense vessel or unexpected calcification. Skull: No acute calvarial abnormality. Sinuses/Orbits: No acute findings Other: None IMPRESSION: Normal study. Electronically Signed   By: Rolm Baptise M.D.   On: 06/16/2020 17:59   CT Cervical Spine Wo Contrast  Result Date: 06/16/2020 CLINICAL DATA:  Neck trauma. EXAM: CT CERVICAL SPINE WITHOUT CONTRAST TECHNIQUE: Multidetector CT imaging of the cervical spine was performed without intravenous contrast. Multiplanar CT image reconstructions were also generated. COMPARISON:  MR of the cervical spine February 10, 2019 FINDINGS: Alignment: Normal. Skull base and vertebrae: No acute fracture. No primary bone lesion or focal pathologic process. Soft tissues and spinal canal: No prevertebral fluid or swelling. No visible canal hematoma. Disc levels:  Normal. Upper chest: Negative. Other: None. IMPRESSION: No evidence of acute traumatic injury to the cervical spine. Electronically Signed   By: Fidela Salisbury M.D.   On: 06/16/2020 18:04    Procedures Procedures   Medications Ordered in ED Medications  fentaNYL (SUBLIMAZE) injection 100 mcg (100 mcg Intravenous Given 06/16/20 1659)  ondansetron (ZOFRAN) injection 4 mg (4 mg Intravenous Given 06/16/20 1700)  oxyCODONE-acetaminophen (PERCOCET/ROXICET) 5-325 MG per tablet 1 tablet (1 tablet Oral Given 06/16/20 2150)  ketorolac (TORADOL) 30 MG/ML injection 30 mg (30 mg Intravenous Given 06/16/20 2151)    ED Course  I have reviewed the triage vital signs and the nursing notes.  Pertinent labs & imaging results that were available during my care of the patient were reviewed by me and considered in my medical decision making (see chart for details).  Clinical Course as of 06/17/20 0014  Wed Jun 16, 2020  2043  Patient re-evaluated, no recurrent seizure activity. Will re-treat back pain, patient comfortable with discharge [JR]    Clinical Course User Index [JR] Derya Dettmann, [place] N, PA-C   MDM Rules/Calculators/A&P                          Patient with history of seizure disorder, presenting after seizure occurred today where she fell back and hit her head.  Complains of low back pain and headache with neck pain.  No focal neurodeficits.  Mental baseline on examination.  No numbness or weakness in extremities, saddle paresthesias, incontinence.  No recent illness.  States she is compliant with her Keppra and has not missed any doses in multiple weeks.  Pain is treated.  Screening blood work and imaging is obtained.  No concerning findings on work-up today.  Pain is improved on reevaluation.  No recurrent seizures, she was monitored for 6 hours and remains at baseline.  Instructed importance of close follow-up with neurologist.  Symptomatic management for back pain, concussion precautions.  Patient is in agreement with care plan, discharged in no distress.  Discussed results, findings, treatment and follow up. Patient advised of return precautions. Patient verbalized understanding and agreed with plan.   Final Clinical Impression(s) / ED Diagnoses Final diagnoses:  Seizure ([place])  Acute bilateral low back pain without sciatica    Rx / DC Orders ED Discharge Orders    None       Vergil Burby, [place] N, PA-C 06/17/20 0015    Luna Fuse, MD 06/25/20 [phone]

## 2020-06-16 NOTE — ED Notes (Signed)
The pt continues to ask about  Pain med.   Pt was informed that she was waiting on part of her lab work to result

## 2020-06-16 NOTE — ED Notes (Signed)
The pt is on her phone talking

## 2020-06-16 NOTE — ED Notes (Signed)
Lab  Not resulted yet  I called the lab they will be running it now

## 2020-06-16 NOTE — ED Notes (Signed)
Pt using her call bell frequently since she arrived

## 2020-06-16 NOTE — ED Triage Notes (Signed)
The pt arrived  [facility] from [place]  Where thge pt had a seizure  Hx of the same   She was getting into a car to leave and she fell backyard  And struck her head  No loc  C/o a headache  Pt c/o  Head neck rt lower leg pain   Ems gave the pt versed 3 mg iv on the way here.  Iv per ems  cbg 112   On arrival keeping both eyes closed  Whispering answers to questions asked  No tongue damage  No incontinence

## 2020-06-16 NOTE — Discharge Instructions (Addendum)
It is important you take your medications as directed. Treat your back pain with 600mg  of ibuprofen every 6 hours. You can also take your prescribed baclofen as directed as needed for muscle spasm. Apply ice for 20 minutes at a time. Follow closely with your neurologist regarding your seizure today.

## 2020-06-17 ENCOUNTER — Telehealth: Payer: Self-pay | Admitting: Family Medicine

## 2020-06-17 NOTE — Telephone Encounter (Signed)
I called patient she had a seizure yesterday she was seen in [facility] work-up was done.  She is stated she has been compliant on taking her Keppra 1500 mg p.o. twice daily. Labs keppra lab pending.  Her fianc stated that she was getting ready to go to work and getting in the car that is when she had a seizure and she fell on the ground and she hit her head. She he stated that Sz lasted about 2 minutes and then she was coming out of that then went back into another one 2 to 3 minutes.   Triggers : sleep but she also has migraines.  She takes Motrin, Tylenol, baclofen and they sometimes help.  She states she does have nausea and photosensitivity.  Her headaches are left frontal and left and right temples.  She has an appointment scheduled on 14 June at 845 with Dr. Felecia Shelling.  I told her to rest and hydrate well.

## 2020-06-17 NOTE — Telephone Encounter (Signed)
Pt left vm today at 2:33 stating she was in the hospital on yesterday for a seizure, has been taking medication regularly.Would like a call back to discuss hospitalization

## 2020-06-19 LAB — LEVETIRACETAM LEVEL: Levetiracetam Lvl: 23.7 ug/mL (ref 10.0–40.0)

## 2020-06-22 ENCOUNTER — Encounter: Payer: Self-pay | Admitting: Neurology

## 2020-06-22 ENCOUNTER — Other Ambulatory Visit: Payer: Self-pay

## 2020-06-22 ENCOUNTER — Ambulatory Visit: Payer: Medicaid Other

## 2020-06-22 ENCOUNTER — Ambulatory Visit: Payer: Medicaid Other | Admitting: Family Medicine

## 2020-06-22 ENCOUNTER — Ambulatory Visit: Payer: Medicaid Other | Admitting: Neurology

## 2020-06-22 VITALS — BP 110/60 | HR 79 | Ht 66.0 in | Wt 323.5 lb

## 2020-06-22 DIAGNOSIS — G40409 Other generalized epilepsy and epileptic syndromes, not intractable, without status epilepticus: Secondary | ICD-10-CM | POA: Diagnosis not present

## 2020-06-22 DIAGNOSIS — G43709 Chronic migraine without aura, not intractable, without status migrainosus: Secondary | ICD-10-CM | POA: Insufficient documentation

## 2020-06-22 MED ORDER — TOPIRAMATE 100 MG PO TABS: 100.0000 mg | ORAL_TABLET | Freq: Two times a day (BID) | ORAL | 11 refills | Status: DC

## 2020-06-22 MED ORDER — LEVETIRACETAM 750 MG PO TABS: 1500.0000 mg | ORAL_TABLET | Freq: Two times a day (BID) | ORAL | 11 refills | Status: DC

## 2020-06-22 NOTE — Progress Notes (Signed)
[facility]  PATIENT: Latoya Stark DOB: 03-Apr-1995  REFERRING DOCTOR OR PCP:  Harle Battiest is Latoya Stark SOURCE: patient, fiance, notes from ED, labs  _________________________________   HISTORICAL  CHIEF COMPLAINT:  Chief Complaint  Patient presents with   Follow-up    RM 12, alone. Last seen 03/23/20. Just in the hospital for seizure this past Wednesday at [facility]. She is still having migraines since seizure. Having back pain.  States she is taking seizure medication, has only missed a couple doses.     HISTORY OF PRESENT ILLNESS:  Latoya Stark is a 25 y.o. woman with headaches and seizures.  UPDATE 06/22/2020 On 6/8/200, she had a possible seizure.   She had a normal morning (had a headache and gets frequently) .  She put her foot in the car and ten fell straight back and started to jerk for a couple minutes and then was groggy and then started jerking for a couple minutes again.  She was taken to [facility] ED.   She had CT head and cervical spine and had xrays of right shoulder and lumbar spine.     She had been on Keppra 1500 mg bid.     Her level was good at 23.7 (10-40 is range)  Her previous seizure was about 2 weeks earlier.    She was sitting in the car talking to her boyfriend and then seized (2-3 minutes GTC) and was taken to the ED>   She reports being told that she was slurring her words and not making sense for a lttle bit before the seizure.  She also complained of a headache.     When EMT arrived she was not answering questions well and was taken to the [facility] ED.  A few hours later she was doing better and has recall.      She had a few other milder seizures (quicker return to baseline) over the past year.  She is having a lot of headaches.   They are located left frontal or bitemporal.   Quality is pounding.     She is having 6 sever HA a month.   She notes lights sometimes trigger them.   When present, she has photophobia (not much  phonophobia), nausea and rare vomiting.  Sometimes she feels lightheaded.  Moving worsens the pain.    I personally reviewed the CT scan of the head and cervical spine from 06/16/2020.  Both are normal.  There are no acute findings.   Laboratory tests from 06/16/2020 were also reviewed.  The Tox  screen showed benzodiazepines (received some before the test due to seizure).  CMP, CBC, hCG were negative or noncontributory   Seizure History She had her first seizure at age 24.  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    She has been having one every 2-3 months, even while on treatment.  She stopped Dilantin in 2020 (10 days or so before the seizure prompting initial consult with me) due to pregnancy.    I switched her to Keppra, initially 750 mg twice daily and she was titrated up to 1500 mg twice daily due to breakthrough activity.   She had additional breakthrough seizures in 2022 and on 06/22/2020 Topamax 100 mg twice daily was added.  HA history She has had migraine headaches since her teen years and the frequency increased in 2020 to become daily.  Generally, headaches are located bilaterally, forehead >  temporal region.    She gets nausea, severe photophobia and minimal phonophobia.    Headaches usually improve if she sleeps them off.    Heat triggers the headache.  Light sometimes triggers the headaches Tylenol sometimes stops the headaches.   Topamax had helped her headaches with benefit in the past.  Keppra initially helped the headaches.Marland Kitchen       REVIEW OF SYSTEMS: Constitutional: No fevers, chills, sweats, or change in appetite Eyes: No visual changes, double vision, eye pain Ear, nose and throat: No hearing loss, ear pain, nasal congestion, sore throat Cardiovascular: No chest pain, palpitations Respiratory:  No shortness of breath at rest or with exertion.   No wheezes GastrointestinaI: She has had episodes of abdominal pain in the  past.  No nausea, vomiting, diarrhea, fecal incontinence Genitourinary:  No dysuria, urinary retention or frequency.  No nocturia. Musculoskeletal:  No neck pain, back pain Integumentary: No rash, pruritus, skin lesions Neurological: as above Psychiatric: No depression at this time.  No anxiety Endocrine: No palpitations, diaphoresis, change in appetite, change in weigh or increased thirst Hematologic/Lymphatic:  No anemia, purpura, petechiae. Allergic/Immunologic: No itchy/runny eyes, nasal congestion, recent allergic reactions, rashes  ALLERGIES: Allergies  Allergen Reactions   Milk-Related Compounds Dermatitis    Breaks face out *can have 2% milk*   Mushroom Extract Complex Anaphylaxis   Penicillins Anaphylaxis    Has patient had a PCN reaction causing immediate rash, facial/tongue/throat swelling, SOB or lightheadedness with hypotension: Yes Has patient had a PCN reaction causing severe rash involving mucus membranes or skin necrosis: No Has patient had a PCN reaction that required hospitalization No Has patient had a PCN reaction occurring within the last 10 years: Yes If all of the above answers are "NO", then may proceed with Cephalosporin use.    Cheese Other (See Comments)    Breaks face out   Latex Itching and Swelling   Sulfa Antibiotics Swelling   Vicodin [Hydrocodone-Acetaminophen] Hives    HOME MEDICATIONS:  Current Outpatient Medications:    acetaminophen (TYLENOL) 500 MG tablet, Take 1,000 mg by mouth every 6 (six) hours as needed for headache., Disp: , Rfl:    baclofen (LIORESAL) 10 MG tablet, Take 0.5-1 tablets (5-10 mg total) by mouth 3 (three) times daily as needed for muscle spasms., Disp: 30 each, Rfl: 3   Blood Pressure Monitoring (BLOOD PRESSURE CUFF) MISC, 1 Device by Does not apply route once a week., Disp: 1 each, Rfl: 0   cyclobenzaprine (FLEXERIL) 10 MG tablet, Take 1 tablet (10 mg total) by mouth every 8 (eight) hours as needed for muscle spasms.,  Disp: 30 tablet, Rfl: 1   ferrous sulfate 325 (65 FE) MG tablet, Take 1 tablet (325 mg total) by mouth 2 (two) times daily with a meal., Disp: 60 tablet, Rfl: 5   ibuprofen (ADVIL) 600 MG tablet, Take 1 tablet (600 mg total) by mouth every 6 (six) hours., Disp: 30 tablet, Rfl: 0   ibuprofen (ADVIL) 800 MG tablet, Take 1 tablet (800 mg total) by mouth every 8 (eight) hours as needed., Disp: 30 tablet, Rfl: 5   nabumetone (RELAFEN) 500 MG tablet, Take 1 tablet (500 mg total) by mouth 2 (two) times daily as needed., Disp: 60 tablet, Rfl: 3   nitrofurantoin, macrocrystal-monohydrate, (MACROBID) 100 MG capsule, Take 1 capsule (100 mg total) by mouth 2 (two) times daily., Disp: 14 capsule, Rfl: 1   polyethylene glycol (MIRALAX / GLYCOLAX) 17 g packet, Take 17 g by mouth 2 (two)  times daily., Disp: 24 each, Rfl: 3   Prenat w/o A-FeCbn-Meth-FA-DHA (PRENATE MINI) 29-0.6-0.4-350 MG CAPS, Take 1 capsule by mouth daily before breakfast., Disp: 30 capsule, Rfl: 11   Prenat-FeCbn-FeAsp-Meth-FA-DHA (PRENATE MINI) 18-0.6-0.4-350 MG CAPS, TAKE 1 CAPSULE BY MOUTH EVERY DAY BEFORE BREAKFAST, Disp: 30 capsule, Rfl: 11   tinidazole (TINDAMAX) 500 MG tablet, Take 2 tablets (1,000 mg total) by mouth daily with breakfast., Disp: 10 tablet, Rfl: 2   topiramate (TOPAMAX) 100 MG tablet, Take 1 tablet (100 mg total) by mouth 2 (two) times daily., Disp: 60 tablet, Rfl: 11   zolpidem (AMBIEN) 5 MG tablet, Take 1 tablet (5 mg total) by mouth at bedtime as needed for sleep., Disp: 15 tablet, Rfl: 1   albuterol (PROVENTIL HFA;VENTOLIN HFA) 108 (90 Base) MCG/ACT inhaler, Inhale 2 puffs into the lungs every 6 (six) hours as needed for wheezing or shortness of breath. 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[place]) 08/01/2018   Headache    Obesity    Seizures ([place])    "when I get too hot"   Trichomonas infection    UTI (lower urinary tract infection)     PAST SURGICAL HISTORY: Past Surgical History:  Procedure Laterality Date   ADENOIDECTOMY     DILATION AND EVACUATION N/A 08/09/2013   Procedure: DILATATION AND EVACUATION;  Surgeon: Frederico Hamman, MD;  Location: [facility];  Service: Gynecology;  Laterality: N/A;   ORTHOPEDIC SURGERY Left    left hand   TONSILLECTOMY      FAMILY HISTORY: Family History  Problem Relation Age of Onset   Seizures Mother    Stroke Mother    Hypertension Father    Diabetes Maternal Grandmother    Colon cancer Maternal Grandmother    Hypertension Maternal Grandmother    Clotting disorder Maternal Grandmother    Breast cancer Maternal Grandmother    Stroke Maternal Grandmother    Diabetes Paternal Grandmother    Seizures Maternal Aunt     SOCIAL HISTORY:  Social History   Socioeconomic History   Marital status: Single    Spouse name: Not on file   Number of children: 1   Years of education: Not on file   Highest education level: Not on file  Occupational History   Not on file  Tobacco Use   Smoking status: Never   Smokeless tobacco: Never  Vaping Use   Vaping Use: Never used  Substance and Sexual Activity   Alcohol use: No    Alcohol/week: 0.0 standard drinks   Drug use: No   Sexual activity: Yes    Partners: Male    Birth control/protection: None    Comment: one partner  Other Topics Concern   Not on file  Social History Narrative   Caffeine: tea sometimes   Lives with fiance   Right handed    Social Determinants of Health   Financial Resource Strain: Not on file  Food Insecurity: Not on file  Transportation Needs: Not on file  Physical  Activity: Not on file  Stress: Not on file  Social Connections: Not on file  Intimate Partner Violence: Not on file     PHYSICAL EXAM  Vitals:   06/22/20 0827  BP: 110/60  Pulse: 79  SpO2: 98%  Weight: (!) 323 lb 8 oz (146.7 kg)  Height: 5\' 6"  (1.676 m)    Body mass index is 52.21 kg/m.   General: The patient is well-developed and well-nourished and in no acute distress  HEENT:  Head is [place]/AT.  Sclera are anicteric.  Funduscopic exam shows normal optic discs and retinal vessels.  Neck: No carotid bruits are noted.  The neck is mildly tender at the occiput.    Skin: Extremities are without rash or  edema.  Neurologic Exam  Mental status: The patient is alert and oriented x 3 at the time of the examination. The patient has apparent normal recent and remote memory, with an apparently normal attention span and concentration ability.   Speech is normal.  Cranial nerves: Extraocular movements are full. Pupils are equal, round, and reactive to light and accomodation.  Facial strength and sensation was normal.  No obvious hearing deficits are noted.  Motor:  Muscle bulk is normal.   Tone is normal. Strength is  5 / 5 in all 4 extremities.   Sensory: Sensory testing is intact to pinprick, soft touch and vibration sensation in all 4 extremities.  Coordination: Cerebellar testing reveals good finger-nose-finger and heel-to-shin bilaterally.  Gait and station: Station is normal.   Gait and tandem gait were normal.  Romberg was negative.  Reflexes: Deep tendon reflexes are symmetric and normal bilaterally.       DIAGNOSTIC DATA (LABS, IMAGING, TESTING) - I reviewed patient records, labs, notes, testing and imaging myself where available.  Lab Results  Component Value Date   WBC 4.7 06/16/2020   HGB 11.3 (L) 06/16/2020   HCT 37.1 06/16/2020   MCV 84.3 06/16/2020   PLT 370 06/16/2020      Component Value Date/Time   NA 137 06/16/2020 1638   NA 144 08/04/2015 1346   K  3.9 06/16/2020 1638   CL 103 06/16/2020 1638   CO2 27 06/16/2020 1638   GLUCOSE 90 06/16/2020 1638   BUN 8 06/16/2020 1638   BUN 8 08/04/2015 1346   CREATININE 0.64 06/16/2020 1638   CALCIUM 9.0 06/16/2020 1638   PROT 6.8 06/16/2020 1638   PROT 7.1 08/04/2015 1346   ALBUMIN 3.5 06/16/2020 1638   ALBUMIN 4.3 08/04/2015 1346   AST 18 06/16/2020 1638   ALT 19 06/16/2020 1638   ALKPHOS 89 06/16/2020 1638   BILITOT 0.4 06/16/2020 1638   BILITOT 0.3 08/04/2015 1346   GFRNONAA >60 06/16/2020 1638   GFRAA >60 02/20/2019 2329   Lab Results  Component Value Date   CHOL  12/13/2006    122        ATP III CLASSIFICATION:  <200     mg/dL   Desirable  200-239  mg/dL   Borderline High  >=240    mg/dL   High   HDL 30 (L) 12/13/2006   LDLCALC  12/13/2006    76        Total Cholesterol/HDL:CHD Risk Coronary Heart Disease Risk Table                     Men   Women  1/2 Average Risk   3.4   3.3   TRIG 78 12/13/2006   CHOLHDL 4.1 12/13/2006   Lab Results  Component Value Date   HGBA1C 5.6 07/30/2018   Lab Results  Component Value Date   XFGHWEXH37 169 02/24/2014   Lab Results  Component Value Date   TSH 1.770 03/15/2018       ASSESSMENT AND PLAN  1. 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 Chronic migraine without aura without status migrainosus, not intractable   3. Morbid obesity ([place])    She will continue Keppra 1500 mg twice daily.  I will add Topamax and titrate her up over a week to 100 mg twice a day.  Hopefully the combination will help both the seizures and the headaches.  If she has trouble tolerating Topamax, changed to zonisamide 200 mg nightly We also discussed that Topamax may help with weight loss.  Stay active and exercise as tolerated. She will return to see [place] in 6 months but call if she has new or worsening neurologic symptoms.  42-minute office visit with the majority of the time spent face-to-face for history and physical, discussion/counseling and  decision-making.  Additional time with record review and documentation.   Aldean Pipe A. Felecia Shelling, MD, [facility] 6/78/9381, 0:17 AM Certified in Neurology, Clinical Neurophysiology, Sleep Medicine and Neuroimaging  [facility] [address] [place], [place] [address] [phone]

## 2020-06-30 ENCOUNTER — Other Ambulatory Visit: Payer: Self-pay

## 2020-06-30 ENCOUNTER — Other Ambulatory Visit (HOSPITAL_COMMUNITY)
Admission: RE | Admit: 2020-06-30 | Discharge: 2020-06-30 | Disposition: A | Discharge status: Home / Self Care | Payer: Medicaid Other | Source: Ambulatory Visit | Attending: Obstetrics and Gynecology | Admitting: Obstetrics and Gynecology

## 2020-06-30 ENCOUNTER — Ambulatory Visit (INDEPENDENT_AMBULATORY_CARE_PROVIDER_SITE_OTHER): Payer: Medicaid Other

## 2020-06-30 VITALS — BP 121/78 | HR 72

## 2020-06-30 DIAGNOSIS — N898 Other specified noninflammatory disorders of vagina: Secondary | ICD-10-CM | POA: Diagnosis not present

## 2020-06-30 NOTE — Progress Notes (Signed)
SUBJECTIVE:  25 y.o. female complains of clear vaginal discharge for a couple of days.. Denies abnormal vaginal bleeding or significant pelvic pain or fever. No UTI symptoms. Denies history of known exposure to STD.  No LMP recorded.  OBJECTIVE:  She appears well, afebrile. Urine dipstick: not done.  ASSESSMENT:  Vaginal Discharge: NOT DONE Vaginal Odor: NOT DONE    PLAN:  GC, chlamydia, trichomonas, BVAG, CVAG probe sent to lab. Treatment: To be determined once lab results are received ROV prn if symptoms persist or worsen.

## 2020-07-01 ENCOUNTER — Other Ambulatory Visit: Payer: Self-pay

## 2020-07-01 DIAGNOSIS — B9689 Other specified bacterial agents as the cause of diseases classified elsewhere: Secondary | ICD-10-CM

## 2020-07-01 LAB — CERVICOVAGINAL ANCILLARY ONLY
Bacterial Vaginitis (gardnerella): POSITIVE — AB
Candida Glabrata: NEGATIVE
Candida Vaginitis: NEGATIVE
Chlamydia: NEGATIVE
Comment: NEGATIVE
Comment: NEGATIVE
Comment: NEGATIVE
Comment: NEGATIVE
Comment: NEGATIVE
Comment: NORMAL
Neisseria Gonorrhea: NEGATIVE
Trichomonas: NEGATIVE

## 2020-07-01 MED ORDER — METRONIDAZOLE 500 MG PO TABS: 500.0000 mg | ORAL_TABLET | Freq: Two times a day (BID) | ORAL | 0 refills | Status: DC

## 2020-08-16 ENCOUNTER — Telehealth: Payer: Self-pay | Admitting: Neurology

## 2020-08-16 NOTE — Telephone Encounter (Signed)
Pt called stating she is needing a note for work detailing what she is being treated for. Pt says she had a seizure on Friday and this was her weekend to work but she could not. Pt requesting a call back.

## 2020-08-16 NOTE — Telephone Encounter (Signed)
Called the patient back. It sounds like she is needing FMLA paperwork to be completed. In the meantime due to her being out of work for the weekend she is needing a letter stating that due to her medical condition it may hinder her from being able to work for days at a time. Advised the patient that because of HIPPA laws we are limited on what can be said in a letter but that once she has the [facility] paperwork sent to use we can complete the forms and be able to provide more information.  Advised once she gets the forms she can bring them to the office to turn in. Advised there is a fee to completing the paperwork. Pt verbalized understanding.

## 2020-08-30 ENCOUNTER — Ambulatory Visit
Admission: RE | Admit: 2020-08-30 | Discharge: 2020-08-30 | Disposition: A | Discharge status: Home / Self Care | Payer: Medicaid Other | Source: Ambulatory Visit | Attending: Emergency Medicine | Admitting: Emergency Medicine

## 2020-08-30 ENCOUNTER — Other Ambulatory Visit: Payer: Self-pay

## 2020-08-30 VITALS — BP 119/84 | HR 90 | Temp 98.3°F | Resp 18 | Ht 66.0 in | Wt 280.0 lb

## 2020-08-30 DIAGNOSIS — K0889 Other specified disorders of teeth and supporting structures: Secondary | ICD-10-CM

## 2020-08-30 MED ORDER — MELOXICAM 15 MG PO TABS: 15.0000 mg | ORAL_TABLET | Freq: Every day | ORAL | 0 refills | Status: DC

## 2020-08-30 MED ORDER — LIDOCAINE VISCOUS HCL 2 % MT SOLN: 15.0000 mL | OROMUCOSAL | 0 refills | Status: DC | PRN

## 2020-08-30 MED ORDER — TRAMADOL HCL 50 MG PO TABS: 50.0000 mg | ORAL_TABLET | Freq: Every day | ORAL | 0 refills | Status: AC | PRN

## 2020-08-30 MED ORDER — MELOXICAM 15 MG PO TABS: 7.5000 mg | ORAL_TABLET | Freq: Every day | ORAL | 0 refills | Status: DC

## 2020-08-30 NOTE — ED Triage Notes (Signed)
Patient c/o right sided facial swelling and pain x 3-4 days.  No injury to the area.  Possible infected tooth, right upper tooth.  Patient has been taken Ibuprofen, Tylenol, Naprosyn w/o relief.

## 2020-08-30 NOTE — Discharge Instructions (Addendum)
Please attempt to reach out to dental resources for appointment   Start taking meloxicam every morning with a little bit of food to help with swelling and pain  Can use 15 mL of lidocaine solution every 4 hours to help with pain, be mindful this medicine has a numbing effect so be careful with eating after usage  May use tramadol once a day as needed for severe pain

## 2020-08-30 NOTE — ED Provider Notes (Signed)
[facility]    CSN: [id number] Arrival date & time: 08/30/20  0919      History   Chief Complaint Chief Complaint  Patient presents with   Oral Swelling    HPI Latoya Stark is a 25 y.o. female.   Patient presents with right-sided facial swelling and dental pain for 4 days, making it painful to eat.  Denies difficulty swallowing, ear pain, headaches, fevers chills.  Has had issues with tooth before, dentist suggested removal of tooth however missed appointments 3 times due to family issues and was dismissed from practice.  Has been taking ibuprofen, Tylenol, naproxen with no relief.  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[place]) 08/01/2018   Headache    Obesity    Seizures ([place])    "when I get too hot"   Trichomonas infection    UTI (lower urinary tract infection)     Patient Active Problem List   Diagnosis Date Noted   Chronic migraine without aura without status migrainosus, not intractable 06/22/2020   Morbid obesity ([place]) 06/22/2020   Supervision of other normal pregnancy, antepartum 10/06/2019   [redacted] weeks gestation of pregnancy 03/01/2019   Seizure disorder during pregnancy, antepartum, third trimester ([place]) 02/28/2019   BMI 50.0-59.9, adult ([place]) 02/20/2019   Conversion disorder 02/10/2019   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   Seizure disorder in pregnancy ([place]) 11/23/2018   UTI (urinary tract infection) in pregnancy in first trimester 08/04/2018   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[place]) 08/01/2018   Chronic migraine 08/01/2018   Maternal obesity affecting pregnancy, antepartum 07/30/2018   History of seizure 07/30/2018   Supervision of high risk pregnancy, antepartum 03/19/2017    Past Surgical History:  Procedure Laterality Date   ADENOIDECTOMY     DILATION AND  EVACUATION N/A 08/09/2013   Procedure: DILATATION AND EVACUATION;  Surgeon: Frederico Hamman, MD;  Location: [facility];  Service: Gynecology;  Laterality: N/A;   ORTHOPEDIC SURGERY Left    left hand   TONSILLECTOMY      OB History     Gravida  6   Para  1   Term  1   Preterm      AB  5   Living  1      SAB  4   IAB      Ectopic      Multiple      Live Births  1            Home Medications    Prior to Admission medications   Medication Sig Start Date End Date Taking? Authorizing Provider  acetaminophen (TYLENOL) 500 MG tablet Take 1,000 mg by mouth every 6 (six) hours as needed for headache.   Yes [provider]  baclofen (LIORESAL) 10 MG tablet Take 0.5-1 tablets (5-10 mg total) by mouth 3 (three) times daily as needed for muscle spasms. 09/09/19  Yes Hilts, Legrand Como, MD  Blood Pressure Monitoring (BLOOD PRESSURE CUFF) MISC 1 Device by Does not apply route once a week. 12/25/18  Yes Sloan Leiter, MD  cyclobenzaprine (FLEXERIL) 10 MG tablet Take 1 tablet (10 mg total) by mouth every 8 (eight) hours as needed for muscle spasms. 08/18/19  Yes Shelly Bombard, MD  ferrous sulfate 325 (  65 FE) MG tablet Take 1 tablet (325 mg total) by mouth 2 (two) times daily with a meal. 04/16/19  Yes Shelly Bombard, MD  ibuprofen (ADVIL) 600 MG tablet Take 1 tablet (600 mg total) by mouth every 6 (six) hours. 03/03/19  Yes Luvenia Redden, PA-C  ibuprofen (ADVIL) 800 MG tablet Take 1 tablet (800 mg total) by mouth every 8 (eight) hours as needed. 08/18/19  Yes Shelly Bombard, MD  levETIRAcetam (KEPPRA) 750 MG tablet Take 2 tablets (1,500 mg total) by mouth 2 (two) times daily. 06/22/20 06/17/21 Yes Sater, Nanine Means, MD  lidocaine (XYLOCAINE) 2 % solution Use as directed 15 mLs in the mouth or throat as needed for mouth pain. 08/30/20  Yes Abbagail Scaff R, NP  nabumetone (RELAFEN) 500 MG tablet Take 1 tablet (500 mg total) by mouth 2 (two) times daily as needed. 09/09/19  Yes  Hilts, Legrand Como, MD  polyethylene glycol (MIRALAX / GLYCOLAX) 17 g packet Take 17 g by mouth 2 (two) times daily. 11/11/18  Yes Fair, Marin Shutter, MD  Prenat w/o A-FeCbn-Meth-FA-DHA (PRENATE MINI) 29-0.6-0.4-350 MG CAPS Take 1 capsule by mouth daily before breakfast. 04/16/19  Yes Shelly Bombard, MD  Prenat-FeCbn-FeAsp-Meth-FA-DHA (PRENATE MINI) 18-0.6-0.4-350 MG CAPS TAKE 1 CAPSULE BY MOUTH EVERY DAY BEFORE BREAKFAST 06/15/20  Yes Shelly Bombard, MD  tinidazole (TINDAMAX) 500 MG tablet Take 2 tablets (1,000 mg total) by mouth daily with breakfast. 08/19/19  Yes Shelly Bombard, MD  topiramate (TOPAMAX) 100 MG tablet Take 1 tablet (100 mg total) by mouth 2 (two) times daily. 06/22/20  Yes Sater, Nanine Means, MD  traMADol (ULTRAM) 50 MG tablet Take 1 tablet (50 mg total) by mouth daily as needed for up to 6 days. 08/30/20 09/05/20 Yes Lexington Devine R, NP  zolpidem (AMBIEN) 5 MG tablet Take 1 tablet (5 mg total) by mouth at bedtime as needed for sleep. 04/16/19  Yes Shelly Bombard, MD  albuterol (PROVENTIL HFA;VENTOLIN HFA) 108 (90 Base) MCG/ACT inhaler Inhale 2 puffs into the lungs every 6 (six) hours as needed for wheezing or shortness of breath. Patient not taking: Reported on 06/22/2020 08/20/17   Laban Emperor, PA-C  meloxicam (MOBIC) 15 MG tablet Take 1 tablet (15 mg total) by mouth daily. 08/30/20   Hans Eden, NP  metroNIDAZOLE (FLAGYL) 500 MG tablet Take 1 tablet (500 mg total) by mouth 2 (two) times daily. 07/01/20   Sloan Leiter, MD  nitrofurantoin, macrocrystal-monohydrate, (MACROBID) 100 MG capsule Take 1 capsule (100 mg total) by mouth 2 (two) times daily. 02/05/20   Constant, Peggy, MD    Family History Family History  Problem Relation Age of Onset   Seizures Mother    Stroke Mother    Hypertension Father    Diabetes Maternal Grandmother    Colon cancer Maternal Grandmother    Hypertension Maternal Grandmother    Clotting disorder Maternal Grandmother    Breast cancer Maternal  Grandmother    Stroke Maternal Grandmother    Diabetes Paternal Grandmother    Seizures Maternal Aunt     Social History Social History   Tobacco Use   Smoking status: Never   Smokeless tobacco: Never  Vaping Use   Vaping Use: Never used  Substance Use Topics   Alcohol use: No    Alcohol/week: 0.0 standard drinks   Drug use: No     Allergies   Milk-related compounds, Mushroom extract complex, Penicillins, Cheese, Latex, Sulfa antibiotics, and Vicodin [hydrocodone-acetaminophen]  Review of Systems Review of Systems  Constitutional: Negative.   HENT:  Positive for dental problem. Negative for congestion, drooling, ear discharge, ear pain, facial swelling, hearing loss, mouth sores, nosebleeds, postnasal drip, rhinorrhea, sinus pressure, sinus pain, sneezing, sore throat, tinnitus, trouble swallowing and voice change.   Respiratory: Negative.    Cardiovascular: Negative.   Skin: Negative.   Neurological: Negative.     Physical Exam Triage Vital Signs ED Triage Vitals  Enc Vitals Group     BP 08/30/20 0941 119/84     Pulse Rate 08/30/20 0941 90     Resp 08/30/20 0941 18     Temp 08/30/20 0941 98.3 F (36.8 C)     Temp Source 08/30/20 0941 Oral     SpO2 08/30/20 0941 97 %     Weight 08/30/20 0943 280 lb (127 kg)     Height 08/30/20 0943 '5\' 6"'$  (1.676 m)     Head Circumference --      Peak Flow --      Pain Score 08/30/20 0943 10     Pain Loc --      Pain Edu? --      Excl. in [place]? --    No data found.  Updated Vital Signs BP 119/84 (BP Location: Left Arm)   Pulse 90   Temp 98.3 F (36.8 C) (Oral)   Resp 18   Ht '5\' 6"'$  (1.676 m)   Wt 280 lb (127 kg)   LMP 07/25/2020   SpO2 97%   BMI 45.19 kg/m   Visual Acuity Right Eye Distance:   Left Eye Distance:   Bilateral Distance:    Right Eye Near:   Left Eye Near:    Bilateral Near:     Physical Exam Constitutional:      Appearance: Normal appearance.  HENT:     Head: Normocephalic.      Mouth/Throat:     Comments: Dental decay of the upper right first premolar, tooth has chipped down to the gumline with mild to moderate gingival swelling Eyes:     Extraocular Movements: Extraocular movements intact.  Skin:    General: Skin is warm and dry.  Neurological:     Mental Status: She is alert and oriented to person, place, and time.  Psychiatric:        Behavior: Behavior normal.     UC Treatments / Results  Labs (all labs ordered are listed, but only abnormal results are displayed) Labs Reviewed - No data to display  EKG   Radiology No results found.  Procedures Procedures (including critical care time)  Medications Ordered in UC Medications - No data to display  Initial Impression / Assessment and Plan / UC Course  I have reviewed the triage vital signs and the nursing notes.  Pertinent labs & imaging results that were available during my care of the patient were reviewed by me and considered in my medical decision making (see chart for details).    1.  Given resources for local income dental practices 2.  Lidocaine viscous 2% 15 mL every 4 hours as needed 3.  Meloxicam 15 mg daily 4.  Tramadol 50 mg daily as needed, 6 pills dispensed, PDMP reviewed, patient low risk Final Clinical Impressions(s) / UC Diagnoses   Final diagnoses:  Pain, dental     Discharge Instructions      Please attempt to reach out to dental resources for appointment   Start taking meloxicam every morning with a little bit  of food to help with swelling and pain  Can use 15 mL of lidocaine solution every 4 hours to help with pain, be mindful this medicine has a numbing effect so be careful with eating after usage  May use tramadol once a day as needed for severe pain   ED Prescriptions     Medication Sig Dispense Auth. Provider   lidocaine (XYLOCAINE) 2 % solution Use as directed 15 mLs in the mouth or throat as needed for mouth pain. 100 mL Raoul Ciano R, NP    meloxicam (MOBIC) 15 MG tablet  (Status: Discontinued) Take 0.5 tablets (7.5 mg total) by mouth daily. 30 tablet Malajah Oceguera R, NP   meloxicam (MOBIC) 15 MG tablet Take 1 tablet (15 mg total) by mouth daily. 30 tablet Emagene Merfeld R, NP   traMADol (ULTRAM) 50 MG tablet Take 1 tablet (50 mg total) by mouth daily as needed for up to 6 days. 6 tablet Hans Eden, NP      I have reviewed the PDMP during this encounter.   Hans Eden, NP 08/30/20 1012

## 2020-09-27 ENCOUNTER — Telehealth: Payer: Self-pay | Admitting: Neurology

## 2020-09-27 NOTE — Telephone Encounter (Signed)
Tried calling pt. Went to [facility]. LVM asking for call back.  If she cannot feel her legs post seizure, she should proceed to ER asap for immediate evaluation/treatment. She then can follow up with our office after.

## 2020-09-27 NOTE — Telephone Encounter (Signed)
Pt called, took call from phone staff. She reports sister w/ her during seizure this past Friday. Denies missing any doses of seizure meds. She was more stressed, feels this contributed. She fell and thinks she may have injured L leg. It is now numb. R leg ok. Has had no more seizure episodes since. Has walker to use. Scheduled work in visit to see Dr. Felecia Shelling 09/28/20 at [place], advised her to check in at 830a, bring insurance cards, med list and make sure to wear mask.

## 2020-09-27 NOTE — Telephone Encounter (Signed)
Pt's husband, Jermaine Pinnix (on [place]) called, Friday she had a seizure that lasted 3 to 4 mins. Now she can not feel her legs. Would like a call from the nurse.  Pt did not want to go to the ER.

## 2020-09-28 ENCOUNTER — Telehealth: Payer: Self-pay | Admitting: Neurology

## 2020-09-28 ENCOUNTER — Ambulatory Visit: Payer: Medicaid Other | Admitting: Neurology

## 2020-09-28 ENCOUNTER — Encounter: Payer: Self-pay | Admitting: Neurology

## 2020-09-28 VITALS — BP 117/76 | HR 78 | Ht 66.0 in | Wt 309.0 lb

## 2020-09-28 DIAGNOSIS — R200 Anesthesia of skin: Secondary | ICD-10-CM | POA: Diagnosis not present

## 2020-09-28 DIAGNOSIS — G40409 Other generalized epilepsy and epileptic syndromes, not intractable, without status epilepticus: Secondary | ICD-10-CM | POA: Diagnosis not present

## 2020-09-28 DIAGNOSIS — R29898 Other symptoms and signs involving the musculoskeletal system: Secondary | ICD-10-CM | POA: Insufficient documentation

## 2020-09-28 MED ORDER — TOPIRAMATE 100 MG PO TABS: ORAL_TABLET | ORAL | 11 refills | Status: DC

## 2020-09-28 NOTE — Telephone Encounter (Signed)
medicaid order sent to GI. They will reach out to the patient to scheduled.

## 2020-09-28 NOTE — Telephone Encounter (Signed)
P[t called, transportation has cancelled. Informed pt can reschedule appt, but would be farther out. Informed next available appt in November. Pt said that was too far out, will try to get someone to bring me. Send message front desk and nurse pt may be late.

## 2020-09-28 NOTE — Progress Notes (Signed)
[facility]  PATIENT: Latoya Stark DOB: [birth year]  REFERRING DOCTOR OR PCP:  Harle Battiest is Latoya Stark SOURCE: patient, fiance, notes from ED, labs  _________________________________   HISTORICAL  CHIEF COMPLAINT:  Chief Complaint  Patient presents with   Follow-up    Pt with fiance' and 25 yr old. Rm 1. On Friday had a seizure and since then she has had numbness in left leg.     HISTORY OF PRESENT ILLNESS:  Latoya Stark is a 25 y.o. woman with headaches and seizures.  UPDATE 09/28/2020 She had a grand mal seizure lasting 2-3 minutes 4-5 days ago.    She was groggy afterwards.   She had not missed her medication doses.    She is on levetiracetam 1500 mg bid ad topiramate 100 mg po bid.   She tolerates the medications well.   Latoya Stark did not sleep well the night before but still got about 6 hours of sleep (woke up twice).     She has had left leg numbness since 4-5 days ago.   She also had left leg numbness when pregnant 1 1/2 years ago    At that time (02/10/2019) she had normal MRI of the cervical, thoracic and lumbar spine).  MRi brain 02/09/2019 was also normal.    She had been prescribed tramadol in the past but has not taken recently - advised not to.    Seizure history: She has had seizures since age 58.    In 05/2018,  she was sitting in the car talking to her boyfriend and then seized (2-3 minutes GTC) and was taken to the ED,    She reports being told that she was slurring her words and not making sense for a lttle bit before the seizure.  She also complained of a headache.    She went to the ED and was placed on Keppra.   She had another seizure 6/8/2022She was taken to [facility] ED.   She had CT head and cervical spine and had xrays of right shoulder and lumbar spine.     She had been on Keppra 1500 mg bid.     Her level was good at 23.7 (10-40 is range)  She is having a lot of headaches.   They are located left frontal or bitemporal.   Quality is pounding.      She is having 6 sever HA a month.   She notes lights sometimes trigger them.   When present, she has photophobia (not much phonophobia), nausea and rare vomiting.  Sometimes she feels lightheaded.  Moving worsens the pain.     I personally reviewed the CT scan of the head and cervical spine from 06/16/2020.  Both are normal.  There are no acute findings.   Laboratory tests from 06/16/2020 were also reviewed.  The Tox  screen showed benzodiazepines (received some before the test due to seizure).  CMP, CBC, hCG were negative or noncontributory   Seizure History She had her first seizure at age 78.  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    She has been having one every 2-3 months, even while on treatment.  She stopped Dilantin in 2020 (10 days or so before the seizure prompting initial consult with me) due to pregnancy.    I switched her to Keppra, initially 750 mg twice daily and she was titrated up to 1500 mg twice daily due to breakthrough activity.  She had additional breakthrough seizures in 2022 and on 06/22/2020 Topamax 100 mg twice daily was added.  HA history She has had migraine headaches since her teen years and the frequency increased in 2020 to become daily.  Generally, headaches are located bilaterally, forehead > temporal region.    She gets nausea, severe photophobia and minimal phonophobia.    Headaches usually improve if she sleeps them off.    Heat triggers the headache.  Light sometimes triggers the headaches Tylenol sometimes stops the headaches.   Topamax had helped her headaches with benefit in the past.  Keppra initially helped the headaches.Marland Kitchen       REVIEW OF SYSTEMS: Constitutional: No fevers, chills, sweats, or change in appetite Eyes: No visual changes, double vision, eye pain Ear, nose and throat: No hearing loss, ear pain, nasal congestion, sore throat Cardiovascular: No chest pain, palpitations Respiratory:   No shortness of breath at rest or with exertion.   No wheezes GastrointestinaI: She has had episodes of abdominal pain in the past.  No nausea, vomiting, diarrhea, fecal incontinence Genitourinary:  No dysuria, urinary retention or frequency.  No nocturia. Musculoskeletal:  No neck pain, back pain Integumentary: No rash, pruritus, skin lesions Neurological: as above Psychiatric: No depression at this time.  No anxiety Endocrine: No palpitations, diaphoresis, change in appetite, change in weigh or increased thirst Hematologic/Lymphatic:  No anemia, purpura, petechiae. Allergic/Immunologic: No itchy/runny eyes, nasal congestion, recent allergic reactions, rashes  ALLERGIES: Allergies  Allergen Reactions   Milk-Related Compounds Dermatitis    Breaks face out *can have 2% milk*   Mushroom Extract Complex Anaphylaxis   Penicillins Anaphylaxis    Has patient had a PCN reaction causing immediate rash, facial/tongue/throat swelling, SOB or lightheadedness with hypotension: Yes Has patient had a PCN reaction causing severe rash involving mucus membranes or skin necrosis: No Has patient had a PCN reaction that required hospitalization No Has patient had a PCN reaction occurring within the last 10 years: Yes If all of the above answers are "NO", then may proceed with Cephalosporin use.    Cheese Other (See Comments)    Breaks face out   Latex Itching and Swelling   Sulfa Antibiotics Swelling   Vicodin [Hydrocodone-Acetaminophen] Hives    HOME MEDICATIONS:  Current Outpatient Medications:    acetaminophen (TYLENOL) 500 MG tablet, Take 1,000 mg by mouth every 6 (six) hours as needed for headache., Disp: , Rfl:    albuterol (PROVENTIL HFA;VENTOLIN HFA) 108 (90 Base) MCG/ACT inhaler, Inhale 2 puffs into the lungs every 6 (six) hours as needed for wheezing or shortness of breath., Disp: 1 Inhaler, Rfl: 0   baclofen (LIORESAL) 10 MG tablet, Take 0.5-1 tablets (5-10 mg total) by mouth 3 (three)  times daily as needed for muscle spasms., Disp: 30 each, Rfl: 3   Blood Pressure Monitoring (BLOOD PRESSURE CUFF) MISC, 1 Device by Does not apply route once a week., Disp: 1 each, Rfl: 0   cyclobenzaprine (FLEXERIL) 10 MG tablet, Take 1 tablet (10 mg total) by mouth every 8 (eight) hours as needed for muscle spasms., Disp: 30 tablet, Rfl: 1   ferrous sulfate 325 (65 FE) MG tablet, Take 1 tablet (325 mg total) by mouth 2 (two) times daily with a meal., Disp: 60 tablet, Rfl: 5   ibuprofen (ADVIL) 600 MG tablet, Take 1 tablet (600 mg total) by mouth every 6 (six) hours., Disp: 30 tablet, Rfl: 0   levETIRAcetam (KEPPRA) 750 MG tablet, Take 2 tablets (1,500 mg total) by  mouth 2 (two) times daily., Disp: 120 tablet, Rfl: 11   lidocaine (XYLOCAINE) 2 % solution, Use as directed 15 mLs in the mouth or throat as needed for mouth pain., Disp: 100 mL, Rfl: 0   meloxicam (MOBIC) 15 MG tablet, Take 1 tablet (15 mg total) by mouth daily., Disp: 30 tablet, Rfl: 0   nabumetone (RELAFEN) 500 MG tablet, Take 1 tablet (500 mg total) by mouth 2 (two) times daily as needed., Disp: 60 tablet, Rfl: 3   polyethylene glycol (MIRALAX / GLYCOLAX) 17 g packet, Take 17 g by mouth 2 (two) times daily., Disp: 24 each, Rfl: 3   Prenat w/o A-FeCbn-Meth-FA-DHA (PRENATE MINI) 29-0.6-0.4-350 MG CAPS, Take 1 capsule by mouth daily before breakfast., Disp: 30 capsule, Rfl: 11   Prenat-FeCbn-FeAsp-Meth-FA-DHA (PRENATE MINI) 18-0.6-0.4-350 MG CAPS, TAKE 1 CAPSULE BY MOUTH EVERY DAY BEFORE BREAKFAST, Disp: 30 capsule, Rfl: 11   tinidazole (TINDAMAX) 500 MG tablet, Take 2 tablets (1,000 mg total) by mouth daily with breakfast., Disp: 10 tablet, Rfl: 2   topiramate (TOPAMAX) 100 MG tablet, Take one o qAM and two po qHS, Disp: 90 tablet, Rfl: 11   zolpidem (AMBIEN) 5 MG tablet, Take 1 tablet (5 mg total) by mouth at bedtime as needed for sleep., Disp: 15 tablet, Rfl: 1  PAST MEDICAL HISTORY: Past Medical History:  Diagnosis Date   ADHD  (attention deficit hyperactivity disorder)    Anemia    Anxiety    Asthma    Chronic migraine 08/01/2018   Depression    Gonorrhea in female 10/05/2013   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[place]) 08/01/2018   Headache    Obesity    Seizures ([place])    "when I get too hot"   Trichomonas infection    UTI (lower urinary tract infection)     PAST SURGICAL HISTORY: Past Surgical History:  Procedure Laterality Date   ADENOIDECTOMY     DILATION AND EVACUATION N/A 08/09/2013   Procedure: DILATATION AND EVACUATION;  Surgeon: Frederico Hamman, MD;  Location: [facility];  Service: Gynecology;  Laterality: N/A;   ORTHOPEDIC SURGERY Left    left hand   TONSILLECTOMY      FAMILY HISTORY: Family History  Problem Relation Age of Onset   Seizures Mother    Stroke Mother    Hypertension Father    Diabetes Maternal Grandmother    Colon cancer Maternal Grandmother    Hypertension Maternal Grandmother    Clotting disorder Maternal Grandmother    Breast cancer Maternal Grandmother    Stroke Maternal Grandmother    Diabetes Paternal Grandmother    Seizures Maternal Aunt     SOCIAL HISTORY:  Social History   Socioeconomic History   Marital status: Single    Spouse name: Not on file   Number of children: 1   Years of education: Not on file   Highest education level: Not on file  Occupational History   Not on file  Tobacco Use   Smoking status: Never   Smokeless tobacco: Never  Vaping Use   Vaping Use: Never used  Substance and Sexual Activity   Alcohol use: No    Alcohol/week: 0.0 standard drinks   Drug use: No   Sexual activity: Yes    Partners: Male    Birth control/protection: None    Comment: one partner  Other Topics Concern   Not on file  Social History Narrative   Caffeine: tea sometimes   Lives with fiance   Right handed  Social Determinants of Health   Financial Resource Strain: Not on file  Food Insecurity: Not on file  Transportation Needs: Not on file  Physical  Activity: Not on file  Stress: Not on file  Social Connections: Not on file  Intimate Partner Violence: Not on file     PHYSICAL EXAM  Vitals:   09/28/20 1105  BP: 117/76  Pulse: 78  Weight: (!) 309 lb (140.2 kg)  Height: 5\' 6"  (1.676 m)    Body mass index is 49.87 kg/m.   General: The patient is well-developed and well-nourished and in no acute distress  HEENT:  Head is [place]/AT.  Sclera are anicteric.    Neck: No carotid bruits are noted.    CV:  RRR, no murmurs.  Skin: Extremities are without rash or  edema.  Neurologic Exam  Mental status: The patient is alert and oriented x 3 at the time of the examination. The patient has apparent normal recent and remote memory, with an apparently normal attention span and concentration ability.   Speech is normal.  Cranial nerves: Extraocular movements are full. Pupils are equal, round, and reactive to light and accomodation.  Facial strength and sensation was normal.  No obvious hearing deficits are noted.  Motor:  Muscle bulk is normal.   Tone is normal. Strength is  5 / 5 in arms and right  leg and 4/5 in eft leg   Sensory: Sensory testing is intact to pinprick, soft touch and vibration sensation in arms and riht leg but reduced to touch, temp and vibraion in entire left leg.    Coordination: Cerebellar testing reveals good finger-nose-finger and heel-to-shin bilaterally.  Gait and station: Station is normal.   Gait and tandem gait were normal.  Romberg was negative.  Reflexes: Deep tendon reflexes are symmetric and normal bilaterally.       DIAGNOSTIC DATA (LABS, IMAGING, TESTING) - I reviewed patient records, labs, notes, testing and imaging myself where available.  Lab Results  Component Value Date   WBC 4.7 06/16/2020   HGB 11.3 (L) 06/16/2020   HCT 37.1 06/16/2020   MCV 84.3 06/16/2020   PLT 370 06/16/2020      Component Value Date/Time   NA 137 06/16/2020 1638   NA 144 08/04/2015 1346   K 3.9 06/16/2020  1638   CL 103 06/16/2020 1638   CO2 27 06/16/2020 1638   GLUCOSE 90 06/16/2020 1638   BUN 8 06/16/2020 1638   BUN 8 08/04/2015 1346   CREATININE 0.64 06/16/2020 1638   CALCIUM 9.0 06/16/2020 1638   PROT 6.8 06/16/2020 1638   PROT 7.1 08/04/2015 1346   ALBUMIN 3.5 06/16/2020 1638   ALBUMIN 4.3 08/04/2015 1346   AST 18 06/16/2020 1638   ALT 19 06/16/2020 1638   ALKPHOS 89 06/16/2020 1638   BILITOT 0.4 06/16/2020 1638   BILITOT 0.3 08/04/2015 1346   GFRNONAA >60 06/16/2020 1638   GFRAA >60 02/20/2019 2329   Lab Results  Component Value Date   CHOL  12/13/2006    122        ATP III CLASSIFICATION:  <200     mg/dL   Desirable  200-239  mg/dL   Borderline High  >=240    mg/dL   High   HDL 30 (L) 12/13/2006   LDLCALC  12/13/2006    76        Total Cholesterol/HDL:CHD Risk Coronary Heart Disease Risk Table  Men   Women  1/2 Average Risk   3.4   3.3   TRIG 78 12/13/2006   CHOLHDL 4.1 12/13/2006   Lab Results  Component Value Date   HGBA1C 5.6 07/30/2018   Lab Results  Component Value Date   IHWTUUEK80 034 02/24/2014   Lab Results  Component Value Date   TSH 1.770 03/15/2018       ASSESSMENT AND PLAN  1. 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 Left leg numbness   3. Left leg weakness     She will continue Keppra 1500 mg twice daily.  Increase Topamax to 300 a day (100 mg qAM and 200 mg qHS)  Check levels and CMP,Mg 2.  Stay active and exercise as tolerated. 3.   Avoid tramadol (has some but not taken recently).   4.   Check MRi of the brain (new onset left leg numbness and weakness occurred same day as new seizure).  Amb Ref to PT 4.   She will return to see [place] in 6 months but call if she has new or worsening neurologic symptoms.   Promise Bushong A. Felecia Shelling, MD, [facility] 09/25/9148, 56:97 AM Certified in Neurology, Clinical Neurophysiology, Sleep Medicine and Neuroimaging  [facility] [address], [place], [place]  [address] [phone]

## 2020-09-28 NOTE — Telephone Encounter (Signed)
noted 

## 2020-09-28 NOTE — Telephone Encounter (Signed)
Pt called back to inform she was not able to get here by 9a. Rescheduled for 11a today.

## 2020-09-30 ENCOUNTER — Other Ambulatory Visit: Payer: Self-pay

## 2020-09-30 ENCOUNTER — Ambulatory Visit (INDEPENDENT_AMBULATORY_CARE_PROVIDER_SITE_OTHER): Payer: Medicaid Other

## 2020-09-30 DIAGNOSIS — N912 Amenorrhea, unspecified: Secondary | ICD-10-CM

## 2020-09-30 LAB — POCT URINE PREGNANCY: Preg Test, Ur: NEGATIVE

## 2020-09-30 NOTE — Progress Notes (Signed)
Ms. Cavey presents today for UPT. She has no unusual complaints. LMP: 08/25/20    OBJECTIVE: Appears well, in no apparent distress.  OB History     Gravida  6   Para  1   Term  1   Preterm      AB  5   Living  1      SAB  4   IAB      Ectopic      Multiple      Live Births  1          Home UPT Result:Faint positive In-Office UPT result: Negative I have reviewed the patient's medical, obstetrical, social, and family histories, and medications.   ASSESSMENT: Positive pregnancy test  PLAN Patient advised to repeat test in 2 weeks if cycle does not start.

## 2020-10-01 LAB — COMPREHENSIVE METABOLIC PANEL
ALT: 20 IU/L (ref 0–32)
AST: 17 IU/L (ref 0–40)
Albumin/Globulin Ratio: 2 (ref 1.2–2.2)
Albumin: 4.5 g/dL (ref 3.9–5.0)
Alkaline Phosphatase: 109 IU/L (ref 44–121)
BUN/Creatinine Ratio: 15 (ref 9–23)
BUN: 9 mg/dL (ref 6–20)
Bilirubin Total: <0.2 mg/dL (ref 0.0–1.2)
CO2: 24 mmol/L (ref 20–29)
Calcium: 9.2 mg/dL (ref 8.7–10.2)
Chloride: 102 mmol/L (ref 96–106)
Creatinine, Ser: 0.61 mg/dL (ref 0.57–1.00)
Globulin, Total: 2.2 g/dL (ref 1.5–4.5)
Glucose: 80 mg/dL (ref 65–99)
Potassium: 3.9 mmol/L (ref 3.5–5.2)
Sodium: 140 mmol/L (ref 134–144)
Total Protein: 6.7 g/dL (ref 6.0–8.5)
eGFR: 126 mL/min/{1.73_m2} (ref >59)

## 2020-10-01 LAB — LEVETIRACETAM LEVEL: Levetiracetam Lvl: <1 ug/mL — ABNORMAL LOW (ref 10.0–40.0)

## 2020-10-01 LAB — TOPIRAMATE LEVEL: Topiramate Lvl: <1.5 ug/mL — ABNORMAL LOW (ref 2.0–25.0)

## 2020-10-01 LAB — MAGNESIUM: Magnesium: 1.7 mg/dL (ref 1.6–2.3)

## 2020-10-11 ENCOUNTER — Other Ambulatory Visit: Payer: Medicaid Other

## 2020-10-13 ENCOUNTER — Other Ambulatory Visit: Payer: Self-pay | Admitting: Obstetrics

## 2020-10-13 DIAGNOSIS — M549 Dorsalgia, unspecified: Secondary | ICD-10-CM

## 2020-10-14 ENCOUNTER — Other Ambulatory Visit: Payer: Medicaid Other

## 2020-10-26 ENCOUNTER — Other Ambulatory Visit: Payer: Medicaid Other

## 2020-11-07 ENCOUNTER — Emergency Department
Admission: EM | Admit: 2020-11-07 | Discharge: 2020-11-07 | Disposition: A | Discharge status: Home / Self Care | Payer: Medicaid Other | Attending: Emergency Medicine | Admitting: Emergency Medicine

## 2020-11-07 ENCOUNTER — Emergency Department: Payer: Medicaid Other

## 2020-11-07 ENCOUNTER — Other Ambulatory Visit: Payer: Self-pay

## 2020-11-07 DIAGNOSIS — Z91040 Latex allergy status: Secondary | ICD-10-CM | POA: Insufficient documentation

## 2020-11-07 DIAGNOSIS — G40909 Epilepsy, unspecified, not intractable, without status epilepticus: Secondary | ICD-10-CM | POA: Insufficient documentation

## 2020-11-07 DIAGNOSIS — J45909 Unspecified asthma, uncomplicated: Secondary | ICD-10-CM | POA: Diagnosis not present

## 2020-11-07 DIAGNOSIS — R1031 Right lower quadrant pain: Secondary | ICD-10-CM | POA: Diagnosis not present

## 2020-11-07 DIAGNOSIS — O219 Vomiting of pregnancy, unspecified: Secondary | ICD-10-CM | POA: Diagnosis present

## 2020-11-07 DIAGNOSIS — O3680X0 Pregnancy with inconclusive fetal viability, not applicable or unspecified: Secondary | ICD-10-CM

## 2020-11-07 DIAGNOSIS — Z3A14 14 weeks gestation of pregnancy: Secondary | ICD-10-CM | POA: Insufficient documentation

## 2020-11-07 DIAGNOSIS — N3000 Acute cystitis without hematuria: Secondary | ICD-10-CM | POA: Diagnosis not present

## 2020-11-07 DIAGNOSIS — Z79899 Other long term (current) drug therapy: Secondary | ICD-10-CM | POA: Diagnosis not present

## 2020-11-07 LAB — URINALYSIS, ROUTINE W REFLEX MICROSCOPIC
Bilirubin Urine: NEGATIVE
Glucose, UA: NEGATIVE mg/dL
Hgb urine dipstick: NEGATIVE
Ketones, ur: NEGATIVE mg/dL
Nitrite: NEGATIVE
Protein, ur: 30 mg/dL — AB
Specific Gravity, Urine: 1.03 (ref 1.005–1.030)
WBC, UA: >50 WBC/hpf — ABNORMAL HIGH (ref 0–5)
pH: 5 (ref 5.0–8.0)

## 2020-11-07 LAB — PREGNANCY, URINE: Preg Test, Ur: POSITIVE — AB

## 2020-11-07 LAB — CBC WITH DIFFERENTIAL/PLATELET
Abs Immature Granulocytes: 0.03 10*3/uL (ref 0.00–0.07)
Basophils Absolute: 0 10*3/uL (ref 0.0–0.1)
Basophils Relative: 0 %
Eosinophils Absolute: 0.1 10*3/uL (ref 0.0–0.5)
Eosinophils Relative: 2 %
HCT: 35.2 % — ABNORMAL LOW (ref 36.0–46.0)
Hemoglobin: 11.9 g/dL — ABNORMAL LOW (ref 12.0–15.0)
Immature Granulocytes: 1 %
Lymphocytes Relative: 35 %
Lymphs Abs: 2.3 10*3/uL (ref 0.7–4.0)
MCH: 28.7 pg (ref 26.0–34.0)
MCHC: 33.8 g/dL (ref 30.0–36.0)
MCV: 85 fL (ref 80.0–100.0)
Monocytes Absolute: 0.5 10*3/uL (ref 0.1–1.0)
Monocytes Relative: 8 %
Neutro Abs: 3.5 10*3/uL (ref 1.7–7.7)
Neutrophils Relative %: 54 %
Platelets: 394 10*3/uL (ref 150–400)
RBC: 4.14 MIL/uL (ref 3.87–5.11)
RDW: 13.7 % (ref 11.5–15.5)
WBC: 6.5 10*3/uL (ref 4.0–10.5)
nRBC: 0 % (ref 0.0–0.2)

## 2020-11-07 LAB — COMPREHENSIVE METABOLIC PANEL
ALT: 45 U/L — ABNORMAL HIGH (ref 0–44)
AST: 33 U/L (ref 15–41)
Albumin: 4 g/dL (ref 3.5–5.0)
Alkaline Phosphatase: 94 U/L (ref 38–126)
Anion gap: 7 (ref 5–15)
BUN: 7 mg/dL (ref 6–20)
CO2: 28 mmol/L (ref 22–32)
Calcium: 8.6 mg/dL — ABNORMAL LOW (ref 8.9–10.3)
Chloride: 104 mmol/L (ref 98–111)
Creatinine, Ser: 0.54 mg/dL (ref 0.44–1.00)
GFR, Estimated: >60 mL/min (ref >60)
Glucose, Bld: 86 mg/dL (ref 70–99)
Potassium: 3.3 mmol/L — ABNORMAL LOW (ref 3.5–5.1)
Sodium: 139 mmol/L (ref 135–145)
Total Bilirubin: 0.5 mg/dL (ref 0.3–1.2)
Total Protein: 7.8 g/dL (ref 6.5–8.1)

## 2020-11-07 LAB — LIPASE, BLOOD: Lipase: 30 U/L (ref 11–51)

## 2020-11-07 LAB — HCG, QUANTITATIVE, PREGNANCY: hCG, Beta Chain, Quant, S: 259 m[IU]/mL — ABNORMAL HIGH (ref <5)

## 2020-11-07 LAB — POC URINE PREG, ED: Preg Test, Ur: POSITIVE — AB

## 2020-11-07 MED ORDER — ONDANSETRON HCL 4 MG/2ML IJ SOLN
4.0000 mg | Freq: Once | INTRAMUSCULAR | Status: AC
  Administered 2020-11-07: 4 mg via INTRAVENOUS
  Filled 2020-11-07: qty 2

## 2020-11-07 MED ORDER — ONDANSETRON 4 MG PO TBDP: 4.0000 mg | ORAL_TABLET | Freq: Three times a day (TID) | ORAL | 0 refills | Status: DC | PRN

## 2020-11-07 MED ORDER — SODIUM CHLORIDE 0.9 % IV BOLUS
1000.0000 mL | Freq: Once | INTRAVENOUS | Status: AC
  Administered 2020-11-07: 1000 mL via INTRAVENOUS

## 2020-11-07 MED ORDER — CEPHALEXIN 500 MG PO CAPS: 500.0000 mg | ORAL_CAPSULE | Freq: Two times a day (BID) | ORAL | 0 refills | Status: AC

## 2020-11-07 MED ORDER — CEPHALEXIN 500 MG PO CAPS
500.0000 mg | ORAL_CAPSULE | Freq: Once | ORAL | Status: AC
  Administered 2020-11-07: 500 mg via ORAL
  Filled 2020-11-07: qty 1

## 2020-11-07 NOTE — ED Triage Notes (Signed)
Pt states she has been having a lot of N/V yesterday and today, took a home pregnancy test that was positive. Pt is also c/o RLQ pain. Denies any urinary sx

## 2020-11-07 NOTE — ED Provider Notes (Signed)
[facility] Emergency Department Provider Note   ____________________________________________   Event Date/Time   First MD Initiated Contact with Patient 11/07/20 1550     (approximate)  I have reviewed the triage vital signs and the nursing notes.   HISTORY  Chief Complaint Emesis During Pregnancy    HPI Latoya Stark is a 25 y.o. female, [id number], who presents to the ED complaining of abdominal pain and vomiting.  Patient reports that overnight last night she began to feel nauseous with multiple episodes of vomiting.  She states she has been unable to keep anything down since last night, has also developed pain in both sides of her lower abdomen.  Pain is constant and described as a dull ache, not exacerbated or alleviated by anything.  She denies any associated vaginal bleeding, discharge, dysuria, hematuria, or flank pain.  She has not had any fevers and denies any cough, chest pain, or shortness of breath.  She did take a pregnancy test at home earlier today, which came back positive.  Her LMP was on September 24 and she has not yet had an ultrasound or been seen by OB/GYN this pregnancy.        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[place]) 08/01/2018   Headache    Obesity    Seizures ([place])    "when I get too hot"   Trichomonas infection    UTI (lower urinary tract infection)     Patient Active Problem List   Diagnosis Date Noted   Left leg numbness 09/28/2020   Left leg weakness 09/28/2020   Chronic migraine without aura without status migrainosus, not intractable 06/22/2020   Morbid obesity ([place]) 06/22/2020   Supervision of other normal pregnancy, antepartum 10/06/2019   [redacted] weeks gestation of pregnancy 03/01/2019   Seizure disorder during pregnancy,  antepartum, third trimester ([place]) 02/28/2019   BMI 50.0-59.9, adult ([place]) 02/20/2019   Conversion disorder 02/10/2019   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   Seizure disorder in pregnancy ([place]) 11/23/2018   UTI (urinary tract infection) in pregnancy in first trimester 08/04/2018   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[place]) 08/01/2018   Chronic migraine 08/01/2018   Maternal obesity affecting pregnancy, antepartum 07/30/2018   History of seizure 07/30/2018   Supervision of high risk pregnancy, antepartum 03/19/2017    Past Surgical History:  Procedure Laterality Date   ADENOIDECTOMY     DILATION AND EVACUATION N/A 08/09/2013   Procedure: DILATATION AND EVACUATION;  Surgeon: Frederico Hamman, MD;  Location: [facility];  Service: Gynecology;  Laterality: N/A;   ORTHOPEDIC SURGERY Left    left hand   TONSILLECTOMY      Prior to Admission medications   Medication Sig Start Date End Date Taking? Authorizing Provider  cephALEXin (KEFLEX) 500 MG capsule Take 1 capsule (500 mg total) by mouth 2 (two) times daily for 7 days. 11/07/20 11/14/20 Yes Blake Divine, MD  ondansetron (ZOFRAN ODT) 4 MG disintegrating tablet Take 1 tablet (4 mg total) by mouth every 8 (eight) hours as needed for nausea or vomiting. 11/07/20  Yes Blake Divine, MD  acetaminophen (TYLENOL) 500 MG tablet Take 1,000 mg by mouth every 6 (six) hours as needed for headache.    [provider]  albuterol (PROVENTIL HFA;VENTOLIN  HFA) 108 (90 Base) MCG/ACT inhaler Inhale 2 puffs into the lungs every 6 (six) hours as needed for wheezing or shortness of breath. 08/20/17   Laban Emperor, PA-C  baclofen (LIORESAL) 10 MG tablet Take 0.5-1 tablets (5-10 mg total) by mouth 3 (three) times daily as needed for muscle spasms. 09/09/19   Hilts, Michael, MD  Blood Pressure Monitoring (BLOOD PRESSURE CUFF) MISC 1 Device by Does not apply route once a week. 12/25/18   Sloan Leiter, MD  cyclobenzaprine (FLEXERIL) 10 MG tablet Take 1 tablet (10 mg  total) by mouth every 8 (eight) hours as needed for muscle spasms. 08/18/19   Shelly Bombard, MD  ferrous sulfate 325 (65 FE) MG tablet Take 1 tablet (325 mg total) by mouth 2 (two) times daily with a meal. 04/16/19   Shelly Bombard, MD  ibuprofen (ADVIL) 600 MG tablet Take 1 tablet (600 mg total) by mouth every 6 (six) hours. 03/03/19   Luvenia Redden, PA-C  levETIRAcetam (KEPPRA) 750 MG tablet Take 2 tablets (1,500 mg total) by mouth 2 (two) times daily. 06/22/20 06/17/21  Sater, Nanine Means, MD  lidocaine (XYLOCAINE) 2 % solution Use as directed 15 mLs in the mouth or throat as needed for mouth pain. 08/30/20   Hans Eden, NP  meloxicam (MOBIC) 15 MG tablet Take 1 tablet (15 mg total) by mouth daily. 08/30/20   Hans Eden, NP  nabumetone (RELAFEN) 500 MG tablet Take 1 tablet (500 mg total) by mouth 2 (two) times daily as needed. 09/09/19   Hilts, Legrand Como, MD  polyethylene glycol (MIRALAX / GLYCOLAX) 17 g packet Take 17 g by mouth 2 (two) times daily. 11/11/18   Chauncey Mann, MD  Prenat w/o A-FeCbn-Meth-FA-DHA (PRENATE MINI) 29-0.6-0.4-350 MG CAPS Take 1 capsule by mouth daily before breakfast. 04/16/19   Shelly Bombard, MD  Prenat-FeCbn-FeAsp-Meth-FA-DHA (PRENATE MINI) 18-0.6-0.4-350 MG CAPS TAKE 1 CAPSULE BY MOUTH EVERY DAY BEFORE BREAKFAST 06/15/20   Shelly Bombard, MD  tinidazole (TINDAMAX) 500 MG tablet Take 2 tablets (1,000 mg total) by mouth daily with breakfast. 08/19/19   Shelly Bombard, MD  topiramate (TOPAMAX) 100 MG tablet Take one o qAM and two po qHS 09/28/20   Sater, Nanine Means, MD  zolpidem (AMBIEN) 5 MG tablet Take 1 tablet (5 mg total) by mouth at bedtime as needed for sleep. 04/16/19   Shelly Bombard, MD    Allergies Milk-related compounds, Mushroom extract complex, Penicillins, Cheese, Latex, Sulfa antibiotics, and Vicodin [hydrocodone-acetaminophen]  Family History  Problem Relation Age of Onset   Seizures Mother    Stroke Mother    Hypertension Father     Diabetes Maternal Grandmother    Colon cancer Maternal Grandmother    Hypertension Maternal Grandmother    Clotting disorder Maternal Grandmother    Breast cancer Maternal Grandmother    Stroke Maternal Grandmother    Diabetes Paternal Grandmother    Seizures Maternal Aunt     Social History Social History   Tobacco Use   Smoking status: Never   Smokeless tobacco: Never  Vaping Use   Vaping Use: Never used  Substance Use Topics   Alcohol use: No    Alcohol/week: 0.0 standard drinks   Drug use: No    Review of Systems  Constitutional: No fever/chills Eyes: No visual changes. ENT: No sore throat. Cardiovascular: Denies chest pain. Respiratory: Denies shortness of breath. Gastrointestinal: Positive for abdominal pain, nausea, and vomiting.  No diarrhea.  No constipation.  Genitourinary: Negative for dysuria. Musculoskeletal: Negative for back pain. Skin: Negative for rash. Neurological: Negative for headaches, focal weakness or numbness.  ____________________________________________   PHYSICAL EXAM:  VITAL SIGNS: ED Triage Vitals [11/07/20 1422]  Enc Vitals Group     BP 115/80     Pulse Rate 93     Resp 18     Temp 98.2 F (36.8 C)     Temp Source Oral     SpO2 100 %     Weight      Height      Head Circumference      Peak Flow      Pain Score 3     Pain Loc      Pain Edu?      Excl. in [place]?     Constitutional: Alert and oriented. Eyes: Conjunctivae are normal. Head: Atraumatic. Nose: No congestion/rhinnorhea. Mouth/Throat: Mucous membranes are moist. Neck: Normal ROM Cardiovascular: Normal rate, regular rhythm. Grossly normal heart sounds.  2+ radial pulses bilaterally. Respiratory: Normal respiratory effort.  No retractions. Lungs CTAB. Gastrointestinal: Soft and nontender. No distention. Genitourinary: deferred Musculoskeletal: No lower extremity tenderness nor edema. Neurologic:  Normal speech and language. No gross focal neurologic deficits are  appreciated. Skin:  Skin is warm, dry and intact. No rash noted. Psychiatric: Mood and affect are normal. Speech and behavior are normal.  ____________________________________________   LABS (all labs ordered are listed, but only abnormal results are displayed)  Labs Reviewed  PREGNANCY, URINE - Abnormal; Notable for the following components:      Result Value   Preg Test, Ur POSITIVE (*)    All other components within normal limits  URINALYSIS, ROUTINE W REFLEX MICROSCOPIC - Abnormal; Notable for the following components:   Color, Urine YELLOW (*)    APPearance HAZY (*)    Protein, ur 30 (*)    Leukocytes,Ua SMALL (*)    WBC, UA >50 (*)    Bacteria, UA MANY (*)    All other components within normal limits  COMPREHENSIVE METABOLIC PANEL - Abnormal; Notable for the following components:   Potassium 3.3 (*)    Calcium 8.6 (*)    ALT 45 (*)    All other components within normal limits  CBC WITH DIFFERENTIAL/PLATELET - Abnormal; Notable for the following components:   Hemoglobin 11.9 (*)    HCT 35.2 (*)    All other components within normal limits  HCG, QUANTITATIVE, PREGNANCY - Abnormal; Notable for the following components:   hCG, Beta Chain, Quant, S 259 (*)    All other components within normal limits  POC URINE PREG, ED - Abnormal; Notable for the following components:   Preg Test, Ur POSITIVE (*)    All other components within normal limits  URINE CULTURE  LIPASE, BLOOD    PROCEDURES  Procedure(s) performed (including Critical Care):  Procedures   ____________________________________________   INITIAL IMPRESSION / ASSESSMENT AND PLAN / ED COURSE      25 year old female, [id number], presents to the ED complaining of abdominal pain, nausea, and vomiting developing overnight with positive pregnancy test at home earlier today.  Pregnancy testing here in the ED is again positive although beta hCG levels are very low at 259.  Ultrasound was performed and shows no  intrauterine pregnancy or any adnexal masses concerning for ectopic pregnancy.  Patient has had no vaginal bleeding and there is no negation for RhoGAM at this time.  I explained to patient that findings likely represent failed pregnancy versus very  early pregnancy, lower suspicion for ectopic pregnancy at this time given minimal pain or bleeding.  UA does appear concerning for infection, we will send for culture and treat with Keflex.  We will hydrate with IV fluids and treat with Zofran, but if patient is able to tolerate p.o. she would be appropriate for discharge home with close OB/GYN follow-up.  She does take Keppra and topiramate chronically for her seizures and we will discuss any need for change in these medications with neurology.  Patient with reported allergic reaction to penicillins, however has tolerated Keflex in the past and tolerated initial dose of Keflex here in the ED.  Case discussed with Dr. Quinn Axe of neurology, who recommends continuing patient's anticonvulsant regimen as is given difficulty with control of her seizures in the past.  Risks of changing patient's anticonvulsant regimen outweigh benefits given and worsened seizure control could be dangerous for both patient and fetus.  Patient was counseled to reestablish care with OB/GYN and closely follow-up with neurology.  She was counseled to return to the ED for new worsening symptoms, patient agrees with plan.      ____________________________________________   FINAL CLINICAL IMPRESSION(S) / ED DIAGNOSES  Final diagnoses:  Nausea and vomiting in pregnancy  Pregnancy of unknown anatomic location  Seizure disorder [facility])  Acute cystitis without hematuria     ED Discharge Orders          Ordered    cephALEXin (KEFLEX) 500 MG capsule  2 times daily        11/07/20 1822    ondansetron (ZOFRAN ODT) 4 MG disintegrating tablet  Every 8 hours PRN        11/07/20 1822             Note:  This document was prepared  using Dragon voice recognition software and may include unintentional dictation errors.    Blake Divine, MD 11/07/20 [phone]

## 2020-11-07 NOTE — Progress Notes (Signed)
Neurology Telephone Note  Contacted by Dr. Charna Archer regarding this 25 yo patient w/ hx epilepsy on keppra 1500mg  bid and topiramate 100mg  q AM / 200mg  qPM f/b Dr. Arlice Colt who presented to ED tonight c/o n/v and was found to have positive pregnancy test. UA is also c/w UTI. I was called for guidance on whether her AED regimen should be changed given her positive pregnancy test.  Keppra is among the safest AEDs in pregnancy and will be fine to continue. Topiramate, like many AEDs is associated with an increased risk of IUGR and increased risk of birth defects. That said, AED regimens are not typically changed in the setting of unplanned pregnancy in patients with incomplete seizure control such as this patient 2/2 the risk that breakthrough seizures would pose to health and safety of both the patient and fetus.   Her last breakthrough seizure was a GTC last month, after which her topiramate was increased from 100mg  bid to 100/200. UA today is also c/f UTI, which could increase her seizure threshold until treated. Therefore I recommend continuing her current regimen for now and calling Dr. Garth Bigness office first thing Monday AM for further guidance. I will also send him a note through epic.  D/w Dr. Charna Archer by phone  Su Monks, MD [facility] [phone]  If [place], please page neurology on call as listed in [place].

## 2020-11-07 NOTE — ED Provider Notes (Signed)
Emergency Medicine Provider Triage Evaluation Note  Latoya Stark , a 25 y.o. female  was evaluated in triage.  Pt complains of nausea vomiting, positive home pregnancy test.  Patient presents to the ED with nausea and vomiting starting yesterday.  Patient missed her last menstrual cycle, last period was in September.  Patient has having some cramping feeling in the right lower quadrant.  No vaginal bleeding or discharge.  No dysuria, polyuria, hematuria.  No hematemesis.  No diarrhea or constipation..  Review of Systems  Positive: Positive pregnancy test, nausea and vomiting.  Right pelvic cramping Negative: Fevers, chills, URI symptoms, chest pain, hematic emesis, diarrhea or constipation  Physical Exam  There were no vitals taken for this visit. Gen:   Awake, no distress   Resp:  Normal effort  MSK:   Moves extremities without difficulty  Other:  Bowel sounds x4 quadrants.  Nontender to palpation of the abdomen.  No CVA tenderness  Medical Decision Making  Medically screening exam initiated at 2:21 PM.  Appropriate orders placed.  Latoya Stark was informed that the remainder of the evaluation will be completed by another provider, this initial triage assessment does not replace that evaluation, and the importance of remaining in the ED until their evaluation is complete.  Patient presented with nausea, vomiting x2 days and a home pregnancy test that was positive.  Missed her last menstrual cycle, previous LMP was in September.  Patient will have labs, urinalysis, pregnancy test.  She is endorsing some right pelvic cramping, however I will wait for results.  Patient has a negative pregnancy test likely will pursue different imaging versus a positive pregnancy test may or may not require imaging.   Darletta Moll, PA-C 11/07/20 1429    Blake Divine, MD 11/07/20 [phone]

## 2020-11-08 ENCOUNTER — Telehealth: Payer: Self-pay | Admitting: Neurology

## 2020-11-08 ENCOUNTER — Other Ambulatory Visit: Payer: Self-pay | Admitting: Neurology

## 2020-11-08 ENCOUNTER — Encounter: Payer: Self-pay | Admitting: Neurology

## 2020-11-08 MED ORDER — LAMOTRIGINE 25 MG PO TABS: ORAL_TABLET | ORAL | 0 refills | Status: DC

## 2020-11-08 MED ORDER — LAMOTRIGINE 100 MG PO TABS: 100.0000 mg | ORAL_TABLET | Freq: Two times a day (BID) | ORAL | 5 refills | Status: DC

## 2020-11-08 NOTE — Telephone Encounter (Signed)
Pt has recently found out she is pregnant her only concern is the topiramate (TOPAMAX) 100 MG tablet cause it could cause a birth defect to the baby. Pt requesting a call back.

## 2020-11-08 NOTE — Telephone Encounter (Signed)
Called the patient and advised that Dr. Felecia Shelling did want to wean her off of the Topamax and start her on lamotrigine. I have sent her a MyChart message advising the instructions he has laid out for coming off of the topiramate and starting the lamotrigine.  Confirmed the lamotrigine prescription will be sent to [facility].  Patient verbalized understanding and was appreciative for the call back.

## 2020-11-08 NOTE — Telephone Encounter (Signed)
Dr. [name]- this is the pt you wanted to review first. Hx seizure. Currently on topamax 100mg  q am and 200mg  q qhs and keppra 1500mg  BID

## 2020-11-09 ENCOUNTER — Other Ambulatory Visit: Payer: Self-pay | Admitting: Neurology

## 2020-11-09 ENCOUNTER — Ambulatory Visit: Payer: Medicaid Other

## 2020-11-09 ENCOUNTER — Ambulatory Visit
Admission: RE | Admit: 2020-11-09 | Discharge: 2020-11-09 | Disposition: A | Discharge status: Home / Self Care | Payer: Medicaid Other | Source: Ambulatory Visit | Attending: Neurology | Admitting: Neurology

## 2020-11-09 ENCOUNTER — Other Ambulatory Visit: Payer: Self-pay

## 2020-11-09 DIAGNOSIS — G40409 Other generalized epilepsy and epileptic syndromes, not intractable, without status epilepticus: Secondary | ICD-10-CM

## 2020-11-09 DIAGNOSIS — R29898 Other symptoms and signs involving the musculoskeletal system: Secondary | ICD-10-CM

## 2020-11-09 DIAGNOSIS — R200 Anesthesia of skin: Secondary | ICD-10-CM

## 2020-11-10 ENCOUNTER — Ambulatory Visit (INDEPENDENT_AMBULATORY_CARE_PROVIDER_SITE_OTHER): Payer: Medicaid Other

## 2020-11-10 ENCOUNTER — Telehealth: Payer: Self-pay

## 2020-11-10 DIAGNOSIS — O3680X0 Pregnancy with inconclusive fetal viability, not applicable or unspecified: Secondary | ICD-10-CM

## 2020-11-10 LAB — URINE CULTURE: Culture: >=100000 — AB

## 2020-11-10 LAB — BETA HCG QUANT (REF LAB): hCG Quant: 828 m[IU]/mL

## 2020-11-10 NOTE — Progress Notes (Signed)
History   Chief Complaint:  STAT HCG  Latoya Stark is  25 y.o. [id number] Patient's last menstrual period was 10/02/2020 (approximate).. Patient is here for follow up of quantitative HCG and ongoing surveillance of pregnancy status. She is Unknown weeks gestation by LMP 10/02/20.    Since her last visit, the patient is without new complaint.   The patient reports bleeding as none now.    Her previous Quantitative HCG values are: On 11/07/20 HCG quant 259 On 11/10/20 HCG quant 828  Study Result  Narrative & Impression  CLINICAL DATA:  Pain right lower quadrant, nausea   EXAM: OBSTETRIC <14 WK ULTRASOUND   TECHNIQUE: Transabdominal ultrasound was performed for evaluation of the gestation as well as the maternal uterus and adnexal regions.   COMPARISON:  None.   FINDINGS: There is no demonstrable intrauterine gestational sac. Endometrial stripe is prominent measuring 2 cm. There are few tiny cystic spaces in the endometrium is measuring less than 3 mm. Nabothian cysts are seen in cervix. Cervix is closed.   Subchorionic hemorrhage:  None visualized.   Maternal uterus/adnexae: Right ovary measures 3.6 x 3.3 x 2.3 cm. Left ovary measures 3.9 x 3.1 x 2.2 cm. There is 2 cm hemorrhagic cyst in the left adnexa. There is vascular flow in the adnexal regions. Small amount of free fluid is seen in cul-de-sac.   IMPRESSION: There is no demonstrable intrauterine gestational sac. There are few tiny cystic foci in the endometrium each measuring less than 3 mm. Findings suggest pregnancy of unknown location. Serial HCG estimations and short-term follow-up sonogram as warranted should be considered.   There are no adnexal masses. Small amount of free fluid in pelvis may suggest recent rupture of ovarian cyst or follicle.     Electronically Signed   By: Elmer Picker M.D.   On: 11/07/2020 16:36    Assessment: Unknown weeks gestation here for ongoing surveillance of  pregnancy.  Plan: Repeat u/s 7-10 days per MD

## 2020-11-10 NOTE — Telephone Encounter (Signed)
Pt's VM full; attempting to reach pt regarding quant results.

## 2020-11-10 NOTE — Telephone Encounter (Signed)
HCG quant is rising appropriately Pt will repeat u/s 7-10 days

## 2020-11-18 ENCOUNTER — Ambulatory Visit
Admission: RE | Admit: 2020-11-18 | Discharge: 2020-11-18 | Disposition: A | Discharge status: Home / Self Care | Payer: Medicaid Other | Source: Ambulatory Visit | Attending: Obstetrics & Gynecology | Admitting: Obstetrics & Gynecology

## 2020-11-18 ENCOUNTER — Ambulatory Visit (INDEPENDENT_AMBULATORY_CARE_PROVIDER_SITE_OTHER): Payer: Medicaid Other | Admitting: Family Medicine

## 2020-11-18 ENCOUNTER — Other Ambulatory Visit: Payer: Self-pay

## 2020-11-18 ENCOUNTER — Encounter: Payer: Self-pay | Admitting: Family Medicine

## 2020-11-18 VITALS — BP 116/48 | HR 83 | Wt 318.4 lb

## 2020-11-18 DIAGNOSIS — Z712 Person consulting for explanation of examination or test findings: Secondary | ICD-10-CM | POA: Diagnosis not present

## 2020-11-18 DIAGNOSIS — O3680X0 Pregnancy with inconclusive fetal viability, not applicable or unspecified: Secondary | ICD-10-CM | POA: Insufficient documentation

## 2020-11-18 NOTE — Progress Notes (Signed)
Here today for results from [place] for viability. Reviewed with Ernestina Patches, MD who finds gestational sac and yolk sac present. States this is expected progression of pregnancy. Recommends follow up US in 2 weeks. Follow up US scheduled 12/01/20 at 1400; patient to arrive 15 minutes early with full bladder. Ernestina Patches, MD also recommends serial beta HCG Monday and Wednesday of next week. [place] office notified to schedule and future order placed.  Medications reviewed with patient. History of seizure disorder. Neurology currently bridging from Topamax to Lamictal due to pregnancy.  Provider to bedside for brief visit with patient. Return precautions given.   Apolonio Schneiders RN 11/18/20 1724

## 2020-11-18 NOTE — Patient Instructions (Signed)

## 2020-11-22 ENCOUNTER — Other Ambulatory Visit: Payer: Self-pay

## 2020-11-22 ENCOUNTER — Other Ambulatory Visit: Payer: Medicaid Other

## 2020-11-22 DIAGNOSIS — O3680X0 Pregnancy with inconclusive fetal viability, not applicable or unspecified: Secondary | ICD-10-CM

## 2020-11-22 NOTE — Progress Notes (Unsigned)
hcg 

## 2020-11-23 ENCOUNTER — Telehealth: Payer: Self-pay

## 2020-11-23 LAB — BETA HCG QUANT (REF LAB): hCG Quant: 20853 m[IU]/mL

## 2020-11-23 NOTE — Telephone Encounter (Signed)
Pt is calling regarding her HCG results from yesterday. Please advise.

## 2020-11-24 ENCOUNTER — Other Ambulatory Visit: Payer: Medicaid Other

## 2020-11-24 ENCOUNTER — Other Ambulatory Visit: Payer: Self-pay

## 2020-11-24 DIAGNOSIS — O3680X0 Pregnancy with inconclusive fetal viability, not applicable or unspecified: Secondary | ICD-10-CM

## 2020-11-24 NOTE — Telephone Encounter (Signed)
Spoke with patient and advised her that quants are rising appropriately. Advised patient to keep appointment for f/u u/s on 12/01/20. After u/s on that day advised her to call back and schedule NOB intake. Pt agreed and verbalized understanding.

## 2020-11-25 ENCOUNTER — Encounter: Payer: Self-pay | Admitting: Family Medicine

## 2020-11-25 LAB — BETA HCG QUANT (REF LAB): hCG Quant: 26706 m[IU]/mL

## 2020-11-25 NOTE — Progress Notes (Signed)
  Amenorrhea with positive UPT PROBLEM  VISIT ENCOUNTER NOTE  Subjective:   Latoya Stark is a 25 y.o. [phone] female here for positive pregnancy test.  She has not had serial bhcg. She has. Her last [place] was on 11/10-- showed progression but no fetal pole yet.  Lab results bHCG on 11/2 was 37   US Impression: IMPRESSION: There is a gestational sac containing yolk sac within the uterus. By mean sac diameter, estimated gestational age is 6 weeks 2 days. There is no demonstrable fetal pole or fetal cardiac activity. Differential diagnostic possibilities would include very early normal IUP or failed gestation with incomplete abortion. Serial HCG estimations and follow-up sonogram as warranted should be considered.   There are no dominant adnexal masses. There is vascular flow in the adnexal regions. There is trace amount of free fluid in the pelvis, possibly physiological.  Denies abnormal vaginal bleeding, discharge, pelvic pain, problems with intercourse or other gynecologic concerns.    Gynecologic History Patient's last menstrual period was 10/02/2020 (approximate).  Health Maintenance Due  Topic Date Due   HPV VACCINES (1 - 2-dose series) Never done   COVID-19 Vaccine (3 - Booster for Pfizer series) 11/06/2019   INFLUENZA VACCINE  08/09/2020    The following portions of the patient's history were reviewed and updated as appropriate: allergies, current medications, past family history, past medical history, past social history, past surgical history and problem list.  Review of Systems Pertinent items are noted in HPI.   Objective:  BP (!) 116/48   Pulse 83   Wt (!) 318 lb 6.4 oz (144.4 kg)   LMP 10/02/2020 (Approximate)   BMI 51.39 kg/m  Gen: well appearing, NAD. obese HEENT: no scleral icterus CV: RR Lung: Normal WOB Ext: warm well perfused   Assessment and Plan:  1. Pregnancy with uncertain fetal viability, single or unspecified fetus - repeat US in 10-14  days US OB Transvaginal; Future - recommend serial b-hcg---Beta hCG quant (ref lab); Future -Given pregnancy safe medication list - Reviewed plan to start prenatal care given high risk pregnancy  2. Encounter to discuss test results Discussed her [place] results in detail    Please refer to After Visit Summary for other counseling recommendations.   Return for serial bhcg .  Caren Macadam, MD, MPH, ABFM Attending [place] for [facility]

## 2020-11-29 ENCOUNTER — Telehealth: Payer: Self-pay | Admitting: *Deleted

## 2020-11-29 NOTE — Telephone Encounter (Signed)
Pt fmla form faxed to [employer] on 11/29/20

## 2020-11-29 NOTE — Telephone Encounter (Signed)
Gave completed/signed [place] financial physician release to return to work form back to medical records to process for pt.

## 2020-12-01 ENCOUNTER — Ambulatory Visit
Admission: RE | Admit: 2020-12-01 | Discharge: 2020-12-01 | Disposition: A | Discharge status: Home / Self Care | Payer: Medicaid Other | Source: Ambulatory Visit | Attending: Family Medicine | Admitting: Family Medicine

## 2020-12-01 ENCOUNTER — Other Ambulatory Visit: Payer: Self-pay

## 2020-12-01 DIAGNOSIS — O3680X0 Pregnancy with inconclusive fetal viability, not applicable or unspecified: Secondary | ICD-10-CM | POA: Insufficient documentation

## 2020-12-03 ENCOUNTER — Telehealth: Payer: Self-pay

## 2020-12-03 NOTE — Telephone Encounter (Signed)
I called Latoya Stark today at 5:33 PM and confirmed patient's identity using two patient identifiers. [place] results from earlier today were reviewed. Patient is not yet scheduled for new OB visit at [facility]. First trimester warning signs reviewed. Patient voiced understanding and had no further questions.   CLINICAL DATA:  Evaluate for fetal viability   EXAM: TRANSVAGINAL OB ULTRASOUND   TECHNIQUE: Transvaginal ultrasound was performed for complete evaluation of the gestation as well as the maternal uterus, adnexal regions, and pelvic cul-de-sac.   COMPARISON:  11/18/2020   FINDINGS: Intrauterine gestational sac: Present   Yolk sac:  Present   Embryo:  Present   Cardiac Activity: Present   Heart Rate: 148 bpm   CRL:   14 mm   7 w  d                  [place] EDC: 07/15/2021   Subchorionic hemorrhage:  Small subchorionic hemorrhage is noted.   Maternal uterus/adnexae: Within normal limits. Right ovary is not well visualized.   IMPRESSION: Single live intrauterine gestation at 7 weeks 5 days.   Small subchorionic hemorrhage is noted.     Electronically Signed   By: Inez Catalina M.D.   On: 12/01/2020 22:00  Wende Mott, CNM 12/03/2020 5:33 PM

## 2020-12-07 ENCOUNTER — Telehealth: Payer: Self-pay

## 2020-12-07 NOTE — Telephone Encounter (Signed)
Pt called wanting to know when she would be scheduled for her NOB appointment. Advised we usually schedule those between 10-11 weeks. Advised patient to call office back and let the front staff know due date and gestational age and that she had already had ultrasound and they can schedule appointments accordingly. Pt also c/o acid reflux. Advised patient to try Tums OTC and pepcid to see if these will help and if not then can discuss further at NOB intake. Pt agreed and verbalized understanding.

## 2020-12-13 ENCOUNTER — Ambulatory Visit: Payer: Medicaid Other | Admitting: *Deleted

## 2020-12-13 DIAGNOSIS — O0990 Supervision of high risk pregnancy, unspecified, unspecified trimester: Secondary | ICD-10-CM | POA: Insufficient documentation

## 2020-12-13 MED ORDER — ONDANSETRON 4 MG PO TBDP
4.0000 mg | ORAL_TABLET | Freq: Three times a day (TID) | ORAL | 0 refills | Status: DC | PRN
Start: 2020-12-13 — End: 2021-03-22

## 2020-12-13 MED ORDER — PREPLUS 27-1 MG PO TABS: 1.0000 | ORAL_TABLET | Freq: Every day | ORAL | 13 refills | Status: DC

## 2020-12-13 MED ORDER — BLOOD PRESSURE KIT DEVI: 1.0000 | 0 refills | Status: DC

## 2020-12-13 NOTE — Progress Notes (Signed)
New OB Intake  I connected with  Latoya Stark on 12/13/20 at  2:00 PM EST by telephone Video Visit and verified that I am speaking with the correct person using two identifiers. Nurse is located at [facility] and pt is located at Home.  I discussed the limitations, risks, security and privacy concerns of performing an evaluation and management service by telephone and the availability of in person appointments. I also discussed with the patient that there may be a patient responsible charge related to this service. The patient expressed understanding and agreed to proceed.  I explained I am completing New OB Intake today. We discussed her EDD of 07/09/2021 that is based on LMP of 10/02/2020. Pt is G7/P1. I reviewed her allergies, medications, Medical/Surgical/OB history, and appropriate screenings. I informed her of [facility] services. Based on history, this is a/an  pregnancy complicated by multiple SABs, seizure disorder, morbid obesity, anemia  .   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   MyChart/Babyscripts MyChart access verified. I explained pt will have some visits in office and some virtually. Babyscripts instructions given and order placed. Patient verifies receipt of registration text/e-mail. Account successfully created and app downloaded.  Blood Pressure Cuff  Blood pressure cuff ordered for patient to pick-up from [employer]. Explained after  first prenatal appt pt will check weekly and document in 6.  Weight scale: Patient    have weight scale. Weight scale ordered   Anatomy US Explained first scheduled [place] will be around 19 weeks. Anatomy US scheduled for 19 wks at M[facility]. Pt notified to arrive at [facility].  Labs Discussed Johnsie Cancel genetic screening with patient. Would like both Panorama and Horizon drawn at new OB visit. Routine prenatal labs needed.  Covid Vaccine Patient has covid vaccine.   Social Determinants of Health Food Insecurity: Patient denies food insecurity. WIC Referral: Patient is not interested in referral to [facility].  Transportation: Patient denies transportation needs. Childcare: Discussed no children allowed at ultrasound appointments. Offered childcare services; patient declines childcare services at this time.  First visit review I reviewed new OB appt with pt. I explained she will have a pelvic exam, ob bloodwork with genetic screening, and PAP smear. Explained pt will be seen by Dr. Roselie Awkward at first visit; encounter routed to appropriate provider. Explained that patient will be seen by pregnancy navigator following visit with provider. [facility] information placed in [place], RN 12/13/2020  2:27 PM

## 2020-12-21 ENCOUNTER — Ambulatory Visit: Payer: Medicaid Other | Admitting: Family Medicine

## 2020-12-30 ENCOUNTER — Other Ambulatory Visit (HOSPITAL_COMMUNITY)
Admission: RE | Admit: 2020-12-30 | Discharge: 2020-12-30 | Disposition: A | Discharge status: Home / Self Care | Payer: Medicaid Other | Source: Ambulatory Visit | Attending: Obstetrics & Gynecology | Admitting: Obstetrics & Gynecology

## 2020-12-30 ENCOUNTER — Other Ambulatory Visit: Payer: Self-pay

## 2020-12-30 ENCOUNTER — Ambulatory Visit (INDEPENDENT_AMBULATORY_CARE_PROVIDER_SITE_OTHER): Payer: Medicaid Other | Admitting: Obstetrics & Gynecology

## 2020-12-30 VITALS — BP 113/79 | HR 108 | Wt 316.4 lb

## 2020-12-30 DIAGNOSIS — G40909 Epilepsy, unspecified, not intractable, without status epilepticus: Secondary | ICD-10-CM

## 2020-12-30 DIAGNOSIS — G40409 Other generalized epilepsy and epileptic syndromes, not intractable, without status epilepticus: Secondary | ICD-10-CM

## 2020-12-30 DIAGNOSIS — Z6841 Body Mass Index (BMI) 40.0 and over, adult: Secondary | ICD-10-CM

## 2020-12-30 DIAGNOSIS — O0990 Supervision of high risk pregnancy, unspecified, unspecified trimester: Secondary | ICD-10-CM

## 2020-12-30 DIAGNOSIS — Z3A12 12 weeks gestation of pregnancy: Secondary | ICD-10-CM | POA: Diagnosis not present

## 2020-12-30 DIAGNOSIS — Z3480 Encounter for supervision of other normal pregnancy, unspecified trimester: Secondary | ICD-10-CM

## 2020-12-30 DIAGNOSIS — O99353 Diseases of the nervous system complicating pregnancy, third trimester: Secondary | ICD-10-CM

## 2020-12-30 LAB — OB RESULTS CONSOLE GC/CHLAMYDIA: Gonorrhea: NEGATIVE

## 2020-12-30 NOTE — Progress Notes (Signed)
New OB OB Panel, OB Urine, GC/CC self swab Pap up to date Genetic screening offered and accepted  Neurologist took her out of work due to increase in seizures and working with hazardous chemicals. Seizure activity increased severely in last pregnancy.

## 2020-12-30 NOTE — Progress Notes (Signed)
Subjective:h/o sz disorder    Latoya Stark is a [id number] [redacted]w[redacted]d being seen today for her first obstetrical visit.  Her obstetrical history is significant for  seizure disorder . Patient does intend to breast feed. Pregnancy history fully reviewed.  Patient reports nausea.  Vitals:   12/30/20 1041  Weight: (!) 316 lb 6.4 oz (143.5 kg)    HISTORY: OB History  Gravida Para Term Preterm AB Living  7 1 1   5 1   SAB IAB Ectopic Multiple Live Births  4       1    # Outcome Date GA Lbr Len/2nd Weight Sex Delivery Anes PTL Lv  7 Current           6 AB 10/08/19 [redacted]w[redacted]d    SAB     5 Term 03/01/19 [redacted]w[redacted]d / 00:07 7 lb 9.9 oz (3.455 kg) M Vag-Spont EPI  LIV  4 SAB 10/10/14          3 SAB           2 SAB           1 SAB              Birth Comments: System Generated. Please review and update pregnancy details.   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[place]) 08/01/2018   Headache    Obesity    Seizures ([place])    "when I get too hot"   Trichomonas infection    UTI (lower urinary tract infection)    Past Surgical History:  Procedure Laterality Date   ADENOIDECTOMY     DILATION AND EVACUATION N/A 08/09/2013   Procedure: DILATATION AND EVACUATION;  Surgeon: Frederico Hamman, MD;  Location: [facility];  Service: Gynecology;  Laterality: N/A;   ORTHOPEDIC SURGERY Left    left hand   TONSILLECTOMY     Family History  Problem Relation Age of Onset   Cancer Mother    Seizures Mother    Stroke Mother    Sickle cell anemia Mother    Hypertension Father    Seizures Maternal Aunt    Diabetes Maternal Grandmother    Colon cancer Maternal Grandmother    Hypertension Maternal Grandmother    Clotting disorder Maternal Grandmother    Breast cancer Maternal Grandmother    Stroke Maternal Grandmother    Diabetes  Paternal Grandmother      Exam    Uterus:     Pelvic Exam:    Perineum: Deferred, patient did self swab for testing   Vulva:    Vagina:     pH:    Cervix:    Adnexa:    Bony Pelvis:   System: Breast:  normal appearance, no masses or tenderness   Skin: normal coloration and turgor, no rashes    Neurologic: normal, normal mood   Extremities: normal strength, tone, and muscle mass   HEENT PERRLA   Mouth/Teeth    Neck supple   Cardiovascular: regular rate and rhythm   Respiratory:  appears well, vitals normal, no respiratory distress, acyanotic, normal RR, ear and throat exam is normal   Abdomen: soft, non-tender; bowel sounds normal; no masses,  no organomegaly   Urinary:       Assessment:  Pregnancy: [id number] Patient Active Problem List   Diagnosis Date Noted   Supervision of high risk pregnancy, antepartum 12/13/2020   Left leg numbness 09/28/2020   Left leg weakness 09/28/2020   Chronic migraine without aura without status migrainosus, not intractable 06/22/2020   Morbid obesity ([place]) 06/22/2020   BMI 50.0-59.9, adult ([place]) 02/20/2019   Conversion disorder 02/10/2019   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) 08/01/2018   Chronic migraine 08/01/2018   History of seizure 07/30/2018        Plan:     Initial labs drawn. Prenatal vitamins. Problem list reviewed and updated. Genetic Screening discussed : ordered.  Ultrasound discussed; fetal survey: ordered.  Follow up in 4 weeks. 50% of 30 min visit spent on counseling and coordination of care.  F/u with neurology as scheduled   Emeterio Reeve 12/30/2020

## 2020-12-31 LAB — CBC/D/PLT+RPR+RH+ABO+RUBIGG...
Antibody Screen: NEGATIVE
Basophils Absolute: 0 10*3/uL (ref 0.0–0.2)
Basos: 0 %
EOS (ABSOLUTE): 0.2 10*3/uL (ref 0.0–0.4)
Eos: 3 %
HCV Ab: 0.1 s/co ratio (ref 0.0–0.9)
HIV Screen 4th Generation wRfx: NONREACTIVE
Hematocrit: 35.3 % (ref 34.0–46.6)
Hemoglobin: 12 g/dL (ref 11.1–15.9)
Hepatitis B Surface Ag: NEGATIVE
Immature Grans (Abs): 0 10*3/uL (ref 0.0–0.1)
Immature Granulocytes: 1 %
Lymphocytes Absolute: 1.5 10*3/uL (ref 0.7–3.1)
Lymphs: 25 %
MCH: 27.6 pg (ref 26.6–33.0)
MCHC: 34 g/dL (ref 31.5–35.7)
MCV: 81 fL (ref 79–97)
Monocytes Absolute: 0.5 10*3/uL (ref 0.1–0.9)
Monocytes: 8 %
Neutrophils Absolute: 3.8 10*3/uL (ref 1.4–7.0)
Neutrophils: 63 %
Platelets: 324 10*3/uL (ref 150–450)
RBC: 4.35 x10E6/uL (ref 3.77–5.28)
RDW: 13.4 % (ref 11.7–15.4)
RPR Ser Ql: NONREACTIVE
Rh Factor: POSITIVE
Rubella Antibodies, IGG: 1.35 index (ref >0.99)
WBC: 6.1 10*3/uL (ref 3.4–10.8)

## 2020-12-31 LAB — CERVICOVAGINAL ANCILLARY ONLY
Chlamydia: NEGATIVE
Comment: NEGATIVE
Comment: NORMAL
Neisseria Gonorrhea: NEGATIVE

## 2020-12-31 LAB — COMPREHENSIVE METABOLIC PANEL
ALT: 51 IU/L — ABNORMAL HIGH (ref 0–32)
AST: 33 IU/L (ref 0–40)
Albumin/Globulin Ratio: 1.5 (ref 1.2–2.2)
Albumin: 4.1 g/dL (ref 3.9–5.0)
Alkaline Phosphatase: 106 IU/L (ref 44–121)
BUN/Creatinine Ratio: 9 (ref 9–23)
BUN: 5 mg/dL — ABNORMAL LOW (ref 6–20)
Bilirubin Total: <0.2 mg/dL (ref 0.0–1.2)
CO2: 19 mmol/L — ABNORMAL LOW (ref 20–29)
Calcium: 9.3 mg/dL (ref 8.7–10.2)
Chloride: 103 mmol/L (ref 96–106)
Creatinine, Ser: 0.53 mg/dL — ABNORMAL LOW (ref 0.57–1.00)
Globulin, Total: 2.7 g/dL (ref 1.5–4.5)
Glucose: 79 mg/dL (ref 70–99)
Potassium: 4.1 mmol/L (ref 3.5–5.2)
Sodium: 137 mmol/L (ref 134–144)
Total Protein: 6.8 g/dL (ref 6.0–8.5)
eGFR: 131 mL/min/{1.73_m2} (ref >59)

## 2020-12-31 LAB — HCV INTERPRETATION

## 2020-12-31 LAB — HEMOGLOBIN A1C
Est. average glucose Bld gHb Est-mCnc: 117 mg/dL
Hgb A1c MFr Bld: 5.7 % — ABNORMAL HIGH (ref 4.8–5.6)

## 2021-01-02 LAB — CULTURE, OB URINE

## 2021-01-02 LAB — URINE CULTURE, OB REFLEX

## 2021-01-05 ENCOUNTER — Telehealth: Payer: Self-pay | Admitting: *Deleted

## 2021-01-05 NOTE — Telephone Encounter (Signed)
Pt called to office about N&V she had earlier today. Pt states she has not had much on her stomach since yesterday. Pt states when she vomited earlier today it seem to be stomach acid with some possible bloody mucous.  Pt made aware this may happen if she has nothing on her stomach, she may just vomit up stomach acids.  Pt made aware she may want to try to have something light on her stomach before she gets up to help avoid this. Pt advised if this continues or she cannot keep food/liquids down, she may need to be seen for eval.   Pt states understanding.

## 2021-01-09 NOTE — L&D Delivery Note (Signed)
OB/GYN Faculty Practice Delivery Note  Latoya Stark is a 26 y.o. [id number] s/p SVD at [redacted]w[redacted]d She was admitted for IOL for BPP 4/8.   ROM: 10h 546mith clear fluid GBS Status: negative Maximum Maternal Temperature: 99.7  Labor Progress: Presented for IOL, received cytotec x3 and then foley balloon. She was then started on pitocin and ultimately was AROMed. After AROM she had repetitive variable decelerations and her pitocin was turned off and she was given terbutaline. After fetal recovery the pitocin was ultimately restarted and she progressed to complete   Delivery Date/Time: 053500n 6/14 Delivery: Called to room and patient was complete. She pushed with 2 contractions and the head delivered ROA. Tight nuchal cord present, attempted to be reduced but unable to and and delivered through and reduced after successfully. Shoulder and body delivered in usual fashion. Infant with spontaneous cry, placed on mother's abdomen, dried and stimulated. Cord clamped x 2 after 1-minute delay, and cut by father of baby under my direct supervision. Cord blood drawn. Placenta delivered spontaneously with gentle cord traction. Fundus firm with massage and Pitocin. Labia, perineum, vagina, and cervix inspected inspected and found to be intact. Given her multiparous status a manual lower uterine segment sweep was done and a few clots were removed.    Placenta: intact, 3V cord, to L&D Complications: none Lacerations: none EBL: 150cc Analgesia: epidural  Infant: female  APGARs 9,9  weight pending  [name], MPH OB Fellow, [place] [facility]

## 2021-01-11 ENCOUNTER — Encounter: Payer: Self-pay | Admitting: Obstetrics & Gynecology

## 2021-01-27 ENCOUNTER — Encounter: Payer: Medicaid Other | Admitting: Obstetrics and Gynecology

## 2021-01-29 ENCOUNTER — Other Ambulatory Visit: Payer: Self-pay

## 2021-01-29 ENCOUNTER — Inpatient Hospital Stay (HOSPITAL_COMMUNITY)
Admission: AD | Admit: 2021-01-29 | Discharge: 2021-01-29 | Disposition: A | Discharge status: Home / Self Care | Payer: Managed Care, Other (non HMO) | Attending: Family Medicine | Admitting: Family Medicine

## 2021-01-29 DIAGNOSIS — R102 Pelvic and perineal pain: Secondary | ICD-10-CM | POA: Diagnosis not present

## 2021-01-29 DIAGNOSIS — Z3A17 17 weeks gestation of pregnancy: Secondary | ICD-10-CM | POA: Diagnosis not present

## 2021-01-29 DIAGNOSIS — O26892 Other specified pregnancy related conditions, second trimester: Secondary | ICD-10-CM | POA: Diagnosis present

## 2021-01-29 DIAGNOSIS — O26899 Other specified pregnancy related conditions, unspecified trimester: Secondary | ICD-10-CM | POA: Diagnosis not present

## 2021-01-29 LAB — URINALYSIS, ROUTINE W REFLEX MICROSCOPIC
Bilirubin Urine: NEGATIVE
Glucose, UA: NEGATIVE mg/dL
Hgb urine dipstick: NEGATIVE
Ketones, ur: NEGATIVE mg/dL
Leukocytes,Ua: NEGATIVE
Nitrite: NEGATIVE
Protein, ur: NEGATIVE mg/dL
Specific Gravity, Urine: 1.013 (ref 1.005–1.030)
pH: 6 (ref 5.0–8.0)

## 2021-01-29 MED ORDER — ACETAMINOPHEN 325 MG PO TABS
650.0000 mg | ORAL_TABLET | Freq: Once | ORAL | Status: AC
  Administered 2021-01-29: 650 mg via ORAL
  Filled 2021-01-29: qty 2

## 2021-01-29 MED ORDER — FAMOTIDINE 20 MG PO TABS
20.0000 mg | ORAL_TABLET | Freq: Once | ORAL | Status: AC
Start: 2021-01-29 — End: 2021-01-29
  Administered 2021-01-29: 20 mg via ORAL
  Filled 2021-01-29: qty 1

## 2021-01-29 MED ORDER — FAMOTIDINE 20 MG PO TABS: 20.0000 mg | ORAL_TABLET | Freq: Two times a day (BID) | ORAL | 3 refills | Status: DC

## 2021-01-29 NOTE — Discharge Instructions (Signed)

## 2021-01-29 NOTE — MAU Note (Signed)
...  Latoya Stark is a 26 y.o. at [redacted]w[redacted]d here in MAU reporting: Bilateral lower abdominal pain that has been on going for two weeks. She states the pain is sharp and aching. She states the pain increases with position changes, especially when she lies back. She states she had an episode of dark brown vaginal bleeding yesterday but none today. She is also complaining of acid reflux that is the worst at night when she lies down. She states she does not have any medications for this. No LOF. Last IC one month ago.  She is also requesting her right ear to be evaluated and states it has been clogged for two weeks.  Pain score:  8/10 bilateral lower abdomen 9/84 above umbilicus   FHT: 730 doppler Lab orders placed from triage:  UA

## 2021-01-29 NOTE — MAU Provider Note (Signed)
History     CSN: [id number]  Arrival date and time: 01/29/21 1516   Event Date/Time   First Provider Initiated Contact with Patient 01/29/21 1610      Chief Complaint  Patient presents with   Abdominal Pain   HPI Latoya Stark is a 26 y.o. [id number] at 83w0dwho presents with left sided abdominal pain. She states it is an intermittent pain that is sharp and shooting in nature. She reports it comes with movement and when she goes from sitting to standing. She has no pain at rest. She has not tried anything for the pain. She denies any bleeding or leaking.   OB History     Gravida  7   Para  1   Term  1   Preterm      AB  5   Living  1      SAB  4   IAB      Ectopic      Multiple      Live Births  1           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[place] 08/01/2018   Headache    Obesity    Seizures ([place]    "when I get too hot"   Trichomonas infection    UTI (lower urinary tract infection)     Past Surgical History:  Procedure Laterality Date   ADENOIDECTOMY     DILATION AND EVACUATION N/A 08/09/2013   Procedure: DILATATION AND EVACUATION;  Surgeon: BFrederico Hamman MD;  [facility];  Service: Gynecology;  Laterality: N/A;   ORTHOPEDIC SURGERY Left    left hand   TONSILLECTOMY      Family History  Problem Relation Age of Onset   Cancer Mother    Seizures Mother    Stroke Mother    Sickle cell anemia Mother    Hypertension Father    Seizures Maternal Aunt    Diabetes Maternal Grandmother    Colon cancer Maternal Grandmother    Hypertension Maternal Grandmother    Clotting disorder Maternal Grandmother    Breast cancer Maternal Grandmother    Stroke Maternal Grandmother    Diabetes Paternal Grandmother     Social History   Tobacco Use    Smoking status: Never   Smokeless tobacco: Never  Vaping Use   Vaping Use: Never used  Substance Use Topics   Alcohol use: Not Currently    Comment: occ   Drug use: No    Allergies:  Allergies  Allergen Reactions   Milk-Related Compounds Dermatitis    Breaks face out *can have 2% milk*   Mushroom Extract Complex Anaphylaxis   Penicillins Anaphylaxis    Has patient had a PCN reaction causing immediate rash, facial/tongue/throat swelling, SOB or lightheadedness with hypotension: Yes Has patient had a PCN reaction causing severe rash involving mucus membranes or skin necrosis: No Has patient had a PCN reaction that required hospitalization No Has patient had a PCN reaction occurring within the last 10 years: Yes If all of the above answers are "NO", then may proceed with Cephalosporin use.    Cheese Other (See Comments)    Breaks face out   Latex Itching and Swelling   Sulfa Antibiotics Swelling  Vicodin [Hydrocodone-Acetaminophen] Hives    Medications Prior to Admission  Medication Sig Dispense Refill Last Dose   lamoTRIgine (LAMICTAL) 100 MG tablet Take 1 tablet (100 mg total) by mouth 2 (two) times daily. 60 tablet 5 Past Week   levETIRAcetam (KEPPRA) 750 MG tablet Take 2 tablets (1,500 mg total) by mouth 2 (two) times daily. 120 tablet 11 01/29/2021   Prenatal Vit-Fe Fumarate-FA (PREPLUS) 27-1 MG TABS Take 1 tablet by mouth daily. 30 tablet 13 Past Week   acetaminophen (TYLENOL) 500 MG tablet Take 1,000 mg by mouth every 6 (six) hours as needed for headache.      albuterol (PROVENTIL HFA;VENTOLIN HFA) 108 (90 Base) MCG/ACT inhaler Inhale 2 puffs into the lungs every 6 (six) hours as needed for wheezing or shortness of breath. (Patient not taking: Reported on 12/13/2020) 1 Inhaler 0    baclofen (LIORESAL) 10 MG tablet Take 0.5-1 tablets (5-10 mg total) by mouth 3 (three) times daily as needed for muscle spasms. (Patient not taking: Reported on 12/30/2020) 30 each 3    Blood  Pressure Monitoring (BLOOD PRESSURE CUFF) MISC 1 Device by Does not apply route once a week. (Patient not taking: Reported on 11/18/2020) 1 each 0    Blood Pressure Monitoring (BLOOD PRESSURE KIT) DEVI 1 Device by Does not apply route once a week. 1 each 0    cyclobenzaprine (FLEXERIL) 10 MG tablet Take 1 tablet (10 mg total) by mouth every 8 (eight) hours as needed for muscle spasms. (Patient not taking: Reported on 12/30/2020) 30 tablet 1    ferrous sulfate 325 (65 FE) MG tablet Take 1 tablet (325 mg total) by mouth 2 (two) times daily with a meal. (Patient not taking: Reported on 11/18/2020) 60 tablet 5    lamoTRIgine (LAMICTAL) 25 MG tablet Take 1 tablet (25 mg total) by mouth daily for 7 days, THEN 1 tablet (25 mg total) 2 (two) times daily for 7 days, THEN 2 tablets (50 mg total) 2 (two) times daily for 7 days. 49 tablet 0    meloxicam (MOBIC) 15 MG tablet Take 1 tablet (15 mg total) by mouth daily. (Patient not taking: Reported on 12/30/2020) 30 tablet 0    ondansetron (ZOFRAN ODT) 4 MG disintegrating tablet Take 1 tablet (4 mg total) by mouth every 8 (eight) hours as needed for nausea or vomiting. 12 tablet 0    polyethylene glycol (MIRALAX / GLYCOLAX) 17 g packet Take 17 g by mouth 2 (two) times daily. (Patient not taking: Reported on 12/30/2020) 24 each 3    topiramate (TOPAMAX) 100 MG tablet Take one o qAM and two po qHS (Patient not taking: Reported on 12/13/2020) 90 tablet 11    zolpidem (AMBIEN) 5 MG tablet Take 1 tablet (5 mg total) by mouth at bedtime as needed for sleep. (Patient not taking: Reported on 12/30/2020) 15 tablet 1     Review of Systems  Constitutional: Negative.  Negative for fatigue and fever.  HENT: Negative.    Respiratory: Negative.  Negative for shortness of breath.   Cardiovascular: Negative.  Negative for chest pain.  Gastrointestinal:  Positive for abdominal pain. Negative for constipation, diarrhea, nausea and vomiting.  Genitourinary: Negative.  Negative for  dysuria and vaginal bleeding.  Neurological: Negative.  Negative for dizziness and headaches.  Physical Exam   Blood pressure 117/80, pulse 89, temperature 98.2 F (36.8 C), temperature source Oral, resp. rate 17, height _0  (1.651 m), weight (!) 142.5 kg, last menstrual period 10/02/2020, SpO2 100 %,  unknown if currently breastfeeding.  Physical Exam Vitals and nursing note reviewed.  Constitutional:      General: She is not in acute distress.    Appearance: She is well-developed.  HENT:     Head: Normocephalic.  Eyes:     Pupils: Pupils are equal, round, and reactive to light.  Cardiovascular:     Rate and Rhythm: Normal rate and regular rhythm.     Heart sounds: Normal heart sounds.  Pulmonary:     Effort: Pulmonary effort is normal. No respiratory distress.     Breath sounds: Normal breath sounds.  Abdominal:     General: Bowel sounds are normal. There is no distension.     Palpations: Abdomen is soft.     Tenderness: There is no abdominal tenderness.  Skin:    General: Skin is warm and dry.  Neurological:     Mental Status: She is alert and oriented to person, place, and time.  Psychiatric:        Mood and Affect: Mood normal.        Behavior: Behavior normal.        Thought Content: Thought content normal.        Judgment: Judgment normal.   Cervix closed/thick/posterior  FHT: 134 bpm  MAU Course  Procedures Results for orders placed or performed during the hospital encounter of 01/29/21 (from the past 24 hour(s))  Urinalysis, Routine w reflex microscopic Urine, Clean Catch     Status: None   Collection Time: 01/29/21  3:49 PM  Result Value Ref Range   Color, Urine YELLOW YELLOW   APPearance CLEAR CLEAR   Specific Gravity, Urine 1.013 1.005 - 1.030   pH 6.0 5.0 - 8.0   Glucose, UA NEGATIVE NEGATIVE mg/dL   Hgb urine dipstick NEGATIVE NEGATIVE   Bilirubin Urine NEGATIVE NEGATIVE   Ketones, ur NEGATIVE NEGATIVE mg/dL   Protein, ur NEGATIVE NEGATIVE mg/dL    Nitrite NEGATIVE NEGATIVE   Leukocytes,Ua NEGATIVE NEGATIVE    MDM UA Tylenol PO Pepcid  Encouraged patient to use pregnancy support belt and change positions slowly.   Assessment and Plan   1. Pain of round ligament affecting pregnancy, antepartum   2. [redacted] weeks gestation of pregnancy    -Discharge home in stable condition -Rx for pepcid sent to patient's pharmacy -Abdominal pain precautions discussed -Patient advised to follow-up with OB as scheduled for prenatal care -Patient may return to MAU as needed or if her condition were to change or worsen   Wende Mott CNM 01/29/2021, 5:19 PM

## 2021-02-08 ENCOUNTER — Ambulatory Visit (INDEPENDENT_AMBULATORY_CARE_PROVIDER_SITE_OTHER): Payer: Medicaid Other | Admitting: Obstetrics & Gynecology

## 2021-02-08 ENCOUNTER — Other Ambulatory Visit: Payer: Self-pay

## 2021-02-08 ENCOUNTER — Institutional Professional Consult (permissible substitution): Payer: Medicaid Other | Admitting: Licensed Clinical Social Worker

## 2021-02-08 VITALS — BP 109/71 | HR 98 | Wt 317.3 lb

## 2021-02-08 DIAGNOSIS — Z6841 Body Mass Index (BMI) 40.0 and over, adult: Secondary | ICD-10-CM

## 2021-02-08 DIAGNOSIS — O0990 Supervision of high risk pregnancy, unspecified, unspecified trimester: Secondary | ICD-10-CM

## 2021-02-08 DIAGNOSIS — G40409 Other generalized epilepsy and epileptic syndromes, not intractable, without status epilepticus: Secondary | ICD-10-CM

## 2021-02-08 MED ORDER — FLUCONAZOLE 150 MG PO TABS: 150.0000 mg | ORAL_TABLET | Freq: Once | ORAL | 0 refills | Status: AC

## 2021-02-08 NOTE — Progress Notes (Signed)
Pt states having bad heart burn, low abdominal pain.Pt states can't sleep.Pt states has thick white d/c with no odor.

## 2021-02-08 NOTE — Progress Notes (Signed)
PRENATAL VISIT NOTE  Subjective:  Latoya Stark is a 26 y.o. [phone] at 68w3dbeing seen today for ongoing prenatal care.  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  Patient reports no complaints.  Contractions: Not present. Vag. Bleeding: None.  Movement: Absent. Denies leaking of fluid.   The following portions of the patient's history were reviewed and updated as appropriate: allergies, current medications, past family history, past medical history, past social history, past surgical history and problem list.   Objective:   Vitals:   02/08/21 1441  BP: 109/71  Pulse: 98  Weight: (!) 317 lb 4.8 oz (143.9 kg)    Fetal Status:     Movement: Absent     General:  Alert, oriented and cooperative. Patient is in no acute distress.  Skin: Skin is warm and dry. No rash noted.   Cardiovascular: Normal heart rate noted  Respiratory: Normal respiratory effort, no problems with respiration noted  Abdomen: Soft, gravid, appropriate for gestational age.  Pain/Pressure: Present     Pelvic: Cervical exam deferred        Extremities: Normal range of motion.  Edema: Trace  Mental Status: Normal mood and affect. Normal behavior. Normal judgment and thought content.   Assessment and Plan:  Pregnancy: [id number] 129w3d. Supervision of high risk pregnancy, antepartum screening - AFP, Serum, Open Spina Bifida - fluconazole (DIFLUCAN) 150 MG tablet; Take 1 tablet (150 mg total) by mouth once for 1 dose.  Dispense: 1 tablet; Refill: 0  2. BMI 50.0-59.9, adult  (HCC) Body mass index is 52.8 kg/m.   3. 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     Blood Pressure Monitoring (BLOOD PRESSURE KIT) DEVI 1 Device by Does not apply route once a week. 1 each 0   lamoTRIgine (LAMICTAL) 100 MG tablet Take 1 tablet (100 mg total) by mouth 2 (two) times daily. 60 tablet 5   levETIRAcetam (KEPPRA) 750 MG tablet Take 2 tablets (1,500 mg total) by mouth 2 (two) times daily. 120 tablet 11   ondansetron (ZOFRAN ODT) 4 MG disintegrating tablet Take 1 tablet (4 mg total) by mouth every 8 (eight) hours as needed for nausea or vomiting. 12 tablet 0   Prenatal Vit-Fe Fumarate-FA (PREPLUS) 27-1 MG TABS Take 1 tablet by mouth daily. 30 tablet 13   albuterol (PROVENTIL HFA;VENTOLIN HFA) 108 (90 Base) MCG/ACT inhaler Inhale 2 puffs into the lungs every 6 (six) hours as needed for wheezing or shortness of breath. (Patient not taking: Reported on 12/13/2020) 1 Inhaler 0   Blood Pressure Monitoring (BLOOD PRESSURE CUFF) MISC 1 Device by Does not apply route once a week. (Patient not taking: Reported on 11/18/2020) 1 each 0   cyclobenzaprine (FLEXERIL) 10 MG tablet Take 1 tablet (10 mg total) by mouth every 8 (eight) hours as needed for muscle spasms. (Patient not taking: Reported on 12/30/2020) 30 tablet 1   famotidine (PEPCID) 20 MG tablet Take 1 tablet (20 mg  total) by mouth 2 (two) times daily. (Patient not taking: Reported on 02/08/2021) 30 tablet 3   ferrous sulfate 325 (65 FE) MG tablet Take 1 tablet (325 mg total) by mouth 2 (two) times daily with a meal. (Patient not taking: Reported on 11/18/2020) 60 tablet 5   lamoTRIgine (LAMICTAL) 25 MG tablet Take 1 tablet (25 mg total) by mouth daily for 7 days, THEN 1 tablet (25 mg total) 2 (two) times daily for 7 days, THEN 2 tablets (50 mg  total) 2 (two) times daily for 7 days. 49 tablet 0   polyethylene glycol (MIRALAX / GLYCOLAX) 17 g packet Take 17 g by mouth 2 (two) times daily. (Patient not taking: Reported on 12/30/2020) 24 each 3   topiramate (TOPAMAX) 100 MG tablet Take one o qAM and two po qHS (Patient not taking: Reported on 12/13/2020) 90 tablet 11   No current facility-administered medications on file prior to visit.     Preterm labor symptoms and general obstetric precautions including but not limited to vaginal bleeding, contractions, leaking of fluid and fetal movement were reviewed in detail with the patient. Please refer to After Visit Summary for other counseling recommendations.   Return in about 4 weeks (around 03/08/2021).  Future Appointments  Date Time Provider [place]  02/14/2021 11:15 AM [facility] NURSE [facility]  02/14/2021 11:30 AM [facility] US3 [facility]  03/29/2021 11:30 AM Sater, Nanine Means, MD [facility] None    Emeterio Reeve, MD

## 2021-02-10 LAB — AFP, SERUM, OPEN SPINA BIFIDA
AFP MoM: 0.74
AFP Value: 24.2 ng/mL
Gest. Age on Collection Date: 18 weeks
Maternal Age At EDD: 27 yr
OSBR Risk 1 IN: 10000
Test Results:: NEGATIVE
Weight: 317 [lb_av]

## 2021-02-14 ENCOUNTER — Ambulatory Visit (HOSPITAL_BASED_OUTPATIENT_CLINIC_OR_DEPARTMENT_OTHER): Payer: Managed Care, Other (non HMO) | Admitting: Obstetrics and Gynecology

## 2021-02-14 ENCOUNTER — Ambulatory Visit: Payer: Managed Care, Other (non HMO) | Attending: Obstetrics & Gynecology

## 2021-02-14 ENCOUNTER — Ambulatory Visit (INDEPENDENT_AMBULATORY_CARE_PROVIDER_SITE_OTHER): Payer: Managed Care, Other (non HMO) | Admitting: Licensed Clinical Social Worker

## 2021-02-14 ENCOUNTER — Encounter: Payer: Self-pay | Admitting: *Deleted

## 2021-02-14 ENCOUNTER — Other Ambulatory Visit: Payer: Self-pay | Admitting: Obstetrics & Gynecology

## 2021-02-14 ENCOUNTER — Other Ambulatory Visit: Payer: Self-pay

## 2021-02-14 ENCOUNTER — Ambulatory Visit: Payer: Managed Care, Other (non HMO) | Admitting: *Deleted

## 2021-02-14 ENCOUNTER — Other Ambulatory Visit: Payer: Self-pay | Admitting: *Deleted

## 2021-02-14 VITALS — BP 110/58 | HR 82

## 2021-02-14 DIAGNOSIS — O99891 Other specified diseases and conditions complicating pregnancy: Secondary | ICD-10-CM | POA: Diagnosis not present

## 2021-02-14 DIAGNOSIS — O0990 Supervision of high risk pregnancy, unspecified, unspecified trimester: Secondary | ICD-10-CM | POA: Insufficient documentation

## 2021-02-14 DIAGNOSIS — O99352 Diseases of the nervous system complicating pregnancy, second trimester: Secondary | ICD-10-CM

## 2021-02-14 DIAGNOSIS — Z3A19 19 weeks gestation of pregnancy: Secondary | ICD-10-CM | POA: Diagnosis present

## 2021-02-14 DIAGNOSIS — Z362 Encounter for other antenatal screening follow-up: Secondary | ICD-10-CM

## 2021-02-14 DIAGNOSIS — Z8659 Personal history of other mental and behavioral disorders: Secondary | ICD-10-CM | POA: Diagnosis not present

## 2021-02-14 DIAGNOSIS — O99212 Obesity complicating pregnancy, second trimester: Secondary | ICD-10-CM | POA: Insufficient documentation

## 2021-02-14 DIAGNOSIS — G40909 Epilepsy, unspecified, not intractable, without status epilepticus: Secondary | ICD-10-CM

## 2021-02-14 NOTE — BH Specialist Note (Signed)
Integrated Behavioral Health via Telemedicine Visit  02/14/2021 Latoya Stark [id number]  Number of [place] visits: 1 Session Start time: 130pm  Session End time: 157pm Total time: 27 mins via mychart video   Referring Provider: Dr. Roselie Awkward  Patient/Family location: Home  [facility] Provider location: [place] All persons participating in visit: Latoya Stark and LCSW A. Jazelle Achey Types of Service: Individual psychotherapy and Video visit  I connected with [place] and/or [place] via  Telephone or Video Enabled Telemedicine Application  (Video is Caregility application) and verified that I am speaking with the correct person using two identifiers. Discussed confidentiality: Yes   I discussed the limitations of telemedicine and the availability of in person appointments.  Discussed there is a possibility of technology failure and discussed alternative modes of communication if that failure occurs.  I discussed that engaging in this telemedicine visit, they consent to the provision of behavioral healthcare and the services will be billed under their insurance.  Patient and/or legal guardian expressed understanding and consented to Telemedicine visit: Yes   Presenting Concerns: Patient and/or family reports the following symptoms/concerns: history of anxiety and postpartum depression Duration of problem: over one year ; Severity of problem: mild  Patient and/or Family's Strengths/Protective Factors: Concrete supports in place (healthy food, safe environments, etc.)  Goals Addressed: Patient will:  Reduce symptoms of: anxiety and depression   Increase knowledge and/or ability of: coping skills   Demonstrate ability to: Increase adequate support systems for patient/family  Progress towards Goals: Ongoing  Interventions: Interventions utilized:  Supportive Counseling Standardized Assessments completed: PHQ 9  Patient and/or Family Response: Ms.  Stark responded well to scheduled mychart visit   Assessment: Patient reports history of postpartum depression and anxiety. Latoya Stark reports she coping well with current pregnancy.    Patient may benefit from integrated behavioral health.  Plan: Follow up with behavioral health clinician on : 03/01/2021 Behavioral recommendations: Prioritize rest, communicate need with fiance for added support, create and sustain boundaries, take prenatal vitamins, attend schedule medical appts, engage in stress reducing activities and mindfulness to prevent burnout Referral(s): [place] (In Clinic)  I discussed the assessment and treatment plan with the patient and/or parent/guardian. They were provided an opportunity to ask questions and all were answered. They agreed with the plan and demonstrated an understanding of the instructions.   They were advised to call back or seek an in-person evaluation if the symptoms worsen or if the condition fails to improve as anticipated.  Lynnea Ferrier, LCSW

## 2021-02-14 NOTE — Progress Notes (Signed)
Maternal-Fetal Medicine   Name: Latoya Stark DOB: [birth year] MRN: [id number] Referring Provider: Emeterio Reeve, MD  I had the pleasure of seeing [name] today at the [facility]. She is G8 P1061 at [place] 2d gestation and is here for fetal anatomy scan.  Past medical history is significant for seizure disorder diagnosed at 7 years ago.  She is being followed by her neurologist Dr. Felecia Shelling.  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  Patient takes Keppra 1,500 mg twice daily and lamotrigine 100 mg twice daily.  She reports her last episode was about a month ago.   She does not give history of hypertension or diabetes or any other chronic medical conditions.  She has mild intermittent asthma that is well controlled with albuterol as needed.  Obstetric history significant for a term vaginal delivery in February 2021 of female infant weighing 7 pounds 10 ounces at birth.  She underwent induction of labor at [redacted] weeks gestation and had poor control of her seizures in that pregnancy. Past surgical history: Left hand surgery, tonsillectomy. Allergies: Penicillin (throat closes), sulfa drugs (hives), Vicodin (hives). Social history: Denies tobacco or drug or alcohol use. Prenatal course: On cell-free fetal DNA screening, the risks of fetal aneuploidies are not increased.  MSAFP screening showed low risk for open neural tube defects.  Ultrasound We performed a fetal anatomical survey.  Amniotic fluid is normal and good fetal activity seen.  Unilateral (left) choroid plexus cyst is seen.  No other markers of aneuploidies or fetal structural defects are seen.  Fetal biometry is consistent with the previously established dates. As maternal obesity poses limitations on the resolution of the images, fetal anomalies may be missed.  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 In a small number of cases, the frequency is increased (20%). In a compliant patient with  no history of seizures for several years, I expect her to be seizure-free during pregnancy. -Some studies have shown that epilepsy is associated with fetal growth restriction and preterm delivery. However, most pregnancies do not have any adverse pregnancy outcome. -Congenital malformations are lower with monotherapy. A small increase in congenital anomalies is seen in patients using anticonvulsants. Not much is known about newer anticonvulsants (including lamictal), but the malformations are expected to be lower than older anticonvulsants. -I emphasized the importance of compliance with anticonvulsants in pregnancy.  -Monitoring of drug levels may be helpful to adjust dosage of anticonvulsants. --Vaginal delivery is not contraindicated, and cesarean delivery will be performed only for obstetric indications. Induction of labor may be considered at 39 weeks' gestation.  Medications Lamictal (lamotrigene): From the registry that reported 1,558 pregnancies exposed to [place], no increased congenital malformations (over the background rate) have been reported. No increase in facial clefts has been reported. Lamotrigene is secreted in breast milk. The [place] [employer] opines that the effects on the newborn is unknown, but may be of concern.  Keppra (Etiracetam): No increased congenital malformations have been reported with keppra. The long-term effects on the newborns with breastfeeding while on keppra, are unknown, but may be safe.  Choroid plexus cyst I counseled the patient that isolated CPC is only rarely associated with chromosomal anomaly (trisomy 18).  Given that she had low risk for trisomy 18 on cell-free fetal DNA screening, this should be considered a normal variant and not a marker for trisomy 18.  I also reassured her that [facility] is not associated with structural malformations in the brain.  CPCs usually resolve with advancing gestation.  Recommendations -Appointment was made  for her to return in 4 weeks for completion of fetal anatomy. -Fetal growth assessments every 4 weeks. -Weekly BPP from [redacted] weeks gestation until delivery. -Continue Keppra and lamotrigine. -Continue follow-up with neurologist for adjustment of dosage. -Drug levels to be monitored every trimester or if seizure episodes occur. -Induction of labor at [redacted] weeks gestation.  Thank you for consultation.  If you have any questions or concerns, please contact me the [facility].  Consultation including face-to-face (more than 50%) counseling 45 minutes.

## 2021-02-22 ENCOUNTER — Observation Stay
Admission: EM | Admit: 2021-02-22 | Discharge: 2021-02-22 | Disposition: A | Discharge status: Home / Self Care | Payer: Managed Care, Other (non HMO) | Attending: Certified Nurse Midwife | Admitting: Certified Nurse Midwife

## 2021-02-22 ENCOUNTER — Telehealth: Payer: Self-pay | Admitting: Emergency Medicine

## 2021-02-22 ENCOUNTER — Telehealth: Payer: Self-pay

## 2021-02-22 DIAGNOSIS — Z91040 Latex allergy status: Secondary | ICD-10-CM | POA: Insufficient documentation

## 2021-02-22 DIAGNOSIS — O99891 Other specified diseases and conditions complicating pregnancy: Secondary | ICD-10-CM | POA: Diagnosis present

## 2021-02-22 DIAGNOSIS — O23592 Infection of other part of genital tract in pregnancy, second trimester: Principal | ICD-10-CM | POA: Insufficient documentation

## 2021-02-22 DIAGNOSIS — Z3A20 20 weeks gestation of pregnancy: Secondary | ICD-10-CM | POA: Insufficient documentation

## 2021-02-22 DIAGNOSIS — O0990 Supervision of high risk pregnancy, unspecified, unspecified trimester: Secondary | ICD-10-CM

## 2021-02-22 DIAGNOSIS — R102 Pelvic and perineal pain unspecified side: Secondary | ICD-10-CM | POA: Diagnosis present

## 2021-02-22 DIAGNOSIS — O26892 Other specified pregnancy related conditions, second trimester: Secondary | ICD-10-CM | POA: Diagnosis present

## 2021-02-22 LAB — CHLAMYDIA/NGC RT PCR (ARMC ONLY)
Chlamydia Tr: NOT DETECTED
N gonorrhoeae: NOT DETECTED

## 2021-02-22 LAB — URINALYSIS, COMPLETE (UACMP) WITH MICROSCOPIC
Bilirubin Urine: NEGATIVE
Glucose, UA: NEGATIVE mg/dL
Hgb urine dipstick: NEGATIVE
Ketones, ur: 5 mg/dL — AB
Leukocytes,Ua: NEGATIVE
Nitrite: NEGATIVE
Protein, ur: NEGATIVE mg/dL
Specific Gravity, Urine: 1.032 — ABNORMAL HIGH (ref 1.005–1.030)
pH: 5 (ref 5.0–8.0)

## 2021-02-22 LAB — WET PREP, GENITAL
Sperm: NONE SEEN
Trich, Wet Prep: NONE SEEN
WBC, Wet Prep HPF POC: >=10 — AB (ref <10)
Yeast Wet Prep HPF POC: NONE SEEN

## 2021-02-22 LAB — OB RESULTS CONSOLE GC/CHLAMYDIA
Chlamydia: NEGATIVE
Gonorrhea: NEGATIVE

## 2021-02-22 MED ORDER — METRONIDAZOLE 500 MG PO TABS
500.0000 mg | ORAL_TABLET | Freq: Once | ORAL | Status: AC
  Administered 2021-02-22: 500 mg via ORAL
  Filled 2021-02-22: qty 1

## 2021-02-22 MED ORDER — METRONIDAZOLE 500 MG PO TABS: 500.0000 mg | ORAL_TABLET | Freq: Two times a day (BID) | ORAL | 0 refills | Status: AC

## 2021-02-22 NOTE — Telephone Encounter (Signed)
Patient called and left vm on triage line stating that she is experiencing really bad braxton hicks and lower abdominal pain.  Attempted to call patient back. No answer. Unable to leave vm due to vm box being full.

## 2021-02-22 NOTE — OB Triage Note (Addendum)
Presents with complaint of lower abdominal pain for 2.5 days. Pain has been constant for last 2 days and states that she has not felt baby move in days but reports was feeling movement prior. Denies any bleeding or LOF. Denies any urinary symptoms but states she has not had a BM in 3 days.

## 2021-02-22 NOTE — Telephone Encounter (Signed)
Patient called reporting severe abdominal pain to the point of tears since yesterday. Patient states that she has tried laying down in different positions, laying down with pillow.   Patient denies having any bleeding, leakage of fluids, but reports NO FETAL MOVEMENT SINCE 2 DAYS AGO.   Patient states that she has been at the ED with significant other and was trying interventions at home.   Patient advised to go to [facility] ASAP for evaluation.   Patient verbalized understanding.

## 2021-02-22 NOTE — Discharge Summary (Signed)
Patient ID: Latoya Stark MRN: [id number] DOB/AGE: 1995-04-16 26 y.o.  Admit date: 02/22/2021 Discharge date: 02/22/2021  Admission Diagnoses: 25yo G8P1 at 73w3dpresents with pelvic pain x 2.5 days. Pt reports she has not had a BM for 3 days, she has tried Mirilax and prune juice with no resolution.  Discharge Diagnoses: BV and possible round ligament pain  Factors complicating pregnancy: Seizure Disorder - last seizure 3 weeks ago, on Keppra 15087mBID and Lamotrigine 10026mID BMI 56.26  Prenatal Procedures: none  Consults: None  Significant Diagnostic Studies:  Results for orders placed or performed during the hospital encounter of 02/22/21 (from the past 168 hour(s))  Urinalysis, Complete w Microscopic Urine, Clean Catch   Collection Time: 02/22/21  6:00 PM  Result Value Ref Range   Color, Urine YELLOW (A) YELLOW   APPearance HAZY (A) CLEAR   Specific Gravity, Urine 1.032 (H) 1.005 - 1.030   pH 5.0 5.0 - 8.0   Glucose, UA NEGATIVE NEGATIVE mg/dL   Hgb urine dipstick NEGATIVE NEGATIVE   Bilirubin Urine NEGATIVE NEGATIVE   Ketones, ur 5 (A) NEGATIVE mg/dL   Protein, ur NEGATIVE NEGATIVE mg/dL   Nitrite NEGATIVE NEGATIVE   Leukocytes,Ua NEGATIVE NEGATIVE   RBC / HPF 0-5 0 - 5 RBC/hpf   WBC, UA 0-5 0 - 5 WBC/hpf   Bacteria, UA RARE (A) NONE SEEN   Squamous Epithelial / LPF 6-10 0 - 5   Mucus PRESENT   Wet prep, genital   Collection Time: 02/22/21  6:01 PM  Result Value Ref Range   Yeast Wet Prep HPF POC NONE SEEN NONE SEEN   Trich, Wet Prep NONE SEEN NONE SEEN   Clue Cells Wet Prep HPF POC PRESENT (A) NONE SEEN   WBC, Wet Prep HPF POC >=10 (A) <10   Sperm NONE SEEN     Treatments: Oral hydration   Hospital Course:  This is a [age]o. [id number] IUP at 20w61w3dn for pelvic pain at 20w341w3d leaking of fluid and no bleeding.  Dr. JacksGlennon Macted and consulted. Labs collected, SVE - Closed and long.  Doppler 140-150. Wet prep is positive for BV, first treatment  was given prior to discharge. Prescription for remaining doses sent to her pharmacy. She was observed, fetal heart rate monitoring remained reassuring, and she had no signs/symptoms of labor or other maternal-fetal concerns.  She was deemed stable for discharge to home with outpatient follow up.  Discharge Physical Exam:  BP (!) 119/59 (BP Location: Left Arm)    Pulse 86    Temp 97.9 F (36.6 C) (Oral)    Resp 18    Ht '5\' 3"'  (1.6 m)    Wt (!) 144.1 kg    LMP 10/02/2020 (Approximate)    BMI 56.26 kg/m   General: NAD CV: RRR Pulm: nl effort ABD: s/nd/nt, gravid   Discharge Condition: Stable  Disposition:  There are no questions and answers to display.         Allergies as of 02/22/2021       Reactions   Milk-related Compounds Dermatitis   Breaks face out *can have 2% milk*   Mushroom Extract Complex Anaphylaxis   Penicillins Anaphylaxis   Has patient had a PCN reaction causing immediate rash, facial/tongue/throat swelling, SOB or lightheadedness with hypotension: Yes Has patient had a PCN reaction causing severe rash involving mucus membranes or skin necrosis: No Has patient had a PCN reaction that required hospitalization No Has patient had a  PCN reaction occurring within the last 10 years: Yes If all of the above answers are "NO", then may proceed with Cephalosporin use.   Cheese Other (See Comments)   Breaks face out   Latex Itching, Swelling   Sulfa Antibiotics Swelling   Vicodin [hydrocodone-acetaminophen] Hives        Medication List     TAKE these medications    acetaminophen 500 MG tablet Commonly known as: TYLENOL Take 1,000 mg by mouth every 6 (six) hours as needed for headache.   albuterol 108 (90 Base) MCG/ACT inhaler Commonly known as: VENTOLIN HFA Inhale 2 puffs into the lungs every 6 (six) hours as needed for wheezing or shortness of breath.   Blood Pressure Cuff Misc 1 Device by Does not apply route once a week.   Blood Pressure Kit Devi 1  Device by Does not apply route once a week.   cyclobenzaprine 10 MG tablet Commonly known as: FLEXERIL Take 1 tablet (10 mg total) by mouth every 8 (eight) hours as needed for muscle spasms.   famotidine 20 MG tablet Commonly known as: PEPCID Take 1 tablet (20 mg total) by mouth 2 (two) times daily.   ferrous sulfate 325 (65 FE) MG tablet Take 1 tablet (325 mg total) by mouth 2 (two) times daily with a meal.   lamoTRIgine 25 MG tablet Commonly known as: LAMICTAL Take 1 tablet (25 mg total) by mouth daily for 7 days, THEN 1 tablet (25 mg total) 2 (two) times daily for 7 days, THEN 2 tablets (50 mg total) 2 (two) times daily for 7 days. Start taking on: November 09, 2020   lamoTRIgine 100 MG tablet Commonly known as: LaMICtal Take 1 tablet (100 mg total) by mouth 2 (two) times daily.   levETIRAcetam 750 MG tablet Commonly known as: KEPPRA Take 2 tablets (1,500 mg total) by mouth 2 (two) times daily.   metroNIDAZOLE 500 MG tablet Commonly known as: FLAGYL Take 1 tablet (500 mg total) by mouth 2 (two) times daily for 7 days.   ondansetron 4 MG disintegrating tablet Commonly known as: Zofran ODT Take 1 tablet (4 mg total) by mouth every 8 (eight) hours as needed for nausea or vomiting.   polyethylene glycol 17 g packet Commonly known as: MIRALAX / GLYCOLAX Take 17 g by mouth 2 (two) times daily.   PrePLUS 27-1 MG Tabs Take 1 tablet by mouth daily.   topiramate 100 MG tablet Commonly known as: TOPAMAX Take one o qAM and two po qHS        Follow-up Information     Woodroe Mode, MD. Call in 1 day(s).   Specialty: Obstetrics and Gynecology Why: to let them know you were here Contact information: [place] [id number] [phone]                 Signed:  Regina Eck 02/22/2021 8:03 PM

## 2021-02-22 NOTE — Progress Notes (Signed)
Discharge instructions provided to patient. Patient verbalized understanding. Pt educated on signs and symptoms of labor, vaginal bleeding, LOF, and fetal movement. Red flag signs reviewed by RN. Patient discharged home in stable condition. 

## 2021-03-01 ENCOUNTER — Ambulatory Visit (INDEPENDENT_AMBULATORY_CARE_PROVIDER_SITE_OTHER): Payer: Managed Care, Other (non HMO) | Admitting: Licensed Clinical Social Worker

## 2021-03-01 DIAGNOSIS — O99891 Other specified diseases and conditions complicating pregnancy: Secondary | ICD-10-CM | POA: Diagnosis not present

## 2021-03-01 DIAGNOSIS — Z8659 Personal history of other mental and behavioral disorders: Secondary | ICD-10-CM | POA: Diagnosis not present

## 2021-03-03 ENCOUNTER — Telehealth: Payer: Self-pay

## 2021-03-03 NOTE — BH Specialist Note (Signed)
°     Integrated Behavioral Health via Telemedicine Visit  03/03/2021 ROMONIA YANIK [id number]  Number of Integrated Behavioral Health Clinician visits: 2 Session Start time: 10:00am  Session End time: 10:19am Total time in minutes: 19 mins via phone due to pt not feeling well   Referring Provider: Dr. Roselie Awkward  Patient/Family location: Home  [facility] Provider location: [place]  All persons participating in visit: Pt D Castile and LCSW A. Cahlil Sattar  Types of Service: Individual psychotherapy and Telephone visit  I connected with [name] and/or [place] via  Telephone or Video Enabled Telemedicine Application  (Video is Caregility application) and verified that I am speaking with the correct person using two identifiers. Discussed confidentiality: Yes   I discussed the limitations of telemedicine and the availability of in person appointments.  Discussed there is a possibility of technology failure and discussed alternative modes of communication if that failure occurs.  I discussed that engaging in this telemedicine visit, they consent to the provision of behavioral healthcare and the services will be billed under their insurance.  Patient and/or legal guardian expressed understanding and consented to Telemedicine visit: Yes   Presenting Concerns: Patient and/or family reports the following symptoms/concerns: history of postpartum depression Duration of problem: over one year; Severity of problem: mild  Patient and/or Family's Strengths/Protective Factors: Concrete supports in place (healthy food, safe environments, etc.)  Goals Addressed: Patient will:  Reduce symptoms of: depression   Increase knowledge and/or ability of: coping skills   Demonstrate ability to: Increase healthy adjustment to current life circumstances  Progress towards Goals: Ongoing  Interventions: Interventions utilized:  Supportive Counseling Standardized Assessments completed: n/a  Patient  and/or Family Response: Ms. Mausolf report experiencing a migraine  Assessment: Patient has a history of postpartum depression. Ms. Verne reports improvement with mood and support from partner. Ms. Cabler reports incorporating self care and maintaining boundaries with family members .   Patient may benefit from integrated behavioral health.  Plan: Follow up with behavioral health clinician on : 3 weeks Behavioral recommendations: Prioritize rest, communicate need with fiance for added support, create and sustain boundaries, take prenatal vitamins, attend schedule medical appts, engage in stress reducing activities and mindfulness to prevent burnout Referral(s): [place] (In Clinic)  I discussed the assessment and treatment plan with the patient and/or parent/guardian. They were provided an opportunity to ask questions and all were answered. They agreed with the plan and demonstrated an understanding of the instructions.   They were advised to call back or seek an in-person evaluation if the symptoms worsen or if the condition fails to improve as anticipated.  Lynnea Ferrier, LCSW

## 2021-03-03 NOTE — Telephone Encounter (Signed)
Received short term disability form from [employer]. LVM advising patient of the form fee

## 2021-03-08 ENCOUNTER — Ambulatory Visit (INDEPENDENT_AMBULATORY_CARE_PROVIDER_SITE_OTHER): Payer: Medicaid Other | Admitting: Obstetrics & Gynecology

## 2021-03-08 ENCOUNTER — Other Ambulatory Visit: Payer: Self-pay

## 2021-03-08 VITALS — BP 115/83 | HR 90 | Wt 323.0 lb

## 2021-03-08 DIAGNOSIS — O0990 Supervision of high risk pregnancy, unspecified, unspecified trimester: Secondary | ICD-10-CM

## 2021-03-08 DIAGNOSIS — G40909 Epilepsy, unspecified, not intractable, without status epilepticus: Secondary | ICD-10-CM

## 2021-03-08 DIAGNOSIS — O99353 Diseases of the nervous system complicating pregnancy, third trimester: Secondary | ICD-10-CM

## 2021-03-08 DIAGNOSIS — Z0289 Encounter for other administrative examinations: Secondary | ICD-10-CM

## 2021-03-08 MED ORDER — PANTOPRAZOLE SODIUM 40 MG PO TBEC: 40.0000 mg | DELAYED_RELEASE_TABLET | Freq: Every day | ORAL | 4 refills | Status: DC

## 2021-03-08 NOTE — Telephone Encounter (Signed)
Per Hilda Blades, payment was taken earlier today as well. Will wait on billing to see if it posted before giving them another copy

## 2021-03-08 NOTE — Progress Notes (Signed)
Pt needs Rx for acid reflux, pt states Pepcid does not help. Pt states she is having some "sticky" discharge.

## 2021-03-08 NOTE — Progress Notes (Signed)
° °  PRENATAL VISIT NOTE  Subjective:  Latoya Stark is a 26 y.o. [phone] at [redacted]w[redacted]d being seen today for ongoing prenatal care.  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  Patient reports heartburn and 3 seizures in the last month .  Contractions: Not present. Vag. Bleeding: None.  Movement: Present. Denies leaking of fluid.   The following portions of the patient's history were reviewed and updated as appropriate: allergies, current medications, past family history, past medical history, past social history, past surgical history and problem list.   Objective:   Vitals:   03/08/21 1055  BP: 115/83  Pulse: 90  Weight: (!) 323 lb (146.5 kg)    Fetal Status:     Movement: Present     General:  Alert, oriented and cooperative. Patient is in no acute distress.  Skin: Skin is warm and dry. No rash noted.   Cardiovascular: Normal heart rate noted  Respiratory: Normal respiratory effort, no problems with respiration noted  Abdomen: Soft, gravid, appropriate for gestational age.  Pain/Pressure: Present     Pelvic: Cervical exam deferred        Extremities: Normal range of motion.     Mental Status: Normal mood and affect. Normal behavior. Normal judgment and thought content.   Assessment and Plan:  Pregnancy: [id number] at [redacted]w[redacted]d 1. Supervision of high risk pregnancy, antepartum heartburn - pantoprazole (PROTONIX) 40 MG tablet; Take 1 tablet (40 mg total) by mouth daily.  Dispense: 30 tablet; Refill:  4 Seizure disorder during pregnancy in third trimester [facility])  Supervision of high risk pregnancy, antepartum - Plan: pantoprazole (PROTONIX) 40 MG tablet  Morbid obesity [facility]) Neurology f/u 3/21 reinforced Preterm labor symptoms and general obstetric precautions including but not limited to vaginal bleeding, contractions, leaking of fluid and fetal movement were reviewed in detail with the patient. Please refer to After Visit Summary for other counseling recommendations.   Return in about 4 weeks (around 04/05/2021).  Future Appointments  Date Time Provider [place]  03/16/2021 12:30 PM [facility] NURSE [facility]  03/16/2021 12:45 PM [facility]  03/22/2021 11:00 AM Lynnea Ferrier, LCSW [facility] None  03/29/2021 11:30 AM Sater, Nanine Means, MD [facility] None  04/05/2021  8:55 AM Woodroe Mode, MD [place] None  04/19/2021 11:15 AM Luvenia Redden, PA-C [facility] None  05/03/2021 11:15 AM Constant, Vickii Chafe, MD [facility] None    Emeterio Reeve, MD

## 2021-03-08 NOTE — Telephone Encounter (Signed)
Patient called back and paid form fee. Paperwork given to POD 1

## 2021-03-10 ENCOUNTER — Telehealth: Payer: Self-pay

## 2021-03-10 NOTE — Telephone Encounter (Signed)
I called pt to further discuss. She states her work denied request/form that Dr. Felecia Shelling filled out for her on 11/25/21. 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 States she has not been working and has not been paid for the month of Feb. She is unable to pay bills. Phone will cut off tomorrow. Other utilities will be cut off. Advised I did now see any communication about this since we filled out form back in November. She states Shawna Orleans told her they sent [place] communication about this.  ? ?She works w/ [employer]. Makes hand sanitizer/powders. I advised since last visit 09/2020, will need  to bring her in for appt for Dr. Felecia Shelling to discuss/do updated exam. I offered 03/15/21 at 930am for work in appt. She asked for sooner appt, relayed there was nothing sooner at this time. ?She expressed dissatisfaction, used profanities, stated she will be here for appt on 03/15/21 at 9:30am and ended call.   ? ?I will forward call and ppw to MD for review.  ?

## 2021-03-10 NOTE — Telephone Encounter (Signed)
Patient called in to check on the status of her disability paperwork. She advised that she needs this done urgently as she is unable to get benefits until this is completed. She also asked about paperwork allowing her to go back to work. ? ?She also advised that she had an episode three days ago and two more a week prior to that, and patients sister reminded her to let us know. Wants to know if a nurse can give her a call back to discuss these episodes. ? ?Best call back number [phone] ?

## 2021-03-15 ENCOUNTER — Other Ambulatory Visit: Payer: Self-pay

## 2021-03-15 ENCOUNTER — Ambulatory Visit: Payer: Medicaid Other | Admitting: Neurology

## 2021-03-15 ENCOUNTER — Encounter: Payer: Self-pay | Admitting: Neurology

## 2021-03-15 VITALS — BP 123/92 | HR 99 | Ht 63.0 in | Wt 324.0 lb

## 2021-03-15 DIAGNOSIS — G43709 Chronic migraine without aura, not intractable, without status migrainosus: Secondary | ICD-10-CM | POA: Diagnosis not present

## 2021-03-15 DIAGNOSIS — R29898 Other symptoms and signs involving the musculoskeletal system: Secondary | ICD-10-CM | POA: Diagnosis not present

## 2021-03-15 DIAGNOSIS — Z3490 Encounter for supervision of normal pregnancy, unspecified, unspecified trimester: Secondary | ICD-10-CM

## 2021-03-15 DIAGNOSIS — G40409 Other generalized epilepsy and epileptic syndromes, not intractable, without status epilepticus: Secondary | ICD-10-CM

## 2021-03-15 MED ORDER — LAMOTRIGINE 200 MG PO TABS: 100.0000 mg | ORAL_TABLET | Freq: Two times a day (BID) | ORAL | 11 refills | Status: DC

## 2021-03-15 NOTE — Progress Notes (Signed)
[facility]  PATIENT: Latoya Stark DOB: [birth year]  REFERRING DOCTOR OR PCP:  Harle Battiest is Latoya Stark SOURCE: patient, fiance, notes from ED, labs  _________________________________   HISTORICAL  CHIEF COMPLAINT:  Chief Complaint  Patient presents with   Follow-up    Rm 1, alone. Here for sz f/u. Pt is here to discuss filling out out STD papers. Pt has a migraine today and causing blurry vision. Pt repots having a sz 3 days ago, feeling sleepy and drowsy. Other have been every few weeks.     HISTORY OF PRESENT ILLNESS:  Latoya Stark is a 26 y.o. woman with headaches and seizures.  UPDATE 03/15/2021:  She is currently 5 months pregnant.   She plans to breast x 6 months.     She brother reports she has a seizure with shaking every 2-4 weeks.   The shaking lasts one minute and she is unresponsive afterwards for several minutes.    She takes Keppra 1500 mg twice a day and lamotigine 100 mg po bid.    She had een on topiramate and it was d/c early in pregnancy (as lamotrigine added).        She also gets a lot of headaches.   Topiramate had not helped.   With a headache, she gets some nausea but has a lot of photophobia (no phonophobia).     She was having left sided numbness at last visit.   MRI 11/2020 was normal.    She gets intermittent left leg numbness since 4-5 days ago.   She also had left leg numbness when pregnant in 2020/2021    At that time (02/10/2019) she had normal MRI of the cervical, thoracic and lumbar spine).  MRi brain 02/09/2019 was also normal.    Seizure history: She has had seizures since age 26.    In 05/2018,  she was sitting in the car talking to her boyfriend and then seized (2-3 minutes GTC) and was taken to the ED,    She reports being told that she was slurring her words and not making sense for a lttle bit before the seizure.  She also complained of a headache.    She went to the ED and was placed on Keppra.   She had another seizure  6/8/2022She was taken to [facility] ED.   She had CT head and cervical spine and had xrays of right shoulder and lumbar spine.     She had been on Keppra 1500 mg bid.     Her level was good at 23.7 (10-40 is range)  She is having a lot of headaches.   They are located left frontal or bitemporal.   Quality is pounding.     She is having 6 sever HA a month.   She notes lights sometimes trigger them.   When present, she has photophobia (not much phonophobia), nausea and rare vomiting.  Sometimes she feels lightheaded.  Moving worsens the pain.     I personally reviewed the CT scan of the head and cervical spine from 06/16/2020.  Both are normal.  There are no acute findings.   Laboratory tests from 06/16/2020 were also reviewed.  The Tox  screen showed benzodiazepines (received some before the test due to seizure).  CMP, CBC, hCG were negative or noncontributory   Seizure History She had her first seizure at age 26.  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  She has been having one every 2-3 months, even while on treatment.  She stopped Dilantin in 2020 (10 days or so before the seizure prompting initial consult with me) due to pregnancy.    I switched her to Keppra, initially 750 mg twice daily and she was titrated up to 1500 mg twice daily due to breakthrough activity.   She had additional breakthrough seizures in 2022 and on 06/22/2020 Topamax 100 mg twice daily was added.  HA history She has had migraine headaches since her teen years and the frequency increased in 2020 to become daily.  Generally, headaches are located bilaterally, forehead > temporal region.    She gets nausea, severe photophobia and minimal phonophobia.    Headaches usually improve if she sleeps them off.    Heat triggers the headache.  Light sometimes triggers the headaches Tylenol sometimes stops the headaches.   Topamax had helped her headaches with benefit in the past.   Keppra initially helped the headaches.Marland Kitchen       REVIEW OF SYSTEMS: Constitutional: No fevers, chills, sweats, or change in appetite Eyes: No visual changes, double vision, eye pain Ear, nose and throat: No hearing loss, ear pain, nasal congestion, sore throat Cardiovascular: No chest pain, palpitations Respiratory:  No shortness of breath at rest or with exertion.   No wheezes GastrointestinaI: She has had episodes of abdominal pain in the past.  No nausea, vomiting, diarrhea, fecal incontinence Genitourinary:  No dysuria, urinary retention or frequency.  No nocturia. Musculoskeletal:  No neck pain, back pain Integumentary: No rash, pruritus, skin lesions Neurological: as above Psychiatric: No depression at this time.  No anxiety Endocrine: No palpitations, diaphoresis, change in appetite, change in weigh or increased thirst Hematologic/Lymphatic:  No anemia, purpura, petechiae. Allergic/Immunologic: No itchy/runny eyes, nasal congestion, recent allergic reactions, rashes  ALLERGIES: Allergies  Allergen Reactions   Milk-Related Compounds Dermatitis    Breaks face out *can have 2% milk*   Mushroom Extract Complex Anaphylaxis   Penicillins Anaphylaxis    Has patient had a PCN reaction causing immediate rash, facial/tongue/throat swelling, SOB or lightheadedness with hypotension: Yes Has patient had a PCN reaction causing severe rash involving mucus membranes or skin necrosis: No Has patient had a PCN reaction that required hospitalization No Has patient had a PCN reaction occurring within the last 10 years: Yes If all of the above answers are "NO", then may proceed with Cephalosporin use.    Cheese Other (See Comments)    Breaks face out   Latex Itching and Swelling   Sulfa Antibiotics Swelling   Vicodin [Hydrocodone-Acetaminophen] Hives    HOME MEDICATIONS:  Current Outpatient Medications:    acetaminophen (TYLENOL) 500 MG tablet, Take 1,000 mg by mouth every 6 (six) hours as  needed for headache., Disp: , Rfl:    albuterol (PROVENTIL HFA;VENTOLIN HFA) 108 (90 Base) MCG/ACT inhaler, Inhale 2 puffs into the lungs every 6 (six) hours as needed for wheezing or shortness of breath., Disp: 1 Inhaler, Rfl: 0   Blood Pressure Monitoring (BLOOD PRESSURE CUFF) MISC, 1 Device by Does not apply route once a week., Disp: 1 each, Rfl: 0   Blood Pressure Monitoring (BLOOD PRESSURE KIT) DEVI, 1 Device by Does not apply route once a week., Disp: 1 each, Rfl: 0   cyclobenzaprine (FLEXERIL) 10 MG tablet, Take 1 tablet (10 mg total) by mouth every 8 (eight) hours as needed for muscle spasms., Disp: 30 tablet, Rfl: 1   famotidine (PEPCID) 20 MG tablet, Take 1 tablet (  20 mg total) by mouth 2 (two) times daily., Disp: 30 tablet, Rfl: 3   ferrous sulfate 325 (65 FE) MG tablet, Take 1 tablet (325 mg total) by mouth 2 (two) times daily with a meal., Disp: 60 tablet, Rfl: 5   levETIRAcetam (KEPPRA) 750 MG tablet, Take 2 tablets (1,500 mg total) by mouth 2 (two) times daily., Disp: 120 tablet, Rfl: 11   ondansetron (ZOFRAN ODT) 4 MG disintegrating tablet, Take 1 tablet (4 mg total) by mouth every 8 (eight) hours as needed for nausea or vomiting., Disp: 12 tablet, Rfl: 0   pantoprazole (PROTONIX) 40 MG tablet, Take 1 tablet (40 mg total) by mouth daily., Disp: 30 tablet, Rfl: 4   polyethylene glycol (MIRALAX / GLYCOLAX) 17 g packet, Take 17 g by mouth 2 (two) times daily., Disp: 24 each, Rfl: 3   Prenatal Vit-Fe Fumarate-FA (PREPLUS) 27-1 MG TABS, Take 1 tablet by mouth daily., Disp: 30 tablet, Rfl: 13   lamoTRIgine (LAMICTAL) 200 MG tablet, Take 0.5 tablets (100 mg total) by mouth 2 (two) times daily., Disp: 60 tablet, Rfl: 11  PAST MEDICAL HISTORY: 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[place]) 08/01/2018    Headache    Obesity    Seizures ([place])    "when I get too hot"   Trichomonas infection    UTI (lower urinary tract infection)     PAST SURGICAL HISTORY: Past Surgical History:  Procedure Laterality Date   ADENOIDECTOMY     DILATION AND EVACUATION N/A 08/09/2013   Procedure: DILATATION AND EVACUATION;  Surgeon: Frederico Hamman, MD;  Location: [facility];  Service: Gynecology;  Laterality: N/A;   ORTHOPEDIC SURGERY Left    left hand   TONSILLECTOMY      FAMILY HISTORY: Family History  Problem Relation Age of Onset   Cancer Mother    Seizures Mother    Stroke Mother    Sickle cell anemia Mother    Hypertension Father    Seizures Maternal Aunt    Diabetes Maternal Grandmother    Colon cancer Maternal Grandmother    Hypertension Maternal Grandmother    Clotting disorder Maternal Grandmother    Breast cancer Maternal Grandmother    Stroke Maternal Grandmother    Diabetes Paternal Grandmother     SOCIAL HISTORY:  Social History   Socioeconomic History   Marital status: Single    Spouse name: Not on file   Number of children: 1   Years of education: Not on file   Highest education level: Not on file  Occupational History   Not on file  Tobacco Use   Smoking status: Never   Smokeless tobacco: Never  Vaping Use   Vaping Use: Never used  Substance and Sexual Activity   Alcohol use: Not Currently    Comment: occ   Drug use: No   Sexual activity: Yes    Partners: Male    Birth control/protection: None    Comment: one partner  Other Topics Concern   Not on file  Social History Narrative   Caffeine: tea sometimes   Lives with fiance   Right handed    Social Determinants of Health   Financial Resource Strain: Not on file  Food Insecurity: Not on file  Transportation Needs: Not  on file  Physical Activity: Not on file  Stress: Not on file  Social Connections: Not on file  Intimate Partner Violence: Not on file     PHYSICAL EXAM  Vitals:   03/15/21 0910   BP: (!) 123/92  Pulse: 99  Weight: (!) 324 lb (147 kg)  Height: '5\' 3"'  (1.6 m)    Body mass index is 57.39 kg/m.   General: The patient is well-developed and well-nourished and in no acute distress  HEENT:  Head is /AT.  Sclera are anicteric.    Skin: Extremities are without rash or  edema.  Neurologic Exam  Mental status: The patient is alert and oriented x 3 at the time of the examination. The patient has apparent normal recent and remote memory, with an apparently normal attention span and concentration ability.   Speech is normal.  Cranial nerves: Extraocular movements are full. Facial strength and sensation are normal.    No obvious hearing deficits are noted.  Motor:  Muscle bulk is normal.   Tone is normal. Strength is  5 / 5 in arms and right  leg and 4/5 in eft leg   Sensory: Sensory testing is intact to pinprick, soft touch and vibration sensation in arms and riht leg but reduced to touch, temp and vibraion in entire left leg.    Coordination: Cerebellar testing reveals good finger-nose-finger and heel-to-shin bilaterally.  Gait and station: Station is normal.   Gait and tandem gait were normal.Romberg is negative  Reflexes: Deep tendon reflexes are symmetric and normal bilaterally.       DIAGNOSTIC DATA (LABS, IMAGING, TESTING) - I reviewed patient records, labs, notes, testing and imaging myself where available.  Lab Results  Component Value Date   WBC 6.1 12/30/2020   HGB 12.0 12/30/2020   HCT 35.3 12/30/2020   MCV 81 12/30/2020   PLT 324 12/30/2020      Component Value Date/Time   NA 137 12/30/2020 1153   K 4.1 12/30/2020 1153   CL 103 12/30/2020 1153   CO2 19 (L) 12/30/2020 1153   GLUCOSE 79 12/30/2020 1153   GLUCOSE 86 11/07/2020 1425   BUN 5 (L) 12/30/2020 1153   CREATININE 0.53 (L) 12/30/2020 1153   CALCIUM 9.3 12/30/2020 1153   PROT 6.8 12/30/2020 1153   ALBUMIN 4.1 12/30/2020 1153   AST 33 12/30/2020 1153   ALT 51 (H) 12/30/2020 1153    ALKPHOS 106 12/30/2020 1153   BILITOT <0.2 12/30/2020 1153   GFRNONAA >60 11/07/2020 1425   GFRAA >60 02/20/2019 2329   Lab Results  Component Value Date   CHOL  12/13/2006    122        ATP III CLASSIFICATION:  <200     mg/dL   Desirable  200-239  mg/dL   Borderline High  >=240    mg/dL   High   HDL 30 (L) 12/13/2006   LDLCALC  12/13/2006    76        Total Cholesterol/HDL:CHD Risk Coronary Heart Disease Risk Table                     Men   Women  1/2 Average Risk   3.4   3.3   TRIG 78 12/13/2006   CHOLHDL 4.1 12/13/2006   Lab Results  Component Value Date   HGBA1C 5.7 (H) 12/30/2020   Lab Results  Component Value Date   VITAMINB12 309 02/24/2014   Lab Results  Component Value Date  TSH 1.770 03/15/2018       ASSESSMENT AND PLAN  1. 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 Chronic migraine without aura without status migrainosus, not intractable   3. Left leg weakness   4. Pregnancy, unspecified gestational age      She will continue Keppra 1500 mg twice daily.  Increase lamotrigine to 200 mg po bid.   She had breakthrough seizures on lower dose.   2.  Stay active and exercise as tolerated.      Continue prenatal care with OB 3.   Avoid tramadol (has some but not taken recently).     Consider an anti-CGRP agent monthly after delivery  4.   Filled out [employer] form restrictions 03/15/2021 to 07/23/2021.   Light duty due to high risk pregnancy.  No heavy/hazardous equipment (due to seizures), no hazardous materials due to seizures and high risk pregnancy.    5.   She will return to see [place] in 6 months but call if she has new or worsening neurologic symptoms.   Keani Gotcher A. Felecia Shelling, MD, [facility] 5/0/5397, 6:73 AM Certified in Neurology, Clinical Neurophysiology, Sleep Medicine and Neuroimaging  [facility] [address], [place],  [address] [phone]

## 2021-03-16 ENCOUNTER — Other Ambulatory Visit: Payer: Self-pay | Admitting: Obstetrics and Gynecology

## 2021-03-16 ENCOUNTER — Ambulatory Visit (HOSPITAL_BASED_OUTPATIENT_CLINIC_OR_DEPARTMENT_OTHER): Payer: Medicaid Other | Admitting: Maternal & Fetal Medicine

## 2021-03-16 ENCOUNTER — Other Ambulatory Visit: Payer: Self-pay | Admitting: *Deleted

## 2021-03-16 ENCOUNTER — Other Ambulatory Visit: Payer: Self-pay

## 2021-03-16 ENCOUNTER — Ambulatory Visit: Payer: Medicaid Other | Attending: Obstetrics and Gynecology

## 2021-03-16 ENCOUNTER — Ambulatory Visit: Payer: Medicaid Other | Admitting: *Deleted

## 2021-03-16 VITALS — BP 116/62 | HR 100

## 2021-03-16 DIAGNOSIS — O0990 Supervision of high risk pregnancy, unspecified, unspecified trimester: Secondary | ICD-10-CM | POA: Diagnosis present

## 2021-03-16 DIAGNOSIS — O99352 Diseases of the nervous system complicating pregnancy, second trimester: Secondary | ICD-10-CM | POA: Diagnosis not present

## 2021-03-16 DIAGNOSIS — Z362 Encounter for other antenatal screening follow-up: Secondary | ICD-10-CM | POA: Diagnosis present

## 2021-03-16 DIAGNOSIS — G40909 Epilepsy, unspecified, not intractable, without status epilepticus: Secondary | ICD-10-CM | POA: Diagnosis not present

## 2021-03-16 DIAGNOSIS — Z3A23 23 weeks gestation of pregnancy: Secondary | ICD-10-CM

## 2021-03-16 DIAGNOSIS — Z364 Encounter for antenatal screening for fetal growth retardation: Secondary | ICD-10-CM | POA: Insufficient documentation

## 2021-03-16 DIAGNOSIS — O99212 Obesity complicating pregnancy, second trimester: Secondary | ICD-10-CM

## 2021-03-16 DIAGNOSIS — O365921 Maternal care for other known or suspected poor fetal growth, second trimester, fetus 1: Secondary | ICD-10-CM

## 2021-03-16 DIAGNOSIS — O26842 Uterine size-date discrepancy, second trimester: Secondary | ICD-10-CM

## 2021-03-16 NOTE — Progress Notes (Addendum)
MFM Brief Note ? ?Latoya Stark is a 26 yo G8P1 who is here for a follow up growth at the request of Dr. Emeterio Reeve.  ? ? ?Single intrauterine pregnancy here for a follow up growth due to elevated BMI and seizure disorder. ? ?Normal anatomy with measurements less than dates due to IUGR EFW 9th%.  ?There is good fetal movement and amniotic fluid volume ?The UA Dopplers are normal without evidence of AEDF or REDF. ? ?I discussed today's visit with a diagnosis of FGR. I explained that the etiology includes placental insufficiency, chronic disease, infection, aneuploidy and other genetic syndromes. She has a low risk NIPS and neg horizon. She has no additional risk factors for chronic disease. At this time I explained the diagnosis, evaluation and management to include on serial fetal growth and antenatal testing to include UA Dopplers.  ? ?If the EFW < 3rd% or abnormal testing, I recommend delivery at 37 weeks otherwise if fetal growth is normal consider delivery at 39 weeks.  ? ?Given her early gestational age and normal UA Dopplers I have scheduled her to return in 2 weeks with a repeat growth in 3 weeks. ? ?I spent 20 minutes with > 50% in face to face consultation. ? ?Vikki Ports, MD ?

## 2021-03-17 ENCOUNTER — Other Ambulatory Visit: Payer: Self-pay | Admitting: *Deleted

## 2021-03-17 ENCOUNTER — Other Ambulatory Visit: Payer: Self-pay

## 2021-03-17 ENCOUNTER — Telehealth: Payer: Self-pay

## 2021-03-17 DIAGNOSIS — N760 Acute vaginitis: Secondary | ICD-10-CM

## 2021-03-17 DIAGNOSIS — O0990 Supervision of high risk pregnancy, unspecified, unspecified trimester: Secondary | ICD-10-CM

## 2021-03-17 MED ORDER — CITRANATAL BLOOM 90-1 MG PO TABS: 1.0000 | ORAL_TABLET | Freq: Every day | ORAL | 11 refills | Status: DC

## 2021-03-17 MED ORDER — METRONIDAZOLE 0.75 % VA GEL: 1.0000 | Freq: Every day | VAGINAL | 1 refills | Status: DC

## 2021-03-17 MED ORDER — PREPLUS 27-1 MG PO TABS: 1.0000 | ORAL_TABLET | Freq: Every day | ORAL | 13 refills | Status: DC

## 2021-03-17 NOTE — Telephone Encounter (Signed)
Patient called and left message stating that she had some questions. Did not leave any other details. Attempted to call patient back x2. No answer. Left HIPPA compliant vm for patient to return call to the office. ?

## 2021-03-17 NOTE — Progress Notes (Signed)
Notice from pharmacy that Citranatal Bloom is no longer available. RX Preplus sent. ?

## 2021-03-19 ENCOUNTER — Encounter: Payer: Self-pay | Admitting: Obstetrics and Gynecology

## 2021-03-19 ENCOUNTER — Observation Stay
Admission: EM | Admit: 2021-03-19 | Discharge: 2021-03-19 | Disposition: A | Discharge status: Home / Self Care | Payer: Medicaid Other | Attending: Obstetrics | Admitting: Obstetrics

## 2021-03-19 ENCOUNTER — Observation Stay: Payer: Medicaid Other

## 2021-03-19 DIAGNOSIS — Z91040 Latex allergy status: Secondary | ICD-10-CM | POA: Insufficient documentation

## 2021-03-19 DIAGNOSIS — Z3A24 24 weeks gestation of pregnancy: Secondary | ICD-10-CM | POA: Insufficient documentation

## 2021-03-19 DIAGNOSIS — O99212 Obesity complicating pregnancy, second trimester: Secondary | ICD-10-CM | POA: Insufficient documentation

## 2021-03-19 DIAGNOSIS — J45909 Unspecified asthma, uncomplicated: Secondary | ICD-10-CM | POA: Insufficient documentation

## 2021-03-19 DIAGNOSIS — O368190 Decreased fetal movements, unspecified trimester, not applicable or unspecified: Secondary | ICD-10-CM | POA: Diagnosis present

## 2021-03-19 DIAGNOSIS — O4690 Antepartum hemorrhage, unspecified, unspecified trimester: Secondary | ICD-10-CM

## 2021-03-19 DIAGNOSIS — O99512 Diseases of the respiratory system complicating pregnancy, second trimester: Secondary | ICD-10-CM | POA: Insufficient documentation

## 2021-03-19 DIAGNOSIS — Z79899 Other long term (current) drug therapy: Secondary | ICD-10-CM | POA: Insufficient documentation

## 2021-03-19 DIAGNOSIS — O4692 Antepartum hemorrhage, unspecified, second trimester: Secondary | ICD-10-CM | POA: Insufficient documentation

## 2021-03-19 DIAGNOSIS — Z6841 Body Mass Index (BMI) 40.0 and over, adult: Secondary | ICD-10-CM | POA: Insufficient documentation

## 2021-03-19 DIAGNOSIS — Z7951 Long term (current) use of inhaled steroids: Secondary | ICD-10-CM | POA: Insufficient documentation

## 2021-03-19 DIAGNOSIS — O0990 Supervision of high risk pregnancy, unspecified, unspecified trimester: Secondary | ICD-10-CM

## 2021-03-19 DIAGNOSIS — O368120 Decreased fetal movements, second trimester, not applicable or unspecified: Principal | ICD-10-CM | POA: Insufficient documentation

## 2021-03-19 DIAGNOSIS — O365920 Maternal care for other known or suspected poor fetal growth, second trimester, not applicable or unspecified: Secondary | ICD-10-CM | POA: Insufficient documentation

## 2021-03-19 LAB — CHLAMYDIA/NGC RT PCR (ARMC ONLY)
Chlamydia Tr: NOT DETECTED
N gonorrhoeae: NOT DETECTED

## 2021-03-19 LAB — WET PREP, GENITAL
Sperm: NONE SEEN
Trich, Wet Prep: NONE SEEN
WBC, Wet Prep HPF POC: <10 (ref <10)
Yeast Wet Prep HPF POC: NONE SEEN

## 2021-03-19 MED ORDER — ACETAMINOPHEN 500 MG PO TABS
1000.0000 mg | ORAL_TABLET | ORAL | Status: DC | PRN
  Administered 2021-03-19: 1000 mg via ORAL
  Filled 2021-03-19: qty 2

## 2021-03-19 MED ORDER — METRONIDAZOLE 500 MG PO TABS: 500.0000 mg | ORAL_TABLET | Freq: Two times a day (BID) | ORAL | 0 refills | Status: AC

## 2021-03-19 MED ORDER — DOCUSATE SODIUM 100 MG PO CAPS: 100.0000 mg | ORAL_CAPSULE | Freq: Every day | ORAL | Status: DC

## 2021-03-19 MED ORDER — ZOLPIDEM TARTRATE 5 MG PO TABS: 5.0000 mg | ORAL_TABLET | Freq: Every evening | ORAL | Status: DC | PRN

## 2021-03-19 MED ORDER — PRENATAL MULTIVITAMIN CH: 1.0000 | ORAL_TABLET | Freq: Every day | ORAL | Status: DC

## 2021-03-19 MED ORDER — CALCIUM CARBONATE ANTACID 500 MG PO CHEW: 2.0000 | CHEWABLE_TABLET | ORAL | Status: DC | PRN

## 2021-03-19 NOTE — OB Triage Note (Addendum)
Patient to Latoya Stark with complaint of vaginal bleeding and decreased fetal movement.  She reports that bleeding began around 1:30 this am and was noticed upon wiping when in the bathroom.  She reports that it was a brownish-red and she never had to wear a pad.  She reports that baby's movements have been decreased over the last 2 days.  Patient is considered high risk due to history of seizures.  Her last reported seizure was approximately 2 weeks ago.  Sterile speculum exam by Corlis Leak, specimens collected, [place] ordered.  [place] completed.  Labs reviewed with patient by provider. Treatment prescription for BV called to pharmacy. ?

## 2021-03-19 NOTE — Discharge Summary (Signed)
Latoya Stark is a 26 y.o. female. She is at 4w0dgestation. Patient's last menstrual period was 10/02/2020 (approximate). ?Estimated Date of Delivery: 07/09/21 ? ?[facility] [place] ?Chief complaint: decreased fetal movement and light vaginal bleeding that started yesterday ? ?HPI: DDeepapresents to L&D with complaints of decreased FM and light vaginal bleeding ? ?Factors complicating pregnancy: ?Morbid obesity during pregnancy BMI 56.26 ?Seizure disorder, last seizure 2 weeks ago ?IUGR 9th %tile, normal UA dopplers without evidence of AEDF or REDF-03/16/21, repeat growth in 3 weeks ? ?S: Resting comfortably. no CTX, no VB.no LOF,  Active fetal movement.  ? ?Maternal Medical History:  ?Past Medical Hx:  has a past medical history of ADHD (attention deficit hyperactivity disorder), Anemia, Anxiety, Asthma, Chronic migraine (08/01/2018), Depression, Gonorrhea in female (10/05/2013), 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 (08/01[place]), Headache, Obesity, Seizures ([place], Trichomonas infection, and UTI (lower urinary tract infection).   ? ?Past Surgical Hx:  has a past surgical history that includes orthopedic surgery (Left); Adenoidectomy; Dilation and evacuation (N/A, 08/09/2013); and Tonsillectomy.  ? ?Allergies  ?Allergen Reactions  ? Milk-Related Compounds Dermatitis  ?  Breaks face out ?*can have 2% milk*  ? Mushroom Extract Complex Anaphylaxis  ? Penicillins Anaphylaxis  ?  Has patient had a PCN reaction causing immediate rash, facial/tongue/throat swelling, SOB or lightheadedness with hypotension: Yes ?Has patient had a PCN reaction causing severe rash involving mucus membranes or skin necrosis: No ?Has patient had a PCN reaction that required hospitalization No ?Has patient had a PCN reaction occurring within the last 10 years: Yes ?If all of the above answers are "NO", then may proceed with Cephalosporin use. ?  ? Cheese Other (See Comments)  ?  Breaks face out   ? Latex Itching and Swelling  ? Sulfa Antibiotics Swelling  ? Vicodin [Hydrocodone-Acetaminophen] Hives  ?  ? ?Prior to Admission medications   ?Medication Sig Start Date End Date Taking? Authorizing Provider  ?acetaminophen (TYLENOL) 500 MG tablet Take 1,000 mg by mouth every 6 (six) hours as needed for headache.    [provider]  ?albuterol (PROVENTIL HFA;VENTOLIN HFA) 108 (90 Base) MCG/ACT inhaler Inhale 2 puffs into the lungs every 6 (six) hours as needed for wheezing or shortness of breath. 08/20/17   WLaban Emperor PA-C  ?Blood Pressure Monitoring (BLOOD PRESSURE CUFF) MISC 1 Device by Does not apply route once a week. 12/25/18   DSloan Leiter MD  ?Blood Pressure Monitoring (BLOOD PRESSURE KIT) DEVI 1 Device by Does not apply route once a week. 12/13/20   AWoodroe Mode MD  ?cyclobenzaprine (FLEXERIL) 10 MG tablet Take 1 tablet (10 mg total) by mouth every 8 (eight) hours as needed for muscle spasms. 08/18/19   HShelly Bombard MD  ?famotidine (PEPCID) 20 MG tablet Take 1 tablet (20 mg total) by mouth 2 (two) times daily. 01/29/21   NWende Mott CNM  ?ferrous sulfate 325 (65 FE) MG tablet Take 1 tablet (325 mg total) by mouth 2 (two) times daily with a meal. 04/16/19   HShelly Bombard MD  ?lamoTRIgine (LAMICTAL) 200 MG tablet Take 0.5 tablets (100 mg total) by mouth 2 (two) times daily. 03/15/21   Sater, RNanine Means MD  ?levETIRAcetam (KEPPRA) 750 MG tablet Take 2 tablets (1,500 mg total) by mouth 2 (two) times daily. 06/22/20 06/17/21  Sater, RNanine Means MD  ?metroNIDAZOLE (METROGEL) 0.75 % vaginal gel Place 1 Applicatorful vaginally at bedtime.  Apply one applicatorful to vagina at bedtime for 5 days 03/17/21   Griffin Basil, MD  ?ondansetron (ZOFRAN ODT) 4 MG disintegrating tablet Take 1 tablet (4 mg total) by mouth every 8 (eight) hours as needed for nausea or vomiting. 12/13/20   Woodroe Mode, MD  ?pantoprazole (PROTONIX) 40 MG tablet Take 1 tablet (40 mg total) by mouth daily. 03/08/21  03/08/22  Woodroe Mode, MD  ?polyethylene glycol (MIRALAX / GLYCOLAX) 17 g packet Take 17 g by mouth 2 (two) times daily. 11/11/18   FairMarin Shutter, MD  ?Prenatal Vit-Fe Fumarate-FA (PREPLUS) 27-1 MG TABS Take 1 tablet by mouth daily. 12/13/20   Woodroe Mode, MD  ?Prenatal Vit-Fe Fumarate-FA (PREPLUS) 27-1 MG TABS Take 1 tablet by mouth daily. 03/17/21   Griffin Basil, MD  ?Prenatal-DSS-FeCb-FeGl-FA [place] Scarlet BLOOM) 90-1 MG TABS Take 1 tablet by mouth daily. 03/17/21   Griffin Basil, MD  ? ? ?Social History: She  reports that she has never smoked. She has never used smokeless tobacco. She reports that she does not currently use alcohol. She reports that she does not use drugs. ? ?Family History: family history includes Breast cancer in her maternal grandmother; Cancer in her mother; Clotting disorder in her maternal grandmother; Colon cancer in her maternal grandmother; Diabetes in her maternal grandmother and paternal grandmother; Hypertension in her father and maternal grandmother; Seizures in her maternal aunt and mother; Sickle cell anemia in her mother; Stroke in her maternal grandmother and mother. ,no history of gyn cancers ? ?Review of Systems: A full review of systems was performed and negative except as noted in the HPI.   ? ?O: ? BP (!) 113/97 (BP Location: Right Arm)   Pulse 67   Temp 99.1 ?F (37.3 ?C) (Oral)   Resp 16   Ht '5\' 4"'  (1.626 m)   Wt (!) 147 kg Comment: 324lbs  LMP 10/02/2020 (Approximate)   BMI 55.61 kg/m?  ?Results for orders placed or performed during the hospital encounter of 03/19/21 (from the past 48 hour(s))  ?Wet prep, genital  ? Collection Time: 03/19/21 11:33 AM  ? Specimen: Vaginal  ?Result Value Ref Range  ? Yeast Wet Prep HPF POC NONE SEEN NONE SEEN  ? Trich, Wet Prep NONE SEEN NONE SEEN  ? Clue Cells Wet Prep HPF POC PRESENT (A) NONE SEEN  ? WBC, Wet Prep HPF POC <10 <10  ? Sperm NONE SEEN   ?Chlamydia/NGC rt PCR ([place] only)  ? Collection Time: 03/19/21 11:33 AM  ?  Specimen: Vaginal  ?Result Value Ref Range  ? Specimen source GC/Chlam ENDOCERVICAL   ? Chlamydia Tr NOT DETECTED NOT DETECTED  ? N gonorrhoeae NOT DETECTED NOT DETECTED  ?  ? ?Constitutional: NAD, AAOx3  ?HE/ENT: extraocular movements grossly intact, moist mucous membranes ?CV: RRR ?PULM: nl respiratory effort ?Abd: gravid, non-tender, non-distended, soft  ?Ext: Non-tender, Nonedmeatous ?Psych: mood appropriate, speech normal ?Pelvic :  Speculum exam neg for vaginal bleeding, pooling,+ vaginal discharge .  ?SVE: Exam by:: Theola Sequin CNM deferred ? ?CLINICAL DATA:  Decreased fetal movement, vaginal bleeding ?  ?EXAM: ?LIMITED OBSTETRIC ULTRASOUND ?  ?COMPARISON:  03/16/2021 ?  ?FINDINGS: ?Number of Fetuses: 1 ?  ?Heart Rate:  143 bpm ?  ?Movement: Yes ?  ?Presentation: Breech ?  ?Placental Location: Posterior ?  ?Previa: No ?  ?Amniotic Fluid (Subjective):  Within normal limits. ?  ?BPD: 5.8 cm 23 w  5 d ?  ?MATERNAL FINDINGS: ?  ?Cervix:  Appears closed. ?  ?Uterus/Adnexae: No abnormality visualized. ?  ?IMPRESSION: ?Single live intrauterine gestation in breech presentation ? ? ?Assessment: 26 y.o. 30w0dhere for antenatal surveillance during pregnancy. ? ?Principle diagnosis: Decreased fetal movement [O36.8190]  ? ?Plan: ?Labor: not present.  ?wet prep/GC/chlamydia labs ?GC/Chlamydia- neg ?Prescription sent pharmacy Flagyl for BV ?OB limited [place]- reassuring  ?D/c home stable, precautions reviewed, follow-up as scheduled.  ? ?----- ?FAvelino Leeds CNM ?Certified Nurse Midwife ?[facility] ?  ?

## 2021-03-19 NOTE — Discharge Instructions (Signed)
Discharge instructions gone over with patient.  Printed copies given to patient for discharge.  Please keep your next follow up appointment.  If you have questions or concerns you may call your on call provider.   ?

## 2021-03-22 ENCOUNTER — Other Ambulatory Visit: Payer: Self-pay

## 2021-03-22 ENCOUNTER — Ambulatory Visit (INDEPENDENT_AMBULATORY_CARE_PROVIDER_SITE_OTHER): Payer: Managed Care, Other (non HMO) | Admitting: Licensed Clinical Social Worker

## 2021-03-22 DIAGNOSIS — Z8659 Personal history of other mental and behavioral disorders: Secondary | ICD-10-CM | POA: Diagnosis not present

## 2021-03-22 DIAGNOSIS — O219 Vomiting of pregnancy, unspecified: Secondary | ICD-10-CM

## 2021-03-22 DIAGNOSIS — O99891 Other specified diseases and conditions complicating pregnancy: Secondary | ICD-10-CM | POA: Diagnosis not present

## 2021-03-22 MED ORDER — ONDANSETRON 4 MG PO TBDP: 4.0000 mg | ORAL_TABLET | Freq: Three times a day (TID) | ORAL | 0 refills | Status: DC | PRN

## 2021-03-23 ENCOUNTER — Observation Stay
Admission: EM | Admit: 2021-03-23 | Discharge: 2021-03-23 | Disposition: A | Discharge status: Home / Self Care | Payer: Medicaid Other | Attending: Obstetrics and Gynecology | Admitting: Obstetrics and Gynecology

## 2021-03-23 ENCOUNTER — Encounter: Payer: Self-pay | Admitting: Obstetrics and Gynecology

## 2021-03-23 ENCOUNTER — Other Ambulatory Visit: Payer: Self-pay

## 2021-03-23 DIAGNOSIS — O99512 Diseases of the respiratory system complicating pregnancy, second trimester: Secondary | ICD-10-CM | POA: Insufficient documentation

## 2021-03-23 DIAGNOSIS — O99212 Obesity complicating pregnancy, second trimester: Secondary | ICD-10-CM | POA: Insufficient documentation

## 2021-03-23 DIAGNOSIS — Z91040 Latex allergy status: Secondary | ICD-10-CM | POA: Diagnosis not present

## 2021-03-23 DIAGNOSIS — O99891 Other specified diseases and conditions complicating pregnancy: Secondary | ICD-10-CM | POA: Diagnosis not present

## 2021-03-23 DIAGNOSIS — J45909 Unspecified asthma, uncomplicated: Secondary | ICD-10-CM | POA: Diagnosis not present

## 2021-03-23 DIAGNOSIS — R102 Pelvic and perineal pain: Secondary | ICD-10-CM | POA: Diagnosis not present

## 2021-03-23 DIAGNOSIS — Z3A24 24 weeks gestation of pregnancy: Secondary | ICD-10-CM | POA: Insufficient documentation

## 2021-03-23 DIAGNOSIS — O26899 Other specified pregnancy related conditions, unspecified trimester: Secondary | ICD-10-CM | POA: Diagnosis present

## 2021-03-23 DIAGNOSIS — Z6841 Body Mass Index (BMI) 40.0 and over, adult: Secondary | ICD-10-CM | POA: Insufficient documentation

## 2021-03-23 DIAGNOSIS — Z79899 Other long term (current) drug therapy: Secondary | ICD-10-CM | POA: Insufficient documentation

## 2021-03-23 DIAGNOSIS — O365920 Maternal care for other known or suspected poor fetal growth, second trimester, not applicable or unspecified: Secondary | ICD-10-CM | POA: Insufficient documentation

## 2021-03-23 DIAGNOSIS — O0990 Supervision of high risk pregnancy, unspecified, unspecified trimester: Secondary | ICD-10-CM

## 2021-03-23 LAB — URINALYSIS, ROUTINE W REFLEX MICROSCOPIC
Bilirubin Urine: NEGATIVE
Glucose, UA: NEGATIVE mg/dL
Hgb urine dipstick: NEGATIVE
Ketones, ur: 5 mg/dL — AB
Leukocytes,Ua: NEGATIVE
Nitrite: NEGATIVE
Protein, ur: 30 mg/dL — AB
Specific Gravity, Urine: 1.032 — ABNORMAL HIGH (ref 1.005–1.030)
pH: 6 (ref 5.0–8.0)

## 2021-03-23 MED ORDER — PRENATAL MULTIVITAMIN CH: 1.0000 | ORAL_TABLET | Freq: Every day | ORAL | Status: DC

## 2021-03-23 MED ORDER — CALCIUM CARBONATE ANTACID 500 MG PO CHEW
2.0000 | CHEWABLE_TABLET | ORAL | Status: DC | PRN
  Administered 2021-03-23: 400 mg via ORAL
  Filled 2021-03-23: qty 2

## 2021-03-23 MED ORDER — ACETAMINOPHEN 325 MG PO TABS
650.0000 mg | ORAL_TABLET | ORAL | Status: DC | PRN
  Administered 2021-03-23: 650 mg via ORAL
  Filled 2021-03-23: qty 2

## 2021-03-23 NOTE — Discharge Summary (Signed)
Latoya Stark is a 26 y.o. female. She is at 53w4dgestation. Patient's last menstrual period was 10/02/2020 (approximate). ?Estimated Date of Delivery: 07/09/21 ? ?[facility] [place] ?Current pregnancy complicated by:  ?Morbid obesity during pregnancy BMI 56.26 ?Seizure disorder, last seizure 2 weeks ago ?IUGR 9th %tile, normal UA dopplers without evidence of AEDF or REDF-03/16/21, repeat growth in 3 weeks ? ?Chief complaint: pelvic pressure ? ?She reports pelvic pressure on either side of her uterus that is increasing tonight. She states the pressure comes and goes throughout the evening, worse when she moves. She denies contractions. She states the pressure feels like she needs to have a BM but she knows she does not need to have a BM. She was seen and treated for BV 03/19/21 and just started taking her Flagyl.  ? ?S: Resting comfortably. no CTX, no VB.no LOF,  Active fetal movement. Denies: HA, visual changes, SOB, or RUQ/epigastric pain ? ?Maternal Medical History:  ? ?Past Medical History:  ?Diagnosis Date  ? ADHD (attention deficit hyperactivity disorder)   ? Anemia   ? Anxiety   ? Asthma   ? Chronic migraine 08/01/2018  ? Depression   ? Gonorrhea in female 10/05/2013  ? 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 Headache   ? Obesity   ? Seizures ([place]   ? "when I get too hot"  ? Trichomonas infection   ? UTI (lower urinary tract infection)   ? ? ?Past Surgical History:  ?Procedure Laterality Date  ? ADENOIDECTOMY    ? DILATION AND EVACUATION N/A 08/09/2013  ? Procedure: DILATATION AND EVACUATION;  Surgeon: BFrederico Hamman MD;  [facility];  Service: Gynecology;  Laterality: N/A;  ? ORTHOPEDIC SURGERY Left   ? left hand  ? TONSILLECTOMY    ? ? ?Allergies  ?Allergen Reactions  ? Milk-Related Compounds Dermatitis  ?  Breaks face out ?*can have 2% milk*  ? Mushroom Extract Complex Anaphylaxis  ? Penicillins Anaphylaxis  ?  Has patient had a PCN  reaction causing immediate rash, facial/tongue/throat swelling, SOB or lightheadedness with hypotension: Yes ?Has patient had a PCN reaction causing severe rash involving mucus membranes or skin necrosis: No ?Has patient had a PCN reaction that required hospitalization No ?Has patient had a PCN reaction occurring within the last 10 years: Yes ?If all of the above answers are "NO", then may proceed with Cephalosporin use. ?  ? Cheese Other (See Comments)  ?  Breaks face out  ? Latex Itching and Swelling  ? Sulfa Antibiotics Swelling  ? Vicodin [Hydrocodone-Acetaminophen] Hives  ? ? ?Prior to Admission medications   ?Medication Sig Start Date End Date Taking? Authorizing Provider  ?acetaminophen (TYLENOL) 500 MG tablet Take 1,000 mg by mouth every 6 (six) hours as needed for headache.   Yes [provider]  ?albuterol (PROVENTIL HFA;VENTOLIN HFA) 108 (90 Base) MCG/ACT inhaler Inhale 2 puffs into the lungs every 6 (six) hours as needed for wheezing or shortness of breath. 08/20/17  Yes WLaban Emperor PA-C  ?Blood Pressure Monitoring (BLOOD PRESSURE CUFF) MISC 1 Device by Does not apply route once a week. 12/25/18  Yes DSloan Leiter MD  ?Blood Pressure Monitoring (BLOOD PRESSURE KIT) DEVI 1 Device by Does not apply route once a week. 12/13/20  Yes AWoodroe Mode MD  ?ferrous sulfate 325 (65 FE) MG tablet Take 1 tablet (325 mg total) by mouth 2 (two) times daily with  a meal. 04/16/19  Yes Shelly Bombard, MD  ?lamoTRIgine (LAMICTAL) 200 MG tablet Take 0.5 tablets (100 mg total) by mouth 2 (two) times daily. 03/15/21  Yes Sater, Nanine Means, MD  ?levETIRAcetam (KEPPRA) 750 MG tablet Take 2 tablets (1,500 mg total) by mouth 2 (two) times daily. 06/22/20 06/17/21 Yes Sater, Nanine Means, MD  ?metroNIDAZOLE (FLAGYL) 500 MG tablet Take 1 tablet (500 mg total) by mouth 2 (two) times daily for 7 days. 03/19/21 03/26/21 Yes Ed Blalock, CNM  ?ondansetron (ZOFRAN ODT) 4 MG disintegrating tablet Take 1 tablet (4  mg total) by mouth every 8 (eight) hours as needed for nausea or vomiting. 03/22/21  Yes Woodroe Mode, MD  ?pantoprazole (PROTONIX) 40 MG tablet Take 1 tablet (40 mg total) by mouth daily. 03/08/21 03/08/22 Yes Woodroe Mode, MD  ?Prenatal Vit-Fe Fumarate-FA (PREPLUS) 27-1 MG TABS Take 1 tablet by mouth daily. 03/17/21  Yes Griffin Basil, MD  ?famotidine (PEPCID) 20 MG tablet Take 1 tablet (20 mg total) by mouth 2 (two) times daily. 01/29/21   Wende Mott, CNM  ?polyethylene glycol (MIRALAX / GLYCOLAX) 17 g packet Take 17 g by mouth 2 (two) times daily. ?Patient not taking: Reported on 03/19/2021 11/11/18   Chauncey Mann, MD  ?Prenatal Vit-Fe Fumarate-FA (PREPLUS) 27-1 MG TABS Take 1 tablet by mouth daily. ?Patient not taking: Reported on 03/19/2021 12/13/20   Woodroe Mode, MD  ? ? ? ? ?Social History: She  reports that she has never smoked. She has never used smokeless tobacco. She reports that she does not currently use alcohol. She reports that she does not use drugs. ? ?Family History: family history includes Breast cancer in her maternal grandmother; Cancer in her mother; Clotting disorder in her maternal grandmother; Colon cancer in her maternal grandmother; Diabetes in her maternal grandmother and paternal grandmother; Hypertension in her father and maternal grandmother; Seizures in her maternal aunt and mother; Sickle cell anemia in her mother; Stroke in her maternal grandmother and mother.  no history of gyn cancers ? ?Review of Systems: A full review of systems was performed and negative except as noted in the HPI.   ? ? ?O: ? BP (!) 100/59 (BP Location: Right Arm)   Pulse 98   Temp 98.3 ?F (36.8 ?C) (Oral)   Resp 16   LMP 10/02/2020 (Approximate)  ?Results for orders placed or performed during the hospital encounter of 03/23/21 (from the past 48 hour(s))  ?Urinalysis, Routine w reflex microscopic Urine, Clean Catch  ? Collection Time: 03/23/21  1:30 AM  ?Result Value Ref Range  ? Color, Urine  AMBER (A) YELLOW  ? APPearance HAZY (A) CLEAR  ? Specific Gravity, Urine 1.032 (H) 1.005 - 1.030  ? pH 6.0 5.0 - 8.0  ? Glucose, UA NEGATIVE NEGATIVE mg/dL  ? Hgb urine dipstick NEGATIVE NEGATIVE  ? Bilirubin Urine NEGATIVE NEGATIVE  ? Ketones, ur 5 (A) NEGATIVE mg/dL  ? Protein, ur 30 (A) NEGATIVE mg/dL  ? Nitrite NEGATIVE NEGATIVE  ? Leukocytes,Ua NEGATIVE NEGATIVE  ? RBC / HPF 0-5 0 - 5 RBC/hpf  ? WBC, UA 0-5 0 - 5 WBC/hpf  ? Bacteria, UA FEW (A) NONE SEEN  ? Squamous Epithelial / LPF 0-5 0 - 5  ? Mucus PRESENT   ?  ? ?Constitutional: NAD, AAOx3  ?HE/ENT: extraocular movements grossly intact, moist mucous membranes ?CV: RRR ?PULM: nl respiratory effort, CTABL     ?Abd: gravid, non-tender, non-distended, soft ,  no contractions palpated     ?Ext: Non-tender, Nonedematous   ?Psych: mood appropriate, speech normal ?Pelvic: SSE done ? Pelvic exam: normal external genitalia, vulva, vagina, cervix, uterus and adnexa. Cervix long and closed. Thin gray vaginal discharge present. No blood noted. ? ?Fetal  monitoring: Cat 1 Appropriate for GA ?Baseline: 145bpm ?Variability: moderate ?Accelerations: absent/ present x >2 10x10bpm ?Decelerations absent ? ?A/P: 26 y.o. 78w4dhere for antenatal surveillance for pelvic pressure ? ?Principle Diagnosis:  High risk pregnancy in second trimester ? ?Labor: not present. ?UA: appears dehydrated, increase oral hydration. ?Continue taking Flagyl as prescribed for BV ?Fetal Wellbeing: Reassuring Cat 1 tracing. ?Reactive NST  ?Follow up with primary OB/GYN for pelvic pressure and pelvic pain ?Stretching, seeing a chiropractor, massages, and pregnancy support belt will help with round ligament pain. ?D/c home stable, precautions reviewed, follow-up as scheduled.  ? ? ?DGertie Fey CNM ?03/23/2021 ?3:03 AM ? ?

## 2021-03-23 NOTE — BH Specialist Note (Signed)
Integrated Behavioral Health via Telemedicine Visit ? ?03/23/2021 ?Latoya Stark ?[id number] ? ?Number of [place] Clinician visits: 3 ?Session Start time:  11:03am ?Session End time: 11:27am ?Total time in minutes: 24 mins via mychart video  ? ?Referring Provider: Dr. Roselie Awkward  ?Patient/Family location: Home  ?[facility] Provider location: [facility]  ?All persons participating in visit: [name] and LCSW A.[name]  ?Types of Service: Individual psychotherapy and Video visit ? ?I connected with [name] and/or [name] via  Telephone or Video Enabled Telemedicine Application  (Video is Caregility application) and verified that I am speaking with the correct person using two identifiers. Discussed confidentiality: Yes  ? ?I discussed the limitations of telemedicine and the availability of in person appointments.  Discussed there is a possibility of technology failure and discussed alternative modes of communication if that failure occurs. ? ?I discussed that engaging in this telemedicine visit, they consent to the provision of behavioral healthcare and the services will be billed under their insurance. ? ?Patient and/or legal guardian expressed understanding and consented to Telemedicine visit: No  ? ?Presenting Concerns: ?Patient and/or family reports the following symptoms/concerns: history of postpartum depression/depression affecting pregnancy ?Duration of problem: over one year ; Severity of problem: mild ? ?Patient and/or Family's Strengths/Protective Factors: ?Concrete supports in place (healthy food, safe environments, etc.) ? ?Goals Addressed: ?Patient will: ? Reduce symptoms of: depression  ? Increase knowledge and/or ability of: coping skills  ? Demonstrate ability to: Increase healthy adjustment to current life circumstances ? ?Progress towards Goals: ?Ongoing ? ?Interventions: ?Interventions utilized:  Supportive Counseling ?Standardized Assessments completed: PHQ  9 ?[place] from 03/22/2021 in [place]  ?PHQ-9 Total Score 10  ? ?  ?  ? ?Patient and/or Family Response: [name] responded well to mychart visit   ? ?Assessment: ?Patient has a history of postpartum depression.  ? ?Patient may benefit from integrated behavioral health. ? ?Plan: ?Follow up with behavioral health clinician on : 04/05/2021 ?Behavioral recommendations: Continue utilizing relaxation techniques, prioritize sleep, begin journal writing to identify emotional triggers.  ?Referral(s): [facility] (In Clinic) ? ?I discussed the assessment and treatment plan with the patient and/or parent/guardian. They were provided an opportunity to ask questions and all were answered. They agreed with the plan and demonstrated an understanding of the instructions. ?  ?They were advised to call back or seek an in-person evaluation if the symptoms worsen or if the condition fails to improve as anticipated. ? ?Lynnea Ferrier, LCSW ?

## 2021-03-23 NOTE — Progress Notes (Signed)
Discharge instructions provided to patient. Patient verbalized understanding. Pt educated on signs and symptoms of labor, vaginal bleeding, LOF, and fetal movement. Red flag signs reviewed by RN. Patient discharged home with significant other in stable condition.  

## 2021-03-23 NOTE — OB Triage Note (Signed)
Patient is a  26 yo, G8P1, at 24 weeks 4 days. Patient presents with complaints of constant lower abdominal pressure rated at 10/10. Patient reports normal fetal movement and denies any vaginal bleeding or LOF. Patient states she is currently being treated for BV. Monitors applied and assessing. VSS. Initial fetal heart tone 150. Will continue to monitor.   ?

## 2021-03-29 ENCOUNTER — Ambulatory Visit: Payer: Medicaid Other | Admitting: Neurology

## 2021-03-30 ENCOUNTER — Ambulatory Visit: Payer: Medicaid Other | Admitting: *Deleted

## 2021-03-30 ENCOUNTER — Encounter: Payer: Self-pay | Admitting: *Deleted

## 2021-03-30 ENCOUNTER — Ambulatory Visit (HOSPITAL_BASED_OUTPATIENT_CLINIC_OR_DEPARTMENT_OTHER): Payer: Medicaid Other | Admitting: *Deleted

## 2021-03-30 ENCOUNTER — Other Ambulatory Visit: Payer: Self-pay

## 2021-03-30 ENCOUNTER — Ambulatory Visit: Payer: Medicaid Other | Attending: Maternal & Fetal Medicine

## 2021-03-30 VITALS — BP 105/57 | HR 97

## 2021-03-30 DIAGNOSIS — E669 Obesity, unspecified: Secondary | ICD-10-CM

## 2021-03-30 DIAGNOSIS — Z3A25 25 weeks gestation of pregnancy: Secondary | ICD-10-CM

## 2021-03-30 DIAGNOSIS — G40909 Epilepsy, unspecified, not intractable, without status epilepticus: Secondary | ICD-10-CM | POA: Diagnosis not present

## 2021-03-30 DIAGNOSIS — O99212 Obesity complicating pregnancy, second trimester: Secondary | ICD-10-CM

## 2021-03-30 DIAGNOSIS — O0990 Supervision of high risk pregnancy, unspecified, unspecified trimester: Secondary | ICD-10-CM

## 2021-03-30 DIAGNOSIS — O365921 Maternal care for other known or suspected poor fetal growth, second trimester, fetus 1: Secondary | ICD-10-CM | POA: Insufficient documentation

## 2021-03-30 DIAGNOSIS — O99350 Diseases of the nervous system complicating pregnancy, unspecified trimester: Secondary | ICD-10-CM | POA: Diagnosis not present

## 2021-03-30 DIAGNOSIS — O26842 Uterine size-date discrepancy, second trimester: Secondary | ICD-10-CM | POA: Insufficient documentation

## 2021-03-30 NOTE — Procedures (Signed)
Latoya Stark ?05/22/1995 ?[redacted]w[redacted]d? ?Fetus A Non-Stress Test Interpretation for 03/30/21 ? ?Indication: IUGR ? ?Fetal Heart Rate A ?Mode: External ?Baseline Rate (A): 145 bpm ?Variability: Minimal, Moderate ?Accelerations: 10 x 10 ?Decelerations: Variable ?Multiple birth?: No ? ?Uterine Activity ?Mode: Toco ?Contraction Frequency (min): none ?Resting Tone Palpated: Relaxed ? ?Interpretation (Fetal Testing) ?Nonstress Test Interpretation: Non-reactive ?Overall Impression: Reassuring for gestational age ?Comments: tracing reviewed by Dr. SDonalee Citrin? ? ?

## 2021-03-31 ENCOUNTER — Telehealth: Payer: Self-pay

## 2021-03-31 NOTE — Telephone Encounter (Signed)
S/w pt and advised to have her HR dept fax the information that they need [place] to submit, pt agreed ?

## 2021-04-05 ENCOUNTER — Other Ambulatory Visit: Payer: Self-pay

## 2021-04-05 ENCOUNTER — Ambulatory Visit (INDEPENDENT_AMBULATORY_CARE_PROVIDER_SITE_OTHER): Payer: Managed Care, Other (non HMO) | Admitting: Obstetrics & Gynecology

## 2021-04-05 ENCOUNTER — Ambulatory Visit: Payer: Medicaid Other | Admitting: *Deleted

## 2021-04-05 ENCOUNTER — Other Ambulatory Visit: Payer: Self-pay | Admitting: *Deleted

## 2021-04-05 ENCOUNTER — Encounter: Payer: Self-pay | Admitting: *Deleted

## 2021-04-05 ENCOUNTER — Ambulatory Visit (INDEPENDENT_AMBULATORY_CARE_PROVIDER_SITE_OTHER): Payer: Managed Care, Other (non HMO) | Admitting: Licensed Clinical Social Worker

## 2021-04-05 ENCOUNTER — Ambulatory Visit: Payer: Medicaid Other | Attending: Maternal & Fetal Medicine

## 2021-04-05 ENCOUNTER — Other Ambulatory Visit: Payer: Managed Care, Other (non HMO)

## 2021-04-05 ENCOUNTER — Ambulatory Visit: Payer: Managed Care, Other (non HMO) | Admitting: Licensed Clinical Social Worker

## 2021-04-05 ENCOUNTER — Ambulatory Visit (HOSPITAL_BASED_OUTPATIENT_CLINIC_OR_DEPARTMENT_OTHER): Payer: Medicaid Other | Admitting: *Deleted

## 2021-04-05 VITALS — BP 120/76 | HR 112

## 2021-04-05 VITALS — BP 107/59 | HR 101 | Wt 330.0 lb

## 2021-04-05 DIAGNOSIS — Z23 Encounter for immunization: Secondary | ICD-10-CM | POA: Diagnosis not present

## 2021-04-05 DIAGNOSIS — O0990 Supervision of high risk pregnancy, unspecified, unspecified trimester: Secondary | ICD-10-CM

## 2021-04-05 DIAGNOSIS — Z362 Encounter for other antenatal screening follow-up: Secondary | ICD-10-CM

## 2021-04-05 DIAGNOSIS — O99212 Obesity complicating pregnancy, second trimester: Secondary | ICD-10-CM

## 2021-04-05 DIAGNOSIS — Z6841 Body Mass Index (BMI) 40.0 and over, adult: Secondary | ICD-10-CM

## 2021-04-05 DIAGNOSIS — O365920 Maternal care for other known or suspected poor fetal growth, second trimester, not applicable or unspecified: Secondary | ICD-10-CM | POA: Diagnosis present

## 2021-04-05 DIAGNOSIS — Z3A26 26 weeks gestation of pregnancy: Secondary | ICD-10-CM | POA: Insufficient documentation

## 2021-04-05 DIAGNOSIS — G40909 Epilepsy, unspecified, not intractable, without status epilepticus: Secondary | ICD-10-CM

## 2021-04-05 DIAGNOSIS — O99891 Other specified diseases and conditions complicating pregnancy: Secondary | ICD-10-CM | POA: Diagnosis not present

## 2021-04-05 DIAGNOSIS — O99353 Diseases of the nervous system complicating pregnancy, third trimester: Secondary | ICD-10-CM

## 2021-04-05 DIAGNOSIS — O99350 Diseases of the nervous system complicating pregnancy, unspecified trimester: Secondary | ICD-10-CM | POA: Diagnosis not present

## 2021-04-05 DIAGNOSIS — E669 Obesity, unspecified: Secondary | ICD-10-CM

## 2021-04-05 DIAGNOSIS — Z8659 Personal history of other mental and behavioral disorders: Secondary | ICD-10-CM | POA: Diagnosis not present

## 2021-04-05 DIAGNOSIS — O365921 Maternal care for other known or suspected poor fetal growth, second trimester, fetus 1: Secondary | ICD-10-CM | POA: Insufficient documentation

## 2021-04-05 DIAGNOSIS — O26842 Uterine size-date discrepancy, second trimester: Secondary | ICD-10-CM | POA: Diagnosis present

## 2021-04-05 NOTE — Progress Notes (Signed)
TDAP and FLU vaccines given in LD, tolerated well. ?

## 2021-04-05 NOTE — Progress Notes (Signed)
? ?  PRENATAL VISIT NOTE ? ?Subjective:  ?Latoya Stark is a 26 y.o. [phone] at 35w3dbeing seen today for ongoing prenatal care.  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 ? ?Patient reports no complaints.  Contractions: Not present. Vag. Bleeding: None.  Movement: Present. Denies leaking of fluid.  ? ?The following portions of the patient's history were reviewed and updated as appropriate: allergies, current medications, past family history, past medical history, past social history, past surgical history and problem list.  ? ?Objective:  ? ?Vitals:  ? 04/05/21 0901  ?BP: (!) 107/59  ?Pulse: (!) 101  ?Weight: (!) 330 lb (149.7 kg)  ? ? ?Fetal Status: Fetal Heart Rate (bpm): 154   Movement: Present    ? ?General:  Alert, oriented and cooperative. Patient is in no acute distress.  ?Skin: Skin is warm and dry. No rash noted.   ?Cardiovascular: Normal heart rate noted  ?Respiratory: Normal respiratory effort, no problems with respiration noted  ?Abdomen: Soft, gravid, appropriate for gestational age.  Pain/Pressure: Present     ?Pelvic: Cervical exam deferred        ?Extremities: Normal range of motion.  Edema: Trace  ?Mental Status: Normal mood and affect. Normal behavior. Normal judgment and thought content.  ? ?Assessment and Plan:  ?Pregnancy: [id number] 245w3d1. Supervision of high risk pregnancy, antepartum ?Rotine labs ?- Glucose Tolerance, 2 Hours w/1 Hour ?-  RPR ?- CBC ?- HIV antibody (with reflex) ? ?2. BMI 50.0-59.9, adult ([place]?F/u USKoreadiscussed dating criteria extensively ? ?3. Seizure disorder during pregnancy in third trimester ([facility]?Continue management ? ?Preterm labor symptoms and general obstetric precautions including but not limited to vaginal bleeding, contractions, leaking of fluid and fetal movement were reviewed in detail with the patient. ?Please refer to After Visit Summary for other counseling recommendations.  ? ?Return in about 4 weeks (around 05/03/2021). ? ?Future Appointments  ?Date Time Provider [place]?04/05/2021 12:30 PM [facility] NURSE [facility]  ?04/05/2021 12:45 PM [facility] US5 [facility]US [facility]  ?04/05/2021  2:15 PM [facility] NST [facility]  ?04/06/2021  2:00 PM VaBary RichardPT [facility]-NR [facility]NR  ?04/19/2021 11:15 AM WeLuvenia ReddenPA-C [facility] None  ?05/03/2021 11:15 AM Constant, PeVickii ChafeMD [facility] None  ?09/21/2021 11:00 AM Lomax, Amy, NP [facility] None  ? ? ?JaEmeterio ReeveMD ? ?

## 2021-04-05 NOTE — Procedures (Signed)
LAUNA GOEDKEN ?June 22, 1995 ?[redacted]w[redacted]d? ?Fetus A Non-Stress Test Interpretation for 04/05/21 ? ?Indication: IUGR and Morbid obesity ? ?Fetal Heart Rate A ?Mode: External ?Baseline Rate (A): 140 bpm ?Variability: Moderate ?Accelerations: 10 x 10 ?Decelerations: None ?Multiple birth?: No ? ?Uterine Activity ?Mode: Palpation, Toco ?Contraction Frequency (min): None ? ?Interpretation (Fetal Testing) ?Nonstress Test Interpretation: Reactive ?Overall Impression: Reassuring for gestational age ?Comments: Dr. FAnnamaria Bootsreviewed tracing. Cardio held in place. Difficult to obtain tracing due to BMI 56. ? ? ?

## 2021-04-06 ENCOUNTER — Encounter: Payer: Self-pay | Admitting: Emergency Medicine

## 2021-04-06 ENCOUNTER — Ambulatory Visit: Payer: Managed Care, Other (non HMO) | Admitting: Physical Therapy

## 2021-04-06 ENCOUNTER — Telehealth: Payer: Self-pay | Admitting: Emergency Medicine

## 2021-04-06 DIAGNOSIS — O0990 Supervision of high risk pregnancy, unspecified, unspecified trimester: Secondary | ICD-10-CM

## 2021-04-06 LAB — CBC
Hematocrit: 32.2 % — ABNORMAL LOW (ref 34.0–46.6)
Hemoglobin: 10.9 g/dL — ABNORMAL LOW (ref 11.1–15.9)
MCH: 28.5 pg (ref 26.6–33.0)
MCHC: 33.9 g/dL (ref 31.5–35.7)
MCV: 84 fL (ref 79–97)
Platelets: 279 10*3/uL (ref 150–450)
RBC: 3.82 x10E6/uL (ref 3.77–5.28)
RDW: 13.1 % (ref 11.7–15.4)
WBC: 7.4 10*3/uL (ref 3.4–10.8)

## 2021-04-06 LAB — GLUCOSE TOLERANCE, 2 HOURS W/ 1HR
Glucose, 1 hour: 157 mg/dL (ref 70–179)
Glucose, 2 hour: 117 mg/dL (ref 70–152)
Glucose, Fasting: 102 mg/dL — ABNORMAL HIGH (ref 70–91)

## 2021-04-06 LAB — HIV ANTIBODY (ROUTINE TESTING W REFLEX): HIV Screen 4th Generation wRfx: NONREACTIVE

## 2021-04-06 LAB — RPR: RPR Ser Ql: NONREACTIVE

## 2021-04-06 MED ORDER — ACCU-CHEK SOFTCLIX LANCETS MISC: 1.0000 | Freq: Four times a day (QID) | 12 refills | Status: DC

## 2021-04-06 MED ORDER — ACCU-CHEK GUIDE VI STRP: ORAL_STRIP | 12 refills | Status: DC

## 2021-04-06 MED ORDER — ACCU-CHEK GUIDE W/DEVICE KIT: 1.0000 | PACK | Freq: Four times a day (QID) | 0 refills | Status: DC

## 2021-04-06 NOTE — Telephone Encounter (Signed)
RC call from patient to discuss failed Gtt. Pt informed that referral is put in for Diabetes/Nutrition counseling and that she should anticipate a phone call from them. ? ?Pt also made aware that glucometer and diabetes supplies sent to pharmacy. Pt should take supplies to counseling. ? ?Pt verbalizes understanding. ?

## 2021-04-06 NOTE — BH Specialist Note (Signed)
Integrated Behavioral Health Follow Up In-Person Visit ? ?MRN: [id number] ?Name: Latoya Stark ? ?Number of [place] Clinician visits: 4 ?Session Start time:  9:31am ?Session End time: 9:57am ?Total time in minutes: 26 mins in person at [facility] ? ?Types of Service: Individual psychotherapy ? ?Interpretor:No. Interpretor Name and Language: none ? ?Subjective: ?Latoya Stark is a 26 y.o. female accompanied by n/a ?Patient was referred by Dr. Roselie Awkward for history of postpartum depression. ?Patient reports the following symptoms/concerns: depressed mood, difficulty sleeping, mood changes.  ?Duration of problem: over one year; Severity of problem: mild ? ?Objective: ?Mood: Irritable and Affect: Appropriate ?Risk of harm to self or others: No plan to harm self or others ? ?Life Context: ?Family and Social: lives with partner in [place], [place]  ?School/Work: n/a ?Self-Care: n/a ?Life Changes: new pregnancy ? ?Patient and/or Family's Strengths/Protective Factors: ?Concrete supports in place (healthy food, safe environments, etc.) ? ?Goals Addressed: ?Patient will: ? Reduce symptoms of: depression and mood instability  ? Increase knowledge and/or ability of: coping skills and stress reduction  ? Demonstrate ability to: Increase adequate support systems for patient/family ? ?Progress towards Goals: ?Ongoing ? ?Interventions: ?Interventions utilized:  Optician, dispensing and Supportive Counseling ?Standardized Assessments completed: PHQ 9 ? ?Patient and/or Family Response: [name] responded well to visit.  ? ? ?Assessment: ?Patient has history of postpartum depression. [name] reports changes in mood and nervous due to numerous appts. [name] reports FOB is supportive. [name] desire helps with managing her depressive symptoms   ? ?Patient may benefit from integrated behavioral health. ? ?Plan: ?Follow up with behavioral health clinician on : 05/03/2021 ?Behavioral recommendations:  Relaxation and mindfulness tech to reduce stress, prioritize stress, write down questions for medical appts for clarity, delegate task to prevent burnout ?Referral(s): [facility] (In Clinic) ?"From scale of 1-10, how likely are you to follow plan?":   ? ?Lynnea Ferrier, LCSW ? ? ?

## 2021-04-13 ENCOUNTER — Encounter (HOSPITAL_COMMUNITY): Payer: Self-pay | Admitting: Family Medicine

## 2021-04-13 ENCOUNTER — Inpatient Hospital Stay (HOSPITAL_COMMUNITY)
Admission: AD | Admit: 2021-04-13 | Discharge: 2021-04-13 | Disposition: A | Discharge status: Home / Self Care | Payer: Medicaid Other | Attending: Family Medicine | Admitting: Family Medicine

## 2021-04-13 ENCOUNTER — Ambulatory Visit: Payer: Managed Care, Other (non HMO) | Attending: Neurology | Admitting: Physical Therapy

## 2021-04-13 ENCOUNTER — Other Ambulatory Visit: Payer: Self-pay

## 2021-04-13 DIAGNOSIS — O26899 Other specified pregnancy related conditions, unspecified trimester: Secondary | ICD-10-CM | POA: Diagnosis not present

## 2021-04-13 DIAGNOSIS — O368120 Decreased fetal movements, second trimester, not applicable or unspecified: Secondary | ICD-10-CM | POA: Insufficient documentation

## 2021-04-13 DIAGNOSIS — R102 Pelvic and perineal pain: Secondary | ICD-10-CM | POA: Diagnosis not present

## 2021-04-13 DIAGNOSIS — O0990 Supervision of high risk pregnancy, unspecified, unspecified trimester: Secondary | ICD-10-CM

## 2021-04-13 DIAGNOSIS — Z880 Allergy status to penicillin: Secondary | ICD-10-CM | POA: Insufficient documentation

## 2021-04-13 DIAGNOSIS — O26892 Other specified pregnancy related conditions, second trimester: Secondary | ICD-10-CM | POA: Diagnosis not present

## 2021-04-13 DIAGNOSIS — Z3A27 27 weeks gestation of pregnancy: Secondary | ICD-10-CM | POA: Diagnosis not present

## 2021-04-13 LAB — URINALYSIS, ROUTINE W REFLEX MICROSCOPIC
Bilirubin Urine: NEGATIVE
Glucose, UA: NEGATIVE mg/dL
Hgb urine dipstick: NEGATIVE
Ketones, ur: NEGATIVE mg/dL
Leukocytes,Ua: NEGATIVE
Nitrite: NEGATIVE
Protein, ur: NEGATIVE mg/dL
Specific Gravity, Urine: 1.025 (ref 1.005–1.030)
pH: 6 (ref 5.0–8.0)

## 2021-04-13 NOTE — MAU Provider Note (Signed)
?History  ?  ? ?CSN: [id number] ? ?Arrival date and time: 04/13/21 1510 ? ? Event Date/Time  ? First Provider Initiated Contact with Patient 04/13/21 1558   ?  ? ?Chief Complaint  ?Patient presents with  ? Abdominal Pain  ? Decreased Fetal Movement  ? ?Latoya Stark is a 26 y.o. year old [id number] female at 67w4dweeks gestation who presents to MAU reporting DFM x 3 days (last FM felt yesterday), LT sided, sharp RLP the same since 01/2021. She has not purchased a maternity support belt. She has tried to drink cold drink and eating small sweet item. She states she usually will eat something sweet or drink something cold, "he moves off the chain." [facility]; next appt is 04/18/21 ([place] and 04/19/21. ? ? ?OB History   ? ? Gravida  ?8  ? Para  ?1  ? Term  ?1  ? Preterm  ?   ? AB  ?6  ? Living  ?1  ?  ? ? SAB  ?4  ? IAB  ?1  ? Ectopic  ?   ? Multiple  ?   ? Live Births  ?1  ?   ?  ?  ? ? ?Past Medical History:  ?Diagnosis Date  ? ADHD (attention deficit hyperactivity disorder)   ? Anemia   ? Anxiety   ? Asthma   ? Chronic migraine 08/01/2018  ? Depression   ? Gestational diabetes   ? Gonorrhea in female 10/05/2013  ? 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 Headache   ? Obesity   ? Seizures ([place]   ? "when I get too hot"  ? Trichomonas infection   ? UTI (lower urinary tract infection)   ? ? ?Past Surgical History:  ?Procedure Laterality Date  ? ADENOIDECTOMY    ? DILATION AND EVACUATION N/A 08/09/2013  ? Procedure: DILATATION AND EVACUATION;  Surgeon: BFrederico Hamman MD;  [facility];  Service: Gynecology;  Laterality: N/A;  ? ORTHOPEDIC SURGERY Left   ? left hand  ? TONSILLECTOMY    ? ? ?Family History  ?Problem Relation Age of Onset  ? Cancer Mother   ? Seizures Mother   ? Stroke Mother   ? Sickle cell anemia Mother   ? Hypertension Father   ? Seizures Maternal Aunt   ? Diabetes Maternal Grandmother   ? Colon cancer Maternal Grandmother   ? Hypertension Maternal Grandmother   ? Clotting  disorder Maternal Grandmother   ? Breast cancer Maternal Grandmother   ? Stroke Maternal Grandmother   ? Diabetes Paternal Grandmother   ? ? ?Social History  ? ?Tobacco Use  ? Smoking status: Never  ? Smokeless tobacco: Never  ?Vaping Use  ? Vaping Use: Never used  ?Substance Use Topics  ? Alcohol use: Not Currently  ?  Comment: occ  ? Drug use: No  ? ? ?Allergies:  ?Allergies  ?Allergen Reactions  ? Milk-Related Compounds Dermatitis  ?  Breaks face out ?*can have 2% milk*  ? Mushroom Extract Complex Anaphylaxis  ? Penicillins Anaphylaxis  ?  Has patient had a PCN reaction causing immediate rash, facial/tongue/throat swelling, SOB or lightheadedness with hypotension: Yes ?Has patient had a PCN reaction causing severe rash involving mucus membranes or skin necrosis: No ?Has patient had a PCN reaction that required hospitalization No ?Has patient had a PCN reaction occurring within the last 10 years: Yes ?If all of the above answers are "NO", then  may proceed with Cephalosporin use. ?  ? Cheese Other (See Comments)  ?  Breaks face out  ? Latex Itching and Swelling  ? Sulfa Antibiotics Swelling  ? Vicodin [Hydrocodone-Acetaminophen] Hives  ? ? ?Medications Prior to Admission  ?Medication Sig Dispense Refill Last Dose  ? Accu-Chek Softclix Lancets lancets 1 each by Other route 4 (four) times daily. Use as instructed 100 each 12 04/12/2021  ? acetaminophen (TYLENOL) 500 MG tablet Take 1,000 mg by mouth every 6 (six) hours as needed for headache.   04/12/2021  ? Blood Glucose Monitoring Suppl (ACCU-CHEK GUIDE) w/Device KIT 1 Device by Does not apply route in the morning, at noon, in the evening, and at bedtime. 1 kit 0 04/12/2021  ? Blood Pressure Monitoring (BLOOD PRESSURE CUFF) MISC 1 Device by Does not apply route once a week. 1 each 0 04/13/2021  ? Blood Pressure Monitoring (BLOOD PRESSURE KIT) DEVI 1 Device by Does not apply route once a week. 1 each 0 04/13/2021  ? ferrous sulfate 325 (65 FE) MG tablet Take 1 tablet (325 mg  total) by mouth 2 (two) times daily with a meal. 60 tablet 5 Past Week  ? lamoTRIgine (LAMICTAL) 200 MG tablet Take 0.5 tablets (100 mg total) by mouth 2 (two) times daily. 60 tablet 11 04/13/2021  ? levETIRAcetam (KEPPRA) 750 MG tablet Take 2 tablets (1,500 mg total) by mouth 2 (two) times daily. 120 tablet 11 04/13/2021  ? ondansetron (ZOFRAN ODT) 4 MG disintegrating tablet Take 1 tablet (4 mg total) by mouth every 8 (eight) hours as needed for nausea or vomiting. 12 tablet 0 Past Week  ? pantoprazole (PROTONIX) 40 MG tablet Take 1 tablet (40 mg total) by mouth daily. 30 tablet 4 04/13/2021  ? Prenatal Vit-Fe Fumarate-FA (PREPLUS) 27-1 MG TABS Take 1 tablet by mouth daily. 30 tablet 13 04/13/2021  ? albuterol (PROVENTIL HFA;VENTOLIN HFA) 108 (90 Base) MCG/ACT inhaler Inhale 2 puffs into the lungs every 6 (six) hours as needed for wheezing or shortness of breath. 1 Inhaler 0 More than a month  ? famotidine (PEPCID) 20 MG tablet Take 1 tablet (20 mg total) by mouth 2 (two) times daily. 30 tablet 3   ? glucose blood (ACCU-CHEK GUIDE) test strip Use as instructed 100 each 12   ? polyethylene glycol (MIRALAX / GLYCOLAX) 17 g packet Take 17 g by mouth 2 (two) times daily. (Patient not taking: Reported on 03/19/2021) 24 each 3   ? Prenatal Vit-Fe Fumarate-FA (PREPLUS) 27-1 MG TABS Take 1 tablet by mouth daily. (Patient not taking: Reported on 03/19/2021) 30 tablet 13   ? ? ?Review of Systems  ?Constitutional: Negative.   ?HENT: Negative.    ?Eyes: Negative.   ?Respiratory: Negative.    ?Cardiovascular: Negative.   ?Gastrointestinal: Negative.   ?Endocrine: Negative.   ?Genitourinary:  Positive for pelvic pain (LT side, sharp pain since ~ Jan 2023).  ?     DFM x 3 days  ?Musculoskeletal: Negative.   ?Skin: Negative.   ?Allergic/Immunologic: Negative.   ?Neurological: Negative.   ?Hematological: Negative.   ?Psychiatric/Behavioral: Negative.    ?Physical Exam  ? ?Blood pressure 104/66, pulse (!) 104, temperature 97.9 ?F (36.6 ?C),  temperature source Oral, resp. rate 20, last menstrual period 10/02/2020, SpO2 98 %, unknown if currently breastfeeding. ? ?Physical Exam ?Vitals and nursing note reviewed.  ?Constitutional:   ?   Appearance: Normal appearance. She is obese.  ?Cardiovascular:  ?   Rate and Rhythm: Tachycardia present.  ?Pulmonary:  ?  Effort: Pulmonary effort is normal.  ?Abdominal:  ?   Palpations: Abdomen is soft.  ?Genitourinary: ?   Comments: deferred ?Skin: ?   General: Skin is warm and dry.  ?Neurological:  ?   Mental Status: She is alert and oriented to person, place, and time.  ?Psychiatric:     ?   Mood and Affect: Mood normal.     ?   Behavior: Behavior normal.     ?   Thought Content: Thought content normal.     ?   Judgment: Judgment normal.  ? ? ?REASSURING NST - FHR: 135 bpm / moderate variability / accels present / decels absent / TOCO: none  ?MAU Course  ?Procedures ? ?MDM ?CCUA ?EFM ? ?Results for orders placed or performed during the hospital encounter of 04/13/21 (from the past 24 hour(s))  ?Urinalysis, Routine w reflex microscopic Urine, Clean Catch     Status: Abnormal  ? Collection Time: 04/13/21  3:40 PM  ?Result Value Ref Range  ? Color, Urine YELLOW YELLOW  ? APPearance HAZY (A) CLEAR  ? Specific Gravity, Urine 1.025 1.005 - 1.030  ? pH 6.0 5.0 - 8.0  ? Glucose, UA NEGATIVE NEGATIVE mg/dL  ? Hgb urine dipstick NEGATIVE NEGATIVE  ? Bilirubin Urine NEGATIVE NEGATIVE  ? Ketones, ur NEGATIVE NEGATIVE mg/dL  ? Protein, ur NEGATIVE NEGATIVE mg/dL  ? Nitrite NEGATIVE NEGATIVE  ? Leukocytes,Ua NEGATIVE NEGATIVE  ? ?Assessment and Plan  ?Pain of round ligament affecting pregnancy, antepartum  ?- Information provided on RLP ?- Advised to purchase maternity support belt off [place] ?  ?Reassuring NST  ?- Non-reactive d/t GA -- EFM tracing appropriate for GA ?- Information provided on [place]  ? ?[redacted] weeks gestation of pregnancy ? ?- Discharge patient ?- Keep scheduled appts with MFM and [facility] on 4/10 & 4/11 ?- Patient  verbalized an understanding of the plan of care and agrees.  ? ?Laury Deep, CNM ?04/13/2021, 4:08 PM  ?

## 2021-04-13 NOTE — Discharge Instructions (Signed)
Purchase a maternity support belt off [place] ?

## 2021-04-13 NOTE — MAU Note (Signed)
.  Latoya Stark is a 26 y.o. at 70w4dhere in MAU reporting: DFM x3 days. Last felt baby move yesterday. Denies VB or LOF. Also reporting left sharp lower abdominal pain.  ? ?Pain score: 8 ?FHT:155 ?Lab orders placed from triage:  UA ?

## 2021-04-18 ENCOUNTER — Ambulatory Visit: Payer: Medicaid Other | Admitting: *Deleted

## 2021-04-18 ENCOUNTER — Ambulatory Visit: Payer: Managed Care, Other (non HMO)

## 2021-04-18 ENCOUNTER — Ambulatory Visit (HOSPITAL_BASED_OUTPATIENT_CLINIC_OR_DEPARTMENT_OTHER): Payer: Medicaid Other | Admitting: *Deleted

## 2021-04-18 ENCOUNTER — Ambulatory Visit: Payer: Medicaid Other | Attending: Obstetrics

## 2021-04-18 ENCOUNTER — Encounter: Payer: Self-pay | Admitting: *Deleted

## 2021-04-18 DIAGNOSIS — G40909 Epilepsy, unspecified, not intractable, without status epilepticus: Secondary | ICD-10-CM

## 2021-04-18 DIAGNOSIS — E669 Obesity, unspecified: Secondary | ICD-10-CM

## 2021-04-18 DIAGNOSIS — O365930 Maternal care for other known or suspected poor fetal growth, third trimester, not applicable or unspecified: Secondary | ICD-10-CM

## 2021-04-18 DIAGNOSIS — Z3A28 28 weeks gestation of pregnancy: Secondary | ICD-10-CM

## 2021-04-18 DIAGNOSIS — O365920 Maternal care for other known or suspected poor fetal growth, second trimester, not applicable or unspecified: Secondary | ICD-10-CM | POA: Diagnosis present

## 2021-04-18 DIAGNOSIS — O0990 Supervision of high risk pregnancy, unspecified, unspecified trimester: Secondary | ICD-10-CM | POA: Diagnosis present

## 2021-04-18 DIAGNOSIS — O99353 Diseases of the nervous system complicating pregnancy, third trimester: Secondary | ICD-10-CM | POA: Diagnosis not present

## 2021-04-18 DIAGNOSIS — O99213 Obesity complicating pregnancy, third trimester: Secondary | ICD-10-CM | POA: Diagnosis not present

## 2021-04-18 DIAGNOSIS — O99212 Obesity complicating pregnancy, second trimester: Secondary | ICD-10-CM | POA: Insufficient documentation

## 2021-04-18 DIAGNOSIS — O24419 Gestational diabetes mellitus in pregnancy, unspecified control: Secondary | ICD-10-CM

## 2021-04-18 NOTE — Procedures (Signed)
DOROTEA HAND ?[birth year] ?[redacted]w[redacted]d? ?Fetus A Non-Stress Test Interpretation for 04/18/21 ? ?Indication: IUGR ? ?Fetal Heart Rate A ?Mode: External ?Baseline Rate (A): 150 bpm ?Variability: Minimal, Moderate ?Accelerations: 15 x 15 ?Decelerations: None ?Multiple birth?: No ? ?Uterine Activity ?Mode: Palpation, Toco ?Contraction Frequency (min): none ?Resting Tone Palpated: Relaxed ? ?Interpretation (Fetal Testing) ?Nonstress Test Interpretation: Reactive ?Overall Impression: Reassuring for gestational age ?Comments: Dr. SDonalee Citrinreviewed tracing. ? ? ?

## 2021-04-19 ENCOUNTER — Encounter: Payer: Self-pay | Admitting: Medical

## 2021-04-19 ENCOUNTER — Ambulatory Visit (INDEPENDENT_AMBULATORY_CARE_PROVIDER_SITE_OTHER): Payer: Medicaid Other | Admitting: Medical

## 2021-04-19 VITALS — BP 119/77 | HR 89 | Wt 327.0 lb

## 2021-04-19 DIAGNOSIS — O24410 Gestational diabetes mellitus in pregnancy, diet controlled: Secondary | ICD-10-CM

## 2021-04-19 DIAGNOSIS — O0990 Supervision of high risk pregnancy, unspecified, unspecified trimester: Secondary | ICD-10-CM

## 2021-04-19 DIAGNOSIS — Z3A28 28 weeks gestation of pregnancy: Secondary | ICD-10-CM

## 2021-04-19 DIAGNOSIS — O365920 Maternal care for other known or suspected poor fetal growth, second trimester, not applicable or unspecified: Secondary | ICD-10-CM | POA: Insufficient documentation

## 2021-04-19 DIAGNOSIS — O24419 Gestational diabetes mellitus in pregnancy, unspecified control: Secondary | ICD-10-CM | POA: Insufficient documentation

## 2021-04-19 DIAGNOSIS — G43709 Chronic migraine without aura, not intractable, without status migrainosus: Secondary | ICD-10-CM

## 2021-04-19 DIAGNOSIS — G40909 Epilepsy, unspecified, not intractable, without status epilepticus: Secondary | ICD-10-CM

## 2021-04-19 DIAGNOSIS — Z6841 Body Mass Index (BMI) 40.0 and over, adult: Secondary | ICD-10-CM

## 2021-04-19 DIAGNOSIS — O99353 Diseases of the nervous system complicating pregnancy, third trimester: Secondary | ICD-10-CM

## 2021-04-19 HISTORY — DX: Gestational diabetes mellitus in pregnancy, unspecified control: O24.419

## 2021-04-19 NOTE — Progress Notes (Signed)
? ?  PRENATAL VISIT NOTE ? ?Subjective:  ?Latoya Stark is a 26 y.o. [phone] at 59w3dbeing seen today for ongoing prenatal care.  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 ? ?Patient reports headache and mid abdominal pain.  Contractions: Not present. Vag. Bleeding: None.  Movement: Present. Denies leaking of fluid.  ? ?The following portions of the patient's history were reviewed and updated as appropriate: allergies, current medications, past family history, past medical history, past social history, past surgical history and problem list.  ? ?Objective:  ? ?Vitals:  ? 04/19/21 1149  ?BP: 119/77  ?Pulse: 89  ?Weight: (!) 327 lb (148.3 kg)  ? ? ?Fetal Status: Fetal Heart Rate (bpm): 143   Movement: Present    ? ?General:  Alert, oriented and cooperative. Patient is in no acute distress.  ?Skin: Skin is warm and dry. No rash noted.   ?Cardiovascular: Normal heart rate noted  ?Respiratory: Normal respiratory effort, no problems with respiration noted  ?Abdomen: Soft, gravid, appropriate for gestational age.  Pain/Pressure: Present     ?Pelvic: Cervical exam deferred        ?Extremities: Normal range of motion.     ?Mental Status: Normal mood and affect. Normal behavior. Normal judgment and thought content.  ? ?Assessment and  Plan:  ?Pregnancy: [id number] 223w3d1. Supervision of high risk pregnancy, antepartum ? ?2. BMI 50.0-59.9, adult (HCBelfonte? ?3. Seizure disorder during pregnancy in third trimester (HCForestville?- no recent activity  ? ?4. Chronic migraine without aura without status migrainosus, not intractable ?- still having headaches 2x/week ?- plans to follow-up with Dr. SaKerman Passeyor changes in therapy postpartum  ? ?5. Diet controlled gestational diabetes mellitus (GDM) in third trimester ?- Had elevated FBG yesterday, normal today ?- Advised to check QID and bring log to every visit ? ?6. Poor fetal growth affecting management of mother in second trimester, single or unspecified fetus ?- Resolved at last US[place]20% EFW ? ?7. [redacted] weeks gestation of pregnancy ? ?Preterm labor symptoms and general obstetric precautions including but not limited to vaginal bleeding, contractions, leaking of fluid and fetal movement were reviewed in detail with the patient. ?Please refer to After Visit Summary for other counseling recommendations.  ? ?Return in about 2 weeks (around 05/03/2021) [facility]. ? ?Future Appointments  ?Date Time Provider [place]?04/21/2021  2:15 PM W[facility]-EDUCATION [facility]-CWH WMRefugio?04/25/2021  8:30 AM WMC-MFC NURSE [facility]-MFC WMC  ?04/25/2021  8:45 AM WMC-MFC US6 WMC-MFCUS WMC  ?04/25/2021  9:45 AM WMC-MFC NST WMC-MFC WMC  ?05/03/2021 11:15 AM Constant, PeVickii ChafeMD [facility] None  ?09/21/2021 11:00 AM Lomax, Amy, NP [facility] None  ? ? ?JuKerry HoughPA-C ? ?

## 2021-04-19 NOTE — Progress Notes (Signed)
Patient reports mid abdominal pain/pressure that started Sunday  ?

## 2021-04-21 ENCOUNTER — Other Ambulatory Visit: Payer: Managed Care, Other (non HMO)

## 2021-04-25 ENCOUNTER — Ambulatory Visit: Payer: Medicaid Other | Attending: Obstetrics

## 2021-04-25 ENCOUNTER — Ambulatory Visit (HOSPITAL_BASED_OUTPATIENT_CLINIC_OR_DEPARTMENT_OTHER): Payer: Medicaid Other | Admitting: *Deleted

## 2021-04-25 ENCOUNTER — Ambulatory Visit: Payer: Medicaid Other | Admitting: *Deleted

## 2021-04-25 ENCOUNTER — Other Ambulatory Visit: Payer: Self-pay | Admitting: *Deleted

## 2021-04-25 VITALS — BP 129/72 | HR 105

## 2021-04-25 DIAGNOSIS — O24419 Gestational diabetes mellitus in pregnancy, unspecified control: Secondary | ICD-10-CM | POA: Diagnosis not present

## 2021-04-25 DIAGNOSIS — O99353 Diseases of the nervous system complicating pregnancy, third trimester: Secondary | ICD-10-CM | POA: Diagnosis not present

## 2021-04-25 DIAGNOSIS — O99212 Obesity complicating pregnancy, second trimester: Secondary | ICD-10-CM | POA: Insufficient documentation

## 2021-04-25 DIAGNOSIS — Z6841 Body Mass Index (BMI) 40.0 and over, adult: Secondary | ICD-10-CM

## 2021-04-25 DIAGNOSIS — O365920 Maternal care for other known or suspected poor fetal growth, second trimester, not applicable or unspecified: Secondary | ICD-10-CM | POA: Diagnosis present

## 2021-04-25 DIAGNOSIS — Z3A29 29 weeks gestation of pregnancy: Secondary | ICD-10-CM | POA: Diagnosis not present

## 2021-04-25 DIAGNOSIS — O0990 Supervision of high risk pregnancy, unspecified, unspecified trimester: Secondary | ICD-10-CM

## 2021-04-25 DIAGNOSIS — O99213 Obesity complicating pregnancy, third trimester: Secondary | ICD-10-CM | POA: Diagnosis not present

## 2021-04-25 DIAGNOSIS — O24410 Gestational diabetes mellitus in pregnancy, diet controlled: Secondary | ICD-10-CM

## 2021-04-25 DIAGNOSIS — G40909 Epilepsy, unspecified, not intractable, without status epilepticus: Secondary | ICD-10-CM

## 2021-04-25 DIAGNOSIS — O365930 Maternal care for other known or suspected poor fetal growth, third trimester, not applicable or unspecified: Secondary | ICD-10-CM | POA: Diagnosis not present

## 2021-04-25 NOTE — Procedures (Signed)
TENIYA FILTER ?1995-02-11 ?[redacted]w[redacted]d? ?Fetus A Non-Stress Test Interpretation for 04/25/21 ? ?Indication: Gestational Diabetes medication controlled ? ?Fetal Heart Rate A ?Mode: External ?Baseline Rate (A): 145 bpm ?Variability: Moderate ?Accelerations: 15 x 15 ?Decelerations: None ?Multiple birth?: No ? ?Uterine Activity ?Mode: Toco ?Contraction Frequency (min): none ?Resting Tone Palpated: Relaxed ? ?Interpretation (Fetal Testing) ?Nonstress Test Interpretation: Reactive ?Overall Impression: Reassuring for gestational age ?Comments: tracing reviewed by Dr.Fang ? ? ?

## 2021-04-26 ENCOUNTER — Telehealth: Payer: Self-pay | Admitting: Emergency Medicine

## 2021-04-26 NOTE — Telephone Encounter (Signed)
TC returned to pt. 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 ?Pt requesting documentation from [facility] provider for reducing work hours. ?Discussed that patient should discuss needs with provider on 4/25. ?

## 2021-04-28 ENCOUNTER — Encounter: Payer: Self-pay | Admitting: Obstetrics and Gynecology

## 2021-04-28 ENCOUNTER — Other Ambulatory Visit: Payer: Self-pay

## 2021-04-28 ENCOUNTER — Observation Stay
Admission: EM | Admit: 2021-04-28 | Discharge: 2021-04-28 | Disposition: A | Discharge status: Home / Self Care | Payer: Medicaid Other

## 2021-04-28 DIAGNOSIS — Z3A29 29 weeks gestation of pregnancy: Secondary | ICD-10-CM | POA: Insufficient documentation

## 2021-04-28 DIAGNOSIS — O0990 Supervision of high risk pregnancy, unspecified, unspecified trimester: Secondary | ICD-10-CM

## 2021-04-28 DIAGNOSIS — Z91040 Latex allergy status: Secondary | ICD-10-CM | POA: Insufficient documentation

## 2021-04-28 DIAGNOSIS — Z79899 Other long term (current) drug therapy: Secondary | ICD-10-CM | POA: Diagnosis not present

## 2021-04-28 DIAGNOSIS — R109 Unspecified abdominal pain: Secondary | ICD-10-CM | POA: Diagnosis not present

## 2021-04-28 DIAGNOSIS — O26893 Other specified pregnancy related conditions, third trimester: Secondary | ICD-10-CM | POA: Diagnosis present

## 2021-04-28 DIAGNOSIS — E669 Obesity, unspecified: Secondary | ICD-10-CM | POA: Diagnosis not present

## 2021-04-28 DIAGNOSIS — J45909 Unspecified asthma, uncomplicated: Secondary | ICD-10-CM | POA: Insufficient documentation

## 2021-04-28 DIAGNOSIS — O99513 Diseases of the respiratory system complicating pregnancy, third trimester: Secondary | ICD-10-CM | POA: Insufficient documentation

## 2021-04-28 DIAGNOSIS — O99213 Obesity complicating pregnancy, third trimester: Secondary | ICD-10-CM | POA: Diagnosis not present

## 2021-04-28 DIAGNOSIS — O26899 Other specified pregnancy related conditions, unspecified trimester: Secondary | ICD-10-CM | POA: Diagnosis present

## 2021-04-28 NOTE — Discharge Summary (Signed)
Latoya Stark is a 26 y.o. female. She is at 49w5dgestation. Patient's last menstrual period was 10/02/2020 (approximate). ?Estimated Date of Delivery: 07/09/21 ? ?[facility] [place]? ?Chief complaint: cramping  ? ?HPI: Latoya Stark to L&D with complaints of cramping since 2100.  States she was chasing her toddler around and began contracting.  She tried Tylenol but did not have any relief with it.  She denies LOF or vaginal bleeding.  Endorses good fetal movement.  Reports that cramping has resolved since arrival to L&D.    ? ?Factors complicating pregnancy: ?Obesity in pregnancy  ?Seizure disorder  ?A1GDM ?Poor fetal growth affecting fetus - resolved 04/25/2021 ? ?S: Resting comfortably. no CTX, no VB.no LOF,  Active fetal movement.  ? 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   ? ?Past Surgical Hx:  has a past surgical history that includes orthopedic surgery (Left); Adenoidectomy; Dilation and evacuation (N/A, 08/09/2013); and Tonsillectomy.  ? ?Allergies  ?Allergen Reactions  ? Milk-Related Compounds Dermatitis  ?  Breaks face out ?*can have 2% milk*  ? Mushroom Extract Complex Anaphylaxis  ? Penicillins Anaphylaxis  ?  Has patient had a PCN reaction causing immediate rash, facial/tongue/throat swelling, SOB or lightheadedness with hypotension: Yes ?Has patient had a PCN reaction causing severe rash involving mucus membranes or skin necrosis: No ?Has patient had a PCN reaction that required hospitalization No ?Has patient had a PCN reaction occurring within the last 10 years: Yes ?If all of the above  answers are "NO", then may proceed with Cephalosporin use. ?  ? Cheese Other (See Comments)  ?  Breaks face out  ? Latex Itching and Swelling  ? Sulfa Antibiotics Swelling  ? Vicodin [Hydrocodone-Acetaminophen] Hives  ?  ? ?Prior to Admission medications   ?Medication Sig Start Date End Date Taking? Authorizing Provider  ?acetaminophen (TYLENOL) 500 MG tablet Take 1,000 mg by mouth every 6 (six) hours as needed for headache.   Yes [provider]  ?ferrous sulfate 325 (65 FE) MG tablet Take 1 tablet (325 mg total) by mouth 2 (two) times daily with a meal. 04/16/19  Yes HShelly Bombard MD  ?lamoTRIgine (LAMICTAL) 200 MG tablet Take 0.5 tablets (100 mg total) by mouth 2 (two) times daily. 03/15/21  Yes Sater, RNanine Means MD  ?levETIRAcetam (KEPPRA) 750 MG tablet Take 2 tablets (1,500 mg total) by mouth 2 (two) times daily. 06/22/20 06/17/21 Yes Sater, RNanine Means MD  ?ondansetron (ZOFRAN ODT) 4 MG disintegrating tablet Take 1 tablet (4 mg total) by mouth every 8 (eight) hours as needed for nausea or vomiting. 03/22/21  Yes AWoodroe Mode MD  ?pantoprazole (PROTONIX) 40 MG tablet Take 1 tablet (40 mg total) by mouth daily. 03/08/21 03/08/22 Yes AWoodroe Mode MD  ?Prenatal Vit-Fe Fumarate-FA (PREPLUS) 27-1 MG TABS Take 1 tablet by mouth daily. 03/17/21  Yes BGriffin Basil MD  ?Accu-Chek Softclix Lancets lancets 1 each by Other route 4 (four) times daily. Use as instructed 04/06/21   AWoodroe Mode MD  ?albuterol (PROVENTIL HFA;VENTOLIN HFA) 108 (90 Base) MCG/ACT inhaler Inhale 2 puffs into the lungs every 6 (six) hours as needed for wheezing or shortness of  breath. ?Patient not taking: Reported on 04/13/2021 08/20/17   Laban Emperor, PA-C  ?Blood Glucose Monitoring Suppl (ACCU-CHEK GUIDE) w/Device KIT 1 Device by Does not apply route in the morning, at noon, in the evening, and at bedtime. 04/06/21   Woodroe Mode, MD  ?Blood Pressure Monitoring (BLOOD PRESSURE CUFF) MISC 1 Device by Does not apply route once a  week. 12/25/18   Sloan Leiter, MD  ?Blood Pressure Monitoring (BLOOD PRESSURE KIT) DEVI 1 Device by Does not apply route once a week. 12/13/20   Woodroe Mode, MD  ?famotidine (PEPCID) 20 MG tablet Take 1 tablet (20 mg total) by mouth 2 (two) times daily. ?Patient not taking: Reported on 04/13/2021 01/29/21   Wende Mott, CNM  ?glucose blood (ACCU-CHEK GUIDE) test strip Use as instructed 04/06/21   Woodroe Mode, MD  ?polyethylene glycol (MIRALAX / GLYCOLAX) 17 g packet Take 17 g by mouth 2 (two) times daily. 11/11/18   FairMarin Shutter, MD  ?Prenatal Vit-Fe Fumarate-FA (PREPLUS) 27-1 MG TABS Take 1 tablet by mouth daily. 12/13/20   Woodroe Mode, MD  ? ? ?Social History: She  reports that she has never smoked. She has never used smokeless tobacco. She reports that she does not currently use alcohol. She reports that she does not use drugs. ? ?Family History: family history includes Breast cancer in her maternal grandmother; Cancer in her mother; Clotting disorder in her maternal grandmother; Colon cancer in her maternal grandmother; Diabetes in her maternal grandmother and paternal grandmother; Hypertension in her father and maternal grandmother; Seizures in her maternal aunt and mother; Sickle cell anemia in her mother; Stroke in her maternal grandmother and mother.  ? ?Review of Systems: A full review of systems was performed and negative except as noted in the HPI.   ? ?O: ? BP 118/73 (BP Location: Left Arm)   Pulse (!) 103   Temp 98.1 ?F (36.7 ?C) (Oral)   Resp 16   Ht _0  (1.626 m)   Wt (!) 148.3 kg   LMP 10/02/2020 (Approximate)   BMI 56.13 kg/m?  ?No results found for this or any previous visit (from the past 48 hour(s)).  ? ?Constitutional: NAD, AAOx3  ?HE/ENT: extraocular movements grossly intact, moist mucous membranes ?CV: RRR ?PULM: nl respiratory effort ?Abd: gravid, non-tender, non-distended, soft  ?Ext: Non-tender, Nonedmeatous ?Psych: mood appropriate, speech normal ?Pelvic :  deferred ? ?Fetal Monitor: ?Baseline: 150 bpm ?Variability: moderate ?Accels: Present - appropriate for gestational age ?Decels: none ?Toco: none ? ?Category: I ?NST: Reactive - appropriate for gestational age  ? ? ?Assessment: 26 y.o. 30w5dhere for antenatal surveillance during pregnancy. ? ?Principle diagnosis: Cramping affecting pregnancy, antepartum [O26.899, R10.9]  ? ?Plan: ?Labor: not present.  ?Denies cramping or contractions since arrival to L&D ?Symptoms improved with rest and hydration  ?NST reviewed, Reactive NST - appropriate for gestational age  ?Fetal Wellbeing: Reassuring Cat 1 tracing. ?Preterm labor precautions reviewed  ?D[facility]ed [facility].  [facility] that [facility].  Encour[facility] [place] [facility] [place] [facility] for high risk pregnancy for evaluations if possible.   ?D/c home stable, precautions reviewed, follow-up as scheduled.  ? ?----- ?ADrinda Butts CNM ?Certified Nurse Midwife ?[facility] ? ? ?

## 2021-04-28 NOTE — OB Triage Note (Signed)
Pt Latoya Stark 26 y.o. presents to labor and delivery triage reporting contractions q 5 minutes since around 2100. Pt is a [id number] at [redacted]w[redacted]d.She states she was chasing her toddler around and then began contracting. She states she tried a warm shower, walking, sitting on the toilet, laying on her side and back, and 650 mg of Tylenol with no relief. She states she has been drinking plenty of water today. She states she was nauseous earlier in the evening but denies nausea currently. She also states she has not felt contractions since arriving to L&D. Pt denies signs and symptoms consistent with rupture of membranes or active vaginal bleeding. Pt states positive fetal movement. External FM and TOCO applied to non-tender abdomen and assessing. Initial FHR 140. Vital signs obtained and within normal limits. ADrinda Butts CNM notified of pt arrival. Plan to observe for one hour and discharge if no contractions or concerns ? ?

## 2021-04-28 NOTE — OB Triage Note (Signed)
Pt discharged home per order.  Pt stable and ambulatory and an After Visit Summary was printed and given to the patient. Discharge education completed with patient including follow up instructions, appointments, and medication list. Pt received labor and bleeding precautions. Patient able to verbalize understanding, all questions fully answered upon discharge. Patient instructed to return to ED, call 911, or call MD for any changes in condition. Pt discharged home via personal vehicle with all belongings.   ?

## 2021-05-02 ENCOUNTER — Other Ambulatory Visit: Payer: Self-pay | Admitting: Obstetrics & Gynecology

## 2021-05-02 DIAGNOSIS — O219 Vomiting of pregnancy, unspecified: Secondary | ICD-10-CM

## 2021-05-03 ENCOUNTER — Encounter: Payer: Medicaid Other | Admitting: Obstetrics and Gynecology

## 2021-05-04 ENCOUNTER — Encounter: Payer: Medicaid Other | Admitting: Obstetrics and Gynecology

## 2021-05-04 ENCOUNTER — Ambulatory Visit (INDEPENDENT_AMBULATORY_CARE_PROVIDER_SITE_OTHER): Payer: Managed Care, Other (non HMO) | Admitting: Licensed Clinical Social Worker

## 2021-05-04 ENCOUNTER — Telehealth: Payer: Self-pay

## 2021-05-04 DIAGNOSIS — O99891 Other specified diseases and conditions complicating pregnancy: Secondary | ICD-10-CM | POA: Diagnosis not present

## 2021-05-04 DIAGNOSIS — Z8659 Personal history of other mental and behavioral disorders: Secondary | ICD-10-CM

## 2021-05-04 NOTE — Telephone Encounter (Signed)
Returned call and advised pt to discuss conditions of being out of work with provider at next visit, pt agreed. ?

## 2021-05-04 NOTE — BH Specialist Note (Signed)
Integrated Behavioral Health via Telemedicine Visit ? ?05/04/2021 ?Latoya Stark ?[id number] ? ?Number of [place] Clinician visits: 5 ?Session Start time: 1:00pm  ?Session End time: 1:54pm ?Total time in minutes: 54 mins via mychart video  ? ?Referring Provider: Dr. Roselie Awkward  ?Patient/Family location: Home  ?[facility] Provider location: [facility]  ?All persons participating in visit: Pt Latoya Stark and LCSW A. [name]  ?Types of Service: Individual psychotherapy and Video visit ? ?I connected with Latoya Stark and/or Latoya Stark via  Telephone or Video Enabled Telemedicine Application  (Video is Caregility application) and verified that I am speaking with the correct person using two identifiers. Discussed confidentiality: Yes  ? ?I discussed the limitations of telemedicine and the availability of in person appointments.  Discussed there is a possibility of technology failure and discussed alternative modes of communication if that failure occurs. ? ?I discussed that engaging in this telemedicine visit, they consent to the provision of behavioral healthcare and the services will be billed under their insurance. ? ?Patient and/or legal guardian expressed understanding and consented to Telemedicine visit: Yes  ? ?Presenting Concerns: ?Patient and/or family reports the following symptoms/concerns: history of depression  ?Duration of problem: over on year ; Severity of problem: mild ? ?Patient and/or Family's Strengths/Protective Factors: ?Concrete supports in place (healthy food, safe environments, etc.) ? ?Goals Addressed: ?Patient will: ? Reduce symptoms of: depression  ? Increase knowledge and/or ability of: coping skills  ? Demonstrate ability to: Increase healthy adjustment to current life circumstances ? ?Progress towards Goals: ?Ongoing ? ?Interventions: ?Interventions utilized:  Supportive Counseling ?Standardized Assessments completed: PHQ 9 ? ?Patient and/or Family Response: Latoya Stark  responded well to visit   ? ?Assessment: ?Patient has a history of depression.  ? ?Patient may benefit from integrated behavioral health. ? ?Plan: ?Follow up with behavioral health clinician on : 3 weeks  ?Behavioral recommendations: Write down questions for appt 05/09/2021, decrease social isolation, prioritize rest  ?Referral(s): [facility] (In Clinic) ? ?I discussed the assessment and treatment plan with the patient and/or parent/guardian. They were provided an opportunity to ask questions and all were answered. They agreed with the plan and demonstrated an understanding of the instructions. ?  ?They were advised to call back or seek an in-person evaluation if the symptoms worsen or if the condition fails to improve as anticipated. ? ?Lynnea Ferrier, LCSW ?

## 2021-05-08 ENCOUNTER — Other Ambulatory Visit: Payer: Self-pay | Admitting: Obstetrics & Gynecology

## 2021-05-08 DIAGNOSIS — O219 Vomiting of pregnancy, unspecified: Secondary | ICD-10-CM

## 2021-05-09 ENCOUNTER — Ambulatory Visit (INDEPENDENT_AMBULATORY_CARE_PROVIDER_SITE_OTHER): Payer: Medicaid Other | Admitting: Obstetrics and Gynecology

## 2021-05-09 ENCOUNTER — Encounter: Payer: Self-pay | Admitting: Obstetrics and Gynecology

## 2021-05-09 VITALS — BP 129/91 | HR 112 | Wt 329.7 lb

## 2021-05-09 DIAGNOSIS — O365920 Maternal care for other known or suspected poor fetal growth, second trimester, not applicable or unspecified: Secondary | ICD-10-CM

## 2021-05-09 DIAGNOSIS — Z87898 Personal history of other specified conditions: Secondary | ICD-10-CM

## 2021-05-09 DIAGNOSIS — O0990 Supervision of high risk pregnancy, unspecified, unspecified trimester: Secondary | ICD-10-CM

## 2021-05-09 DIAGNOSIS — O24410 Gestational diabetes mellitus in pregnancy, diet controlled: Secondary | ICD-10-CM

## 2021-05-09 MED ORDER — METFORMIN HCL 500 MG PO TABS: 500.0000 mg | ORAL_TABLET | Freq: Two times a day (BID) | ORAL | 5 refills | Status: DC

## 2021-05-09 NOTE — Progress Notes (Addendum)
? ?PRENATAL VISIT NOTE ? ?Subjective:  ?Latoya Stark is a 26 y.o. [phone] at 65w2dbeing seen today for ongoing prenatal care.  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 ? ?Patient reports no complaints.  Contractions: Not present. Vag. Bleeding: None.  Movement: Present. Denies leaking of fluid.  ? ?The following portions of the patient's history were reviewed and updated as appropriate: allergies, current medications, past family history, past medical history, past social history, past surgical history and problem list.  ? ?Objective:  ? ?Vitals:  ? 05/09/21 1053  ?BP: (!) 129/91  ?Pulse: (!) 112  ?Weight: (!) 329 lb 11.2 oz (149.6 kg)  ? ? ?Fetal Status: Fetal Heart Rate (bpm): 143   Movement: Present    ? ?General:  Alert, oriented and cooperative. Patient is in no acute distress.  ?Skin: Skin is warm and dry. No rash noted.   ?Cardiovascular: Normal heart rate noted  ?Respiratory: Normal respiratory effort, no problems with respiration noted  ?Abdomen: Soft, gravid, appropriate for gestational age.  Pain/Pressure: Present     ?Pelvic: Cervical exam deferred        ?Extremities: Normal range of motion.     ?Mental Status: Normal mood and affect. Normal behavior. Normal judgment and thought content.  ? ?Assessment and Plan:  ?Pregnancy: [id number] 370w2d1. Supervision of high risk pregnancy,  antepartum ?Patient is doing well without complaints ?[facility] been placed out of work by neurologist as they are optimizing her medication to decrease frequency of seizures in pregnancy. Patient works in an environment with different chemicals which are not deemed safe for continuous exposure during pregnancy. Work has not been able to assign her to different duties. We will complete paperwork to allow to extend short term disability into long term disability until the end of her pregnancy ? ? ?2. Diet controlled gestational diabetes mellitus (GDM) in third trimester ?CBGs reviewed and all values elevated. Patient is also checking before and after meals ?She admits to dietary indiscretions ?Rx metformin 500 BID provided ?Patient was strongly encouraged to walk 30 minutes daily ? ?3. History of seizure ?Dosage recently increased by neurologist ? ?4. Poor fetal growth affecting management of mother in second trimester, single or unspecified fetus ?EFW 24%tile on 4/17 scan ?Follow scan as scheduled by MFM ? ?Preterm labor symptoms and general obstetric precautions including but not limited to vaginal bleeding, contractions, leaking of fluid and fetal movement were reviewed in detail with the patient. ?Please refer to After Visit Summary for other counseling recommendations.  ? ?Return in about 2 weeks (around 05/23/2021) for in person, ROB, High risk. ? ?Future Appointments  ?Date Time Provider [place]?05/23/2021 10:30 AM [facility] NURSE [facility]  ?05/23/2021 10:45 AM [facility] US4 [facility]  ?05/30/2021 10:30 AM [facility] NURSE [facility]  ?05/30/2021 10:45 AM [facility] US4 [facility]  ?09/21/2021 11:00 AM Lomax, Amy, NP [facility] None  ? ? ?PeMora BellmanMD ? ?

## 2021-05-09 NOTE — Progress Notes (Signed)
Pt reports fetal movement with some pelvic and upper abdominal pressure. Pt reports fasting BG 145 today ?

## 2021-05-12 ENCOUNTER — Other Ambulatory Visit: Payer: Self-pay

## 2021-05-12 ENCOUNTER — Inpatient Hospital Stay (HOSPITAL_COMMUNITY)
Admission: AD | Admit: 2021-05-12 | Discharge: 2021-05-12 | Disposition: A | Discharge status: Home / Self Care | Payer: Medicaid Other | Attending: Obstetrics & Gynecology | Admitting: Obstetrics & Gynecology

## 2021-05-12 ENCOUNTER — Other Ambulatory Visit: Payer: Self-pay | Admitting: *Deleted

## 2021-05-12 ENCOUNTER — Encounter (HOSPITAL_COMMUNITY): Payer: Self-pay | Admitting: Obstetrics & Gynecology

## 2021-05-12 DIAGNOSIS — Z3689 Encounter for other specified antenatal screening: Secondary | ICD-10-CM | POA: Diagnosis not present

## 2021-05-12 DIAGNOSIS — Z8669 Personal history of other diseases of the nervous system and sense organs: Secondary | ICD-10-CM

## 2021-05-12 DIAGNOSIS — Z3A31 31 weeks gestation of pregnancy: Secondary | ICD-10-CM | POA: Diagnosis not present

## 2021-05-12 DIAGNOSIS — O219 Vomiting of pregnancy, unspecified: Secondary | ICD-10-CM | POA: Diagnosis not present

## 2021-05-12 DIAGNOSIS — O212 Late vomiting of pregnancy: Secondary | ICD-10-CM | POA: Insufficient documentation

## 2021-05-12 DIAGNOSIS — O368130 Decreased fetal movements, third trimester, not applicable or unspecified: Secondary | ICD-10-CM | POA: Insufficient documentation

## 2021-05-12 DIAGNOSIS — Z880 Allergy status to penicillin: Secondary | ICD-10-CM | POA: Insufficient documentation

## 2021-05-12 DIAGNOSIS — Z3493 Encounter for supervision of normal pregnancy, unspecified, third trimester: Secondary | ICD-10-CM

## 2021-05-12 DIAGNOSIS — O0990 Supervision of high risk pregnancy, unspecified, unspecified trimester: Secondary | ICD-10-CM

## 2021-05-12 LAB — URINALYSIS, ROUTINE W REFLEX MICROSCOPIC
Bilirubin Urine: NEGATIVE
Glucose, UA: NEGATIVE mg/dL
Hgb urine dipstick: NEGATIVE
Ketones, ur: NEGATIVE mg/dL
Leukocytes,Ua: NEGATIVE
Nitrite: NEGATIVE
Protein, ur: NEGATIVE mg/dL
Specific Gravity, Urine: 1.024 (ref 1.005–1.030)
pH: 6 (ref 5.0–8.0)

## 2021-05-12 MED ORDER — METOCLOPRAMIDE HCL 10 MG PO TABS
10.0000 mg | ORAL_TABLET | Freq: Once | ORAL | Status: AC
  Administered 2021-05-12: 10 mg via ORAL
  Filled 2021-05-12: qty 1

## 2021-05-12 MED ORDER — PROMETHAZINE HCL 25 MG PO TABS: 25.0000 mg | ORAL_TABLET | Freq: Four times a day (QID) | ORAL | 2 refills | Status: DC | PRN

## 2021-05-12 MED ORDER — PANTOPRAZOLE SODIUM 40 MG PO TBEC: 40.0000 mg | DELAYED_RELEASE_TABLET | Freq: Two times a day (BID) | ORAL | 4 refills | Status: DC

## 2021-05-12 MED ORDER — METOCLOPRAMIDE HCL 10 MG PO TABS: 10.0000 mg | ORAL_TABLET | Freq: Four times a day (QID) | ORAL | 2 refills | Status: DC | PRN

## 2021-05-12 MED ORDER — ONDANSETRON 4 MG PO TBDP: 4.0000 mg | ORAL_TABLET | Freq: Three times a day (TID) | ORAL | 2 refills | Status: DC | PRN

## 2021-05-12 MED ORDER — SCOPOLAMINE 1 MG/3DAYS TD PT72
1.0000 | MEDICATED_PATCH | TRANSDERMAL | Status: DC
  Administered 2021-05-12: 1.5 mg via TRANSDERMAL
  Filled 2021-05-12: qty 1

## 2021-05-12 MED ORDER — BUTALBITAL-APAP-CAFFEINE 50-325-40 MG PO TABS: 2.0000 | ORAL_TABLET | Freq: Four times a day (QID) | ORAL | 0 refills | Status: DC | PRN

## 2021-05-12 NOTE — Discharge Instructions (Signed)

## 2021-05-12 NOTE — MAU Provider Note (Signed)
?History  ?  ? ?CSN: [id number] ? ?Arrival date and time: 05/12/21 8676 ? ? Event Date/Time  ? First Provider Initiated Contact with Patient 05/12/21 1014   ?  ? ?Chief Complaint  ?Patient presents with  ? Emesis  ? Nausea  ? Decreased Fetal Movement  ? ?HPI ?Latoya Stark is a 26 y.o. [phone] at 1w5dwho presents with several complaints. She reports her zofran is no longer working well for her and she is needing something more for nausea. She reports she has not been able to keep anything down today. She also feels like her acid reflux is getting worse. She also reports she hasn't felt the baby move as much today since she has been vomiting. She denies any abdominal pain, vaginal bleeding or discharge. She also reports her chronic migraines are getting worse and is requesting help with management. Patient drove self to hospital today ? ?OB History   ? ? Gravida  ?8  ? Para  ?1  ? Term  ?1  ? Preterm  ?   ? AB  ?6  ? Living  ?1  ?  ? ? SAB  ?4  ? IAB  ?1  ? Ectopic  ?   ? Multiple  ?   ? Live Births  ?1  ?   ?  ?  ? ? ?Past Medical History:  ?Diagnosis Date  ? ADHD (attention deficit hyperactivity disorder)   ? Anemia   ? Anxiety   ? Asthma   ? Chronic migraine 08/01/2018  ? Depression   ? Gestational diabetes   ? Gonorrhea in female 10/05/2013  ? 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 Headache   ? Obesity   ? Seizures ([facility]   ? "when I get too hot"  ? Trichomonas infection   ? UTI (lower urinary tract infection)   ? ? ?Past Surgical History:  ?Procedure Laterality Date  ? ADENOIDECTOMY    ? DILATION AND EVACUATION N/A 08/09/2013  ? Procedure: DILATATION AND EVACUATION;  Surgeon: BFrederico Hamman MD;  [facility];  Service: Gynecology;  Laterality: N/A;  ? ORTHOPEDIC SURGERY Left   ? left hand  ? TONSILLECTOMY    ? ? ?Family History  ?Problem Relation Age of Onset  ? Heart disease Mother   ? Cancer Mother   ?     Bone cancer  ? Seizures Mother   ? Stroke Mother   ? Sickle cell anemia Mother   ?  Hypertension Father   ? Seizures Maternal Aunt   ? Diabetes Maternal Grandmother   ? Colon cancer Maternal Grandmother   ? Hypertension Maternal Grandmother   ? Clotting disorder Maternal Grandmother   ? Breast cancer Maternal Grandmother   ? Stroke Maternal Grandmother   ? Diabetes Paternal Grandmother   ? ? ?Social History  ? ?Tobacco Use  ? Smoking status: Never  ? Smokeless tobacco: Never  ?Vaping Use  ? Vaping Use: Never used  ?Substance Use Topics  ? Alcohol use: Not Currently  ?  Comment: occ  ? Drug use: No  ? ? ?Allergies:  ?Allergies  ?Allergen Reactions  ? Milk-Related Compounds Dermatitis  ?  Breaks face out ?*can have 2% milk*  ? Mushroom Extract Complex Anaphylaxis  ? Penicillins Anaphylaxis  ?  Has patient had a PCN reaction causing immediate rash, facial/tongue/throat swelling, SOB or lightheadedness with hypotension: Yes ?Has patient had a PCN reaction causing severe rash involving mucus membranes or skin necrosis:  No ?Has patient had a PCN reaction that required hospitalization No ?Has patient had a PCN reaction occurring within the last 10 years: Yes ?If all of the above answers are "NO", then may proceed with Cephalosporin use. ?  ? Cheese Other (See Comments)  ?  Breaks face out  ? Latex Itching and Swelling  ? Sulfa Antibiotics Swelling  ? Vicodin [Hydrocodone-Acetaminophen] Hives  ? ? ?Medications Prior to Admission  ?Medication Sig Dispense Refill Last Dose  ? acetaminophen (TYLENOL) 500 MG tablet Take 1,000 mg by mouth every 6 (six) hours as needed for headache.   05/11/2021 at 0130  ? ferrous sulfate 325 (65 FE) MG tablet Take 1 tablet (325 mg total) by mouth 2 (two) times daily with a meal. 60 tablet 5 05/12/2021 at 0730  ? lamoTRIgine (LAMICTAL) 200 MG tablet Take 0.5 tablets (100 mg total) by mouth 2 (two) times daily. 60 tablet 11 05/12/2021 at 0730  ? levETIRAcetam (KEPPRA) 750 MG tablet Take 2 tablets (1,500 mg total) by mouth 2 (two) times daily. 120 tablet 11 05/12/2021 at 0730  ? metFORMIN  (GLUCOPHAGE) 500 MG tablet Take 1 tablet (500 mg total) by mouth 2 (two) times daily with a meal. 60 tablet 5 05/12/2021 at 0730  ? ondansetron (ZOFRAN ODT) 4 MG disintegrating tablet Take 1 tablet (4 mg total) by mouth every 8 (eight) hours as needed for nausea or vomiting. 12 tablet 2 05/12/2021 at 0730  ? pantoprazole (PROTONIX) 40 MG tablet Take 1 tablet (40 mg total) by mouth daily. 30 tablet 4 05/12/2021 at 0730  ? Prenatal Vit-Fe Fumarate-FA (PREPLUS) 27-1 MG TABS Take 1 tablet by mouth daily. 30 tablet 13 05/12/2021  ? Accu-Chek Softclix Lancets lancets 1 each by Other route 4 (four) times daily. Use as instructed 100 each 12   ? albuterol (PROVENTIL HFA;VENTOLIN HFA) 108 (90 Base) MCG/ACT inhaler Inhale 2 puffs into the lungs every 6 (six) hours as needed for wheezing or shortness of breath. (Patient not taking: Reported on 05/09/2021) 1 Inhaler 0   ? Blood Glucose Monitoring Suppl (ACCU-CHEK GUIDE) w/Device KIT 1 Device by Does not apply route in the morning, at noon, in the evening, and at bedtime. 1 kit 0   ? Blood Pressure Monitoring (BLOOD PRESSURE CUFF) MISC 1 Device by Does not apply route once a week. 1 each 0   ? Blood Pressure Monitoring (BLOOD PRESSURE KIT) DEVI 1 Device by Does not apply route once a week. 1 each 0   ? famotidine (PEPCID) 20 MG tablet Take 1 tablet (20 mg total) by mouth 2 (two) times daily. (Patient not taking: Reported on 04/13/2021) 30 tablet 3   ? glucose blood (ACCU-CHEK GUIDE) test strip Use as instructed 100 each 12   ? polyethylene glycol (MIRALAX / GLYCOLAX) 17 g packet Take 17 g by mouth 2 (two) times daily. (Patient not taking: Reported on 05/09/2021) 24 each 3   ? Prenatal Vit-Fe Fumarate-FA (PREPLUS) 27-1 MG TABS Take 1 tablet by mouth daily. (Patient not taking: Reported on 05/09/2021) 30 tablet 13   ? ? ?Review of Systems  ?Constitutional: Negative.  Negative for fatigue and fever.  ?HENT: Negative.    ?Respiratory: Negative.  Negative for shortness of breath.   ?Cardiovascular:  Negative.  Negative for chest pain.  ?Gastrointestinal:  Positive for nausea and vomiting. Negative for abdominal pain, constipation and diarrhea.  ?Genitourinary: Negative.  Negative for dysuria, vaginal bleeding and vaginal discharge.  ?Neurological:  Positive for headaches. Negative for  dizziness.  ?Physical Exam  ? ?Blood pressure 112/64, pulse (!) 111, temperature 98.5 ?F (36.9 ?C), temperature source Oral, resp. rate 19, height _0  (1.6 m), weight (!) 149.6 kg, last menstrual period 10/02/2020, SpO2 97 %, unknown if currently breastfeeding. ? ?Physical Exam ?Vitals and nursing note reviewed.  ?Constitutional:   ?   General: She is not in acute distress. ?   Appearance: She is well-developed.  ?HENT:  ?   Head: Normocephalic.  ?Eyes:  ?   Pupils: Pupils are equal, round, and reactive to light.  ?Cardiovascular:  ?   Rate and Rhythm: Normal rate and regular rhythm.  ?   Heart sounds: Normal heart sounds.  ?Pulmonary:  ?   Effort: Pulmonary effort is normal. No respiratory distress.  ?   Breath sounds: Normal breath sounds.  ?Abdominal:  ?   General: Bowel sounds are normal. There is no distension.  ?   Palpations: Abdomen is soft.  ?   Tenderness: There is no abdominal tenderness.  ?Skin: ?   General: Skin is warm and dry.  ?Neurological:  ?   Mental Status: She is alert and oriented to person, place, and time.  ?Psychiatric:     ?   Mood and Affect: Mood normal.     ?   Behavior: Behavior normal.     ?   Thought Content: Thought content normal.     ?   Judgment: Judgment normal.  ? ?Fetal Tracing: ? ?Baseline: 130 ?Variability: moderate ?Accels: 15x15 ?Decels: none ? ?Toco: none ? ? ?MAU Course  ?Procedures ?Results for orders placed or performed during the hospital encounter of 05/12/21 (from the past 24 hour(s))  ?Urinalysis, Routine w reflex microscopic Urine, Clean Catch     Status: Abnormal  ? Collection Time: 05/12/21 10:13 AM  ?Result Value Ref Range  ? Color, Urine YELLOW YELLOW  ? APPearance HAZY (A)  CLEAR  ? Specific Gravity, Urine 1.024 1.005 - 1.030  ? pH 6.0 5.0 - 8.0  ? Glucose, UA NEGATIVE NEGATIVE mg/dL  ? Hgb urine dipstick NEGATIVE NEGATIVE  ? Bilirubin Urine NEGATIVE NEGATIVE  ? Ketones, ur

## 2021-05-12 NOTE — Progress Notes (Signed)
Pt needing refill on Zofran. ?Rx approved to send by Dr Elgie Congo.  ?LVM making pt aware of Rx.  ?

## 2021-05-12 NOTE — MAU Note (Signed)
Latoya Stark is a 26 y.o. at 79w5dhere in MAU reporting: decreased FM and N/V.  Reports unable to keep anything down for a few days.  Also states no FM since 0100 this morning.  Denies VB or LOF. ? ?Onset of complaint: N/V few days & 0100 decreased FM. ?Pain score: 5/10 abdomen aching ?Vitals:  ? 05/12/21 0945  ?BP: 112/64  ?Pulse: (!) 111  ?Resp: 19  ?Temp: 98.5 ?F (36.9 ?C)  ?SpO2: 97%  ?   ?FHT: 147 bpm ?Lab orders placed from triage:   UA ?

## 2021-05-13 ENCOUNTER — Encounter: Payer: Self-pay | Admitting: Obstetrics

## 2021-05-18 ENCOUNTER — Encounter: Payer: Self-pay | Admitting: Obstetrics and Gynecology

## 2021-05-18 ENCOUNTER — Observation Stay
Admission: EM | Admit: 2021-05-18 | Discharge: 2021-05-18 | Disposition: A | Discharge status: Home / Self Care | Payer: Medicaid Other | Attending: Obstetrics and Gynecology | Admitting: Obstetrics and Gynecology

## 2021-05-18 ENCOUNTER — Other Ambulatory Visit: Payer: Self-pay

## 2021-05-18 DIAGNOSIS — Z91040 Latex allergy status: Secondary | ICD-10-CM | POA: Diagnosis not present

## 2021-05-18 DIAGNOSIS — O99353 Diseases of the nervous system complicating pregnancy, third trimester: Secondary | ICD-10-CM | POA: Diagnosis present

## 2021-05-18 DIAGNOSIS — G40909 Epilepsy, unspecified, not intractable, without status epilepticus: Secondary | ICD-10-CM | POA: Insufficient documentation

## 2021-05-18 DIAGNOSIS — O24415 Gestational diabetes mellitus in pregnancy, controlled by oral hypoglycemic drugs: Secondary | ICD-10-CM | POA: Insufficient documentation

## 2021-05-18 DIAGNOSIS — O99213 Obesity complicating pregnancy, third trimester: Secondary | ICD-10-CM | POA: Diagnosis not present

## 2021-05-18 DIAGNOSIS — Z3A32 32 weeks gestation of pregnancy: Secondary | ICD-10-CM | POA: Insufficient documentation

## 2021-05-18 DIAGNOSIS — Z6841 Body Mass Index (BMI) 40.0 and over, adult: Secondary | ICD-10-CM | POA: Diagnosis not present

## 2021-05-18 DIAGNOSIS — Z79899 Other long term (current) drug therapy: Secondary | ICD-10-CM | POA: Insufficient documentation

## 2021-05-18 DIAGNOSIS — E669 Obesity, unspecified: Secondary | ICD-10-CM | POA: Diagnosis not present

## 2021-05-18 DIAGNOSIS — Z7984 Long term (current) use of oral hypoglycemic drugs: Secondary | ICD-10-CM | POA: Diagnosis not present

## 2021-05-18 DIAGNOSIS — J45909 Unspecified asthma, uncomplicated: Secondary | ICD-10-CM | POA: Insufficient documentation

## 2021-05-18 DIAGNOSIS — O0990 Supervision of high risk pregnancy, unspecified, unspecified trimester: Secondary | ICD-10-CM

## 2021-05-18 DIAGNOSIS — O99513 Diseases of the respiratory system complicating pregnancy, third trimester: Secondary | ICD-10-CM | POA: Insufficient documentation

## 2021-05-18 LAB — URINE DRUG SCREEN, QUALITATIVE (ARMC ONLY)
Amphetamines, Ur Screen: NOT DETECTED
Barbiturates, Ur Screen: POSITIVE — AB
Benzodiazepine, Ur Scrn: NOT DETECTED
Cannabinoid 50 Ng, Ur ~~LOC~~: NOT DETECTED
Cocaine Metabolite,Ur ~~LOC~~: NOT DETECTED
MDMA (Ecstasy)Ur Screen: NOT DETECTED
Methadone Scn, Ur: NOT DETECTED
Opiate, Ur Screen: NOT DETECTED
Phencyclidine (PCP) Ur S: NOT DETECTED
Tricyclic, Ur Screen: NOT DETECTED

## 2021-05-18 LAB — CBC
HCT: 32 % — ABNORMAL LOW (ref 36.0–46.0)
Hemoglobin: 10.6 g/dL — ABNORMAL LOW (ref 12.0–15.0)
MCH: 28.6 pg (ref 26.0–34.0)
MCHC: 33.1 g/dL (ref 30.0–36.0)
MCV: 86.3 fL (ref 80.0–100.0)
Platelets: 262 10*3/uL (ref 150–400)
RBC: 3.71 MIL/uL — ABNORMAL LOW (ref 3.87–5.11)
RDW: 13.5 % (ref 11.5–15.5)
WBC: 5.5 10*3/uL (ref 4.0–10.5)
nRBC: 0 % (ref 0.0–0.2)

## 2021-05-18 LAB — COMPREHENSIVE METABOLIC PANEL
ALT: 23 U/L (ref 0–44)
AST: 22 U/L (ref 15–41)
Albumin: 2.9 g/dL — ABNORMAL LOW (ref 3.5–5.0)
Alkaline Phosphatase: 98 U/L (ref 38–126)
Anion gap: 5 (ref 5–15)
BUN: 6 mg/dL (ref 6–20)
CO2: 22 mmol/L (ref 22–32)
Calcium: 8.7 mg/dL — ABNORMAL LOW (ref 8.9–10.3)
Chloride: 109 mmol/L (ref 98–111)
Creatinine, Ser: 0.48 mg/dL (ref 0.44–1.00)
GFR, Estimated: >60 mL/min (ref >60)
Glucose, Bld: 87 mg/dL (ref 70–99)
Potassium: 4 mmol/L (ref 3.5–5.1)
Sodium: 136 mmol/L (ref 135–145)
Total Bilirubin: 0.2 mg/dL — ABNORMAL LOW (ref 0.3–1.2)
Total Protein: 6.7 g/dL (ref 6.5–8.1)

## 2021-05-18 LAB — PROTEIN / CREATININE RATIO, URINE
Creatinine, Urine: 52 mg/dL
Total Protein, Urine: <6 mg/dL

## 2021-05-18 LAB — MAGNESIUM: Magnesium: 1.9 mg/dL (ref 1.7–2.4)

## 2021-05-18 MED ORDER — LAMOTRIGINE 25 MG PO TABS
25.0000 mg | ORAL_TABLET | Freq: Two times a day (BID) | ORAL | 0 refills | Status: DC
Start: 2021-05-18 — End: 2021-05-24

## 2021-05-18 MED ORDER — LAMOTRIGINE 25 MG PO TABS
25.0000 mg | ORAL_TABLET | Freq: Two times a day (BID) | ORAL | Status: DC
  Filled 2021-05-18: qty 1

## 2021-05-18 MED ORDER — LAMOTRIGINE 100 MG PO TABS
200.0000 mg | ORAL_TABLET | Freq: Two times a day (BID) | ORAL | Status: DC
  Filled 2021-05-18: qty 2

## 2021-05-18 MED ORDER — CALCIUM CARBONATE ANTACID 500 MG PO CHEW: 2.0000 | CHEWABLE_TABLET | ORAL | Status: DC | PRN

## 2021-05-18 MED ORDER — LAMOTRIGINE 25 MG PO TABS
25.0000 mg | ORAL_TABLET | Freq: Two times a day (BID) | ORAL | 0 refills | Status: DC
Start: 2021-05-18 — End: 2021-05-18

## 2021-05-18 MED ORDER — ACETAMINOPHEN 500 MG PO TABS: 1000.0000 mg | ORAL_TABLET | Freq: Four times a day (QID) | ORAL | Status: DC | PRN

## 2021-05-18 NOTE — OB Triage Note (Addendum)
Pt arrived via EMS after having a seizure at home around 1115. Pt is A&O x 4. Pt states she feels "weak but ok and a little sleepy". Pt reports being on the bed at the time of the seizure and claims that she did not fall. Pt blood sugar and  VSS. Pt denies bleeding or LOF. Pt reports having a HA but also reports she has chronic migraines and states her head was hurting before the seizure. She states her head hurts everyday.Pt reports positive fetal movement. Pt recieves her care in [place] but [facility] was the nearest hospital for EMS. Pt reports having gestational diabetes and recently was started on metformin. Pt also has a history of seizures and states her keppra dose was increased about 2 months ago. Fetal monitoring applied at this time. Will continue to monitor.  ?

## 2021-05-18 NOTE — Consult Note (Addendum)
Neurology Consultation Reason for Consult: Breakthrough seizure Requesting Physician: Drinda Butts  CC: Breakthrough seizure  History is obtained from: Patient and chart review, partner via phone  HPI: Latoya Stark is a 26 y.o. female with a past medical history of being 31w3dpregnant, focal seizures with secondary generalization, gestational diabetes, headaches, ADHD, anxiety/depression, obesity (BMI 57.68)  Patient and partner report that the patient has not had any seizures since her last visit with Dr. SFelecia Shellingon 03/15/2021 until the event that brought her in today.  The patient recalls that she was talking with a family member on the phone while her partner was asleep.  She suddenly felt very tired and wanted to sleep, and subsequently her partner awoke to her having a GTC which lasted for approximately 13 minutes.  She reports her typical post seizure difficulty using her left leg at this time, otherwise she is back to her baseline.  At her last neurology appointment her lamotrigine was increased from 100 mg twice daily to 200 mg twice daily, and she was continued on Keppra 1500 mg twice daily.  She reports she has tolerated the medication changes well, and initially reported no changes in her medications.  But on further questioning, she notes that her 27year-old threw away her medication bottle of lamotrigine shortly after it was refilled and therefore she was out of it completely for 3 weeks.  She was able to obtain a supply from a family member who is on the same medication and restarted it at her dose of 200 mg twice a day about 1 week ago.  She has been tolerating this well without any skin changes or rashes.  She does feel like she is generally had some worsening of her memory recently and increased headaches, but reports her sleep is stably poor (sleeps typically 3 to 4 hours a night and feels groggy if she sleeps a full 8 hours).  She also has increased light sensitivity and headaches which  has been stable for a few months now.  Regarding infectious symptoms she had some diarrhea couple of days ago but this has resolved.  She and her partner have no other concerns or complaints on full review of systems.  Seizure history as documented by Dr. SFelecia Shelling "She has had seizures since age 26    In 05/2018,  she was sitting in the car talking to her boyfriend and then seized (2-3 minutes GTC) and was taken to the ED,    She reports being told that she was slurring her words and not making sense for a lttle bit before the seizure.  She also complained of a headache.    She went to the ED and was placed on Keppra.   [facility]   She had CT head and cervical spine and had xrays of right shoulder and lumbar spine.     She had been on Keppra 1500 mg bid.     Her level was good at 23.7 (10-40 is range)" "She brother reports she has a seizure with shaking every 2-4 weeks.   The shaking lasts one minute and she is unresponsive afterwards for several minutes.    She takes Keppra 1500 mg twice a day and lamotigine 100 mg po bid.    She had een on topiramate and it was d/c early in pregnancy (as lamotrigine added)."    ROS: All other review of systems was negative except as noted in the  HPI.   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   Headache    Obesity    Seizures ([place])    "when I get too hot"   Trichomonas infection    UTI (lower urinary tract infection)    Past Surgical History:  Procedure Laterality Date   ADENOIDECTOMY     DILATION AND EVACUATION N/A 08/09/2013   Procedure: DILATATION AND EVACUATION;  Surgeon: Frederico Hamman, MD;  Location: [facility];  Service: Gynecology;  Laterality: N/A;   ORTHOPEDIC SURGERY  Left    left hand   TONSILLECTOMY     .med  Family History  Problem Relation Age of Onset   Heart disease Mother    Cancer Mother        Bone cancer   Seizures Mother    Stroke Mother    Sickle cell anemia Mother    Hypertension Father    Seizures Maternal Aunt    Diabetes Maternal Grandmother    Colon cancer Maternal Grandmother    Hypertension Maternal Grandmother    Clotting disorder Maternal Grandmother    Breast cancer Maternal Grandmother    Stroke Maternal Grandmother    Diabetes Paternal Grandmother    Current Outpatient Medications  Medication Instructions   Accu-Chek Softclix Lancets lancets 1 each, Other, 4 times daily, Use as instructed   acetaminophen (TYLENOL) 1,000 mg, Oral, Every 6 hours PRN   albuterol (PROVENTIL HFA;VENTOLIN HFA) 108 (90 Base) MCG/ACT inhaler 2 puffs, Inhalation, Every 6 hours PRN   Blood Glucose Monitoring Suppl (ACCU-CHEK GUIDE) w/Device KIT 1 Device, Does not apply, 4 times daily   Blood Pressure Monitoring (BLOOD PRESSURE CUFF) MISC 1 Device, Does not apply, Weekly   Blood Pressure Monitoring (BLOOD PRESSURE KIT) DEVI 1 Device, Does not apply, Weekly   butalbital-acetaminophen-caffeine (FIORICET) 50-325-40 MG tablet 2 tablets, Oral, Every 6 hours PRN   ferrous sulfate 325 mg, Oral, 2 times daily with meals   glucose blood (ACCU-CHEK GUIDE) test strip Use as instructed   lamoTRIgine (LAMICTAL) 100 mg, Oral, 2 times daily   levETIRAcetam (KEPPRA) 1,500 mg, Oral, 2 times daily   metFORMIN (GLUCOPHAGE) 500 mg, Oral, 2 times daily with meals   metoCLOPramide (REGLAN) 10 mg, Oral, Every 6 hours PRN   ondansetron (ZOFRAN ODT) 4 mg, Oral, Every 8 hours PRN   pantoprazole (PROTONIX) 40 mg, Oral, 2 times daily   polyethylene glycol (MIRALAX / GLYCOLAX) 17 g, Oral, 2 times daily   Prenatal Vit-Fe Fumarate-FA (PREPLUS) 27-1 MG TABS 1 tablet, Oral, Daily   Prenatal Vit-Fe Fumarate-FA (PREPLUS) 27-1 MG TABS 1 tablet, Oral, Daily   promethazine  (PHENERGAN) 25 mg, Oral, Every 6 hours PRN     Social History:  reports that she has never smoked. She has never used smokeless tobacco. She reports that she does not currently use alcohol. She reports that she does not use drugs.   Exam: Current vital signs: BP 117/81 (BP Location: Right Arm)   Pulse (!) 102   Temp 97.9 F (36.6 C) (Oral)   Resp 20   Ht '5\' 3"'  (1.6 m)   Wt (!) 147.7 kg   LMP 10/02/2020 (Approximate)   SpO2 97%   BMI 57.68 kg/m  Vital  signs in last 24 hours: Temp:  [97.9 F (36.6 C)] 97.9 F (36.6 C) (05/10 1202) Pulse Rate:  [102] 102 (05/10 1202) Resp:  [20] 20 (05/10 1202) BP: (117)/(81) 117/81 (05/10 1202) SpO2:  [97 %] 97 % (05/10 1202) Weight:  [147.7 kg] 147.7 kg (05/10 1202)   Physical Exam  Constitutional: Appears well-developed and well-nourished.  Psych: Affect appropriate to situation, calm and cooperative Eyes: No scleral injection HENT: No oropharyngeal obstruction.  Mallampati score 4.  No nuchal rigidity MSK: no joint deformities.  Cardiovascular:  Perfusing extremities well Respiratory: Effort normal, non-labored breathing GI: Soft. There is no tenderness.  Skin: Warm dry and intact visible skin  Neuro: Mental Status: Patient is awake, alert, oriented to person, place, month, year, and situation. Patient is able to give a clear and coherent history. No signs of aphasia or neglect, some slight left right confusion when she is describing which side she is typically weak on after seizure Cranial Nerves: II: Visual Fields are full. Pupils are equal, round, and reactive to light.  Mild photophobia III,IV, VI: EOMI without ptosis or diploplia.  V: Facial sensation is symmetric to light touch VII: Facial movement is symmetric.  VIII: hearing is intact to voice X: Uvula cannot be visualized due to anatomy XI: Shoulder shrug is symmetric. XII: tongue is midline without atrophy or fasciculations.  Motor: Tone is normal. Bulk is normal.  5/5 strength was present in all four extremities, with some slight giveaway weakness in the left hip flexion Sensory: Sensation is symmetric to light touch and temperature in the arms and legs, other than some chronic left upper extremity sensory loss from a prior injury Deep Tendon Reflexes: 2+ on the right knee, difficult to elicit on the left due to relaxation Plantars: Toes are mute bilaterally Cerebellar: FNF intact bilaterally, heel-to-shin somewhat limited on the left leg secondary to pain Gait:  Deferred  I have reviewed labs in epic and the results pertinent to this consultation are:  Last metabolic panel Lab Results  Component Value Date   GLUCOSE 87 05/18/2021   NA 136 05/18/2021   K 4.0 05/18/2021   CL 109 05/18/2021   CO2 22 05/18/2021   BUN 6 05/18/2021   CREATININE 0.48 05/18/2021   GFRNONAA >60 05/18/2021   CALCIUM 8.7 (L) 05/18/2021   PROT 6.7 05/18/2021   ALBUMIN 2.9 (L) 05/18/2021   LABGLOB 2.7 12/30/2020   AGRATIO 1.5 12/30/2020   BILITOT 0.2 (L) 05/18/2021   ALKPHOS 98 05/18/2021   AST 22 05/18/2021   ALT 23 05/18/2021   ANIONGAP 5 05/18/2021    CBC: Recent Labs  Lab 05/18/21 1240  WBC 5.5  HGB 10.6*  HCT 32.0*  MCV 86.3  PLT 262    Coagulation Studies: No results for input(s): LABPROT, INR in the last 72 hours.   UA negative for infection  I have reviewed the images obtained: No CNS imaging this hospitalization   Impression: Breakthrough seizure.  While she has had some recent medication noncompliance, being on her full dose for the last week I suspect her lamotrigine level should be at steady state at this time.  However lamotrigine clearance can continue to increase in pregnancy.  Since she is tolerating her current dose well, I will increase her dose slightly (which she reports was Dr. Garth Bigness plan in case of further seizures).  I will also obtain drug levels to confirm adherence, which can be followed up by her outpatient  neurologist  Recommendations: -UDS, magnesium level  to complete work-up for contributing factors -Keppra level and lamotrigine level to assess for compliance as well as increased lamotrigine clearance in the setting of third trimester -Continue Keppra 1500 mg twice daily -Increase lamotrigine to 225 mg twice daily -Close outpatient follow-up with Dr. Maretta Los MD-PhD [facility] [phone] [facility] coverage for [facility] is from 8 AM to 4 AM in-house and 4 PM to 8 PM by telephone/video. 8 PM to 8 AM emergent questions or overnight urgent questions should be addressed to Teleneurology On-call or [facility] neurohospitalist; contact information can be found on AMION  Thank you for involving [place] in the care of this patient

## 2021-05-18 NOTE — Discharge Summary (Signed)
RN reviewed discharge instructions with patient. Gave pt opportunity for questions. All questions answered at this time. Pt verbalized understanding. Pt discharged home with her mother-in-law. ?

## 2021-05-18 NOTE — Discharge Summary (Signed)
Latoya Stark is a 26 y.o. female. She is at 52w4dgestation. Patient's last menstrual period was 10/02/2020 (approximate). ?Estimated Date of Delivery: 07/09/21 ? ?[facility] site:  [facility] ? ?Chief complaint: post seizure at home  ? ?HPI: Latoya Stark to L&D with complaints of having a seizure at home.  She arrived via EMS in stable condition.  States she has a history of seizures and is currently taking Keppra 15064mBID and Lamictal 20059mID.  She reports having daily headaches and had a headache this morning before her seizure started.  Her neurologist, [facility], increased her Lamictal from 100m52mD to 200mg19m and kept the [place]mg 66m  She was out of her current dose of Lamictal for about 2 week67 (2 year27old threw it away) before she was able to restart it about a week ago.   ? ?Factors complicating pregnancy: ?Morbid obesity during pregnancy  ?Seizure disorder in pregnancy - last seizure this morning  ?IUGR  ?GDM - recently started taking Metformin  ? ?S: Resting comfortably. no CTX, no VB.no LOF,  Active fetal movement.  ? 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   ? ?Past Surgical Hx:  has a past surgical history that includes orthopedic surgery (Left); Adenoidectomy; Dilation and evacuation (N/A, 08/09/2013); and Tonsillectomy.  ? ?Allergies  ?Allergen Reactions  ? Hydrocodone Hives  ? Milk-Related Compounds Dermatitis  ?  Breaks face out ?*can have 2% milk*  ? Mushroom Extract Complex Anaphylaxis  ? Penicillins Anaphylaxis  ?  Has patient had a PCN reaction causing immediate rash,  facial/tongue/throat swelling, SOB or lightheadedness with hypotension: Yes ?Has patient had a PCN reaction causing severe rash involving mucus membranes or skin necrosis: No ?Has patient had a PCN reaction that required hospitalization No ?Has patient had a PCN reaction occurring within the last 10 years: Yes ?If all of the above answers are "NO", then may proceed with Cephalosporin use. ?  ? Cheese Other (See Comments)  ?  Breaks face out  ? Latex Itching and Swelling  ? Sulfa Antibiotics Swelling  ?  ? ?Prior to Admission medications   ?Medication Sig Start Date End Date Taking? Authorizing Provider  ?Accu-Chek Softclix Lancets lancets 1 each by Other route 4 (four) times daily. Use as instructed 04/06/21  Yes Latoya Stark?acetaminophen (TYLENOL) 500 MG tablet Take 1,000 mg by mouth every 6 (six) hours as needed for headache.   Yes Latoya Stark  ?Blood Glucose Monitoring Suppl (ACCU-CHEK GUIDE) w/Device KIT 1 Device by Does not apply route in the morning, at noon, in the evening, and at bedtime. 04/06/21  Yes Latoya Stark?Blood Pressure Monitoring (BLOOD PRESSURE CUFF) MISC 1 Device by Does not apply route once a week. 12/25/18  Yes Latoya Stark?Blood Pressure Monitoring (BLOOD PRESSURE KIT) DEVI 1 Device by Does not apply route once a week. 12/13/20  Yes Latoya Stark?butalbital-acetaminophen-caffeine ([id number] [id number] Take 2 tablets by mouth every 6 (six) hours as needed for headache. 05/12/21 05/12/22 Yes Latoya Stark ?ferrous sulfate 325 (65 FE) MG tablet Take 1 tablet (325 mg total)  by mouth 2 (two) times daily with a meal. 04/16/19  Yes Latoya Bombard, Stark  ?glucose blood (ACCU-CHEK GUIDE) test strip Use as instructed 04/06/21  Yes Latoya Mode, Stark  ?lamoTRIgine (LAMICTAL) 200 MG tablet Take 200 mg by mouth 2 (two) times daily.   Yes Latoya Stark  ?levETIRAcetam (KEPPRA) 750 MG tablet Take 2 tablets (1,500 mg total) by mouth 2  (two) times daily. 06/22/20 06/17/21 Yes Sater, Nanine Means, Stark  ?metFORMIN (GLUCOPHAGE) 500 MG tablet Take 1 tablet (500 mg total) by mouth 2 (two) times daily with a meal. 05/09/21  Yes Constant, Peggy, Stark  ?metoCLOPramide (REGLAN) 10 MG tablet Take 1 tablet (10 mg total) by mouth every 6 (six) hours as needed for nausea. 05/12/21  Yes Latoya Stark, Latoya Stark  ?ondansetron (ZOFRAN ODT) 4 MG disintegrating tablet Take 1 tablet (4 mg total) by mouth every 8 (eight) hours as needed for nausea or vomiting. 05/12/21  Yes Latoya Stark  ?pantoprazole (PROTONIX) 40 MG tablet Take 1 tablet (40 mg total) by mouth 2 (two) times daily. 05/12/21 05/12/22 Yes Latoya Stark, Latoya Stark  ?Prenatal Vit-Fe Fumarate-FA (PREPLUS) 27-1 MG TABS Take 1 tablet by mouth daily. 03/17/21  Yes Latoya Stark  ?promethazine (PHENERGAN) 25 MG tablet Take 1 tablet (25 mg total) by mouth every 6 (six) hours as needed for nausea or vomiting. 05/12/21  Yes Latoya Stark, Latoya Stark  ?albuterol (PROVENTIL HFA;VENTOLIN HFA) 108 (90 Base) MCG/ACT inhaler Inhale 2 puffs into the lungs every 6 (six) hours as needed for wheezing or shortness of breath. ?Patient not taking: Reported on 05/09/2021 08/20/17   Latoya Stark  ?lamoTRIgine (LAMICTAL) 25 MG tablet Take 1 tablet (25 mg total) by mouth 2 (two) times daily. Take with 200 mg tablet for total dose of 265m two times daily. 05/18/21   Latoya Stark  ?polyethylene glycol (MIRALAX / GLYCOLAX) 17 g packet Take 17 g by mouth 2 (two) times daily. ?Patient not taking: Reported on 05/09/2021 11/11/18   Latoya Stark  ?Prenatal Vit-Fe Fumarate-FA (PREPLUS) 27-1 MG TABS Take 1 tablet by mouth daily. ?Patient not taking: Reported on 05/09/2021 12/13/20   Latoya Stark  ? ? ?Social History: She  reports that she has never smoked. She has never used smokeless tobacco. She reports that she does not currently use alcohol. She reports that she does not use drugs. ? ?Family History: family history includes  Breast cancer in her maternal grandmother; Cancer in her mother; Clotting disorder in her maternal grandmother; Colon cancer in her maternal grandmother; Diabetes in her maternal grandmother and paternal grandmother; Heart disease in her mother; Hypertension in her father and maternal grandmother; Seizures in her maternal aunt and mother; Sickle cell anemia in her mother; Stroke in her maternal grandmother and mother.  ? ?Review of Systems: A full review of systems was performed and negative except as noted in the HPI.   ? ?O: ? BP 117/81 (BP Location: Right Arm)   Pulse (!) 102   Temp 97.9 ?F (36.6 ?C) (Oral)   Resp 20   Ht '5\' 3"'  (1.6 m)   Wt (!) 147.7 kg   LMP 10/02/2020 (Approximate)   SpO2 97%   BMI 57.68 kg/m?  ?Results for orders placed or performed during the hospital encounter of 05/18/21 (from the past 48 hour(s))  ?Protein / creatinine ratio, urine  ? Collection Time: 05/18/21 12:32 PM  ?Result Value Ref Range  ?  Creatinine, Urine 52 mg/dL  ? Total Protein, Urine <6 mg/dL  ? Protein Creatinine Ratio        0.00 - 0.15 mg/mg[Cre]  ?Comprehensive metabolic panel  ? Collection Time: 05/18/21 12:40 PM  ?Result Value Ref Range  ? Sodium 136 135 - 145 mmol/L  ? Potassium 4.0 3.5 - 5.1 mmol/L  ? Chloride 109 98 - 111 mmol/L  ? CO2 22 22 - 32 mmol/L  ? Glucose, Bld 87 70 - 99 mg/dL  ? BUN 6 6 - 20 mg/dL  ? Creatinine, Ser 0.48 0.44 - 1.00 mg/dL  ? Calcium 8.7 (L) 8.9 - 10.3 mg/dL  ? Total Protein 6.7 6.5 - 8.1 g/dL  ? Albumin 2.9 (L) 3.5 - 5.0 g/dL  ? AST 22 15 - 41 U/L  ? ALT 23 0 - 44 U/L  ? Alkaline Phosphatase 98 38 - 126 U/L  ? Total Bilirubin 0.2 (L) 0.3 - 1.2 mg/dL  ? GFR, Estimated >60 >60 mL/min  ? Anion gap 5 5 - 15  ?CBC  ? Collection Time: 05/18/21 12:40 PM  ?Result Value Ref Range  ? WBC 5.5 4.0 - 10.5 K/uL  ? RBC 3.71 (L) 3.87 - 5.11 MIL/uL  ? Hemoglobin 10.6 (L) 12.0 - 15.0 g/dL  ? HCT 32.0 (L) 36.0 - 46.0 %  ? MCV 86.3 80.0 - 100.0 fL  ? MCH 28.6 26.0 - 34.0 pg  ? MCHC 33.1 30.0 - 36.0 g/dL   ? RDW 13.5 11.5 - 15.5 %  ? Platelets 262 150 - 400 K/uL  ? nRBC 0.0 0.0 - 0.2 %  ?Urine Drug Screen, Qualitative ([place] only)  ? Collection Time: 05/18/21  1:51 PM  ?Result Value Ref Range  ? Tricyclic, Ur Screen NONE DETECTE

## 2021-05-19 LAB — LEVETIRACETAM LEVEL: Levetiracetam Lvl: 10.2 ug/mL (ref 10.0–40.0)

## 2021-05-19 LAB — LAMOTRIGINE LEVEL: Lamotrigine Lvl: <1 ug/mL — ABNORMAL LOW (ref 2.0–20.0)

## 2021-05-23 ENCOUNTER — Other Ambulatory Visit: Payer: Self-pay | Admitting: Obstetrics

## 2021-05-23 ENCOUNTER — Encounter: Payer: Self-pay | Admitting: *Deleted

## 2021-05-23 ENCOUNTER — Ambulatory Visit: Payer: Medicaid Other | Attending: Obstetrics

## 2021-05-23 ENCOUNTER — Other Ambulatory Visit: Payer: Self-pay | Admitting: *Deleted

## 2021-05-23 ENCOUNTER — Ambulatory Visit: Payer: Medicaid Other | Admitting: *Deleted

## 2021-05-23 VITALS — BP 117/76 | HR 96

## 2021-05-23 DIAGNOSIS — O24415 Gestational diabetes mellitus in pregnancy, controlled by oral hypoglycemic drugs: Secondary | ICD-10-CM

## 2021-05-23 DIAGNOSIS — O24410 Gestational diabetes mellitus in pregnancy, diet controlled: Secondary | ICD-10-CM

## 2021-05-23 DIAGNOSIS — O0990 Supervision of high risk pregnancy, unspecified, unspecified trimester: Secondary | ICD-10-CM | POA: Diagnosis present

## 2021-05-23 DIAGNOSIS — Z6841 Body Mass Index (BMI) 40.0 and over, adult: Secondary | ICD-10-CM

## 2021-05-23 DIAGNOSIS — R638 Other symptoms and signs concerning food and fluid intake: Secondary | ICD-10-CM

## 2021-05-23 DIAGNOSIS — O99213 Obesity complicating pregnancy, third trimester: Secondary | ICD-10-CM

## 2021-05-23 DIAGNOSIS — G40909 Epilepsy, unspecified, not intractable, without status epilepticus: Secondary | ICD-10-CM

## 2021-05-23 DIAGNOSIS — O99350 Diseases of the nervous system complicating pregnancy, unspecified trimester: Secondary | ICD-10-CM | POA: Diagnosis not present

## 2021-05-23 DIAGNOSIS — O365930 Maternal care for other known or suspected poor fetal growth, third trimester, not applicable or unspecified: Secondary | ICD-10-CM

## 2021-05-23 DIAGNOSIS — Z3A33 33 weeks gestation of pregnancy: Secondary | ICD-10-CM

## 2021-05-23 DIAGNOSIS — O24419 Gestational diabetes mellitus in pregnancy, unspecified control: Secondary | ICD-10-CM

## 2021-05-24 ENCOUNTER — Ambulatory Visit (INDEPENDENT_AMBULATORY_CARE_PROVIDER_SITE_OTHER): Payer: Managed Care, Other (non HMO) | Admitting: Obstetrics & Gynecology

## 2021-05-24 VITALS — BP 122/83 | HR 103 | Wt 332.8 lb

## 2021-05-24 DIAGNOSIS — O99353 Diseases of the nervous system complicating pregnancy, third trimester: Secondary | ICD-10-CM

## 2021-05-24 DIAGNOSIS — G40909 Epilepsy, unspecified, not intractable, without status epilepticus: Secondary | ICD-10-CM

## 2021-05-24 DIAGNOSIS — O99210 Obesity complicating pregnancy, unspecified trimester: Secondary | ICD-10-CM

## 2021-05-24 DIAGNOSIS — Z3A33 33 weeks gestation of pregnancy: Secondary | ICD-10-CM

## 2021-05-24 DIAGNOSIS — O24415 Gestational diabetes mellitus in pregnancy, controlled by oral hypoglycemic drugs: Secondary | ICD-10-CM

## 2021-05-24 DIAGNOSIS — O0990 Supervision of high risk pregnancy, unspecified, unspecified trimester: Secondary | ICD-10-CM

## 2021-05-24 MED ORDER — LAMOTRIGINE 200 MG PO TABS: 200.0000 mg | ORAL_TABLET | Freq: Two times a day (BID) | ORAL | 5 refills | Status: DC

## 2021-05-24 MED ORDER — LEVETIRACETAM 750 MG PO TABS: 1500.0000 mg | ORAL_TABLET | Freq: Two times a day (BID) | ORAL | 11 refills | Status: DC

## 2021-05-24 MED ORDER — LAMOTRIGINE 25 MG PO TABS: 25.0000 mg | ORAL_TABLET | Freq: Two times a day (BID) | ORAL | 5 refills | Status: DC

## 2021-05-24 NOTE — Progress Notes (Signed)
? ?PRENATAL VISIT NOTE ? ?Subjective:  ?Latoya Stark is a 26 y.o. [phone] at 48w3dbeing seen today for ongoing prenatal care.  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 ? ?Patient reports no complaints.  Contractions: Irritability. Vag. Bleeding: None.  Movement: Present. Denies leaking of fluid.  ? ?The following portions of the patient's history were reviewed and updated as appropriate: allergies, current medications, past family history, past medical history, past social history, past surgical history and problem list.  ? ?Objective:  ? ?Vitals:  ? 05/24/21 1015  ?BP: 122/83  ?Pulse: (!) 103  ?Weight: (!) 332 lb 12.8 oz (151 kg)  ? ? ?Fetal Status: Fetal Heart Rate (bpm): 141   Movement: Present    ? ?General:  Alert, oriented and cooperative. Patient is in no acute distress.  ?Skin: Skin is warm and dry. No rash noted.   ?Cardiovascular: Normal heart rate noted  ?Respiratory: Normal respiratory effort, no problems with respiration noted  ?Abdomen: Soft, gravid, appropriate for gestational age.  Pain/Pressure: Present     ?Pelvic: Cervical exam deferred        ?Extremities: Normal range of motion.     ?Mental Status: Normal mood and affect. Normal behavior. Normal judgment and thought content.  ? ?Imaging: ?[place]FM FETAL BPP WO NON  STRESS ? ?Result Date: 05/23/2021 ?----------------------------------------------------------------------  OBSTETRICS REPORT                       (Signed Final 05/23/2021 11:48 am) ---------------------------------------------------------------------- Patient Info  ID #:       [id number]                         D.O.B.:  1997(25 yrs)  Name:       DDECHELLE Stark                Visit Date: 05/23/2021 11:11 am ---------------------------------------------------------------------- Performed By  Attending:        RTama HighMD        Ref. Address:     [address]                                                            [place] Performed By:     FBenson [place]         [facility]                                                             [place] [facility]                                                             [facility]  Referred By:      [facility] ---------------------------------------------------------------------- Orders  #  Description                           Code        Ordered By  1  [place] MFM OB FOLLOW UP                   [phone]    Peterson Ao  2  [place] MFM FETAL BPP WO NON               [id number]    YU FANG     STRESS ----------------------------------------------------------------------  #  Order #                     Accession #                Episode #  1  [id number]                   [id number]                 [id number]  2  [id number]                   [id number]                 [id number] ---------------------------------------------------------------------- Indications  Gestational diabetes in pregnancy,             O24.415  controlled by oral hypoglycemic drugs  Maternal care for known or suspected poor      O36.5931  fetal growth, third trimester, fetus 1 IUGR  (Resolved)  Obesity complicating  pregnancy, third          O99.213  trimester (BMI 56.57)  Seizure disorder (lamictal)                    O99.350 G40.909  [redacted] weeks gestation of pregnancy                Z3A.33  LR NIPS ---------------------------------------------------------------------- Fetal Evaluation  Num Of Fetuses:         1  Fetal Heart Rate(bpm):  141  Cardiac Activity:  Observed  Presentation:           Cephalic  Placenta:               Posterior  P. Cord Insertion:      Previously Visualized  Amniotic Fluid  AFI FV:      Within normal limits  AFI Sum(cm)     %Tile       Largest Pocket(cm)  15.2            54          4.2  RUQ(cm)       RLQ(cm)       LUQ(cm)        LLQ(cm)  3             4.1           4.2            3.9 ---------------------------------------------------------------------- Biometry  BPD:      88.7  mm     G. Age:  35w 6d         97  %    CI:        78.13   %    70 - 86                                                          FL/HC:      20.6   %    19.9 - 21.5  HC:      317.5  mm     G. Age:  35w 5d         76  %    HC/AC:      1.09        0.96 - 1.11  AC:      292.1  mm     G. Age:  33w 1d         49  %    FL/BPD:     73.7   %    71 - 87  FL:       65.4  mm     G. Age:  33w 5d         51  %    FL/AC:      22.4   %    20 - 24  Est. FW:    2290  gm      5 lb 1 oz     59  % ---------------------------------------------------------------------- OB History  Gravidity:    7         Term:   1        Prem:   0        SAB:   5  TOP:          0       Ectopic:  0        Living: 1 ---------------------------------------------------------------------- Gestational Age  LMP:           33w 2d        Date:  10/02/20                  EDD:   07/09/21  U/S Today:     34w 4d  EDD:   06/30/21  Best:          33w 2d     Det. By:  LMP  (10/02/20)          EDD:   07/09/21 ---------------------------------------------------------------------- Anatomy  Cranium:               Appears normal         Aortic Arch:             Previously seen  Cavum:                 Previously seen        Ductal Arch:            Previously seen  Ventricles:            Previously seen        Diaphragm:              Previously seen  Choroid Plexus:        Previously seen        Stomach:                Appears normal, left                                                                        sided  Cerebellum:            Previously seen        Abdomen:                Previously seen  Posterior Fossa:       Previously seen        Abdominal Wall:         Previously seen  Nuchal Fold:           Previously seen        Cord Vessels:           Previously seen  Face:                  Orbits and profile     Kidneys:                Appear normal                         previously seen  Lips:                  Previously seen        Bladder:                Appears normal  Thoracic:              Appears normal         Spine:                  Previously seen  Heart:                 Previously seen        Upper Extremities:      Previously seen  RVOT:                  Previously seen  Lower Extremities:      Previously seen  LVOT:                  Previously seen  Other:  Nasal bone, lenses, maxilla, mandible and falx prev visualized          Heels/feet and open hands/5th digits prev visualized. 3VV and 3VTV          prev visualized. ---------------------------------------------------------------------- Cervix Uterus Adnexa  Adnexa  No abnormality visualized. ---------------------------------------------------------------------- Impression  Fetal growth is appropriate for gestational age .Amniotic fluid  is normal and good fetal activity is seen. Antenatal testing is  reassuring. BPP 8/8.  Gestational diabetes. Patient takes metformin for control . ---------------------------------------------------------------------- Recommendations  -Weekly BPP from next visit till delivery. ----------------------------------------------------------------------                  Tama High, MD Electronically Signed Final Report   05/23/2021 11:48 am ---------------------------------------------------------------------- ? ?[place] MFM OB FOLLOW UP ? ?Result Date: 05/23/2021 ?-------

## 2021-05-24 NOTE — Patient Instructions (Signed)
Return to office for any scheduled appointments. Call the office or go to the MAU at [facility] if: You begin to have strong, frequent contractions Your water breaks.  Sometimes it is a big gush of fluid, sometimes it is just a trickle that keeps getting your underwear wet or running down your legs You have vaginal bleeding.  It is normal to have a small amount of spotting if your cervix was checked.  You do not feel your baby moving like normal.  If you do not, get something to eat and drink and lay down and focus on feeling your baby move.   If your baby is still not moving like normal, you should call the office or go to MAU. Any other obstetric concerns.  

## 2021-05-26 ENCOUNTER — Inpatient Hospital Stay (HOSPITAL_COMMUNITY): Payer: Medicaid Other

## 2021-05-26 ENCOUNTER — Inpatient Hospital Stay (HOSPITAL_BASED_OUTPATIENT_CLINIC_OR_DEPARTMENT_OTHER): Payer: Medicaid Other

## 2021-05-26 ENCOUNTER — Inpatient Hospital Stay (HOSPITAL_COMMUNITY)
Admission: AD | Admit: 2021-05-26 | Discharge: 2021-05-26 | Disposition: A | Discharge status: Home / Self Care | Payer: Medicaid Other | Attending: Obstetrics & Gynecology | Admitting: Obstetrics & Gynecology

## 2021-05-26 ENCOUNTER — Encounter (HOSPITAL_COMMUNITY): Payer: Self-pay | Admitting: Obstetrics & Gynecology

## 2021-05-26 ENCOUNTER — Other Ambulatory Visit: Payer: Self-pay | Admitting: *Deleted

## 2021-05-26 DIAGNOSIS — Z3A33 33 weeks gestation of pregnancy: Secondary | ICD-10-CM

## 2021-05-26 DIAGNOSIS — J4521 Mild intermittent asthma with (acute) exacerbation: Secondary | ICD-10-CM

## 2021-05-26 DIAGNOSIS — J45901 Unspecified asthma with (acute) exacerbation: Secondary | ICD-10-CM | POA: Diagnosis not present

## 2021-05-26 DIAGNOSIS — O99213 Obesity complicating pregnancy, third trimester: Secondary | ICD-10-CM | POA: Insufficient documentation

## 2021-05-26 DIAGNOSIS — O0990 Supervision of high risk pregnancy, unspecified, unspecified trimester: Secondary | ICD-10-CM

## 2021-05-26 DIAGNOSIS — R0602 Shortness of breath: Secondary | ICD-10-CM

## 2021-05-26 DIAGNOSIS — R0981 Nasal congestion: Secondary | ICD-10-CM | POA: Diagnosis not present

## 2021-05-26 DIAGNOSIS — O26893 Other specified pregnancy related conditions, third trimester: Secondary | ICD-10-CM | POA: Insufficient documentation

## 2021-05-26 DIAGNOSIS — O368190 Decreased fetal movements, unspecified trimester, not applicable or unspecified: Secondary | ICD-10-CM

## 2021-05-26 DIAGNOSIS — O1493 Unspecified pre-eclampsia, third trimester: Secondary | ICD-10-CM | POA: Diagnosis not present

## 2021-05-26 LAB — CBC
HCT: 32.8 % — ABNORMAL LOW (ref 36.0–46.0)
Hemoglobin: 10.8 g/dL — ABNORMAL LOW (ref 12.0–15.0)
MCH: 28.8 pg (ref 26.0–34.0)
MCHC: 32.9 g/dL (ref 30.0–36.0)
MCV: 87.5 fL (ref 80.0–100.0)
Platelets: 265 10*3/uL (ref 150–400)
RBC: 3.75 MIL/uL — ABNORMAL LOW (ref 3.87–5.11)
RDW: 13.1 % (ref 11.5–15.5)
WBC: 7 10*3/uL (ref 4.0–10.5)
nRBC: 0 % (ref 0.0–0.2)

## 2021-05-26 LAB — COMPREHENSIVE METABOLIC PANEL
ALT: 26 U/L (ref 0–44)
AST: 22 U/L (ref 15–41)
Albumin: 3.1 g/dL — ABNORMAL LOW (ref 3.5–5.0)
Alkaline Phosphatase: 117 U/L (ref 38–126)
Anion gap: 7 (ref 5–15)
BUN: 5 mg/dL — ABNORMAL LOW (ref 6–20)
CO2: 22 mmol/L (ref 22–32)
Calcium: 8.8 mg/dL — ABNORMAL LOW (ref 8.9–10.3)
Chloride: 104 mmol/L (ref 98–111)
Creatinine, Ser: 0.53 mg/dL (ref 0.44–1.00)
GFR, Estimated: >60 mL/min (ref >60)
Glucose, Bld: 102 mg/dL — ABNORMAL HIGH (ref 70–99)
Potassium: 3.6 mmol/L (ref 3.5–5.1)
Sodium: 133 mmol/L — ABNORMAL LOW (ref 135–145)
Total Bilirubin: 0.3 mg/dL (ref 0.3–1.2)
Total Protein: 6.5 g/dL (ref 6.5–8.1)

## 2021-05-26 MED ORDER — ALBUTEROL SULFATE (2.5 MG/3ML) 0.083% IN NEBU
2.5000 mg | INHALATION_SOLUTION | Freq: Four times a day (QID) | RESPIRATORY_TRACT | Status: DC | PRN
  Administered 2021-05-26: 2.5 mg via RESPIRATORY_TRACT
  Filled 2021-05-26 (×2): qty 3

## 2021-05-26 MED ORDER — PSEUDOEPHEDRINE HCL 30 MG PO TABS
30.0000 mg | ORAL_TABLET | Freq: Once | ORAL | Status: AC
  Administered 2021-05-26: 30 mg via ORAL
  Filled 2021-05-26: qty 1

## 2021-05-26 MED ORDER — ALBUTEROL SULFATE (2.5 MG/3ML) 0.083% IN NEBU
2.5000 mg | INHALATION_SOLUTION | Freq: Once | RESPIRATORY_TRACT | Status: AC
  Administered 2021-05-26: 2.5 mg via RESPIRATORY_TRACT

## 2021-05-26 MED ORDER — ALBUTEROL SULFATE HFA 108 (90 BASE) MCG/ACT IN AERS: 1.0000 | INHALATION_SPRAY | Freq: Four times a day (QID) | RESPIRATORY_TRACT | 0 refills | Status: DC | PRN

## 2021-05-26 MED ORDER — IOHEXOL 350 MG/ML SOLN
75.0000 mL | Freq: Once | INTRAVENOUS | Status: AC | PRN
  Administered 2021-05-26: 75 mL via INTRAVENOUS

## 2021-05-26 MED ORDER — FLUTICASONE PROPIONATE 50 MCG/ACT NA SUSP: 2.0000 | Freq: Every day | NASAL | 2 refills | Status: DC

## 2021-05-26 NOTE — Progress Notes (Signed)
Follow up on call routed to call center over lunch hour. Pt called reporting SOB and possible seizure. Call center RN advised pt to call 911, then call disconnected. When call center RN called back the pt reported that she was going to ED via [place]. TC to speak with pt. Pt picked up then immediately disconnected.Pt noted to be currently located in bed 1S MAU.

## 2021-05-26 NOTE — MAU Provider Note (Addendum)
History     CSN: [id number]  Arrival date and time: 05/26/21 1416   Event Date/Time   First Provider Initiated Contact with Patient 05/26/21 1500      Chief Complaint  Patient presents with   sinus congestion   Abdominal Pain   Decreased Fetal Movement   Latoya Stark is a 26 y.o. [id number] at 53w5dwith a PMHx of epilepsy, gestational diabetes, obesity with BMI 59, chronic migraines, and asthma who presents to MAU with a 2 day history of SOB and a 1 day history of decreased fetal movement. SOB, nasal congestion, and sneezing began 2 days ago when she went outside to play with her 234year old son. She has asthma that worsens with environmental triggers. She usually takes Benadryl, Claritin, and Albuterol inhaler which improve her symptoms. She has not taken them because she was worried about their impact on pregnancy. Her SOB worsened when she woke up around 1030/1100 gasping for air and felt increased fetal movement. She endorses chest pain worse with inspiration. She does snore at night. She denies cough, fever, sore throat, and chills. She endorses bilateral ankle swelling and a macular rash on left medial calf x 1 month. She denies calf pain or unilateral leg swelling.  Abdominal Pain Associated symptoms include headaches. Pertinent negatives include no fever, nausea or vomiting.   OB History     Gravida  8   Para  1   Term  1   Preterm      AB  6   Living  1      SAB  4   IAB  1   Ectopic      Multiple      Live Births  1           Past Medical History:  Diagnosis Date   ADHD (attention deficit hyperactivity disorder)    Anemia    Anxiety    Asthma    Chronic migraine 08/01/2018   Depression    Gestational diabetes    Gonorrhea in female 10/05/2013   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[place] 08/01/2018   Headache    Obesity    Seizures ([place]    "when I get too hot"   Trichomonas infection    UTI (lower urinary tract infection)     Past Surgical  History:  Procedure Laterality Date   ADENOIDECTOMY     DILATION AND EVACUATION N/A 08/09/2013   Procedure: DILATATION AND EVACUATION;  Surgeon: BFrederico Hamman MD;  [facility];  Service: Gynecology;  Laterality: N/A;   ORTHOPEDIC SURGERY Left    left hand   TONSILLECTOMY      Family History  Problem Relation Age of Onset   Heart disease Mother    Cancer Mother        Bone cancer   Seizures Mother    Stroke Mother    Sickle cell anemia Mother    Hypertension Father    Seizures Maternal Aunt    Diabetes Maternal Grandmother    Colon cancer Maternal Grandmother    Hypertension Maternal Grandmother    Clotting disorder Maternal Grandmother    Breast cancer Maternal Grandmother    Stroke Maternal Grandmother    Diabetes Paternal Grandmother     Social History   Tobacco Use   Smoking status: Never   Smokeless tobacco: Never  Vaping Use   Vaping Use: Never used  Substance Use Topics   Alcohol use: Not Currently    Comment:  occ   Drug use: No    Allergies:  Allergies  Allergen Reactions   Hydrocodone Hives   Milk-Related Compounds Dermatitis    Breaks face out *can have 2% milk*   Mushroom Extract Complex Anaphylaxis   Penicillins Anaphylaxis    Has patient had a PCN reaction causing immediate rash, facial/tongue/throat swelling, SOB or lightheadedness with hypotension: Yes Has patient had a PCN reaction causing severe rash involving mucus membranes or skin necrosis: No Has patient had a PCN reaction that required hospitalization No Has patient had a PCN reaction occurring within the last 10 years: Yes If all of the above answers are "NO", then may proceed with Cephalosporin use.    Cheese Other (See Comments)    Breaks face out   Latex Itching and Swelling   Sulfa Antibiotics Swelling    Medications Prior to Admission  Medication Sig Dispense Refill Last Dose   Accu-Chek Softclix Lancets lancets 1 each by Other route 4 (four) times daily. Use as  instructed 100 each 12 05/26/2021   Blood Glucose Monitoring Suppl (ACCU-CHEK GUIDE) w/Device KIT 1 Device by Does not apply route in the morning, at noon, in the evening, and at bedtime. 1 kit 0 05/26/2021   Blood Pressure Monitoring (BLOOD PRESSURE CUFF) MISC 1 Device by Does not apply route once a week. 1 each 0 05/26/2021   Blood Pressure Monitoring (BLOOD PRESSURE KIT) DEVI 1 Device by Does not apply route once a week. 1 each 0 05/26/2021   butalbital-acetaminophen-caffeine (FIORICET) 50-325-40 MG tablet Take 2 tablets by mouth every 6 (six) hours as needed for headache. 20 tablet 0 05/26/2021   ferrous sulfate 325 (65 FE) MG tablet Take 1 tablet (325 mg total) by mouth 2 (two) times daily with a meal. 60 tablet 5 05/26/2021   glucose blood (ACCU-CHEK GUIDE) test strip Use as instructed 100 each 12 05/26/2021   lamoTRIgine (LAMICTAL) 200 MG tablet Take 1 tablet (200 mg total) by mouth 2 (two) times daily. 60 tablet 5 05/26/2021   lamoTRIgine (LAMICTAL) 25 MG tablet Take 1 tablet (25 mg total) by mouth 2 (two) times daily. Take with 200 mg tablet for total dose of 247m two times daily. 60 tablet 5 05/26/2021   levETIRAcetam (KEPPRA) 750 MG tablet Take 2 tablets (1,500 mg total) by mouth 2 (two) times daily. 120 tablet 11 05/26/2021   metFORMIN (GLUCOPHAGE) 500 MG tablet Take 1 tablet (500 mg total) by mouth 2 (two) times daily with a meal. 60 tablet 5 05/26/2021   metoCLOPramide (REGLAN) 10 MG tablet Take 1 tablet (10 mg total) by mouth every 6 (six) hours as needed for nausea. 30 tablet 2 05/26/2021   ondansetron (ZOFRAN ODT) 4 MG disintegrating tablet Take 1 tablet (4 mg total) by mouth every 8 (eight) hours as needed for nausea or vomiting. 12 tablet 2 05/25/2021   pantoprazole (PROTONIX) 40 MG tablet Take 1 tablet (40 mg total) by mouth 2 (two) times daily. 30 tablet 4 05/26/2021   Prenatal Vit-Fe Fumarate-FA (PREPLUS) 27-1 MG TABS Take 1 tablet by mouth daily. 30 tablet 13 05/26/2021   promethazine  (PHENERGAN) 25 MG tablet Take 1 tablet (25 mg total) by mouth every 6 (six) hours as needed for nausea or vomiting. 30 tablet 2 05/26/2021   acetaminophen (TYLENOL) 500 MG tablet Take 1,000 mg by mouth every 6 (six) hours as needed for headache.      albuterol (PROVENTIL HFA;VENTOLIN HFA) 108 (90 Base) MCG/ACT inhaler Inhale 2 puffs into the  lungs every 6 (six) hours as needed for wheezing or shortness of breath. 1 Inhaler 0 More than a month    Review of Systems  Constitutional:  Negative for chills and fever.  HENT:  Positive for congestion, rhinorrhea and sneezing. Negative for sore throat.   Eyes:  Negative for visual disturbance.  Respiratory:  Positive for shortness of breath. Negative for cough.   Cardiovascular:  Positive for chest pain. Negative for leg swelling.  Gastrointestinal:  Positive for abdominal pain. Negative for nausea and vomiting.  Genitourinary:  Negative for vaginal bleeding.  Musculoskeletal:  Positive for back pain.  Neurological:  Positive for headaches.  Psychiatric/Behavioral: Negative.    Physical Exam   Blood pressure 122/72, pulse (!) 117, temperature 98.9 F (37.2 C), temperature source Oral, resp. rate 20, height '5\' 3"'  (1.6 m), weight (!) 151.8 kg, last menstrual period 10/02/2020, SpO2 98 %, unknown if currently breastfeeding.  Physical Exam Constitutional:      Appearance: Normal appearance. She is obese.  HENT:     Head: Normocephalic and atraumatic.  Cardiovascular:     Rate and Rhythm: Normal rate and regular rhythm.     Heart sounds: Normal heart sounds. No murmur heard. Pulmonary:     Effort: Pulmonary effort is normal. No respiratory distress.     Breath sounds: Normal breath sounds. No wheezing or rales.  Abdominal:     General: Bowel sounds are normal.     Palpations: Abdomen is soft.     Tenderness: There is no abdominal tenderness.  Musculoskeletal:     Right lower leg: No swelling. No edema.     Left lower leg: No swelling. No  edema.     Right ankle: Swelling present.     Left ankle: Swelling present.  Skin:    General: Skin is warm and dry.     Findings: Rash present. Rash is macular.     Comments: 1.5 cm macular rash on left medial calf   Neurological:     General: No focal deficit present.     Mental Status: She is alert.  Psychiatric:        Mood and Affect: Mood normal.   FHR: 130-135, moderate variability, accelerations 15x15, no decelerations  NST: nonreactive BPP: 8/8  MAU Course  Procedures  MDM Differential for her symptoms include asthma exacerbated by seasonal allergies, lung infection, obstructive sleep apnea, PE, and pre-eclampsia. CMP notable for glucose 102 and sodium 133. CBC Hgb/Hct 10.8/32.8. EKG with new EKG change from baseline with Q wave present in lead 3 and T wave inversion in lead 3. CXR was normal. CTA pending. NST was nonreactive. BPP 8/8. She was administered Sudafed for congestion and albuterol nebulizer for SOB.   Assessment and Plan   Leonette Nutting 05/26/2021, 3:55 PM

## 2021-05-26 NOTE — MAU Note (Signed)
Latoya Stark is a 26 y.o. at 55w5dhere in MAU reporting: this morning, unsure if she passed out or fell asleep, woke up be the baby moving very erratically, then abruptly stopped. Hadn't moved since until listening to F[facility]culatorin triage.  allergies have been acting up past 2 days. , feeling congested, coughing a lot- this is causing a pulling pain in her lower abd, at times feels SOB.  Unsure what she can take for it.  No bleeding or LOF.   Onset of complaint: 2 days ago Pain score: 10 with movement, 5 when she sits still Vitals:   05/26/21 1431 05/26/21 1433  BP:  132/82  Pulse:  (!) 117  Resp:  20  Temp:  98.9 F (37.2 C)  SpO2: 100% 99%     FHT:145 Lab orders placed from triage:  urine

## 2021-05-26 NOTE — MAU Note (Signed)
Transport brought pt back to unit and stated they were not able to do the scan. RN called CT and was told "the IV was bad so we did not do the scan." Provider informed and IV team consult order placed.  RN removed IV, site was swollen and tender.

## 2021-05-26 NOTE — MAU Note (Signed)
RT at bedside for breathing treatment.  

## 2021-05-26 NOTE — MAU Note (Signed)
Pt consented to CT per Dr. Dione Plover. IV team returned to bedside.

## 2021-05-26 NOTE — MAU Provider Note (Addendum)
History     CSN: [id number]  Arrival date and time: 05/26/21 1416   Event Date/Time   First Provider Initiated Contact with Patient 05/26/21 1500      Chief Complaint  Patient presents with   sinus congestion   Abdominal Pain   Decreased Fetal Movement   26 y.o. G8P1061 @33.5 wks presenting with SOB and DFM. SOB started 2 days ago. She describes as inability to take a deep breath. Her chest feels tight with inspiration. Prior to onset she had been outside playing with her son. Associated sx are nasal congestion, sneezing, and rhinorrhea. She reports seasonal allergies in the summer and fall but hasn't been taking her meds because she didn't know if they were safe in pregnancy. Reports an episode this am in which she woke up gasping for air and her baby was moving a lot. After that she didn't feel much FM. She also reports ankle swelling and a rash on her left calf that she first noticed a month ago.    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   Headache    Obesity    Seizures (HCC)    "when I get too hot"   Trichomonas infection    UTI (lower urinary tract infection)     Past Surgical History:  Procedure Laterality Date   ADENOIDECTOMY     DILATION AND EVACUATION N/A 08/09/2013   Procedure: DILATATION AND EVACUATION;  Surgeon: Bernard A Marshall, MD;  Location: [facility];  Service: Gynecology;  Laterality: N/A;   ORTHOPEDIC SURGERY Left    left hand   TONSILLECTOMY      Family History  Problem Relation Age of Onset   Heart disease Mother    Cancer Mother        Bone  cancer   Seizures Mother    Stroke Mother    Sickle cell anemia Mother    Hypertension Father    Seizures Maternal Aunt    Diabetes Maternal Grandmother    Colon cancer Maternal Grandmother    Hypertension Maternal Grandmother    Clotting disorder Maternal Grandmother    Breast cancer Maternal Grandmother    Stroke Maternal Grandmother    Diabetes Paternal Grandmother     Social History   Tobacco Use   Smoking status: Never   Smokeless tobacco: Never  Vaping Use   Vaping Use: Never used  Substance Use Topics   Alcohol use: Not Currently    Comment: occ   Drug use: No    Allergies:  Allergies  Allergen Reactions   Hydrocodone Hives   Milk-Related Compounds Dermatitis    Breaks face out *can have 2% milk*   Mushroom Extract Complex Anaphylaxis   Penicillins Anaphylaxis    Has patient had a PCN reaction causing immediate   rash, facial/tongue/throat swelling, SOB or lightheadedness with hypotension: Yes Has patient had a PCN reaction causing severe rash involving mucus membranes or skin necrosis: No Has patient had a PCN reaction that required hospitalization No Has patient had a PCN reaction occurring within the last 10 years: Yes If all of the above answers are "NO", then may proceed with Cephalosporin use.    Cheese Other (See Comments)    Breaks face out   Latex Itching and Swelling   Sulfa Antibiotics Swelling    Medications Prior to Admission  Medication Sig Dispense Refill Last Dose   Accu-Chek Softclix Lancets lancets 1 each by Other route 4 (four) times daily. Use as instructed 100 each 12 05/26/2021   Blood Glucose Monitoring Suppl (ACCU-CHEK GUIDE) w/Device KIT 1 Device by Does not apply route in the morning, at noon, in the evening, and at bedtime. 1 kit 0 05/26/2021   Blood Pressure Monitoring (BLOOD PRESSURE CUFF) MISC 1 Device by Does not apply route once a week. 1 each 0 05/26/2021   Blood Pressure Monitoring (BLOOD PRESSURE KIT) DEVI 1 Device by Does not  apply route once a week. 1 each 0 05/26/2021   butalbital-acetaminophen-caffeine (FIORICET) 50-325-40 MG tablet Take 2 tablets by mouth every 6 (six) hours as needed for headache. 20 tablet 0 05/26/2021   ferrous sulfate 325 (65 FE) MG tablet Take 1 tablet (325 mg total) by mouth 2 (two) times daily with a meal. 60 tablet 5 05/26/2021   glucose blood (ACCU-CHEK GUIDE) test strip Use as instructed 100 each 12 05/26/2021   lamoTRIgine (LAMICTAL) 200 MG tablet Take 1 tablet (200 mg total) by mouth 2 (two) times daily. 60 tablet 5 05/26/2021   lamoTRIgine (LAMICTAL) 25 MG tablet Take 1 tablet (25 mg total) by mouth 2 (two) times daily. Take with 200 mg tablet for total dose of 225mg two times daily. 60 tablet 5 05/26/2021   levETIRAcetam (KEPPRA) 750 MG tablet Take 2 tablets (1,500 mg total) by mouth 2 (two) times daily. 120 tablet 11 05/26/2021   metFORMIN (GLUCOPHAGE) 500 MG tablet Take 1 tablet (500 mg total) by mouth 2 (two) times daily with a meal. 60 tablet 5 05/26/2021   metoCLOPramide (REGLAN) 10 MG tablet Take 1 tablet (10 mg total) by mouth every 6 (six) hours as needed for nausea. 30 tablet 2 05/26/2021   ondansetron (ZOFRAN ODT) 4 MG disintegrating tablet Take 1 tablet (4 mg total) by mouth every 8 (eight) hours as needed for nausea or vomiting. 12 tablet 2 05/25/2021   pantoprazole (PROTONIX) 40 MG tablet Take 1 tablet (40 mg total) by mouth 2 (two) times daily. 30 tablet 4 05/26/2021   Prenatal Vit-Fe Fumarate-FA (PREPLUS) 27-1 MG TABS Take 1 tablet by mouth daily. 30 tablet 13 05/26/2021   promethazine (PHENERGAN) 25 MG tablet Take 1 tablet (25 mg total) by mouth every 6 (six) hours as needed for nausea or vomiting. 30 tablet 2 05/26/2021   acetaminophen (TYLENOL) 500 MG tablet Take 1,000 mg by mouth every 6 (six) hours as needed for headache.      albuterol (PROVENTIL HFA;VENTOLIN HFA) 108 (90 Base) MCG/ACT inhaler Inhale 2 puffs into the lungs every 6 (six) hours as needed for wheezing or shortness of  breath. 1 Inhaler 0 More than a month    Review of Systems  Constitutional:  Negative for chills and fever.  HENT:  Positive for congestion, rhinorrhea and sneezing.   Eyes:  Negative for visual disturbance.  Respiratory:  Positive   for shortness of breath. Negative for cough.   Cardiovascular:  Positive for chest pain (tightness) and leg swelling.  Gastrointestinal:  Negative for abdominal pain.  Genitourinary:  Negative for vaginal bleeding and vaginal discharge.  Neurological:  Negative for headaches.  Physical Exam   Blood pressure 122/72, pulse (!) 117, temperature 98.9 F (37.2 C), temperature source Oral, resp. rate 20, height 5' 3" (1.6 m), weight (!) 151.8 kg, last menstrual period 10/02/2020, SpO2 98 %, unknown if currently breastfeeding.  Physical Exam Vitals and nursing note reviewed.  Constitutional:      General: She is not in acute distress.    Appearance: Normal appearance.  HENT:     Head: Normocephalic and atraumatic.  Cardiovascular:     Rate and Rhythm: Tachycardia present. Rhythm irregular.     Heart sounds: Normal heart sounds.  Pulmonary:     Effort: Pulmonary effort is normal.     Breath sounds: Examination of the right-upper field reveals decreased breath sounds. Examination of the left-upper field reveals decreased breath sounds. Examination of the right-middle field reveals decreased breath sounds. Examination of the left-middle field reveals decreased breath sounds. Examination of the right-lower field reveals decreased breath sounds. Examination of the left-lower field reveals decreased breath sounds. Decreased breath sounds present.  Musculoskeletal:        General: Normal range of motion.     Cervical back: Normal range of motion.     Right lower leg: Edema (trace) present.     Left lower leg: Edema (trace) present.  Skin:    General: Skin is warm and dry.       Neurological:     General: No focal deficit present.     Mental Status: She is alert  and oriented to person, place, and time.  Psychiatric:        Mood and Affect: Mood normal.        Behavior: Behavior normal.  EFM: 135 bpm, mod variability, no accels, no decels Toco: none  Results for orders placed or performed during the hospital encounter of 05/26/21 (from the past 24 hour(s))  CBC     Status: Abnormal   Collection Time: 05/26/21  4:37 PM  Result Value Ref Range   WBC 7.0 4.0 - 10.5 K/uL   RBC 3.75 (L) 3.87 - 5.11 MIL/uL   Hemoglobin 10.8 (L) 12.0 - 15.0 g/dL   HCT 32.8 (L) 36.0 - 46.0 %   MCV 87.5 80.0 - 100.0 fL   MCH 28.8 26.0 - 34.0 pg   MCHC 32.9 30.0 - 36.0 g/dL   RDW 13.1 11.5 - 15.5 %   Platelets 265 150 - 400 K/uL   nRBC 0.0 0.0 - 0.2 %  Comprehensive metabolic panel     Status: Abnormal   Collection Time: 05/26/21  4:37 PM  Result Value Ref Range   Sodium 133 (L) 135 - 145 mmol/L   Potassium 3.6 3.5 - 5.1 mmol/L   Chloride 104 98 - 111 mmol/L   CO2 22 22 - 32 mmol/L   Glucose, Bld 102 (H) 70 - 99 mg/dL   BUN 5 (L) 6 - 20 mg/dL   Creatinine, Ser 0.53 0.44 - 1.00 mg/dL   Calcium 8.8 (L) 8.9 - 10.3 mg/dL   Total Protein 6.5 6.5 - 8.1 g/dL   Albumin 3.1 (L) 3.5 - 5.0 g/dL   AST 22 15 - 41 U/L   ALT 26 0 - 44 U/L   Alkaline Phosphatase 117 38 - 126   U/L   Total Bilirubin 0.3 0.3 - 1.2 mg/dL   GFR, Estimated >60 >60 mL/min   Anion gap 7 5 - 15    DG Chest Port 1 View  Result Date: 05/26/2021 CLINICAL DATA:  Shortness of breath EXAM: PORTABLE CHEST 1 VIEW COMPARISON:  03/10/2016 FINDINGS: Cardiac and mediastinal contours are within normal limits given AP technique and projection. No focal pulmonary opacity. No pleural effusion or pneumothorax. No acute osseous abnormality. IMPRESSION: No acute cardiopulmonary process. Electronically Signed   By: Alison  Vasan M.D.   On: 05/26/2021 16:13   US MFM FETAL BPP WO NON STRESS  Result Date: 05/26/2021 ----------------------------------------------------------------------  OBSTETRICS REPORT                        (Signed Final 05/26/2021 05:20 pm) ---------------------------------------------------------------------- Patient Info  ID #:       [id number]                          D.O.B.:  04/15/1995 (25 yrs)  Name:       Latoya Stark                 Visit Date: 05/26/2021 04:02 pm ---------------------------------------------------------------------- Performed By  Attending:        Corenthian Booker      Ref. Address:     [address]                    [place]                                                             [place], [place]                                                             [id number]  Performed By:     Katherine Speake       [facility]  Referred By:      [facility] ---------------------------------------------------------------------- Orders  #  Description                           Code        Ordered By  1  US MFM FETAL BPP WO NON               76819.01    MELANIE BHAMBRI     STRESS ----------------------------------------------------------------------  #  Order #                       Accession #                Episode #  1  [id number]                   [id number]                 [id number] ---------------------------------------------------------------------- Indications  Decreased fetal movement                       O36.8190  [redacted] weeks gestation of pregnancy                Z3A.33 ---------------------------------------------------------------------- Fetal Evaluation  Num Of Fetuses:         1  Fetal Heart Rate(bpm):  148  Cardiac Activity:       Observed  Presentation:           Cephalic  Placenta:               Posterior  P. Cord Insertion:      Previously Visualized  Amniotic Fluid  AFI FV:      Within normal limits  AFI Sum(cm)     %Tile       Largest Pocket(cm)  14.6            52          4.5  RUQ(cm)       RLQ(cm)       LUQ(cm)        LLQ(cm)  4.3           2.6            3.2            4.5 ---------------------------------------------------------------------- Biophysical Evaluation  Amniotic F.V:   Pocket => 2 cm             F. Tone:        Observed  F. Movement:    Observed                   Score:          8/8  F. Breathing:   Observed ---------------------------------------------------------------------- OB History  Gravidity:    7         Term:   1        Prem:   0        SAB:   5  TOP:          0       Ectopic:  0        Living: 1 ---------------------------------------------------------------------- Gestational Age  LMP:           33w 5d        Date:  10/02/20                 EDD:   07/09/21  Best:          33w 5d     Det. By:  LMP  (10/02/20)          EDD:   07/09/21 ---------------------------------------------------------------------- Anatomy  Diaphragm:             Appears normal         Kidneys:                Appear normal  Stomach:               Appears normal, left   Bladder:                  Appears normal                         sided ---------------------------------------------------------------------- Impression  Antenatal testing performed given maternal decreaesed fetal  movement.  The biophysical profile was 8/8 with good fetal movement and  amniotic fluid volume. ---------------------------------------------------------------------- Recommendations  Clinical correlation recommended. ----------------------------------------------------------------------               Sander Nephew, MD Electronically Signed Final Report   05/26/2021 05:20 pm ----------------------------------------------------------------------   MAU Course  Procedures Albuterol inhaler  MDM Labs and EKG ordered. NST not reactive, will obtain BPP.  EKG interpreted by Dr. Dione Plover acute changes noted t-wave and q-wave inversion in lead III, recommends CT to r/o PE.  BPP 8/8. CT pending. Transfer of care given to Leslye Peer, [place]  05/26/2021 8:17 PM   CTA:    IMPRESSION: 1. Suboptimal evaluation of the pulmonary vasculature, due to poor timing of the contrast bolus. There is no large central pulmonary embolus within the main pulmonary artery. Evaluation of the segmental and subsegmental branches of the pulmonary artery is nondiagnostic for pulmonary embolus. 2. No acute intrathoracic process. 3. Hepatic steatosis. 4. 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  Assessment and Plan  A:   Single IUP at [redacted]w[redacted]d       Asthma exacerbation       Shortness of breath      Nasal congestion  P:   Discharge home        Rx Albuterol inhaler       Rx Flonase for congestion        May use sudafed prn congestion        Dyspnea precautions       Encouraged to return if she develops worsening of symptoms, increase in pain, fever, or other concerning symptoms.   WSeabron Spates CNM

## 2021-05-26 NOTE — MAU Note (Signed)
Called [facility] RT Alyssa, for breathing treatment.

## 2021-05-26 NOTE — MAU Provider Note (Incomplete Revision)
History     CSN: [id number]  Arrival date and time: 05/26/21 1416   Event Date/Time   First Provider Initiated Contact with Patient 05/26/21 1500      Chief Complaint  Patient presents with   sinus congestion   Abdominal Pain   Decreased Fetal Movement   26 y.o. G8P1061 @33.5 wks presenting with SOB and DFM. SOB started 2 days ago. She describes as inability to take a deep breath. Her chest feels tight with inspiration. Prior to onset she had been outside playing with her son. Associated sx are nasal congestion, sneezing, and rhinorrhea. She reports seasonal allergies in the summer and fall but hasn't been taking her meds because she didn't know if they were safe in pregnancy. Reports an episode this am in which she woke up gasping for air and her baby was moving a lot. After that she didn't feel much FM. She also reports ankle swelling and a rash on her left calf that she first noticed a month ago.    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   Headache    Obesity    Seizures (HCC)    "when I get too hot"   Trichomonas infection    UTI (lower urinary tract infection)     Past Surgical History:  Procedure Laterality Date   ADENOIDECTOMY     DILATION AND EVACUATION N/A 08/09/2013   Procedure: DILATATION AND EVACUATION;  Surgeon: Bernard A Marshall, MD;  Location: [facility];  Service: Gynecology;  Laterality: N/A;   ORTHOPEDIC SURGERY Left    left hand   TONSILLECTOMY      Family History  Problem Relation Age of Onset   Heart disease Mother    Cancer Mother        Bone  cancer   Seizures Mother    Stroke Mother    Sickle cell anemia Mother    Hypertension Father    Seizures Maternal Aunt    Diabetes Maternal Grandmother    Colon cancer Maternal Grandmother    Hypertension Maternal Grandmother    Clotting disorder Maternal Grandmother    Breast cancer Maternal Grandmother    Stroke Maternal Grandmother    Diabetes Paternal Grandmother     Social History   Tobacco Use   Smoking status: Never   Smokeless tobacco: Never  Vaping Use   Vaping Use: Never used  Substance Use Topics   Alcohol use: Not Currently    Comment: occ   Drug use: No    Allergies:  Allergies  Allergen Reactions   Hydrocodone Hives   Milk-Related Compounds Dermatitis    Breaks face out *can have 2% milk*   Mushroom Extract Complex Anaphylaxis   Penicillins Anaphylaxis    Has patient had a PCN reaction causing immediate   rash, facial/tongue/throat swelling, SOB or lightheadedness with hypotension: Yes Has patient had a PCN reaction causing severe rash involving mucus membranes or skin necrosis: No Has patient had a PCN reaction that required hospitalization No Has patient had a PCN reaction occurring within the last 10 years: Yes If all of the above answers are "NO", then may proceed with Cephalosporin use.    Cheese Other (See Comments)    Breaks face out   Latex Itching and Swelling   Sulfa Antibiotics Swelling    Medications Prior to Admission  Medication Sig Dispense Refill Last Dose   Accu-Chek Softclix Lancets lancets 1 each by Other route 4 (four) times daily. Use as instructed 100 each 12 05/26/2021   Blood Glucose Monitoring Suppl (ACCU-CHEK GUIDE) w/Device KIT 1 Device by Does not apply route in the morning, at noon, in the evening, and at bedtime. 1 kit 0 05/26/2021   Blood Pressure Monitoring (BLOOD PRESSURE CUFF) MISC 1 Device by Does not apply route once a week. 1 each 0 05/26/2021   Blood Pressure Monitoring (BLOOD PRESSURE KIT) DEVI 1 Device by Does not  apply route once a week. 1 each 0 05/26/2021   butalbital-acetaminophen-caffeine (FIORICET) 50-325-40 MG tablet Take 2 tablets by mouth every 6 (six) hours as needed for headache. 20 tablet 0 05/26/2021   ferrous sulfate 325 (65 FE) MG tablet Take 1 tablet (325 mg total) by mouth 2 (two) times daily with a meal. 60 tablet 5 05/26/2021   glucose blood (ACCU-CHEK GUIDE) test strip Use as instructed 100 each 12 05/26/2021   lamoTRIgine (LAMICTAL) 200 MG tablet Take 1 tablet (200 mg total) by mouth 2 (two) times daily. 60 tablet 5 05/26/2021   lamoTRIgine (LAMICTAL) 25 MG tablet Take 1 tablet (25 mg total) by mouth 2 (two) times daily. Take with 200 mg tablet for total dose of 225mg two times daily. 60 tablet 5 05/26/2021   levETIRAcetam (KEPPRA) 750 MG tablet Take 2 tablets (1,500 mg total) by mouth 2 (two) times daily. 120 tablet 11 05/26/2021   metFORMIN (GLUCOPHAGE) 500 MG tablet Take 1 tablet (500 mg total) by mouth 2 (two) times daily with a meal. 60 tablet 5 05/26/2021   metoCLOPramide (REGLAN) 10 MG tablet Take 1 tablet (10 mg total) by mouth every 6 (six) hours as needed for nausea. 30 tablet 2 05/26/2021   ondansetron (ZOFRAN ODT) 4 MG disintegrating tablet Take 1 tablet (4 mg total) by mouth every 8 (eight) hours as needed for nausea or vomiting. 12 tablet 2 05/25/2021   pantoprazole (PROTONIX) 40 MG tablet Take 1 tablet (40 mg total) by mouth 2 (two) times daily. 30 tablet 4 05/26/2021   Prenatal Vit-Fe Fumarate-FA (PREPLUS) 27-1 MG TABS Take 1 tablet by mouth daily. 30 tablet 13 05/26/2021   promethazine (PHENERGAN) 25 MG tablet Take 1 tablet (25 mg total) by mouth every 6 (six) hours as needed for nausea or vomiting. 30 tablet 2 05/26/2021   acetaminophen (TYLENOL) 500 MG tablet Take 1,000 mg by mouth every 6 (six) hours as needed for headache.      albuterol (PROVENTIL HFA;VENTOLIN HFA) 108 (90 Base) MCG/ACT inhaler Inhale 2 puffs into the lungs every 6 (six) hours as needed for wheezing or shortness of  breath. 1 Inhaler 0 More than a month    Review of Systems  Constitutional:  Negative for chills and fever.  HENT:  Positive for congestion, rhinorrhea and sneezing.   Eyes:  Negative for visual disturbance.  Respiratory:  Positive   for shortness of breath. Negative for cough.   Cardiovascular:  Positive for chest pain (tightness) and leg swelling.  Gastrointestinal:  Negative for abdominal pain.  Genitourinary:  Negative for vaginal bleeding and vaginal discharge.  Neurological:  Negative for headaches.  Physical Exam   Blood pressure 122/72, pulse (!) 117, temperature 98.9 F (37.2 C), temperature source Oral, resp. rate 20, height 5' 3" (1.6 m), weight (!) 151.8 kg, last menstrual period 10/02/2020, SpO2 98 %, unknown if currently breastfeeding.  Physical Exam Vitals and nursing note reviewed.  Constitutional:      General: She is not in acute distress.    Appearance: Normal appearance.  HENT:     Head: Normocephalic and atraumatic.  Cardiovascular:     Rate and Rhythm: Tachycardia present. Rhythm irregular.     Heart sounds: Normal heart sounds.  Pulmonary:     Effort: Pulmonary effort is normal.     Breath sounds: Examination of the right-upper field reveals decreased breath sounds. Examination of the left-upper field reveals decreased breath sounds. Examination of the right-middle field reveals decreased breath sounds. Examination of the left-middle field reveals decreased breath sounds. Examination of the right-lower field reveals decreased breath sounds. Examination of the left-lower field reveals decreased breath sounds. Decreased breath sounds present.  Musculoskeletal:        General: Normal range of motion.     Cervical back: Normal range of motion.     Right lower leg: Edema (trace) present.     Left lower leg: Edema (trace) present.  Skin:    General: Skin is warm and dry.       Neurological:     General: No focal deficit present.     Mental Status: She is alert  and oriented to person, place, and time.  Psychiatric:        Mood and Affect: Mood normal.        Behavior: Behavior normal.  EFM: 135 bpm, mod variability, no accels, no decels Toco: none  Results for orders placed or performed during the hospital encounter of 05/26/21 (from the past 24 hour(s))  CBC     Status: Abnormal   Collection Time: 05/26/21  4:37 PM  Result Value Ref Range   WBC 7.0 4.0 - 10.5 K/uL   RBC 3.75 (L) 3.87 - 5.11 MIL/uL   Hemoglobin 10.8 (L) 12.0 - 15.0 g/dL   HCT 32.8 (L) 36.0 - 46.0 %   MCV 87.5 80.0 - 100.0 fL   MCH 28.8 26.0 - 34.0 pg   MCHC 32.9 30.0 - 36.0 g/dL   RDW 13.1 11.5 - 15.5 %   Platelets 265 150 - 400 K/uL   nRBC 0.0 0.0 - 0.2 %  Comprehensive metabolic panel     Status: Abnormal   Collection Time: 05/26/21  4:37 PM  Result Value Ref Range   Sodium 133 (L) 135 - 145 mmol/L   Potassium 3.6 3.5 - 5.1 mmol/L   Chloride 104 98 - 111 mmol/L   CO2 22 22 - 32 mmol/L   Glucose, Bld 102 (H) 70 - 99 mg/dL   BUN 5 (L) 6 - 20 mg/dL   Creatinine, Ser 0.53 0.44 - 1.00 mg/dL   Calcium 8.8 (L) 8.9 - 10.3 mg/dL   Total Protein 6.5 6.5 - 8.1 g/dL   Albumin 3.1 (L) 3.5 - 5.0 g/dL   AST 22 15 - 41 U/L   ALT 26 0 - 44 U/L   Alkaline Phosphatase 117 38 - 126   U/L   Total Bilirubin 0.3 0.3 - 1.2 mg/dL   GFR, Estimated >60 >60 mL/min   Anion gap 7 5 - 15    DG Chest Port 1 View  Result Date: 05/26/2021 CLINICAL DATA:  Shortness of breath EXAM: PORTABLE CHEST 1 VIEW COMPARISON:  03/10/2016 FINDINGS: Cardiac and mediastinal contours are within normal limits given AP technique and projection. No focal pulmonary opacity. No pleural effusion or pneumothorax. No acute osseous abnormality. IMPRESSION: No acute cardiopulmonary process. Electronically Signed   By: Alison  Vasan M.D.   On: 05/26/2021 16:13   US MFM FETAL BPP WO NON STRESS  Result Date: 05/26/2021 ----------------------------------------------------------------------  OBSTETRICS REPORT                        (Signed Final 05/26/2021 05:20 pm) ---------------------------------------------------------------------- Patient Info  ID #:       [id number]                          D.O.B.:  04/09/1995 (25 yrs)  Name:       Latoya Stark                 Visit Date: 05/26/2021 04:02 pm ---------------------------------------------------------------------- Performed By  Attending:        Corenthian Booker      Ref. Address:     [address]                    [place]                                                             [place], [place]                                                             [id number]  Performed By:     Katherine Speake       [facility]  Referred By:      [facility] ---------------------------------------------------------------------- Orders  #  Description                           Code        Ordered By  1  US MFM FETAL BPP WO NON               76819.01    MELANIE BHAMBRI     STRESS ----------------------------------------------------------------------  #  Order #                       Accession #                Episode #  1  [id number]                   [id number]                 [id number] ---------------------------------------------------------------------- Indications  Decreased fetal movement                       O36.8190  [redacted] weeks gestation of pregnancy                Z3A.33 ---------------------------------------------------------------------- Fetal Evaluation  Num Of Fetuses:         1  Fetal Heart Rate(bpm):  148  Cardiac Activity:       Observed  Presentation:           Cephalic  Placenta:               Posterior  P. Cord Insertion:      Previously Visualized  Amniotic Fluid  AFI FV:      Within normal limits  AFI Sum(cm)     %Tile       Largest Pocket(cm)  14.6            52          4.5  RUQ(cm)       RLQ(cm)       LUQ(cm)        LLQ(cm)  4.3           2.6            3.2            4.5 ---------------------------------------------------------------------- Biophysical Evaluation  Amniotic F.V:   Pocket => 2 cm             F. Tone:        Observed  F. Movement:    Observed                   Score:          8/8  F. Breathing:   Observed ---------------------------------------------------------------------- OB History  Gravidity:    7         Term:   1        Prem:   0        SAB:   5  TOP:          0       Ectopic:  0        Living: 1 ---------------------------------------------------------------------- Gestational Age  LMP:           33w 5d        Date:  10/02/20                 EDD:   07/09/21  Best:          33w 5d     Det. By:  LMP  (10/02/20)          EDD:   07/09/21 ---------------------------------------------------------------------- Anatomy  Diaphragm:             Appears normal         Kidneys:                Appear normal  Stomach:               Appears normal, left   Bladder:                  Appears normal                         sided ---------------------------------------------------------------------- Impression  Antenatal testing performed given maternal decreaesed fetal  movement.  The biophysical profile was 8/8 with good fetal movement and  amniotic fluid volume. ---------------------------------------------------------------------- Recommendations  Clinical correlation recommended. ----------------------------------------------------------------------               Sander Nephew, MD Electronically Signed Final Report   05/26/2021 05:20 pm ----------------------------------------------------------------------   MAU Course  Procedures Albuterol inhaler  MDM Labs and EKG ordered. NST not reactive, will obtain BPP.  EKG interpreted by Dr. Dione Plover acute changes noted t-wave and q-wave inversion in lead III, recommends CT to r/o PE.  BPP 8/8. CT pending. Transfer of care given to Leslye Peer, [place]  05/26/2021 8:17 PM   Assessment and  Plan

## 2021-05-30 ENCOUNTER — Ambulatory Visit: Payer: Medicaid Other | Attending: Obstetrics

## 2021-05-30 ENCOUNTER — Ambulatory Visit: Payer: Medicaid Other | Admitting: *Deleted

## 2021-05-30 VITALS — BP 121/71 | HR 102

## 2021-05-30 DIAGNOSIS — O24410 Gestational diabetes mellitus in pregnancy, diet controlled: Secondary | ICD-10-CM | POA: Diagnosis present

## 2021-05-30 DIAGNOSIS — O0990 Supervision of high risk pregnancy, unspecified, unspecified trimester: Secondary | ICD-10-CM | POA: Diagnosis present

## 2021-05-30 DIAGNOSIS — Z3A34 34 weeks gestation of pregnancy: Secondary | ICD-10-CM

## 2021-05-30 DIAGNOSIS — O99353 Diseases of the nervous system complicating pregnancy, third trimester: Secondary | ICD-10-CM

## 2021-05-30 DIAGNOSIS — O24415 Gestational diabetes mellitus in pregnancy, controlled by oral hypoglycemic drugs: Secondary | ICD-10-CM

## 2021-05-30 DIAGNOSIS — G40909 Epilepsy, unspecified, not intractable, without status epilepticus: Secondary | ICD-10-CM

## 2021-05-30 DIAGNOSIS — O99213 Obesity complicating pregnancy, third trimester: Secondary | ICD-10-CM

## 2021-05-30 DIAGNOSIS — O365930 Maternal care for other known or suspected poor fetal growth, third trimester, not applicable or unspecified: Secondary | ICD-10-CM

## 2021-05-30 DIAGNOSIS — Z6841 Body Mass Index (BMI) 40.0 and over, adult: Secondary | ICD-10-CM | POA: Insufficient documentation

## 2021-06-02 ENCOUNTER — Encounter: Payer: Managed Care, Other (non HMO) | Admitting: Obstetrics & Gynecology

## 2021-06-07 ENCOUNTER — Encounter: Payer: Managed Care, Other (non HMO) | Admitting: Obstetrics and Gynecology

## 2021-06-07 ENCOUNTER — Inpatient Hospital Stay (HOSPITAL_COMMUNITY)
Admission: AD | Admit: 2021-06-07 | Discharge: 2021-06-07 | Disposition: A | Discharge status: Home / Self Care | Payer: Medicaid Other | Attending: Obstetrics & Gynecology | Admitting: Obstetrics & Gynecology

## 2021-06-07 ENCOUNTER — Encounter: Payer: Self-pay | Admitting: Obstetrics and Gynecology

## 2021-06-07 ENCOUNTER — Inpatient Hospital Stay (HOSPITAL_BASED_OUTPATIENT_CLINIC_OR_DEPARTMENT_OTHER): Payer: Medicaid Other

## 2021-06-07 ENCOUNTER — Ambulatory Visit (INDEPENDENT_AMBULATORY_CARE_PROVIDER_SITE_OTHER): Payer: Medicaid Other | Admitting: Obstetrics and Gynecology

## 2021-06-07 ENCOUNTER — Encounter (HOSPITAL_COMMUNITY): Payer: Self-pay | Admitting: Obstetrics & Gynecology

## 2021-06-07 VITALS — BP 120/83 | HR 106 | Wt 332.0 lb

## 2021-06-07 DIAGNOSIS — Z3A35 35 weeks gestation of pregnancy: Secondary | ICD-10-CM | POA: Diagnosis not present

## 2021-06-07 DIAGNOSIS — G40909 Epilepsy, unspecified, not intractable, without status epilepticus: Secondary | ICD-10-CM

## 2021-06-07 DIAGNOSIS — O99353 Diseases of the nervous system complicating pregnancy, third trimester: Secondary | ICD-10-CM

## 2021-06-07 DIAGNOSIS — O368131 Decreased fetal movements, third trimester, fetus 1: Secondary | ICD-10-CM | POA: Insufficient documentation

## 2021-06-07 DIAGNOSIS — O99210 Obesity complicating pregnancy, unspecified trimester: Secondary | ICD-10-CM

## 2021-06-07 DIAGNOSIS — Z0371 Encounter for suspected problem with amniotic cavity and membrane ruled out: Secondary | ICD-10-CM

## 2021-06-07 DIAGNOSIS — O365920 Maternal care for other known or suspected poor fetal growth, second trimester, not applicable or unspecified: Secondary | ICD-10-CM

## 2021-06-07 DIAGNOSIS — O24415 Gestational diabetes mellitus in pregnancy, controlled by oral hypoglycemic drugs: Secondary | ICD-10-CM

## 2021-06-07 DIAGNOSIS — O0990 Supervision of high risk pregnancy, unspecified, unspecified trimester: Secondary | ICD-10-CM

## 2021-06-07 DIAGNOSIS — O24419 Gestational diabetes mellitus in pregnancy, unspecified control: Secondary | ICD-10-CM | POA: Diagnosis not present

## 2021-06-07 DIAGNOSIS — O26893 Other specified pregnancy related conditions, third trimester: Secondary | ICD-10-CM | POA: Insufficient documentation

## 2021-06-07 LAB — GLUCOSE, CAPILLARY: Glucose-Capillary: 124 mg/dL — ABNORMAL HIGH (ref 70–99)

## 2021-06-07 LAB — POCT FERN TEST: POCT Fern Test: NEGATIVE

## 2021-06-07 LAB — HEMOGLOBIN A1C
Hgb A1c MFr Bld: 5.5 % (ref 4.8–5.6)
Mean Plasma Glucose: 111.15 mg/dL

## 2021-06-07 NOTE — MAU Note (Addendum)
...  Latoya Stark is a 26 y.o. at 55w3dhere in MAU reporting: Sent over from office for non-reactive NST. DFM since yesterday morning. She reports she had not felt any fetal movement prior to her OB visit today but began feeling a few kicks while at the office. She denies current pain but reports she has occasional pelvic pain. Denies VB. She reports yesterday while at the pool she felt something trickle out of her vagina and down her leg. She reports later that evening while she was standing at the counter she felt another trickle of fluids down her leg that was clear.   Fetal movement clicker given.  Pain score: Denies pain.  FHT: 155 initial external Lab orders placed from triage: FMaryann AlarUA

## 2021-06-07 NOTE — MAU Provider Note (Cosign Needed Addendum)
Chief Complaint  Patient presents with   Rupture of Membranes   Decreased Fetal Movement   Pelvic Pain     Event Date/Time   First Provider Initiated Contact with Patient 06/07/21 1355      S: Latoya Stark  is a 26 y.o. y.o. year old G17P1061 female at 70w3dweeks gestation who presents to MAU reporting decreased fetal movement since yesterday morning. She was seen by Dr. CElly Modenathis morning for a regularly scheduled follow up, she reported decreased movement and a non-reactive NST was noted in the office and she was sent for additional evaluation. She denies any bleeding or pain at this time.   Of note, she also reports feeling some fluid trickling down her leg two separate times yesterday. Denies any loss of fluid today.   Contractions: Report some intermittent contractions at night, none currently Vaginal bleeding: Denies Leaking of fluid: Two episodes yesterday 06/06/2021  Patient Active Problem List   Diagnosis Date Noted   Seizure disorder during pregnancy in third trimester ([place] 05/18/2021   Cramping affecting pregnancy, antepartum 04/28/2021   Gestational diabetes mellitus (GDM) in third trimester 04/19/2021   Poor fetal growth affecting management of mother in second trimester 04/19/2021   Supervision of high risk pregnancy, antepartum 12/13/2020   Left leg numbness 09/28/2020   Left leg weakness 09/28/2020   Chronic migraine without aura without status migrainosus, not intractable 06/22/2020   Maternal morbid obesity, antepartum (HF[place] 06/22/2020   BMI 50.0-59.9, adult (HTo[place] 02/20/2019   Conversion disorder 02/10/2019   Todd's paralysis (HBoulder [place] 02/10/2019   Seizure disorder in pregnancy (HD[place] 11/23/2018   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HCent[place] 08/01/2018   Chronic migraine 08/01/2018   History of seizure 07/30/2018    O: Patient Vitals for the past 24 hrs:  BP Temp Temp src Pulse Resp SpO2  06/07/21 1557 112/68 -- -- (!) 104 19 --  06/07/21 1322 116/64 -- -- (!) 110 --  95 %  06/07/21 1249 109/78 98.5 F (36.9 C) Oral (!) 113 16 98 %   General: NAD Heart: Regular rate Lungs: Normal rate and effort Abd: Soft, NT, Gravid, S=D Pelvic: NEFG, no pooling, no blood.  Dilation: Fingertip Cervical Position: Posterior Exam by: VManya Silvas CNM. Caralyn Hudson, PA-Student  NST performed EFM: 132, mod variability, 15x15 accels, no decels Toco: None  Fern negative x 2  A: 348w3deek IUP Decreased fetal movement for the last 2 days with normal NST this afternoon.  2. Decreased fetal movement, third trimester, fetus 1   3. No leakage of amniotic fluid into vagina   4. Seizure disorder during pregnancy, third trimester (HCKutz[place]  5. Oral hypoglycemic controlled White classification A2 gestational diabetes mellitus (GDM)   6. [redacted] weeks gestation of pregnancy    P: Discharge home in stable condition. Labor precautions and fetal kick counts. Follow-up as scheduled for prenatal visit or sooner as needed if symptoms worsen. Return to maternity admissions as needed if symptoms worsen.  [id number] Halifax LaneCaManhattanStIllinois[place]iana/30/2023 5:32 PM  I was present for the exam and agree with above. BPP 10/10. Fetus now active.   SmGarantViVermontCNGlidden/30/2023 8:25 PM

## 2021-06-07 NOTE — Progress Notes (Signed)
PRENATAL VISIT NOTE  Subjective:  Latoya Stark is a 26 y.o. [phone] at 68w3dbeing seen today for ongoing prenatal care.  She is currently monitored for the following issues for this high-risk pregnancy and has History of seizure; 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HB[place]; Chronic migraine; Seizure disorder in pregnancy ([facility]; Conversion disorder; Todd's paralysis (HNeed[place]; BMI 50.0-59.9, adult (HDaniel; Chronic migraine without aura without status migrainosus, not intractable; Maternal morbid obesity, antepartum (HR[place]; Left leg numbness; Left leg weakness; Supervision of high risk pregnancy, antepartum; Gestational diabetes mellitus (GDM) in third trimester; Poor fetal growth affecting management of mother in second trimester; Cramping affecting pregnancy, antepartum; and Seizure disorder during pregnancy in third trimester (HKah[place] on their problem list.  Patient reports no complaints.  Contractions: Irritability. Vag. Bleeding: None.  Movement: (!) Decreased. Denies leaking of fluid.   The following portions of the patient's history were reviewed and updated as appropriate: allergies, current medications, past family history, past medical history, past social history, past surgical history and problem list.   Objective:   Vitals:   06/07/21 1035  BP: 120/83  Pulse: (!) 106  Weight: (!) 332 lb (150.6 kg)    Fetal Status: Fetal Heart Rate (bpm): NST   Movement: (!) Decreased     General:  Alert, oriented and cooperative. Patient is in no acute distress.  Skin: Skin is warm and dry. No rash noted.   Cardiovascular: Normal heart rate noted  Respiratory: Normal respiratory effort, no problems with respiration noted  Abdomen: Soft, gravid, appropriate for gestational age.  Pain/Pressure: Present     Pelvic: Cervical exam deferred        Extremities: Normal range of motion.  Edema: Trace  Mental Status: Normal mood and affect. Normal behavior. Normal judgment and thought content.   Assessment and Plan:   Pregnancy: [id number] 371w3d. Supervision of high risk pregnancy, antepartum Patient reports decreased fetal movement over the past 2 days. Patient admits to increased movement while on the monitor NST reviewed and non reactive with baseline 135, mod variability, no accels, no decels Patient sent to MAU for further evaluation  2. Gestational diabetes mellitus (GDM) in third trimester controlled on oral hypoglycemic drug Patient did not bring log book and reports fasting as high as 95 and pp between 120-125 Continue metformin Patient advised to bring CBG log at next visit Continue weekly BPP/NST Plan for IOL at 39 weeks or sooner if indicated  3. Seizure disorder during pregnancy in third trimester (HCOelweinNo seizure since last visit Continue current medications at current dosage  4. Maternal morbid obesity, antepartum (HCWalters  5. Poor fetal growth affecting management of mother in second trimester, single or unspecified fetus EFW 5/15 2290 gm (59%tile)  Preterm labor symptoms and general obstetric precautions including but not limited to vaginal bleeding, contractions, leaking of fluid and fetal movement were reviewed in detail with the patient. Please refer to After Visit Summary for other counseling recommendations.   Return in about 1 week (around 06/14/2021) for in person, ROB, High risk.  Future Appointments  Date Time Provider [place]5/31/2023  8:30 AM [facility]5/31/2023  8:45 AM [facility] US6 [facility]6/05/2021  1:30 PM [facility] NURSE [facility]6/05/2021  1:45 PM [facility] US5 [facility]6/07/2021 11:15 AM PrDonnamae JudeMD [facility]  06/20/2021 12:30 PM [facility] NURSE [facility]6/12/2021 12:45 PM [facility] US5 [facility]6/14/2023 10:55 AM PiAletha HalimMD  [facility]  06/24/2021 10:15 AM Teague Bobbye Morton, PA-C [facility]  06/30/2021 11:15 AM Aletha Halim, MD [facility]   07/05/2021 10:35 AM Anyanwu, Sallyanne Havers, MD [facility]  09/21/2021 11:00 AM Lomax, Amy, NP [facility] None    Mora Bellman, MD

## 2021-06-08 ENCOUNTER — Telehealth: Payer: Self-pay

## 2021-06-08 ENCOUNTER — Ambulatory Visit: Payer: Medicaid Other

## 2021-06-08 NOTE — Telephone Encounter (Signed)
Attempt to call patient about cancelled BPP ultrasound today 06/08/21.  No answer and mailbox is full.  Sent My Chart message.

## 2021-06-13 ENCOUNTER — Ambulatory Visit: Payer: Medicaid Other | Attending: Obstetrics and Gynecology

## 2021-06-13 ENCOUNTER — Ambulatory Visit: Payer: Medicaid Other | Admitting: *Deleted

## 2021-06-13 VITALS — BP 117/91 | HR 106

## 2021-06-13 DIAGNOSIS — E669 Obesity, unspecified: Secondary | ICD-10-CM

## 2021-06-13 DIAGNOSIS — O24415 Gestational diabetes mellitus in pregnancy, controlled by oral hypoglycemic drugs: Secondary | ICD-10-CM

## 2021-06-13 DIAGNOSIS — R638 Other symptoms and signs concerning food and fluid intake: Secondary | ICD-10-CM | POA: Insufficient documentation

## 2021-06-13 DIAGNOSIS — O0990 Supervision of high risk pregnancy, unspecified, unspecified trimester: Secondary | ICD-10-CM

## 2021-06-13 DIAGNOSIS — O99353 Diseases of the nervous system complicating pregnancy, third trimester: Secondary | ICD-10-CM | POA: Insufficient documentation

## 2021-06-13 DIAGNOSIS — G40909 Epilepsy, unspecified, not intractable, without status epilepticus: Secondary | ICD-10-CM | POA: Insufficient documentation

## 2021-06-13 DIAGNOSIS — O24419 Gestational diabetes mellitus in pregnancy, unspecified control: Secondary | ICD-10-CM | POA: Diagnosis present

## 2021-06-13 DIAGNOSIS — O365930 Maternal care for other known or suspected poor fetal growth, third trimester, not applicable or unspecified: Secondary | ICD-10-CM

## 2021-06-13 DIAGNOSIS — O99213 Obesity complicating pregnancy, third trimester: Secondary | ICD-10-CM

## 2021-06-13 DIAGNOSIS — Z3A36 36 weeks gestation of pregnancy: Secondary | ICD-10-CM

## 2021-06-15 ENCOUNTER — Ambulatory Visit (INDEPENDENT_AMBULATORY_CARE_PROVIDER_SITE_OTHER): Payer: Medicaid Other | Admitting: Family Medicine

## 2021-06-15 ENCOUNTER — Other Ambulatory Visit (HOSPITAL_COMMUNITY)
Admission: RE | Admit: 2021-06-15 | Discharge: 2021-06-15 | Disposition: A | Discharge status: Home / Self Care | Payer: Medicaid Other | Source: Ambulatory Visit | Attending: Family Medicine | Admitting: Family Medicine

## 2021-06-15 VITALS — BP 126/82 | HR 102 | Wt 337.0 lb

## 2021-06-15 DIAGNOSIS — O365920 Maternal care for other known or suspected poor fetal growth, second trimester, not applicable or unspecified: Secondary | ICD-10-CM

## 2021-06-15 DIAGNOSIS — O24415 Gestational diabetes mellitus in pregnancy, controlled by oral hypoglycemic drugs: Secondary | ICD-10-CM

## 2021-06-15 DIAGNOSIS — O99353 Diseases of the nervous system complicating pregnancy, third trimester: Secondary | ICD-10-CM

## 2021-06-15 DIAGNOSIS — G40909 Epilepsy, unspecified, not intractable, without status epilepticus: Secondary | ICD-10-CM

## 2021-06-15 DIAGNOSIS — O0990 Supervision of high risk pregnancy, unspecified, unspecified trimester: Secondary | ICD-10-CM

## 2021-06-15 DIAGNOSIS — G43709 Chronic migraine without aura, not intractable, without status migrainosus: Secondary | ICD-10-CM

## 2021-06-15 DIAGNOSIS — J4541 Moderate persistent asthma with (acute) exacerbation: Secondary | ICD-10-CM

## 2021-06-15 DIAGNOSIS — O99210 Obesity complicating pregnancy, unspecified trimester: Secondary | ICD-10-CM

## 2021-06-15 MED ORDER — CETIRIZINE HCL 10 MG PO TABS
10.0000 mg | ORAL_TABLET | Freq: Every day | ORAL | 1 refills | Status: DC
Start: 2021-06-15 — End: 2021-06-24

## 2021-06-15 MED ORDER — BUDESONIDE-FORMOTEROL FUMARATE 80-4.5 MCG/ACT IN AERO: 2.0000 | INHALATION_SPRAY | Freq: Two times a day (BID) | RESPIRATORY_TRACT | 12 refills | Status: DC

## 2021-06-15 NOTE — Progress Notes (Signed)
ROB [redacted]w[redacted]d Pt would like cervix check.  CC: swelling in hands and feet.Nausea taking Rx to help

## 2021-06-15 NOTE — Progress Notes (Signed)
PRENATAL VISIT NOTE  Subjective:  Latoya Stark is a 26 y.o. [phone] at 13w4dbeing seen today for ongoing prenatal care.  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  Patient reports no complaints.  Contractions: Irritability. Vag. Bleeding: None.  Movement: Present. Denies leaking of fluid.   The following portions of the patient's history were reviewed and updated as appropriate: allergies, current medications, past family history, past medical history, past social history, past surgical history and problem list.   Objective:   Vitals:   06/15/21 1120  BP: 126/82  Pulse: (!) 102  Weight: (!) 337 lb (152.9 kg)    Fetal Status: Fetal Heart Rate (bpm): 138 Fundal Height: 36 cm Movement: Present  Presentation: Vertex  General:  Alert, oriented and cooperative. Patient is in no acute distress.  Skin: Skin is warm and dry. No rash noted.   Cardiovascular: Normal heart rate noted  Respiratory: Normal respiratory effort, no problems with respiration noted  Abdomen: Soft, gravid, appropriate for gestational age.  Pain/Pressure: Present     Pelvic: Cervical exam performed in the presence of a chaperone Dilation: 1 Effacement (%): Thick Station: Ballotable  Extremities: Normal range of motion.  Edema: Trace  Mental Status: Normal mood and affect. Normal behavior. Normal judgment and thought content.   Assessment and Plan:  Pregnancy: [id number] 361w4d. 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 Supervision of high  risk pregnancy, antepartum Cultures  today - Strep Gp B Culture+Rflx - Cervicovaginal ancillary only( [place])  3. Gestational diabetes mellitus (GDM) in third trimester controlled on oral hypoglycemic drug FBS 98/97/95/100 2hr pp 116/94/130/126/120/120/98/120/129/100/126/107/125/110/105/100/123 Continue Metformin Add bedtime snack with protein to improve her fastings.  4. Poor fetal growth affecting management of mother in second trimester, single or unspecified fetus Last growth at 59% Has antenatal testing with MFM--next growth in 1 weeks  5. Maternal morbid obesity, antepartum (HCCaldwellIn testing  6. Chronic migraine without aura without status migrainosus, not intractable   7. Moderate persistent asthma with acute exacerbation Using Albuterol q 4-6 hours-- Add anti-histamine and inhaled steroid - cetirizine (ZYRTEC ALLERGY) 10 MG tablet; Take 1 tablet (10 mg total) by mouth daily.  Dispense: 90 tablet; Refill: 1 - budesonide-formoterol (SYMBICORT) 80-4.5 MCG/ACT inhaler; Inhale 2 puffs into the lungs in the morning and at bedtime.  Dispense: 1 each; Refill: 12  Preterm labor symptoms and general obstetric precautions including but not limited to vaginal bleeding, contractions, leaking of fluid and fetal movement were reviewed in detail with the patient. Please refer to After Visit Summary for other counseling recommendations.   No follow-ups on file.  Future Appointments  Date Time Provider [place]6/12/2021 12:30 PM WMC-MFC NURSE [facility]6/12/2021 12:45 PM WMC-MFC US5 [facility]MFCUS [facility]6/14/2023 10:55 AM PiAletha HalimMD C[facility]WSCA [facility]  06/24/2021 10:15 AM Teague ClBobbye MortonPA-C [facility]WSC[facility]  06/27/2021  1:15 PM WMC-WOCA NST [facility]6/22/2023 11:15 AM PiAletha HalimMD [facility]-WSCA [facility]  07/02/2021 12:00 AM MC-LD SCHED ROOM MC-INDC None  07/04/2021  1:15 PM WMC-WOCA NST [facility]6/27/2023 10:35 AM Anyanwu, UgLaray Anger  A, MD  C[facility]WSCA [facility]  09/21/2021 11:00 AM Lomax, Amy, NP GNA-GNA None    Donnamae Jude, MD

## 2021-06-16 ENCOUNTER — Telehealth: Payer: Self-pay | Admitting: *Deleted

## 2021-06-16 LAB — CERVICOVAGINAL ANCILLARY ONLY
Chlamydia: NEGATIVE
Comment: NEGATIVE
Comment: NORMAL
Neisseria Gonorrhea: NEGATIVE

## 2021-06-16 NOTE — Telephone Encounter (Signed)
Attempted to return patients call from voicemail. Voicemail box full. Will send mychart message

## 2021-06-17 ENCOUNTER — Telehealth (HOSPITAL_COMMUNITY): Payer: Self-pay | Admitting: *Deleted

## 2021-06-17 ENCOUNTER — Encounter (HOSPITAL_COMMUNITY): Payer: Self-pay | Admitting: *Deleted

## 2021-06-17 NOTE — Telephone Encounter (Signed)
Preadmission screen  

## 2021-06-19 LAB — STREP GP B CULTURE+RFLX: Strep Gp B Culture+Rflx: NEGATIVE

## 2021-06-20 ENCOUNTER — Ambulatory Visit: Payer: Medicaid Other | Admitting: *Deleted

## 2021-06-20 ENCOUNTER — Other Ambulatory Visit: Payer: Self-pay | Admitting: Obstetrics and Gynecology

## 2021-06-20 ENCOUNTER — Inpatient Hospital Stay (HOSPITAL_COMMUNITY)
Admission: AD | Admit: 2021-06-20 | Discharge: 2021-06-24 | DRG: 806 | Disposition: A | Discharge status: Home / Self Care | Payer: Medicaid Other | Attending: Obstetrics & Gynecology | Admitting: Obstetrics & Gynecology

## 2021-06-20 ENCOUNTER — Other Ambulatory Visit: Payer: Self-pay

## 2021-06-20 ENCOUNTER — Ambulatory Visit (HOSPITAL_BASED_OUTPATIENT_CLINIC_OR_DEPARTMENT_OTHER): Payer: Medicaid Other | Admitting: Obstetrics

## 2021-06-20 ENCOUNTER — Encounter (HOSPITAL_COMMUNITY): Payer: Self-pay | Admitting: Obstetrics & Gynecology

## 2021-06-20 ENCOUNTER — Ambulatory Visit (HOSPITAL_BASED_OUTPATIENT_CLINIC_OR_DEPARTMENT_OTHER): Payer: Medicaid Other

## 2021-06-20 ENCOUNTER — Encounter: Payer: Self-pay | Admitting: *Deleted

## 2021-06-20 VITALS — BP 132/63 | HR 122

## 2021-06-20 DIAGNOSIS — O24425 Gestational diabetes mellitus in childbirth, controlled by oral hypoglycemic drugs: Secondary | ICD-10-CM | POA: Diagnosis present

## 2021-06-20 DIAGNOSIS — O0990 Supervision of high risk pregnancy, unspecified, unspecified trimester: Secondary | ICD-10-CM

## 2021-06-20 DIAGNOSIS — Z3A37 37 weeks gestation of pregnancy: Secondary | ICD-10-CM

## 2021-06-20 DIAGNOSIS — O99353 Diseases of the nervous system complicating pregnancy, third trimester: Secondary | ICD-10-CM | POA: Insufficient documentation

## 2021-06-20 DIAGNOSIS — O99214 Obesity complicating childbirth: Secondary | ICD-10-CM | POA: Diagnosis present

## 2021-06-20 DIAGNOSIS — Z91011 Allergy to milk products: Secondary | ICD-10-CM

## 2021-06-20 DIAGNOSIS — O288 Other abnormal findings on antenatal screening of mother: Secondary | ICD-10-CM

## 2021-06-20 DIAGNOSIS — Z882 Allergy status to sulfonamides status: Secondary | ICD-10-CM

## 2021-06-20 DIAGNOSIS — O24415 Gestational diabetes mellitus in pregnancy, controlled by oral hypoglycemic drugs: Secondary | ICD-10-CM | POA: Diagnosis present

## 2021-06-20 DIAGNOSIS — O99213 Obesity complicating pregnancy, third trimester: Secondary | ICD-10-CM

## 2021-06-20 DIAGNOSIS — R200 Anesthesia of skin: Secondary | ICD-10-CM | POA: Diagnosis not present

## 2021-06-20 DIAGNOSIS — O24419 Gestational diabetes mellitus in pregnancy, unspecified control: Secondary | ICD-10-CM

## 2021-06-20 DIAGNOSIS — Z7984 Long term (current) use of oral hypoglycemic drugs: Secondary | ICD-10-CM

## 2021-06-20 DIAGNOSIS — Z91040 Latex allergy status: Secondary | ICD-10-CM | POA: Diagnosis not present

## 2021-06-20 DIAGNOSIS — Z880 Allergy status to penicillin: Secondary | ICD-10-CM | POA: Diagnosis not present

## 2021-06-20 DIAGNOSIS — Z79899 Other long term (current) drug therapy: Secondary | ICD-10-CM | POA: Diagnosis not present

## 2021-06-20 DIAGNOSIS — G40409 Other generalized epilepsy and epileptic syndromes, not intractable, without status epilepticus: Secondary | ICD-10-CM | POA: Diagnosis present

## 2021-06-20 DIAGNOSIS — Z885 Allergy status to narcotic agent status: Secondary | ICD-10-CM | POA: Diagnosis not present

## 2021-06-20 DIAGNOSIS — J45909 Unspecified asthma, uncomplicated: Secondary | ICD-10-CM | POA: Diagnosis present

## 2021-06-20 DIAGNOSIS — Z9102 Food additives allergy status: Secondary | ICD-10-CM | POA: Diagnosis not present

## 2021-06-20 DIAGNOSIS — O283 Abnormal ultrasonic finding on antenatal screening of mother: Secondary | ICD-10-CM | POA: Insufficient documentation

## 2021-06-20 DIAGNOSIS — Z91018 Allergy to other foods: Secondary | ICD-10-CM | POA: Diagnosis not present

## 2021-06-20 DIAGNOSIS — O9952 Diseases of the respiratory system complicating childbirth: Secondary | ICD-10-CM | POA: Diagnosis present

## 2021-06-20 DIAGNOSIS — G40909 Epilepsy, unspecified, not intractable, without status epilepticus: Secondary | ICD-10-CM

## 2021-06-20 DIAGNOSIS — Z87898 Personal history of other specified conditions: Secondary | ICD-10-CM

## 2021-06-20 DIAGNOSIS — E669 Obesity, unspecified: Secondary | ICD-10-CM

## 2021-06-20 DIAGNOSIS — O164 Unspecified maternal hypertension, complicating childbirth: Secondary | ICD-10-CM | POA: Diagnosis present

## 2021-06-20 DIAGNOSIS — R638 Other symptoms and signs concerning food and fluid intake: Secondary | ICD-10-CM | POA: Insufficient documentation

## 2021-06-20 DIAGNOSIS — Z3490 Encounter for supervision of normal pregnancy, unspecified, unspecified trimester: Secondary | ICD-10-CM | POA: Diagnosis present

## 2021-06-20 DIAGNOSIS — O99354 Diseases of the nervous system complicating childbirth: Secondary | ICD-10-CM | POA: Diagnosis present

## 2021-06-20 DIAGNOSIS — O365930 Maternal care for other known or suspected poor fetal growth, third trimester, not applicable or unspecified: Secondary | ICD-10-CM

## 2021-06-20 DIAGNOSIS — Z7951 Long term (current) use of inhaled steroids: Secondary | ICD-10-CM | POA: Diagnosis not present

## 2021-06-20 LAB — TYPE AND SCREEN
ABO/RH(D): AB POS
Antibody Screen: NEGATIVE

## 2021-06-20 LAB — CBC
HCT: 34 % — ABNORMAL LOW (ref 36.0–46.0)
Hemoglobin: 11.2 g/dL — ABNORMAL LOW (ref 12.0–15.0)
MCH: 28.9 pg (ref 26.0–34.0)
MCHC: 32.9 g/dL (ref 30.0–36.0)
MCV: 87.6 fL (ref 80.0–100.0)
Platelets: 293 10*3/uL (ref 150–400)
RBC: 3.88 MIL/uL (ref 3.87–5.11)
RDW: 13.2 % (ref 11.5–15.5)
WBC: 6.1 10*3/uL (ref 4.0–10.5)
nRBC: 0 % (ref 0.0–0.2)

## 2021-06-20 LAB — GLUCOSE, CAPILLARY: Glucose-Capillary: 78 mg/dL (ref 70–99)

## 2021-06-20 MED ORDER — SOD CITRATE-CITRIC ACID 500-334 MG/5ML PO SOLN: 30.0000 mL | ORAL | Status: DC | PRN

## 2021-06-20 MED ORDER — METFORMIN HCL 500 MG PO TABS
500.0000 mg | ORAL_TABLET | Freq: Two times a day (BID) | ORAL | Status: DC
  Administered 2021-06-20: 500 mg via ORAL
  Filled 2021-06-20 (×5): qty 1

## 2021-06-20 MED ORDER — FENTANYL-BUPIVACAINE-NACL 0.5-0.125-0.9 MG/250ML-% EP SOLN
12.0000 mL/h | EPIDURAL | Status: DC | PRN
  Filled 2021-06-20: qty 250

## 2021-06-20 MED ORDER — DIPHENHYDRAMINE HCL 50 MG/ML IJ SOLN: 12.5000 mg | INTRAMUSCULAR | Status: DC | PRN

## 2021-06-20 MED ORDER — EPHEDRINE 5 MG/ML INJ
10.0000 mg | INTRAVENOUS | Status: DC | PRN
  Administered 2021-06-21: 10 mg via INTRAVENOUS
  Filled 2021-06-20 (×2): qty 5

## 2021-06-20 MED ORDER — LACTATED RINGERS IV SOLN
500.0000 mL | INTRAVENOUS | Status: DC | PRN
  Administered 2021-06-21: 500 mL via INTRAVENOUS

## 2021-06-20 MED ORDER — MISOPROSTOL 50MCG HALF TABLET
50.0000 ug | ORAL_TABLET | ORAL | Status: DC
Start: 2021-06-20 — End: 2021-06-20

## 2021-06-20 MED ORDER — OXYCODONE-ACETAMINOPHEN 5-325 MG PO TABS: 1.0000 | ORAL_TABLET | ORAL | Status: DC | PRN

## 2021-06-20 MED ORDER — LEVETIRACETAM 750 MG PO TABS
750.0000 mg | ORAL_TABLET | Freq: Two times a day (BID) | ORAL | Status: DC
  Filled 2021-06-20: qty 1

## 2021-06-20 MED ORDER — LAMOTRIGINE 25 MG PO TABS
225.0000 mg | ORAL_TABLET | Freq: Two times a day (BID) | ORAL | Status: DC
  Administered 2021-06-20 – 2021-06-21 (×3): 225 mg via ORAL
  Filled 2021-06-20 (×6): qty 1

## 2021-06-20 MED ORDER — LACTATED RINGERS IV SOLN: INTRAVENOUS | Status: DC

## 2021-06-20 MED ORDER — ACETAMINOPHEN 325 MG PO TABS
650.0000 mg | ORAL_TABLET | ORAL | Status: DC | PRN
  Administered 2021-06-20 – 2021-06-21 (×2): 650 mg via ORAL
  Filled 2021-06-20 (×2): qty 2

## 2021-06-20 MED ORDER — MISOPROSTOL 50MCG HALF TABLET
50.0000 ug | ORAL_TABLET | ORAL | Status: DC | PRN
  Administered 2021-06-20: 50 ug via BUCCAL
  Filled 2021-06-20: qty 1

## 2021-06-20 MED ORDER — PHENYLEPHRINE 80 MCG/ML (10ML) SYRINGE FOR IV PUSH (FOR BLOOD PRESSURE SUPPORT)
80.0000 ug | PREFILLED_SYRINGE | INTRAVENOUS | Status: DC | PRN
  Filled 2021-06-20: qty 10

## 2021-06-20 MED ORDER — OXYCODONE-ACETAMINOPHEN 5-325 MG PO TABS: 2.0000 | ORAL_TABLET | ORAL | Status: DC | PRN

## 2021-06-20 MED ORDER — LEVETIRACETAM 750 MG PO TABS
1500.0000 mg | ORAL_TABLET | Freq: Two times a day (BID) | ORAL | Status: DC
  Administered 2021-06-20 – 2021-06-21 (×3): 1500 mg via ORAL
  Filled 2021-06-20 (×6): qty 2

## 2021-06-20 MED ORDER — ONDANSETRON HCL 4 MG/2ML IJ SOLN
4.0000 mg | Freq: Four times a day (QID) | INTRAMUSCULAR | Status: DC | PRN
  Administered 2021-06-21 – 2021-06-22 (×2): 4 mg via INTRAVENOUS
  Filled 2021-06-20 (×2): qty 2

## 2021-06-20 MED ORDER — LACTATED RINGERS IV SOLN
500.0000 mL | Freq: Once | INTRAVENOUS | Status: AC
  Administered 2021-06-21: 500 mL via INTRAVENOUS

## 2021-06-20 MED ORDER — LIDOCAINE HCL (PF) 1 % IJ SOLN: 30.0000 mL | INTRAMUSCULAR | Status: DC | PRN

## 2021-06-20 MED ORDER — MISOPROSTOL 50MCG HALF TABLET
50.0000 ug | ORAL_TABLET | ORAL | Status: DC | PRN
  Administered 2021-06-20: 50 ug via ORAL
  Filled 2021-06-20: qty 1

## 2021-06-20 MED ORDER — MISOPROSTOL 25 MCG QUARTER TABLET: 25.0000 ug | ORAL_TABLET | ORAL | Status: DC

## 2021-06-20 MED ORDER — EPHEDRINE 5 MG/ML INJ
10.0000 mg | INTRAVENOUS | Status: AC | PRN
  Administered 2021-06-21 (×2): 10 mg via INTRAVENOUS

## 2021-06-20 MED ORDER — ZOLPIDEM TARTRATE 5 MG PO TABS
5.0000 mg | ORAL_TABLET | Freq: Every evening | ORAL | Status: DC | PRN
  Administered 2021-06-20: 5 mg via ORAL
  Filled 2021-06-20: qty 1

## 2021-06-20 MED ORDER — OXYTOCIN-SODIUM CHLORIDE 30-0.9 UT/500ML-% IV SOLN
2.5000 [IU]/h | INTRAVENOUS | Status: DC
  Filled 2021-06-20: qty 500

## 2021-06-20 MED ORDER — OXYTOCIN BOLUS FROM INFUSION
333.0000 mL | Freq: Once | INTRAVENOUS | Status: AC
  Administered 2021-06-22: 333 mL via INTRAVENOUS

## 2021-06-20 MED ORDER — PHENYLEPHRINE 80 MCG/ML (10ML) SYRINGE FOR IV PUSH (FOR BLOOD PRESSURE SUPPORT)
80.0000 ug | PREFILLED_SYRINGE | INTRAVENOUS | Status: AC | PRN
Start: 2021-06-20 — End: 2021-06-21
  Administered 2021-06-21 (×3): 80 ug via INTRAVENOUS

## 2021-06-20 NOTE — Procedures (Signed)
Latoya Stark 03-13-1995 [redacted]w[redacted]d Fetus A Non-Stress Test Interpretation for 06/20/21  Indication: Unsatisfactory BPP  Fetal Heart Rate A Mode: External Baseline Rate (A): 140 bpm Variability: Minimal Accelerations: 15 x 15 Decelerations: None Multiple birth?: No  Uterine Activity Mode: Palpation, Toco Contraction Frequency (min): 1 uc with ui Contraction Duration (sec): 70 Contraction Quality: Mild Resting Tone Palpated: Relaxed Resting Time: Adequate  Interpretation (Fetal Testing) Nonstress Test Interpretation: Reactive Overall Impression: Reassuring for gestational age Comments: Dr. FAnnamaria Bootsreviewed tracing. [facility]

## 2021-06-20 NOTE — MAU Provider Note (Signed)
History     CSN: [id number]  Arrival date and time: 06/20/21 1454   None     No chief complaint on file.  HPI This is a 26 year old G3 P1-0-6-1 at 37 weeks and 2 days.  Patient sent over from MFM for BPP that was 4 out of 8.  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   Headache    Obesity    Seizures ([place])    "when I get too hot"   Trichomonas infection    UTI (lower urinary tract infection)     Past Surgical History:  Procedure Laterality Date   ADENOIDECTOMY     DILATION AND EVACUATION N/A 08/09/2013   Procedure: DILATATION AND EVACUATION;  Surgeon: Frederico Hamman, MD;  Location: [facility];  Service: Gynecology;  Laterality: N/A;   ORTHOPEDIC SURGERY Left    left hand   TONSILLECTOMY      Family History  Problem Relation Age of Onset   Heart disease Mother    Cancer Mother        Bone cancer   Seizures Mother    Stroke Mother    Sickle cell anemia Mother    Hypertension Father    Seizures Maternal Aunt    Diabetes Maternal Grandmother    Colon cancer Maternal Grandmother    Hypertension Maternal Grandmother    Clotting disorder Maternal Grandmother    Breast cancer Maternal Grandmother    Stroke Maternal Grandmother    Diabetes Paternal Grandmother     Social History   Tobacco Use   Smoking status: Never   Smokeless tobacco: Never  Vaping Use   Vaping Use: Never used  Substance Use Topics   Alcohol use: Not Currently    Comment: occ   Drug use: No    Allergies:  Allergies  Allergen Reactions   Hydrocodone Hives   Milk-Related  Compounds Dermatitis    Breaks face out *can have 2% milk*   Mushroom Extract Complex Anaphylaxis   Penicillins Anaphylaxis    Has patient had a PCN reaction causing immediate rash, facial/tongue/throat swelling, SOB or lightheadedness with hypotension: Yes Has patient had a PCN reaction causing severe rash involving mucus membranes or skin necrosis: No Has patient had a PCN reaction that required hospitalization No Has patient had a PCN reaction occurring within the last 10 years: Yes If all of the above answers are "NO", then may proceed with Cephalosporin use.    Cheese Other (See Comments)    Breaks face out   Latex Itching and Swelling   Sulfa Antibiotics Swelling    Medications Prior to Admission  Medication Sig Dispense Refill Last Dose   Accu-Chek Softclix Lancets lancets 1 each by Other route 4 (four) times daily. Use as instructed 100 each 12  acetaminophen (TYLENOL) 500 MG tablet Take 1,000 mg by mouth every 6 (six) hours as needed for headache.      albuterol (PROVENTIL HFA;VENTOLIN HFA) 108 (90 Base) MCG/ACT inhaler Inhale 2 puffs into the lungs every 6 (six) hours as needed for wheezing or shortness of breath. 1 Inhaler 0    albuterol (VENTOLIN HFA) 108 (90 Base) MCG/ACT inhaler Inhale 1-2 puffs into the lungs every 6 (six) hours as needed for wheezing or shortness of breath. 6.7 g 0    Blood Glucose Monitoring Suppl (ACCU-CHEK GUIDE) w/Device KIT 1 Device by Does not apply route in the morning, at noon, in the evening, and at bedtime. 1 kit 0    Blood Pressure Monitoring (BLOOD PRESSURE CUFF) MISC 1 Device by Does not apply route once a week. 1 each 0    Blood Pressure Monitoring (BLOOD PRESSURE KIT) DEVI 1 Device by Does not apply route once a week. 1 each 0    budesonide-formoterol (SYMBICORT) 80-4.5 MCG/ACT inhaler Inhale 2 puffs into the lungs in the morning and at bedtime. 1 each 12    butalbital-acetaminophen-caffeine (FIORICET) 50-325-40 MG tablet Take 2 tablets by  mouth every 6 (six) hours as needed for headache. 20 tablet 0    cetirizine (ZYRTEC ALLERGY) 10 MG tablet Take 1 tablet (10 mg total) by mouth daily. 90 tablet 1    ferrous sulfate 325 (65 FE) MG tablet Take 1 tablet (325 mg total) by mouth 2 (two) times daily with a meal. 60 tablet 5    fluticasone (FLONASE) 50 MCG/ACT nasal spray Place 2 sprays into both nostrils daily. 9.9 mL 2    glucose blood (ACCU-CHEK GUIDE) test strip Use as instructed 100 each 12    lamoTRIgine (LAMICTAL) 200 MG tablet Take 1 tablet (200 mg total) by mouth 2 (two) times daily. 60 tablet 5    lamoTRIgine (LAMICTAL) 25 MG tablet Take 1 tablet (25 mg total) by mouth 2 (two) times daily. Take with 200 mg tablet for total dose of 252m two times daily. 60 tablet 5    levETIRAcetam (KEPPRA) 750 MG tablet Take 2 tablets (1,500 mg total) by mouth 2 (two) times daily. 120 tablet 11    metFORMIN (GLUCOPHAGE) 500 MG tablet Take 1 tablet (500 mg total) by mouth 2 (two) times daily with a meal. 60 tablet 5    metoCLOPramide (REGLAN) 10 MG tablet Take 1 tablet (10 mg total) by mouth every 6 (six) hours as needed for nausea. 30 tablet 2    ondansetron (ZOFRAN ODT) 4 MG disintegrating tablet Take 1 tablet (4 mg total) by mouth every 8 (eight) hours as needed for nausea or vomiting. 12 tablet 2    pantoprazole (PROTONIX) 40 MG tablet Take 1 tablet (40 mg total) by mouth 2 (two) times daily. 30 tablet 4    Prenatal Vit-Fe Fumarate-FA (PREPLUS) 27-1 MG TABS Take 1 tablet by mouth daily. 30 tablet 13    promethazine (PHENERGAN) 25 MG tablet Take 1 tablet (25 mg total) by mouth every 6 (six) hours as needed for nausea or vomiting. 30 tablet 2     Review of Systems Physical Exam   Blood pressure 123/73, pulse (!) 112, temperature 98 F (36.7 C), temperature source Oral, resp. rate 20, height '5\' 3"'  (1.6 m), weight (!) 151.9 kg, last menstrual period 10/02/2020, SpO2 99 %, unknown if currently breastfeeding.  Physical Exam Vitals reviewed.   Constitutional:      Appearance: Normal appearance.  Pulmonary:  Effort: Pulmonary effort is normal.  Skin:    General: Skin is warm and dry.     Capillary Refill: Capillary refill takes less than 2 seconds.  Neurological:     General: No focal deficit present.     Mental Status: She is alert.  Psychiatric:        Mood and Affect: Mood normal.        Behavior: Behavior normal.        Thought Content: Thought content normal.     MAU Course  Procedures NST reactive  MDM   Assessment and Plan  Admit to L&D  Truett Mainland 06/20/2021, 3:21 PM

## 2021-06-20 NOTE — H&P (Addendum)
OBSTETRIC ADMISSION HISTORY AND PHYSICAL  Latoya Stark is a 26 y.o. female [phone] with IUP at 48w2dby LMP presenting for IOL for abnormal BPP 4/8 earlier today. She reports +FMs, No LOF, no VB, no blurry vision or peripheral edema, and no RUQ pain. She endorses headaches but has a hx of migraines. She plans on breast and bottle feeding. She requests Nexplanon for birth control. [facility]  Dating: By LMP ---> Estimated Date of Delivery: 07/09/21  [place]ono 06/20/21:   34w2dCWD, normal anatomy, cephalic presentation, [id number] EFW, BPP 4/10  Prenatal History/Complications:   A2G9FAO controlled on Metformin, last BG 78  Seizure Disorder - On Lamictal and Keppra, last seizure ~1 month ago  IUGR, resolved- last growth estimate 86%ile today  Obesity- BMI 59  Past Medical History: Past Medical History:  Diagnosis Date   ADHD (attention deficit hyperactivity disorder)    Anemia    Anxiety    Asthma    Chronic migraine 08/01/2018   Depression    Gestational diabetes    Gonorrhea in female 10/05/2013   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HCHickory Ridge07/23/2020   Headache    Obesity    Seizures (HCRisingsun   "when I get too hot"   Trichomonas infection    UTI (lower urinary tract infection)     Past Surgical History: Past Surgical History:  Procedure Laterality Date   ADENOIDECTOMY     DILATION AND EVACUATION N/A 08/09/2013   Procedure: DILATATION AND EVACUATION;  Surgeon: BeFrederico HammanMD;  [facility];  Service: Gynecology;  Laterality: N/A;   ORTHOPEDIC SURGERY Left    left hand   TONSILLECTOMY      Obstetrical History: OB History     Gravida  8   Para  1   Term  1   Preterm      AB  6   Living  1      SAB  4   IAB  1   Ectopic      Multiple      Live Births  1           Social History Social History   Socioeconomic History   Marital status: Significant Other    Spouse name: Not on file   Number of children: 1    Years of education: Not on file   Highest education level: Not on file  Occupational History   Not on file  Tobacco Use   Smoking status: Never   Smokeless tobacco: Never  Vaping Use   Vaping Use: Never used  Substance and Sexual Activity   Alcohol use: Not Currently    Comment: occ   Drug use: No   Sexual activity: Not Currently    Partners: Male  Other Topics Concern   Not on file  Social History Narrative   Caffeine: tea sometimes   Lives with fiance   Right handed    Social Determinants of Health   Financial Resource Strain: Low Risk  (08/14/2018)   Overall Financial Resource Strain (CARDIA)    Difficulty of Paying Living Expenses: Not hard at all  Food Insecurity: Food Insecurity Present (08/14/2018)   Hunger Vital Sign    Worried About Running Out of Food in the Last Year: Sometimes true    Ran Out of Food in the Last Year: Sometimes true  Transportation Needs: No Transportation Needs (08/14/2018)   PRAPARE - Transportation    Lack  of Transportation (Medical): No    Lack of Transportation (Non-Medical): No  Physical Activity: Unknown (08/14/2018)   Exercise Vital Sign    Days of Exercise per Week: 0 days    Minutes of Exercise per Session: Not on file  Stress: No Stress Concern Present (08/14/2018)   [place]    Feeling of Stress : Not at all  Social Connections: Socially Isolated (08/14/2018)   Social Connection and Isolation Panel [NHANES]    Frequency of Communication with Friends and Family: More than three times a week    Frequency of Social Gatherings with Friends and Family: More than three times a week    Attends Religious Services: Never    Marine scientist or Organizations: No    Attends Music therapist: Never    Marital Status: Never married    Family History: Family History  Problem Relation Age of Onset   Heart disease Mother    Cancer Mother        Bone cancer    Seizures Mother    Stroke Mother    Sickle cell anemia Mother    Hypertension Father    Seizures Maternal Aunt    Diabetes Maternal Grandmother    Colon cancer Maternal Grandmother    Hypertension Maternal Grandmother    Clotting disorder Maternal Grandmother    Breast cancer Maternal Grandmother    Stroke Maternal Grandmother    Diabetes Paternal Grandmother     Allergies: Allergies  Allergen Reactions   Hydrocodone Hives   Milk-Related Compounds Dermatitis    Breaks face out *can have 2% milk*   Mushroom Extract Complex Anaphylaxis   Penicillins Anaphylaxis    Has patient had a PCN reaction causing immediate rash, facial/tongue/throat swelling, SOB or lightheadedness with hypotension: Yes Has patient had a PCN reaction causing severe rash involving mucus membranes or skin necrosis: No Has patient had a PCN reaction that required hospitalization No Has patient had a PCN reaction occurring within the last 10 years: Yes If all of the above answers are "NO", then may proceed with Cephalosporin use.    Cheese Other (See Comments)    Breaks face out   Latex Itching and Swelling   Sulfa Antibiotics Swelling    Medications Prior to Admission  Medication Sig Dispense Refill Last Dose   Accu-Chek Softclix Lancets lancets 1 each by Other route 4 (four) times daily. Use as instructed 100 each 12 06/20/2021   acetaminophen (TYLENOL) 500 MG tablet Take 1,000 mg by mouth every 6 (six) hours as needed for headache.   06/20/2021   albuterol (PROVENTIL HFA;VENTOLIN HFA) 108 (90 Base) MCG/ACT inhaler Inhale 2 puffs into the lungs every 6 (six) hours as needed for wheezing or shortness of breath. 1 Inhaler 0 06/20/2021   albuterol (VENTOLIN HFA) 108 (90 Base) MCG/ACT inhaler Inhale 1-2 puffs into the lungs every 6 (six) hours as needed for wheezing or shortness of breath. 6.7 g 0 06/20/2021   Blood Glucose Monitoring Suppl (ACCU-CHEK GUIDE) w/Device KIT 1 Device by Does not apply route in the  morning, at noon, in the evening, and at bedtime. 1 kit 0 06/20/2021   Blood Pressure Monitoring (BLOOD PRESSURE CUFF) MISC 1 Device by Does not apply route once a week. 1 each 0 06/20/2021   Blood Pressure Monitoring (BLOOD PRESSURE KIT) DEVI 1 Device by Does not apply route once a week. 1 each 0 06/20/2021   budesonide-formoterol (SYMBICORT) 80-4.5 MCG/ACT  inhaler Inhale 2 puffs into the lungs in the morning and at bedtime. 1 each 12 Past Week   butalbital-acetaminophen-caffeine (FIORICET) 50-325-40 MG tablet Take 2 tablets by mouth every 6 (six) hours as needed for headache. 20 tablet 0 06/20/2021   cetirizine (ZYRTEC ALLERGY) 10 MG tablet Take 1 tablet (10 mg total) by mouth daily. 90 tablet 1 06/20/2021   ferrous sulfate 325 (65 FE) MG tablet Take 1 tablet (325 mg total) by mouth 2 (two) times daily with a meal. 60 tablet 5 06/20/2021   fluticasone (FLONASE) 50 MCG/ACT nasal spray Place 2 sprays into both nostrils daily. 9.9 mL 2 06/20/2021   glucose blood (ACCU-CHEK GUIDE) test strip Use as instructed 100 each 12 06/20/2021   lamoTRIgine (LAMICTAL) 200 MG tablet Take 1 tablet (200 mg total) by mouth 2 (two) times daily. 60 tablet 5 06/20/2021   lamoTRIgine (LAMICTAL) 25 MG tablet Take 1 tablet (25 mg total) by mouth 2 (two) times daily. Take with 200 mg tablet for total dose of 252m two times daily. 60 tablet 5 06/20/2021   levETIRAcetam (KEPPRA) 750 MG tablet Take 2 tablets (1,500 mg total) by mouth 2 (two) times daily. 120 tablet 11 06/20/2021   metFORMIN (GLUCOPHAGE) 500 MG tablet Take 1 tablet (500 mg total) by mouth 2 (two) times daily with a meal. 60 tablet 5 06/20/2021   metoCLOPramide (REGLAN) 10 MG tablet Take 1 tablet (10 mg total) by mouth every 6 (six) hours as needed for nausea. 30 tablet 2 06/20/2021   ondansetron (ZOFRAN ODT) 4 MG disintegrating tablet Take 1 tablet (4 mg total) by mouth every 8 (eight) hours as needed for nausea or vomiting. 12 tablet 2 06/20/2021   pantoprazole (PROTONIX) 40  MG tablet Take 1 tablet (40 mg total) by mouth 2 (two) times daily. 30 tablet 4 06/20/2021   Prenatal Vit-Fe Fumarate-FA (PREPLUS) 27-1 MG TABS Take 1 tablet by mouth daily. 30 tablet 13 06/20/2021   promethazine (PHENERGAN) 25 MG tablet Take 1 tablet (25 mg total) by mouth every 6 (six) hours as needed for nausea or vomiting. 30 tablet 2 06/20/2021     Review of Systems   All systems reviewed and negative except as stated in HPI  Blood pressure 96/70, pulse (!) 116, temperature 98 F (36.7 C), temperature source Oral, resp. rate 18, height _0  (1.6 m), weight (!) 151.5 kg, last menstrual period 10/02/2020, SpO2 98 %, unknown if currently breastfeeding. General appearance: alert, cooperative, and no distress Lungs: clear to auscultation bilaterally, normal rate & effort  Heart: regular rate and rhythm Abdomen: gravid Pelvic: see SVE below Extremities: Homans sign is negative, no sign of DVT Presentation: cephalic Fetal monitoring: Baseline: 135 bpm, Variability: Good {> 6 bpm), Accelerations: Reactive, and Decelerations: Absent Uterine activity: None Dilation: 1 Effacement (%): Thick Exam by:: Evaristo Tsuda Vertex by BSUS  Prenatal labs: ABO, Rh: --/--/AB POS (06/12 1602) Antibody: NEG (06/12 1602) Rubella: 1.35 (12/22 1153) RPR: Non Reactive (03/28 1122)  HBsAg: Negative (12/22 1153)  HIV: Non Reactive (03/28 1122)  GBS: Negative/-- (06/07 1329)  2 hr Glucola: failed Genetic screening:  Normal Anatomy [place] Normal  Prenatal Transfer Tool  Maternal Diabetes: Yes:  Diabetes Type:  Insulin/Medication controlled on metformin Genetic Screening: Normal Maternal Ultrasounds/Referrals: Isolated choroid plexus cyst Fetal Ultrasounds or other Referrals:  Referred to Materal Fetal Medicine  Maternal Substance Abuse:  No Significant Maternal Medications:  Meds include: Other: Lamotrigine, Keppra Significant Maternal Lab Results: Group B Strep negative  Results for  orders placed or performed  during the hospital encounter of 06/20/21 (from the past 24 hour(s))  CBC   Collection Time: 06/20/21  4:02 PM  Result Value Ref Range   WBC 6.1 4.0 - 10.5 K/uL   RBC 3.88 3.87 - 5.11 MIL/uL   Hemoglobin 11.2 (L) 12.0 - 15.0 g/dL   HCT 34.0 (L) 36.0 - 46.0 %   MCV 87.6 80.0 - 100.0 fL   MCH 28.9 26.0 - 34.0 pg   MCHC 32.9 30.0 - 36.0 g/dL   RDW 13.2 11.5 - 15.5 %   Platelets 293 150 - 400 K/uL   nRBC 0.0 0.0 - 0.2 %  Type and screen [place]   Collection Time: 06/20/21  4:02 PM  Result Value Ref Range   ABO/RH(D) AB POS    Antibody Screen NEG    Sample Expiration      06/23/2021,2359 Performed at [facility], [place] [address] [place], [place] [address]   Glucose, capillary   Collection Time: 06/20/21  4:17 PM  Result Value Ref Range   Glucose-Capillary 78 70 - 99 mg/dL    Patient Active Problem List   Diagnosis Date Noted   Encounter for induction of labor 06/20/2021   Seizure disorder during pregnancy in third trimester ([place]) 05/18/2021   Gestational diabetes mellitus (GDM) in third trimester 04/19/2021   Supervision of high risk pregnancy, antepartum 12/13/2020   Chronic migraine without aura without status migrainosus, not intractable 06/22/2020   Maternal morbid obesity, antepartum ([place]) 06/22/2020   BMI 50.0-59.9, adult ([place]) 02/20/2019   Conversion disorder 02/10/2019   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 [id number] at 2w2dhere for IOL for non-reassuring fetal monitoring (BPP 4/8, reactive NST) earlier at MFM.   #Labor: IOL - vertex by bedside [place] Cytotec x1 at 1743. Consider FB at recheck. #Pain: Planning for Epidural #FWB: Cat 1 - Baseline HR 135 bpm, moderate variability, reactive, no decels #ID: GBS negative #MOF: breast feeding and bottle feeding #MOC: Nexplanon - plans to wait for implantation in the office #Circ: Yes - prior to discharge  #GDMA2-  continue home metformin, CBG q4hr while in latent labor.  BSully [place] 06/20/2021, 5:47 PM   Attestation of Supervision of Student:  I confirm that I have verified the information documented in the physician assistant student's note and that I have also personally performed the history, physical exam and all medical decision making activities.  I have verified that all services and findings are accurately documented in this student's note; and I agree with management and plan as outlined in the documentation. I have also made any necessary editorial changes.  MLenoria Chime MD [facility] 06/20/2021 6:02 PM

## 2021-06-20 NOTE — Progress Notes (Signed)
MFM Note  Latoya Stark was seen for a growth scan and BPP due to maternal obesity with a BMI of 34 and gestational diabetes treated with metformin.  She denies any problems since her last exam.  The overall EFW obtained today was 7 pounds 12 ounces (86 percentile).  There was normal amniotic fluid noted with a total AFI of 10.8 cm.    A BPP performed today was 4 out of 10.  Absent fetal breathing movements, absent fetal movements, and absent fetal tone were noted today.    She had a reactive NST. However, the NST showed minimal variability.    Due to a biophysical profile of 4 out of 10 at her current gestational age (63 weeks 2 days), delivery is recommended.    The patient was sent to labor and delivery immediately following today's ultrasound exam for induction.    The patient is happy and agreeable with delivery today.    A total of 20 minutes was spent counseling and coordinating the care for this patient.  Greater than 50% of the time was spent in direct face-to-face contact.

## 2021-06-20 NOTE — Progress Notes (Signed)
Patient Vitals for the past 4 hrs:  BP Pulse Resp  06/20/21 2142 107/60 (!) 117 (!) 32  06/20/21 2033 120/69 (!) 122 (!) 22  06/20/21 1920 103/64 (!) 108 --   FHR Cat 1, mild and irregular ctx.  Cx outer os 4cms but inner os 1cm/thick/soft/-3.  Foley inserted and inflated w/60cc H20.  2nd cytotec given orally. Blood sugar 78 at 1600, 157 after eating high carb food from [employer].  Instructed to stick to hospital food.

## 2021-06-20 NOTE — MAU Note (Signed)
Latoya Stark is a 26 y.o. at 14w2dhere in MAU reporting: sent in for direct adm.  BPP 4/10, off for breathing , movement and tone. No bleeding, leaking or pain.  Onset of complaint: today Pain score:  Vitals:   06/20/21 1518  BP: 123/73  Pulse: (!) 112  Resp: 20  Temp: 98 F (36.7 C)  SpO2: 99%     FHT:142 Lab orders placed from triage:  none

## 2021-06-21 ENCOUNTER — Inpatient Hospital Stay (HOSPITAL_COMMUNITY): Payer: Medicaid Other | Admitting: Anesthesiology

## 2021-06-21 LAB — GLUCOSE, CAPILLARY
Glucose-Capillary: 157 mg/dL — ABNORMAL HIGH (ref 70–99)
Glucose-Capillary: 79 mg/dL (ref 70–99)
Glucose-Capillary: 96 mg/dL (ref 70–99)
Glucose-Capillary: 97 mg/dL (ref 70–99)
Glucose-Capillary: 81 mg/dL (ref 70–99)

## 2021-06-21 LAB — RPR: RPR Ser Ql: NONREACTIVE

## 2021-06-21 MED ORDER — LACTATED RINGERS AMNIOINFUSION: INTRAVENOUS | Status: DC

## 2021-06-21 MED ORDER — LIDOCAINE HCL (PF) 1 % IJ SOLN
INTRAMUSCULAR | Status: DC | PRN
  Administered 2021-06-21 (×2): 4 mL via EPIDURAL

## 2021-06-21 MED ORDER — FENTANYL CITRATE (PF) 100 MCG/2ML IJ SOLN
INTRAMUSCULAR | Status: DC | PRN
  Administered 2021-06-21: 100 ug via EPIDURAL

## 2021-06-21 MED ORDER — FENTANYL CITRATE (PF) 100 MCG/2ML IJ SOLN
INTRAMUSCULAR | Status: AC
  Filled 2021-06-21: qty 2

## 2021-06-21 MED ORDER — TERBUTALINE SULFATE 1 MG/ML IJ SOLN
0.2500 mg | Freq: Once | INTRAMUSCULAR | Status: AC | PRN
  Administered 2021-06-21: 0.25 mg via SUBCUTANEOUS
  Filled 2021-06-21: qty 1

## 2021-06-21 MED ORDER — OXYTOCIN-SODIUM CHLORIDE 30-0.9 UT/500ML-% IV SOLN
1.0000 m[IU]/min | INTRAVENOUS | Status: DC
  Administered 2021-06-21 (×2): 2 m[IU]/min via INTRAVENOUS
  Filled 2021-06-21: qty 500

## 2021-06-21 MED ORDER — MISOPROSTOL 25 MCG QUARTER TABLET
25.0000 ug | ORAL_TABLET | Freq: Once | ORAL | Status: AC
  Administered 2021-06-21: 25 ug via VAGINAL
  Filled 2021-06-21: qty 1

## 2021-06-21 MED ORDER — FENTANYL-BUPIVACAINE-NACL 0.5-0.125-0.9 MG/250ML-% EP SOLN
EPIDURAL | Status: DC | PRN
  Administered 2021-06-21: 12 mL/h via EPIDURAL

## 2021-06-21 MED ORDER — FENTANYL CITRATE (PF) 100 MCG/2ML IJ SOLN
100.0000 ug | INTRAMUSCULAR | Status: DC | PRN
  Administered 2021-06-21: 100 ug via INTRAVENOUS

## 2021-06-21 MED ORDER — BUPIVACAINE HCL (PF) 0.25 % IJ SOLN
INTRAMUSCULAR | Status: DC | PRN
  Administered 2021-06-21: 4 mL via EPIDURAL

## 2021-06-21 NOTE — Progress Notes (Signed)
Patient Vitals for the past 4 hrs:  BP Temp Temp src Pulse Resp  06/21/21 0537 112/70 -- -- 93 (!) 26  06/21/21 0433 99/60 -- -- (!) 113 (!) 26  06/21/21 0328 129/62 98.5 F (36.9 C) Oral (!) 107 (!) 28   Blood sugar 96 FHR Cat 1, mild ctx. Cx 5/50/-3 per RN exam. Pitocin started at [place].

## 2021-06-21 NOTE — Anesthesia Preprocedure Evaluation (Signed)
Anesthesia Evaluation  Patient identified by MRN, date of birth, ID band Patient awake    Reviewed: Allergy & Precautions, Patient's Chart, lab work & pertinent test results  History of Anesthesia Complications Negative for: history of anesthetic complications  Airway Mallampati: II  TM Distance: >3 FB Neck ROM: Full    Dental no notable dental hx.    Pulmonary asthma ,    Pulmonary exam normal        Cardiovascular negative cardio ROS Normal cardiovascular exam     Neuro/Psych  Headaches, Seizures -, Poorly Controlled,  Anxiety Depression    GI/Hepatic negative GI ROS, Neg liver ROS,   Endo/Other  diabetes, Gestational, Oral Hypoglycemic AgentsMorbid obesity (BMI 59)  Renal/GU negative Renal ROS  negative genitourinary   Musculoskeletal negative musculoskeletal ROS (+)   Abdominal   Peds  Hematology  (+) Blood dyscrasia, anemia ,   Anesthesia Other Findings Day of surgery medications reviewed with patient.  Reproductive/Obstetrics (+) Pregnancy                             Anesthesia Physical Anesthesia Plan  ASA: 3  Anesthesia Plan: Epidural   Post-op Pain Management:    Induction:   PONV Risk Score and Plan: Treatment may vary due to age or medical condition  Airway Management Planned: Natural Airway  Additional Equipment: Fetal Monitoring  Intra-op Plan:   Post-operative Plan:   Informed Consent: I have reviewed the patients History and Physical, chart, labs and discussed the procedure including the risks, benefits and alternatives for the proposed anesthesia with the patient or authorized representative who has indicated his/her understanding and acceptance.       Plan Discussed with:   Anesthesia Plan Comments:         Anesthesia Quick Evaluation

## 2021-06-21 NOTE — Anesthesia Procedure Notes (Signed)
Epidural Patient location during procedure: OB Start time: 06/21/2021 7:46 PM End time: 06/21/2021 7:52 PM  Staffing Anesthesiologist: Brennan Bailey, MD Performed: anesthesiologist   Preanesthetic Checklist Completed: patient identified, IV checked, risks and benefits discussed, monitors and equipment checked, pre-op evaluation and timeout performed  Epidural Patient position: sitting Prep: DuraPrep and site prepped and draped Patient monitoring: continuous pulse ox, blood pressure and heart rate Approach: midline Location: L3-L4 Injection technique: LOR air  Needle:  Needle type: Tuohy  Needle gauge: 17 G Needle length: 9 cm Needle insertion depth: 8 cm Catheter type: closed end flexible Catheter size: 19 Gauge Catheter at skin depth: 13 cm Test dose: negative and Other (1% lidocaine)  Assessment Events: blood not aspirated, injection not painful, no injection resistance, no paresthesia and negative IV test  Additional Notes Patient identified. Risks, benefits, and alternatives discussed with patient including but not limited to bleeding, infection, nerve damage, paralysis, failed block, incomplete pain control, headache, blood pressure changes, nausea, vomiting, reactions to medication, itching, and postpartum back pain. Confirmed with bedside nurse the patient's most recent platelet count. Confirmed with patient that they are not currently taking any anticoagulation, have any bleeding history, or any family history of bleeding disorders. Patient expressed understanding and wished to proceed. All questions were answered. Sterile technique was used throughout the entire procedure. Please see nursing notes for vital signs.   Crisp LOR after 3 needle redirections, somewhat difficult due to habitus and patient inability to remain still during procedure. Test dose was given through epidural catheter and negative prior to continuing to dose epidural or start infusion. Warning signs  of high block given to the patient including shortness of breath, tingling/numbness in hands, complete motor block, or any concerning symptoms with instructions to call for help. Patient was given instructions on fall risk and not to get out of bed. All questions and concerns addressed with instructions to call with any issues or inadequate analgesia.  Reason for block:procedure for pain

## 2021-06-21 NOTE — Progress Notes (Signed)
Labor Progress Note Latoya Stark is a 26 y.o. [id number] at 48w3dpresented for IOL d/t BPP 4/8, in the setting of GDM.   S: Recently starting to feel more uncomfortable as pit goes up. About to walk the halls if she can.   O:  BP 135/77   Pulse (!) 113   Temp 98.8 F (37.1 C) (Oral)   Resp 20   Ht '5\' 3"'$  (1.6 m)   Wt (!) 151.5 kg   LMP 10/02/2020 (Approximate)   SpO2 98%   BMI 59.17 kg/m  EFM: 140/mod/15x15/none  CVE: Dilation: 5 Effacement (%): Thick Cervical Position: Posterior Station: -3 Presentation: Vertex Exam by:: Theresia Pree MD   A&P: 26y.o. [id number]#Labor: Largely unchanged and still very posterior. With exam, felt a small soft "notch" near the head with difficulty to feel if pulsating. Reassuringly becoming more uncomfortable with contractions-- hopefully with walking the halls and position changes, her station will improve enough for confident AROM.  #Pain: PRN  #FWB: Cat I  #GBS negative  SaPatriciaann ClanDO 2:10 PM

## 2021-06-21 NOTE — Inpatient Diabetes Management (Signed)
Inpatient Diabetes Program Recommendations  Diabetes Treatment Program Recommendations  ADA Standards of Care Diabetes in Pregnancy Target Glucose Ranges:  Fasting: 70 - 95 mg/dL 1 hr postprandial: Less than '140mg'$ /dL (from first bite of meal) 2 hr postprandial: Less than 120 mg/dL (from first bite of meal)     Latest Reference Range & Units 06/20/21 16:17 06/20/21 20:16 06/20/21 23:41 06/21/21 03:32  Glucose-Capillary 70 - 99 mg/dL 78 157 (H) 79 96   Review of Glycemic Control  Diabetes history: GDM; [redacted]W[redacted]D Outpatient Diabetes medications: Metformin 500 mg BID Current orders for Inpatient glycemic control: Metformin 500 mg BID  NOTE: Patient with hx of GDM on Metformin outpatient; admitted for IOL. Currently ordered Metformin and CBGs. Agree with current inpatient orders for glycemic control. Anticipate glucose to return to baseline following delivery. Inpatient diabetes team will follow along while inpatient.  Thanks, Barnie Alderman, RN, MSN, [place] Diabetes Coordinator Inpatient Diabetes Program [phone] (Team Pager from 8am to [place])

## 2021-06-21 NOTE — Progress Notes (Signed)
Labor Progress Note DENAJA VERHOEVEN is a 26 y.o. [id number] at 26w3dpresented for IOL d/t BPP 4/8, in setting of GDM.   S: Doing well. Contractions not very bothersome, just like "cramping".   O:  BP (!) 106/52   Pulse 94   Temp 98 F (36.7 C) (Oral)   Resp 20   Ht '5\' 3"'$  (1.6 m)   Wt (!) 151.5 kg   LMP 10/02/2020 (Approximate)   SpO2 98%   BMI 59.17 kg/m  EFM: 140/mod/15x15/none  CVE: Dilation: 5 Effacement (%): Thick Cervical Position: Posterior Station: -3 Presentation: Vertex Exam by:: Leesha Veno MD   A&P: 26y.o. [id number]#Labor: Largely unchanged since last check, cervix still very thick (but soft). Station -3 and difficult to reach. Plan to continue titrating pit as needed and recheck in the next 2 hours or so. Hopeful for AROM at that point as minimally able to go up on pit at this point (already having frequent ctx).  #Pain: PRN  #FWB: Cat I  #GBS negative  #GDM: CBGs appropriate.   SaPatriciaann ClanDO 11:32 AM

## 2021-06-21 NOTE — Progress Notes (Signed)
Patient Vitals for the past 4 hrs:  BP Pulse Resp  06/21/21 0134 116/64 (!) 114 (!) 28  06/21/21 0026 122/83 96 --  06/20/21 2340 (!) 107/55 93 (!) 30   Blood sugar 78.  Balloon out, cx 4/0/-3/soft.  FHR Cat 1. Ctx q 2-3 minutes, mild. Cytotec 69mg placed vaginally.  Will start pitocin in 4 hours.

## 2021-06-21 NOTE — Progress Notes (Signed)
Latoya Stark is a 26 y.o. [phone] at 93w3dby LMP admitted for IOL GDM  Subjective:S: Recently starting to feel more uncomfortable   Objective: BP 125/83   Pulse 100   Temp 98.1 F (36.7 C) (Oral)   Resp 17   Ht '5\' 3"'$  (1.6 m)   Wt (!) 151.5 kg   LMP 10/02/2020 (Approximate)   SpO2 98%   BMI 59.17 kg/m  No intake/output data recorded. No intake/output data recorded.   SVE:   Dilation: 6 Effacement (%): 60 Station: -3 Exam by:: Latoya Stark  Labs: Lab Results  Component Value Date   WBC 6.1 06/20/2021   HGB 11.2 (L) 06/20/2021   HCT 34.0 (L) 06/20/2021   MCV 87.6 06/20/2021   PLT 293 06/20/2021    Assessment / Plan: Spontaneous labor, progressing normally  Labor: Progressing normally Preeclampsia:  no signs or symptoms of toxicity Fetal Wellbeing:  Category I I/D:  n/a Anticipated MOD:  NSVD  Latoya Reeve6/13/2023, 6:34 PM

## 2021-06-21 NOTE — Progress Notes (Signed)
Delayed documentation  Labor Progress Note BRANDIE LOPES is a 26 y.o. [phone] at 9w3dpresented for IOL for BPP 4/10 and GDMA2 Patient recently with repetitive variable decelerations, pitocin turned off, terbutaline given and amnioinfusion started  S:  Patient reports she feels more comfortable after redosing of epidural. Denies feeling dizzy or lightheaded. Alert and talking on phone.  O:  BP (!) 110/52   Pulse (!) 116   Temp 97.9 F (36.6 C) (Oral)   Resp 18   Ht '5\' 3"'$  (1.6 m)   Wt (!) 151.5 kg   LMP 10/02/2020 (Approximate)   SpO2 99%   BMI 59.17 kg/m  EFM: 160/moerate variability/variable decels present but not with every contraction/no accels  CVE: Dilation: 6 Effacement (%): 80 Cervical Position: Posterior Station: -1 Presentation: Vertex Exam by:: Dr. DCy Blamer  A&P: 26y.o. [id number] for IOL for BPP 4/10 and GDMA2 Cervix 6/80/-1 (dilation same as before, effacement and station progressed). Initially while in room Cat II tracing with variable decels - improving with amnioinfusion and after terb given and pitocin stopped. Also with soft BP after epidural and redose but improved with phenylephrine X3 and then ephedrine.   While in room two large variables to the 60s while patient on right side that improved with position change. Will plan to leave in current position as no longer having decels. Does seem to have minimal variability at this time but likely recovering after formerly mentioned decels. Will give IV fluid bolus and keep pitocin off for now. Reassess in 30 mins and if tracing still improved will plan to restart pitocin 2x2   #A2GDM Switch to q2hr checks given active labor. So far all BG within goal   [name], MPH OB Fellow, Faculty Practice

## 2021-06-21 NOTE — Progress Notes (Addendum)
Assessed patient at bedside and went to meet her  Feeling contractions on left side and right side numb.  While in room difficult to trace contractions and fetal heart tones and therefore discussed FSE and IUPC with patient. Patient amenable and IUPC and FSE placed. Of note, FSE initially not functioning despite confirming appropriate placement. Unplugged at device and monitor and functioning better.   At that time tracing Cat I (atleast from what was able to be traced) and therefore plan to continue pitocin at 20 and closely monitor  Renard Matter, MD, MPH OB Fellow, Faculty Practice

## 2021-06-22 ENCOUNTER — Encounter: Payer: Managed Care, Other (non HMO) | Admitting: Obstetrics and Gynecology

## 2021-06-22 ENCOUNTER — Encounter (HOSPITAL_COMMUNITY): Payer: Self-pay | Admitting: Family Medicine

## 2021-06-22 DIAGNOSIS — O24424 Gestational diabetes mellitus in childbirth, insulin controlled: Secondary | ICD-10-CM

## 2021-06-22 DIAGNOSIS — O99354 Diseases of the nervous system complicating childbirth: Secondary | ICD-10-CM

## 2021-06-22 DIAGNOSIS — Z3A37 37 weeks gestation of pregnancy: Secondary | ICD-10-CM

## 2021-06-22 LAB — GLUCOSE, CAPILLARY
Glucose-Capillary: 113 mg/dL — ABNORMAL HIGH (ref 70–99)
Glucose-Capillary: 115 mg/dL — ABNORMAL HIGH (ref 70–99)
Glucose-Capillary: 91 mg/dL (ref 70–99)
Glucose-Capillary: 124 mg/dL — ABNORMAL HIGH (ref 70–99)
Glucose-Capillary: 78 mg/dL (ref 70–99)
Glucose-Capillary: 82 mg/dL (ref 70–99)

## 2021-06-22 MED ORDER — WITCH HAZEL-GLYCERIN EX PADS: 1.0000 "application " | MEDICATED_PAD | CUTANEOUS | Status: DC | PRN

## 2021-06-22 MED ORDER — LORATADINE 10 MG PO TABS
10.0000 mg | ORAL_TABLET | Freq: Every day | ORAL | Status: DC
  Administered 2021-06-23 – 2021-06-24 (×2): 10 mg via ORAL
  Filled 2021-06-22 (×2): qty 1

## 2021-06-22 MED ORDER — BENZOCAINE-MENTHOL 20-0.5 % EX AERO
1.0000 "application " | INHALATION_SPRAY | CUTANEOUS | Status: DC | PRN
  Filled 2021-06-22: qty 56

## 2021-06-22 MED ORDER — SENNOSIDES-DOCUSATE SODIUM 8.6-50 MG PO TABS
2.0000 | ORAL_TABLET | Freq: Every day | ORAL | Status: DC
  Administered 2021-06-23 – 2021-06-24 (×2): 2 via ORAL
  Filled 2021-06-22 (×2): qty 2

## 2021-06-22 MED ORDER — IBUPROFEN 600 MG PO TABS
600.0000 mg | ORAL_TABLET | Freq: Four times a day (QID) | ORAL | Status: DC
  Administered 2021-06-22 – 2021-06-24 (×9): 600 mg via ORAL
  Filled 2021-06-22 (×9): qty 1

## 2021-06-22 MED ORDER — ONDANSETRON HCL 4 MG PO TABS: 4.0000 mg | ORAL_TABLET | ORAL | Status: DC | PRN

## 2021-06-22 MED ORDER — DIPHENHYDRAMINE HCL 25 MG PO CAPS: 25.0000 mg | ORAL_CAPSULE | Freq: Four times a day (QID) | ORAL | Status: DC | PRN

## 2021-06-22 MED ORDER — MEASLES, MUMPS & RUBELLA VAC IJ SOLR: 0.5000 mL | Freq: Once | INTRAMUSCULAR | Status: DC

## 2021-06-22 MED ORDER — ALBUTEROL SULFATE HFA 108 (90 BASE) MCG/ACT IN AERS: 1.0000 | INHALATION_SPRAY | Freq: Four times a day (QID) | RESPIRATORY_TRACT | Status: DC | PRN

## 2021-06-22 MED ORDER — ALBUTEROL SULFATE (2.5 MG/3ML) 0.083% IN NEBU
3.0000 mL | INHALATION_SOLUTION | Freq: Four times a day (QID) | RESPIRATORY_TRACT | Status: DC | PRN
Start: 2021-06-22 — End: 2021-06-24

## 2021-06-22 MED ORDER — LEVETIRACETAM 750 MG PO TABS
1500.0000 mg | ORAL_TABLET | Freq: Two times a day (BID) | ORAL | Status: DC
Start: 2021-06-22 — End: 2021-06-24
  Administered 2021-06-22 – 2021-06-24 (×5): 1500 mg via ORAL
  Filled 2021-06-22 (×6): qty 2

## 2021-06-22 MED ORDER — MOMETASONE FURO-FORMOTEROL FUM 100-5 MCG/ACT IN AERO
2.0000 | INHALATION_SPRAY | Freq: Two times a day (BID) | RESPIRATORY_TRACT | Status: DC
  Administered 2021-06-23 – 2021-06-24 (×3): 2 via RESPIRATORY_TRACT
  Filled 2021-06-22: qty 8.8

## 2021-06-22 MED ORDER — MEDROXYPROGESTERONE ACETATE 150 MG/ML IM SUSP: 150.0000 mg | INTRAMUSCULAR | Status: DC | PRN

## 2021-06-22 MED ORDER — ACETAMINOPHEN 325 MG PO TABS
650.0000 mg | ORAL_TABLET | ORAL | Status: DC | PRN
  Administered 2021-06-22 (×2): 650 mg via ORAL
  Filled 2021-06-22 (×2): qty 2

## 2021-06-22 MED ORDER — COCONUT OIL OIL: 1.0000 "application " | TOPICAL_OIL | Status: DC | PRN

## 2021-06-22 MED ORDER — DIBUCAINE (PERIANAL) 1 % EX OINT: 1.0000 "application " | TOPICAL_OINTMENT | CUTANEOUS | Status: DC | PRN

## 2021-06-22 MED ORDER — LAMOTRIGINE 25 MG PO TABS: 25.0000 mg | ORAL_TABLET | Freq: Two times a day (BID) | ORAL | Status: DC

## 2021-06-22 MED ORDER — SIMETHICONE 80 MG PO CHEW: 80.0000 mg | CHEWABLE_TABLET | ORAL | Status: DC | PRN

## 2021-06-22 MED ORDER — FLUTICASONE PROPIONATE 50 MCG/ACT NA SUSP
2.0000 | Freq: Every day | NASAL | Status: DC
Start: 2021-06-22 — End: 2021-06-24
  Administered 2021-06-23 – 2021-06-24 (×2): 2 via NASAL
  Filled 2021-06-22: qty 16

## 2021-06-22 MED ORDER — LAMOTRIGINE 100 MG PO TABS
200.0000 mg | ORAL_TABLET | Freq: Two times a day (BID) | ORAL | Status: DC
Start: 2021-06-22 — End: 2021-06-22

## 2021-06-22 MED ORDER — TETANUS-DIPHTH-ACELL PERTUSSIS 5-2.5-18.5 LF-MCG/0.5 IM SUSY: 0.5000 mL | PREFILLED_SYRINGE | Freq: Once | INTRAMUSCULAR | Status: DC

## 2021-06-22 MED ORDER — ONDANSETRON HCL 4 MG/2ML IJ SOLN: 4.0000 mg | INTRAMUSCULAR | Status: DC | PRN

## 2021-06-22 MED ORDER — PRENATAL MULTIVITAMIN CH
1.0000 | ORAL_TABLET | Freq: Every day | ORAL | Status: DC
  Administered 2021-06-22 – 2021-06-24 (×3): 1 via ORAL
  Filled 2021-06-22 (×3): qty 1

## 2021-06-22 MED ORDER — LAMOTRIGINE 25 MG PO TABS
225.0000 mg | ORAL_TABLET | Freq: Two times a day (BID) | ORAL | Status: DC
  Administered 2021-06-22 – 2021-06-24 (×5): 225 mg via ORAL
  Filled 2021-06-22 (×7): qty 1

## 2021-06-22 NOTE — Consult Note (Signed)
Consult for post-epidural numbness/weakness:  Called by RN to assess patient with complaint of RLE numbness and weakness. Patient had epidural placed yesterday evening for labor. Per procedure note, placement was relatively difficult due to body habitus and patient's inability to hold still during placement. Per patient, her RLE and right abdomen were very numb after placement and she required multiple boluses while in left lateral decubitus to achieve comfort on the left side. Patient delivered this morning and epidural catheter was removed shortly afterwards.   Patient states that she has regained sensation and some motor function in her right foot, but her leg from the ankle to hip remains numb and significantly weak. She states she had not had any recovery of function, aside from the foot, since delivery. Her LLE is back to baseline, both in sensation and strength.   On exam, patient is insensate to sharp sensation from the right hip to the ankle, but remains with normal sensation to both dorsal and plantar surfaces of the foot. No consistent dermatomal distribution identified. She has 2/5 strength with dorsi and plantarflexion of the right foot, compared to 5/5 on the left. She is unable to lift her right leg off the bed, nor bend at the knee against gravity.   Patient denies any history of bleeding problems or anticoagulant use, last CBC displays normal platelet level. Patient does relay history of back issues and was diagnosed with a "pinched nerve" she believed to be on the right side, for which she was recommended back surgery, but did not follow through with. As stated before, she denied preexisting numbness or weakness of the RLE prior to epidural.   At this time, I feel further monitoring for return of nerve function is adequate. Patient had experienced a one-sided epidural requiring multiple additional doses of medication to obtain relief on the left side. She had a dense right-sided block  which likely is the cause of delayed return of motor/sensory function. Patient instructed to alert RN to call anesthesia team for further investigation if function does not show signs of improvement over the next few hours. Will consider ordering imaging studies if necessary. Doubt presence of hematoma given presentation and exam, but remains on the differential.

## 2021-06-22 NOTE — Progress Notes (Addendum)
Labor Progress Note Latoya Stark is a 26 y.o. [phone] at 80w4dpresented for IOL for BPP 4/8 and also with A2GDM S:  Feeling more pressure  O:  BP 125/72   Pulse (!) 121   Temp 98.1 F (36.7 C) (Oral)   Resp 18   Ht '5\' 3"'$  (1.6 m)   Wt (!) 151.5 kg   LMP 10/02/2020 (Approximate)   SpO2 97%   BMI 59.17 kg/m  EFM: 140/moderate variability/+accels/no decels  CVE: Dilation: 7 Effacement (%): 80, 90 Cervical Position: Posterior Station: -1, 0 Presentation: Vertex Exam by:: AKeith Rake RN   A&P: 26y.o.  [id number] 381w4dresented for IOL for BPP 4/8 and also with A2GDM #Labor: Cervix now 7 cm from 6 cm. Pitocin restarted around 11PM. MVUs not adequate yet but making some cervical change. Will continue uptitrating as long as fetus tolerates. Will plan to recheck in 4 hours after MVUs adequate or sooner if clinically warranted #Pain: Epidural #FWB: Cat I #GBS negative  #A2GDM BG within goal in 80s-low 110s. Continue q2 hr checks  [name], MPH OB Fellow, Faculty Practice

## 2021-06-22 NOTE — Progress Notes (Signed)
Dr. Fransisco Beau called to check on patient's right leg. Patient told me her toes are starting to tingle now.

## 2021-06-22 NOTE — Discharge Summary (Cosign Needed)
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 yo [id number] 357w4das admitted to the hospital 06/20/2021 for induction of labor.  Indication for induction: A2 DM and BPP 4/8 .  Patient had an uncomplicated labor course as follows: Membrane Rupture Time/Date: 6:25 PM ,06/21/2021   Delivery Method:Vaginal, Spontaneous  Episiotomy: None  Lacerations:  None  Details of delivery can be found in separate delivery note.  Patient had a routine postpartum course. Patient is discharged home 06/24/21.  Newborn Data: Birth date:06/22/2021  Birth time:5:23 AM  Gender:Female  Living status:Living  Apgars:9 ,9  Weight:3190 g   Magnesium Sulfate received: No BMZ received: No Rhophylac:N/A MMR:N/A T-DaP:Given prenatally Flu: Yes Transfusion:No  Physical exam  Vitals:   06/23/21 0522 06/23/21 1403 06/23/21 2114 06/24/21 0535  BP: 111/70 (!) 111/50 124/77 126/84  Pulse: 100 97 92 93  Resp: _0 Temp: 98.1 F (36.7 C) 98.4 F (36.9 C) 98.3 F (36.8 C) 98.7 F (37.1 C)  TempSrc: Oral Oral Oral Oral  SpO2:  98% 98% 99%  Weight:       Height:       General: alert, cooperative, and no distress Lochia: appropriate Uterine Fundus: firm Incision: N/A DVT Evaluation: No evidence of DVT seen on physical exam. Negative Homan's sign. No cords or calf tenderness. No significant calf/ankle edema. Labs: Lab Results  Component Value Date   WBC 6.1 06/20/2021   HGB 11.2 (L) 06/20/2021   HCT 34.0 (L) 06/20/2021   MCV 87.6 06/20/2021   PLT 293 06/20/2021      Latest Ref Rng & Units 05/26/2021    4:37 PM  CMP  Glucose 70 - 99 mg/dL 102   BUN 6 - 20 mg/dL 5   Creatinine 0.44 - 1.00 mg/dL 0.53   Sodium 135 - 145 mmol/L 133   Potassium 3.5 - 5.1 mmol/L 3.6   Chloride 98 - 111 mmol/L 104   CO2 22 - 32 mmol/L 22   Calcium 8.9 - 10.3 mg/dL 8.8   Total Protein 6.5 - 8.1 g/dL 6.5   Total Bilirubin 0.3 - 1.2 mg/dL 0.3   Alkaline Phos 38 - 126 U/L 117   AST 15 - 41 U/L 22   ALT 0 - 44 U/L 26    Edinburgh Score:    06/10/2019    2:24 PM  Edinburgh Postnatal Depression Scale Screening Tool  I have been able to laugh and see the funny  side of things. 0  I have looked forward with enjoyment to things. 1  I have blamed myself unnecessarily when things went wrong. 1  I have been anxious or worried for no good reason. 3  I have felt scared or panicky for no good reason. 3  Things have been getting on top of me. 1  I have been so unhappy that I have had difficulty sleeping. 0  I have felt sad or miserable. 0  I have been so unhappy that I have been crying. 1  The thought of harming myself has occurred to me. 0  Edinburgh Postnatal Depression Scale Total 10     After visit meds:  Allergies as of 06/24/2021       Reactions   Hydrocodone Hives   Milk-related Compounds Dermatitis   Breaks face out *can have 2% milk*   Mushroom Extract Complex Anaphylaxis   Penicillins Anaphylaxis   Has patient had a PCN reaction causing immediate rash, facial/tongue/throat swelling, SOB or lightheadedness with hypotension: Yes Has  patient had a PCN reaction causing severe rash involving mucus membranes or skin necrosis: No Has patient had a PCN reaction that required hospitalization No Has patient had a PCN reaction occurring within the last 10 years: Yes If all of the above answers are "NO", then may proceed with Cephalosporin use.   Cheese Other (See Comments)   Breaks face out   Latex Itching, Swelling   Sulfa Antibiotics Swelling        Medication List     STOP taking these medications    Accu-Chek Guide test strip Generic drug: glucose blood   Accu-Chek Guide w/Device Kit   Accu-Chek Softclix Lancets lancets   albuterol 108 (90 Base) MCG/ACT inhaler Commonly known as: VENTOLIN HFA   Blood Pressure Cuff Misc   Blood Pressure Kit Devi   budesonide-formoterol 80-4.5 MCG/ACT inhaler Commonly known as: Symbicort   butalbital-acetaminophen-caffeine 50-325-40 MG tablet Commonly known as: FIORICET   cetirizine 10 MG tablet Commonly known as: ZyrTEC Allergy   ferrous sulfate 325 (65 FE) MG tablet   fluticasone 50 MCG/ACT nasal spray Commonly known as: FLONASE   lamoTRIgine 200 MG tablet Commonly known as: LAMICTAL   lamoTRIgine 25 MG tablet Commonly known as: LAMICTAL   levETIRAcetam 750 MG tablet Commonly known as: KEPPRA   metFORMIN 500 MG tablet Commonly known as: GLUCOPHAGE   metoCLOPramide 10 MG tablet Commonly known as: REGLAN   ondansetron 4 MG disintegrating tablet Commonly known as: Zofran ODT   pantoprazole 40 MG tablet Commonly known as: Protonix   PrePLUS 27-1 MG Tabs   promethazine 25 MG tablet Commonly known as: PHENERGAN       TAKE these medications    acetaminophen 325 MG tablet Commonly known as: Tylenol Take 2 tablets (650 mg total) by mouth every 4 (four) hours as needed (for pain scale < 4). What changed:  medication strength how much to take when to take this reasons to take this   ibuprofen 600 MG tablet Commonly known as: ADVIL Take 1  tablet (600 mg total) by mouth every 6 (six) hours.         Discharge home in stable condition Infant Feeding: Bottle and Breast Infant Disposition:home with mother Discharge instruction: per After Visit Summary and Postpartum booklet. Activity: Advance as tolerated. Pelvic rest for 6 weeks.  Diet: routine diet Future Appointments: Future Appointments  Date Time Provider [place]  07/01/2021 10:15 AM [place] Bobbye Morton, PA-C [facility]  08/02/2021  9:00 AM [facility]  08/02/2021 11:15 AM Anyanwu, Sallyanne Havers, MD [facility]  09/21/2021 11:00 AM Ubaldo Glassing, Amy, NP [facility] None   Follow up Visit: Message sent to [facility] by Dr. Cy Blamer on 6/14  Please schedule this patient for a In person postpartum visit in 4 weeks with the following provider: Any provider. Additional Postpartum F/U: None   High risk pregnancy complicated by: GDM Delivery mode:  Vaginal, Spontaneous  Anticipated Birth Control:   plans outpatient Nexplanon   06/24/2021 Christin Fudge, CNM

## 2021-06-22 NOTE — Progress Notes (Addendum)
Pt still complaining of right leg numbness. Per pt, unchanged since arriving to [facility] at approximately 0800. Top of foot feels normal, but rest of leg up to hip numb to touch and unable to move. Left leg feels normal and can move without problem. Assisted pt to bathroom around 1000 with stedy, pt able to bear weight on stedy. RN notified anesthesia T. Brock after 6 hours with no change. Per anesthesia, will come evaluate pt.

## 2021-06-23 LAB — GLUCOSE, CAPILLARY: Glucose-Capillary: 84 mg/dL (ref 70–99)

## 2021-06-23 NOTE — Progress Notes (Signed)
Post Partum Day #1 Subjective: no complaints, up ad lib, and tolerating PO; breast and bottlefeeding; plans on Nexplanon placement at her PP visit; she desires a circumcision for her son- she was consented and a note placed in his chart; declines early d/c  Objective: Blood pressure 116/70, pulse 100, temperature 98.1 F (36.7 C), temperature source Oral, resp. rate 17, height '5\' 3"'$  (1.6 m), weight (!) 151.5 kg, last menstrual period 10/02/2020, SpO2 97 %, unknown if currently breastfeeding.  Physical Exam:  General: alert, cooperative, and no distress Lochia: appropriate Uterine Fundus: firm DVT Evaluation: No evidence of DVT seen on physical exam.  Recent Labs    06/20/21 1602  HGB 11.2*  HCT 34.0*    Assessment/Plan: Plan for discharge tomorrow and Circumcision prior to discharge Will get fasting CBG tomorrow as she hasn't had one yet   LOS: 3 days   Myrtis Ser CNM 06/23/2021, 7:19 AM

## 2021-06-23 NOTE — Lactation Note (Signed)
This note was copied from a baby's chart. 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 Per mom breast fed after delivery and since bottles. Mom expressed desire to breast feed and be seen by LC . Mom has 8 month exp with her 1st baby.  Last feeding was at 5:33 am . LC offered to assist and mom receptive.  Mom needed assistance due to and IV. Latch score 7 and baby still feeding at 8 mins with a few swallows. Warm moist compress above baby on the breast to enhance the flow since the baby has had several bottles.  Mom will need a hand pump on the day of D/C.   Maternal Data Has patient been taught Hand Expression?: Yes Does the patient have breastfeeding experience prior to this delivery?: Yes How long did the patient breastfeed?: per mom 8 months  Feeding Mother's Current Feeding Choice: Breast Milk and Formula Nipple Type: Slow - flow  LATCH Score Latch: Grasps breast easily, tongue down, lips flanged, rhythmical sucking.  Audible Swallowing: A few with stimulation  Type of Nipple: Everted at rest and after stimulation  Comfort (Breast/Nipple): Soft / non-tender  Hold (Positioning): Full assist, staff holds infant at breast  LATCH Score: 7   Lactation Tools Discussed/Used Tools:  (will need a hand pump for D/C)  Interventions Interventions: Breast feeding basics reviewed;Assisted with latch;Skin to skin;Breast massage;Hand express;Breast compression;Adjust position;Support pillows;Position options;LC Services brochure  Discharge [place] Program: Yes  Consult Status Consult Status: Follow-up Date: 06/23/21 Follow-up type: In-patient    [place] 06/23/2021, 9:12 AM

## 2021-06-23 NOTE — Anesthesia Postprocedure Evaluation (Signed)
Anesthesia Post Note  Patient: Latoya Stark  Procedure(s) Performed: AN AD HOC LABOR EPIDURAL     Patient location during evaluation: Mother Baby Anesthesia Type: Epidural Level of consciousness: awake Pain management: satisfactory to patient Vital Signs Assessment: post-procedure vital signs reviewed and stable Respiratory status: spontaneous breathing Cardiovascular status: stable Anesthetic complications: no   No notable events documented.  Last Vitals:  Vitals:   06/22/21 2129 06/23/21 0522  BP: 116/70   Pulse: (!) 103 100  Resp: 16 17  Temp: 37.2 C 36.7 C  SpO2:      Last Pain:  Vitals:   06/23/21 0522  TempSrc: Oral  PainSc:    Pain Goal:                   [employer]

## 2021-06-23 NOTE — Social Work (Signed)
CSW received consult for hx of Anxiety and Depression. 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  CSW met with MOB to offer support and complete assessment.    CSW met with MOB at bedside and introduced CSW role. CSW observed MOB in bed and maternal grandmother at bedside holding the infant. CSW offered MOB privacy. MOB reported, "Oh, that's mom she can stay." CSW inquired how MOB has felt since giving birth. MOB reported feeling okay and tired. MOB shared the L&D was stressful because she was in labor for forty-eight hours without eating. CSW acknowledged concern and praised her efforts. CSW inquired how MOB felt during the pregnancy. MOB shared, "My emotions were up and down." MOB expressed she was very hormonal, "I felt happy, depressed, overwhelmed, irritable and tired." CSW inquired about MOB mental health history. MOB reported she was not sure of her initial diagnosis and asked her mom for clarification. Maternal grandmother reported that MOB was first diagnosed with ADHD and ADD in 2000. In 2005 she was diagnosed with anxiety and the depression diagnosis was recent. MOB reported no medication treatment however she has seen a therapist in the past. 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 MOB reported that her grandmother had just passed, the infant was born with a heart defect, heart murmur and had minor seizures which she was afraid may possibly happen. MOB share that she and her mom both have seizures. MOB shared that she isolated herself from others, felt overwhelmed and angry. MOB reported that she started seeing IBH counselor "Seth Bake" in the [facility] clinic which she felt was very helpful. MOB reported this pregnancy she also had few visits with the [facility] counselor and feels very comfortable reaching out to her again if she has concerns. MOB reflected on how her anxiety and anger has improved since she has learned how to cope and reach out for help when feeling overwhelmed. MOB gave  example that she likes to walk even if it means leaving the car to walk alongside the road with moving vehicles. CSW acknowledged MOB ability to cope and discussed healthier coping mechanism to use to ensure her safety. MOB reported understanding. MOB identified FOB, her paternal grandmother, sister, mom, brother, and aunt as supports. CSW assessed MOB for safety. MOB denied thoughts of ham to self and others.   CSW provided education regarding the baby blues period vs. perinatal mood disorders, discussed treatment and gave resources for mental health follow up if concerns arise. CSW recommended MOB complete self-evaluation during the postpartum time period using the New Mom Checklist from Postpartum Progress and encouraged MOB to contact a medical professional if symptoms are noted at any time.    MOB reported she essential items for the infant including a 4-1 pack n play where the infant will sleep. CSW provided review of Sudden Infant Death Syndrome (SIDS) precautions. MOB has chosen [facility] pediatrics for the infant's follow up care. CSW assessed MOB for additional need. MOB reported no further need.   CSW identifies no further need for intervention and no barriers to discharge at this time.   Kathrin Greathouse, MSW, LCSW Women's and [place] Worker  [phone] 06/23/2021  1:10PM

## 2021-06-24 ENCOUNTER — Ambulatory Visit: Payer: Self-pay

## 2021-06-24 ENCOUNTER — Institutional Professional Consult (permissible substitution): Payer: Managed Care, Other (non HMO) | Admitting: Physician Assistant

## 2021-06-24 LAB — GLUCOSE, CAPILLARY: Glucose-Capillary: 78 mg/dL (ref 70–99)

## 2021-06-24 MED ORDER — IBUPROFEN 600 MG PO TABS: 600.0000 mg | ORAL_TABLET | Freq: Four times a day (QID) | ORAL | 0 refills | Status: DC

## 2021-06-24 MED ORDER — ACETAMINOPHEN 325 MG PO TABS
650.0000 mg | ORAL_TABLET | ORAL | Status: DC | PRN
Start: 2021-06-24 — End: 2022-10-30

## 2021-06-24 NOTE — Social Work (Signed)
CSW received consult due to score 14  on Edinburgh Depression Screen.    CSW met with MOB on 6/15. MOB presented appropriate and denied thoughts of harm to self others. CSW provided education regarding Baby Blues vs PMADs and provided MOB with resources for mental health follow up.  CSW encouraged MOB to evaluate her mental health throughout the postpartum period with the use of the New Mom Checklist developed by Postpartum Progress as well as the Edinburgh Postnatal Depression Scale and notify a medical professional if symptoms arise.     Latoya Stark, MSW, LCSW [facility]  Clinical Social Worker  [phone] 06/24/2021  11:33 AM  

## 2021-06-24 NOTE — Lactation Note (Signed)
This note was copied from a baby's chart. Lactation Consultation Note  Patient Name: Latoya Stark FPOIP'P Date: 06/24/2021 Reason for consult: Follow-up assessment;Maternal endocrine disorder;Infant weight loss;Other (Comment);Early term 37-38.6wks (5 % weight loss. per mom will be going to [facility] to obtain a DEBP. LC provided the DEBP kit. LC reviewed BF D/C teaching.) Age:27 hours LC reviewed supply and demand/ importance of working on getting her milk supply established and protected.  Per mom  work on the breast feeding.  LC mentioned if the baby won't latch since the baby as been exposed to feeding from bottle. May need to give the baby and appetizer prior to latching at the breast.  If the baby is receiving a bottle for a feeding , need to pump.  Mom aware of the [facility] resources after D/C.   Maternal Data Has patient been taught Hand Expression?: Yes  Feeding Mother's Current Feeding Choice: Breast Milk and Formula Nipple Type: Slow - flow  LATCH Score                    Lactation Tools Discussed/Used    Interventions Interventions: Breast feeding basics reviewed;LC Services brochure;DEBP  Discharge Discharge Education: Engorgement and breast care;Warning signs for feeding baby Pump: DEBP WIC Program: Yes  Consult Status Consult Status: Complete Date: 06/24/21    Myer Haff 06/24/2021, 12:15 PM

## 2021-06-27 ENCOUNTER — Other Ambulatory Visit: Payer: Medicaid Other

## 2021-06-30 ENCOUNTER — Telehealth (HOSPITAL_COMMUNITY): Payer: Self-pay | Admitting: *Deleted

## 2021-06-30 ENCOUNTER — Encounter: Payer: Managed Care, Other (non HMO) | Admitting: Obstetrics and Gynecology

## 2021-06-30 DIAGNOSIS — Z1331 Encounter for screening for depression: Secondary | ICD-10-CM

## 2021-06-30 NOTE — Telephone Encounter (Signed)
Voice mailbox is full. Unable to leave message.  Odis Hollingshead, RN 06-30-2021 at 2:14pm

## 2021-07-01 ENCOUNTER — Institutional Professional Consult (permissible substitution): Payer: Medicaid Other | Admitting: Physician Assistant

## 2021-07-01 ENCOUNTER — Encounter: Payer: Self-pay | Admitting: Radiology

## 2021-07-02 ENCOUNTER — Inpatient Hospital Stay (HOSPITAL_COMMUNITY)
Admission: AD | Admit: 2021-07-02 | Payer: Medicaid Other | Source: Home / Self Care | Admitting: Obstetrics and Gynecology

## 2021-07-02 ENCOUNTER — Inpatient Hospital Stay (HOSPITAL_COMMUNITY): Payer: Medicaid Other

## 2021-07-04 ENCOUNTER — Other Ambulatory Visit: Payer: Medicaid Other

## 2021-07-05 ENCOUNTER — Encounter: Payer: Managed Care, Other (non HMO) | Admitting: Obstetrics & Gynecology

## 2021-07-05 NOTE — BH Specialist Note (Deleted)
Integrated Behavioral Health via Telemedicine Visit  07/05/2021 KIFFANY SCHELLING [id number]  Number of [place] Clinician visits: No data recorded Session Start time: No data recorded  Session End time: No data recorded Total time in minutes: No data recorded  Referring Provider: *** Patient/Family location: *** [facility] Provider location: *** All persons participating in visit: *** Types of Service: {CHL AMB TYPE OF SERVICE:[phone]}  I connected with [place] and/or Tierrah L Furtick's {family members:20773} via  Telephone or Video Enabled Telemedicine Application  (Video is Caregility application) and verified that I am speaking with the correct person using two identifiers. Discussed confidentiality: {YES/NO:21197}  I discussed the limitations of telemedicine and the availability of in person appointments.  Discussed there is a possibility of technology failure and discussed alternative modes of communication if that failure occurs.  I discussed that engaging in this telemedicine visit, they consent to the provision of behavioral healthcare and the services will be billed under their insurance.  Patient and/or legal guardian expressed understanding and consented to Telemedicine visit: {YES/NO:21197}  Presenting Concerns: Patient and/or family reports the following symptoms/concerns: *** Duration of problem: ***; Severity of problem: {Mild/Moderate/Severe:20260}  Patient and/or Family's Strengths/Protective Factors: {CHL AMB BH PROTECTIVE FACTORS:[phone]}  Goals Addressed: Patient will:  Reduce symptoms of: {IBH Symptoms:21014056}   Increase knowledge and/or ability of: {IBH Patient Tools:21014057}   Demonstrate ability to: {IBH Goals:21014053}  Progress towards Goals: {CHL AMB BH PROGRESS TOWARDS GOALS:2398180924}  Interventions: Interventions utilized:  {IBH Interventions:21014054} Standardized Assessments completed: {IBH Screening  Tools:21014051}  Patient and/or Family Response: ***  Assessment: Patient currently experiencing ***.   Patient may benefit from ***.  Plan: Follow up with behavioral health clinician on : *** Behavioral recommendations: *** Referral(s): {IBH Referrals:21014055}  I discussed the assessment and treatment plan with the patient and/or parent/guardian. They were provided an opportunity to ask questions and all were answered. They agreed with the plan and demonstrated an understanding of the instructions.   They were advised to call back or seek an in-person evaluation if the symptoms worsen or if the condition fails to improve as anticipated.  Caroleen Hamman Gerica Koble, LCSW

## 2021-07-15 ENCOUNTER — Institutional Professional Consult (permissible substitution): Payer: Medicaid Other | Admitting: Physician Assistant

## 2021-07-20 NOTE — BH Specialist Note (Signed)
Pt did not arrive to video visit and did not answer the phone; Left HIPPA-compliant message to call back Shown Dissinger from [facility] at  [phone] (Trinaty Bundrick's office).  ?; left MyChart message for patient.  ? ?

## 2021-07-22 ENCOUNTER — Ambulatory Visit: Payer: Medicaid Other | Admitting: Clinical

## 2021-07-22 DIAGNOSIS — Z91199 Patient's noncompliance with other medical treatment and regimen due to unspecified reason: Secondary | ICD-10-CM

## 2021-07-26 NOTE — BH Specialist Note (Signed)
Integrated Behavioral Health via Telemedicine Visit  07/28/2021 Latoya Stark [id number]  Number of [name] Clinician visits: 1- Initial Visit  Session Start time: 8786   Session End time: 7672  Total time in minutes: 50   Referring Provider: Arlina Robes, MD Patient/Family location: Home [facility] Provider location: [facility] [place] [facility]  All persons participating in visit: Patient Latoya Stark and [place]   Types of Service: Individual psychotherapy and Video visit  I connected with [place] and/or [name]  via  Telephone or Video Enabled Telemedicine Application  (Video is Caregility application) and verified that I am speaking with the correct person using two identifiers. Discussed confidentiality: Yes   I discussed the limitations of telemedicine and the availability of in person appointments.  Discussed there is a possibility of technology failure and discussed alternative modes of communication if that failure occurs.  I discussed that engaging in this telemedicine visit, they consent to the provision of behavioral healthcare and the services will be billed under their insurance.  Patient and/or legal guardian expressed understanding and consented to Telemedicine visit: Yes   Presenting Concerns: Patient and/or family reports the following symptoms/concerns: Increase in anxiety with panic attacks postpartum; migraines and seizures; many recent family losses (cousins, aunts, uncle, grandmother, etc.), poor appetite (most days eats once/day), poor sleep (up to 4 hours nightly in total).  Duration of problem: Increase postpartum; Severity of problem: severe  Patient and/or Family's Strengths/Protective Factors: Social connections, Concrete supports in place (healthy food, safe environments, etc.), and Sense of purpose  Goals Addressed: Patient will:  Reduce symptoms of:  anxiety, depression, and stress   Increase knowledge and/or ability of: healthy habits   Demonstrate ability to: Increase healthy adjustment to current life circumstances and Begin healthy grieving over losses  Progress towards Goals: Ongoing  Interventions: Interventions utilized:  Functional Assessment of ADLs, Psychoeducation and/or Health Education, and Supportive Reflection Standardized Assessments completed: Not Needed  Patient and/or Family Response: Patient agrees with treatment plan.   Assessment: Patient currently experiencing Adjustment disorder with mixed anxious and depressed mood and Grief.   Patient may benefit from psychoeducation and brief therapeutic interventions regarding coping with symptoms of anxiety, depression, life stress and grief.  Plan: Follow up with behavioral health clinician on : Two weeks Behavioral recommendations:  -Continue taking medications as prescribed by medical providers -Allow supportive friends/family to provide emotional and practical support (ex. Let FOB care for children so you can get much-needed rest) -Set reminder on phone to eat something every 5 hours in daytime Or have sister call to remind to eat; notice any changes in mood or physical body(ex.migraine severity)  -FOCUS ON HEALTHY SELF-CARE AS PRIORITY for next two weeks (routine meals and adequate rest) -Referrals: [place] (In Clinic)  I discussed the assessment and treatment plan with the patient and/or parent/guardian. They were provided an opportunity to ask questions and all were answered. They agreed with the plan and demonstrated an understanding of the instructions.   They were advised to call back or seek an in-person evaluation if the symptoms worsen or if the condition fails to improve as anticipated.  [name], LCSW     06/15/2021   11:24 AM 04/05/2021    9:06 AM 03/22/2021   11:21 AM 12/30/2020   10:42 AM 11/18/2020    5:26 PM   Depression screen PHQ 2/9  Decreased Interest 3 0 1  1   Down, Depressed, Hopeless '2 1 1 '$ 0   PHQ - 2 Score '5 1 2 1   '$ Altered sleeping '1 3 3 3 2  '$ Tired, decreased energy '3 2 2 1 1  '$ Change in appetite 0 0 1 2 0  Feeling bad or failure about yourself  1 0 0 0 0  Trouble concentrating 0 1 2 0 0  Moving slowly or fidgety/restless 0 1 0 1 0  Suicidal thoughts 0 0 0 0 0  PHQ-9 Score '10 8 10 8   '$ Difficult doing work/chores Somewhat difficult Not difficult at all         06/15/2021   11:24 AM 04/05/2021    9:07 AM 12/30/2020   10:46 AM 04/21/2019   10:58 AM  GAD 7 : Generalized Anxiety Score  Nervous, Anxious, on Edge '3 2 1 '$ 0  Control/stop worrying 3 3 0 0  Worry too much - different things '3 2 3 '$ 0  Trouble relaxing '3 1 1 '$ 0  Restless 3 0 1 0  Easily annoyed or irritable '3 1 2 '$ 0  Afraid - awful might happen 2 0 1 0  Total GAD 7 Score '20 9 9 '$ 0  Anxiety Difficulty Very difficult Not difficult at all  Not difficult at all

## 2021-07-28 ENCOUNTER — Ambulatory Visit (INDEPENDENT_AMBULATORY_CARE_PROVIDER_SITE_OTHER): Payer: Medicaid Other | Admitting: Clinical

## 2021-07-28 DIAGNOSIS — F4321 Adjustment disorder with depressed mood: Secondary | ICD-10-CM

## 2021-07-28 DIAGNOSIS — F4323 Adjustment disorder with mixed anxiety and depressed mood: Secondary | ICD-10-CM

## 2021-07-28 NOTE — Patient Instructions (Signed)
[facility] [address] [place], [place] [address] [phone] (main office) [phone] (Jadon Harbaugh's office)   

## 2021-08-01 NOTE — BH Specialist Note (Signed)
Integrated Behavioral Health via Telemedicine Visit  08/15/2021 Latoya Stark [id number]  Number of [place] Clinician visits: 2- Second Visit  Session Start time: 2440   Session End time: 1522  Total time in minutes: 62   Referring Provider: Arlina Robes, MD Patient/Family location: Home [facility] Provider location: [facility] [place] [facility]  All persons participating in visit: Patient Latoya Stark and Latoya Stark   Types of Service: Individual psychotherapy and Video visit  I connected with Latoya Stark and/or Latoya Stark  via  Telephone or Video Enabled Telemedicine Application  (Video is Caregility application) and verified that I am speaking with the correct person using two identifiers. Discussed confidentiality: Yes   I discussed the limitations of telemedicine and the availability of in person appointments.  Discussed there is a possibility of technology failure and discussed alternative modes of communication if that failure occurs.  I discussed that engaging in this telemedicine visit, they consent to the provision of behavioral healthcare and the services will be billed under their insurance.  Patient and/or legal guardian expressed understanding and consented to Telemedicine visit: Yes   Presenting Concerns: Patient and/or family reports the following symptoms/concerns: Feeling overwhelmed, crying, insomnia (baby's sleep improved, pt unable to sleep more than 1-2 hours at a time), migraines, fatigue; worried about not producing enough milk. Pt states has had sleep issues since childhood; thinking about referral to sleep study. Duration of problem: Postpartum; Severity of problem:  moderately severe  Patient and/or Family's Strengths/Protective Factors: Social connections, Concrete supports in place (healthy food, safe environments, etc.), and Sense of purpose  Goals Addressed: Patient  will:  Reduce symptoms of: anxiety, depression, insomnia, and stress   Increase knowledge and/or ability of: healthy habits   Demonstrate ability to: Increase healthy adjustment to current life circumstances and Increase adequate support systems for patient/family  Progress towards Goals: Ongoing  Interventions: Interventions utilized:  Mindfulness or Psychologist, educational, Link to [employer], and Supportive Reflection Standardized Assessments completed: Not Needed  Patient and/or Family Response: Patient agrees with treatment plan.   Assessment: Patient currently experiencing Adjustment disorder with mixed anxious and depressed mood and Grief.   Patient may benefit from continued therapeutic interventions .  Plan: Follow up with behavioral health clinician on : Two weeks Behavioral recommendations:  -Request referral to sleep study with PCP, after establishing care with PCP CALM relaxation breathing exercise twice daily (morning; at bedtime); as needed throughout the day. -Continue prioritizing healthy self-care (regular meals, adequate rest; allowing practical help from supportive friends and family) until at least postpartum medical appointment -Consider new mom support group as needed at either [email or web address].net or [email or web address]   Referral(s): [place] (In Clinic) and [employer]:  new mom support  I discussed the assessment and treatment plan with the patient and/or parent/guardian. They were provided an opportunity to ask questions and all were answered. They agreed with the plan and demonstrated an understanding of the instructions.   They were advised to call back or seek an in-person evaluation if the symptoms worsen or if the condition fails to improve as anticipated.  [name], LCSW     06/15/2021   11:24 AM 04/05/2021    9:06 AM 03/22/2021   11:21 AM 12/30/2020   10:42 AM 11/18/2020    5:26 PM   Depression screen PHQ 2/9  Decreased Interest 3 0 1 1   Down, Depressed, Hopeless 2 1 1  0   PHQ - 2 Score '5 1 2 1   '$ Altered sleeping '1 3 3 3 2  '$ Tired, decreased energy '3 2 2 1 1  '$ Change in appetite 0 0 1 2 0  Feeling bad or failure about yourself  1 0 0 0 0  Trouble concentrating 0 1 2 0 0  Moving slowly or fidgety/restless 0 1 0 1 0  Suicidal thoughts 0 0 0 0 0  PHQ-9 Score '10 8 10 8   '$ Difficult doing work/chores Somewhat difficult Not difficult at all         06/15/2021   11:24 AM 04/05/2021    9:07 AM 12/30/2020   10:46 AM 04/21/2019   10:58 AM  GAD 7 : Generalized Anxiety Score  Nervous, Anxious, on Edge '3 2 1 '$ 0  Control/stop worrying 3 3 0 0  Worry too much - different things '3 2 3 '$ 0  Trouble relaxing '3 1 1 '$ 0  Restless 3 0 1 0  Easily annoyed or irritable '3 1 2 '$ 0  Afraid - awful might happen 2 0 1 0  Total GAD 7 Score '20 9 9 '$ 0  Anxiety Difficulty Very difficult Not difficult at all  Not difficult at all

## 2021-08-02 ENCOUNTER — Encounter: Payer: Self-pay | Admitting: Emergency Medicine

## 2021-08-02 ENCOUNTER — Emergency Department: Payer: Medicaid Other

## 2021-08-02 ENCOUNTER — Encounter: Payer: Medicaid Other | Admitting: Obstetrics & Gynecology

## 2021-08-02 ENCOUNTER — Other Ambulatory Visit: Payer: Medicaid Other

## 2021-08-02 ENCOUNTER — Emergency Department
Admission: EM | Admit: 2021-08-02 | Discharge: 2021-08-02 | Disposition: A | Discharge status: Home / Self Care | Payer: Medicaid Other | Attending: Student in an Organized Health Care Education/Training Program | Admitting: Student in an Organized Health Care Education/Training Program

## 2021-08-02 ENCOUNTER — Other Ambulatory Visit: Payer: Self-pay

## 2021-08-02 DIAGNOSIS — W11XXXA Fall on and from ladder, initial encounter: Secondary | ICD-10-CM | POA: Insufficient documentation

## 2021-08-02 DIAGNOSIS — M25572 Pain in left ankle and joints of left foot: Secondary | ICD-10-CM | POA: Insufficient documentation

## 2021-08-02 MED ORDER — KETOROLAC TROMETHAMINE 30 MG/ML IJ SOLN
15.0000 mg | Freq: Once | INTRAMUSCULAR | Status: AC
  Administered 2021-08-02: 15 mg via INTRAMUSCULAR
  Filled 2021-08-02: qty 1

## 2021-08-02 NOTE — Progress Notes (Signed)
    Patient was here for scheduled postpartum visit but had to be rescheduled as she sustained an ankle injury at home and was in significant pain. She said she had to go to ER for evaluation.   Verita Schneiders, MD, [place] [facility] [employer], [place]

## 2021-08-02 NOTE — ED Notes (Signed)
Air cast and crutches provided with instructions .

## 2021-08-02 NOTE — ED Provider Notes (Signed)
[facility] Provider Note    Event Date/Time   First MD Initiated Contact with Patient 08/02/21 [phone]     (approximate)   History   Ankle Pain   HPI  Latoya Stark is a 26 y.o. female who presents to the ER for evaluation of left foot pain occurred after the patient had a ladder fall on her left foot yesterday.  Foot was flat on the ground.  Is having pain and discomfort with ambulation.  Does not take anything for pain.  Denies any numbness or tingling.  No other associated injury.     Physical Exam   Triage Vital Signs: ED Triage Vitals  Enc Vitals Group     BP 08/02/21 0933 120/86     Pulse Rate 08/02/21 0932 79     Resp 08/02/21 0932 18     Temp 08/02/21 0932 97.9 F (36.6 C)     Temp Source 08/02/21 0932 Oral     SpO2 08/02/21 0932 97 %     Weight 08/02/21 0932 (!) 334 lb (151.5 kg)     Height 08/02/21 0932 '5\' 3"'$  (1.6 m)     Head Circumference --      Peak Flow --      Pain Score 08/02/21 0932 10     Pain Loc --      Pain Edu? --      Excl. in [place]? --     Most recent vital signs: Vitals:   08/02/21 0932 08/02/21 0933  BP:  120/86  Pulse: 79   Resp: 18   Temp: 97.9 F (36.6 C)   SpO2: 97%      Constitutional: Alert  Eyes: Conjunctivae are normal.  Head: Atraumatic. Nose: No congestion/rhinnorhea. Mouth/Throat: Mucous membranes are moist.   Neck: Painless ROM.  Cardiovascular:   Good peripheral circulation. Respiratory: Normal respiratory effort.  No retractions.  Gastrointestinal: Soft and nontender.  Musculoskeletal:  no deformity Neurologic:  MAE spontaneously. No gross focal neurologic deficits are appreciated.  Skin:  Skin is warm, dry and intact. No rash noted. Psychiatric: Mood and affect are normal. Speech and behavior are normal.    ED Results / Procedures / Treatments   Labs (all labs ordered are listed, but only abnormal results are displayed) Labs Reviewed - No data to display     RADIOLOGY Please  see ED Course for my review and interpretation.  I personally reviewed all radiographic images ordered to evaluate for the above acute complaints and reviewed radiology reports and findings.  These findings were personally discussed with the patient.  Please see medical record for radiology report.    PROCEDURES:  Critical Care performed: No  Procedures   MEDICATIONS ORDERED IN ED: Medications  ketorolac (TORADOL) 30 MG/ML injection 15 mg (has no administration in time range)     IMPRESSION / MDM / ASSESSMENT AND PLAN / ED COURSE  I reviewed the triage vital signs and the nursing notes.                              Differential diagnosis includes, but is not limited to, contusion, fracture, dislocation  Patient presented to the ER for evaluation of left foot pain as described above.  Neurovascular intact.  Suspect contusion given mechanism but is tender right over the ATFL ligament therefore will given pain with ambulation will place in brace as well as give crutches for weightbearing as tolerated.  Will give outpatient follow-up.  Does appear stable and appropriate for discharge.      FINAL CLINICAL IMPRESSION(S) / ED DIAGNOSES   Final diagnoses:  Acute left ankle pain     Rx / DC Orders   ED Discharge Orders     None        Note:  This document was prepared using Dragon voice recognition software and may include unintentional dictation errors.    Merlyn Lot, MD 08/02/21 1018

## 2021-08-02 NOTE — ED Triage Notes (Signed)
Pt here with left ankle pain since yesterday. Pt states a ladder fell on her yesterday and she is having severe pain. Pt in wheelchair to triage.

## 2021-08-11 ENCOUNTER — Other Ambulatory Visit: Payer: Medicaid Other

## 2021-08-11 ENCOUNTER — Ambulatory Visit (INDEPENDENT_AMBULATORY_CARE_PROVIDER_SITE_OTHER): Payer: Medicaid Other | Admitting: Obstetrics and Gynecology

## 2021-08-11 ENCOUNTER — Other Ambulatory Visit (HOSPITAL_COMMUNITY)
Admission: RE | Admit: 2021-08-11 | Discharge: 2021-08-11 | Disposition: A | Discharge status: Home / Self Care | Payer: Medicaid Other | Source: Ambulatory Visit | Attending: Obstetrics & Gynecology | Admitting: Obstetrics & Gynecology

## 2021-08-11 VITALS — BP 126/85 | HR 78 | Wt 316.0 lb

## 2021-08-11 DIAGNOSIS — Z124 Encounter for screening for malignant neoplasm of cervix: Secondary | ICD-10-CM | POA: Insufficient documentation

## 2021-08-11 DIAGNOSIS — F53 Postpartum depression: Secondary | ICD-10-CM | POA: Insufficient documentation

## 2021-08-11 DIAGNOSIS — N764 Abscess of vulva: Secondary | ICD-10-CM

## 2021-08-11 DIAGNOSIS — O24415 Gestational diabetes mellitus in pregnancy, controlled by oral hypoglycemic drugs: Secondary | ICD-10-CM

## 2021-08-11 HISTORY — DX: Abscess of vulva: N76.4

## 2021-08-11 HISTORY — DX: Postpartum depression: F53.0

## 2021-08-11 MED ORDER — MEDROXYPROGESTERONE ACETATE 150 MG/ML IM SUSP: 150.0000 mg | INTRAMUSCULAR | 3 refills | Status: DC

## 2021-08-11 MED ORDER — METRONIDAZOLE 500 MG PO TABS: 500.0000 mg | ORAL_TABLET | Freq: Two times a day (BID) | ORAL | 0 refills | Status: AC

## 2021-08-11 MED ORDER — MEDROXYPROGESTERONE ACETATE 150 MG/ML IM SUSP
150.0000 mg | Freq: Once | INTRAMUSCULAR | Status: AC
Start: 2021-08-11 — End: 2021-08-11
  Administered 2021-08-11: 150 mg via INTRAMUSCULAR

## 2021-08-11 MED ORDER — DOXYCYCLINE HYCLATE 100 MG PO CAPS: 100.0000 mg | ORAL_CAPSULE | Freq: Two times a day (BID) | ORAL | 0 refills | Status: AC

## 2021-08-11 NOTE — Progress Notes (Signed)
    [place] Partum Visit Note  AFTIN LYE is a 26 y.o. [id number] 6/14 SVD intact perineum at 37wks after 4/8 BPP. Pregnancy c/b GDMa2, seizure d/o on lamictal and keprra, BMI 50s, resolved FGR.  Baby is doing well. Baby is feeding by both breast and bottle - enfamil  . Bleeding no bleeding. Bowel function is normal. Bladder function is normal. Patient is sexually active, last time two weeks ago. Contraception method is nothing. Patient just had a period.   Patient notes boil in the left vulva/inguinal area that started a few days.    Edinburgh Postnatal Depression Scale - 08/11/21 0956       Edinburgh Postnatal Depression Scale:  In the Past 7 Days   I have been able to laugh and see the funny side of things. 1    I have looked forward with enjoyment to things. 2    I have blamed myself unnecessarily when things went wrong. 3    I have been anxious or worried for no good reason. 3    I have felt scared or panicky for no good reason. 3    Things have been getting on top of me. 3    I have been so unhappy that I have had difficulty sleeping. 3    I have felt sad or miserable. 1    I have been so unhappy that I have been crying. 1    The thought of harming myself has occurred to me. 0    Edinburgh Postnatal Depression Scale Total 20             Review of Systems Pertinent items noted in HPI and remainder of comprehensive ROS otherwise negative.  Objective:  BP 126/85   Pulse 78   Wt (!) 316 lb (143.3 kg)   LMP 08/01/2021   Breastfeeding Yes   BMI 55.98 kg/m    General: NAD Abdomen: obese, soft, nttp EGBUS: normal. There is 2-3cm indurated, mildly ttp area in the left inguinal fold lateral to the left vulva; the overlying skin is normal, the left vulva is normal and in obvious inguinal LAD  Assessment:   Normal PP visit  Plan:  *PP: pt has used depo provera in the past and would like to do it today; injection given *PP depression: stable. Has f/u visit with [facility] Cerise in a  few days *GDMa2: GTT today *History of seizure disorder: continue lamictal and keppra  RTC: 50mfor repeat depo show  CAletha Halim MD [facility] [place] [place]

## 2021-08-12 ENCOUNTER — Encounter: Payer: Self-pay | Admitting: Obstetrics and Gynecology

## 2021-08-12 LAB — CYTOLOGY - PAP: Diagnosis: NEGATIVE

## 2021-08-12 LAB — GLUCOSE TOLERANCE, 2 HOURS
Glucose, 2 hour: 117 mg/dL (ref 70–139)
Glucose, GTT - Fasting: 90 mg/dL (ref 70–99)

## 2021-08-15 ENCOUNTER — Telehealth: Payer: Self-pay | Admitting: Lactation Services

## 2021-08-15 ENCOUNTER — Ambulatory Visit (INDEPENDENT_AMBULATORY_CARE_PROVIDER_SITE_OTHER): Payer: Medicaid Other | Admitting: Clinical

## 2021-08-15 DIAGNOSIS — F4321 Adjustment disorder with depressed mood: Secondary | ICD-10-CM

## 2021-08-15 DIAGNOSIS — F4323 Adjustment disorder with mixed anxiety and depressed mood: Secondary | ICD-10-CM

## 2021-08-15 NOTE — Telephone Encounter (Signed)
Called and spoke with patient at request of Kerby Moors.   Mom reports that she has been pretty stressed and has not been able to pump. She was previously pumping 3 x a day for 15 minutes. The milk supply was decreasing.   Infant will latch, however he gets frustrated at the the.   She reports that her milk supply came in. She reports she would BF and then offer formula and then pumping about every 2  hours. At that time she could pump 2-3 ounces per pumping. She has then slowed down her pumping due to stress.   Reviewed supply and demand and importance of emptying the breasts at least 8 times a day to promote and protect milk supply.   Infant is 93 weeks old and mainly bottle feeding. Reviewed giving infant 2 ounces in the bottle and then offering the breast.   Mom has a Symphony pump at home with # 24 flanges. She has used up to # 36 flanges. She reports her Areola are huge and the reason she was using larger flanges. She reports her nipples are the size of a nickle or a quarter. She reports her nipple is much larger after pumping. She reports a lot of tissue is pulling far into the flange. She has a # 21 flanges at home. Advised to try her # 21 flanges and try with Coconut Oil or Olive oil as needed.   Reviewed skin to skin with infant, latching if infant will, increase pumping or latching about  8 times a day.   Will call patient back tomorrow to assess pumping.

## 2021-08-15 NOTE — Patient Instructions (Addendum)
[facility] [place], [place] [address] [phone] (main office) [phone] [facility] office)  New Parent Support Groups [email or web address]  [place] primary care offices possibly accepting new patients:   Primary Care at [facility] [address] [place], [place] [address] [phone]  [facility] [place], [place] [address] [place] and [facility] [address] [place], [place] [address] [place] [address] [place], [place] [address] [phone]  Patient [place]. [place],  [place]  [phone]

## 2021-08-16 NOTE — Telephone Encounter (Signed)
Called patient to check on pumping status. She did not answer and mailbox full so not able to accept message at this time.

## 2021-08-17 NOTE — BH Specialist Note (Signed)
Integrated Behavioral Health via Telemedicine Visit  09/01/2021 DALY WHIPKEY [id number]  Number of [place] Clinician visits: 3- Third Visit  Session Start time: 7282   Session End time: 1602  Total time in minutes: 32   Referring Provider: Arlina Robes, MD Patient/Family location: [facility] Provider location: [facility] [place] [facility]  All persons participating in visit: Patient F[place]ata Kashani and [place]   Types of Service: Individual psychotherapy and Video visit  I connected with Erhard and/or Blanco  via  Telephone or Video Enabled Telemedicine Application  (Video is Caregility application) and verified that I am speaking with the correct person using two identifiers. Discussed confidentiality: Yes   I discussed the limitations of telemedicine and the availability of in person appointments.  Discussed there is a possibility of technology failure and discussed alternative modes of communication if that failure occurs.  I discussed that engaging in this telemedicine visit, they consent to the provision of behavioral healthcare and the services will be billed under their insurance.  Patient and/or legal guardian expressed understanding and consented to Telemedicine visit: Yes   Presenting Concerns: Patient and/or family reports the following symptoms/concerns: Preparing for sister's funeral service today; family conflict (one of 4 siblings); still getting very little sleep, but feeling slightly better after brief nap today.  Duration of problem: Ongoing; Severity of problem:  moderately severe  Patient and/or Family's Strengths/Protective Factors: Social connections, Concrete supports in place (healthy food, safe environments, etc.), and Sense of purpose  Goals Addressed: Patient will:  Reduce symptoms of: anxiety, depression, insomnia, and stress   Demonstrate ability to:  Increase healthy adjustment to current life circumstances and Continue healthy grieving over loss  Progress towards Goals: Ongoing  Interventions: Interventions utilized:  Supportive Reflection Standardized Assessments completed: Not Needed  Patient and/or Family Response: Patient agrees with treatment plan.   Assessment: Patient currently experiencing Grief; Adjustment disorder with mixed anxious and depressed mood.   Patient may benefit from continued therapeutic intervention today and establish care with PCP .  Plan: Follow up with behavioral health clinician on : Two weeks Behavioral recommendations:  -Continue plan to establish care with PCP to request referral to sleep study -Continue prioritizing self-care (adequate rest and regular meals) as much as able during this time -Continue plan to use walk-in hours at [facility]; accept referral to [facility] Referral(s): [place] (In Clinic)  I discussed the assessment and treatment plan with the patient and/or parent/guardian. They were provided an opportunity to ask questions and all were answered. They agreed with the plan and demonstrated an understanding of the instructions.   They were advised to call back or seek an in-person evaluation if the symptoms worsen or if the condition fails to improve as anticipated.  Caroleen Hamman Kentrail Shew, LCSW

## 2021-08-18 ENCOUNTER — Other Ambulatory Visit: Payer: Self-pay | Admitting: Advanced Practice Midwife

## 2021-08-24 ENCOUNTER — Telehealth: Payer: Self-pay | Admitting: Neurology

## 2021-08-24 MED ORDER — LAMOTRIGINE 200 MG PO TABS: 200.0000 mg | ORAL_TABLET | Freq: Three times a day (TID) | ORAL | 11 refills | Status: DC

## 2021-08-24 NOTE — Addendum Note (Signed)
Addended by: Darleen Crocker on: 08/24/2021 03:03 PM   Modules accepted: Orders

## 2021-08-24 NOTE — Telephone Encounter (Signed)
Called the pt back and she states that around 11 am time frame, her fiance' witnessed seizure activity. The sz lasted 7-8 min and it took her a bit before she became alert and responsive.   Currently she is alert and back to baseline. She has a bad headache that she states tylenol and aleve have not helped relieve the discomfort.  She is compliant with her seizure medications she states. She is currently taking Keppra 1500 mg BID and Lamotrigine 225 mg BID. Prior to this seizure the last known seizure reported was 2 months ago prior to her having her baby.  I have advised I would let Dr Felecia Shelling know and see if he thinks any medication adjustments need to be made. Pt is scheduled to see Amy next month. Pt verbalized understanding and was appreciative for the call back. Advised if we make any changes we will send to the pharmacy on file, [facility]

## 2021-08-24 NOTE — Telephone Encounter (Signed)
Pt called stating that she has just had a seizure about 30 min ago and was advised to speak to her provider or the RN. Please advise.

## 2021-08-24 NOTE — Telephone Encounter (Signed)
Called the pt and advised that Dr Felecia Shelling would like to increase to 200 mg TID. I offered if that was too much she could do 1.5 tablet BID but the main goal would be to make sure getting 600 mg total daily.  She is fine with taking TID. I called the script in for that for the patient. Pt verbalized understanding.

## 2021-08-25 ENCOUNTER — Other Ambulatory Visit: Payer: Self-pay | Admitting: Advanced Practice Midwife

## 2021-08-25 ENCOUNTER — Encounter: Payer: Self-pay | Admitting: Obstetrics and Gynecology

## 2021-08-25 ENCOUNTER — Other Ambulatory Visit: Payer: Self-pay | Admitting: *Deleted

## 2021-08-25 MED ORDER — FLUCONAZOLE 150 MG PO TABS: 150.0000 mg | ORAL_TABLET | Freq: Once | ORAL | 3 refills | Status: AC

## 2021-08-29 ENCOUNTER — Telehealth: Payer: Self-pay | Admitting: Clinical

## 2021-08-29 ENCOUNTER — Other Ambulatory Visit: Payer: Self-pay | Admitting: Advanced Practice Midwife

## 2021-08-29 DIAGNOSIS — F39 Unspecified mood [affective] disorder: Secondary | ICD-10-CM

## 2021-08-29 NOTE — Telephone Encounter (Signed)
Returned pt call "about to have a nervous breakdown, and it's like nobody cares, my anxiety is really bad, I'm starting to lose patience"; pt slept 2.5 hours last night, similar to most nights; was being seen [facility] ago at [facility] and at [facility] in the past; cannot recall last known medication, but pt's mother says she was on Zoloft (somewhat helpful); Trazadone and Ambien did not help to sleep; high dose Trazadone with another medicine(unknown which one) may have been combination that helped improve sleep, which in turn helped manage mood instability and anxiety.   Pt agrees to referral to [facility]; agrees to use [facility] walk-in hours to establish care quickly; will use Urgent Care as needed. Pt's mother will watch children while pt goes to [facility].

## 2021-08-31 ENCOUNTER — Ambulatory Visit (INDEPENDENT_AMBULATORY_CARE_PROVIDER_SITE_OTHER): Payer: Medicaid Other | Admitting: Clinical

## 2021-08-31 DIAGNOSIS — F4323 Adjustment disorder with mixed anxiety and depressed mood: Secondary | ICD-10-CM

## 2021-08-31 DIAGNOSIS — F4321 Adjustment disorder with depressed mood: Secondary | ICD-10-CM

## 2021-09-05 NOTE — BH Specialist Note (Signed)
Pt did not arrive to video visit and did not answer the phone; Left HIPPA-compliant message to call back Georgio Hattabaugh from [facility] at  [phone] (Arna Luis's office).  ?; left MyChart message for patient.  ? ?

## 2021-09-08 ENCOUNTER — Other Ambulatory Visit: Payer: Self-pay | Admitting: Lactation Services

## 2021-09-08 MED ORDER — METOCLOPRAMIDE HCL 10 MG PO TABS: 10.0000 mg | ORAL_TABLET | Freq: Three times a day (TID) | ORAL | 0 refills | Status: DC

## 2021-09-08 NOTE — Progress Notes (Signed)
Ordered Reglan for Breast milk production. Mom counseled on side effects of depression and Tardive Diskinesia and to call the office if there are any mood changes.

## 2021-09-14 ENCOUNTER — Ambulatory Visit: Payer: Medicaid Other | Admitting: Clinical

## 2021-09-14 DIAGNOSIS — Z91199 Patient's noncompliance with other medical treatment and regimen due to unspecified reason: Secondary | ICD-10-CM

## 2021-09-20 ENCOUNTER — Telehealth: Payer: Self-pay | Admitting: Family Medicine

## 2021-09-20 NOTE — Progress Notes (Deleted)
PATIENT: Latoya Stark DOB: [birth year]  REASON FOR VISIT: follow up HISTORY FROM: patient  No chief complaint on file.    HISTORY OF PRESENT ILLNESS:  09/20/21 ALL: Latoya Stark returns for follow up for seizures and headaches. She delivered a healthy baby 06/22/2021. She has continued levetiracetam '1500mg'$  BID and lamotrigine was recently increased to '200mg'$  three times daily following reported seizure 08/24/2021.   She is seeing Vesta Mixer, social worker, for adjustment disorder with anxiety and depression. She was last seen 8/23. Missed 9/6 appt.   03/15/2021 RS: Latoya Stark is a 26 y.o. woman with headaches and seizures.   She is currently 5 months pregnant.   She plans to breast x 6 months.      She brother reports she has a seizure with shaking every 2-4 weeks.   The shaking lasts one minute and she is unresponsive afterwards for several minutes.    She takes Keppra 1500 mg twice a day and lamotigine 100 mg po bid.    She had een on topiramate and it was d/c early in pregnancy (as lamotrigine added).         She also gets a lot of headaches.   Topiramate had not helped.   With a headache, she gets some nausea but has a lot of photophobia (no phonophobia).      She was having left sided numbness at last visit.   MRI 11/2020 was normal.     She gets intermittent left leg numbness since 4-5 days ago.   She also had left leg numbness when pregnant in 2020/2021    At that time (02/10/2019) she had normal MRI of the cervical, thoracic and lumbar spine).  MRi brain 02/09/2019 was also normal.     Seizure history: She has had seizures since age 45.    In 05/2018,  she was sitting in the car talking to her boyfriend and then seized (2-3 minutes GTC) and was taken to the ED,    She reports being told that she was slurring her words and not making sense for a lttle bit before the seizure.  She also complained of a headache.    She went to the ED and was placed on Keppra.   She had another  seizure 06/16/2020 She was taken to [facility] ED.   She had CT head and cervical spine and had xrays of right shoulder and lumbar spine.     She had been on Keppra 1500 mg bid.     Her level was good at 23.7 (10-40 is range)   She is having a lot of headaches.   They are located left frontal or bitemporal.   Quality is pounding.     She is having 6 sever HA a month.   She notes lights sometimes trigger them.   When present, she has photophobia (not much phonophobia), nausea and rare vomiting.  Sometimes she feels lightheaded.  Moving worsens the pain.     I personally reviewed the CT scan of the head and cervical spine from 06/16/2020.  Both are normal.  There are no acute findings.   Laboratory tests from 06/16/2020 were also reviewed.  The Tox  screen showed benzodiazepines (received some before the test due to seizure).  CMP, CBC, hCG were negative or noncontributory   Seizure History She had her first seizure at age 4.  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 She  has been having one every 2-3 months, even while on treatment.  She stopped Dilantin in 2020 (10 days or so before the seizure prompting initial consult with me) due to pregnancy.    I switched her to Keppra, initially 750 mg twice daily and she was titrated up to 1500 mg twice daily due to breakthrough activity.   She had additional breakthrough seizures in 2022 and on 06/22/2020 Topamax 100 mg twice daily was added.   HA history She has had migraine headaches since her teen years and the frequency increased in 2020 to become daily.  Generally, headaches are located bilaterally, forehead > temporal region.    She gets nausea, severe photophobia and minimal phonophobia.    Headaches usually improve if she sleeps them off.    Heat triggers the headache.  Light sometimes triggers the headaches Tylenol sometimes stops the headaches.   Topamax had helped her headaches with benefit in the  past.  Keppra initially helped the headaches..    07/31/2019 ALL: Latoya Stark is a 26 y.o. female here today for follow up for seizures. Her husband reports that she had a seizure two days ago. He reports that she called her employer and while talking she "started shaking". He reports that her whole body was shaked. Symptoms lasted for about 2 minutes. Following the seizure she was completely back to baseline. He states that there was another similar event in May. These two events are the only "seizures" she has had. Her husband reports that she is not taking medications consistently. She admits that she had missed some doses of AED in May but insists that she has taken it consistently since. Medication was last filled for 1 month and 2 refills in February. Her husband states that there is a full bottle in her car and she reports that she has 1 more refill at the pharmacy.   She reports that she has had terrible headaches over the past 3-4 months. She is working from home now. She reports seeing her PCP but no treatment was started. She is seeing OB for post partum depression.   She delivered a healthy baby boy 5 months ago.   HISTORY: (copied from Latoya note on 02/19/2019)  Latoya Stark is a 26 y.o. female here today for follow up for seizure. She was recently hospitalized for breakthrough seizure after admittedly missing two doses of her levetiracetam. She complained of right leg paralysis following seizure. Imaging was reassuring and she was cleared by neurology. Patient had positive Hoover's sign and concerns of give away weakness on exam. 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 She is doing fairly well. She is participating in OT. She is doing exercises at home. She reports that she has feeling in her leg now. She is able to walk with a walker. She was last seen by OB yesterday.    She plans to deliver her baby boy, Latoya Stark, at [facility]. She lives with her boyfriend. He  works nights. He has two children with his ex.      HISTORY: (copied from Latoya note on 11/18/2018)   Abbigale Mcelhaney Blubaugh is a 26 y.o. female here today for follow up for seizures. She is currently [redacted] weeks pregnant. She continues levetiracetam '750mg'$  TID.  She reports that she has had 2 seizures since last being seen in July.  Her fianc is here with her today and reports that her last seizure was November 2.  She was sitting  on the toilet and noted feeling hot and dizzy.  He reports that she had convulsions for approximately 2 minutes.  He states that she did urinate in the toilet while having convulsions.  He feels that the past 2 seizures have been much shorter and less severe than her previous seizures.  She continues to have migraines but feels that these have improved significantly.  She is tolerating levetiracetam well with no obvious adverse effects.  She is followed closely by OB.  Last appointment was October 23.  She is scheduled for follow-up again on November 23.   HISTORY: (copied from Dr Garth Bigness note on 08/01/2018)   I had the pleasure of seeing your patient, Jizelle Conkey, at [facility] for neurologic consultation regarding her headaches and seizures.   She is a 26 year old woman who has a long history of headaches and seizures since age 63.  She is currently 8 to [redacted] weeks pregnant.   She has had migraine headaches for yearsbut the frequency has increased over the past few months.  Headaches are daily and located bilaterally, forehead > temporal region.    She gets nausea, severe photophobia and phonophobia.    Headaches usually improve if she sleeps them off.    Heat triggers the headache.  Tylenol sometimes stops the headaches.   Topamax had helped her headaches with benefit.       She had her first seizure at age 88.  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  Her last one was early July 2020.  Her previous one  had been a month or two earlier (while on Dilantin).   She has been having one every 2-3 months, even while on treatment.  She stopped Dilantin 4 weeks ago (10 days or so before the last seizure) due to pregnancy.    She is currently 8-[redacted] weeks pregnant.        She has not tried other medications besides Dilantin.    She has seen Dr. Gaynell Face in the past.  REVIEW OF SYSTEMS: Out of a complete 14 system review of symptoms, the patient complains only of the following symptoms, headaches, seizures and all other reviewed systems are negative.  ALLERGIES: Allergies  Allergen Reactions   Hydrocodone Hives   Milk-Related Compounds Dermatitis    Breaks face out *can have 2% milk*   Mushroom Extract Complex Anaphylaxis   Penicillins Anaphylaxis    Has patient had a PCN reaction causing immediate rash, facial/tongue/throat swelling, SOB or lightheadedness with hypotension: Yes Has patient had a PCN reaction causing severe rash involving mucus membranes or skin necrosis: No Has patient had a PCN reaction that required hospitalization No Has patient had a PCN reaction occurring within the last 10 years: Yes If all of the above answers are "NO", then may proceed with Cephalosporin use.    Cheese Other (See Comments)    Breaks face out   Latex Itching and Swelling   Sulfa Antibiotics Swelling    HOME MEDICATIONS: Outpatient Medications Prior to Visit  Medication Sig Dispense Refill   metoCLOPramide (REGLAN) 10 MG tablet Take 1 tablet (10 mg total) by mouth 3 (three) times daily before meals. Take 3 times a day for 30 days,Then take 2 a day for 4 days, Then take 1 a day for 4 days, Then stop 102 tablet 0   acetaminophen (TYLENOL) 325 MG tablet Take 2 tablets (650 mg total) by mouth every 4 (four)  hours as needed (for pain scale < 4).     ferrous sulfate 325 (65 FE) MG EC tablet Take 325 mg by mouth 3 (three) times daily with meals.     ibuprofen (ADVIL) 600 MG tablet Take 1 tablet (600 mg total) by  mouth every 6 (six) hours. 30 tablet 0   lamoTRIgine (LAMICTAL) 200 MG tablet Take 1 tablet (200 mg total) by mouth 3 (three) times daily. 90 tablet 11   levETIRAcetam (KEPPRA) 750 MG tablet Take 1,500 mg by mouth 2 (two) times daily.     medroxyPROGESTERone (DEPO-PROVERA) 150 MG/ML injection Inject 1 mL (150 mg total) into the muscle every 3 (three) months. 1 mL 3   Prenatal Vit-Fe Fumarate-FA (MULTIVITAMIN-PRENATAL) 27-0.8 MG TABS tablet Take 1 tablet by mouth daily at 12 noon.     No facility-administered medications prior to visit.    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[place]) 08/01/2018   Headache    Obesity    Seizures ([place])    "when I get too hot"   Trichomonas infection    UTI (lower urinary tract infection)     PAST SURGICAL HISTORY: Past Surgical History:  Procedure Laterality Date   ADENOIDECTOMY     DILATION AND EVACUATION N/A 08/09/2013   Procedure: DILATATION AND EVACUATION;  Surgeon: Frederico Hamman, MD;  Location: [facility];  Service: Gynecology;  Laterality: N/A;   ORTHOPEDIC SURGERY Left    left hand   TONSILLECTOMY      FAMILY HISTORY: Family History  Problem Relation Age of Onset   Heart disease Mother    Cancer Mother        Bone cancer   Seizures Mother    Stroke Mother    Sickle cell anemia Mother    Hypertension Father    Seizures Maternal Aunt    Diabetes Maternal Grandmother    Colon cancer Maternal Grandmother    Hypertension Maternal Grandmother    Clotting disorder Maternal Grandmother    Breast cancer Maternal Grandmother    Stroke Maternal Grandmother    Diabetes Paternal Grandmother     SOCIAL HISTORY: Social  History   Socioeconomic History   Marital status: Significant Other    Spouse name: Not on file   Number of children: 1   Years of education: Not on file   Highest education level: Not on file  Occupational History   Not on file  Tobacco Use   Smoking status: Never   Smokeless tobacco: Never  Vaping Use   Vaping Use: Never used  Substance and Sexual Activity   Alcohol use: Not Currently    Comment: occ   Drug use: No   Sexual activity: Not Currently    Partners: Male  Other Topics Concern   Not on file  Social History Narrative   Caffeine: tea sometimes   Lives with fiance   Right handed    Social Determinants of Health   Financial Resource Strain: Low Risk  (08/14/2018)   Overall Financial Resource Strain (CARDIA)    Difficulty of Paying Living Expenses: Not hard at all  Food Insecurity: [place]  Present (08/14/2018)   Hunger Vital Sign    Worried About Running Out of Food in the Last Year: Sometimes true    Ran Out of Food in the Last Year: Sometimes true  Transportation Needs: No Transportation Needs (08/14/2018)   PRAPARE - Hydrologist (Medical): No    Lack of Transportation (Non-Medical): No  Physical Activity: Unknown (08/14/2018)   Exercise Vital Sign    Days of Exercise per Week: 0 days    Minutes of Exercise per Session: Not on file  Stress: No Stress Concern Present (08/14/2018)   [place]    Feeling of Stress : Not at all  Social Connections: Socially Isolated (08/14/2018)   Social Connection and Isolation Panel [NHANES]    Frequency of Communication with Friends and Family: More than three times a week    Frequency of Social Gatherings with Friends and Family: More than three times a week    Attends Religious Services: Never    Marine scientist or Organizations: No    Attends Archivist Meetings: Never    Marital Status: Never married   Intimate Partner Violence: Not At Risk (08/14/2018)   Humiliation, Afraid, Rape, and Kick questionnaire    Fear of Current or Ex-Partner: No    Emotionally Abused: No    Physically Abused: No    Sexually Abused: No      PHYSICAL EXAM  There were no vitals filed for this visit.  There is no height or weight on file to calculate BMI.  Generalized: Well developed, in no acute distress  Cardiology: normal rate and rhythm, no murmur noted Respiratory: clear to auscultation bilaterally  Neurological examination  Mentation: Alert oriented to time, place, history taking. Follows all commands speech and language fluent Cranial nerve II-XII: Pupils were equal round reactive to light. Extraocular movements were full, visual field were full  Motor: The motor testing reveals 5 over 5 strength of all 4 extremities. Good symmetric motor tone is noted throughout.  Gait and station: Gait is normal.    DIAGNOSTIC DATA (LABS, IMAGING, TESTING) - I reviewed patient records, labs, notes, testing and imaging myself where available.      No data to display           Lab Results  Component Value Date   WBC 6.1 06/20/2021   HGB 11.2 (L) 06/20/2021   HCT 34.0 (L) 06/20/2021   MCV 87.6 06/20/2021   PLT 293 06/20/2021      Component Value Date/Time   NA 133 (L) 05/26/2021 1637   NA 137 12/30/2020 1153   K 3.6 05/26/2021 1637   CL 104 05/26/2021 1637   CO2 22 05/26/2021 1637   GLUCOSE 102 (H) 05/26/2021 1637   BUN 5 (L) 05/26/2021 1637   BUN 5 (L) 12/30/2020 1153   CREATININE 0.53 05/26/2021 1637   CALCIUM 8.8 (L) 05/26/2021 1637   PROT 6.5 05/26/2021 1637   PROT 6.8 12/30/2020 1153   ALBUMIN 3.1 (L) 05/26/2021 1637   ALBUMIN 4.1 12/30/2020 1153   AST 22 05/26/2021 1637   ALT 26 05/26/2021 1637   ALKPHOS 117 05/26/2021 1637   BILITOT 0.3 05/26/2021 1637   BILITOT <0.2 12/30/2020 1153   GFRNONAA >60 05/26/2021 1637   GFRAA >60 02/20/2019 2329   Lab Results  Component Value Date    CHOL  12/13/2006    122        ATP  III CLASSIFICATION:  <200     mg/dL   Desirable  200-239  mg/dL   Borderline High  >=240    mg/dL   High   HDL 30 (L) 12/13/2006   LDLCALC  12/13/2006    76        Total Cholesterol/HDL:CHD Risk Coronary Heart Disease Risk Table                     Men   Women  1/2 Average Risk   3.4   3.3   TRIG 78 12/13/2006   CHOLHDL 4.1 12/13/2006   Lab Results  Component Value Date   HGBA1C 5.5 06/07/2021   Lab Results  Component Value Date   VITAMINB12 309 02/24/2014   Lab Results  Component Value Date   TSH 1.770 03/15/2018     ASSESSMENT AND PLAN 26 y.o. 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 here with   No diagnosis found.   Lotoya reports having two breakthrough seizures over the past 5 months. One in May and last one was 7/20. She admits that she has not taken her medications as prescribed. She feels that she has been fairly consistent with dosing but is unclear for what time frame. Initially, she only admitted to missing one dose of medication but her husband reports that she frequently misses either the morning or evening dose. We have reviewed dates of last refills and she agrees that missed doses have been more frequent. I have explained the importance of taking levetiracetam '1500mg'$  twice daily every day to prevent breakthrough seizures. I have also explained that this medication may help with headaches. She was encouraged to work on healthy lifestyle habits. Sleep hygiene encouraged. She is staying up late at night even though her baby is sleeping well. I  have written 1 month prescription and patient reports she has two months at home. She will return to see me in 3 months.    No orders of the defined types were placed in this encounter.    No orders of the defined types were placed in this encounter.     Debbora Presto, FNP-C 09/20/2021, 2:01 PM [facility] [address], [place], [place] [address] [phone]

## 2021-09-20 NOTE — Telephone Encounter (Signed)
Noted, appt 09/21/21 at 11am w/ AL,NP.

## 2021-09-20 NOTE — Telephone Encounter (Signed)
Pt is calling. Stated she had a seizure and it lasted 3-4 minutes. Pt was standing and boyfriend caught her before falling to floor. Pt has been feeling bad for the past two days. Pt has an appointment with Amy tomorrow.

## 2021-09-21 ENCOUNTER — Telehealth: Payer: Self-pay | Admitting: Neurology

## 2021-09-21 ENCOUNTER — Encounter: Payer: Self-pay | Admitting: Family Medicine

## 2021-09-21 ENCOUNTER — Ambulatory Visit: Payer: Medicaid Other | Admitting: Family Medicine

## 2021-09-21 DIAGNOSIS — G43709 Chronic migraine without aura, not intractable, without status migrainosus: Secondary | ICD-10-CM

## 2021-09-21 DIAGNOSIS — G40409 Other generalized epilepsy and epileptic syndromes, not intractable, without status epilepticus: Secondary | ICD-10-CM

## 2021-09-21 NOTE — Telephone Encounter (Signed)
..   Pt understands that although there may be some limitations with this type of visit, we will take all precautions to reduce any security or privacy concerns.  Pt understands that this will be treated like an in office visit and we will file with pt's insurance, and there may be a patient responsible charge related to this service. ? ?

## 2021-09-23 ENCOUNTER — Telehealth: Payer: Medicaid Other | Admitting: Family Medicine

## 2021-09-27 ENCOUNTER — Encounter: Payer: Self-pay | Admitting: Lactation Services

## 2021-09-27 ENCOUNTER — Telehealth: Payer: Self-pay | Admitting: Lactation Services

## 2021-09-27 NOTE — Telephone Encounter (Signed)
Patient called back after sending My Chart message. Have attempted to call patient several times in the last 2 weeks.   She is pumping about every 2-3 hours and milk supply is increasing.   Latoya Stark is 57 today, this is a decrease from 19 in August. She has no thoughts of self harm.   Mom reports she was informed by Roselyn Reef to go to [facility], she has not gone yet as she is fearful of being judged.   Patient is concerned that depression is still hanging on. Reviewed it can happen with some patients and needs to follow up with [facility] Provider.   Reviewed with her that the [facility] is there for the good of the patient and are there to help. Advised mom to call [place] to make an appointment for follow up. She has an appointment with Roselyn Reef next week.   Will forward to Dr. Ernestina Patches and Roselyn Reef for review.

## 2021-10-03 ENCOUNTER — Ambulatory Visit: Payer: Medicaid Other | Admitting: Clinical

## 2021-10-03 DIAGNOSIS — F4321 Adjustment disorder with depressed mood: Secondary | ICD-10-CM

## 2021-10-03 DIAGNOSIS — F430 Acute stress reaction: Secondary | ICD-10-CM
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
## 2021-10-03 NOTE — Patient Instructions (Signed)
[facility] [address] [place], [place] [address] [phone] (main office) [phone] (Nakia Koble's office)   

## 2021-10-05 ENCOUNTER — Other Ambulatory Visit: Payer: Self-pay

## 2021-10-05 ENCOUNTER — Other Ambulatory Visit: Payer: Self-pay | Admitting: Advanced Practice Midwife

## 2021-10-07 ENCOUNTER — Telehealth: Payer: Medicaid Other | Admitting: Physician Assistant

## 2021-10-07 DIAGNOSIS — M5441 Lumbago with sciatica, right side: Secondary | ICD-10-CM

## 2021-10-07 DIAGNOSIS — G8929 Other chronic pain: Secondary | ICD-10-CM

## 2021-10-07 DIAGNOSIS — M5442 Lumbago with sciatica, left side: Secondary | ICD-10-CM | POA: Diagnosis not present

## 2021-10-07 MED ORDER — CYCLOBENZAPRINE HCL 10 MG PO TABS: 5.0000 mg | ORAL_TABLET | Freq: Three times a day (TID) | ORAL | 0 refills | Status: DC | PRN

## 2021-10-07 MED ORDER — IBUPROFEN 600 MG PO TABS: 600.0000 mg | ORAL_TABLET | Freq: Four times a day (QID) | ORAL | 0 refills | Status: DC

## 2021-10-07 NOTE — Progress Notes (Signed)
We are sorry that you are not feeling well.  Here is how we plan to help!  Based on what you have shared with me it looks like you mostly have acute back pain.  Acute back pain is defined as musculoskeletal pain that can resolve in 1-3 weeks with conservative treatment.  I have prescribed Ibuprofen '600mg'$  Take 1 tablet every 8 hours as needed for pain, non-steroid anti-inflammatory (NSAID), as well as Flexeril 10 mg every eight hours as needed which is a muscle relaxer  Some patients experience stomach irritation or in increased heartburn with anti-inflammatory drugs.  Please keep in mind that muscle relaxer's can cause fatigue and should not be taken while at work or driving.  Back pain is very common.  The pain often gets better over time.  The cause of back pain is usually not dangerous.  Most people can learn to manage their back pain on their own.  Home Care Stay active.  Start with short walks on flat ground if you can.  Try to walk farther each day. Do not sit, drive or stand in one place for more than 30 minutes.  Do not stay in bed. Do not avoid exercise or work.  Activity can help your back heal faster. Be careful when you bend or lift an object.  Bend at your knees, keep the object close to you, and do not twist. Sleep on a firm mattress.  Lie on your side, and bend your knees.  If you lie on your back, put a pillow under your knees. Only take medicines as told by your doctor. Put ice on the injured area. Put ice in a plastic bag Place a towel between your skin and the bag Leave the ice on for 15-20 minutes, 3-4 times a day for the first 2-3 days. 210 After that, you can switch between ice and heat packs. Ask your doctor about back exercises or massage. Avoid feeling anxious or stressed.  Find good ways to deal with stress, such as exercise.  Get Help Right Way If: Your pain does not go away with rest or medicine. Your pain does not go away in 1 week. You have new problems. You do  not feel well. The pain spreads into your legs. You cannot control when you poop (bowel movement) or pee (urinate) You feel sick to your stomach (nauseous) or throw up (vomit) You have belly (abdominal) pain. You feel like you may pass out (faint). If you develop a fever.  Make Sure you: Understand these instructions. Will watch your condition Will get help right away if you are not doing well or get worse.  Your e-visit answers were reviewed by a board certified advanced clinical practitioner to complete your personal care plan.  Depending on the condition, your plan could have included both over the counter or prescription medications.  If there is a problem please reply  once you have received a response from your provider.  Your safety is important to [place].  If you have drug allergies check your prescription carefully.    You can use MyChart to ask questions about today's visit, request a non-urgent call back, or ask for a work or school excuse for 24 hours related to this e-Visit. If it has been greater than 24 hours you will need to follow up with your provider, or enter a new e-Visit to address those concerns.  You will get an e-mail in the next two days asking about your experience.  I  hope that your e-visit has been valuable and will speed your recovery. Thank you for using e-visits.  I provided 5 minutes of non face-to-face time during this encounter for chart review and documentation.

## 2021-10-12 ENCOUNTER — Other Ambulatory Visit: Payer: Self-pay | Admitting: Obstetrics & Gynecology

## 2021-10-24 NOTE — Telephone Encounter (Signed)
Called patient to assess status of pumping and breast feeding. Patient did not answer. Was not able to leave a message as mailbox full. Will send My Chart message.

## 2021-10-27 ENCOUNTER — Other Ambulatory Visit: Payer: Self-pay

## 2021-10-27 MED ORDER — METOCLOPRAMIDE HCL 10 MG PO TABS: 10.0000 mg | ORAL_TABLET | Freq: Three times a day (TID) | ORAL | 0 refills | Status: DC

## 2021-10-27 NOTE — Progress Notes (Signed)
Pt called requesting Rx for Nausea Rx sent

## 2021-11-03 NOTE — Telephone Encounter (Signed)
Called patient to assess pumping/breast feeding status and to inquire if she has followed up with Behavioral Health. Patient did not answer. Mailbox full and not able to leave a message. My Chart message sent.

## 2021-11-07 ENCOUNTER — Ambulatory Visit: Payer: Medicaid Other

## 2021-11-10 NOTE — Telephone Encounter (Signed)
Attempted to call patient, she did not answer. Mailbox full and not able to accept messages at this time.   Will send My Chart message again.

## 2021-11-15 ENCOUNTER — Ambulatory Visit: Payer: Medicaid Other | Admitting: Psychiatry

## 2021-11-28 NOTE — Telephone Encounter (Signed)
Called to check on breast feeding status. Patient did not answer. Mailbox full and not able to leave a message at this time. Will send My Chart message.   Per chart review, patient was prescribed Reglan again on 10/19  for Nausea.

## 2021-12-04 ENCOUNTER — Other Ambulatory Visit: Payer: Self-pay | Admitting: Obstetrics & Gynecology

## 2021-12-13 ENCOUNTER — Ambulatory Visit (INDEPENDENT_AMBULATORY_CARE_PROVIDER_SITE_OTHER): Payer: Medicaid Other

## 2021-12-13 VITALS — BP 121/88 | HR 109

## 2021-12-13 DIAGNOSIS — Z3042 Encounter for surveillance of injectable contraceptive: Secondary | ICD-10-CM

## 2021-12-13 LAB — POCT URINE PREGNANCY: Preg Test, Ur: NEGATIVE

## 2021-12-13 MED ORDER — MEDROXYPROGESTERONE ACETATE 150 MG/ML IM SUSP
150.0000 mg | INTRAMUSCULAR | Status: DC
  Administered 2021-12-13: 150 mg via INTRAMUSCULAR

## 2021-12-13 NOTE — Progress Notes (Signed)
Date last pap: 08/11/2021. Last Depo-Provera: 08/11/2021. Side Effects if any: None . Serum HCG indicated? N/A . Depo-Provera 150 mg IM given by: Toya L CMA . Next appointment due 02/20-03/06.   *Needs UPT Depo was Due 10/19-11/02

## 2021-12-14 ENCOUNTER — Ambulatory Visit: Payer: Medicaid Other | Admitting: Family Medicine

## 2022-01-19 ENCOUNTER — Encounter: Payer: Self-pay | Admitting: Family Medicine

## 2022-01-19 ENCOUNTER — Telehealth: Payer: Self-pay | Admitting: Family Medicine

## 2022-01-19 ENCOUNTER — Ambulatory Visit (INDEPENDENT_AMBULATORY_CARE_PROVIDER_SITE_OTHER): Payer: Medicaid Other | Admitting: Family Medicine

## 2022-01-19 ENCOUNTER — Ambulatory Visit (INDEPENDENT_AMBULATORY_CARE_PROVIDER_SITE_OTHER): Payer: Medicaid Other | Admitting: Neurology

## 2022-01-19 VITALS — BP 119/75 | HR 102 | Ht 65.0 in | Wt 325.0 lb

## 2022-01-19 DIAGNOSIS — G40409 Other generalized epilepsy and epileptic syndromes, not intractable, without status epilepticus: Secondary | ICD-10-CM | POA: Diagnosis not present

## 2022-01-19 DIAGNOSIS — G40909 Epilepsy, unspecified, not intractable, without status epilepticus: Secondary | ICD-10-CM | POA: Diagnosis not present

## 2022-01-19 DIAGNOSIS — G43709 Chronic migraine without aura, not intractable, without status migrainosus: Secondary | ICD-10-CM

## 2022-01-19 MED ORDER — LAMOTRIGINE 200 MG PO TABS: 200.0000 mg | ORAL_TABLET | Freq: Three times a day (TID) | ORAL | 3 refills | Status: DC

## 2022-01-19 MED ORDER — TOPIRAMATE 50 MG PO TABS: 50.0000 mg | ORAL_TABLET | Freq: Two times a day (BID) | ORAL | 3 refills | Status: DC

## 2022-01-19 MED ORDER — LEVETIRACETAM 750 MG PO TABS: 1500.0000 mg | ORAL_TABLET | Freq: Two times a day (BID) | ORAL | 3 refills | Status: DC

## 2022-01-19 NOTE — Patient Instructions (Addendum)
Below is our plan:  We will continue levetiracetam '1500mg'$  twice daily and lamotrigine '200mg'$  three times daily. I will restart topiramate '50mg'$  twice daily for headaches. It is imperative that you take your medicaitons as prescribed every day.   We will perform EEG, today. I will discuss results with Dr Felecia Shelling. We may consider admission to the epilepsy monitoring unit for characterization of seizure events.   Please make sure you are consistent with timing of seizure medication. I recommend annual visit with primary care provider (PCP) for complete physical and routine blood work. I recommend daily intake of vitamin D (400-800iu) and calcium (800-'1000mg'$ ) for bone health. Discuss Dexa screening with PCP.   According to [place] law, you can not drive unless you are seizure / syncope free for at least 6 months and under physician's care.  Please maintain precautions. Do not participate in activities where a loss of awareness could harm you or someone else. No swimming alone, no tub bathing, no hot tubs, no driving, no operating motorized vehicles (cars, ATVs, motocycles, etc), lawnmowers, power tools or firearms. No standing at heights, such as rooftops, ladders or stairs. Avoid hot objects such as stoves, heaters, open fires. Wear a helmet when riding a bicycle, scooter, skateboard, etc. and avoid areas of traffic. Set your water heater to 120 degrees or less.  Please make sure you are staying well hydrated. I recommend 50-60 ounces daily. Well balanced diet and regular exercise encouraged. Consistent sleep schedule with 6-8 hours recommended.   Please continue follow up with care team as directed.   Follow up with Dr Felecia Shelling pending EEG results.   You may receive a survey regarding today's visit. I encourage you to leave honest feed back as I do use this information to improve patient care. Thank you for seeing me today!

## 2022-01-19 NOTE — Progress Notes (Signed)
PATIENT: Latoya Stark DOB: Jan 02, 1996  REASON FOR VISIT: follow up HISTORY FROM: patient  Chief Complaint  Patient presents with   Room 2    Pt is here with her Husband. Pt states that she's been having seizures since 27 years old. Pt states that she had a Seizure today that lasted 4-5 mins. Pt states that she has migraines.     HISTORY OF PRESENT ILLNESS:  01/19/22 ALL: Latoya Stark is a 27 y.o. female here today for follow up for seizures. She was last seen by Dr Latoya Stark 03/2021 and reported continued seizure activity. She was 5 months gestation at the time. Levetiracetam continued at '1500mg'$  BID and lamotrigine increased from 100 to '200mg'$  BID. She called reporting continued seizure activity in 08/2021 and lamotrigine was increased to '200mg'$  TID. Her husband called 09/20/2021 reporting seizure lasting 3-4 minutes. Appt schedule with me 09/21/2021 but she did not show for appt. Two additional appts scheduled 09/23/21 and 12/14/21 but were cancelled.   Since, she reports she was doing better from 09/2021 until about 2 weeks ago. She was tolerating lamotrigine and levetiracetam and reportedly taking both consistently.   Her mother-in-law called today stating that she had ran out of medication about 10 days ago and had not told anyone. She reports moving to a new home and was taking old medication but stopped when she realized it was old but did not get new meds refilled. She had a seizure yesterday lasting about 3 minutes and had two seizures, today around 1:45pm lasting 4-5 minutes. Husband walked in at the end of the second event and reports her whole body was jerking. It appeared she was asleep. She was not able to communicate. She was back to baseline in 10-15 minutes.   Headaches are worsening. She reports headaches have been worse over the past two weeks. She takes Tylenol and ibuprofen with no improvement. She was on topiramate prior to pregnancy and tolerated it well.   She is currently  on depo for birth control. She is not breastfeeding. No plans for another pregnancy.   HISTORY: (copied from Dr Latoya Stark previous note)  Latoya Stark is a 27 y.o. woman with headaches and seizures.   UPDATE 03/15/2021:  She is currently 5 months pregnant.   She plans to breast x 6 months.      She brother reports she has a seizure with shaking every 2-4 weeks.   The shaking lasts one minute and she is unresponsive afterwards for several minutes.    She takes Keppra 1500 mg twice a day and lamotigine 100 mg po bid.    She had een on topiramate and it was d/c early in pregnancy (as lamotrigine added).         She also gets a lot of headaches.   Topiramate had not helped.   With a headache, she gets some nausea but has a lot of photophobia (no phonophobia).      She was having left sided numbness at last visit.   MRI 11/2020 was normal.     She gets intermittent left leg numbness since 4-5 days ago.   She also had left leg numbness when pregnant in 2020/2021    At that time (02/10/2019) she had normal MRI of the cervical, thoracic and lumbar spine).  MRi brain 02/09/2019 was also normal.     Seizure history: She has had seizures since age 70.    In 05/2018,  she was sitting in the  car talking to her boyfriend and then seized (2-3 minutes GTC) and was taken to the ED,    She reports being told that she was slurring her words and not making sense for a lttle bit before the seizure.  She also complained of a headache.    She went to the ED and was placed on Keppra.   She had another seizure 6/8/2022She was taken to [facility] ED.   She had CT head and cervical spine and had xrays of right shoulder and lumbar spine.     She had been on Keppra 1500 mg bid.     Her level was good at 23.7 (10-40 is range)   She is having a lot of headaches.   They are located left frontal or bitemporal.   Quality is pounding.     She is having 6 sever HA a month.   She notes lights sometimes trigger them.   When present, she has  photophobia (not much phonophobia), nausea and rare vomiting.  Sometimes she feels lightheaded.  Moving worsens the pain.       I personally reviewed the CT scan of the head and cervical spine from 06/16/2020.  Both are normal.  There are no acute findings.   Laboratory tests from 06/16/2020 were also reviewed.  The Tox  screen showed benzodiazepines (received some before the test due to seizure).  CMP, CBC, hCG were negative or noncontributory   Seizure History She had her first seizure at age 25.  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    She has been having one every 2-3 months, even while on treatment.  She stopped Dilantin in 2020 (10 days or so before the seizure prompting initial consult with me) due to pregnancy.    I switched her to Keppra, initially 750 mg twice daily and she was titrated up to 1500 mg twice daily due to breakthrough activity.   She had additional breakthrough seizures in 2022 and on 06/22/2020 Topamax 100 mg twice daily was added.   HA history She has had migraine headaches since her teen years and the frequency increased in 2020 to become daily.  Generally, headaches are located bilaterally, forehead > temporal region.    She gets nausea, severe photophobia and minimal phonophobia.    Headaches usually improve if she sleeps them off.    Heat triggers the headache.  Light sometimes triggers the headaches Tylenol sometimes stops the headaches.   Topamax had helped her headaches with benefit in the past.  Keppra initially helped the headaches.   REVIEW OF SYSTEMS: Out of a complete 14 system review of symptoms, the patient complains only of the following symptoms, headaches, seizures and all other reviewed systems are negative.   ALLERGIES: Allergies  Allergen Reactions   Hydrocodone Hives   Milk-Related Compounds Dermatitis    Breaks face out *can have 2% milk*   Mushroom Extract Complex Anaphylaxis    Penicillins Anaphylaxis    Has patient had a PCN reaction causing immediate rash, facial/tongue/throat swelling, SOB or lightheadedness with hypotension: Yes Has patient had a PCN reaction causing severe rash involving mucus membranes or skin necrosis: No Has patient had a PCN reaction that required hospitalization No Has patient had a PCN reaction occurring within the last 10 years: Yes If all of the above answers are "NO", then may proceed with Cephalosporin use.    Cheese Other (See Comments)    Breaks  face out   Latex Itching and Swelling   Sulfa Antibiotics Swelling    HOME MEDICATIONS: Outpatient Medications Prior to Visit  Medication Sig Dispense Refill   acetaminophen (TYLENOL) 325 MG tablet Take 2 tablets (650 mg total) by mouth every 4 (four) hours as needed (for pain scale < 4).     cyclobenzaprine (FLEXERIL) 10 MG tablet Take 0.5-1 tablets (5-10 mg total) by mouth 3 (three) times daily as needed. 30 tablet 0   ferrous sulfate 325 (65 FE) MG EC tablet Take 325 mg by mouth 3 (three) times daily with meals.     ibuprofen (ADVIL) 600 MG tablet Take 1 tablet (600 mg total) by mouth every 6 (six) hours. 30 tablet 0   medroxyPROGESTERone (DEPO-PROVERA) 150 MG/ML injection Inject 1 mL (150 mg total) into the muscle every 3 (three) months. 1 mL 3   metoCLOPramide (REGLAN) 10 MG tablet Take 1 tablet (10 mg total) by mouth 3 (three) times daily before meals. Take 3 times a day for 30 days,Then take 2 a day for 4 days, Then take 1 a day for 4 days, Then stop 102 tablet 0   Prenatal Vit-Fe Fumarate-FA (MULTIVITAMIN-PRENATAL) 27-0.8 MG TABS tablet Take 1 tablet by mouth daily at 12 noon.     lamoTRIgine (LAMICTAL) 200 MG tablet Take 1 tablet (200 mg total) by mouth 3 (three) times daily. 90 tablet 11   levETIRAcetam (KEPPRA) 750 MG tablet Take 1,500 mg by mouth 2 (two) times daily.     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[place]) 08/01/2018   Headache    Obesity    Seizures ([place])    "when I get too hot"   Trichomonas infection    UTI (lower urinary tract infection)     PAST SURGICAL HISTORY: Past Surgical History:  Procedure Laterality Date   ADENOIDECTOMY     DILATION AND EVACUATION N/A 08/09/2013   Procedure: DILATATION AND EVACUATION;  Surgeon: Frederico Hamman, MD;  Location: [facility];  Service: Gynecology;  Laterality: N/A;   ORTHOPEDIC SURGERY Left    left hand   TONSILLECTOMY      FAMILY HISTORY: Family History  Problem Relation Age of Onset   Heart disease Mother    Cancer Mother        Bone cancer   Seizures Mother    Stroke Mother    Sickle cell anemia Mother    Hypertension Father    Seizures Maternal Aunt    Diabetes Maternal Grandmother    Colon cancer Maternal Grandmother    Hypertension Maternal Grandmother    Clotting disorder Maternal Grandmother    Breast cancer Maternal Grandmother    Stroke Maternal Grandmother    Diabetes Paternal Grandmother     SOCIAL HISTORY: Social History   Socioeconomic History   Marital status: Significant Other    Spouse name: Not on file  Number of children: 1   Years of education: Not on file   Highest education level: Not on file  Occupational History   Not on file  Tobacco Use   Smoking status: Never   Smokeless tobacco: Never  Vaping Use   Vaping Use: Never used  Substance and Sexual Activity   Alcohol use: Not Currently     Comment: occ   Drug use: No   Sexual activity: Not Currently    Partners: Male  Other Topics Concern   Not on file  Social History Narrative   Caffeine: tea sometimes   Lives with fiance   Right handed    Social Determinants of Health   Financial Resource Strain: Low Risk  (08/14/2018)   Overall Financial Resource Strain (CARDIA)    Difficulty of Paying Living Expenses: Not hard at all  Food Insecurity: Food Insecurity Present (08/14/2018)   Hunger Vital Sign    Worried About [place] in the Last Year: Sometimes true    Ran Out of Food in the Last Year: Sometimes true  Transportation Needs: No Transportation Needs (08/14/2018)   PRAPARE - Hydrologist (Medical): No    Lack of Transportation (Non-Medical): No  Physical Activity: Unknown (08/14/2018)   Exercise Vital Sign    Days of Exercise per Week: 0 days    Minutes of Exercise per Session: Not on file  Stress: No Stress Concern Present (08/14/2018)   [place]    Feeling of Stress : Not at all  Social Connections: Socially Isolated (08/14/2018)   Social Connection and Isolation Panel [NHANES]    Frequency of Communication with Friends and Family: More than three times a week    Frequency of Social Gatherings with Friends and Family: More than three times a week    Attends Religious Services: Never    Marine scientist or Organizations: No    Attends Archivist Meetings: Never    Marital Status: Never married  Intimate Partner Violence: Not At Risk (08/14/2018)   Humiliation, Afraid, Rape, and Kick questionnaire    Fear of Current or Ex-Partner: No    Emotionally Abused: No    Physically Abused: No    Sexually Abused: No      PHYSICAL EXAM  Vitals:   01/19/22 1507  BP: 119/75  Pulse: (!) 102  Weight: (!) 325 lb (147.4 kg)  Height: '5\' 5"'$  (1.651 m)    Body mass index is 54.08 kg/m.  Generalized: Well  developed, in no acute distress  Cardiology: normal rate and rhythm, no murmur noted Respiratory: clear to auscultation bilaterally  Neurological examination  Mentation: Alert oriented to time, place, history taking. Follows all commands speech and language fluent Cranial nerve II-XII: Pupils were equal round reactive to light. Extraocular movements were full, visual field were full  Motor: The motor testing reveals 5 over 5 strength of all 4 extremities. Good symmetric motor tone is noted throughout.  Gait and station: Gait is normal.    DIAGNOSTIC DATA (LABS, IMAGING, TESTING) - I reviewed patient records, labs, notes, testing and imaging myself where available.      No data to display           Lab Results  Component Value Date   WBC 6.1 06/20/2021   HGB 11.2 (L) 06/20/2021   HCT 34.0 (L) 06/20/2021   MCV 87.6 06/20/2021   PLT 293 06/20/2021  Component Value Date/Time   NA 133 (L) 05/26/2021 1637   NA 137 12/30/2020 1153   K 3.6 05/26/2021 1637   CL 104 05/26/2021 1637   CO2 22 05/26/2021 1637   GLUCOSE 102 (H) 05/26/2021 1637   BUN 5 (L) 05/26/2021 1637   BUN 5 (L) 12/30/2020 1153   CREATININE 0.53 05/26/2021 1637   CALCIUM 8.8 (L) 05/26/2021 1637   PROT 6.5 05/26/2021 1637   PROT 6.8 12/30/2020 1153   ALBUMIN 3.1 (L) 05/26/2021 1637   ALBUMIN 4.1 12/30/2020 1153   AST 22 05/26/2021 1637   ALT 26 05/26/2021 1637   ALKPHOS 117 05/26/2021 1637   BILITOT 0.3 05/26/2021 1637   BILITOT <0.2 12/30/2020 1153   GFRNONAA >60 05/26/2021 1637   GFRAA >60 02/20/2019 2329   Lab Results  Component Value Date   CHOL  12/13/2006    122        ATP III CLASSIFICATION:  <200     mg/dL   Desirable  200-239  mg/dL   Borderline High  >=240    mg/dL   High   HDL 30 (L) 12/13/2006   LDLCALC  12/13/2006    76        Total Cholesterol/HDL:CHD Risk Coronary Heart Disease Risk Table                     Men   Women  1/2 Average Risk   3.4   3.3   TRIG 78 12/13/2006    CHOLHDL 4.1 12/13/2006   Lab Results  Component Value Date   HGBA1C 5.5 06/07/2021   Lab Results  Component Value Date   VITAMINB12 309 02/24/2014   Lab Results  Component Value Date   TSH 1.770 03/15/2018     ASSESSMENT AND PLAN 27 y.o. 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 here with     ICD-10-CM   1. 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 Chronic migraine without aura without status migrainosus, not intractable  G43.709        Jalaya reports having two breakthrough seizures over the past 24 hours in setting of medication non compliance. She was doing better with increased dose of lamotrigine but reports three breakthrough seizures from 09/2021-12/2021. We will perform EEG, today. I have explained the importance of taking levetiracetam '1500mg'$  twice daily and lamotrigine '200mg'$  three times daily, every day, to prevent breakthrough seizures.I will restart topiramate '50mg'$  BID for headache prevention. May give added seizure control. May consider EMU admission pending EEG results. She was encouraged to work on healthy lifestyle habits. Sleep hygiene encouraged. She will follow up pending workup.    Orders Placed This Encounter  Procedures   EEG adult    Standing Status:   Future    Number of Occurrences:   1    Standing Expiration Date:   01/20/2023    Order Specific Question:   Where should this test be performed?    Answer:   [facility]    Order Specific Question:    Reason  for exam    Answer:   Seizure     Meds ordered this encounter  Medications   levETIRAcetam (KEPPRA) 750 MG tablet    Sig: Take 2 tablets (1,500 mg total) by mouth 2 (two) times daily.    Dispense:  360 tablet    Refill:  3    Order Specific Question:   Supervising Provider    Answer:   Melvenia Beam [[id number]]   lamoTRIgine (LAMICTAL) 200 MG tablet    Sig: Take 1 tablet (200 mg total) by mouth 3 (three) times daily.    Dispense:  270 tablet    Refill:  3    Order Specific Question:   Supervising Provider    Answer:   Melvenia Beam [[id number]]   topiramate (TOPAMAX) 50 MG tablet    Sig: Take 1 tablet (50 mg total) by mouth 2 (two) times daily.    Dispense:  180 tablet    Refill:  3    Order Specific Question:   Supervising Provider    Answer:   Melvenia Beam [id number]      I spent 30 minutes with the patient. 50% of this time was spent counseling and educating patient on plan of care and medications.     Debbora Presto, FNP-C 01/19/2022, 4:03 PM [facility] [address], [place], [place] [address] [phone]

## 2022-01-19 NOTE — Telephone Encounter (Signed)
Pt's mother called stating that the pt's fiance called her and informed her that the pt did not inform him that she had run out of her medication and therefore yesterday she had a seizure and today she had two, one at 10:30am that lasted about 3 minutes and another one at about 1:45pm that lasted about 5-8 minutes. I informed the pt's mother that I will update the provider and they will discuss at the appt pt has today at 3pm. Mother verbalized understanding and stated that they will be here.

## 2022-01-26 ENCOUNTER — Telehealth: Payer: Self-pay | Admitting: Family Medicine

## 2022-01-26 MED ORDER — GABAPENTIN 100 MG PO CAPS: 100.0000 mg | ORAL_CAPSULE | Freq: Every day | ORAL | 3 refills | Status: DC

## 2022-01-26 NOTE — Telephone Encounter (Signed)
We can discontinue topiramate and start gabapentin '100mg'$  at bedtime. Remind her that it can make her sleepy so be cautious when having to care for her children. I advise starting it at a time where her husband or family can be with her the first few nights. We may need to increase dose for maximum benefit but will start low and increase slowly. We will review EEG results as soon as they are read by doc.

## 2022-01-26 NOTE — Telephone Encounter (Signed)
Pt is asking for a call with results to EEG.  Pt asked that it be added to message that she believes the topiramate (TOPAMAX) 50 MG tablet , is making her head hurt more.  Please call pt to discuss.

## 2022-01-26 NOTE — Telephone Encounter (Signed)
Called pt. Relayed we have not gotten EEG results back from MD but will call once we do.   Pt states when she was on topiramate in the past, made her head hurt more.  She forgot about this. Confirmed she is taking '50mg'$  po BID. She does not feel medication is helping migraines. She wants to know if there is actually another medication that Amy Lomax,NP would recommend to help her migraines. Feels when her migraines worsen, this worsens seizure episodes.

## 2022-01-26 NOTE — Telephone Encounter (Signed)
Called and spoke with pt. Relayed AL,NP plan. She is agreeable to plan. She will call back if she has any issues tolerating gabapentin. Aware we will call back with EEG results once available.

## 2022-01-26 NOTE — Telephone Encounter (Signed)
Amy, looks like pt completed EEG at our office after visit on 01/19/22. Do you have these results? Any thoughts on topamax?

## 2022-01-26 NOTE — Addendum Note (Signed)
Addended byUbaldo Glassing, Yajaira Doffing L on: 01/26/2022 02:07 PM   Modules accepted: Orders

## 2022-01-27 NOTE — Progress Notes (Signed)
   [facility]  EEG (ELECTROENCEPHALOGRAM) REPORT   STUDY DATE: 1/11/20241/11/2022 PATIENT NAME: Latoya Stark DOB: October 24, 1995 MRN: [id number]  ORDERING CLINICIAN: Debbora Presto, NP; Nanine Means. Felecia Shelling, MD. PhD  TECHNOLOGIST: Wyman Songster, REEGT TECHNIQUE: Electroencephalogram was recorded utilizing standard 10-20 system of lead placement and reformatted into average and bipolar montages.  RECORDING TIME:  23 minutes 34 sec  CLINICAL INFORMATION: 27 year old woman with a seizure   FINDINGS: A digital EEG was performed while the patient was awake and drowsy. While awake and most alert there was a 11 hz posterior dominant rhythm. Voltages and frequencies were symmetric.  There were no focal, lateralizing, epileptiform activity or seizures seen.  Photic stimulation had a normal driving response. Hyperventilation and recovery did not change the underlying rhythms. EKG channel shows NSR.   She briefly was drowsy but no sleep was recorded  .  IMPRESSION: This is a normal EEG while the patient was awake and drowsy.   INTERPRETING PHYSICIAN:   Tiyon Sanor A. Felecia Shelling, MD, PhD, [facility] Certified in Neurology, Clinical Neurophysiology, Sleep Medicine, Pain Medicine and Neuroimaging  [facility] [address], [place], [place] [address] [phone]

## 2022-02-01 NOTE — Addendum Note (Signed)
Addended by: Debbora Presto L on: 02/01/2022 10:44 AM   Modules accepted: Orders

## 2022-02-02 NOTE — Telephone Encounter (Signed)
Called pt and relayed results per AL,NP note. Pt verbalized understanding. She will upload front/back of new insurance cards via [employer] and will send message once complete.

## 2022-02-10 ENCOUNTER — Emergency Department (HOSPITAL_BASED_OUTPATIENT_CLINIC_OR_DEPARTMENT_OTHER)
Admission: EM | Admit: 2022-02-10 | Discharge: 2022-02-10 | Disposition: A | Discharge status: Home / Self Care | Payer: Medicaid Other | Attending: Emergency Medicine | Admitting: Emergency Medicine

## 2022-02-10 ENCOUNTER — Other Ambulatory Visit: Payer: Self-pay

## 2022-02-10 ENCOUNTER — Encounter (HOSPITAL_BASED_OUTPATIENT_CLINIC_OR_DEPARTMENT_OTHER): Payer: Self-pay

## 2022-02-10 DIAGNOSIS — Z91040 Latex allergy status: Secondary | ICD-10-CM | POA: Insufficient documentation

## 2022-02-10 DIAGNOSIS — K0889 Other specified disorders of teeth and supporting structures: Secondary | ICD-10-CM

## 2022-02-10 MED ORDER — OXYCODONE-ACETAMINOPHEN 5-325 MG PO TABS
1.0000 | ORAL_TABLET | ORAL | Status: DC | PRN
  Administered 2022-02-10: 1 via ORAL
  Filled 2022-02-10: qty 1

## 2022-02-10 MED ORDER — OXYCODONE-ACETAMINOPHEN 5-325 MG PO TABS: 1.0000 | ORAL_TABLET | Freq: Four times a day (QID) | ORAL | 0 refills | Status: DC | PRN

## 2022-02-10 NOTE — Discharge Instructions (Addendum)
You were evaluated today for pain secondary to a wisdom tooth dry socket.  I prescribed Percocet for pain.  Please take as directed.  Please follow-up with your dentist on Monday for further evaluation and management.  Do not exceed 4000 mg of acetaminophen from all sources in any 24-hour period

## 2022-02-10 NOTE — ED Triage Notes (Signed)
Pt presents POV from home   Pt reports having her Right wisdom tooth extracted Monday. Pt did not receive any pain medication. Pt taking OTC pain meds  Pt reports excessive saliva. Went back to her dentist and he dx her with a dry socket. Pt also reports having a headache.  Pt is here for pain control.

## 2022-02-10 NOTE — ED Provider Notes (Signed)
[facility] Provider Note   CSN: [id number] Arrival date & time: 02/10/22  2018     History  Chief Complaint  Patient presents with   Dental Pain    Latoya Stark is a 27 y.o. female.  Patient presents the emergency department complaining of right upper jaw pain secondary to packing by dentistry earlier today.  Patient states she had her right upper wisdom tooth pulled on Monday.  She went today for follow-up and they stated she may have a dry socket.  They packed the socket.  She states that she has been having increased pain since that time.  She states that dentistry prescribed her no pain medication.  She is here for pain control.  Past medical history significant for obesity, conversion disorder, migraines, seizure disorder, anxiety  HPI     Home Medications Prior to Admission medications   Medication Sig Start Date End Date Taking? Authorizing Provider  oxyCODONE-acetaminophen (PERCOCET/ROXICET) 5-325 MG tablet Take 1 tablet by mouth every 6 (six) hours as needed for severe pain. 02/10/22  Yes Dorothyann Peng, PA-C  acetaminophen (TYLENOL) 325 MG tablet Take 2 tablets (650 mg total) by mouth every 4 (four) hours as needed (for pain scale < 4). 06/24/21   Cresenzo-Dishmon, Joaquim Lai, CNM  cyclobenzaprine (FLEXERIL) 10 MG tablet Take 0.5-1 tablets (5-10 mg total) by mouth 3 (three) times daily as needed. 10/07/21   Mar Daring, PA-C  ferrous sulfate 325 (65 FE) MG EC tablet Take 325 mg by mouth 3 (three) times daily with meals.    [provider]  gabapentin (NEURONTIN) 100 MG capsule Take 1 capsule (100 mg total) by mouth at bedtime. 01/26/22   Lomax, Amy, NP  ibuprofen (ADVIL) 600 MG tablet Take 1 tablet (600 mg total) by mouth every 6 (six) hours. 10/07/21   Mar Daring, PA-C  lamoTRIgine (LAMICTAL) 200 MG tablet Take 1 tablet (200 mg total) by mouth 3 (three) times daily. 01/19/22   Lomax, Amy, NP  levETIRAcetam  (KEPPRA) 750 MG tablet Take 2 tablets (1,500 mg total) by mouth 2 (two) times daily. 01/19/22   Lomax, Amy, NP  medroxyPROGESTERone (DEPO-PROVERA) 150 MG/ML injection Inject 1 mL (150 mg total) into the muscle every 3 (three) months. 08/11/21   Aletha Halim, MD  metoCLOPramide (REGLAN) 10 MG tablet Take 1 tablet (10 mg total) by mouth 3 (three) times daily before meals. Take 3 times a day for 30 days,Then take 2 a day for 4 days, Then take 1 a day for 4 days, Then stop 10/27/21   Donnamae Jude, MD  Prenatal Vit-Fe Fumarate-FA (MULTIVITAMIN-PRENATAL) 27-0.8 MG TABS tablet Take 1 tablet by mouth daily at 12 noon.    [provider]  topiramate (TOPAMAX) 50 MG tablet Take 1 tablet (50 mg total) by mouth 2 (two) times daily. 01/19/22   Lomax, Amy, NP      Allergies    Hydrocodone, Milk-related compounds, Mushroom extract complex, Penicillins, Cheese, Latex, and Sulfa antibiotics    Review of Systems   Review of Systems  HENT:  Positive for dental problem.     Physical Exam Updated Vital Signs BP (!) 140/72 (BP Location: Right Arm)   Pulse 96   Temp 97.6 F (36.4 C)   Resp (!) 22   SpO2 100%  Physical Exam Constitutional:      Appearance: She is obese.  HENT:     Head: Normocephalic and atraumatic.     Mouth/Throat:  Eyes:     Pupils: Pupils are equal, round, and reactive to light.  Pulmonary:     Effort: Pulmonary effort is normal. No respiratory distress.  Musculoskeletal:        General: No signs of injury.     Cervical back: Normal range of motion.  Skin:    General: Skin is dry.  Neurological:     Mental Status: She is alert.  Psychiatric:        Speech: Speech normal.        Behavior: Behavior normal.     ED Results / Procedures / Treatments   Labs (all labs ordered are listed, but only abnormal results are displayed) Labs Reviewed - No data to display  EKG None  Radiology No results found.  Procedures Procedures    Medications Ordered in  ED Medications  oxyCODONE-acetaminophen (PERCOCET/ROXICET) 5-325 MG per tablet 1 tablet (1 tablet Oral Given 02/10/22 2118)    ED Course/ Medical Decision Making/ A&P                             Medical Decision Making Risk Prescription drug management.   Patient presents the emergency room with a chief complaint of dental pain secondary to wisdom tooth extraction.  There is no relevant past medical history available for review.  There is medication at this time for lab work or imaging  Discussed pain management with the patient.  Patient was not prescribed any pain medication by her dentist today.  Patient does seem to be having severe pain.  I explained that I did not feel comfortable pulling the packing for the patient's socket as dentistry had this there for a reason.  Recommended patient follow-up with dentistry on Monday for further evaluation of the pain and with questions about packing removal.  I I offered to prescribe a course of pain medication to get the patient the weekend until she could follow-up with dentistry.  Patient voiced understanding.  Percocet prescribed.        Final Clinical Impression(s) / ED Diagnoses Final diagnoses:  Pain, dental    Rx / DC Orders ED Discharge Orders          Ordered    oxyCODONE-acetaminophen (PERCOCET/ROXICET) 5-325 MG tablet  Every 6 hours PRN        02/10/22 2314              Ronny Bacon 02/10/22 2315    Cristie Hem, MD 02/11/22 [phone]

## 2022-02-15 ENCOUNTER — Ambulatory Visit (INDEPENDENT_AMBULATORY_CARE_PROVIDER_SITE_OTHER): Payer: Medicaid Other | Admitting: Family Medicine

## 2022-02-15 ENCOUNTER — Other Ambulatory Visit (HOSPITAL_COMMUNITY)
Admission: RE | Admit: 2022-02-15 | Discharge: 2022-02-15 | Disposition: A | Discharge status: Home / Self Care | Payer: Medicaid Other | Source: Ambulatory Visit | Attending: Family Medicine | Admitting: Family Medicine

## 2022-02-15 VITALS — BP 129/92 | HR 98 | Wt 325.0 lb

## 2022-02-15 DIAGNOSIS — B3731 Acute candidiasis of vulva and vagina: Secondary | ICD-10-CM

## 2022-02-15 DIAGNOSIS — N939 Abnormal uterine and vaginal bleeding, unspecified: Secondary | ICD-10-CM | POA: Insufficient documentation

## 2022-02-15 LAB — POCT URINALYSIS DIPSTICK

## 2022-02-15 NOTE — Progress Notes (Signed)
   GYNECOLOGY PROBLEM  VISIT ENCOUNTER NOTE  Subjective:   Latoya Stark is a 27 y.o. [phone] female here for a problem GYN visit.  Current complaints: last depo in Dec. Reports having tooth removed with dry socket and infection, was placed on abx and after that she started having spotting . Reports brown spotting, no urinary ss.   Denies abnormal vaginal discharge, pelvic pain, problems with intercourse or other gynecologic concerns.    Gynecologic History Patient's last menstrual period was 02/09/2022 (approximate).  Contraception: Depo-Provera injections  Health Maintenance Due  Topic Date Due   INFLUENZA VACCINE  08/09/2021   COVID-19 Vaccine (3 - 2023-24 season) 09/09/2021    The following portions of the patient's history were reviewed and updated as appropriate: allergies, current medications, past family history, past medical history, past social history, past surgical history and problem list.  Review of Systems Pertinent items are noted in HPI.   Objective:  BP (!) 129/92   Pulse 98   Wt (!) 325 lb (147.4 kg)   LMP 02/09/2022 (Approximate)   BMI 54.08 kg/m  Gen: well appearing, NAD HEENT: no scleral icterus CV: RR Lung: Normal WOB Ext: warm well perfused  PELVIC: Normal appearing external genitalia; normal appearing vaginal mucosa and cervix.  No abnormal discharge noted.   Normal uterine size, no other palpable masses, no uterine or adnexal tenderness.   Assessment and Plan:  1. Vaginal spotting Likely related to depo Could be infection, discussed swab to r/o - Cervicovaginal ancillary only    Please refer to After Visit Summary for other counseling recommendations.   No follow-ups on file.  Caren Macadam, MD, MPH, ABFM Attending [place] for [facility]

## 2022-02-15 NOTE — Progress Notes (Signed)
CC: Blood with wiping x 1 week  Denies any dysuria, urinary frequency Denies any back pain

## 2022-02-17 LAB — CERVICOVAGINAL ANCILLARY ONLY
Bacterial Vaginitis (gardnerella): NEGATIVE
Candida Glabrata: NEGATIVE
Candida Vaginitis: POSITIVE — AB
Chlamydia: NEGATIVE
Comment: NEGATIVE
Comment: NEGATIVE
Comment: NEGATIVE
Comment: NEGATIVE
Comment: NEGATIVE
Comment: NORMAL
Neisseria Gonorrhea: NEGATIVE
Trichomonas: NEGATIVE

## 2022-02-18 ENCOUNTER — Other Ambulatory Visit: Payer: Self-pay | Admitting: Advanced Practice Midwife

## 2022-02-18 ENCOUNTER — Other Ambulatory Visit: Payer: Self-pay | Admitting: Physician Assistant

## 2022-02-18 DIAGNOSIS — G8929 Other chronic pain: Secondary | ICD-10-CM

## 2022-02-20 ENCOUNTER — Encounter: Payer: Self-pay | Admitting: Family Medicine

## 2022-02-20 MED ORDER — CLOTRIMAZOLE 1 % VA CREA: 1.0000 | TOPICAL_CREAM | Freq: Every day | VAGINAL | 2 refills | Status: DC

## 2022-02-20 NOTE — Addendum Note (Signed)
Addended by: Caryl Bis on: 02/20/2022 08:42 AM   Modules accepted: Orders

## 2022-02-21 ENCOUNTER — Telehealth: Payer: Self-pay | Admitting: Family Medicine

## 2022-02-21 ENCOUNTER — Telehealth: Payer: Self-pay

## 2022-02-21 NOTE — Telephone Encounter (Signed)
Can you guys see if she was able to schedule 72hr EEG. TY!

## 2022-02-21 NOTE — Telephone Encounter (Signed)
Called pt and asked her if she was able to get her 72hour EEG and she said that she received a call this morning that they were waiting on her insurance to approve it. I told pt that soon as it is scheduled to reach back out to [place]. Pt verbalized understanding.

## 2022-02-22 NOTE — Telephone Encounter (Signed)
Called pt and asked her if she had set up her 72hour EEG. Pt states that she received a call for the EEG but that they are waiting on her insurance to approve it.

## 2022-03-03 ENCOUNTER — Ambulatory Visit: Payer: Medicaid Other

## 2022-03-09 ENCOUNTER — Ambulatory Visit (INDEPENDENT_AMBULATORY_CARE_PROVIDER_SITE_OTHER): Payer: Medicaid Other | Admitting: Obstetrics and Gynecology

## 2022-03-09 VITALS — BP 128/85 | HR 106 | Wt 324.0 lb

## 2022-03-09 DIAGNOSIS — K429 Umbilical hernia without obstruction or gangrene: Secondary | ICD-10-CM

## 2022-03-09 DIAGNOSIS — Z3042 Encounter for surveillance of injectable contraceptive: Secondary | ICD-10-CM | POA: Diagnosis not present

## 2022-03-09 MED ORDER — MEDROXYPROGESTERONE ACETATE 150 MG/ML IM SUSP: 150.0000 mg | INTRAMUSCULAR | 3 refills | Status: DC

## 2022-03-09 MED ORDER — MEDROXYPROGESTERONE ACETATE 150 MG/ML IM SUSY
150.0000 mg | PREFILLED_SYRINGE | INTRAMUSCULAR | Status: DC
  Administered 2022-03-09: 150 mg via INTRAMUSCULAR

## 2022-03-09 NOTE — Progress Notes (Signed)
Obstetrics and Gynecology Visit Return Patient Evaluation  Appointment Date: 03/09/2022  Primary Care Provider: Pcp, No  OBGYN Clinic: [facility] Complaint: peri-umbilical pain  History of Present Illness:  Latoya Stark is a 27 y.o. patient was told when she was pregnant she had a mass at her umbilicus but recently she's noticed discomfort and pain there. She states she notices it when she flexes/physical activity  No bowel or bladder s/s, fevers, chills, current pain.   Review of Systems: as noted in the History of Present Illness.  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 Current Outpatient Medications  Medication Sig Dispense Refill   acetaminophen (TYLENOL) 325 MG tablet Take 2 tablets (650 mg total) by mouth every 4 (four) hours as needed (for pain scale < 4).     clotrimazole (GYNE-LOTRIMIN) 1 % vaginal cream Place 1 Applicatorful vaginally at bedtime. 30 g 2   ferrous sulfate 325 (65 FE) MG EC tablet Take 325 mg by mouth 3 (three) times daily with meals.     gabapentin (NEURONTIN) 100 MG capsule Take 1 capsule (100 mg total) by mouth at bedtime. 90 capsule 3   lamoTRIgine (LAMICTAL) 200 MG tablet Take 1 tablet (200 mg total) by mouth 3 (three) times daily. 270 tablet 3   levETIRAcetam (KEPPRA) 750 MG tablet Take 2 tablets (1,500 mg total) by mouth 2 (two) times daily. 360 tablet 3   medroxyPROGESTERone (DEPO-PROVERA) 150 MG/ML  injection Inject 1 mL (150 mg total) into the muscle every 3 (three) months. 1 mL 3   metoCLOPramide (REGLAN) 10 MG tablet Take 1 tablet (10 mg total) by mouth 3 (three) times daily before meals. Take 3 times a day for 30 days,Then take 2 a day for 4 days, Then take 1 a day for 4 days, Then stop 102 tablet 0   Prenatal Vit-Fe Fumarate-FA (MULTIVITAMIN-PRENATAL) 27-0.8 MG TABS tablet Take 1 tablet by mouth daily at 12 noon.     topiramate (TOPAMAX) 50 MG tablet Take 1 tablet (50 mg total) by mouth 2 (two) times daily. 180 tablet 3   cyclobenzaprine (FLEXERIL) 10 MG tablet Take 0.5-1 tablets (5-10 mg total) by mouth 3 (three) times daily as needed. (Patient not taking: Reported on 02/15/2022) 30 tablet 0   ibuprofen (ADVIL) 600 MG tablet Take 1 tablet (600 mg total) by mouth every 6 (six) hours. (Patient not taking: Reported on 02/15/2022) 30 tablet 0   oxyCODONE-acetaminophen (PERCOCET/ROXICET) 5-325 MG tablet Take 1 tablet by mouth every 6 (six) hours as needed for severe pain. (Patient not taking: Reported on 03/09/2022) 15 tablet 0   Current Facility-Administered Medications  Medication Dose Route Frequency Provider Last Rate Last Admin   medroxyPROGESTERone (DEPO-PROVERA) injection 150 mg  150 mg Intramuscular Q90 days Anyanwu, Ugonna A, MD   150 mg at 12/13/21 1135    Allergies: is allergic to hydrocodone, milk-related compounds, mushroom extract complex, penicillins, cheese, latex, and sulfa antibiotics.  Physical Exam:  BP 128/85   Pulse (!) 106  Wt (!) 324 lb (147 kg)   LMP 02/09/2022 (Approximate)   Breastfeeding No   BMI 53.92 kg/m  Body mass index is 53.92 kg/m. General appearance: Well nourished, well developed female in no acute distress.  Abdomen: ?[id number] supraumbilical hernia noted with flexion, easily reducible. Diffusely non tender to palpation, non distended, and no masses Neuro/Psych:  Normal mood and affect.     Assessment: patient stable  Plan:  1. Periumbilical  hernia ED precautions given - Ambulatory referral to General Surgery  2. Encounter for surveillance of injectable contraceptive Due for depo provera today and pt would like another injection.    Return in about 3 months (around 06/07/2022) for depo provera.  No future appointments.  Durene Romans MD Attending Center for [employer])

## 2022-03-09 NOTE — Progress Notes (Signed)
RGYN patient presents for evaluation of knot on belly that is painful to touch. . Denies any pelvic pain or abdominal pain   Contraception: Depo   Date last pap: 08/11/21. Last Depo-Provera: 12/13/21. Side Effects if any: None . Serum HCG indicated? N/A. Depo-Provera 150 mg IM given by: Toya L CMA . Next appointment due 05/25/22-06/08/22.

## 2022-03-27 ENCOUNTER — Encounter: Payer: Self-pay | Admitting: Family Medicine

## 2022-04-10 DIAGNOSIS — K429 Umbilical hernia without obstruction or gangrene: Secondary | ICD-10-CM

## 2022-04-10 HISTORY — DX: Umbilical hernia without obstruction or gangrene: K42.9

## 2022-04-19 ENCOUNTER — Telehealth: Payer: Self-pay | Admitting: Family Medicine

## 2022-04-19 MED ORDER — GABAPENTIN 100 MG PO CAPS: 200.0000 mg | ORAL_CAPSULE | Freq: Every day | ORAL | 3 refills | Status: DC

## 2022-04-19 NOTE — Telephone Encounter (Addendum)
Pt last seen 01/19/22 by Amy Lomax,NP.   Called pt back to get more info. Confirmed she is taking gabapentin 100mg  po qhs. However she took total 200mg  po qhs last night due to severe headache she was having (she was first on topamax but felt this worsened headaches so she was changed to gabapentin). States headaches started worsening about two weeks ago. Typically they are intermittent but now constant. She has tried/failed: ibuprofen, Advil, excedrin, tylenol (all ineffective). Updated med list, pharmacy and allergy list. Aware I will send to AL,NP for review and call back.   She has not scheduled 72 hr EEG w/ [facility]. [facility] is telling her they need info from PCP. She is unsure exactly what. She needs to call them back to get update on this.  I called them at [phone]) [phone]. LVM for them to call back and provide update on pt.   [facility] called back at 11:25am. I took call from phone staff. Primary insurance/[employer] is requiring PCP referral prior to getting her scheduled. Last spoke with pt 04/03/22. Asked they f/u with her 04/14/22. They were not able to get in touch with her on 04/14/22. I did relay pt stated today she would call them back. She verbalized understanding.

## 2022-04-19 NOTE — Telephone Encounter (Signed)
Pt stated she is having bad headaches, stated gabapentin (NEURONTIN) 100 MG capsule isn't helping with her headaches. Stated her headaches was so bad last night she ended up taking two tablets of gabapentin. Pt requesting a call back from nurse to discuss.

## 2022-04-26 NOTE — Progress Notes (Signed)
COVID Vaccine Completed: yes  Date of COVID positive in last 90 days:  PCP -  Cardiologist -   Chest x-ray - 05/26/21 Epic EKG - 05/26/21 Epic Stress Test -  ECHO -  Cardiac Cath -  Pacemaker/ICD device last checked: Spinal Cord Stimulator:  Bowel Prep -   Sleep Study -  CPAP -   Fasting Blood Sugar -  Checks Blood Sugar _____ times a day  Last dose of GLP1 agonist-  N/A GLP1 instructions:  N/A   Last dose of SGLT-2 inhibitors-  N/A SGLT-2 instructions: N/A   Blood Thinner Instructions:  Time Aspirin Instructions: Last Dose:  Activity level:  Can go up a flight of stairs and perform activities of daily living without stopping and without symptoms of chest pain or shortness of breath.  Able to exercise without symptoms  Unable to go up a flight of stairs without symptoms of     Anesthesia review: seizures, anemia , asthma   Patient denies shortness of breath, fever, cough and chest pain at PAT appointment  Patient verbalized understanding of instructions that were given to them at the PAT appointment. Patient was also instructed that they will need to review over the PAT instructions again at home before surgery.

## 2022-04-26 NOTE — Patient Instructions (Signed)
SURGICAL WAITING ROOM VISITATION  Patients having surgery or a procedure may have no more than 2 support people in the waiting area - these visitors may rotate.    Children under the age of 47 must have an adult with them who is not the patient.  Due to an increase in RSV and influenza rates and associated hospitalizations, children ages 47 and under may not visit patients in [facility] hospitals.  If the patient needs to stay at the hospital during part of their recovery, the visitor guidelines for inpatient rooms apply. Pre-op nurse will coordinate an appropriate time for 1 support person to accompany patient in pre-op.  This support person may not rotate.    Please refer to the [facility] website for the visitor guidelines for Inpatients (after your surgery is over and you are in a regular room).    Your procedure is scheduled on: 05/12/22   Report to [facility] Main Entrance    Report to admitting at 5:15 AM   Call this number if you have problems the morning of surgery [phone]   Do not eat food :After Midnight.   After Midnight you may have the following liquids until 4:30 AM DAY OF SURGERY  Water Non-Citrus Juices (without pulp, NO RED-Apple, White grape, White cranberry) Black Coffee (NO MILK/CREAM OR CREAMERS, sugar ok)  Clear Tea (NO MILK/CREAM OR CREAMERS, sugar ok) regular and decaf                             Plain Jell-O (NO RED)                                           Fruit ices (not with fruit pulp, NO RED)                                     Popsicles (NO RED)                                                               Sports drinks like Gatorade (NO RED)              Drink 2 Ensure/G2 drinks AT 10:00 PM the night before surgery.        The day of surgery:  Drink ONE (1) Pre-Surgery Clear Ensure or G2 at AM the morning of surgery. Drink in one sitting. Do not sip.  This drink was given to you during your hospital  pre-op appointment  visit. Nothing else to drink after completing the  Pre-Surgery Clear Ensure or G2.          If you have questions, please contact your surgeon's office.   FOLLOW BOWEL PREP AND ANY ADDITIONAL PRE OP INSTRUCTIONS YOU RECEIVED FROM YOUR SURGEON'S OFFICE!!!     Oral Hygiene is also important to reduce your risk of infection.  Remember - BRUSH YOUR TEETH THE MORNING OF SURGERY WITH YOUR REGULAR TOOTHPASTE  DENTURES WILL BE REMOVED PRIOR TO SURGERY PLEASE DO NOT APPLY "Poly grip" OR ADHESIVES!!!   Do NOT smoke after Midnight   Take these medicines the morning of surgery with A SIP OF WATER: Tylenol, Zyrtec, Lamotrigine, Keppra, Pantoprazole, Promethazine, Topiramate   DO NOT TAKE ANY ORAL DIABETIC MEDICATIONS DAY OF YOUR SURGERY  Bring CPAP mask and tubing day of surgery.                              You may not have any metal on your body including hair pins, jewelry, and body piercing             Do not wear make-up, lotions, powders, perfumes, or deodorant  Do not wear nail polish including gel and S&S, artificial/acrylic nails, or any other type of covering on natural nails including finger and toenails. If you have artificial nails, gel coating, etc. that needs to be removed by a nail salon please have this removed prior to surgery or surgery may need to be canceled/ delayed if the surgeon/ anesthesia feels like they are unable to be safely monitored.   Do not shave  48 hours prior to surgery.    Do not bring valuables to the hospital. [facility] IS NOT             RESPONSIBLE   FOR VALUABLES.   Contacts, glasses, dentures or bridgework may not be worn into surgery.  DO NOT BRING YOUR HOME MEDICATIONS TO THE HOSPITAL. PHARMACY WILL DISPENSE MEDICATIONS LISTED ON YOUR MEDICATION LIST TO YOU DURING YOUR ADMISSION IN THE HOSPITAL!    Patients discharged on the day of surgery will not be allowed to drive home.  Someone NEEDS to stay with you for  the first 24 hours after anesthesia.   Special Instructions: Bring a copy of your healthcare power of attorney and living will documents the day of surgery if you haven't scanned them before.              Please read over the following fact sheets you were given: IF YOU HAVE QUESTIONS ABOUT YOUR PRE-OP INSTRUCTIONS PLEASE CALL [name]    If you received a COVID test during your pre-op visit  it is requested that you wear a mask when out in public, stay away from anyone that may not be feeling well and notify your surgeon if you develop symptoms. If you test positive for Covid or have been in contact with anyone that has tested positive in the last 10 days please notify you surgeon.    [facility] - Preparing for Surgery Before surgery, you can play an important role.  Because skin is not sterile, your skin needs to be as free of germs as possible.  You can reduce the number of germs on your skin by washing with CHG (chlorahexidine gluconate) soap before surgery.  CHG is an antiseptic cleaner which kills germs and bonds with the skin to continue killing germs even after washing. Please DO NOT use if you have an allergy to CHG or antibacterial soaps.  If your skin becomes reddened/irritated stop using the CHG and inform your nurse when you arrive at Short Stay. Do not shave (including legs and underarms) for at least 48 hours prior to the first CHG shower.  You may shave your face/neck.  Please follow these instructions carefully:  1.  Shower with CHG Soap the night before surgery and the  morning of surgery.  2.  If you choose to wash your hair, wash your hair first as usual with your normal  shampoo.  3.  After you shampoo, rinse your hair and body thoroughly to remove the shampoo.                             4.  Use CHG as you would any other liquid soap.  You can apply chg directly to the skin and wash.  Gently with a scrungie or clean washcloth.  5.  Apply the CHG Soap to your body  ONLY FROM THE NECK DOWN.   Do   not use on face/ open                           Wound or open sores. Avoid contact with eyes, ears mouth and   genitals (private parts).                       Wash face,  Genitals (private parts) with your normal soap.             6.  Wash thoroughly, paying special attention to the area where your    surgery  will be performed.  7.  Thoroughly rinse your body with warm water from the neck down.  8.  DO NOT shower/wash with your normal soap after using and rinsing off the CHG Soap.                9.  Pat yourself dry with a clean towel.            10.  Wear clean pajamas.            11.  Place clean sheets on your bed the night of your first shower and do not  sleep with pets. Day of Surgery : Do not apply any lotions/deodorants the morning of surgery.  Please wear clean clothes to the hospital/surgery center.  FAILURE TO FOLLOW THESE INSTRUCTIONS MAY RESULT IN THE CANCELLATION OF YOUR SURGERY  PATIENT SIGNATURE_________________________________  NURSE SIGNATURE__________________________________  ________________________________________________________________________

## 2022-04-26 NOTE — Progress Notes (Signed)
Please place orders for PAT appointment scheduled 04/27/22.

## 2022-04-27 ENCOUNTER — Encounter (HOSPITAL_COMMUNITY)
Admission: RE | Admit: 2022-04-27 | Discharge: 2022-04-27 | Disposition: A | Discharge status: Home / Self Care | Payer: Medicaid Other | Source: Ambulatory Visit | Attending: Surgery | Admitting: Surgery

## 2022-04-27 ENCOUNTER — Other Ambulatory Visit: Payer: Self-pay

## 2022-04-27 ENCOUNTER — Encounter (HOSPITAL_COMMUNITY): Payer: Self-pay

## 2022-04-27 VITALS — BP 122/90 | HR 92 | Temp 98.2°F | Resp 18 | Ht 64.0 in | Wt 324.0 lb

## 2022-04-27 DIAGNOSIS — O99019 Anemia complicating pregnancy, unspecified trimester: Secondary | ICD-10-CM | POA: Diagnosis not present

## 2022-04-27 DIAGNOSIS — Z3A00 Weeks of gestation of pregnancy not specified: Secondary | ICD-10-CM | POA: Diagnosis not present

## 2022-04-27 DIAGNOSIS — O99353 Diseases of the nervous system complicating pregnancy, third trimester: Secondary | ICD-10-CM | POA: Insufficient documentation

## 2022-04-27 DIAGNOSIS — D649 Anemia, unspecified: Secondary | ICD-10-CM

## 2022-04-27 DIAGNOSIS — G40909 Epilepsy, unspecified, not intractable, without status epilepticus: Secondary | ICD-10-CM | POA: Diagnosis not present

## 2022-04-27 HISTORY — DX: Gastro-esophageal reflux disease without esophagitis: K21.9

## 2022-04-27 HISTORY — DX: Pneumonia, unspecified organism: J18.9

## 2022-04-27 LAB — BASIC METABOLIC PANEL
Anion gap: 9 (ref 5–15)
BUN: 9 mg/dL (ref 6–20)
CO2: 23 mmol/L (ref 22–32)
Calcium: 9.1 mg/dL (ref 8.9–10.3)
Chloride: 104 mmol/L (ref 98–111)
Creatinine, Ser: 0.75 mg/dL (ref 0.44–1.00)
GFR, Estimated: >60 mL/min (ref >60)
Glucose, Bld: 101 mg/dL — ABNORMAL HIGH (ref 70–99)
Potassium: 3.9 mmol/L (ref 3.5–5.1)
Sodium: 136 mmol/L (ref 135–145)

## 2022-04-27 LAB — CBC
HCT: 40.7 % (ref 36.0–46.0)
Hemoglobin: 13 g/dL (ref 12.0–15.0)
MCH: 27.7 pg (ref 26.0–34.0)
MCHC: 31.9 g/dL (ref 30.0–36.0)
MCV: 86.6 fL (ref 80.0–100.0)
Platelets: 384 10*3/uL (ref 150–400)
RBC: 4.7 MIL/uL (ref 3.87–5.11)
RDW: 13.1 % (ref 11.5–15.5)
WBC: 5.9 10*3/uL (ref 4.0–10.5)
nRBC: 0 % (ref 0.0–0.2)

## 2022-05-11 ENCOUNTER — Encounter (HOSPITAL_COMMUNITY): Payer: Self-pay | Admitting: Surgery

## 2022-05-11 NOTE — Anesthesia Preprocedure Evaluation (Addendum)
Anesthesia Evaluation  Patient identified by MRN, date of birth, ID band Patient awake    Reviewed: Allergy & Precautions, NPO status , Patient's Chart, lab work & pertinent test results  History of Anesthesia Complications Negative for: history of anesthetic complications  Airway Mallampati: III  TM Distance: >3 FB     Dental  (+) Dental Advisory Given   Pulmonary neg shortness of breath, asthma (last needed inhaler 2 months ago) , neg sleep apnea, neg COPD, neg recent URI   Pulmonary exam normal breath sounds clear to auscultation       Cardiovascular (-) hypertension(-) angina (-) Past MI, (-) Cardiac Stents and (-) CABG negative cardio ROS (-) dysrhythmias  Rhythm:Regular Rate:Normal     Neuro/Psych  Headaches, Seizures - (on Lamictal and Keppra (both taken today); last seizure in January, meds adjusted),  PSYCHIATRIC DISORDERS (ADHD, conversion disorder) Anxiety Depression       GI/Hepatic Neg liver ROS,GERD  Medicated,,  Endo/Other  diabetes  Morbid obesity  Renal/GU negative Renal ROS     Musculoskeletal   Abdominal  (+) + obese  Peds  Hematology  (+) Blood dyscrasia, anemia   Anesthesia Other Findings   Reproductive/Obstetrics                             Anesthesia Physical Anesthesia Plan  ASA: 3  Anesthesia Plan: General   Post-op Pain Management: Tylenol PO (pre-op)*   Induction: Intravenous  PONV Risk Score and Plan: 3 and Ondansetron, Dexamethasone and Treatment may vary due to age or medical condition  Airway Management Planned: Oral ETT  Additional Equipment:   Intra-op Plan:   Post-operative Plan: Extubation in OR  Informed Consent: I have reviewed the patients History and Physical, chart, labs and discussed the procedure including the risks, benefits and alternatives for the proposed anesthesia with the patient or authorized representative who has indicated  his/her understanding and acceptance.     Dental advisory given  Plan Discussed with: CRNA and Anesthesiologist  Anesthesia Plan Comments: (Risks of general anesthesia discussed including, but not limited to, sore throat, hoarse voice, chipped/damaged teeth, injury to vocal cords, nausea and vomiting, allergic reactions, lung infection, heart attack, stroke, and death. All questions answered. )       Anesthesia Quick Evaluation

## 2022-05-11 NOTE — H&P (Signed)
REFERRING PHYSICIAN: Woodbridge Bing, MD  PROVIDER: Katha Cabal, MD  MRN: [id number] DOB: Apr 20, 1995  Subjective  Chief Complaint: New Consultation (Periumbilical hernia/)   History of Present Illness: Latoya Stark is a 27 y.o. female who is seen today as an office consultation at the request of Dr. Vergie Living for evaluation of New Consultation (Periumbilical hernia/) .  Ms. Date comes in with her young son and a child carrier with an umbilical hernia. She is a 27 year old African-American female who is 5 feet 5, weighs 327, BMI above 50 but has a tender supraumbilical hernia is easily palpable. She said it was most noticeable when she was pregnant but since it is still there and it is more painful at night. It is readily palpable in the supraumbilical area and could well be a mushroom-shaped preperitoneal fatty hernia. She denies any history of nausea vomiting.  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 She has had a seizure disorder for which she takes Keppra and Lamictal and has migraines with visual aura. 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 She has some problems with breathing during the allergy season. She has anxiety some GERD as long as well as her seizure disorder.  I discussed laparoscopically assisted ventral hernia repair with her including placement of mesh. I told her since she is overweight that it does carry with it a higher risk of recurrence. She is having a lot of soreness and tenderness and wants to get this repaired.  Review of Systems: See HPI as well for other ROS.  ROS  Medical History: Past Medical History: Diagnosis Date Anemia Anxiety Asthma, unspecified asthma severity, unspecified whether complicated, unspecified whether persistent  (HHS-HCC) Seizures (CMS/HHS-HCC)  Patient Active Problem List Diagnosis BMI 50.0-59.9, adult (CMS/HHS-HCC) Chronic migraine without aura without status migrainosus, not intractable Conversion disorder History of seizure Periumbilical hernia Todd's paralysis (CMS/HHS-HCC) Postpartum depression, postpartum condition Vulvar abscess  Past Surgical History: Procedure Laterality Date Dilation and evacuation 08/09/2013 ADENOIDECTOMY Orthopedic surgery Left Hand TONSILLECTOMY   Allergies Allergen Reactions Hydrocodone Hives Milk Containing Products (Dairy) Dermatitis Breaks face out *can have 2% milk* Mushroom Anaphylaxis Penicillins Anaphylaxis Has patient had a PCN reaction causing immediate rash, facial/tongue/throat swelling, SOB or lightheadedness with hypotension: Yes Has patient had a PCN reaction causing severe rash involving mucus membranes or skin necrosis: No Has patient had a PCN reaction that required hospitalization No Has patient had a PCN reaction occurring within the last 10 years: Yes If all of the above answers are "NO", then may proceed with Cephalosporin use. Latex Itching and Swelling Sulfa (Sulfonamide Antibiotics) Swelling  Current Outpatient Medications on File Prior to Visit Medication Sig Dispense Refill lamoTRIgine (LAMICTAL) 200 MG tablet Take by mouth levETIRAcetam (KEPPRA) 750 MG tablet Take 1,500 mg by mouth 2 (two) times daily medroxyPROGESTERone (DEPO-PROVERA) 150 mg/mL injection Inject into the muscle PNV Comb12/Iron Cb/FA1/DSS/DHA (PRENATAL 12-IRON-FA1-DSS-DHA ORAL) Take by mouth acetaminophen (TYLENOL) 325 MG tablet Take by mouth (Patient not taking: Reported on 04/19/2022) gabapentin (NEURONTIN) 100 MG capsule Take 100 mg by mouth 3 (three) times daily metoclopramide (REGLAN) 10 MG tablet Take by mouth (Patient not taking: Reported on 04/19/2022)  No current facility-administered medications on file prior to visit.  History reviewed. No  pertinent family history.  Social History  Tobacco Use Smoking  Status Never Smokeless Tobacco Never   Social History  Socioeconomic History Marital status: Single Tobacco Use Smoking status: Never Smokeless tobacco: Never Substance and Sexual Activity Alcohol use: Not Currently Drug use: Never  Objective:  Vitals: BP: 132/86 Pulse: 76 Temp: 36.6 C (97.8 F) SpO2: 98% Weight: (!) 148.5 kg (327 lb 6.4 oz) Height: 165.1 cm (5\' 5" ) PainSc: 0-No pain  Body mass index is 54.48 kg/m.  Physical Exam General: Obese African-American female pleasant in no acute distress HEENT : Unremarkable Chest: Clear to auscultation without wheezes Heart: Sinus rhythm Breast: Not examined Abdomen: Tender area in the supraumbilical region with a probably a 3 cm fullness consistent with an incarcerated preperitoneal fat supraumbilical hernia GU not examined Rectal not performed Extremities full range of motion Neuro alert and oriented x 3. Motor and sensory function are grossly intact. She is currently having a migraine headache and has contacted her neurologist.  Labs, Imaging and Diagnostic Testing: None to review  Assessment and Plan: Diagnoses and all orders for this visit:  Umbilical hernia without obstruction and without gangrene    Will set to set up for outpatient under general anesthesia laparoscopically assisted repair of umbilical hernia or supraumbilical hernia with mesh to be done as an outpatient.  No follow-ups on file.  Cletus Mehlhoff Charna Busman, MD

## 2022-05-12 ENCOUNTER — Other Ambulatory Visit: Payer: Self-pay

## 2022-05-12 ENCOUNTER — Encounter (HOSPITAL_COMMUNITY): Payer: Self-pay | Admitting: Surgery

## 2022-05-12 ENCOUNTER — Encounter (HOSPITAL_COMMUNITY)
Admission: RE | Disposition: A | Discharge status: Home / Self Care | Payer: Self-pay | Source: Ambulatory Visit | Attending: Surgery

## 2022-05-12 ENCOUNTER — Ambulatory Visit (HOSPITAL_COMMUNITY): Payer: Medicaid Other | Admitting: Anesthesiology

## 2022-05-12 ENCOUNTER — Ambulatory Visit (HOSPITAL_COMMUNITY)
Admission: RE | Admit: 2022-05-12 | Discharge: 2022-05-12 | Disposition: A | Discharge status: Home / Self Care | Payer: Medicaid Other | Source: Ambulatory Visit | Attending: Surgery | Admitting: Surgery

## 2022-05-12 ENCOUNTER — Ambulatory Visit (HOSPITAL_BASED_OUTPATIENT_CLINIC_OR_DEPARTMENT_OTHER): Payer: Medicaid Other | Admitting: Anesthesiology

## 2022-05-12 DIAGNOSIS — Z6841 Body Mass Index (BMI) 40.0 and over, adult: Secondary | ICD-10-CM | POA: Insufficient documentation

## 2022-05-12 DIAGNOSIS — F909 Attention-deficit hyperactivity disorder, unspecified type: Secondary | ICD-10-CM | POA: Diagnosis not present

## 2022-05-12 DIAGNOSIS — E119 Type 2 diabetes mellitus without complications: Secondary | ICD-10-CM | POA: Insufficient documentation

## 2022-05-12 DIAGNOSIS — J45909 Unspecified asthma, uncomplicated: Secondary | ICD-10-CM | POA: Diagnosis not present

## 2022-05-12 DIAGNOSIS — Z79899 Other long term (current) drug therapy: Secondary | ICD-10-CM | POA: Insufficient documentation

## 2022-05-12 DIAGNOSIS — K420 Umbilical hernia with obstruction, without gangrene: Secondary | ICD-10-CM | POA: Diagnosis present

## 2022-05-12 DIAGNOSIS — F418 Other specified anxiety disorders: Secondary | ICD-10-CM

## 2022-05-12 DIAGNOSIS — F32A Depression, unspecified: Secondary | ICD-10-CM | POA: Diagnosis not present

## 2022-05-12 DIAGNOSIS — Z793 Long term (current) use of hormonal contraceptives: Secondary | ICD-10-CM | POA: Insufficient documentation

## 2022-05-12 DIAGNOSIS — K436 Other and unspecified ventral hernia with obstruction, without gangrene: Secondary | ICD-10-CM | POA: Diagnosis not present

## 2022-05-12 DIAGNOSIS — K219 Gastro-esophageal reflux disease without esophagitis: Secondary | ICD-10-CM | POA: Diagnosis not present

## 2022-05-12 DIAGNOSIS — G40909 Epilepsy, unspecified, not intractable, without status epilepticus: Secondary | ICD-10-CM | POA: Insufficient documentation

## 2022-05-12 DIAGNOSIS — F419 Anxiety disorder, unspecified: Secondary | ICD-10-CM | POA: Diagnosis not present

## 2022-05-12 DIAGNOSIS — D649 Anemia, unspecified: Secondary | ICD-10-CM

## 2022-05-12 HISTORY — PX: UMBILICAL HERNIA REPAIR: SHX196

## 2022-05-12 LAB — POCT PREGNANCY, URINE: Preg Test, Ur: NEGATIVE

## 2022-05-12 SURGERY — REPAIR, HERNIA, UMBILICAL, LAPAROSCOPIC
Anesthesia: General

## 2022-05-12 MED ORDER — PHENYLEPHRINE HCL-NACL 20-0.9 MG/250ML-% IV SOLN
INTRAVENOUS | Status: DC | PRN
  Administered 2022-05-12: 40 ug/min via INTRAVENOUS

## 2022-05-12 MED ORDER — FENTANYL CITRATE (PF) 250 MCG/5ML IJ SOLN
INTRAMUSCULAR | Status: AC
  Filled 2022-05-12: qty 5

## 2022-05-12 MED ORDER — DEXMEDETOMIDINE HCL IN NACL 80 MCG/20ML IV SOLN
INTRAVENOUS | Status: AC
  Filled 2022-05-12: qty 20

## 2022-05-12 MED ORDER — ACETAMINOPHEN 500 MG PO TABS: 1000.0000 mg | ORAL_TABLET | Freq: Once | ORAL | Status: DC

## 2022-05-12 MED ORDER — PROPOFOL 10 MG/ML IV BOLUS
INTRAVENOUS | Status: AC
  Filled 2022-05-12: qty 20

## 2022-05-12 MED ORDER — CHLORHEXIDINE GLUCONATE 0.12 % MT SOLN
15.0000 mL | Freq: Once | OROMUCOSAL | Status: AC
  Administered 2022-05-12: 15 mL via OROMUCOSAL

## 2022-05-12 MED ORDER — CHLORHEXIDINE GLUCONATE CLOTH 2 % EX PADS: 6.0000 | MEDICATED_PAD | Freq: Once | CUTANEOUS | Status: DC

## 2022-05-12 MED ORDER — OXYCODONE HCL 5 MG PO TABS
5.0000 mg | ORAL_TABLET | Freq: Four times a day (QID) | ORAL | 0 refills | Status: DC | PRN
Start: 2022-05-12 — End: 2022-10-30

## 2022-05-12 MED ORDER — CHLORHEXIDINE GLUCONATE CLOTH 2 % EX PADS
6.0000 | MEDICATED_PAD | Freq: Once | CUTANEOUS | Status: AC
  Administered 2022-05-12: 6 via TOPICAL

## 2022-05-12 MED ORDER — FENTANYL CITRATE (PF) 100 MCG/2ML IJ SOLN
INTRAMUSCULAR | Status: DC | PRN
  Administered 2022-05-12 (×2): 50 ug via INTRAVENOUS
  Administered 2022-05-12: 100 ug via INTRAVENOUS
  Administered 2022-05-12: 50 ug via INTRAVENOUS

## 2022-05-12 MED ORDER — SUGAMMADEX SODIUM 200 MG/2ML IV SOLN
INTRAVENOUS | Status: DC | PRN
  Administered 2022-05-12: 400 mg via INTRAVENOUS

## 2022-05-12 MED ORDER — DEXAMETHASONE SODIUM PHOSPHATE 10 MG/ML IJ SOLN
INTRAMUSCULAR | Status: AC
  Filled 2022-05-12: qty 2

## 2022-05-12 MED ORDER — BUPIVACAINE LIPOSOME 1.3 % IJ SUSP
INTRAMUSCULAR | Status: DC | PRN
  Administered 2022-05-12: 20 mL

## 2022-05-12 MED ORDER — SCOPOLAMINE 1 MG/3DAYS TD PT72
1.0000 | MEDICATED_PATCH | TRANSDERMAL | Status: DC
  Administered 2022-05-12: 1.5 mg via TRANSDERMAL
  Filled 2022-05-12: qty 1

## 2022-05-12 MED ORDER — MIDAZOLAM HCL 5 MG/5ML IJ SOLN
INTRAMUSCULAR | Status: DC | PRN
  Administered 2022-05-12: 2 mg via INTRAVENOUS

## 2022-05-12 MED ORDER — OXYCODONE HCL 5 MG/5ML PO SOLN: 5.0000 mg | Freq: Once | ORAL | Status: AC | PRN

## 2022-05-12 MED ORDER — PROPOFOL 10 MG/ML IV BOLUS
INTRAVENOUS | Status: DC | PRN
  Administered 2022-05-12: 200 mg via INTRAVENOUS

## 2022-05-12 MED ORDER — BUPIVACAINE LIPOSOME 1.3 % IJ SUSP
INTRAMUSCULAR | Status: AC
  Filled 2022-05-12: qty 20

## 2022-05-12 MED ORDER — MIDAZOLAM HCL 2 MG/2ML IJ SOLN
INTRAMUSCULAR | Status: AC
  Filled 2022-05-12: qty 2

## 2022-05-12 MED ORDER — CIPROFLOXACIN IN D5W 400 MG/200ML IV SOLN
400.0000 mg | INTRAVENOUS | Status: AC
  Administered 2022-05-12: 400 mg via INTRAVENOUS
  Filled 2022-05-12: qty 200

## 2022-05-12 MED ORDER — DEXAMETHASONE SODIUM PHOSPHATE 10 MG/ML IJ SOLN
INTRAMUSCULAR | Status: DC | PRN
  Administered 2022-05-12: 10 mg via INTRAVENOUS

## 2022-05-12 MED ORDER — LACTATED RINGERS IV SOLN
INTRAVENOUS | Status: DC
  Administered 2022-05-12: 1000 mL via INTRAVENOUS

## 2022-05-12 MED ORDER — BUPIVACAINE HCL (PF) 0.25 % IJ SOLN
INTRAMUSCULAR | Status: AC
  Filled 2022-05-12: qty 30

## 2022-05-12 MED ORDER — FENTANYL CITRATE PF 50 MCG/ML IJ SOSY
25.0000 ug | PREFILLED_SYRINGE | INTRAMUSCULAR | Status: DC | PRN
  Administered 2022-05-12: 50 ug via INTRAVENOUS

## 2022-05-12 MED ORDER — ONDANSETRON HCL 4 MG/2ML IJ SOLN
INTRAMUSCULAR | Status: DC | PRN
  Administered 2022-05-12: 4 mg via INTRAVENOUS

## 2022-05-12 MED ORDER — ONDANSETRON HCL 4 MG/2ML IJ SOLN
INTRAMUSCULAR | Status: AC
  Filled 2022-05-12: qty 4

## 2022-05-12 MED ORDER — LIDOCAINE HCL (PF) 2 % IJ SOLN
INTRAMUSCULAR | Status: AC
  Filled 2022-05-12: qty 10

## 2022-05-12 MED ORDER — FENTANYL CITRATE PF 50 MCG/ML IJ SOSY
PREFILLED_SYRINGE | INTRAMUSCULAR | Status: AC
  Administered 2022-05-12: 50 ug via INTRAVENOUS
  Filled 2022-05-12: qty 2

## 2022-05-12 MED ORDER — LIDOCAINE 2% (20 MG/ML) 5 ML SYRINGE
INTRAMUSCULAR | Status: DC | PRN
  Administered 2022-05-12: 100 mg via INTRAVENOUS

## 2022-05-12 MED ORDER — SUCCINYLCHOLINE CHLORIDE 200 MG/10ML IV SOSY
PREFILLED_SYRINGE | INTRAVENOUS | Status: DC | PRN
  Administered 2022-05-12: 160 mg via INTRAVENOUS

## 2022-05-12 MED ORDER — OXYCODONE HCL 5 MG PO TABS
ORAL_TABLET | ORAL | Status: AC
  Filled 2022-05-12: qty 1

## 2022-05-12 MED ORDER — ACETAMINOPHEN 500 MG PO TABS
1000.0000 mg | ORAL_TABLET | ORAL | Status: AC
  Administered 2022-05-12: 1000 mg via ORAL
  Filled 2022-05-12: qty 2

## 2022-05-12 MED ORDER — ORAL CARE MOUTH RINSE: 15.0000 mL | Freq: Once | OROMUCOSAL | Status: AC

## 2022-05-12 MED ORDER — BUPIVACAINE HCL 0.25 % IJ SOLN
INTRAMUSCULAR | Status: DC | PRN
  Administered 2022-05-12: 30 mL

## 2022-05-12 MED ORDER — OXYCODONE HCL 5 MG PO TABS
5.0000 mg | ORAL_TABLET | Freq: Once | ORAL | Status: AC | PRN
  Administered 2022-05-12: 5 mg via ORAL

## 2022-05-12 MED ORDER — SUCCINYLCHOLINE CHLORIDE 200 MG/10ML IV SOSY
PREFILLED_SYRINGE | INTRAVENOUS | Status: AC
  Filled 2022-05-12: qty 10

## 2022-05-12 MED ORDER — ROCURONIUM BROMIDE 10 MG/ML (PF) SYRINGE
PREFILLED_SYRINGE | INTRAVENOUS | Status: DC | PRN
  Administered 2022-05-12: 20 mg via INTRAVENOUS
  Administered 2022-05-12: 80 mg via INTRAVENOUS

## 2022-05-12 MED ORDER — FENTANYL CITRATE PF 50 MCG/ML IJ SOSY
PREFILLED_SYRINGE | INTRAMUSCULAR | Status: AC
  Administered 2022-05-12: 50 ug via INTRAVENOUS
  Filled 2022-05-12: qty 1

## 2022-05-12 MED ORDER — PROMETHAZINE HCL 25 MG/ML IJ SOLN: 6.2500 mg | INTRAMUSCULAR | Status: DC | PRN

## 2022-05-12 MED ORDER — DEXMEDETOMIDINE HCL IN NACL 80 MCG/20ML IV SOLN
INTRAVENOUS | Status: DC | PRN
  Administered 2022-05-12 (×4): 4 ug via INTRAVENOUS

## 2022-05-12 SURGICAL SUPPLY — 48 items
ADH SKN CLS APL DERMABOND .7 (GAUZE/BANDAGES/DRESSINGS) ×1
BAG COUNTER SPONGE SURGICOUNT (BAG) IMPLANT
BAG SPNG CNTER NS LX DISP (BAG)
BINDER ABDOMINAL 12 ML 46-62 (SOFTGOODS) IMPLANT
COVER SURGICAL LIGHT HANDLE (MISCELLANEOUS) ×2 IMPLANT
DERMABOND ADVANCED .7 DNX12 (GAUZE/BANDAGES/DRESSINGS) IMPLANT
DEVICE SECURE STRAP 25 ABSORB (INSTRUMENTS) IMPLANT
DEVICE TROCAR PUNCTURE CLOSURE (ENDOMECHANICALS) IMPLANT
DISSECTOR BLUNT TIP ENDO 5MM (MISCELLANEOUS) IMPLANT
DRAIN CHANNEL 19F RND (DRAIN) IMPLANT
DRSG TEGADERM 4X4.75 (GAUZE/BANDAGES/DRESSINGS) IMPLANT
ELECT REM PT RETURN 15FT ADLT (MISCELLANEOUS) ×2 IMPLANT
EVACUATOR SILICONE 100CC (DRAIN) IMPLANT
GAUZE SPONGE 4X4 12PLY STRL (GAUZE/BANDAGES/DRESSINGS) IMPLANT
GLOVE SURG LX STRL 8.0 MICRO (GLOVE) ×2 IMPLANT
GOWN SPEC L4 XLG W/TWL (GOWN DISPOSABLE) ×2 IMPLANT
GOWN STRL REUS W/ TWL XL LVL3 (GOWN DISPOSABLE) ×4 IMPLANT
GOWN STRL REUS W/TWL XL LVL3 (GOWN DISPOSABLE) ×2
IRRIG SUCT STRYKERFLOW 2 WTIP (MISCELLANEOUS)
IRRIGATION SUCT STRKRFLW 2 WTP (MISCELLANEOUS) IMPLANT
KIT BASIN OR (CUSTOM PROCEDURE TRAY) ×2 IMPLANT
KIT TURNOVER KIT A (KITS) IMPLANT
MARKER SKIN DUAL TIP RULER LAB (MISCELLANEOUS) IMPLANT
MESH VENTRALEX ST 2.5 CRC MED (Mesh General) IMPLANT
NDL SPNL 22GX3.5 QUINCKE BK (NEEDLE) IMPLANT
NEEDLE SPNL 22GX3.5 QUINCKE BK (NEEDLE) IMPLANT
PAD POSITIONING PINK XL (MISCELLANEOUS) IMPLANT
PENCIL SMOKE EVACUATOR (MISCELLANEOUS) IMPLANT
PROTECTOR NERVE ULNAR (MISCELLANEOUS) IMPLANT
SCISSORS LAP 5X45 EPIX DISP (ENDOMECHANICALS) ×2 IMPLANT
SET TUBE SMOKE EVAC HIGH FLOW (TUBING) ×2 IMPLANT
SLEEVE Z-THREAD 5X100MM (TROCAR) ×2 IMPLANT
SPIKE FLUID TRANSFER (MISCELLANEOUS) ×2 IMPLANT
STAPLER VISISTAT 35W (STAPLE) IMPLANT
STRIP CLOSURE SKIN 1/2X4 (GAUZE/BANDAGES/DRESSINGS) IMPLANT
SUT MNCRL AB 4-0 PS2 18 (SUTURE) IMPLANT
SUT NOVA 0 T19/GS 22DT (SUTURE) IMPLANT
SUT NOVA NAB DX-16 0-1 5-0 T12 (SUTURE) ×2 IMPLANT
SUT NOVA NAB GS-21 0 18 T12 DT (SUTURE) IMPLANT
SUT VIC AB 4-0 SH 18 (SUTURE) ×2 IMPLANT
TACKER 5MM HERNIA 3.5CML NAB (ENDOMECHANICALS) IMPLANT
TOWEL OR 17X26 10 PK STRL BLUE (TOWEL DISPOSABLE) ×2 IMPLANT
TOWEL OR NON WOVEN STRL DISP B (DISPOSABLE) ×2 IMPLANT
TRAY FOLEY MTR SLVR 16FR STAT (SET/KITS/TRAYS/PACK) IMPLANT
TRAY LAPAROSCOPIC (CUSTOM PROCEDURE TRAY) ×2 IMPLANT
TROCAR 11X100 Z THREAD (TROCAR) IMPLANT
TROCAR XCEL NON-BLD 5MMX100MML (ENDOMECHANICALS) IMPLANT
TROCAR Z-THREAD OPTICAL 5X100M (TROCAR) ×2 IMPLANT

## 2022-05-12 NOTE — Transfer of Care (Signed)
Immediate Anesthesia Transfer of Care Note  Patient: Latoya Stark  Procedure(s) Performed: Procedure(s): LAPAROSCOPIC ASSISTED SUPRA UMBILICAL HERNIA REPAIR (N/A)  Patient Location: PACU  Anesthesia Type:General  Level of Consciousness:  sedated, patient cooperative and responds to stimulation  Airway & Oxygen Therapy:Patient Spontanous Breathing and Patient connected to face mask oxgen  Post-op Assessment:  Report given to PACU RN and Post -op Vital signs reviewed and stable  Post vital signs:  Reviewed and stable  Last Vitals:  Vitals:   05/12/22 0600 05/12/22 0918  BP: (!) 135/96 (!) 139/99  Pulse: 88 92  Resp: 18 (!) 30  Temp: 36.7 C   SpO2: 96% 97%    Complications: No apparent anesthesia complications

## 2022-05-12 NOTE — Anesthesia Postprocedure Evaluation (Signed)
Anesthesia Post Note  Patient: Latoya Stark  Procedure(s) Performed: LAPAROSCOPIC ASSISTED SUPRA UMBILICAL HERNIA REPAIR     Patient location during evaluation: PACU Anesthesia Type: General Level of consciousness: awake Pain management: pain level controlled Vital Signs Assessment: post-procedure vital signs reviewed and stable Respiratory status: spontaneous breathing, nonlabored ventilation and respiratory function stable Cardiovascular status: blood pressure returned to baseline and stable Postop Assessment: no apparent nausea or vomiting Anesthetic complications: no   No notable events documented.  Last Vitals:  Vitals:   05/12/22 1000 05/12/22 1015  BP: (!) 129/99 (!) 135/90  Pulse: 82 73  Resp: (!) 22 17  Temp:    SpO2: 90% 94%    Last Pain:  Vitals:   05/12/22 1000  TempSrc:   PainSc: Asleep                 [name]

## 2022-05-12 NOTE — Op Note (Signed)
ERMON SARVIS  Aug 27, 1995   05/12/2022    PCP:  Pcp, No   Surgeon: Wenda Low, MD, FACS  Asst:  none  Anes:  general  Preop Dx: Incarcerated supraumbilical hernia Postop Dx: Same containing preperitoneal fat  Procedure: Lap assisted Ventralex patch repair (2.5") Location Surgery: [facility] 1 Complications: None noted  EBL:   minimal cc  Drains: none  Description of Procedure:  The patient was taken to OR 1 .  After anesthesia was administered and the patient was prepped  with chloroprep  and a timeout was performed.  Access to the abdomen was achieved with a 5 mm Optiview through the left upper quadrant without difficulty.  Following insufflation the palpable supraumbilical region was seen to have a very large contained preperitoneal fat incarcerated hernia which I partially reduced.  Not able to completely reduce it with the laparoscope after placing a second port I wanted made a supraumbilical transverse incision and then dissected the sac out with the incarcerated material.  Initially I ended up excising this in the sac and the preperitoneal fat in toto.  Put my finger in and could feel 4 defects.  There is a patient has an umbilical any no repairable defect.  This defect was about the size of my index finger which was about a centimeter and 1/2 to 2 cm in transverse diameter.  I got the 2.5 inch Ventralex large which was the only patch that they had and inserted it pulled it up flush with the posterior peritoneum and affixed it to the fascia with 3 of 4 Novafil sutures 0 securing this.  I then went laparoscopically and secured it with the secure strap absorbable tacker.  It was lying nicely completely covering the defect and the all the fat in the mass had been removed and discarded.  I then placed 2 sutures to approximate the fascia anteriorly.  I then surveyed the abdomen the rest abdominal contents appeared unremarkable and I injected Exparel around the repair and along the tube  laparoscopic port sites.  The abdomen was deflated and the wounds were closed in layers with 4-0 Vicryl and then 4-0 PDS with Dermabond.  An abdominal binder was placed and the patient was taken to the recovery room.  The patient tolerated the procedure well and was taken to the PACU in stable condition.     Matt B. Daphine Deutscher, MD, [facility], [place] [phone]

## 2022-05-12 NOTE — Anesthesia Procedure Notes (Signed)
Procedure Name: Intubation Date/Time: 05/12/2022 7:40 AM  Performed by: Theodosia Quay, CRNAPre-anesthesia Checklist: Patient identified, Emergency Drugs available, Suction available, Patient being monitored and Timeout performed Patient Re-evaluated:Patient Re-evaluated prior to induction Oxygen Delivery Method: Circle system utilized Preoxygenation: Pre-oxygenation with 100% oxygen Induction Type: IV induction Ventilation: Mask ventilation without difficulty Laryngoscope Size: Mac and 3 Grade View: Grade I Tube type: Oral Tube size: 7.0 mm Number of attempts: 1 Airway Equipment and Method: Stylet Placement Confirmation: ETT inserted through vocal cords under direct vision, positive ETCO2, CO2 detector and breath sounds checked- equal and bilateral Secured at: 21 cm Tube secured with: Tape Dental Injury: Teeth and Oropharynx as per pre-operative assessment  Comments: ATOI

## 2022-05-12 NOTE — Interval H&P Note (Signed)
History and Physical Interval Note:  05/12/2022 7:23 AM  Latoya Stark  has presented today for surgery, with the diagnosis of SUPRA UMBILICAL HERNIA.  The various methods of treatment have been discussed with the patient and family. After consideration of risks, benefits and other options for treatment, the patient has consented to  Procedure(s): LAPAROSCOPIC ASSISTED SUPRA UMBILICAL HERNIA REPAIR (N/A) as a surgical intervention.  The patient's history has been reviewed, patient examined, no change in status, stable for surgery.  I have reviewed the patient's chart and labs.  Questions were answered to the patient's satisfaction.     Valarie Merino

## 2022-05-13 ENCOUNTER — Encounter (HOSPITAL_COMMUNITY): Payer: Self-pay | Admitting: Surgery

## 2022-06-23 ENCOUNTER — Ambulatory Visit: Payer: Medicaid Other | Admitting: Obstetrics

## 2022-06-26 ENCOUNTER — Ambulatory Visit: Payer: Medicaid Other | Admitting: Family Medicine

## 2022-07-07 ENCOUNTER — Ambulatory Visit: Payer: Medicaid Other | Admitting: Obstetrics & Gynecology

## 2022-09-26 ENCOUNTER — Encounter: Payer: Self-pay | Admitting: Gastroenterology

## 2022-09-26 ENCOUNTER — Other Ambulatory Visit: Payer: Self-pay | Admitting: *Deleted

## 2022-09-26 ENCOUNTER — Ambulatory Visit: Payer: Medicaid Other | Admitting: Gastroenterology

## 2022-09-26 VITALS — BP 111/74 | HR 79 | Temp 97.8°F | Ht 65.0 in | Wt 338.2 lb

## 2022-09-26 DIAGNOSIS — R1319 Other dysphagia: Secondary | ICD-10-CM

## 2022-09-26 DIAGNOSIS — R1310 Dysphagia, unspecified: Secondary | ICD-10-CM

## 2022-09-26 DIAGNOSIS — K5909 Other constipation: Secondary | ICD-10-CM

## 2022-09-26 DIAGNOSIS — R221 Localized swelling, mass and lump, neck: Secondary | ICD-10-CM

## 2022-09-26 MED ORDER — PEG 3350-KCL-NABCB-NACL-NASULF 236 G PO SOLR: 4000.0000 mL | Freq: Once | ORAL | 0 refills | Status: AC

## 2022-09-26 NOTE — Progress Notes (Signed)
Wyline Mood MD, MRCP(U.K) [address]  [address]  [place], [place] [address]  Main: [phone]  Fax: [phone]   Gastroenterology Consultation  Referring Provider:     No ref. provider found Primary Care Physician:  Pcp, No Primary Gastroenterologist:  Dr. Wyline Mood  Reason for Consultation: constipation         HPI:   Latoya Stark is a 27 y.o. y/o female referred for constipation.  She is a Haematologist member at our office  She has a history of constipation since atleast 2016 when she was seen by [facility]  She was treated with Linzess .  This did not help she has tried a few over-the-counter options which also have not helped.  She states she can go for months without a bowel movement this has been since she was a child.  When she does have a bowel movement it can be hard.  Denies any change in bowel habits recently denies any blood in the stool.  No family history of colon cancer in first-degree relatives.  Denies any use of opioids.  She has gained weight recently as had new onset dysphagia.  Has a history of heartburn which she attributes to reflux which has not been resolved with trial of Prilosec in the past which she has taken 40 mg twice a day.  Pills get stuck when she tries to swallow.  Denies any abdominal surgeries except hernia repair.   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   Headache    Obesity    Pneumonia    Seizures (HCC)    "when I get too hot"   Trichomonas infection    UTI (lower urinary tract infection)     Past Surgical History:  Procedure Laterality Date   ADENOIDECTOMY     DILATION AND  EVACUATION N/A 08/09/2013   Procedure: DILATATION AND EVACUATION;  Surgeon: Kathreen Cosier, MD;  Location: [facility];  Service: Gynecology;  Laterality: N/A;   EUSTACHIAN TUBE DILATION     ORTHOPEDIC SURGERY Left    left hand   TONSILLECTOMY     UMBILICAL HERNIA REPAIR N/A 05/12/2022   Procedure: LAPAROSCOPIC ASSISTED SUPRA UMBILICAL HERNIA REPAIR;  Surgeon: Luretha Murphy, MD;  Location: [facility];  Service: General;  Laterality: N/A;    Prior to Admission medications   Medication Sig Start Date End Date Taking? Authorizing Provider  acetaminophen (TYLENOL) 325 MG tablet Take 2 tablets (650 mg total) by mouth every 4 (four) hours as needed (for pain scale < 4). 06/24/21   Cresenzo-Dishmon, Scarlette Calico, CNM  albuterol (VENTOLIN HFA) 108 (90 Base) MCG/ACT inhaler Inhale 1-2 puffs into the lungs every 6 (six) hours as needed for wheezing or shortness of breath.    [provider]  cetirizine (ZYRTEC) 10 MG tablet Take 10 mg by mouth daily. 12/04/21   [provider]  cyclobenzaprine (FLEXERIL) 10 MG tablet Take 0.5-1 tablets (5-10 mg total) by mouth 3 (three) times daily as needed. Patient not taking: Reported on 02/15/2022 10/07/21   Margaretann Loveless, PA-C  docusate sodium (COLACE)  100 MG capsule Take 100 mg by mouth daily as needed for mild constipation.    [provider]  ferrous sulfate 325 (65 FE) MG EC tablet Take 325 mg by mouth daily.    [provider]  gabapentin (NEURONTIN) 100 MG capsule Take 2 capsules (200 mg total) by mouth at bedtime. 04/19/22   Lomax, Amy, NP  ibuprofen (ADVIL) 200 MG tablet Take 800 mg by mouth every 8 (eight) hours as needed for moderate pain.    [provider]  lamoTRIgine (LAMICTAL) 200 MG tablet Take 1 tablet (200 mg total) by mouth 3 (three) times daily. 01/19/22   Lomax, Amy, NP  levETIRAcetam (KEPPRA) 750 MG tablet Take 2 tablets (1,500 mg total) by mouth 2 (two) times daily. 01/19/22   Lomax, Amy, NP   medroxyPROGESTERone (DEPO-PROVERA) 150 MG/ML injection Inject 1 mL (150 mg total) into the muscle every 3 (three) months. 08/11/21   Leona Bing, MD  metoCLOPramide (REGLAN) 10 MG tablet Take 1 tablet (10 mg total) by mouth 3 (three) times daily before meals. Take 3 times a day for 30 days,Then take 2 a day for 4 days, Then take 1 a day for 4 days, Then stop Patient not taking: Reported on 04/25/2022 10/27/21   Reva Bores, MD  oxyCODONE (OXY IR/ROXICODONE) 5 MG immediate release tablet Take 1 tablet (5 mg total) by mouth every 6 (six) hours as needed for severe pain. 05/12/22   Luretha Murphy, MD  pantoprazole (PROTONIX) 40 MG tablet Take 40 mg by mouth 2 (two) times daily. 12/04/21   [provider]  Prenatal Vit-Fe Fumarate-FA (MULTIVITAMIN-PRENATAL) 27-0.8 MG TABS tablet Take 1 tablet by mouth daily at 12 noon.    [provider]  promethazine (PHENERGAN) 25 MG tablet Take 25 mg by mouth every 6 (six) hours as needed for nausea or vomiting. 12/04/21   [provider]  SYMBICORT 80-4.5 MCG/ACT inhaler Inhale 2 puffs into the lungs 2 (two) times daily as needed (shortness of breath). 12/04/21   [provider]  topiramate (TOPAMAX) 50 MG tablet Take 50 mg by mouth 2 (two) times daily.    [provider]    Family History  Problem Relation Age of Onset   Heart disease Mother    Cancer Mother        Bone cancer   Seizures Mother    Stroke Mother    Sickle cell anemia Mother    Hypertension Father    Seizures Maternal Aunt    Diabetes Maternal Grandmother    Colon cancer Maternal Grandmother    Hypertension Maternal Grandmother    Clotting disorder Maternal Grandmother    Breast cancer Maternal Grandmother    Stroke Maternal Grandmother    Diabetes Paternal Grandmother      Social History   Tobacco Use   Smoking status: Never   Smokeless tobacco: Never  Vaping Use   Vaping status: Never Used  Substance Use Topics   Alcohol use:  Yes    Comment: occ   Drug use: No    Allergies as of 09/26/2022 - Review Complete 05/12/2022  Allergen Reaction Noted   Hydrocodone Hives 05/18/2021   Milk-related compounds Dermatitis 02/24/2014   Mushroom extract complex Anaphylaxis 07/20/2014   Penicillins Anaphylaxis 02/07/2011   Cheese Other (See Comments) 02/15/2014   Latex Itching and Swelling 04/28/2014   Sulfa antibiotics Swelling 11/30/2011    Review of Systems:    All systems reviewed and negative except where noted in  HPI.   Physical Exam:  BP 111/74   Pulse 79   Temp 97.8 F (36.6 C) (Oral)   Ht 5\' 5"  (1.651 m)   Wt (!) 338 lb 4 oz (153.4 kg)   BMI 56.29 kg/m  No LMP recorded. Psych:  Alert and cooperative. Normal mood and affect. General:   Alert,  Well-developed, well-nourished, pleasant and cooperative in NAD Head:  Normocephalic and atraumatic. Eyes:  Sclera clear, no icterus.   Conjunctiva pink. Neurologic:  Alert and oriented x3;  grossly normal neurologically. Psych:  Alert and cooperative. Normal mood and affect.  Imaging Studies: No results found.  Assessment and Plan:   Latoya Stark is a 27 y.o. y/o female has been referred for chronic constipation ongoing her entire life.  Does not have a bowel movement for up to a month.  She also has difficulty swallowing and history of heartburn which she attributes to reflux.  She does have a history of asthma.  Differentials could include eosinophilic esophagitis.  In terms of her constipation if it does not respond to treatment we need to evaluate further for conditions such as Hirschsprung's disease and pelvic outlet disorders.   Plan  GERD: life styles changes- commence on Voquenza , samples provided, EGD to evaluate for dysphagia and rule EOE.  High fiber diet  Samples of Trulance and if helps she should call back for a script. Check TSH If doesn't help will need sitz marker study and anorectal manometry    I have discussed alternative options,  risks & benefits,  which include, but are not limited to, bleeding, infection, perforation,respiratory complication & drug reaction.  The patient agrees with this plan & written consent will be obtained.     Follow up in  4 weeks   Dr Wyline Mood MD,MRCP(U.K)

## 2022-09-26 NOTE — Patient Instructions (Signed)
"  [place]"

## 2022-09-27 ENCOUNTER — Telehealth: Payer: Self-pay | Admitting: Family Medicine

## 2022-09-27 ENCOUNTER — Telehealth: Payer: Self-pay | Admitting: *Deleted

## 2022-09-27 NOTE — Telephone Encounter (Signed)
Called pt and she stated that she has been taking her gabapentin, lamotrigine,and Keppra as prescribed. Pt states that she has been having a headache that has lasted almost 2 months now. Pt states that she sometimes has blurry vision and double vision with her headache. Pt stated that 1 week ago she has been having a sharp pain that will last for 30 seconds. Pt denise and nausea or vomiting with her headache. Pt states that she goes to bed with this headache and wakes up with the same headache. Pt states that she has been putting off calling us due to some deaths in her family, but pt is getting scared due to the new sharp pain she is getting in her head. Told pt that we will inform the work in doctor about this due to Amy being out and we will get back in contact with her.

## 2022-09-27 NOTE — Telephone Encounter (Signed)
Patient asked should she continue taking pantoprazole 40 BID. If yes, can she have a refill?

## 2022-09-27 NOTE — Telephone Encounter (Signed)
Pt called wanting to speak to the RN because she has been having a headache for about a month now and its not going away. Please advise.

## 2022-09-28 MED ORDER — PANTOPRAZOLE SODIUM 40 MG PO TBEC: 40.0000 mg | DELAYED_RELEASE_TABLET | Freq: Two times a day (BID) | ORAL | 1 refills | Status: DC

## 2022-09-28 NOTE — Telephone Encounter (Signed)
I called and informed pt with Dr. Lucia Gaskins recommendations. Pt verbalized she understood.

## 2022-09-28 NOTE — Telephone Encounter (Addendum)
LVM for pt to discuss Dr Lucia Gaskins recommendation( work in MD 09/27/2022   if pt calls back route to POD 1

## 2022-09-28 NOTE — Telephone Encounter (Signed)
Spoken to patient and notified Dr Johnney Killian comments. Verbalized understanding.   A refill have been sent to [facility].

## 2022-09-28 NOTE — Telephone Encounter (Signed)
If she is concerned I would ask she go to urgent care or ED and schedule a f/u with Amy

## 2022-10-02 ENCOUNTER — Ambulatory Visit: Payer: Medicaid Other | Admitting: Obstetrics and Gynecology

## 2022-10-02 ENCOUNTER — Encounter: Payer: Self-pay | Admitting: Obstetrics and Gynecology

## 2022-10-02 VITALS — BP 135/87 | HR 97 | Wt 344.0 lb

## 2022-10-02 DIAGNOSIS — M545 Low back pain, unspecified: Secondary | ICD-10-CM

## 2022-10-02 DIAGNOSIS — R634 Abnormal weight loss: Secondary | ICD-10-CM

## 2022-10-02 DIAGNOSIS — G8929 Other chronic pain: Secondary | ICD-10-CM | POA: Diagnosis not present

## 2022-10-02 DIAGNOSIS — Z3202 Encounter for pregnancy test, result negative: Secondary | ICD-10-CM | POA: Diagnosis not present

## 2022-10-02 DIAGNOSIS — Z3042 Encounter for surveillance of injectable contraceptive: Secondary | ICD-10-CM

## 2022-10-02 LAB — POCT URINE PREGNANCY: Preg Test, Ur: NEGATIVE

## 2022-10-02 MED ORDER — MEDROXYPROGESTERONE ACETATE 150 MG/ML IM SUSY
150.0000 mg | PREFILLED_SYRINGE | Freq: Once | INTRAMUSCULAR | Status: AC
Start: 2022-10-02 — End: 2022-10-02
  Administered 2022-10-02: 150 mg via INTRAMUSCULAR

## 2022-10-02 MED ORDER — MEDROXYPROGESTERONE ACETATE 150 MG/ML IM SUSP
150.0000 mg | INTRAMUSCULAR | 4 refills | Status: DC
Start: 2022-10-02 — End: 2023-01-18

## 2022-10-02 NOTE — Progress Notes (Unsigned)
RGYN here for birth control consult was on depo. Last injection was 02/24.  LMP: just ended unsure of start date  Last had unprotected intercourse: last month   Pt wants depo.

## 2022-10-02 NOTE — Progress Notes (Unsigned)
Obstetrics and Gynecology Visit Return Patient Evaluation  Appointment Date: 10/02/2022  Primary Care Provider: Aviva Stark  OBGYN Clinic: [facility] Complaint: restart depo provera. chronic low back pain. Weight loss  History of Present Illness:  Latoya Stark is a 27 y.o. with above CC  Patient on depo provera before and would like to restart. Period just ended and no sex since.   Patient with chronic back pain exacerbated by an epidural in a prior pregnancy. Patient seen by Dr. Lavada Mesi and she states that he was recommending surgery for a pinched nerve but she wanted to hold of. She's having some recurrent s/s and interested in seeing someone again; she liked Dr. Prince Rome but she lives in [place] now and would like someplace closer.   Patient seen by GI for various issues and it was brought up by them that GYN may be able to prescribe medicine for weight loss. Pt states she walks 2 miles every day and is cutting back on food intake and trying to eat healthier but with no weight loss yet.   Review of Systems: as noted in the History of Present Illness.  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 Current Outpatient Medications  Medication Sig Dispense Refill   acetaminophen (TYLENOL) 325 MG tablet Take 2 tablets (650 mg total) by mouth every 4 (four) hours as needed (for pain scale < 4).     albuterol (VENTOLIN HFA) 108 (90 Base) MCG/ACT inhaler Inhale 1-2 puffs into the lungs every 6 (six) hours as needed for wheezing or shortness of breath.     cetirizine (ZYRTEC) 10 MG tablet Take 10 mg by mouth daily.     cyclobenzaprine  (FLEXERIL) 10 MG tablet Take 0.5-1 tablets (5-10 mg total) by mouth 3 (three) times daily as needed. 30 tablet 0   docusate sodium (COLACE) 100 MG capsule Take 100 mg by mouth daily as needed for mild constipation.     ferrous sulfate 325 (65 FE) MG EC tablet Take 325 mg by mouth daily.     gabapentin (NEURONTIN) 100 MG capsule Take 2 capsules (200 mg total) by mouth at bedtime. 180 capsule 3   ibuprofen (ADVIL) 200 MG tablet Take 800 mg by mouth every 8 (eight) hours as needed for moderate pain.     lamoTRIgine (LAMICTAL) 200 MG tablet Take 1 tablet (200 mg total) by mouth 3 (three) times daily. 270 tablet 3   levETIRAcetam (KEPPRA) 750 MG tablet Take 2 tablets (1,500 mg total) by mouth 2 (two) times daily. 360 tablet 3   metoCLOPramide (REGLAN) 10 MG tablet Take 1 tablet (10 mg total) by mouth 3 (three) times daily before meals. Take 3 times a day for 30 days,Then take 2 a day for 4 days, Then take 1 a day for 4 days, Then stop 102 tablet 0   pantoprazole (PROTONIX) 40 MG tablet Take 1 tablet (40 mg total) by mouth 2 (two) times daily. 60 tablet 1   Prenatal Vit-Fe Fumarate-FA (MULTIVITAMIN-PRENATAL) 27-0.8 MG TABS tablet Take 1 tablet by mouth daily at 12 noon.     promethazine (PHENERGAN) 25 MG tablet Take 25 mg by mouth every 6 (  six) hours as needed for nausea or vomiting.     SYMBICORT 80-4.5 MCG/ACT inhaler Inhale 2 puffs into the lungs 2 (two) times daily as needed (shortness of breath).     topiramate (TOPAMAX) 50 MG tablet Take 50 mg by mouth 2 (two) times daily.      Allergies: is allergic to hydrocodone, milk-related compounds, mushroom extract complex, penicillins, cheese, latex, and sulfa antibiotics.  Physical Exam:  BP 135/87   Pulse 97   Wt (!) 344 lb (156 kg)   BMI 57.24 kg/m  Body mass index is 57.24 kg/m. General appearance: Well nourished, well developed female in no acute distress.  Neuro/Psych:  Normal mood and affect.    Labs: UPT negative  Assessment: patient  stable  Plan:  1. Chronic left-sided low back pain, unspecified whether sciatica present Referral to [facility]  2. Encounter for management and injection of depo-Provera Options d/w her and she would like resume. Injection given today  3. Weight loss I told her that likely what GI was referring to was a metformin for patients that may have PCOS, but I told her that it doesn't make you lose weight but may make it easier to use weight; pt has GDM and used metformin and caused GI issues. I d/w her importance of diet and also time restricted eating.   Return in about 3 months (around 01/01/2023) for depo provera.  Future Appointments  Date Time Provider Department Center  11/07/2022  1:45 PM Wyline Mood, MD [facility] None    Cornelia Copa MD Attending [facility])

## 2022-10-02 NOTE — Telephone Encounter (Signed)
Pt called back to  schedule f/u with Amy as instructed by Dr. Lucia Gaskins.

## 2022-10-04 NOTE — Progress Notes (Signed)
PATIENT: Latoya Stark DOB: 08/05/1995  REASON FOR VISIT: follow up HISTORY FROM: patient  Chief Complaint  Patient presents with   Room 1    Pt is here with her Fiance. Pt's fiance states that she had a Seizure last night. Pt states that she thinks she had gotten really hot and that what caused her seizure.     HISTORY OF PRESENT ILLNESS:  10/09/22 Latoya Stark returns for follow up for seizures. She was last seen by me 01/2022. She reported breakthrough seizures in setting of non compliance. EEG was normal. 72 hour EEG ordered but has not been performed. She was advised to take medications consistently as prescribed. We continued lev 1500mg  BID and lamotrigine 200mg  TID. I added topiramate 50mg  BID for headache management. She called the next week stating topiramate had previously made her headaches worse and requested a different med. Gabapentin 100mg  at bedtime was called in. We increased the dose to 200mg  at bedtime in 04/2022.   Since, she had not had any seizure events until last night. She reports getting into an argument with her family and another argument with her boyfriend. She felt herself getting really hot then blacked out. Her boyfriend reports that he walked in and saw her at the top of the stairs sitting down. She suddenly fell backwards and started shaking all over. Event lasted about 4-5 minutes then she seemed confused and groggy for another 2-3 minutes. She was back to baseline in 7-8 minutes.   She has continued lev, LTG and gabapentin as prescribed. She denies missed doses of ASM. She rdid not make an appt with neuro diagnostics for extended EEG due to being told she needed clearance from PCP. She has appt with PCP 11/17.   She feels headaches are better. She is tolerating gabapentin. She feels headaches wax and wane but, overall, seem less intense. She does endorse snoring. She has dry mouth and morning headaches.   01/19/2022 ALL: Latoya Stark is a 27 y.o.  female here today for follow up for seizures. She was last seen by Dr Epimenio Foot 03/2021 and reported continued seizure activity. She was 5 months gestation at the time. Levetiracetam continued at 1500mg  BID and lamotrigine increased from 100 to 200mg  BID. She called reporting continued seizure activity in 08/2021 and lamotrigine was increased to 200mg  TID. Her husband called 09/20/2021 reporting seizure lasting 3-4 minutes. Appt schedule with me 09/21/2021 but she did not show for appt. Two additional appts scheduled 09/23/21 and 12/14/21 but were cancelled.   Since, she reports she was doing better from 09/2021 until about 2 weeks ago. She was tolerating lamotrigine and levetiracetam and reportedly taking both consistently.   Her mother-in-law called today stating that she had ran out of medication about 10 days ago and had not told anyone. She reports moving to a new home and was taking old medication but stopped when she realized it was old but did not get new meds refilled. She had a seizure yesterday lasting about 3 minutes and had two seizures, today around 1:45pm lasting 4-5 minutes. Husband walked in at the end of the second event and reports her whole body was jerking. It appeared she was asleep. She was not able to communicate. She was back to baseline in 10-15 minutes.   Headaches are worsening. She reports headaches have been worse over the past two weeks. She takes Tylenol and ibuprofen with no improvement. She was on topiramate prior to pregnancy and tolerated it  well.   She is currently on depo for birth control. She is not breastfeeding. No plans for another pregnancy.   HISTORY: (copied from Dr Bonnita Hollow previous note)  Latoya Stark is a 27 y.o. woman with headaches and seizures.   UPDATE 03/15/2021:  She is currently 5 months pregnant.   She plans to breast x 6 months.      She brother reports she has a seizure with shaking every 2-4 weeks.   The shaking lasts one minute and she is unresponsive  afterwards for several minutes.    She takes Keppra 1500 mg twice a day and lamotigine 100 mg po bid.    She had een on topiramate and it was d/c early in pregnancy (as lamotrigine added).         She also gets a lot of headaches.   Topiramate had not helped.   With a headache, she gets some nausea but has a lot of photophobia (no phonophobia).      She was having left sided numbness at last visit.   MRI 11/2020 was normal.     She gets intermittent left leg numbness since 4-5 days ago.   She also had left leg numbness when pregnant in 2020/2021    At that time (02/10/2019) she had normal MRI of the cervical, thoracic and lumbar spine).  MRi brain 02/09/2019 was also normal.     Seizure history: She has had seizures since age 61.    In 05/2018,  she was sitting in the car talking to her boyfriend and then seized (2-3 minutes GTC) and was taken to the ED,    She reports being told that she was slurring her words and not making sense for a lttle bit before the seizure.  She also complained of a headache.    She went to the ED and was placed on Keppra.   She had another seizure 6/8/2022She was taken to [facility] ED.   She had CT head and cervical spine and had xrays of right shoulder and lumbar spine.     She had been on Keppra 1500 mg bid.     Her level was good at 23.7 (10-40 is range)   She is having a lot of headaches.   They are located left frontal or bitemporal.   Quality is pounding.     She is having 6 sever HA a month.   She notes lights sometimes trigger them.   When present, she has photophobia (not much phonophobia), nausea and rare vomiting.  Sometimes she feels lightheaded.  Moving worsens the pain.       I personally reviewed the CT scan of the head and cervical spine from 06/16/2020.  Both are normal.  There are no acute findings.   Laboratory tests from 06/16/2020 were also reviewed.  The Tox  screen showed benzodiazepines (received some before the test due to seizure).  CMP, CBC, hCG were  negative or noncontributory   Seizure History She had her first seizure at age 41.  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    She has been having one every 2-3 months, even while on treatment.  She stopped Dilantin in 2020 (10 days or so before the seizure prompting initial consult with me) due to pregnancy.    I switched her to Keppra, initially 750 mg twice daily and she was titrated up to 1500 mg twice daily due to breakthrough  activity.   She had additional breakthrough seizures in 2022 and on 06/22/2020 Topamax 100 mg twice daily was added.   HA history She has had migraine headaches since her teen years and the frequency increased in 2020 to become daily.  Generally, headaches are located bilaterally, forehead > temporal region.    She gets nausea, severe photophobia and minimal phonophobia.    Headaches usually improve if she sleeps them off.    Heat triggers the headache.  Light sometimes triggers the headaches Tylenol sometimes stops the headaches.   Topamax had helped her headaches with benefit in the past.  Keppra initially helped the headaches.   REVIEW OF SYSTEMS: Out of a complete 14 system review of symptoms, the patient complains only of the following symptoms, headaches, seizures and all other reviewed systems are negative.   ALLERGIES: Allergies  Allergen Reactions   Hydrocodone Hives   Milk-Related Compounds Dermatitis    Breaks face out *can have 2% milk*   Mushroom Extract Complex Anaphylaxis   Penicillins Anaphylaxis    Has patient had a PCN reaction causing immediate rash, facial/tongue/throat swelling, SOB or lightheadedness with hypotension: Yes Has patient had a PCN reaction causing severe rash involving mucus membranes or skin necrosis: No Has patient had a PCN reaction that required hospitalization No Has patient had a PCN reaction occurring within the last 10 years: Yes If all of the above answers are  "NO", then may proceed with Cephalosporin use.    Cheese Other (See Comments)    Breaks face out   Latex Itching and Swelling   Sulfa Antibiotics Swelling    HOME MEDICATIONS: Outpatient Medications Prior to Visit  Medication Sig Dispense Refill   albuterol (VENTOLIN HFA) 108 (90 Base) MCG/ACT inhaler Inhale 1-2 puffs into the lungs every 6 (six) hours as needed for wheezing or shortness of breath.     cetirizine (ZYRTEC) 10 MG tablet Take 10 mg by mouth daily.     cyclobenzaprine (FLEXERIL) 10 MG tablet Take 0.5-1 tablets (5-10 mg total) by mouth 3 (three) times daily as needed. 30 tablet 0   docusate sodium (COLACE) 100 MG capsule Take 100 mg by mouth daily as needed for mild constipation.     ferrous sulfate 325 (65 FE) MG EC tablet Take 325 mg by mouth daily.     gabapentin (NEURONTIN) 100 MG capsule Take 2 capsules (200 mg total) by mouth at bedtime. 180 capsule 3   lamoTRIgine (LAMICTAL) 200 MG tablet Take 1 tablet (200 mg total) by mouth 3 (three) times daily. 270 tablet 3   levETIRAcetam (KEPPRA) 750 MG tablet Take 2 tablets (1,500 mg total) by mouth 2 (two) times daily. 360 tablet 3   medroxyPROGESTERone (DEPO-PROVERA) 150 MG/ML injection Inject 1 mL (150 mg total) into the muscle every 3 (three) months. 1 mL 4   metoCLOPramide (REGLAN) 10 MG tablet Take 1 tablet (10 mg total) by mouth 3 (three) times daily before meals. Take 3 times a day for 30 days,Then take 2 a day for 4 days, Then take 1 a day for 4 days, Then stop 102 tablet 0   pantoprazole (PROTONIX) 40 MG tablet Take 1 tablet (40 mg total) by mouth 2 (two) times daily. 60 tablet 1   Prenatal Vit-Fe Fumarate-FA (MULTIVITAMIN-PRENATAL) 27-0.8 MG TABS tablet Take 1 tablet by mouth daily at 12 noon.     promethazine (PHENERGAN) 25 MG tablet Take 25 mg by mouth every 6 (six) hours as needed for nausea or vomiting.  SYMBICORT 80-4.5 MCG/ACT inhaler Inhale 2 puffs into the lungs 2 (two) times daily as needed (shortness of  breath).     acetaminophen (TYLENOL) 325 MG tablet Take 2 tablets (650 mg total) by mouth every 4 (four) hours as needed (for pain scale < 4). (Patient not taking: Reported on 10/09/2022)     ibuprofen (ADVIL) 200 MG tablet Take 800 mg by mouth every 8 (eight) hours as needed for moderate pain. (Patient not taking: Reported on 10/09/2022)     medroxyPROGESTERone (DEPO-PROVERA) 150 MG/ML injection Inject 1 mL (150 mg total) into the muscle every 3 (three) months. (Patient not taking: Reported on 10/02/2022) 1 mL 3   oxyCODONE (OXY IR/ROXICODONE) 5 MG immediate release tablet Take 1 tablet (5 mg total) by mouth every 6 (six) hours as needed for severe pain. (Patient not taking: Reported on 10/02/2022) 20 tablet 0   topiramate (TOPAMAX) 50 MG tablet Take 50 mg by mouth 2 (two) times daily. (Patient not taking: Reported on 10/09/2022)     Facility-Administered Medications Prior to Visit  Medication Dose Route Frequency Provider Last Rate Last Admin   medroxyPROGESTERone (DEPO-PROVERA) injection 150 mg  150 mg Intramuscular Q90 days Anyanwu, Ugonna A, MD   150 mg at 12/13/21 1135   medroxyPROGESTERone Acetate SUSY 150 mg  150 mg Intramuscular Q90 days [place] Bing, MD   150 mg at 03/09/22 1410    PAST MEDICAL HISTORY: 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   Headache    Obesity    Pneumonia    Postpartum depression, postpartum condition 08/11/2021   Seizures (HCC)    "when I get too hot"   Trichomonas infection    UTI (lower urinary tract infection)    Vulvar abscess 08/11/2021    PAST SURGICAL HISTORY: Past Surgical History:   Procedure Laterality Date   ADENOIDECTOMY     DILATION AND EVACUATION N/A 08/09/2013   Procedure: DILATATION AND EVACUATION;  Surgeon: Kathreen Cosier, MD;  Location: [facility];  Service: Gynecology;  Laterality: N/A;   EUSTACHIAN TUBE DILATION     ORTHOPEDIC SURGERY Left    left hand   TONSILLECTOMY     UMBILICAL HERNIA REPAIR N/A 05/12/2022   Procedure: LAPAROSCOPIC ASSISTED SUPRA UMBILICAL HERNIA REPAIR;  Surgeon: Luretha Murphy, MD;  Location: [facility];  Service: General;  Laterality: N/A;    FAMILY HISTORY: Family History  Problem Relation Age of Onset   Heart disease Mother    Cancer Mother        Bone cancer   Seizures Mother    Stroke Mother    Sickle cell anemia Mother    Hypertension Father    Seizures Maternal Aunt    Diabetes Maternal Grandmother    Colon cancer Maternal Grandmother    Hypertension Maternal Grandmother    Clotting disorder Maternal Grandmother    Breast cancer Maternal Grandmother    Stroke Maternal Grandmother    Diabetes Paternal Grandmother     SOCIAL HISTORY: Social History   Socioeconomic History   Marital status: Significant Other    Spouse name: Not on file  Number of children: 1   Years of education: Not on file   Highest education level: Not on file  Occupational History   Not on file  Tobacco Use   Smoking status: Never   Smokeless tobacco: Never  Vaping Use   Vaping status: Never Used  Substance and Sexual Activity   Alcohol use: Yes    Comment: occ   Drug use: No   Sexual activity: Not Currently    Partners: Male  Other Topics Concern   Not on file  Social History Narrative   Caffeine: tea sometimes   Lives with fiance   Right handed    Social Determinants of Health   Financial Resource Strain: Low Risk  (08/14/2018)   Overall Financial Resource Strain (CARDIA)    Difficulty of Paying Living Expenses: Not hard at all  Food Insecurity: Food Insecurity Present (08/14/2018)   Hunger Vital Sign    Worried About  Running Out of Food in the Last Year: Sometimes true    Ran Out of Food in the Last Year: Sometimes true  Transportation Needs: No Transportation Needs (08/14/2018)   PRAPARE - Administrator, Civil Service (Medical): No    Lack of Transportation (Non-Medical): No  Physical Activity: Unknown (08/14/2018)   Exercise Vital Sign    Days of Exercise per Week: 0 days    Minutes of Exercise per Session: Not on file  Stress: No Stress Concern Present (08/14/2018)   [employer] of Occupational Health - Occupational Stress Questionnaire    Feeling of Stress : Not at all  Social Connections: Socially Isolated (08/14/2018)   Social Connection and Isolation Panel [NHANES]    Frequency of Communication with Friends and Family: More than three times a week    Frequency of Social Gatherings with Friends and Family: More than three times a week    Attends Religious Services: Never    Database administrator or Organizations: No    Attends Banker Meetings: Never    Marital Status: Never married  Intimate Partner Violence: Not At Risk (08/14/2018)   Humiliation, Afraid, Rape, and Kick questionnaire    Fear of Current or Ex-Partner: No    Emotionally Abused: No    Physically Abused: No    Sexually Abused: No      PHYSICAL EXAM  Vitals:   10/09/22 1305  BP: (!) 140/87  Pulse: 100  Weight: (!) 336 lb (152.4 kg)  Height: 5\' 6"  (1.676 m)     Body mass index is 54.23 kg/m.  Generalized: Well developed, in no acute distress  Cardiology: normal rate and rhythm, no murmur noted Respiratory: clear to auscultation bilaterally  Neurological examination  Mentation: Alert oriented to time, place, history taking. Follows all commands speech and language fluent Cranial nerve II-XII: Pupils were equal round reactive to light. Extraocular movements were full, visual field were full  Motor: The motor testing reveals 5 over 5 strength of all 4 extremities. Good symmetric motor tone  is noted throughout.  Gait and station: Gait is normal.     DIAGNOSTIC DATA (LABS, IMAGING, TESTING) - I reviewed patient records, labs, notes, testing and imaging myself where available.      No data to display           Lab Results  Component Value Date   WBC 5.9 04/27/2022   HGB 13.0 04/27/2022   HCT 40.7 04/27/2022   MCV 86.6 04/27/2022   PLT 384 04/27/2022  Component Value Date/Time   NA 136 04/27/2022 0918   NA 137 12/30/2020 1153   K 3.9 04/27/2022 0918   CL 104 04/27/2022 0918   CO2 23 04/27/2022 0918   GLUCOSE 101 (H) 04/27/2022 0918   BUN 9 04/27/2022 0918   BUN 5 (L) 12/30/2020 1153   CREATININE 0.75 04/27/2022 0918   CALCIUM 9.1 04/27/2022 0918   PROT 6.5 05/26/2021 1637   PROT 6.8 12/30/2020 1153   ALBUMIN 3.1 (L) 05/26/2021 1637   ALBUMIN 4.1 12/30/2020 1153   AST 22 05/26/2021 1637   ALT 26 05/26/2021 1637   ALKPHOS 117 05/26/2021 1637   BILITOT 0.3 05/26/2021 1637   BILITOT <0.2 12/30/2020 1153   GFRNONAA >60 04/27/2022 0918   GFRAA >60 02/20/2019 2329   Lab Results  Component Value Date   CHOL  12/13/2006    122        ATP III CLASSIFICATION:  <200     mg/dL   Desirable  130-865  mg/dL   Borderline High  >=784    mg/dL   High   HDL 30 (L) 69/62/9528   LDLCALC  12/13/2006    76        Total Cholesterol/HDL:CHD Risk Coronary Heart Disease Risk Table                     Men   Women  1/2 Average Risk   3.4   3.3   TRIG 78 12/13/2006   CHOLHDL 4.1 12/13/2006   Lab Results  Component Value Date   HGBA1C 5.5 06/07/2021   Lab Results  Component Value Date   VITAMINB12 309 02/24/2014   Lab Results  Component Value Date   TSH 1.430 09/26/2022     ASSESSMENT AND PLAN 27 y.o. 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 here with     ICD-10-CM   1. 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 Chronic migraine without aura without status migrainosus, not intractable  G43.709     3. Snoring  R06.83 Home sleep test    4. Dry mouth  R68.2 Home sleep test    5. Morning headache  R51.9 Home sleep test     Abimbola reports having seizure event last night in setting of increased stress. Previous event was 01/2022. She will continue levetiracetam 1500mg  twice daily and lamotrigine 200mg  three times daily. We will continue gabapentin 200mg  daily for migraine prevention. I have advised she follow up with PCP asap and we will plan to complete extended EEG following. I will update labs. I will place orders for HST to rule out sleep apnea. Driving restrictions advised for 6 months. She was encouraged to work on healthy lifestyle habits. Sleep hygiene encouraged. She will follow up pending workup.    Orders Placed This Encounter  Procedures   Levetiracetam level   Lamotrigine level   Gabapentin level   Home sleep test    Standing Status:   Future    Standing Expiration Date:   10/09/2023    Order Specific  Question:   Where should this test be performed:    Answer:   [facility]     No orders of the defined types were placed in this encounter.    I spent 30 minutes with the patient. 50% of this time was spent counseling and educating patient on plan of care and medications.     Shawnie Dapper, FNP-C 10/09/2022, 2:47 PM [facility] [address], [address] [place], [place] [address] [phone]

## 2022-10-04 NOTE — Patient Instructions (Signed)
Below is our plan:  We will continue levetiracetam 1500mg  twice daily, lamotrigine 200mg  three times daily and gabapentin 200mg  at bedtime.   Please make sure you are consistent with timing of seizure medication. I recommend annual visit with primary care provider (PCP) for complete physical and routine blood work. I recommend daily intake of vitamin D (400-800iu) and calcium (800-1000mg ) for bone health. Discuss Dexa screening with PCP.   According to [place] law, you can not drive unless you are seizure / syncope free for at least 6 months and under physician's care.  Please maintain precautions. Do not participate in activities where a loss of awareness could harm you or someone else. No swimming alone, no tub bathing, no hot tubs, no driving, no operating motorized vehicles (cars, ATVs, motocycles, etc), lawnmowers, power tools or firearms. No standing at heights, such as rooftops, ladders or stairs. Avoid hot objects such as stoves, heaters, open fires. Wear a helmet when riding a bicycle, scooter, skateboard, etc. and avoid areas of traffic. Set your water heater to 120 degrees or less.  SUDEP is the sudden, unexpected death of someone with epilepsy, who was otherwise healthy. In SUDEP cases, no other cause of death is found when an autopsy is done. Each year, more than 1 in 1,000 people with epilepsy die from SUDEP. This is the leading cause of death in people with uncontrolled seizures. Until further answers are available, the best way to prevent SUDEP is to lower your risk by controlling seizures. Research has found that people with all types of epilepsy that experience convulsive seizures can be at risk.  Please make sure you are staying well hydrated. I recommend 50-60 ounces daily. Well balanced diet and regular exercise encouraged. Consistent sleep schedule with 6-8 hours recommended.   Please continue follow up with care team as directed.   Follow up with me in 6 months   You may receive a  survey regarding today's visit. I encourage you to leave honest feed back as I do use this information to improve patient care. Thank you for seeing me today!

## 2022-10-05 NOTE — Addendum Note (Signed)
Addended by: Adela Ports on: 10/05/2022 03:57 PM   Modules accepted: Orders

## 2022-10-05 NOTE — Addendum Note (Signed)
Addended by: Adela Ports on: 10/05/2022 08:51 AM   Modules accepted: Orders

## 2022-10-05 NOTE — Addendum Note (Signed)
Addended by: Adela Ports on: 10/05/2022 09:08 AM   Modules accepted: Orders

## 2022-10-09 ENCOUNTER — Ambulatory Visit (INDEPENDENT_AMBULATORY_CARE_PROVIDER_SITE_OTHER): Payer: Medicaid Other | Admitting: Family Medicine

## 2022-10-09 ENCOUNTER — Encounter: Payer: Self-pay | Admitting: Family Medicine

## 2022-10-09 ENCOUNTER — Telehealth: Payer: Self-pay | Admitting: *Deleted

## 2022-10-09 VITALS — BP 140/87 | HR 100 | Ht 66.0 in | Wt 336.0 lb

## 2022-10-09 DIAGNOSIS — G40409 Other generalized epilepsy and epileptic syndromes, not intractable, without status epilepticus: Secondary | ICD-10-CM

## 2022-10-09 DIAGNOSIS — R0683 Snoring: Secondary | ICD-10-CM

## 2022-10-09 DIAGNOSIS — G43709 Chronic migraine without aura, not intractable, without status migrainosus: Secondary | ICD-10-CM

## 2022-10-09 DIAGNOSIS — R682 Dry mouth, unspecified: Secondary | ICD-10-CM

## 2022-10-09 DIAGNOSIS — R519 Headache, unspecified: Secondary | ICD-10-CM

## 2022-10-09 NOTE — Telephone Encounter (Signed)
-----   Message from Amy Lomax sent at 10/09/2022  2:49 PM EDT ----- Can you guys please check on the extended EEG. Patient reports needing clearance from PCP. Unable to be seen until 11/2022.

## 2022-10-09 NOTE — Telephone Encounter (Signed)
I called [facility] and was told they reached out to patient several times. Pt insurance [facility] Medicaid requires PCP referral only, cannot be from Neurology and pt was aware of this.  [facility] states pt told them that once she has visit scheduled she would call [facility] back. [facility] state they never heard back from patient.

## 2022-10-11 ENCOUNTER — Ambulatory Visit
Admission: RE | Admit: 2022-10-11 | Discharge: 2022-10-11 | Disposition: A | Discharge status: Home / Self Care | Payer: Medicaid Other | Source: Ambulatory Visit | Attending: Gastroenterology | Admitting: Gastroenterology

## 2022-10-11 ENCOUNTER — Telehealth: Payer: Self-pay | Admitting: Family Medicine

## 2022-10-11 DIAGNOSIS — R221 Localized swelling, mass and lump, neck: Secondary | ICD-10-CM | POA: Insufficient documentation

## 2022-10-11 DIAGNOSIS — R1319 Other dysphagia: Secondary | ICD-10-CM | POA: Insufficient documentation

## 2022-10-11 LAB — LEVETIRACETAM LEVEL: Levetiracetam Lvl: 27.3 ug/mL (ref 10.0–40.0)

## 2022-10-11 LAB — LAMOTRIGINE LEVEL: Lamotrigine Lvl: 5.9 ug/mL (ref 2.0–20.0)

## 2022-10-11 LAB — GABAPENTIN LEVEL: Gabapentin Lvl: <1 ug/mL — ABNORMAL LOW (ref 4.0–16.0)

## 2022-10-11 NOTE — Telephone Encounter (Signed)
[facility]- [facility] pending faxed notes.

## 2022-10-18 ENCOUNTER — Other Ambulatory Visit: Payer: Self-pay

## 2022-10-18 ENCOUNTER — Telehealth: Payer: Self-pay

## 2022-10-18 DIAGNOSIS — R221 Localized swelling, mass and lump, neck: Secondary | ICD-10-CM

## 2022-10-18 NOTE — Telephone Encounter (Signed)
-----   Message from Wyline Mood sent at 10/16/2022  8:38 AM EDT ----- Kandis Cocking inform Nerissa that the usg suggests the swelling at base of neck is likely fat and benign - will C/c Berniece Salines, FNP to follow up at her next office visit with [facility]

## 2022-10-18 NOTE — Telephone Encounter (Signed)
Patient works with [place] and I was able to give her a copy of her ultrasound results and Dr. Johnney Killian recommendations. Patient understood and had no further questions.

## 2022-10-23 NOTE — Telephone Encounter (Signed)
HST- [facility] [name]: [id number] (exp. 10/23/22 to 01/21/23)

## 2022-10-30 ENCOUNTER — Encounter: Payer: Self-pay | Admitting: Surgery

## 2022-10-30 ENCOUNTER — Ambulatory Visit (INDEPENDENT_AMBULATORY_CARE_PROVIDER_SITE_OTHER): Payer: Medicaid Other | Admitting: Surgery

## 2022-10-30 VITALS — BP 132/88 | HR 86 | Temp 98.2°F | Ht 65.0 in | Wt 330.6 lb

## 2022-10-30 DIAGNOSIS — D170 Benign lipomatous neoplasm of skin and subcutaneous tissue of head, face and neck: Secondary | ICD-10-CM

## 2022-10-30 NOTE — Patient Instructions (Addendum)
Your CT is scheduled for 11/08/2022 9 am (arrive by 8:45 am) at [facility] on [address].   If you have any concerns or questions, please feel free to call our office. See follow up appointment.    Lipoma  A lipoma is a noncancerous (benign) tumor that is made up of fat cells. This is a very common type of soft-tissue growth. Lipomas are usually found under the skin (subcutaneous). They may occur in any tissue of the body that contains fat. Common areas for lipomas to appear include the back, arms, shoulders, buttocks, and thighs. Lipomas grow slowly, and they are usually painless. Most lipomas do not cause problems and do not require treatment. What are the causes? The cause of this condition is not known. What increases the risk? You are more likely to develop this condition if: You are 72-59 years old. You have a family history of lipomas. What are the signs or symptoms? A lipoma usually appears as a small, round bump under the skin. In most cases, the lump will: Feel soft or rubbery. Not cause pain or other symptoms. However, if a lipoma is located in an area where it pushes on nerves, it can become painful or cause other symptoms. How is this diagnosed? A lipoma can usually be diagnosed with a physical exam. You may also have tests to confirm the diagnosis and to rule out other conditions. Tests may include: Imaging tests, such as a CT scan or an MRI. Removal of a tissue sample to be looked at under a microscope (biopsy). How is this treated? Treatment for this condition depends on the size of the lipoma and whether it is causing any symptoms. For small lipomas that are not causing problems, no treatment is needed. If a lipoma is bigger or it causes problems, surgery may be done to remove the lipoma. Lipomas can also be removed to improve appearance. Most often, the procedure is done after applying a medicine that numbs the area (local anesthetic). Liposuction may be  done to reduce the size of the lipoma before it is removed through surgery, or it may be done to remove the lipoma. Lipomas are removed with this method to limit incision size and scarring. A liposuction tube is inserted through a small incision into the lipoma, and the contents of the lipoma are removed through the tube with suction. Follow these instructions at home: Watch your lipoma for any changes. Keep all follow-up visits. This is important. Where to find more information OrthoInfo: orthoinfo.aaos.org Contact a health care provider if: Your lipoma becomes larger or hard. Your lipoma becomes painful, red, or increasingly swollen. These could be signs of infection or a more serious condition. Get help right away if: You develop tingling or numbness in an area near the lipoma. This could indicate that the lipoma is causing nerve damage. Summary A lipoma is a noncancerous tumor that is made up of fat cells. Most lipomas do not cause problems and do not require treatment. If a lipoma is bigger or it causes problems, surgery may be done to remove the lipoma. Contact a health care provider if your lipoma becomes larger or hard, or if it becomes painful, red, or increasingly swollen. These could be signs of infection or a more serious condition. This information is not intended to replace advice given to you by your health care provider. Make sure you discuss any questions you have with your health care provider. Document Revised: 01/14/2021 Document Reviewed: 01/14/2021 Elsevier Patient Education  2024 Elsevier Inc.

## 2022-10-31 NOTE — Progress Notes (Unsigned)
Patient ID: JAI OVERFIELD, female   DOB: [birth year], 27 y.o.   MRN: [id number]  HPI Latoya Stark is a 27 y.o. female in in consultation for a soft tissue anterior neck mass.  She states that it has grown over time.  To give her some discomfort and compressive symptoms.  For some occasional dysphagia. 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 BMI is 55.  Does have a history of seizure disorder. Months ago she also had a laparoscopic repair of umbilical hernia with mesh by Dr. Daphine Deutscher.  Please note that I have personally reviewed the op report.  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   Headache    Obesity    Pneumonia    Postpartum depression, postpartum condition 08/11/2021   Seizures (HCC)    "when I get too hot"   Trichomonas infection    UTI (lower urinary tract infection)    Vulvar abscess 08/11/2021    Past Surgical History:  Procedure Laterality Date   ADENOIDECTOMY     DILATION AND EVACUATION N/A 08/09/2013   Procedure: DILATATION AND EVACUATION;  Surgeon: Kathreen Cosier, MD;  Location: [facility];  Service: Gynecology;  Laterality: N/A;   EUSTACHIAN TUBE DILATION     ORTHOPEDIC SURGERY Left    left hand   TONSILLECTOMY     UMBILICAL HERNIA REPAIR N/A 05/12/2022   Procedure: LAPAROSCOPIC ASSISTED SUPRA UMBILICAL HERNIA REPAIR;  Surgeon: Luretha Murphy, MD;  Location: [facility];  Service: General;  Laterality: N/A;    Family History  Problem Relation Age of Onset   Heart disease Mother    Cancer Mother         Bone cancer   Seizures Mother    Stroke Mother    Sickle cell anemia Mother    Hypertension Father    Seizures Maternal Aunt    Diabetes Maternal Grandmother    Colon cancer Maternal Grandmother    Hypertension Maternal Grandmother    Clotting disorder Maternal Grandmother    Breast cancer Maternal Grandmother    Stroke Maternal Grandmother    Diabetes Paternal Grandmother     Social History Social History   Tobacco Use   Smoking status: Never   Smokeless tobacco: Never  Vaping Use   Vaping status: Never Used  Substance Use Topics   Alcohol use: Yes    Comment: occ   Drug use: No    Allergies  Allergen Reactions   Hydrocodone Hives   Milk-Related Compounds Dermatitis    Breaks face out *can have 2% milk*   Mushroom Extract Complex Anaphylaxis   Penicillins Anaphylaxis    Has patient had a PCN reaction causing immediate rash, facial/tongue/throat swelling, SOB or lightheadedness with hypotension: Yes Has patient had a PCN reaction causing severe rash involving mucus membranes or skin necrosis: No Has patient had a PCN reaction that required hospitalization No Has patient had a PCN reaction occurring within the last 10 years: Yes If all of the above answers are "  NO", then may proceed with Cephalosporin use.    Cheese Other (See Comments)    Breaks face out   Latex Itching and Swelling   Sulfa Antibiotics Swelling    Current Outpatient Medications  Medication Sig Dispense Refill   albuterol (VENTOLIN HFA) 108 (90 Base) MCG/ACT inhaler Inhale 1-2 puffs into the lungs every 6 (six) hours as needed for wheezing or shortness of breath.     cetirizine (ZYRTEC) 10 MG tablet Take 10 mg by mouth daily.     cyclobenzaprine (FLEXERIL) 10 MG tablet Take 0.5-1 tablets (5-10 mg total) by mouth 3 (three) times daily as needed. 30 tablet 0   docusate sodium (COLACE) 100 MG capsule Take 100 mg by mouth daily as needed for mild constipation.     ferrous sulfate 325 (65 FE) MG EC  tablet Take 325 mg by mouth daily.     gabapentin (NEURONTIN) 100 MG capsule Take 2 capsules (200 mg total) by mouth at bedtime. 180 capsule 3   ibuprofen (ADVIL) 200 MG tablet Take 800 mg by mouth every 8 (eight) hours as needed for moderate pain (pain score 4-6).     lamoTRIgine (LAMICTAL) 200 MG tablet Take 1 tablet (200 mg total) by mouth 3 (three) times daily. 270 tablet 3   levETIRAcetam (KEPPRA) 750 MG tablet Take 2 tablets (1,500 mg total) by mouth 2 (two) times daily. 360 tablet 3   medroxyPROGESTERone (DEPO-PROVERA) 150 MG/ML injection Inject 1 mL (150 mg total) into the muscle every 3 (three) months. 1 mL 3   medroxyPROGESTERone (DEPO-PROVERA) 150 MG/ML injection Inject 1 mL (150 mg total) into the muscle every 3 (three) months. 1 mL 4   metoCLOPramide (REGLAN) 10 MG tablet Take 1 tablet (10 mg total) by mouth 3 (three) times daily before meals. Take 3 times a day for 30 days,Then take 2 a day for 4 days, Then take 1 a day for 4 days, Then stop 102 tablet 0   pantoprazole (PROTONIX) 40 MG tablet Take 1 tablet (40 mg total) by mouth 2 (two) times daily. 60 tablet 1   Prenatal Vit-Fe Fumarate-FA (MULTIVITAMIN-PRENATAL) 27-0.8 MG TABS tablet Take 1 tablet by mouth daily at 12 noon.     promethazine (PHENERGAN) 25 MG tablet Take 25 mg by mouth every 6 (six) hours as needed for nausea or vomiting.     SYMBICORT 80-4.5 MCG/ACT inhaler Inhale 2 puffs into the lungs 2 (two) times daily as needed (shortness of breath).     Current Facility-Administered Medications  Medication Dose Route Frequency Provider Last Rate Last Admin   medroxyPROGESTERone (DEPO-PROVERA) injection 150 mg  150 mg Intramuscular Q90 days Anyanwu, Jethro Bastos, MD   150 mg at 12/13/21 1135   medroxyPROGESTERone Acetate SUSY 150 mg  150 mg Intramuscular Q90 days Lamoille Bing, MD   150 mg at 03/09/22 1410     Review of Systems Full ROS  was asked and was negative except for the information on the HPI  Physical Exam Blood  pressure 132/88, pulse 86, temperature 98.2 F (36.8 C), temperature source Oral, height 5\' 5"  (1.651 m), weight (!) 330 lb 9.6 oz (150 kg), SpO2 98%, not currently breastfeeding. CONSTITUTIONAL: NAD alert. EYES: Pupils are equal, round,  Sclera are non-icteric. EARS, NOSE, MOUTH AND THROAT: The oropharynx is clear. The oral mucosa is pink and moist. Hearing is intact to voice. LYMPH NODES:  Lymph nodes in the neck are normal. RESPIRATORY:  Lungs are clear. There is normal  Neck: There is  a large, greater than 12 cm soft tissue mass on anterior aspect of the neck extending past the sternal notch.  This is mobile and is consistent with a lipoma.  Respiratory effort, with equal breath sounds bilaterally, and without pathologic use of accessory muscles. CARDIOVASCULAR: Heart is regular without murmurs, gallops, or rubs. GI: The abdomen is  soft, nontender, and nondistended. There are no palpable masses. There is no hepatosplenomegaly. There are normal bowel sounds in all quadrants. GU: Rectal deferred.   MUSCULOSKELETAL: Normal muscle strength and tone. No cyanosis or edema.   SKIN: Turgor is good and there are no pathologic skin lesions or ulcers. NEUROLOGIC: Motor and sensation is grossly normal. Cranial nerves are grossly intact. PSYCH:  Oriented to person, place and time. Affect is normal.  Data Reviewed I have personally reviewed the patient's imaging, laboratory findings and medical records.    Assessment/Plan  27 year old female with right anterior chest wall soft tissue mass extending into the neck.  This is consistent with a very large lipoma.  Given its size and the location of the neck and potential complicating factors with airway I would like to obtain a CT scan of the neck to address preoperative planning.  Once we obtain the CT scan I will see her back and we will schedule excision under general anesthetic given its size and high risk airway. Please note that I spent 45 minutes in  this encounter including personally reviewing imaging studies, medical records, coordinating her care, placing orders and performing appropriate documentation  Sterling Big, MD FACS General Surgeon 10/31/2022, 7:43 AM

## 2022-11-02 ENCOUNTER — Other Ambulatory Visit: Payer: Self-pay

## 2022-11-02 MED ORDER — TRULANCE 3 MG PO TABS: 1.0000 | ORAL_TABLET | Freq: Every day | ORAL | 11 refills | Status: DC

## 2022-11-07 ENCOUNTER — Ambulatory Visit: Payer: Medicaid Other | Admitting: Gastroenterology

## 2022-11-08 ENCOUNTER — Ambulatory Visit: Admission: RE | Admit: 2022-11-08 | Payer: Medicaid Other | Source: Ambulatory Visit

## 2022-11-09 ENCOUNTER — Ambulatory Visit
Admission: RE | Admit: 2022-11-09 | Discharge: 2022-11-09 | Disposition: A | Discharge status: Home / Self Care | Payer: Medicaid Other | Source: Ambulatory Visit | Attending: Surgery | Admitting: Surgery

## 2022-11-09 DIAGNOSIS — D170 Benign lipomatous neoplasm of skin and subcutaneous tissue of head, face and neck: Secondary | ICD-10-CM | POA: Insufficient documentation

## 2022-11-09 MED ORDER — IOHEXOL 300 MG/ML  SOLN
75.0000 mL | Freq: Once | INTRAMUSCULAR | Status: AC | PRN
  Administered 2022-11-09: 75 mL via INTRAVENOUS

## 2022-11-09 MED ORDER — SODIUM CHLORIDE 0.9 % IV SOLN: INTRAVENOUS | Status: DC

## 2022-11-09 NOTE — Telephone Encounter (Signed)
[name]: [id number] (exp. 10/23/22 to 01/21/23)   Patient is scheduled at [facility] for 11/29/22 at 4 pm.  Mailed packet to the patient.

## 2022-11-10 ENCOUNTER — Encounter: Payer: Self-pay | Admitting: Gastroenterology

## 2022-11-10 ENCOUNTER — Ambulatory Visit: Payer: Medicaid Other

## 2022-11-10 ENCOUNTER — Encounter
Admission: RE | Disposition: A | Discharge status: Home / Self Care | Payer: Self-pay | Source: Home / Self Care | Attending: Gastroenterology

## 2022-11-10 ENCOUNTER — Ambulatory Visit
Admission: RE | Admit: 2022-11-10 | Discharge: 2022-11-10 | Disposition: A | Discharge status: Home / Self Care | Payer: Medicaid Other | Attending: Gastroenterology | Admitting: Gastroenterology

## 2022-11-10 DIAGNOSIS — K317 Polyp of stomach and duodenum: Secondary | ICD-10-CM

## 2022-11-10 DIAGNOSIS — K2289 Other specified disease of esophagus: Secondary | ICD-10-CM | POA: Diagnosis not present

## 2022-11-10 DIAGNOSIS — R1310 Dysphagia, unspecified: Secondary | ICD-10-CM | POA: Diagnosis present

## 2022-11-10 HISTORY — PX: ESOPHAGOGASTRODUODENOSCOPY (EGD) WITH PROPOFOL: SHX5813

## 2022-11-10 LAB — GLUCOSE, CAPILLARY: Glucose-Capillary: 104 mg/dL — ABNORMAL HIGH (ref 70–99)

## 2022-11-10 SURGERY — ESOPHAGOGASTRODUODENOSCOPY (EGD) WITH PROPOFOL
Anesthesia: General

## 2022-11-10 MED ORDER — MIDAZOLAM HCL 5 MG/5ML IJ SOLN
INTRAMUSCULAR | Status: DC | PRN
Start: 2022-11-10 — End: 2022-11-10
  Administered 2022-11-10: 1 mg via INTRAVENOUS

## 2022-11-10 MED ORDER — PROPOFOL 500 MG/50ML IV EMUL
INTRAVENOUS | Status: DC | PRN
Start: 2022-11-10 — End: 2022-11-10
  Administered 2022-11-10 (×3): 20 mg via INTRAVENOUS
  Administered 2022-11-10: 100 mg via INTRAVENOUS

## 2022-11-10 MED ORDER — LIDOCAINE HCL (PF) 2 % IJ SOLN
INTRAMUSCULAR | Status: DC | PRN
  Administered 2022-11-10: 100 mg via INTRADERMAL

## 2022-11-10 MED ORDER — FENTANYL CITRATE (PF) 100 MCG/2ML IJ SOLN
INTRAMUSCULAR | Status: AC
  Filled 2022-11-10: qty 2

## 2022-11-10 MED ORDER — SODIUM CHLORIDE 0.9 % IV SOLN
INTRAVENOUS | Status: DC
  Administered 2022-11-10: 20 mL/h via INTRAVENOUS

## 2022-11-10 MED ORDER — MIDAZOLAM HCL 2 MG/2ML IJ SOLN
INTRAMUSCULAR | Status: AC
  Filled 2022-11-10: qty 2

## 2022-11-10 NOTE — Anesthesia Postprocedure Evaluation (Signed)
Anesthesia Post Note  Patient: Latoya Stark  Procedure(s) Performed: ESOPHAGOGASTRODUODENOSCOPY (EGD) WITH PROPOFOL  Patient location during evaluation: Endoscopy Anesthesia Type: General Level of consciousness: awake and alert Pain management: pain level controlled Vital Signs Assessment: post-procedure vital signs reviewed and stable Respiratory status: spontaneous breathing, nonlabored ventilation and respiratory function stable Cardiovascular status: blood pressure returned to baseline and stable Postop Assessment: no apparent nausea or vomiting Anesthetic complications: no   There were no known notable events for this encounter.   Last Vitals:  Vitals:   11/10/22 0929 11/10/22 0939  BP: 101/69 109/73  Pulse: (!) 107 100  Resp: 16 (!) 28  Temp:    SpO2: 96% 99%    Last Pain:  Vitals:   11/10/22 0939  TempSrc:   PainSc: 0-No pain                 Foye Deer

## 2022-11-10 NOTE — H&P (Signed)
Wyline Mood, MD [address], [place], [place], [address] [address], [place], [place], [address] Phone: [phone]  Fax: [phone]  Primary Care Physician:  Berniece Salines, FNP   Pre-Procedure History & Physical: HPI:  Latoya Stark is a 27 y.o. 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   Headache    Obesity    Pneumonia    Postpartum depression, postpartum condition 08/11/2021   Seizures (HCC)    "when I get too hot"   Trichomonas infection    UTI (lower urinary tract infection)    Vulvar abscess 08/11/2021    Past Surgical History:  Procedure Laterality Date   ADENOIDECTOMY     DILATION AND EVACUATION N/A 08/09/2013   Procedure: DILATATION AND EVACUATION;  Surgeon: Kathreen Cosier, MD;  Location: [facility];  Service: Gynecology;  Laterality: N/A;   EUSTACHIAN TUBE DILATION     ORTHOPEDIC SURGERY Left    left hand   TONSILLECTOMY     UMBILICAL HERNIA REPAIR N/A 05/12/2022   Procedure: LAPAROSCOPIC ASSISTED SUPRA UMBILICAL HERNIA REPAIR;  Surgeon: Luretha Murphy, MD;  Location: [facility];  Service: General;  Laterality: N/A;    Prior to Admission medications   Medication Sig Start Date End Date Taking? Authorizing Provider  albuterol (VENTOLIN HFA) 108 (90 Base) MCG/ACT inhaler Inhale 1-2 puffs into the lungs every 6 (six) hours as needed for wheezing or shortness of breath.    [provider]  cetirizine (ZYRTEC) 10 MG tablet Take 10 mg by mouth daily. 12/04/21   [provider]  cyclobenzaprine (FLEXERIL) 10 MG tablet Take 0.5-1  tablets (5-10 mg total) by mouth 3 (three) times daily as needed. 10/07/21   Margaretann Loveless, PA-C  docusate sodium (COLACE) 100 MG capsule Take 100 mg by mouth daily as needed for mild constipation.    [provider]  ferrous sulfate 325 (65 FE) MG EC tablet Take 325 mg by mouth daily.    [provider]  gabapentin (NEURONTIN) 100 MG capsule Take 2 capsules (200 mg total) by mouth at bedtime. 04/19/22   Lomax, Amy, NP  ibuprofen (ADVIL) 200 MG tablet Take 800 mg by mouth every 8 (eight) hours as needed for moderate pain (pain score 4-6).    [provider]  lamoTRIgine (LAMICTAL) 200 MG tablet Take 1 tablet (200 mg total) by mouth 3 (three) times daily. 01/19/22   Lomax, Amy, NP  levETIRAcetam (KEPPRA) 750 MG tablet Take 2 tablets (1,500 mg total) by mouth 2 (two) times daily. 01/19/22   Lomax, Amy, NP  medroxyPROGESTERone (DEPO-PROVERA) 150 MG/ML injection Inject 1 mL (150 mg total) into the muscle every 3 (three) months. 08/11/21   Simpson Bing, MD  medroxyPROGESTERone (DEPO-PROVERA) 150 MG/ML injection Inject 1 mL (150 mg total) into the muscle every 3 (three) months. 10/02/22   [place] Bing, MD  metoCLOPramide (REGLAN) 10 MG tablet Take 1 tablet (10 mg total) by mouth 3 (three) times daily before meals. Take 3 times a day for  30 days,Then take 2 a day for 4 days, Then take 1 a day for 4 days, Then stop 10/27/21   Reva Bores, MD  pantoprazole (PROTONIX) 40 MG tablet Take 1 tablet (40 mg total) by mouth 2 (two) times daily. 09/28/22   Wyline Mood, MD  Plecanatide (TRULANCE) 3 MG TABS Take 1 tablet (3 mg total) by mouth daily. 11/02/22   Wyline Mood, MD  Prenatal Vit-Fe Fumarate-FA (MULTIVITAMIN-PRENATAL) 27-0.8 MG TABS tablet Take 1 tablet by mouth daily at 12 noon.    [provider]  promethazine (PHENERGAN) 25 MG tablet Take 25 mg by mouth every 6 (six) hours as needed for nausea or vomiting. 12/04/21   [provider]  SYMBICORT 80-4.5  MCG/ACT inhaler Inhale 2 puffs into the lungs 2 (two) times daily as needed (shortness of breath). 12/04/21   [provider]    Allergies as of 09/26/2022 - Review Complete 05/12/2022  Allergen Reaction Noted   Hydrocodone Hives 05/18/2021   Milk-related compounds Dermatitis 02/24/2014   Mushroom extract complex Anaphylaxis 07/20/2014   Penicillins Anaphylaxis 02/07/2011   Cheese Other (See Comments) 02/15/2014   Latex Itching and Swelling 04/28/2014   Sulfa antibiotics Swelling 11/30/2011    Family History  Problem Relation Age of Onset   Heart disease Mother    Cancer Mother        Bone cancer   Seizures Mother    Stroke Mother    Sickle cell anemia Mother    Hypertension Father    Seizures Maternal Aunt    Diabetes Maternal Grandmother    Colon cancer Maternal Grandmother    Hypertension Maternal Grandmother    Clotting disorder Maternal Grandmother    Breast cancer Maternal Grandmother    Stroke Maternal Grandmother    Diabetes Paternal Grandmother     Social History   Socioeconomic History   Marital status: Significant Other    Spouse name: Not on file   Number of children: 1   Years of education: Not on file   Highest education level: Not on file  Occupational History   Not on file  Tobacco Use   Smoking status: Never   Smokeless tobacco: Never  Vaping Use   Vaping status: Never Used  Substance and Sexual Activity   Alcohol use: Yes    Comment: occ   Drug use: No   Sexual activity: Not Currently    Partners: Male  Other Topics Concern   Not on file  Social History Narrative   Caffeine: tea sometimes   Lives with fiance   Right handed    Social Determinants of Health   Financial Resource Strain: Low Risk  (08/14/2018)   Overall Financial Resource Strain (CARDIA)    Difficulty of Paying Living Expenses: Not hard at all  Food Insecurity: Food Insecurity Present (08/14/2018)   Hunger Vital Sign    Worried About Running Out of Food in the  Last Year: Sometimes true    Ran Out of Food in the Last Year: Sometimes true  Transportation Needs: No Transportation Needs (08/14/2018)   PRAPARE - Administrator, Civil Service (Medical): No    Lack of Transportation (Non-Medical): No  Physical Activity: Unknown (08/14/2018)   Exercise Vital Sign    Days of Exercise per Week: 0 days    Minutes of Exercise per Session: Not on file  Stress: No Stress Concern Present (08/14/2018)   [employer] of Occupational Health - Occupational Stress Questionnaire    Feeling  of Stress : Not at all  Social Connections: Socially Isolated (08/14/2018)   Social Connection and Isolation Panel [NHANES]    Frequency of Communication with Friends and Family: More than three times a week    Frequency of Social Gatherings with Friends and Family: More than three times a week    Attends Religious Services: Never    Database administrator or Organizations: No    Attends Banker Meetings: Never    Marital Status: Never married  Intimate Partner Violence: Not At Risk (08/14/2018)   Humiliation, Afraid, Rape, and Kick questionnaire    Fear of Current or Ex-Partner: No    Emotionally Abused: No    Physically Abused: No    Sexually Abused: No    Review of Systems: See HPI, otherwise negative ROS  Physical Exam: LMP 10/04/2022 (Approximate)  General:   Alert,  pleasant and cooperative in NAD Head:  Normocephalic and atraumatic. Neck:  Supple; no masses or thyromegaly. Lungs:  Clear throughout to auscultation, normal respiratory effort.    Heart:  +S1, +S2, Regular rate and rhythm, No edema. Abdomen:  Soft, nontender and nondistended. Normal bowel sounds, without guarding, and without rebound.   Neurologic:  Alert and  oriented x4;  grossly normal neurologically.  Impression/Plan: Latoya Stark is here for an endoscopy  to be performed for  evaluation of dysphagia    Risks, benefits, limitations, and alternatives regarding  endoscopy have been reviewed with the patient.  Questions have been answered.  All parties agreeable.   Wyline Mood, MD  11/10/2022, 8:22 AM

## 2022-11-10 NOTE — Transfer of Care (Signed)
Immediate Anesthesia Transfer of Care Note  Patient: Latoya Stark  Procedure(s) Performed: ESOPHAGOGASTRODUODENOSCOPY (EGD) WITH PROPOFOL  Patient Location: PACU  Anesthesia Type:General  Level of Consciousness: awake  Airway & Oxygen Therapy: Patient Spontanous Breathing  Post-op Assessment: Report given to RN and Post -op Vital signs reviewed and stable  Post vital signs: Reviewed and stable  Last Vitals:  Vitals Value Taken Time  BP 101/69 11/10/22 0929  Temp    Pulse 102 11/10/22 0935  Resp 29 11/10/22 0935  SpO2 96 % 11/10/22 0935  Vitals shown include unfiled device data.  Last Pain:  Vitals:   11/10/22 0929  TempSrc:   PainSc: 0-No pain         Complications: There were no known notable events for this encounter.

## 2022-11-10 NOTE — Anesthesia Preprocedure Evaluation (Signed)
Anesthesia Evaluation  Patient identified by MRN, date of birth, ID band Patient awake    Reviewed: Allergy & Precautions, NPO status , Patient's Chart, lab work & pertinent test results  History of Anesthesia Complications Negative for: history of anesthetic complications  Airway Mallampati: III  TM Distance: >3 FB     Dental  (+) Dental Advisory Given   Pulmonary neg shortness of breath, asthma (last needed inhaler 2 months ago) , neg sleep apnea, neg COPD, neg recent URI   Pulmonary exam normal breath sounds clear to auscultation       Cardiovascular (-) hypertension(-) angina (-) Past MI, (-) Cardiac Stents and (-) CABG negative cardio ROS (-) dysrhythmias  Rhythm:Regular Rate:Normal     Neuro/Psych  Headaches, Seizures - (on Lamictal and Keppra (both taken today); last seizure in January, meds adjusted),  PSYCHIATRIC DISORDERS (ADHD, conversion disorder) Anxiety Depression       GI/Hepatic Neg liver ROS,GERD  Medicated,,  Endo/Other  diabetes  Morbid obesity  Renal/GU negative Renal ROS     Musculoskeletal   Abdominal  (+) + obese  Peds  Hematology  (+) Blood dyscrasia, anemia   Anesthesia Other Findings   Reproductive/Obstetrics                              Anesthesia Physical Anesthesia Plan  ASA: 3  Anesthesia Plan:    Post-op Pain Management:    Induction: Intravenous  PONV Risk Score and Plan: 3 and Treatment may vary due to age or medical condition  Airway Management Planned:   Additional Equipment:   Intra-op Plan:   Post-operative Plan:   Informed Consent:   Plan Discussed with: CRNA and Anesthesiologist  Anesthesia Plan Comments:         Anesthesia Quick Evaluation

## 2022-11-10 NOTE — Op Note (Signed)
[facility] Patient Name: Latoya Stark Procedure Date: 11/10/2022 9:13 AM MRN: [id number] Account #: [id number] Date of Birth: 1995-03-06 Admit Type: Outpatient Age: 27 Room: [facility] ENDO ROOM 4 Gender: Female Note Status: Finalized Instrument Name: Patton Salles Endoscope [id number] Procedure:             Upper GI endoscopy Indications:           Dysphagia Providers:             Wyline Mood MD, MD Referring MD:          Rudolpho Sevin. Zane Herald (Referring MD) Medicines:             Monitored Anesthesia Care Complications:         No immediate complications. Procedure:             Pre-Anesthesia Assessment:                        - Prior to the procedure, a History and Physical was                         performed, and patient medications, allergies and                         sensitivities were reviewed. The patient's tolerance                         of previous anesthesia was reviewed.                        - The risks and benefits of the procedure and the                         sedation options and risks were discussed with the                         patient. All questions were answered and informed                         consent was obtained.                        - ASA Grade Assessment: II - A patient with mild                         systemic disease.                        After obtaining informed consent, the endoscope was                         passed under direct vision. Throughout the procedure,                         the patient's blood pressure, pulse, and oxygen                         saturations were monitored continuously. The                         Endosonoscope was introduced through  the mouth, and                         advanced to the third part of duodenum. The upper GI                         endoscopy was accomplished with ease. The patient                         tolerated the procedure well. Findings:      The examined duodenum  was normal.      The stomach was normal.      The cardia and gastric fundus were normal on retroflexion.      The examined esophagus was normal. Biopsies were taken with a cold       forceps for histology.      The stomach was normal.      A few small sessile polyps with no stigmata of recent bleeding were       found in the gastric fundus. Impression:            - Normal examined duodenum.                        - Normal stomach.                        - Normal esophagus. Biopsied. Recommendation:        - Await pathology results.                        - Discharge patient to home (with escort).                        - Resume previous diet.                        - Continue present medications.                        - Return to GI office as previously scheduled. Procedure Code(s):     --- Professional ---                        [id number], Esophagogastroduodenoscopy, flexible,                         transoral; with biopsy, single or multiple Diagnosis Code(s):     --- Professional ---                        R13.10, Dysphagia, unspecified CPT copyright 2022 American Medical Association. All rights reserved. The codes documented in this report are preliminary and upon coder review may  be revised to meet current compliance requirements. Wyline Mood, MD Wyline Mood MD, MD 11/10/2022 9:24:21 AM This report has been signed electronically. Number of Addenda: 0 Note Initiated On: 11/10/2022 9:13 AM Estimated Blood Loss:  Estimated blood loss: none.      [facility]

## 2022-11-13 ENCOUNTER — Encounter: Payer: Self-pay | Admitting: Surgery

## 2022-11-13 ENCOUNTER — Ambulatory Visit (INDEPENDENT_AMBULATORY_CARE_PROVIDER_SITE_OTHER): Payer: Medicaid Other | Admitting: Surgery

## 2022-11-13 VITALS — BP 128/87 | HR 103 | Temp 98.0°F | Ht 65.0 in | Wt 335.2 lb

## 2022-11-13 DIAGNOSIS — D170 Benign lipomatous neoplasm of skin and subcutaneous tissue of head, face and neck: Secondary | ICD-10-CM

## 2022-11-13 LAB — SURGICAL PATHOLOGY

## 2022-11-13 NOTE — Progress Notes (Signed)
Outpatient Surgical Follow Up  11/13/2022  Latoya Stark is an 27 y.o. female.   Chief Complaint  Patient presents with   Follow-up    Lipoma of neck    HPI:  Latoya Stark is a 27 y.o. female seen in f/u anterior neck mass.  She states that it has grown over time.  To give her some discomfort and compressive symptoms.  For some occasional dysphagia. Recent ultrasound that I have personally reviewed showing evidence of a 12 mm subcutaneous mass consistent with lipoma in the anterior neck. She also had CT that I have pers reviewed, no official report, large neck mass c/w lipoma, no sarcomatous features  BMI is 55.  She Does have a history of seizure disorder.  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   Headache    Obesity    Pneumonia    Postpartum depression, postpartum condition 08/11/2021   Seizures (HCC)    "when I get too hot"   Trichomonas infection    UTI (lower urinary tract infection)    Vulvar abscess 08/11/2021    Past Surgical History:  Procedure Laterality Date   ADENOIDECTOMY     DILATION AND EVACUATION N/A 08/09/2013   Procedure: DILATATION AND EVACUATION;  Surgeon: Kathreen Cosier, MD;  Location: [facility];  Service: Gynecology;  Laterality: N/A;   ESOPHAGOGASTRODUODENOSCOPY (EGD) WITH PROPOFOL N/A 11/10/2022   Procedure: ESOPHAGOGASTRODUODENOSCOPY (EGD) WITH PROPOFOL;  Surgeon: Wyline Mood, MD;  Location: [facility] ENDOSCOPY;  Service: Gastroenterology;  Laterality: N/A;   EUSTACHIAN TUBE DILATION     ORTHOPEDIC SURGERY Left    left hand   TONSILLECTOMY     UMBILICAL HERNIA REPAIR N/A 05/12/2022    Procedure: LAPAROSCOPIC ASSISTED SUPRA UMBILICAL HERNIA REPAIR;  Surgeon: Luretha Murphy, MD;  Location: [facility] ORS;  Service: General;  Laterality: N/A;    Family History  Problem Relation Age of Onset   Heart disease Mother    Cancer Mother        Bone cancer   Seizures Mother    Stroke Mother    Sickle cell anemia Mother    Hypertension Father    Seizures Maternal Aunt    Diabetes Maternal Grandmother    Colon cancer Maternal Grandmother    Hypertension Maternal Grandmother    Clotting disorder Maternal Grandmother    Breast cancer Maternal Grandmother    Stroke Maternal Grandmother    Diabetes Paternal Grandmother     Social History:  reports that she has never smoked. She has never used smokeless tobacco. She reports current alcohol use. She reports that she does not use drugs.  Allergies:  Allergies  Allergen Reactions   Hydrocodone Hives   Milk-Related Compounds Dermatitis    Breaks face out *can have 2% milk*   Mushroom Extract Complex Anaphylaxis   Penicillins Anaphylaxis    Has patient had a PCN reaction causing immediate rash, facial/tongue/throat swelling, SOB or lightheadedness with hypotension: Yes Has patient had a PCN reaction causing severe rash involving mucus membranes or skin necrosis: No Has patient had a PCN reaction that required hospitalization No Has  patient had a PCN reaction occurring within the last 10 years: Yes If all of the above answers are "NO", then may proceed with Cephalosporin use.    Cheese Other (See Comments)    Breaks face out   Latex Itching and Swelling   Sulfa Antibiotics Swelling    Medications reviewed.    ROS Full ROS performed and is otherwise negative other than what is stated in HPI   BP 128/87 (BP Location: Right Arm, Patient Position: Sitting, Cuff Size: Large)   Pulse (!) 103   Temp 98 F (36.7 C) (Oral)   Ht 5\' 5"  (1.651 m)   Wt (!) 335 lb 3.2 oz (152 kg)   LMP 10/04/2022 (Approximate)   SpO2 97%   BMI  55.78 kg/m   Physical Exam  CONSTITUTIONAL: NAD alert. EYES: Pupils are equal, round,  Sclera are non-icteric. EARS, NOSE, MOUTH AND THROAT: The oropharynx is clear. The oral mucosa is pink and moist. Hearing is intact to voice. LYMPH NODES:  Lymph nodes in the neck are normal. RESPIRATORY:  Lungs are clear. There is normal  Neck: There is a large, greater than 12 cm soft tissue mass on anterior aspect of the neck extending past the sternal notch.  This is mobile and is consistent with a lipoma.  Respiratory effort, with equal breath sounds bilaterally, and without pathologic use of accessory muscles. CARDIOVASCULAR: Heart is regular without murmurs, gallops, or rubs. GI: The abdomen is  soft, nontender, and nondistended. There are no palpable masses. There is no hepatosplenomegaly. There are normal bowel sounds in all quadrants. GU: Rectal deferred.   MUSCULOSKELETAL: Normal muscle strength and tone. No cyanosis or edema.   SKIN: Turgor is good and there are no pathologic skin lesions or ulcers. NEUROLOGIC: Motor and sensation is grossly normal. Cranial nerves are grossly intact. PSYCH:  Oriented to person, place and time. Affect is normal.   Assessment/Plan: 27 year old female with  anterior chest wall soft tissue mass extending into the neck.  This is consistent with a very large lipoma.  I do think she needs to be done in the OR ,  given its size and high risk airway. She discussed with the patient in detail.  Risk, benefits and possible medications including but not limited to: Bleeding, infection, spanning hematomas, airway issues, wound issues.  Also discussed with her the possibility of placing a JP given the size. She wishes to move forward with planned surgery.  All questions were answered. Please note that I spent 40 minutes in this encounter including personally reviewing imaging studies, medical records, coordinating her care, placing orders and performing appropriate  documentation  Sterling Big, MD [facility] General Surgeon

## 2022-11-13 NOTE — Patient Instructions (Signed)
Today we have removed a Lipoma in our office. Please see information below regarding this type of tumor.  You are free to shower in 48 hours. This will be on [place].  You have glue on your skin and sutures under the skin. The glue will come off on it's own in 10-14 days. You may shower normally until this occurs but do not submerge.  Please use Tylenol or Ibuprofen for pain as needed. You may use ice to the area 3-4 times today and tomorrow for any achiness.   We will see you back in 7-10 days to ensure that this has healed and to review the final pathology. Please see your appointment below. You may continue your regular activities right away but if you are having pain while doing something, stop what you are doing and try this activity once again in 3 days. Please call our office with any questions or concerns prior to your appointment.   Lipoma Removal Lipoma removal is a surgical procedure to remove a noncancerous (benign) tumor that is made up of fat cells (lipoma). Most lipomas are small and painless and do not require treatment. They can form in many areas of the body but are most common under the skin of the back, shoulders, arms, and thighs. You may need lipoma removal if you have a lipoma that is large, growing, or causing discomfort. Lipoma removal may also be done for cosmetic reasons. Tell a health care provider about: Any allergies you have. All medicines you are taking, including vitamins, herbs, eye drops, creams, and over-the-counter medicines. Any problems you or family members have had with anesthetic medicines. Any blood disorders you have. Any surgeries you have had. Any medical conditions you have. Whether you are pregnant or may be pregnant. What are the risks? Generally, this is a safe procedure. However, problems may occur, including: Infection. Bleeding. Allergic reactions to medicines. Damage to nerves or blood vessels near the lipoma. Scarring.  Medicines Ask  your health care provider about: Changing or stopping your regular medicines. This is especially important if you are taking diabetes medicines or blood thinners. Taking medicines such as aspirin and ibuprofen. These medicines can thin your blood. Do not take these medicines before your procedure if your health care provider instructs you not to. You may be given antibiotic medicine to help prevent infection. General instructions Ask your health care provider how your surgical site will be marked or identified. You will have a physical exam. Your health care provider will check the size of the lipoma and whether it can be moved easily.  What happens during the procedure? To reduce your risk of infection: Your health care team will wash or sanitize their hands. Your skin will be washed with surgical soap. You will be given the following: A medicine to numb the area (local anesthetic). An incision will be made over the lipoma or very near the lipoma. The incision may be made in a natural skin line or crease. Tissues, nerves, and blood vessels near the lipoma will be moved out of the way. The lipoma and the capsule that surrounds it will be separated from the surrounding tissues. The lipoma will be removed. The incision may be closed with stitches and surgical glue

## 2022-11-13 NOTE — H&P (View-Only) (Signed)
Outpatient Surgical Follow Up  11/13/2022  Latoya Stark is an 27 y.o. female.   Chief Complaint  Patient presents with   Follow-up    Lipoma of neck    HPI:  Latoya Stark is a 27 y.o. female seen in f/u anterior neck mass.  She states that it has grown over time.  To give her some discomfort and compressive symptoms.  For some occasional dysphagia. Recent ultrasound that I have personally reviewed showing evidence of a 12 mm subcutaneous mass consistent with lipoma in the anterior neck. She also had CT that I have pers reviewed, no official report, large neck mass c/w lipoma, no sarcomatous features  BMI is 55.  She Does have a history of seizure disorder.  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   Headache    Obesity    Pneumonia    Postpartum depression, postpartum condition 08/11/2021   Seizures (HCC)    "when I get too hot"   Trichomonas infection    UTI (lower urinary tract infection)    Vulvar abscess 08/11/2021    Past Surgical History:  Procedure Laterality Date   ADENOIDECTOMY     DILATION AND EVACUATION N/A 08/09/2013   Procedure: DILATATION AND EVACUATION;  Surgeon: Kathreen Cosier, MD;  Location: [facility];  Service: Gynecology;  Laterality: N/A;   ESOPHAGOGASTRODUODENOSCOPY (EGD) WITH PROPOFOL N/A 11/10/2022   Procedure: ESOPHAGOGASTRODUODENOSCOPY (EGD) WITH PROPOFOL;  Surgeon: Wyline Mood, MD;  Location: [facility] ENDOSCOPY;  Service: Gastroenterology;  Laterality: N/A;   EUSTACHIAN TUBE DILATION     ORTHOPEDIC SURGERY Left    left hand   TONSILLECTOMY     UMBILICAL HERNIA REPAIR N/A 05/12/2022    Procedure: LAPAROSCOPIC ASSISTED SUPRA UMBILICAL HERNIA REPAIR;  Surgeon: Luretha Murphy, MD;  Location: [facility];  Service: General;  Laterality: N/A;    Family History  Problem Relation Age of Onset   Heart disease Mother    Cancer Mother        Bone cancer   Seizures Mother    Stroke Mother    Sickle cell anemia Mother    Hypertension Father    Seizures Maternal Aunt    Diabetes Maternal Grandmother    Colon cancer Maternal Grandmother    Hypertension Maternal Grandmother    Clotting disorder Maternal Grandmother    Breast cancer Maternal Grandmother    Stroke Maternal Grandmother    Diabetes Paternal Grandmother     Social History:  reports that she has never smoked. She has never used smokeless tobacco. She reports current alcohol use. She reports that she does not use drugs.  Allergies:  Allergies  Allergen Reactions   Hydrocodone Hives   Milk-Related Compounds Dermatitis    Breaks face out *can have 2% milk*   Mushroom Extract Complex Anaphylaxis   Penicillins Anaphylaxis    Has patient had a PCN reaction causing immediate rash, facial/tongue/throat swelling, SOB or lightheadedness with hypotension: Yes Has patient had a PCN reaction causing severe rash involving mucus membranes or skin necrosis: No Has patient had a PCN reaction that required hospitalization No Has  patient had a PCN reaction occurring within the last 10 years: Yes If all of the above answers are "NO", then may proceed with Cephalosporin use.    Cheese Other (See Comments)    Breaks face out   Latex Itching and Swelling   Sulfa Antibiotics Swelling    Medications reviewed.    ROS Full ROS performed and is otherwise negative other than what is stated in HPI   BP 128/87 (BP Location: Right Arm, Patient Position: Sitting, Cuff Size: Large)   Pulse (!) 103   Temp 98 F (36.7 C) (Oral)   Ht 5\' 5"  (1.651 m)   Wt (!) 335 lb 3.2 oz (152 kg)   LMP 10/04/2022 (Approximate)   SpO2 97%   BMI  55.78 kg/m   Physical Exam  CONSTITUTIONAL: NAD alert. EYES: Pupils are equal, round,  Sclera are non-icteric. EARS, NOSE, MOUTH AND THROAT: The oropharynx is clear. The oral mucosa is pink and moist. Hearing is intact to voice. LYMPH NODES:  Lymph nodes in the neck are normal. RESPIRATORY:  Lungs are clear. There is normal  Neck: There is a large, greater than 12 cm soft tissue mass on anterior aspect of the neck extending past the sternal notch.  This is mobile and is consistent with a lipoma.  Respiratory effort, with equal breath sounds bilaterally, and without pathologic use of accessory muscles. CARDIOVASCULAR: Heart is regular without murmurs, gallops, or rubs. GI: The abdomen is  soft, nontender, and nondistended. There are no palpable masses. There is no hepatosplenomegaly. There are normal bowel sounds in all quadrants. GU: Rectal deferred.   MUSCULOSKELETAL: Normal muscle strength and tone. No cyanosis or edema.   SKIN: Turgor is good and there are no pathologic skin lesions or ulcers. NEUROLOGIC: Motor and sensation is grossly normal. Cranial nerves are grossly intact. PSYCH:  Oriented to person, place and time. Affect is normal.   Assessment/Plan: 27 year old female with  anterior chest wall soft tissue mass extending into the neck.  This is consistent with a very large lipoma.  I do think she needs to be done in the OR ,  given its size and high risk airway. She discussed with the patient in detail.  Risk, benefits and possible medications including but not limited to: Bleeding, infection, spanning hematomas, airway issues, wound issues.  Also discussed with her the possibility of placing a JP given the size. She wishes to move forward with planned surgery.  All questions were answered. Please note that I spent 40 minutes in this encounter including personally reviewing imaging studies, medical records, coordinating her care, placing orders and performing appropriate  documentation  Sterling Big, MD [facility] General Surgeon

## 2022-11-14 ENCOUNTER — Encounter
Admission: RE | Admit: 2022-11-14 | Discharge: 2022-11-14 | Disposition: A | Discharge status: Home / Self Care | Payer: Medicaid Other | Source: Ambulatory Visit | Attending: Surgery | Admitting: Surgery

## 2022-11-14 ENCOUNTER — Other Ambulatory Visit: Payer: Self-pay

## 2022-11-14 VITALS — Ht 65.0 in | Wt 335.0 lb

## 2022-11-14 DIAGNOSIS — Z01812 Encounter for preprocedural laboratory examination: Secondary | ICD-10-CM

## 2022-11-14 DIAGNOSIS — D649 Anemia, unspecified: Secondary | ICD-10-CM

## 2022-11-14 DIAGNOSIS — Z01818 Encounter for other preprocedural examination: Secondary | ICD-10-CM | POA: Insufficient documentation

## 2022-11-14 DIAGNOSIS — D170 Benign lipomatous neoplasm of skin and subcutaneous tissue of head, face and neck: Secondary | ICD-10-CM | POA: Insufficient documentation

## 2022-11-14 DIAGNOSIS — Z01810 Encounter for preprocedural cardiovascular examination: Secondary | ICD-10-CM | POA: Diagnosis not present

## 2022-11-14 DIAGNOSIS — Z6841 Body Mass Index (BMI) 40.0 and over, adult: Secondary | ICD-10-CM

## 2022-11-14 HISTORY — DX: Family history of other specified conditions: Z84.89

## 2022-11-14 LAB — CBC
HCT: 34.5 % — ABNORMAL LOW (ref 36.0–46.0)
Hemoglobin: 11.7 g/dL — ABNORMAL LOW (ref 12.0–15.0)
MCH: 28.6 pg (ref 26.0–34.0)
MCHC: 33.9 g/dL (ref 30.0–36.0)
MCV: 84.4 fL (ref 80.0–100.0)
Platelets: 418 10*3/uL — ABNORMAL HIGH (ref 150–400)
RBC: 4.09 MIL/uL (ref 3.87–5.11)
RDW: 13.2 % (ref 11.5–15.5)
WBC: 7.1 10*3/uL (ref 4.0–10.5)
nRBC: 0 % (ref 0.0–0.2)

## 2022-11-14 LAB — BASIC METABOLIC PANEL
Anion gap: 8 (ref 5–15)
BUN: 12 mg/dL (ref 6–20)
CO2: 23 mmol/L (ref 22–32)
Calcium: 9 mg/dL (ref 8.9–10.3)
Chloride: 106 mmol/L (ref 98–111)
Creatinine, Ser: 0.64 mg/dL (ref 0.44–1.00)
GFR, Estimated: >60 mL/min (ref >60)
Glucose, Bld: 101 mg/dL — ABNORMAL HIGH (ref 70–99)
Potassium: 3.8 mmol/L (ref 3.5–5.1)
Sodium: 137 mmol/L (ref 135–145)

## 2022-11-14 NOTE — Patient Instructions (Addendum)
Your procedure is scheduled on: Thursday, November 7 Report to the Registration Desk on the 1st floor of the [facility] To find out your arrival time, please call [phone] between 1PM - 3PM on: Wednesday, November 6 If your arrival time is 6:00 am, do not arrive before that time as the [facility] entrance doors do not open until 6:00 am.  REMEMBER: Instructions that are not followed completely may result in serious medical risk, up to and including death; or upon the discretion of your surgeon and anesthesiologist your surgery may need to be rescheduled.  Do not eat food after midnight the night before surgery.  No gum chewing or hard candies.  You may however, drink CLEAR liquids up to 2 hours before you are scheduled to arrive for your surgery. Do not drink anything within 2 hours of your scheduled arrival time.  Clear liquids include: - water  - apple juice without pulp - gatorade (not RED colors) - black coffee or tea (Do NOT add milk or creamers to the coffee or tea) Do NOT drink anything that is not on this list.  One week prior to surgery: starting now Stop Anti-inflammatories (NSAIDS) such as Advil, Aleve, Ibuprofen, Motrin, Naproxen, Naprosyn and Aspirin based products such as Excedrin, Goody's Powder, BC Powder. Stop ANY OVER THE COUNTER supplements until after surgery.  You may however, continue to take Tylenol if needed for pain up until the day of surgery.  Continue taking all of your other prescription medications up until the day of surgery.  ON THE DAY OF SURGERY ONLY TAKE THESE MEDICATIONS WITH SIPS OF WATER:  Lamictal Keppra Pantoprazole Symbicort inhaler  Use inhalers on the day of surgery and bring your albuterol inhaler to the hospital.  No Alcohol for 24 hours before or after surgery.  No Smoking including e-cigarettes for 24 hours before surgery.  No chewable tobacco products for at least 6 hours before surgery.  No nicotine patches on the  day of surgery.  Do not use any "recreational" drugs for at least a week (preferably 2 weeks) before your surgery.  Please be advised that the combination of cocaine and anesthesia may have negative outcomes, up to and including death. If you test positive for cocaine, your surgery will be cancelled.  On the morning of surgery brush your teeth with toothpaste and water, you may rinse your mouth with mouthwash if you wish. Do not swallow any toothpaste or mouthwash.  Use CHG Soap as directed on instruction sheet.  Do not wear jewelry, make-up, hairpins, clips or nail polish.  For welded (permanent) jewelry: bracelets, anklets, waist bands, etc.  Please have this removed prior to surgery.  If it is not removed, there is a chance that hospital personnel will need to cut it off on the day of surgery.  Do not wear lotions, powders, or perfumes.   Do not shave body hair from the neck down 48 hours before surgery.  Contact lenses, hearing aids and dentures may not be worn into surgery.  Do not bring valuables to the hospital. [facility] is not responsible for any missing/lost belongings or valuables.   Notify your doctor if there is any change in your medical condition (cold, fever, infection).  Wear comfortable clothing (specific to your surgery type) to the hospital.  After surgery, you can help prevent lung complications by doing breathing exercises.  Take deep breaths and cough every 1-2 hours.   If you are being discharged the day of surgery,  you will not be allowed to drive home. You will need a responsible individual to drive you home and stay with you for 24 hours after surgery.   If you are taking public transportation, you will need to have a responsible individual with you.  Please call the Pre-admissions Testing Dept. at [phone] if you have any questions about these instructions.  Surgery Visitation Policy:  Patients having surgery or a procedure may have two  visitors.  Children under the age of 68 must have an adult with them who is not the patient.     Preparing for Surgery with CHLORHEXIDINE GLUCONATE (CHG) Soap  Chlorhexidine Gluconate (CHG) Soap  o An antiseptic cleaner that kills germs and bonds with the skin to continue killing germs even after washing  o Used for showering the night before surgery and morning of surgery  Before surgery, you can play an important role by reducing the number of germs on your skin.  CHG (Chlorhexidine gluconate) soap is an antiseptic cleanser which kills germs and bonds with the skin to continue killing germs even after washing.  Please do not use if you have an allergy to CHG or antibacterial soaps. If your skin becomes reddened/irritated stop using the CHG.  1. Shower the NIGHT BEFORE SURGERY and the MORNING OF SURGERY with CHG soap.  2. If you choose to wash your hair, wash your hair first as usual with your normal shampoo.  3. After shampooing, rinse your hair and body thoroughly to remove the shampoo.  4. Use CHG as you would any other liquid soap. You can apply CHG directly to the skin and wash gently with a scrungie or a clean washcloth.  5. Apply the CHG soap to your body only from the neck down. Do not use on open wounds or open sores. Avoid contact with your eyes, ears, mouth, and genitals (private parts). Wash face and genitals (private parts) with your normal soap.  6. Wash thoroughly, paying special attention to the area where your surgery will be performed.  7. Thoroughly rinse your body with warm water.  8. Do not shower/wash with your normal soap after using and rinsing off the CHG soap.  9. Pat yourself dry with a clean towel.  10. Wear clean pajamas to bed the night before surgery.  12. Place clean sheets on your bed the night of your first shower and do not sleep with pets.  13. Shower again with the CHG soap on the day of surgery prior to arriving at the hospital.  14.  Do not apply any deodorants/lotions/powders.  15. Please wear clean clothes to the hospital.

## 2022-11-15 MED ORDER — CHLORHEXIDINE GLUCONATE CLOTH 2 % EX PADS
6.0000 | MEDICATED_PAD | Freq: Once | CUTANEOUS | Status: AC
  Administered 2022-11-16: 6 via TOPICAL

## 2022-11-15 MED ORDER — CEFAZOLIN IN SODIUM CHLORIDE 3-0.9 GM/100ML-% IV SOLN
3.0000 g | INTRAVENOUS | Status: AC
  Administered 2022-11-16: 3 g via INTRAVENOUS
  Filled 2022-11-15: qty 100

## 2022-11-15 MED ORDER — CHLORHEXIDINE GLUCONATE CLOTH 2 % EX PADS
6.0000 | MEDICATED_PAD | Freq: Once | CUTANEOUS | Status: AC
  Administered 2022-11-15: 6 via TOPICAL

## 2022-11-15 MED ORDER — GABAPENTIN 300 MG PO CAPS
300.0000 mg | ORAL_CAPSULE | ORAL | Status: AC
  Administered 2022-11-16: 300 mg via ORAL

## 2022-11-15 MED ORDER — ORAL CARE MOUTH RINSE: 15.0000 mL | Freq: Once | OROMUCOSAL | Status: AC

## 2022-11-15 MED ORDER — ACETAMINOPHEN 500 MG PO TABS
1000.0000 mg | ORAL_TABLET | ORAL | Status: AC
Start: 2022-11-16 — End: 2022-11-17
  Administered 2022-11-16: 1000 mg via ORAL

## 2022-11-15 MED ORDER — LACTATED RINGERS IV SOLN: INTRAVENOUS | Status: DC

## 2022-11-15 MED ORDER — CHLORHEXIDINE GLUCONATE 0.12 % MT SOLN
15.0000 mL | Freq: Once | OROMUCOSAL | Status: AC
  Administered 2022-11-16: 15 mL via OROMUCOSAL

## 2022-11-16 ENCOUNTER — Encounter: Payer: Self-pay | Admitting: Surgery

## 2022-11-16 ENCOUNTER — Encounter
Admission: RE | Disposition: A | Discharge status: Home / Self Care | Payer: Self-pay | Source: Home / Self Care | Attending: Surgery

## 2022-11-16 ENCOUNTER — Ambulatory Visit: Payer: Self-pay | Admitting: Urgent Care

## 2022-11-16 ENCOUNTER — Other Ambulatory Visit: Payer: Self-pay

## 2022-11-16 ENCOUNTER — Ambulatory Visit
Admission: RE | Admit: 2022-11-16 | Discharge: 2022-11-16 | Disposition: A | Discharge status: Home / Self Care | Payer: Medicaid Other | Attending: Surgery | Admitting: Surgery

## 2022-11-16 DIAGNOSIS — D170 Benign lipomatous neoplasm of skin and subcutaneous tissue of head, face and neck: Secondary | ICD-10-CM | POA: Insufficient documentation

## 2022-11-16 DIAGNOSIS — K219 Gastro-esophageal reflux disease without esophagitis: Secondary | ICD-10-CM | POA: Insufficient documentation

## 2022-11-16 DIAGNOSIS — Z01810 Encounter for preprocedural cardiovascular examination: Secondary | ICD-10-CM

## 2022-11-16 DIAGNOSIS — G40409 Other generalized epilepsy and epileptic syndromes, not intractable, without status epilepticus: Secondary | ICD-10-CM | POA: Insufficient documentation

## 2022-11-16 DIAGNOSIS — Z01812 Encounter for preprocedural laboratory examination: Secondary | ICD-10-CM

## 2022-11-16 DIAGNOSIS — J45909 Unspecified asthma, uncomplicated: Secondary | ICD-10-CM | POA: Diagnosis not present

## 2022-11-16 DIAGNOSIS — Z6841 Body Mass Index (BMI) 40.0 and over, adult: Secondary | ICD-10-CM | POA: Insufficient documentation

## 2022-11-16 DIAGNOSIS — D649 Anemia, unspecified: Secondary | ICD-10-CM

## 2022-11-16 DIAGNOSIS — R221 Localized swelling, mass and lump, neck: Secondary | ICD-10-CM | POA: Diagnosis present

## 2022-11-16 HISTORY — PX: EXCISION MASS NECK: SHX6703

## 2022-11-16 LAB — POCT PREGNANCY, URINE: Preg Test, Ur: NEGATIVE

## 2022-11-16 SURGERY — EXCISION, MASS, NECK
Anesthesia: General | Site: Neck

## 2022-11-16 MED ORDER — ACETAMINOPHEN 500 MG PO TABS
ORAL_TABLET | ORAL | Status: AC
  Filled 2022-11-16: qty 2

## 2022-11-16 MED ORDER — PROPOFOL 10 MG/ML IV BOLUS
INTRAVENOUS | Status: AC
  Filled 2022-11-16: qty 20

## 2022-11-16 MED ORDER — MIDAZOLAM HCL 2 MG/2ML IJ SOLN
INTRAMUSCULAR | Status: DC | PRN
  Administered 2022-11-16: 2 mg via INTRAVENOUS

## 2022-11-16 MED ORDER — ONDANSETRON HCL 4 MG/2ML IJ SOLN: 4.0000 mg | Freq: Once | INTRAMUSCULAR | Status: DC | PRN

## 2022-11-16 MED ORDER — PROPOFOL 10 MG/ML IV BOLUS
INTRAVENOUS | Status: DC | PRN
  Administered 2022-11-16: 200 mg via INTRAVENOUS

## 2022-11-16 MED ORDER — CHLORHEXIDINE GLUCONATE 0.12 % MT SOLN
OROMUCOSAL | Status: AC
  Filled 2022-11-16: qty 15

## 2022-11-16 MED ORDER — FENTANYL CITRATE (PF) 100 MCG/2ML IJ SOLN
INTRAMUSCULAR | Status: AC
  Filled 2022-11-16: qty 2

## 2022-11-16 MED ORDER — OXYCODONE HCL 5 MG PO TABS
ORAL_TABLET | ORAL | Status: AC
  Filled 2022-11-16: qty 1

## 2022-11-16 MED ORDER — KETOROLAC TROMETHAMINE 30 MG/ML IJ SOLN
INTRAMUSCULAR | Status: DC | PRN
  Administered 2022-11-16: 30 mg via INTRAVENOUS

## 2022-11-16 MED ORDER — SUGAMMADEX SODIUM 200 MG/2ML IV SOLN
INTRAVENOUS | Status: DC | PRN
  Administered 2022-11-16: 200 mg via INTRAVENOUS

## 2022-11-16 MED ORDER — STERILE WATER FOR IRRIGATION IR SOLN
Status: DC | PRN
  Administered 2022-11-16: 1

## 2022-11-16 MED ORDER — DEXAMETHASONE SODIUM PHOSPHATE 10 MG/ML IJ SOLN
INTRAMUSCULAR | Status: DC | PRN
  Administered 2022-11-16: 5 mg via INTRAVENOUS

## 2022-11-16 MED ORDER — 0.9 % SODIUM CHLORIDE (POUR BTL) OPTIME
TOPICAL | Status: DC | PRN
  Administered 2022-11-16: 500 mL

## 2022-11-16 MED ORDER — ACETAMINOPHEN 10 MG/ML IV SOLN: 1000.0000 mg | Freq: Once | INTRAVENOUS | Status: DC | PRN

## 2022-11-16 MED ORDER — MIDAZOLAM HCL 2 MG/2ML IJ SOLN
INTRAMUSCULAR | Status: AC
Start: 2022-11-16 — End: ?
  Filled 2022-11-16: qty 2

## 2022-11-16 MED ORDER — FENTANYL CITRATE (PF) 100 MCG/2ML IJ SOLN
25.0000 ug | INTRAMUSCULAR | Status: DC | PRN
  Administered 2022-11-16 (×4): 25 ug via INTRAVENOUS
  Administered 2022-11-16: 50 ug via INTRAVENOUS

## 2022-11-16 MED ORDER — OXYCODONE HCL 5 MG PO TABS
5.0000 mg | ORAL_TABLET | Freq: Once | ORAL | Status: AC | PRN
  Administered 2022-11-16: 5 mg via ORAL

## 2022-11-16 MED ORDER — LIDOCAINE HCL (CARDIAC) PF 100 MG/5ML IV SOSY
PREFILLED_SYRINGE | INTRAVENOUS | Status: DC | PRN
  Administered 2022-11-16: 60 mg via INTRAVENOUS

## 2022-11-16 MED ORDER — ROCURONIUM BROMIDE 100 MG/10ML IV SOLN
INTRAVENOUS | Status: DC | PRN
  Administered 2022-11-16: 50 mg via INTRAVENOUS
  Administered 2022-11-16: 20 mg via INTRAVENOUS

## 2022-11-16 MED ORDER — LACTATED RINGERS IV SOLN: INTRAVENOUS | Status: DC | PRN

## 2022-11-16 MED ORDER — PHENYLEPHRINE 80 MCG/ML (10ML) SYRINGE FOR IV PUSH (FOR BLOOD PRESSURE SUPPORT)
PREFILLED_SYRINGE | INTRAVENOUS | Status: DC | PRN
  Administered 2022-11-16: 120 ug via INTRAVENOUS

## 2022-11-16 MED ORDER — BUPIVACAINE-EPINEPHRINE (PF) 0.25% -1:200000 IJ SOLN
INTRAMUSCULAR | Status: AC
  Filled 2022-11-16: qty 30

## 2022-11-16 MED ORDER — GABAPENTIN 300 MG PO CAPS
ORAL_CAPSULE | ORAL | Status: AC
  Filled 2022-11-16: qty 1

## 2022-11-16 MED ORDER — OXYCODONE HCL 5 MG/5ML PO SOLN: 5.0000 mg | Freq: Once | ORAL | Status: AC | PRN

## 2022-11-16 MED ORDER — LACTATED RINGERS IV SOLN: INTRAVENOUS | Status: AC

## 2022-11-16 MED ORDER — DEXMEDETOMIDINE HCL IN NACL 80 MCG/20ML IV SOLN
INTRAVENOUS | Status: DC | PRN
  Administered 2022-11-16 (×2): 8 ug via INTRAVENOUS

## 2022-11-16 MED ORDER — FENTANYL CITRATE (PF) 100 MCG/2ML IJ SOLN
INTRAMUSCULAR | Status: DC | PRN
  Administered 2022-11-16 (×4): 50 ug via INTRAVENOUS

## 2022-11-16 MED ORDER — OXYCODONE HCL 5 MG PO TABS: 5.0000 mg | ORAL_TABLET | Freq: Four times a day (QID) | ORAL | 0 refills | Status: DC | PRN

## 2022-11-16 MED ORDER — ONDANSETRON HCL 4 MG/2ML IJ SOLN
INTRAMUSCULAR | Status: DC | PRN
  Administered 2022-11-16: 4 mg via INTRAVENOUS

## 2022-11-16 SURGICAL SUPPLY — 39 items
ADH SKN CLS APL DERMABOND .7 (GAUZE/BANDAGES/DRESSINGS) ×1
APL PRP STRL LF DISP 70% ISPRP (MISCELLANEOUS) ×1
APPLIER CLIP 11 MED OPEN (CLIP) ×1
APR CLP MED 11 20 MLT OPN (CLIP) ×1
BLADE CLIPPER SURG (BLADE) ×2 IMPLANT
CHLORAPREP W/TINT 26 (MISCELLANEOUS) ×2 IMPLANT
CLIP APPLIE 11 MED OPEN (CLIP) ×2 IMPLANT
DERMABOND ADVANCED .7 DNX12 (GAUZE/BANDAGES/DRESSINGS) IMPLANT
DRAIN CHANNEL JP 15F RND 16 (MISCELLANEOUS) IMPLANT
DRAPE HALF SHEET 40X57 (DRAPES) IMPLANT
DRAPE LAPAROTOMY 100X77 ABD (DRAPES) ×2 IMPLANT
ELECT REM PT RETURN 9FT ADLT (ELECTROSURGICAL) ×1
ELECTRODE REM PT RTRN 9FT ADLT (ELECTROSURGICAL) ×2 IMPLANT
EVACUATOR SILICONE 100CC (DRAIN) IMPLANT
GAUZE 4X4 16PLY ~~LOC~~+RFID DBL (SPONGE) ×2 IMPLANT
GLOVE BIO SURGEON STRL SZ7 (GLOVE) ×2 IMPLANT
GOWN STRL REUS W/ TWL LRG LVL3 (GOWN DISPOSABLE) ×4 IMPLANT
GOWN STRL REUS W/TWL LRG LVL3 (GOWN DISPOSABLE) ×3
MANIFOLD NEPTUNE II (INSTRUMENTS) ×2 IMPLANT
NDL HYPO 22X1.5 SAFETY MO (MISCELLANEOUS) ×2 IMPLANT
NEEDLE HYPO 22X1.5 SAFETY MO (MISCELLANEOUS) ×1
PACK BASIN MINOR ARMC (MISCELLANEOUS) ×2 IMPLANT
PACK HEAD/NECK (MISCELLANEOUS) IMPLANT
SPONGE DRAIN TRACH 4X4 STRL 2S (GAUZE/BANDAGES/DRESSINGS) IMPLANT
SPONGE KITTNER 5P (MISCELLANEOUS) ×2 IMPLANT
SPONGE T-LAP 18X18 ~~LOC~~+RFID (SPONGE) ×2 IMPLANT
SUT ETHILON 3-0 FS-10 30 BLK (SUTURE) ×1
SUT MNCRL 4-0 (SUTURE) ×1
SUT MNCRL 4-0 27XMFL (SUTURE) ×1
SUT PLAIN 3 0 SH 27IN (SUTURE) ×2 IMPLANT
SUT SILK 3 0 (SUTURE) ×1
SUT SILK 3-0 18XBRD TIE 12 (SUTURE) IMPLANT
SUT VIC AB 2-0 SH 27 (SUTURE) ×2
SUT VIC AB 2-0 SH 27XBRD (SUTURE) IMPLANT
SUTURE EHLN 3-0 FS-10 30 BLK (SUTURE) IMPLANT
SUTURE MNCRL 4-0 27XMF (SUTURE) IMPLANT
SYR 10ML LL (SYRINGE) ×2 IMPLANT
TRAP FLUID SMOKE EVACUATOR (MISCELLANEOUS) ×2 IMPLANT
WATER STERILE IRR 500ML POUR (IV SOLUTION) ×2 IMPLANT

## 2022-11-16 NOTE — Anesthesia Preprocedure Evaluation (Addendum)
Anesthesia Evaluation  Patient identified by MRN, date of birth, ID band Patient awake    Reviewed: Allergy & Precautions, NPO status , Patient's Chart, lab work & pertinent test results  History of Anesthesia Complications Negative for: history of anesthetic complications  Airway Mallampati: I   Neck ROM: Full    Dental  (+) Missing, Chipped   Pulmonary asthma    Pulmonary exam normal breath sounds clear to auscultation       Cardiovascular Normal cardiovascular exam Rhythm:Regular Rate:Normal  ECG 11/14/22: normal   Neuro/Psych  Headaches, Seizures -,  PSYCHIATRIC DISORDERS (ADHD, conversion disorder) Anxiety Depression       GI/Hepatic ,GERD  ,,  Endo/Other  Class 3 obesity  Renal/GU negative Renal ROS     Musculoskeletal   Abdominal   Peds  Hematology  (+) Blood dyscrasia, anemia   Anesthesia Other Findings   Reproductive/Obstetrics                             Anesthesia Physical Anesthesia Plan  ASA: 3  Anesthesia Plan: General   Post-op Pain Management:    Induction: Intravenous  PONV Risk Score and Plan: 3 and Ondansetron, Dexamethasone and Treatment may vary due to age or medical condition  Airway Management Planned: Oral ETT  Additional Equipment:   Intra-op Plan:   Post-operative Plan: Extubation in OR  Informed Consent: I have reviewed the patients History and Physical, chart, labs and discussed the procedure including the risks, benefits and alternatives for the proposed anesthesia with the patient or authorized representative who has indicated his/her understanding and acceptance.     Dental advisory given  Plan Discussed with: CRNA  Anesthesia Plan Comments: (Patient consented for risks of anesthesia including but not limited to:  - adverse reactions to medications - damage to eyes, teeth, lips or other oral mucosa - nerve damage due to positioning  -  sore throat or hoarseness - damage to heart, brain, nerves, lungs, other parts of body or loss of life  Informed patient about role of CRNA in peri- and intra-operative care.  Patient voiced understanding.)        Anesthesia Quick Evaluation

## 2022-11-16 NOTE — Anesthesia Postprocedure Evaluation (Signed)
Anesthesia Post Note  Patient: Latoya Stark  Procedure(s) Performed: EXCISION MASS NECK (Neck)  Patient location during evaluation: PACU Anesthesia Type: General Level of consciousness: awake and alert, oriented and patient cooperative Pain management: pain level controlled Vital Signs Assessment: post-procedure vital signs reviewed and stable Respiratory status: spontaneous breathing, nonlabored ventilation and respiratory function stable Cardiovascular status: blood pressure returned to baseline and stable Postop Assessment: adequate PO intake Anesthetic complications: no   No notable events documented.   Last Vitals:  Vitals:   11/16/22 1130 11/16/22 1135  BP: (!) 134/96 138/87  Pulse: 84 73  Resp: 16 18  Temp:  36.6 C  SpO2: 96% 97%    Last Pain:  Vitals:   11/16/22 1135  TempSrc: Temporal  PainSc: 4                  Reed Breech

## 2022-11-16 NOTE — Transfer of Care (Signed)
Immediate Anesthesia Transfer of Care Note  Patient: Latoya Stark  Procedure(s) Performed: EXCISION MASS NECK (Neck)  Patient Location: PACU  Anesthesia Type:General  Level of Consciousness: drowsy  Airway & Oxygen Therapy: Patient Spontanous Breathing and Patient connected to face mask oxygen  Post-op Assessment: Report given to RN and Post -op Vital signs reviewed and stable  Post vital signs: Reviewed and stable  Last Vitals:  Vitals Value Taken Time  BP 124/95 11/16/22 1046  Temp 36.8 C 11/16/22 1046  Pulse 85 11/16/22 1050  Resp 26 11/16/22 1050  SpO2 100 % 11/16/22 1050  Vitals shown include unfiled device data.  Last Pain:  Vitals:   11/16/22 1046  TempSrc:   PainSc: Asleep         Complications: No notable events documented.

## 2022-11-16 NOTE — Interval H&P Note (Signed)
History and Physical Interval Note:  11/16/2022 8:36 AM  Latoya Stark  has presented today for surgery, with the diagnosis of neck mass.  The various methods of treatment have been discussed with the patient and family. After consideration of risks, benefits and other options for treatment, the patient has consented to  Procedure(s): EXCISION MASS NECK (N/A) as a surgical intervention.  The patient's history has been reviewed, patient examined, no change in status, stable for surgery.  I have reviewed the patient's chart and labs.  Questions were answered to the patient's satisfaction.     Shavy Beachem F Dehaven Sine

## 2022-11-16 NOTE — Discharge Instructions (Addendum)
Open Neck excision, Care After The following information offers guidance on how to care for yourself after your procedure. Your health care provider may also give you more specific instructions. If you have problems or questions, contact your health care provider. What can I expect after the procedure? After the procedure, it is common to have: Mild pain in the neck or upper body, especially when swallowing. A sore throat. A weak voice. A swollen neck. Follow these instructions at home: Medicines Take over-the-counter and prescription medicines only as told by your health care provider. If you were prescribed an antibiotic medicine, take it as told by your health care provider. Do not stop using the antibiotic even if you start to feel better. Do not take medicines that contain aspirin and ibuprofen until your health care provider says that you can. These medicines can increase your risk of bleeding. Ask your health care provider if the medicine prescribed to you: Requires you to avoid driving or using machinery. Can cause constipation. You may need to take these actions to prevent or treat constipation: Drink enough fluid to keep your urine pale yellow. Take over-the-counter or prescription medicines. Eat foods that are high in fiber, such as beans, whole grains, and fresh fruits and vegetables. Limit foods that are high in fat and processed sugars, such as fried or sweet foods. Incision care Follow instructions from your health care provider about how to take care of your incision. Make sure you: Wash your hands with soap and water for at least 20 seconds before and after you change your bandage (dressing). If soap and water are not available, use hand sanitizer. Change your dressing as told by your health care provider. Leave stitches (sutures), skin glue, or adhesive strips in place. These skin closures may need to stay in place for 2 weeks or longer. If adhesive strip edges start to  loosen and curl up, you may trim the loose edges. Do not remove adhesive strips completely unless your health care provider tells you to do that. Check your incision area every day for signs of infection. Check for: Redness, swelling, or pain. Fluid or blood. Warmth. Pus or a bad smell. If you were sent home with a surgical drain in place, follow instructions from your health care provider about: How to empty it. How to care for it at home. Activity  You may have to avoid lifting. Ask your health care provider how much you can safely lift. Do not jog, swim, or do other activities that take a lot of effort until your health care provider says that it is safe. Return to your normal activities as told by your health care provider. Ask your health care provider what activities are safe for you. General instructions Follow instructions from your health care provider about eating or drinking restrictions. You may need to have only liquids and soft foods for a few days after the procedure. Do not use any products that contain nicotine or tobacco. These products include cigarettes, chewing tobacco, and vaping devices, such as e-cigarettes. These can delay incision healing after surgery. If you need help quitting, ask your health care provider. Do not take baths, swim, or use a hot tub until your health care provider approves. Ask your health care provider if you may take showers. You may only be allowed to take sponge baths. Keep all follow-up visits. This is important. Contact a health care provider if: The soreness in your throat gets worse or you have trouble swallowing. Your  incision shows signs of infection. You develop a rash. You have a fever. You develop a cough that gets worse. Your speech changes, or you have hoarseness that gets worse. You have numbness, tingling, or muscle spasms in your arms, hands, feet, or face. Get help right away if: You have difficulty breathing. You hear  whistling noises coming from your chest. You feel light-headed, or you faint. These symptoms may be an emergency. Get help right away. Call 911. Do not wait to see if the symptoms will go away. Do not drive yourself to the hospital. Summary After the procedure, it is common to have a sore throat, weak voice, swollen neck, and mild pain in your neck or upper body. Take over-the-counter and prescription medicines only as told by your health care provider. Follow instructions from your health care provider about eating and drinking. You may need to have only liquids and soft foods for a few days after the procedure. Make sure you know the signs of a possible problem and when to contact your health care provider. This information is not intended to replace advice given to you by your health care provider. Make sure you discuss any questions you have with your health care provider. Document Revised: 11/08/2020 Document Reviewed: 11/08/2020 Elsevier Patient Education  2024 [employer]     LAST DOSE OF TYLENOL WAS 830 AM

## 2022-11-16 NOTE — Op Note (Signed)
PROCEDURES: Excision of anterior neck mass measuring 13 cm submuscular  Pre-operative Diagnosis:Neck mass, Super morbid obesity, high risk airway  Post-operative Diagnosis: same  Surgeon: Merri Ray Aleaya Latona    Anesthesia: General endotracheal anesthesia  ASA Class: 3  Surgeon: Sterling Big , MD FACS  Anesthesia: Gen. with endotracheal tube  Assistant:  Findings: anterior neck mass measuring 13 cm submuscular complex and causing some airway compression  Estimated Blood Loss: 5cc         Drains: 15 FR         Specimens: neck mass          Complications: none                Condition: stable  Procedure Details  The patient was seen again in the Holding Room. The benefits, complications, treatment options, and expected outcomes were discussed with the patient. The risks of bleeding, infection, recurrence of symptoms, failure to resolve symptoms, nerve injury, any of which could require further surgery were reviewed with the patient.   The patient was taken to Operating Room, identified as Latoya Stark and the procedure verified.  A Time Out was held and the above information confirmed.  Prior to the induction of general anesthesia, antibiotic prophylaxis was administered. VTE prophylaxis was in place. General endotracheal anesthesia was then administered and tolerated well. After the induction, I retracted her bilateral breast to get better exposure to the neck.  It is noted that her area was difficult because of the neck mass and very short neck and a high BMI. The neck and chest were prepped with Chloraprep and draped in the sterile fashion. The patient was positioned in the supine position.  Appropriate safety measures for a high risk airway were taken including 2 large bore IVs, definitive airway.  The left arm was tucked and padded appropriately.  Transverse incision was created along the neck.  Electrocautery was used to dissect through subcutaneous tissue and the platysma  was incised in the standard fashion.  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  Close attention was taking and we were able to visualize the anterior jugular vein and clipped it and divided in the standard fashion.  We avoided any injuries to recurrent laryngeal nerve or any major vessels.  Specimen was removed and sent for permanent pathology.  15 Blake drain was placed in a subplatysmal fashion.  We asked anesthesia to perform a good Valsalva maneuver and using water we irrigated the cavity.  There was no evidence of any active bleeding. The platysma was closed with a running 2-0 Vicryl in the standard fashion and the dermis was closed with multiple interrupted 2-0 Vicryl's.  The skin was closed in a subcuticular fashion with 4-0 Monocryl. Dermabond was applied The drain was sutured in place with a 3-0 nylon   Please note that this lipoma was complex and subMuscular and required significant time for dissection.  Please also note given her body habitus high risk airway and large neck mass this procedure require significant additional operative time from both planning as well as actual operative time.  Modifier 22 was applied accordingly   Needle and laparotomy count were correct and there were no immediate complications  Sterling Big, MD, FACS

## 2022-11-16 NOTE — Anesthesia Procedure Notes (Signed)
Procedure Name: Intubation Date/Time: 11/16/2022 9:11 AM  Performed by: Monico Hoar, CRNAPre-anesthesia Checklist: Patient identified, Patient being monitored, Timeout performed, Emergency Drugs available and Suction available Patient Re-evaluated:Patient Re-evaluated prior to induction Oxygen Delivery Method: Circle system utilized Preoxygenation: Pre-oxygenation with 100% oxygen Induction Type: IV induction Ventilation: Mask ventilation without difficulty Laryngoscope Size: McGraph and 4 Grade View: Grade I Tube type: Oral Tube size: 7.0 mm Number of attempts: 1 Airway Equipment and Method: Stylet Placement Confirmation: ETT inserted through vocal cords under direct vision, positive ETCO2 and breath sounds checked- equal and bilateral Secured at: 21 cm Tube secured with: Tape Dental Injury: Teeth and Oropharynx as per pre-operative assessment

## 2022-11-17 ENCOUNTER — Other Ambulatory Visit: Payer: Self-pay

## 2022-11-17 ENCOUNTER — Emergency Department: Payer: Medicaid Other

## 2022-11-17 ENCOUNTER — Encounter: Payer: Self-pay | Admitting: Intensive Care

## 2022-11-17 ENCOUNTER — Emergency Department
Admission: EM | Admit: 2022-11-17 | Discharge: 2022-11-17 | Disposition: A | Discharge status: Home / Self Care | Payer: Medicaid Other | Attending: Emergency Medicine | Admitting: Emergency Medicine

## 2022-11-17 ENCOUNTER — Telehealth: Payer: Self-pay

## 2022-11-17 DIAGNOSIS — J9811 Atelectasis: Secondary | ICD-10-CM | POA: Diagnosis not present

## 2022-11-17 DIAGNOSIS — R0602 Shortness of breath: Secondary | ICD-10-CM

## 2022-11-17 DIAGNOSIS — R0600 Dyspnea, unspecified: Secondary | ICD-10-CM | POA: Diagnosis present

## 2022-11-17 LAB — CBC
HCT: 35.6 % — ABNORMAL LOW (ref 36.0–46.0)
Hemoglobin: 11.7 g/dL — ABNORMAL LOW (ref 12.0–15.0)
MCH: 28.2 pg (ref 26.0–34.0)
MCHC: 32.9 g/dL (ref 30.0–36.0)
MCV: 85.8 fL (ref 80.0–100.0)
Platelets: 529 10*3/uL — ABNORMAL HIGH (ref 150–400)
RBC: 4.15 MIL/uL (ref 3.87–5.11)
RDW: 13 % (ref 11.5–15.5)
WBC: 9.5 10*3/uL (ref 4.0–10.5)
nRBC: 0 % (ref 0.0–0.2)

## 2022-11-17 LAB — BASIC METABOLIC PANEL
Anion gap: 11 (ref 5–15)
BUN: 7 mg/dL (ref 6–20)
CO2: 20 mmol/L — ABNORMAL LOW (ref 22–32)
Calcium: 9 mg/dL (ref 8.9–10.3)
Chloride: 104 mmol/L (ref 98–111)
Creatinine, Ser: 0.72 mg/dL (ref 0.44–1.00)
GFR, Estimated: >60 mL/min (ref >60)
Glucose, Bld: 104 mg/dL — ABNORMAL HIGH (ref 70–99)
Potassium: 3.4 mmol/L — ABNORMAL LOW (ref 3.5–5.1)
Sodium: 135 mmol/L (ref 135–145)

## 2022-11-17 LAB — LACTIC ACID, PLASMA
Lactic Acid, Venous: 2.8 mmol/L (ref 0.5–1.9)
Lactic Acid, Venous: 1.2 mmol/L (ref 0.5–1.9)

## 2022-11-17 LAB — TROPONIN I (HIGH SENSITIVITY)
Troponin I (High Sensitivity): <2 ng/L (ref <18)
Troponin I (High Sensitivity): <2 ng/L (ref <18)

## 2022-11-17 LAB — SURGICAL PATHOLOGY

## 2022-11-17 MED ORDER — SODIUM CHLORIDE 0.9 % IV BOLUS
1000.0000 mL | Freq: Once | INTRAVENOUS | Status: AC
Start: 2022-11-17 — End: 2022-11-17
  Administered 2022-11-17: 1000 mL via INTRAVENOUS

## 2022-11-17 MED ORDER — OXYCODONE-ACETAMINOPHEN 5-325 MG PO TABS
1.0000 | ORAL_TABLET | Freq: Once | ORAL | Status: AC
  Administered 2022-11-17: 1 via ORAL
  Filled 2022-11-17: qty 1

## 2022-11-17 NOTE — ED Notes (Signed)
Pt given incentive spirometer and taught how to use. Pt verbalized understanding. Family at bedside. NAD

## 2022-11-17 NOTE — ED Notes (Signed)
Jp drain output 15 ml, red, no clots

## 2022-11-17 NOTE — ED Notes (Signed)
Pt removed from [place]. 100% spo2 on room air

## 2022-11-17 NOTE — ED Provider Notes (Signed)
[facility] Provider Note   Event Date/Time   First MD Initiated Contact with Patient 11/17/22 1032     (approximate) History  Shortness of Breath and Post-op Problem  HPI Latoya Stark is a 27 y.o. 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  Patient states whenever she tries to take a deep breath it causes some pain and she is unable to take a deep breath.  Patient denies any actual shortness of breath only that she has difficulty taking a deep breath secondary to pain ROS: Patient currently denies any vision changes, tinnitus, difficulty speaking, facial droop, sore throat, abdominal pain, nausea/vomiting/diarrhea, dysuria, or weakness/numbness/paresthesias in any extremity   Physical Exam  Triage Vital Signs: ED Triage Vitals  Encounter Vitals Group     BP 11/17/22 0943 (!) 110/50     Systolic BP Percentile --      Diastolic BP Percentile --      Pulse Rate 11/17/22 0943 99     Resp 11/17/22 0943 (!) 22     Temp 11/17/22 0943 98.4 F (36.9 C)     Temp Source 11/17/22 0943 Oral     SpO2 11/17/22 0943 99 %     Weight 11/17/22 0944 (!) 334 lb 14.1 oz (151.9 kg)     Height 11/17/22 0944 5\' 5"  (1.651 m)     Head Circumference --      Peak Flow --      Pain Score 11/17/22 0944 9     Pain Loc --      Pain Education --      Exclude from Growth Chart --    Most recent vital signs: Vitals:   11/17/22 1041 11/17/22 1247  BP:  106/71  Pulse:  80  Resp:  18  Temp:  98 F (36.7 C)  SpO2: 99% 100%   General: Awake, oriented x4. CV:  Good peripheral perfusion.  Resp:  Normal effort.  Clear to auscultation bilaterally Abd:  No distention.  Other:  Young adult morbidly obese African-American female resting comfortably in no acute distress.  Surgical wound with drain in place to anterior neck without any surrounding erythema ED Results / Procedures / Treatments   Labs (all labs ordered are listed, but only abnormal results are displayed) Labs Reviewed  BASIC METABOLIC PANEL - Abnormal; Notable for the following components:      Result Value   Potassium 3.4 (*)    CO2 20 (*)    Glucose, Bld 104 (*)    All other components within normal limits  CBC - Abnormal; Notable for the following components:   Hemoglobin 11.7 (*)    HCT 35.6 (*)    Platelets 529 (*)    All other components within normal limits  LACTIC ACID, PLASMA - Abnormal; Notable for the following components:   Lactic Acid, Venous 2.8 (*)    All other components within normal limits  LACTIC ACID, PLASMA  POC URINE PREG, ED  TROPONIN I (HIGH SENSITIVITY)  TROPONIN I (HIGH SENSITIVITY)   EKG ED ECG REPORT I, Merwyn Katos, the attending physician, personally viewed and interpreted this ECG. Date: 11/17/2022 EKG Time: 0949 Rate: 103 Rhythm: Tachycardic sinus rhythm QRS Axis: normal Intervals: normal ST/T Wave abnormalities: normal Narrative Interpretation: Tachycardic sinus rhythm.  No evidence of acute ischemia RADIOLOGY ED MD interpretation: 2 view chest x-ray interpreted independent by me and shows shallow  inspiration with irregular opacity in the right perihilar region likely presenting atelectasis with mild atelectasis of the left lung base as well -Agree with radiology assessment Official radiology report(s): DG Chest 2 View  Result Date: 11/17/2022 CLINICAL DATA:  Provided history: Chest pain.  Shortness of breath. EXAM: CHEST - 2 VIEW COMPARISON:  Prior chest radiographs 05/26/2021 and earlier. Chest CT 05/26/2021. FINDINGS: Shallow inspiration radiograph. Taking this into account, heart size appears within normal limits. Irregular opacity in the right perihilar region. Mild atelectasis at the left lung base. No evidence of pleural effusion or pneumothorax. No acute osseous abnormality identified. An apparent tube/drain overlies the upper chest. IMPRESSION: 1. Shallow  inspiration radiograph. 2. Irregular opacity in the right perihilar region, which may reflect atelectasis and/or airspace consolidation. 3. Mild atelectasis at the left lung base. Electronically Signed   By: Jackey Loge D.O.   On: 11/17/2022 12:01   PROCEDURES: Critical Care performed: No .1-3 Lead EKG Interpretation  Performed by: Merwyn Katos, MD Authorized by: Merwyn Katos, MD     Interpretation: normal     ECG rate:  71   ECG rate assessment: normal     Rhythm: sinus rhythm     Ectopy: none     Conduction: normal    MEDICATIONS ORDERED IN ED: Medications  oxyCODONE-acetaminophen (PERCOCET/ROXICET) 5-325 MG per tablet 1 tablet (1 tablet Oral Given 11/17/22 1144)  sodium chloride 0.9 % bolus 1,000 mL (0 mLs Intravenous Stopped 11/17/22 1434)   IMPRESSION / MDM / ASSESSMENT AND PLAN / ED COURSE  I reviewed the triage vital signs and the nursing notes.                             The patient is on the cardiac monitor to evaluate for evidence of arrhythmia and/or significant heart rate changes. Patient's presentation is most consistent with acute presentation with potential threat to life or bodily function. The patient is suffering from pleuritic chest pain, but the immediate cause is not apparent.  Likely cause is atelectasis  Potential causes considered include, but are not limited to, asthma or COPD, congestive heart failure, pulmonary embolism, pneumothorax, coronary syndrome, pneumonia, and pleural effusion.  Despite the evaluation including history, exam, and testing, the cause of the shortness of breath remains unclear. However, during the ED stay, patient's condition improved, and at the time of discharge the shortness of breath is resolved, they are feeling well, and want to go home.  Patient taught and encouraged to use incentive spirometry for continued prophylaxis of atelectasis  Patient will be discharged with strict return precautions and advice to follow up with  primary MD within 24 hours for further evaluation.   FINAL CLINICAL IMPRESSION(S) / ED DIAGNOSES   Final diagnoses:  Shortness of breath  Atelectasis of both lungs   Rx / DC Orders   ED Discharge Orders     None      Note:  This document was prepared using Dragon voice recognition software and may include unintentional dictation errors.   Merwyn Katos, MD 11/17/22 [phone]

## 2022-11-17 NOTE — ED Triage Notes (Addendum)
Patient presents with SOB. Had "Mass neck excision" yesterday.  Patient has drain tube present in right chest

## 2022-11-17 NOTE — Discharge Instructions (Addendum)
You may take an extra dose of you oxycodone 1-2 hours after your scheduled dose if you are unable to sleep. Please use this incentive spirometer every 1 hour for the first day, every 2 hours for 3 days after that and every 4 hours until this wound has healed.

## 2022-11-17 NOTE — ED Notes (Signed)
Pt requested to finish fluid bolus before departure

## 2022-11-17 NOTE — Progress Notes (Signed)
Pt seen and examined. She is HAving a panic attack. No stridor, no evidence of expanding hematoma. Neck soft, no infection, drain serous output. D/W ER MD and ER team. Follow up as outpt. No need for interventions

## 2022-11-17 NOTE — ED Notes (Signed)
Pt is tearful and breathing quickly upon assessment. Pt nods to questions and states she cant breath". Pt is able to regulate her breathing with coaching. Pt is reassured of stable vitals. Pt is now laying in bed, blanket provided, breathing has become regular and unlabored.

## 2022-11-17 NOTE — Telephone Encounter (Signed)
Patient called and reported that she had surgery yesterday of a large neck mass. Since this morning she has been having difficulties breathing and unable to take any deep breaths, coughing up large amounts of blood and is having uncontrolled pain at the surgical area. Patient was instructed to report to the ED now. She states she is on her way. I let her know I would inform her surgeon.

## 2022-11-22 ENCOUNTER — Ambulatory Visit (INDEPENDENT_AMBULATORY_CARE_PROVIDER_SITE_OTHER): Payer: Medicaid Other | Admitting: Surgery

## 2022-11-22 ENCOUNTER — Encounter: Payer: Self-pay | Admitting: Surgery

## 2022-11-22 VITALS — BP 113/74 | HR 106 | Temp 98.0°F | Ht 65.0 in | Wt 335.0 lb

## 2022-11-22 DIAGNOSIS — Z09 Encounter for follow-up examination after completed treatment for conditions other than malignant neoplasm: Secondary | ICD-10-CM

## 2022-11-22 DIAGNOSIS — D170 Benign lipomatous neoplasm of skin and subcutaneous tissue of head, face and neck: Secondary | ICD-10-CM

## 2022-11-22 NOTE — Patient Instructions (Addendum)
Continue to use ice to the right wrist and arm 3-4 times a day. It may take a week for this area to feel better.    Keep your dressing on until tomorrow evening and then you may use just a Band-Aid over the area until it stops draining.   Follow up here in 3 weeks. Continue to use your Incentive spirometer.

## 2022-11-24 ENCOUNTER — Other Ambulatory Visit: Payer: Self-pay

## 2022-11-24 ENCOUNTER — Other Ambulatory Visit: Payer: Self-pay | Admitting: Nurse Practitioner

## 2022-11-24 ENCOUNTER — Ambulatory Visit: Payer: Medicaid Other | Admitting: Nurse Practitioner

## 2022-11-24 VITALS — BP 124/82 | HR 98 | Temp 97.9°F | Resp 16 | Ht 65.0 in | Wt 337.6 lb

## 2022-11-24 DIAGNOSIS — Z87898 Personal history of other specified conditions: Secondary | ICD-10-CM

## 2022-11-24 DIAGNOSIS — D649 Anemia, unspecified: Secondary | ICD-10-CM | POA: Insufficient documentation

## 2022-11-24 DIAGNOSIS — F330 Major depressive disorder, recurrent, mild: Secondary | ICD-10-CM | POA: Insufficient documentation

## 2022-11-24 DIAGNOSIS — G43709 Chronic migraine without aura, not intractable, without status migrainosus: Secondary | ICD-10-CM | POA: Diagnosis not present

## 2022-11-24 DIAGNOSIS — M79601 Pain in right arm: Secondary | ICD-10-CM

## 2022-11-24 DIAGNOSIS — R200 Anesthesia of skin: Secondary | ICD-10-CM

## 2022-11-24 DIAGNOSIS — J4530 Mild persistent asthma, uncomplicated: Secondary | ICD-10-CM | POA: Diagnosis not present

## 2022-11-24 DIAGNOSIS — Z7689 Persons encountering health services in other specified circumstances: Secondary | ICD-10-CM

## 2022-11-24 DIAGNOSIS — Z6841 Body Mass Index (BMI) 40.0 and over, adult: Secondary | ICD-10-CM | POA: Diagnosis not present

## 2022-11-24 DIAGNOSIS — F419 Anxiety disorder, unspecified: Secondary | ICD-10-CM | POA: Insufficient documentation

## 2022-11-24 MED ORDER — ALBUTEROL SULFATE HFA 108 (90 BASE) MCG/ACT IN AERS: 1.0000 | INHALATION_SPRAY | Freq: Four times a day (QID) | RESPIRATORY_TRACT | 11 refills | Status: DC | PRN

## 2022-11-24 MED ORDER — CETIRIZINE HCL 10 MG PO TABS
10.0000 mg | ORAL_TABLET | Freq: Every day | ORAL | 0 refills | Status: DC
  Filled 2022-11-24 – 2023-02-15 (×2): qty 30, 30d supply, fill #0

## 2022-11-24 MED ORDER — SERTRALINE HCL 25 MG PO TABS: 25.0000 mg | ORAL_TABLET | Freq: Every day | ORAL | 0 refills | Status: DC

## 2022-11-24 MED ORDER — SYMBICORT 80-4.5 MCG/ACT IN AERO: 2.0000 | INHALATION_SPRAY | Freq: Two times a day (BID) | RESPIRATORY_TRACT | 10 refills | Status: DC | PRN

## 2022-11-24 MED ORDER — FERROUS SULFATE 325 (65 FE) MG PO TBEC: 325.0000 mg | DELAYED_RELEASE_TABLET | Freq: Every day | ORAL | 1 refills | Status: DC

## 2022-11-24 MED ORDER — IBUPROFEN 200 MG PO TABS: 800.0000 mg | ORAL_TABLET | Freq: Three times a day (TID) | ORAL | 1 refills | Status: DC | PRN

## 2022-11-24 NOTE — Assessment & Plan Note (Signed)
Continue working on lifestyle modification.  

## 2022-11-24 NOTE — Assessment & Plan Note (Signed)
Start zoloft 25 mg daily, follow up in 4 weeks

## 2022-11-24 NOTE — Progress Notes (Signed)
Latoya Stark is a 27 year old status post lipoma of the neck. Came in to the emergency room recently for shortness of breath and this was found to be a panic attack. He has a drain that has been putting out between 20 and 35 cc a day.  No fevers no chills He also complained of right arm pain likely related to IV.  I had a good discussion with her regarding the reasoning for good IV access.  She is a high risk airway and I intentionally ask for 2 IVs.  She is a hard stick and there were multiple attempts on her right side. Does have some pain at incisional site  PE Nad Neck is healing well.  There is no evidence of expanding hematomas.  There is evidence of serous fluid within the JP. No evidence of infection.  JP removed  A/P Doing well Reassure her about benign clinical findings. Reassured her about benign findings on her right arm this is due to IV attempts.  unFortunately this a side effect in preparation for a high risk surgery and airway She understands thought process behind it.  He knows she is a hard IV Rtc a few weeks

## 2022-11-24 NOTE — Assessment & Plan Note (Signed)
Managed by neurology. Currently on amictal 200 mg TID, keppra 1500 mg BID.

## 2022-11-24 NOTE — Assessment & Plan Note (Signed)
currently taking albuterol as needed and symbicort 2 times daily. She says that it is exacerbated during pollen season.  Needs refill.  Refills sent.

## 2022-11-24 NOTE — Progress Notes (Signed)
BP 124/82   Pulse 98   Temp 97.9 F (36.6 C) (Oral)   Resp 16   Ht 5\' 5"  (1.651 m)   Wt (!) 337 lb 9.6 oz (153.1 kg)   LMP 10/04/2022 (Approximate)   SpO2 96%   BMI 56.18 kg/m    Subjective:    Patient ID: Latoya Stark, female    DOB: May 19, 1995, 27 y.o.   MRN: [id number]  HPI: Latoya Stark is a 27 y.o. female  Chief Complaint  Patient presents with   Establish Care   Anxiety   Asthma    Medication refills   Establish care: her last physical was years ago.  Medical history includes seizures, asthma, migraines, pinched nerve in back, anxiety and depression, ADHD.  Family history includes seizures, sickle cell, bone ca, anemia, breast ca, HTN, colon ca, DM, strokes, lymphedema, kidney failure, heart failure .  Health maintenance just had labs in er .   Anxiety/depression- reports she says that she has symptoms multiple times a week.  She says that she has had an increase since post partum.  She says that she feels the anxiety is slightly more prevalent then the depression.  Medication not currently, previously on buspar, she is unsure if it helped.   Compliant n/a Side effects n/a PHQ9 positive GAD positive    11/24/2022    1:05 PM 06/15/2021   11:24 AM 04/05/2021    9:07 AM 12/30/2020   10:46 AM  GAD 7 : Generalized Anxiety Score  Nervous, Anxious, on Edge 1 3 2 1   Control/stop worrying 1 3 3  0  Worry too much - different things 3 3 2 3   Trouble relaxing 3 3 1 1   Restless 0 3 0 1  Easily annoyed or irritable 1 3 1 2   Afraid - awful might happen 1 2 0 1  Total GAD 7 Score 10 20 9 9   Anxiety Difficulty Somewhat difficult Very difficult Not difficult at all       11/24/2022    1:04 PM 06/15/2021   11:24 AM 04/05/2021    9:06 AM 03/22/2021   11:21 AM 12/30/2020   10:42 AM  Depression screen PHQ 2/9  Decreased Interest 1 3 0 1 1  Down, Depressed, Hopeless 2 2 1 1  0  PHQ - 2 Score 3 5 1 2 1   Altered sleeping 3 1 3 3 3   Tired, decreased energy 0 3 2 2 1    Change in appetite 1 0 0 1 2  Feeling bad or failure about yourself  0 1 0 0 0  Trouble concentrating 1 0 1 2 0  Moving slowly or fidgety/restless 1 0 1 0 1  Suicidal thoughts 0 0 0 0 0  PHQ-9 Score 9 10 8 10 8   Difficult doing work/chores Somewhat difficult Somewhat difficult Not difficult at all       Obesity:  Current weight : 337 lbs BMI: 56.18 Treatment Tried: life style modification Comorbidities: asthma, anxiety, migraines   Asthma: currently taking albuterol as needed and symbicort 2 times daily. She says that it is exacerbated during pollen season.  Needs refill.  Refills sent.    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   managed by neurology. Last seen by neurology on 10/09/2022. Currently on lamictal 200 mg TID, keppra 1500 mg BID and gabapentin 200 mg at bedtime.   Right wrist pain/numbness in fingers: she reports right wrist pain and numbness in 2nd and  3rd finger.  She says she has had this since having a lipoma removed from chest.   Recommend nerve testing, she is established with neuro.  Will reach out to neurology and ask if they can do it or if she needs another referral.    Relevant past medical, surgical, family and social history reviewed and updated as indicated. Interim medical history since our last visit reviewed. Allergies and medications reviewed and updated.  Review of Systems  Constitutional: Negative for fever or weight change.  Respiratory: Negative for cough and shortness of breath.   Cardiovascular: Negative for chest pain or palpitations.  Gastrointestinal: Negative for abdominal pain, no bowel changes.  Musculoskeletal: Negative for gait problem or joint swelling. Positive for right wrist pain and numbness in fingers Skin: Negative for rash.  Neurological: Negative for dizziness or headache.  No other specific complaints in a complete review of systems (except as listed in HPI above).      Objective:    BP  124/82   Pulse 98   Temp 97.9 F (36.6 C) (Oral)   Resp 16   Ht 5\' 5"  (1.651 m)   Wt (!) 337 lb 9.6 oz (153.1 kg)   LMP 10/04/2022 (Approximate)   SpO2 96%   BMI 56.18 kg/m   Wt Readings from Last 3 Encounters:  11/24/22 (!) 337 lb 9.6 oz (153.1 kg)  11/22/22 (!) 335 lb (152 kg)  11/17/22 (!) 334 lb 14.1 oz (151.9 kg)    Physical Exam  Constitutional: Patient appears well-developed and well-nourished. Obese  No distress.  HEENT: head atraumatic, normocephalic, pupils equal and reactive to light, neck supple Cardiovascular: Normal rate, regular rhythm and normal heart sounds.  No murmur heard. No BLE edema. Pulmonary/Chest: Effort normal and breath sounds normal. No respiratory distress. Abdominal: Soft.  There is no tenderness. Psychiatric: Patient has a normal mood and affect. behavior is normal. Judgment and thought content normal.  Results for orders placed or performed during the hospital encounter of 11/17/22  Basic metabolic panel  Result Value Ref Range   Sodium 135 135 - 145 mmol/L   Potassium 3.4 (L) 3.5 - 5.1 mmol/L   Chloride 104 98 - 111 mmol/L   CO2 20 (L) 22 - 32 mmol/L   Glucose, Bld 104 (H) 70 - 99 mg/dL   BUN 7 6 - 20 mg/dL   Creatinine, Ser 7.84 0.44 - 1.00 mg/dL   Calcium 9.0 8.9 - 69.6 mg/dL   GFR, Estimated >29 >52 mL/min   Anion gap 11 5 - 15  CBC  Result Value Ref Range   WBC 9.5 4.0 - 10.5 K/uL   RBC 4.15 3.87 - 5.11 MIL/uL   Hemoglobin 11.7 (L) 12.0 - 15.0 g/dL   HCT 84.1 (L) 32.4 - 40.1 %   MCV 85.8 80.0 - 100.0 fL   MCH 28.2 26.0 - 34.0 pg   MCHC 32.9 30.0 - 36.0 g/dL   RDW 02.7 25.3 - 66.4 %   Platelets 529 (H) 150 - 400 K/uL   nRBC 0.0 0.0 - 0.2 %  Lactic acid, plasma  Result Value Ref Range   Lactic Acid, Venous 2.8 (HH) 0.5 - 1.9 mmol/L  Lactic acid, plasma  Result Value Ref Range   Lactic Acid, Venous 1.2 0.5 - 1.9 mmol/L  Troponin I (High Sensitivity)  Result Value Ref Range   Troponin I (High Sensitivity) <2 <18 ng/L   Troponin I (High Sensitivity)  Result Value Ref Range   Troponin I (High  Sensitivity) <2 <18 ng/L      Assessment & Plan:   Problem List Items Addressed This Visit       Cardiovascular and Mediastinum   Chronic migraine without aura without status migrainosus, not intractable    Managed by neurology      Relevant Medications   sertraline (ZOLOFT) 25 MG tablet   ibuprofen (ADVIL) 200 MG tablet     Respiratory   Mild persistent asthma without complication    currently taking albuterol as needed and symbicort 2 times daily. She says that it is exacerbated during pollen season.  Needs refill.  Refills sent.        Relevant Medications   SYMBICORT 80-4.5 MCG/ACT inhaler   albuterol (VENTOLIN HFA) 108 (90 Base) MCG/ACT inhaler     Other   History of seizure - Primary    Managed by neurology. Currently on amictal 200 mg TID, keppra 1500 mg BID.        BMI 50.0-59.9, adult (HCC)    Continue working on lifestyle modification      Anemia    Currently on iron supplementation      Relevant Medications   ferrous sulfate 325 (65 FE) MG EC tablet   Mild episode of recurrent major depressive disorder (HCC)    Start zoloft 25 mg daily, follow up in 4 weeks      Relevant Medications   sertraline (ZOLOFT) 25 MG tablet   Anxiety    Start zoloft 25 mg daily, follow up in 4 weeks      Relevant Medications   sertraline (ZOLOFT) 25 MG tablet   Other Visit Diagnoses     Right arm pain       referral to neurology for nerve testing   Relevant Orders   Ambulatory referral to Neurology   Numbness of fingers       referral to neurology for nerve testing   Relevant Orders   Ambulatory referral to Neurology   Encounter to establish care            Follow up plan: Return in about 4 weeks (around 12/22/2022).

## 2022-11-24 NOTE — Assessment & Plan Note (Signed)
Currently on iron supplementation

## 2022-11-24 NOTE — Assessment & Plan Note (Signed)
Managed by neurology

## 2022-11-27 ENCOUNTER — Telehealth: Payer: Self-pay | Admitting: Family Medicine

## 2022-11-27 NOTE — Telephone Encounter (Signed)
I called pt to discuss. LVM for her to call office.

## 2022-11-27 NOTE — Telephone Encounter (Signed)
Can you guys please check with patient to see if she was able to see PCP. She needs referral for 72 hour EEG. Her insurance requires referral come through PCP.

## 2022-11-28 ENCOUNTER — Ambulatory Visit (INDEPENDENT_AMBULATORY_CARE_PROVIDER_SITE_OTHER): Payer: Medicaid Other | Admitting: Adult Health

## 2022-11-28 ENCOUNTER — Other Ambulatory Visit: Payer: Self-pay | Admitting: Nurse Practitioner

## 2022-11-28 ENCOUNTER — Telehealth: Payer: Self-pay | Admitting: Family Medicine

## 2022-11-28 ENCOUNTER — Encounter (INDEPENDENT_AMBULATORY_CARE_PROVIDER_SITE_OTHER): Payer: Self-pay | Admitting: Adult Health

## 2022-11-28 VITALS — BP 120/80 | HR 86 | Temp 98.1°F | Ht 65.5 in | Wt 335.0 lb

## 2022-11-28 DIAGNOSIS — R739 Hyperglycemia, unspecified: Secondary | ICD-10-CM | POA: Diagnosis not present

## 2022-11-28 DIAGNOSIS — E669 Obesity, unspecified: Secondary | ICD-10-CM

## 2022-11-28 DIAGNOSIS — Z6841 Body Mass Index (BMI) 40.0 and over, adult: Secondary | ICD-10-CM

## 2022-11-28 DIAGNOSIS — Z0289 Encounter for other administrative examinations: Secondary | ICD-10-CM

## 2022-11-28 DIAGNOSIS — G43709 Chronic migraine without aura, not intractable, without status migrainosus: Secondary | ICD-10-CM

## 2022-11-28 MED ORDER — IBUPROFEN 800 MG PO TABS: 800.0000 mg | ORAL_TABLET | Freq: Three times a day (TID) | ORAL | 1 refills | Status: DC | PRN

## 2022-11-28 NOTE — Telephone Encounter (Signed)
Pt called wanting to know if her Provider has sent in a referral for her to be able to get a NCV/EMG. Pt also stated she will call her PCP for the 72hr EEG.

## 2022-11-28 NOTE — Telephone Encounter (Signed)
FYI

## 2022-11-28 NOTE — Telephone Encounter (Signed)
Copied from CRM [id number]. Topic: General - Other >> Nov 28, 2022  8:48 AM Clide Dales wrote: Patient states that her neurologist is requesting that patient's pcp put in an order for a 72 hr home EKG test. Patient states that the neurologist never received the request to do the nerve study on right arm.Please advise.

## 2022-11-28 NOTE — Telephone Encounter (Signed)
Duplicate, see other phone note.

## 2022-11-28 NOTE — Telephone Encounter (Signed)
Can you all call pt to get her set up? Referral placed by PCP for EMG/NCS 11/24/22. Thank you

## 2022-11-28 NOTE — Telephone Encounter (Signed)
Note from Jael in phone room: "Pt called wanting to know if her Provider has sent in a referral for her to be able to get a NCV/EMG. Pt also stated she will call her PCP for the 72hr EEG. "

## 2022-11-28 NOTE — Telephone Encounter (Signed)
Copied from CRM [id number]. Topic: General - Other >> Nov 28, 2022  8:52 AM Clide Dales wrote: Patient said that the pharmacy told her they cannot fill Ibuprofen 200mg , they can only fill higher doses. Can rx be update to 800mg  tablets. Please advise.

## 2022-11-28 NOTE — Progress Notes (Signed)
Office: [phone]  /  Fax: [phone]   Initial Visit  Latoya Stark was seen in clinic today to evaluate for obesity. She is interested in losing weight to improve overall health and reduce the risk of weight related complications. She presents today to review program treatment options, initial physical assessment, and evaluation.     She was referred by: Specialist  When asked what else they would like to accomplish? She states: Improve energy levels and physical activity, Improve existing medical conditions, Reduce number of medications, and Improve quality of life  Weight history: per pt- weight has been a concern her entire life  When asked how has your weight affected you? She states: Contributed to medical problems, Contributed to orthopedic problems or mobility issues, Having fatigue, Having poor endurance, and Problems with eating patterns  Some associated conditions: PCOS and Other: Gestational Diabetes  Contributing factors: Family history of obesity, Use of obesogenic medications: Psychotropic medications and Contraceptives or hormonal therapy, Reduced physical activity, and Eating patterns  Weight promoting medications identified: Psychotropic medications, Antiepileptics, and Contraceptives or hormonal therapy  Current nutrition plan: Other: Lean Protein with fresh fruit and vegetables  Current level of physical activity: Walking  Current or previous pharmacotherapy: Metformin  Response to medication: Ineffective so it was discontinued, she also reports "feeling just off" when taking Metformin.  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  Peak Weight: 350 , Body Fat%:53.6, Visceral Fat Rating:19, Weight trend over the last 12 months: Increasing/Decreasing  General:  Alert, oriented and cooperative. Patient is in no acute distress.  Respiratory: Normal respiratory effort, no problems with respiration noted   Gait: able to ambulate independently  Mental Status: Normal mood and affect. Normal behavior. Normal judgment and thought content.   DIAGNOSTIC DATA REVIEWED:  BMET    Component Value Date/Time   NA 135 11/17/2022 1031   NA 137 12/30/2020 1153   K 3.4 (L) 11/17/2022 1031   CL 104 11/17/2022 1031   CO2 20 (L) 11/17/2022 1031   GLUCOSE 104 (H) 11/17/2022 1031   BUN 7 11/17/2022 1031   BUN 5 (L) 12/30/2020 1153   CREATININE 0.72 11/17/2022  1031   CALCIUM 9.0 11/17/2022 1031   GFRNONAA >60 11/17/2022 1031   GFRAA >60 02/20/2019 2329   Lab Results  Component Value Date   HGBA1C 5.5 06/07/2021   HGBA1C  12/13/2006    5.6 (NOTE)   The ADA recommends the following therapeutic goals for glycemic   control related to Hgb A1C measurement:   Goal of Therapy:   < 7.0% Hgb A1C   Action Suggested:  > 8.0% Hgb A1C   Ref:  Diabetes Care, 22, Suppl. 1, 1999   No  results found for: "INSULIN" CBC    Component Value Date/Time   WBC 9.5 11/17/2022 1031   RBC 4.15 11/17/2022 1031   HGB 11.7 (L) 11/17/2022 1031   HGB 10.9 (L) 04/05/2021 1122   HCT 35.6 (L) 11/17/2022 1031   HCT 32.2 (L) 04/05/2021 1122   PLT 529 (H) 11/17/2022 1031   PLT 279 04/05/2021 1122   MCV 85.8 11/17/2022 1031   MCV 84 04/05/2021 1122   MCH 28.2 11/17/2022 1031   MCHC 32.9 11/17/2022 1031   RDW 13.0 11/17/2022 1031   RDW 13.1 04/05/2021 1122   Iron/TIBC/Ferritin/ %Sat    Component Value Date/Time   IRON 115 02/24/2014 1222   IRONPCTSAT 24.5 02/24/2014 1222   Lipid Panel     Component Value Date/Time   CHOL  12/13/2006 0630    122        ATP III CLASSIFICATION:  <200     mg/dL   Desirable  409-811  mg/dL   Borderline High  >=914    mg/dL   High   TRIG 78 78/29/5621 0630   HDL 30 (L) 12/13/2006 0630   CHOLHDL 4.1 12/13/2006 0630   VLDL 16 12/13/2006 0630   LDLCALC  12/13/2006 0630    76        Total Cholesterol/HDL:CHD Risk Coronary Heart Disease Risk Table                     Men   Women  1/2 Average Risk   3.4   3.3   Hepatic Function Panel     Component Value Date/Time   PROT 6.5 05/26/2021 1637   PROT 6.8 12/30/2020 1153   ALBUMIN 3.1 (L) 05/26/2021 1637   ALBUMIN 4.1 12/30/2020 1153   AST 22 05/26/2021 1637   ALT 26 05/26/2021 1637   ALKPHOS 117 05/26/2021 1637   BILITOT 0.3 05/26/2021 1637   BILITOT <0.2 12/30/2020 1153      Component Value Date/Time   TSH 1.430 09/26/2022 1259     Assessment and Plan:   BMI 50.0-59.9, adult (HCC), Starting BMI 54.88  Blood glucose elevated   ESTABLISH WITH HWW   Obesity Treatment / Action Plan:  Patient will work on garnering support from family and friends to begin weight loss journey. Will work on eliminating or reducing the presence of highly palatable, calorie dense foods in the home. Will complete provided nutritional and psychosocial assessment questionnaire before the next  appointment. Will be scheduled for indirect calorimetry to determine resting energy expenditure in a fasting state.  This will allow [place] to create a reduced calorie, high-protein meal plan to promote loss of fat mass while preserving muscle mass. Counseled on the health benefits of losing 5%-15% of total body weight. Was counseled on nutritional approaches to weight loss and benefits of reducing processed foods and consuming plant-based foods and high quality protein as part of nutritional weight management. Was counseled on pharmacotherapy  and role as an adjunct in weight management.   Obesity Education Performed Today:  She was weighed on the bioimpedance scale and results were discussed and documented in the synopsis.  We discussed obesity as a disease and the importance of a more detailed evaluation of all the factors contributing to the disease.  We discussed the importance of long term lifestyle changes which include nutrition, exercise and behavioral modifications as well as the importance of customizing this to her specific health and social needs.  We discussed the benefits of reaching a healthier weight to alleviate the symptoms of existing conditions and reduce the risks of the biomechanical, metabolic and psychological effects of obesity.  Latoya Stark appears to be in the action stage of change and states they are ready to start intensive lifestyle modifications and behavioral modifications.  30 minutes was spent today on this visit including the above counseling, pre-visit chart review, and post-visit documentation.  Reviewed by clinician on day of visit: allergies, medications, problem list, medical history, surgical history, family history, social history, and previous encounter notes pertinent to obesity diagnosis.   Latoya Stark d. Kasidy Gianino, NP-C

## 2022-12-04 ENCOUNTER — Other Ambulatory Visit: Payer: Self-pay | Admitting: Physician Assistant

## 2022-12-04 ENCOUNTER — Other Ambulatory Visit: Payer: Self-pay | Admitting: Nurse Practitioner

## 2022-12-04 DIAGNOSIS — F419 Anxiety disorder, unspecified: Secondary | ICD-10-CM

## 2022-12-04 DIAGNOSIS — G8929 Other chronic pain: Secondary | ICD-10-CM

## 2022-12-04 DIAGNOSIS — F330 Major depressive disorder, recurrent, mild: Secondary | ICD-10-CM

## 2022-12-04 MED ORDER — PRENATAL 27-0.8 MG PO TABS: 1.0000 | ORAL_TABLET | Freq: Every day | ORAL | 0 refills | Status: DC

## 2022-12-04 MED ORDER — DOCUSATE SODIUM 100 MG PO CAPS: 100.0000 mg | ORAL_CAPSULE | Freq: Every day | ORAL | 1 refills | Status: DC | PRN

## 2022-12-11 ENCOUNTER — Other Ambulatory Visit: Payer: Self-pay

## 2022-12-12 ENCOUNTER — Ambulatory Visit: Payer: Self-pay

## 2022-12-12 NOTE — Telephone Encounter (Signed)
  Chief Complaint: Vaginal bleeding 1.5 months - fairly heavy Symptoms: drowsy - cold Frequency: 1.5 months Pertinent Negatives: Patient denies  Disposition: [] ED /[] Urgent Care (no appt availability in office) / [] Appointment(In office/virtual)/ []  [facility]/ [] Home Care/ [x] Refused Recommended Disposition /[] [place] [facility]/ []  Follow-up with PCP Additional Notes: Pt called  stating that she has had fairly heavy bleeding for 1.5 months. Pt is going through a tampon and pad every 1/2 hour and uses pull ups at night. Pt is also a bit drowsy and is often cold. Per protocol advise pt got to ED. Pt has an appt with OB/GYN tomorrow and will try to wait until then. Pt will go to ED if needed.  Reason for Disposition  SEVERE vaginal bleeding (e.g., soaking 2 pads or tampons per hour and present 2 or more hours; 1 menstrual cup every 2 hours)  Answer Assessment - Initial Assessment Questions 1. AMOUNT: "Describe the bleeding that you are having."    - SPOTTING: spotting, or pinkish / brownish mucous discharge; does not fill panty liner or pad    - MILD:  less than 1 pad / hour; less than patient's usual menstrual bleeding   - MODERATE: 1-2 pads / hour; 1 menstrual cup every 6 hours; small-medium blood clots (e.g., pea, grape, small coin)   - SEVERE: soaking 2 or more pads/hour for 2 or more hours; 1 menstrual cup every 2 hours; bleeding not contained by pads or continuous red blood from vagina; large blood clots (e.g., golf ball, large coin)      Change a tampon every 30 minutes, also a pad and a pull up. 2. ONSET: "When did the bleeding begin?" "Is it continuing now?"     1.5 months 3. MENSTRUAL PERIOD: "When was the last normal menstrual period?" "How is this different than your period?"     Very long, very heavy 4. REGULARITY: "How regular are your periods?"     yes 5. ABDOMEN PAIN: "Do you have any pain?" "How bad is the pain?"  (e.g., Scale 1-10; mild, moderate, or  severe)   - MILD (1-3): doesn't interfere with normal activities, abdomen soft and not tender to touch    - MODERATE (4-7): interferes with normal activities or awakens from sleep, abdomen tender to touch    - SEVERE (8-10): excruciating pain, doubled over, unable to do any normal activities      moderate 6. PREGNANCY: "Is there any chance you are pregnant?" "When was your last menstrual period?"     now 12. OTHER SYMPTOMS: "What other symptoms are you having with the bleeding?" (e.g., passed tissue, vaginal discharge, fever, menstrual-type cramps)       Very cold. Drowsy, Migraines  Protocols used: Vaginal Bleeding - Abnormal-A-AH

## 2022-12-13 ENCOUNTER — Encounter: Payer: Self-pay | Admitting: Surgery

## 2022-12-13 ENCOUNTER — Encounter: Payer: Self-pay | Admitting: Family Medicine

## 2022-12-13 ENCOUNTER — Ambulatory Visit (INDEPENDENT_AMBULATORY_CARE_PROVIDER_SITE_OTHER): Payer: Medicaid Other | Admitting: Surgery

## 2022-12-13 ENCOUNTER — Ambulatory Visit: Payer: Medicaid Other | Admitting: Family Medicine

## 2022-12-13 VITALS — BP 123/86 | HR 93 | Temp 97.9°F | Ht 65.5 in | Wt 336.2 lb

## 2022-12-13 VITALS — BP 132/91 | HR 99 | Wt 340.0 lb

## 2022-12-13 DIAGNOSIS — Z09 Encounter for follow-up examination after completed treatment for conditions other than malignant neoplasm: Secondary | ICD-10-CM

## 2022-12-13 DIAGNOSIS — D170 Benign lipomatous neoplasm of skin and subcutaneous tissue of head, face and neck: Secondary | ICD-10-CM

## 2022-12-13 DIAGNOSIS — N939 Abnormal uterine and vaginal bleeding, unspecified: Secondary | ICD-10-CM

## 2022-12-13 DIAGNOSIS — Z3009 Encounter for other general counseling and advice on contraception: Secondary | ICD-10-CM | POA: Diagnosis not present

## 2022-12-13 LAB — CBC
Hematocrit: 35.8 % (ref 34.0–46.6)
Hemoglobin: 12 g/dL (ref 11.1–15.9)
MCH: 28.6 pg (ref 26.6–33.0)
MCHC: 33.5 g/dL (ref 31.5–35.7)
MCV: 85 fL (ref 79–97)
Platelets: 400 10*3/uL (ref 150–450)
RBC: 4.19 x10E6/uL (ref 3.77–5.28)
RDW: 12.8 % (ref 11.7–15.4)
WBC: 6.2 10*3/uL (ref 3.4–10.8)

## 2022-12-13 MED ORDER — TYBLUME 0.1-20 MG-MCG PO CHEW: 1.0000 | CHEWABLE_TABLET | Freq: Every day | ORAL | Status: DC

## 2022-12-13 NOTE — Progress Notes (Signed)
   Subjective:    Patient ID: Latoya Stark is a 27 y.o. female presenting with Vaginal Bleeding  on 12/13/2022  HPI: Has been on Depo in the past. Re-started in September and has been bleeding since mid-October. She is due for next one and this is recurrent but worse this month. She would like to come off depo. She does not desire pregnancy in the next year.  Review of Systems  Constitutional:  Negative for chills and fever.  Respiratory:  Negative for shortness of breath.   Cardiovascular:  Negative for chest pain.  Gastrointestinal:  Negative for abdominal pain, nausea and vomiting.  Genitourinary:  Negative for dysuria.  Skin:  Negative for rash.      Objective:    BP (!) 132/91   Pulse 99   Wt (!) 340 lb (154.2 kg)   BMI 55.72 kg/m  Physical Exam Exam conducted with a chaperone present.  Constitutional:      General: She is not in acute distress.    Appearance: She is well-developed.  HENT:     Head: Normocephalic and atraumatic.  Eyes:     General: No scleral icterus. Cardiovascular:     Rate and Rhythm: Normal rate.  Pulmonary:     Effort: Pulmonary effort is normal.  Abdominal:     Palpations: Abdomen is soft.  Musculoskeletal:     Cervical back: Neck supple.  Skin:    General: Skin is warm and dry.  Neurological:     Mental Status: She is alert and oriented to person, place, and time.         Assessment & Plan:  Abnormal uterine bleeding - Likely related to too much progestational effect--trial of PO COC's x 1 month to correct. - Plan: CBC, Levonorgestrel-Ethinyl Estrad (TYBLUME) 0.1-20 MG-MCG CHEW  Encounter for counseling regarding contraception - May use condoms, NFP or return for IUD insertion if needed.  Return if symptoms worsen or fail to improve.  Reva Bores, MD 12/13/2022 11:02 AM

## 2022-12-13 NOTE — Progress Notes (Signed)
Latoya Stark is following after excision of neck lipoma.  She is doing well. Fevers no chills. Some pain in the right arm PE NAD Neck: Patient is healing well without evidence of infection, no hematomas or seromas. Arm: There is subtle right arm edema almost imperceptible, no evidence of compartment syndrome the right side.  Very well perfused right hand   A/p arm pain likely related to multiple attempts of IV. From surgical perspective no complications RTC prn

## 2022-12-13 NOTE — Progress Notes (Signed)
CC: Vaginal bleeding on depo    Last depo was 10/02/22   Pt has been on depo on and off for years    Pt stating that she is bleeding this time x 1.5 months

## 2022-12-13 NOTE — Patient Instructions (Addendum)
Follow up as needed.   Excision of Skin Lesions, Care After The following information offers guidance on how to care for yourself after your procedure. Your health care provider may also give you more specific instructions. If you have problems or questions, contact your health care provider. What can I expect after the procedure? After your procedure, it is common to have: Soreness or mild pain. Some redness and swelling. Follow these instructions at home: Excision site care  Follow instructions from your health care provider about how to take care of your excision site. Make sure you: Wash your hands with soap and water for at least 20 seconds before and after you change your bandage (dressing). If soap and water are not available, use hand sanitizer. Change your dressing as told by your health care provider. Leave stitches (sutures), skin glue, or adhesive strips in place. These skin closures may need to stay in place for 2 weeks or longer. If adhesive strip edges start to loosen and curl up, you may trim the loose edges. Do not remove adhesive strips completely unless your health care provider tells you to do that. Check the excision area every day for signs of infection. Watch for: More redness, swelling, or pain. Fluid or blood. Warmth. Pus or a bad smell. Keep the site clean, dry, and protected for at least 48 hours. For bleeding, apply gentle but firm pressure to the area using a folded towel for 20 minutes. Do not take baths, swim, or use a hot tub until your health care provider approves. Ask your health care provider if you may take showers. You may only be allowed to take sponge baths. General instructions Take over-the-counter and prescription medicines only as told by your health care provider. Follow instructions from your health care provider about how to minimize scarring. Scarring should lessen over time. Avoid sun exposure until the area has healed. Use sunscreen to  protect the area from the sun after it has healed. Avoid high-impact exercise and activities until the sutures are removed or the area heals. Keep all follow-up visits. This is important. Contact a health care provider if: You have more redness, swelling, or pain around your excision site. You have fluid or blood coming from your excision site. Your excision site feels warm to the touch. You have pus or a bad smell coming from your excision site. You have a fever. You have pain that does not improve in 2-3 days after your procedure. Get help right away if: You have bleeding that does not stop with pressure or a dressing. Your wound opens up. Summary Take over-the-counter and prescription medicines only as told by your health care provider. Change your dressing as told by your health care provider. Contact a health care provider if you have redness, swelling, pain, or other signs of infection around your excision site. Keep all follow-up visits. This is important. This information is not intended to replace advice given to you by your health care provider. Make sure you discuss any questions you have with your health care provider. Document Revised: 07/27/2020 Document Reviewed: 07/27/2020 Elsevier Patient Education  2024 [employer].

## 2022-12-14 ENCOUNTER — Ambulatory Visit: Payer: Medicaid Other | Admitting: Obstetrics and Gynecology

## 2022-12-16 ENCOUNTER — Other Ambulatory Visit: Payer: Self-pay | Admitting: Nurse Practitioner

## 2022-12-16 DIAGNOSIS — F330 Major depressive disorder, recurrent, mild: Secondary | ICD-10-CM

## 2022-12-16 DIAGNOSIS — F419 Anxiety disorder, unspecified: Secondary | ICD-10-CM

## 2022-12-18 NOTE — Telephone Encounter (Signed)
Requested medications are due for refill today.  yes  Requested medications are on the active medications list.  yes  Last refill. 11/24/2022 #30 0 rf  Future visit scheduled.   yes  Notes to clinic.  Labs are expired.    Requested Prescriptions  Pending Prescriptions Disp Refills   sertraline (ZOLOFT) 25 MG tablet [Pharmacy Med Name: SERTRALINE HCL 25 MG TABLET] 90 tablet 1    Sig: Take 1 tablet (25 mg total) by mouth daily.     Psychiatry:  Antidepressants - SSRI - sertraline Failed - 12/16/2022  9:30 AM      Failed - AST in normal range and within 360 days    AST  Date Value Ref Range Status  05/26/2021 22 15 - 41 U/L Final         Failed - ALT in normal range and within 360 days    ALT  Date Value Ref Range Status  05/26/2021 26 0 - 44 U/L Final         Passed - Completed PHQ-2 or PHQ-9 in the last 360 days      Passed - Valid encounter within last 6 months    Recent Outpatient Visits           3 weeks ago History of seizure   [facility] Berniece Salines, FNP   3 years ago Blood pressure check   [facility] Grayce Sessions, NP   3 years ago Encounter to establish care   [facility] Grayce Sessions, NP       Future Appointments             In 4 days Mecum, Oswaldo Conroy, PA-C [facility]   In 3 weeks Wilder Glade, MD [facility]

## 2022-12-22 ENCOUNTER — Ambulatory Visit: Payer: Medicaid Other | Admitting: Physician Assistant

## 2022-12-22 DIAGNOSIS — F330 Major depressive disorder, recurrent, mild: Secondary | ICD-10-CM

## 2022-12-22 DIAGNOSIS — Z87898 Personal history of other specified conditions: Secondary | ICD-10-CM

## 2022-12-22 DIAGNOSIS — F419 Anxiety disorder, unspecified: Secondary | ICD-10-CM

## 2022-12-22 DIAGNOSIS — J4530 Mild persistent asthma, uncomplicated: Secondary | ICD-10-CM

## 2022-12-22 DIAGNOSIS — G43709 Chronic migraine without aura, not intractable, without status migrainosus: Secondary | ICD-10-CM

## 2022-12-22 DIAGNOSIS — Z6841 Body Mass Index (BMI) 40.0 and over, adult: Secondary | ICD-10-CM

## 2023-01-05 ENCOUNTER — Ambulatory Visit: Payer: Medicaid Other

## 2023-01-08 ENCOUNTER — Ambulatory Visit (INDEPENDENT_AMBULATORY_CARE_PROVIDER_SITE_OTHER): Payer: Medicaid Other | Admitting: Family Medicine

## 2023-01-12 ENCOUNTER — Ambulatory Visit: Payer: Self-pay

## 2023-01-12 ENCOUNTER — Telehealth: Payer: Self-pay

## 2023-01-12 NOTE — Telephone Encounter (Signed)
  Chief Complaint: Anxiety medication not working Symptoms: anxiety - can't sleep Frequency: ongoing Pertinent Negatives: Patient denies self harm Disposition: [] ED /[] Urgent Care (no appt availability in office) / [x] Appointment(In office/virtual)/ []  [facility]/ [] Home Care/ [] Refused Recommended Disposition /[] [place] [facility]/ []  Follow-up with PCP Additional Notes: Pt states that anxiety medication is not working. She is taking it everyday. She is also having trouble falling asleep. In the past she has been given trazodone  and ambien . These were also not effective.  Please advise.   Reason for Disposition  [1] Symptoms of anxiety or panic attack AND [2] is a chronic symptom (recurrent or ongoing AND present > 4 weeks)  Answer Assessment - Initial Assessment Questions 1. CONCERN: Did anything happen that prompted you to call today?      Panic attack 2. ANXIETY SYMPTOMS: Can you describe how you (your loved one; patient) have been feeling? (e.g., tense, restless, panicky, anxious, keyed up, overwhelmed, sense of impending doom).      overwhelmed 3. ONSET: How long have you been feeling this way? (e.g., hours, days, weeks)     awhile 4. SEVERITY: How would you rate the level of anxiety? (e.g., 0 - 10; or mild, moderate, severe).     7/10 5. FUNCTIONAL IMPAIRMENT: How have these feelings affected your ability to do daily activities? Have you had more difficulty than usual doing your normal daily activities? (e.g., getting better, same, worse; self-care, school, work, interactions)     Yes. Had to cancel appt for her back, did not feel she could come 7. RISK OF HARM - SUICIDAL IDEATION: Do you ever have thoughts of hurting or killing yourself? If Yes, ask:  Do you have these feelings now? Do you have a plan on how you would do this?     no 8. TREATMENT:  What has been done so far to treat this anxiety? (e.g., medicines, relaxation strategies). What has  helped?     medications 9. TREATMENT - THERAPIST: Do you have a counselor or therapist? Name?     no 10. POTENTIAL TRIGGERS: Do you drink caffeinated beverages (e.g., coffee, colas, teas), and how much daily? Do you drink alcohol or use any drugs? Have you started any new medicines recently?       no 11. PATIENT SUPPORT: Who is with you now? Who do you live with? Do you have family or friends who you can talk to?        no 12. OTHER SYMPTOMS: Do you have any other symptoms? (e.g., feeling depressed, trouble concentrating, trouble sleeping, trouble breathing, palpitations or fast heartbeat, chest pain, sweating, nausea, or diarrhea)       Nightmare - hard to sleep  Protocols used: Anxiety and Panic Attack-A-AH

## 2023-01-12 NOTE — Telephone Encounter (Signed)
 Patient stated that she is currently taking Pantoprazole 40 MG BID and it's not helping her any longer. Patient would like to know what else she is able to take to help her control her symptoms: acid reflux, chest burning and regurgitating.

## 2023-01-12 NOTE — Telephone Encounter (Signed)
 Appt for 01/18/23

## 2023-01-18 ENCOUNTER — Ambulatory Visit (INDEPENDENT_AMBULATORY_CARE_PROVIDER_SITE_OTHER): Payer: Medicaid Other | Admitting: Nurse Practitioner

## 2023-01-18 ENCOUNTER — Other Ambulatory Visit: Payer: Self-pay

## 2023-01-18 ENCOUNTER — Encounter: Payer: Self-pay | Admitting: Nurse Practitioner

## 2023-01-18 VITALS — BP 122/84 | HR 100 | Temp 98.0°F | Resp 16 | Ht 65.0 in | Wt 344.2 lb

## 2023-01-18 DIAGNOSIS — G43709 Chronic migraine without aura, not intractable, without status migrainosus: Secondary | ICD-10-CM | POA: Diagnosis not present

## 2023-01-18 DIAGNOSIS — F330 Major depressive disorder, recurrent, mild: Secondary | ICD-10-CM | POA: Diagnosis not present

## 2023-01-18 DIAGNOSIS — F419 Anxiety disorder, unspecified: Secondary | ICD-10-CM | POA: Diagnosis not present

## 2023-01-18 MED ORDER — IBUPROFEN 800 MG PO TABS: 800.0000 mg | ORAL_TABLET | Freq: Three times a day (TID) | ORAL | 1 refills | Status: DC | PRN

## 2023-01-18 MED ORDER — SERTRALINE HCL 50 MG PO TABS: 50.0000 mg | ORAL_TABLET | Freq: Every day | ORAL | 0 refills | Status: DC

## 2023-01-18 MED ORDER — HYDROXYZINE PAMOATE 25 MG PO CAPS: 25.0000 mg | ORAL_CAPSULE | Freq: Every evening | ORAL | 0 refills | Status: DC | PRN

## 2023-01-18 NOTE — Progress Notes (Signed)
 BP 122/84 (Cuff Size: Large)   Pulse 100   Temp 98 F (36.7 C) (Oral)   Resp 16   Ht 5' 5 (1.651 m)   Wt (!) 344 lb 3.2 oz (156.1 kg)   BMI 57.28 kg/m    Subjective:    Patient ID: Latoya Stark, female    DOB: 10/18/1995, 28 y.o.   MRN: [id number]  HPI: Latoya Stark is a 28 y.o. female  Chief Complaint  Patient presents with   Anxiety    4 week recheck. Stated she feels like she is having panic attacks at night    Discussed the use of AI scribe software for clinical note transcription with the patient, who gave verbal consent to proceed.  History of Present Illness   The patient, with a history of depression and anxiety, reports that her symptoms are not well controlled on Zoloft  25mg  daily. She describes some improvement in her depressive symptoms, but her anxiety remains high, with nocturnal panic attacks. She has previously tried other medications, including Focalin and Ritalin, but discontinued them due to perceived worsening of her depressive symptoms. She has a desire to manage her symptoms without medication, but has found this approach to be unsuccessful.  In addition to her mental health concerns, the patient also reports ongoing headaches. She has been in contact with a neurologist, but has not yet undergone the recommended nerve test. She also has a sleep study scheduled in the near future, which she will be completing at home. She is currently taking ibuprofen  for pain management, but has recently run out and requires a refill.       01/18/2023    7:31 AM 11/24/2022    1:04 PM 06/15/2021   11:24 AM  Depression screen PHQ 2/9  Decreased Interest 1 1 3   Down, Depressed, Hopeless 0 2 2  PHQ - 2 Score 1 3 5   Altered sleeping 3 3 1   Tired, decreased energy 1 0 3  Change in appetite 1 1 0  Feeling bad or failure about yourself  0 0 1  Trouble concentrating 0 1 0  Moving slowly or fidgety/restless 3 1 0  Suicidal thoughts 0 0 0  PHQ-9 Score 9 9 10   Difficult  doing work/chores  Somewhat difficult Somewhat difficult       01/18/2023    7:32 AM 11/24/2022    1:05 PM 06/15/2021   11:24 AM 04/05/2021    9:07 AM  GAD 7 : Generalized Anxiety Score  Nervous, Anxious, on Edge 3 1 3 2   Control/stop worrying 3 1 3 3   Worry too much - different things 3 3 3 2   Trouble relaxing 3 3 3 1   Restless 1 0 3 0  Easily annoyed or irritable 3 1 3 1   Afraid - awful might happen 2 1 2  0  Total GAD 7 Score 18 10 20 9   Anxiety Difficulty Somewhat difficult Somewhat difficult Very difficult Not difficult at all     Relevant past medical, surgical, family and social history reviewed and updated as indicated. Interim medical history since our last visit reviewed. Allergies and medications reviewed and updated.  Review of Systems  Constitutional: Negative for fever or weight change.  Respiratory: Negative for cough and shortness of breath.   Cardiovascular: Negative for chest pain or palpitations.  Gastrointestinal: Negative for abdominal pain, no bowel changes.  Musculoskeletal: Negative for gait problem or joint swelling.  Skin: Negative for rash.  Neurological: Negative for dizziness  or headache.  No other specific complaints in a complete review of systems (except as listed in HPI above).      Objective:    BP 122/84 (Cuff Size: Large)   Pulse 100   Temp 98 F (36.7 C) (Oral)   Resp 16   Ht 5' 5 (1.651 m)   Wt (!) 344 lb 3.2 oz (156.1 kg)   BMI 57.28 kg/m    Wt Readings from Last 3 Encounters:  01/18/23 (!) 344 lb 3.2 oz (156.1 kg)  12/13/22 (!) 340 lb (154.2 kg)  12/13/22 (!) 336 lb 3.2 oz (152.5 kg)    Physical Exam  Constitutional: Patient appears well-developed and well-nourished. Obese  No distress.  HEENT: head atraumatic, normocephalic, pupils equal and reactive to light, neck supple Cardiovascular: Normal rate, regular rhythm and normal heart sounds.  No murmur heard. No BLE edema. Pulmonary/Chest: Effort normal and breath sounds  normal. No respiratory distress. Abdominal: Soft.  There is no tenderness. Psychiatric: Patient has a normal mood and affect. behavior is normal. Judgment and thought content normal.  Results for orders placed or performed in visit on 12/13/22  CBC   Collection Time: 12/13/22 11:10 AM  Result Value Ref Range   WBC 6.2 3.4 - 10.8 x10E3/uL   RBC 4.19 3.77 - 5.28 x10E6/uL   Hemoglobin 12.0 11.1 - 15.9 g/dL   Hematocrit 64.1 65.9 - 46.6 %   MCV 85 79 - 97 fL   MCH 28.6 26.6 - 33.0 pg   MCHC 33.5 31.5 - 35.7 g/dL   RDW 87.1 88.2 - 84.5 %   Platelets 400 150 - 450 x10E3/uL       Assessment & Plan:   Problem List Items Addressed This Visit       Cardiovascular and Mediastinum   Chronic migraine without aura without status migrainosus, not intractable   Relevant Medications   sertraline  (ZOLOFT ) 50 MG tablet   ibuprofen  (IBU) 800 MG tablet     Other   Mild episode of recurrent major depressive disorder (HCC)   Relevant Medications   sertraline  (ZOLOFT ) 50 MG tablet   hydrOXYzine  (VISTARIL ) 25 MG capsule   Anxiety - Primary   Relevant Medications   sertraline  (ZOLOFT ) 50 MG tablet   hydrOXYzine  (VISTARIL ) 25 MG capsule     Assessment and Plan    Depression and Anxiety Partial response to Zoloft  25mg  daily. History of multiple medication trials with limited success. Patient reports nocturnal panic attacks. -Increase Zoloft  dose. -Add hydroxyzine  at bedtime medication for anxiety -Follow-up in 4 weeks to assess response to medication changes.  Headaches Patient reports ongoing headaches and awaiting nerve test from neurologist. -Advise patient to follow-up with neurologist regarding nerve test.  Sleep Study Scheduled for home sleep study in approximately 2 weeks. -Continue with planned sleep study.  Ibuprofen  Patient reports needing a refill of ibuprofen . -Send refill of ibuprofen  to [facility].        Follow up plan: Return in about 4 weeks (around 02/15/2023) for follow  up.

## 2023-01-25 ENCOUNTER — Ambulatory Visit (INDEPENDENT_AMBULATORY_CARE_PROVIDER_SITE_OTHER): Payer: Medicaid Other | Admitting: Family Medicine

## 2023-01-25 MED ORDER — VOQUEZNA 20 MG PO TABS
1.0000 | ORAL_TABLET | Freq: Every day | ORAL | 1 refills | Status: DC
  Filled 2023-06-15: qty 30, 30d supply, fill #0

## 2023-01-25 MED ORDER — VOQUEZNA 10 MG PO TABS
1.0000 | ORAL_TABLET | Freq: Every day | ORAL | 6 refills | Status: DC
  Filled 2023-06-15: qty 30, 30d supply, fill #0

## 2023-01-25 NOTE — Telephone Encounter (Signed)
Latoya Stark was sent to patient's pharmacy.

## 2023-01-26 ENCOUNTER — Telehealth: Payer: Self-pay | Admitting: Family Medicine

## 2023-01-26 ENCOUNTER — Institutional Professional Consult (permissible substitution): Payer: Medicaid Other | Admitting: Neurology

## 2023-01-26 NOTE — Telephone Encounter (Signed)
Pt needed to r/s her consult appointment

## 2023-02-01 ENCOUNTER — Ambulatory Visit (INDEPENDENT_AMBULATORY_CARE_PROVIDER_SITE_OTHER): Payer: Medicaid Other | Admitting: Family Medicine

## 2023-02-14 ENCOUNTER — Ambulatory Visit (INDEPENDENT_AMBULATORY_CARE_PROVIDER_SITE_OTHER): Payer: Medicaid Other | Admitting: Family Medicine

## 2023-02-15 ENCOUNTER — Encounter: Payer: Self-pay | Admitting: Nurse Practitioner

## 2023-02-15 ENCOUNTER — Other Ambulatory Visit: Payer: Self-pay | Admitting: Nurse Practitioner

## 2023-02-15 ENCOUNTER — Other Ambulatory Visit: Payer: Self-pay | Admitting: Physician Assistant

## 2023-02-15 ENCOUNTER — Other Ambulatory Visit: Payer: Self-pay

## 2023-02-15 ENCOUNTER — Ambulatory Visit: Payer: Medicaid Other | Admitting: Nurse Practitioner

## 2023-02-15 VITALS — BP 128/72 | HR 100 | Temp 98.1°F | Resp 16 | Ht 65.0 in | Wt 350.8 lb

## 2023-02-15 DIAGNOSIS — F330 Major depressive disorder, recurrent, mild: Secondary | ICD-10-CM

## 2023-02-15 DIAGNOSIS — Z6841 Body Mass Index (BMI) 40.0 and over, adult: Secondary | ICD-10-CM

## 2023-02-15 DIAGNOSIS — E66813 Obesity, class 3: Secondary | ICD-10-CM | POA: Insufficient documentation

## 2023-02-15 DIAGNOSIS — F419 Anxiety disorder, unspecified: Secondary | ICD-10-CM | POA: Diagnosis not present

## 2023-02-15 DIAGNOSIS — G8929 Other chronic pain: Secondary | ICD-10-CM

## 2023-02-15 MED ORDER — CYCLOBENZAPRINE HCL 10 MG PO TABS
5.0000 mg | ORAL_TABLET | Freq: Three times a day (TID) | ORAL | 0 refills | Status: DC | PRN
  Filled 2023-02-15: qty 30, 10d supply, fill #0

## 2023-02-15 MED ORDER — SERTRALINE HCL 25 MG PO TABS
25.0000 mg | ORAL_TABLET | Freq: Every day | ORAL | 0 refills | Status: DC
  Filled 2023-02-15: qty 30, 30d supply, fill #0

## 2023-02-15 MED ORDER — TRAZODONE HCL 50 MG PO TABS
25.0000 mg | ORAL_TABLET | Freq: Every evening | ORAL | 0 refills | Status: DC | PRN
  Filled 2023-02-15: qty 30, 30d supply, fill #0

## 2023-02-15 MED ORDER — TIRZEPATIDE-WEIGHT MANAGEMENT 2.5 MG/0.5ML ~~LOC~~ SOAJ
2.5000 mg | SUBCUTANEOUS | 0 refills | Status: DC
  Filled 2023-02-15 (×2): qty 2, 28d supply, fill #0

## 2023-02-15 MED ORDER — SERTRALINE HCL 50 MG PO TABS
50.0000 mg | ORAL_TABLET | Freq: Every day | ORAL | 0 refills | Status: DC
  Filled 2023-02-15: qty 30, 30d supply, fill #0

## 2023-02-15 NOTE — Progress Notes (Signed)
 BP 128/72 (Cuff Size: Large)   Pulse 100   Temp 98.1 F (36.7 C) (Oral)   Resp 16   Ht 5' 5 (1.651 m)   Wt (!) 350 lb 12.8 oz (159.1 kg)   SpO2 96%   BMI 58.38 kg/m    Subjective:    Patient ID: Latoya Stark, female    DOB: [birth year], 28 y.o.   MRN: [id number]  HPI: Latoya Stark is a 28 y.o. female  Chief Complaint  Patient presents with   Anxiety    4 week recheck    Discussed the use of AI scribe software for clinical note transcription with the patient, who gave verbal consent to proceed.  History of Present Illness   The patient, with a history of anxiety and depression, has been on Zoloft  50mg  daily and hydroxyzine  at bedtime. The hydroxyzine  was prescribed to help with nocturnal panic attacks and sleep. She reports that the hydroxyzine  has helped with nighttime anxiety, but she still has trouble sleeping due to frequent waking and difficulty falling back asleep. She has previously tried multiple other medications including Focalin and Ritalin. She feels that the Zoloft  has been helping and would like to increase the dose.  In addition to her mental health concerns, the patient is also struggling with weight loss. She has made dietary changes including cutting out starch, bread, fried food, and sweets, and limiting soda intake. She has tried metformin  in the past, but it made her feel horrible. She has been researching other medications for weight loss and has heard positive things about Mounjaro  and Ozempic . She is interested in trying a new medication to assist with weight loss.       02/15/2023    7:39 AM 01/18/2023    7:31 AM 11/24/2022    1:04 PM  Depression screen PHQ 2/9  Decreased Interest 1 1 1   Down, Depressed, Hopeless 0 0 2  PHQ - 2 Score 1 1 3   Altered sleeping 3 3 3   Tired, decreased energy 1 1 0  Change in appetite 0 1 1  Feeling bad or failure about yourself  1 0 0  Trouble concentrating 0 0 1  Moving slowly or fidgety/restless 0 3 1  Suicidal  thoughts 0 0 0  PHQ-9 Score 6 9 9   Difficult doing work/chores Somewhat difficult  Somewhat difficult    Relevant past medical, surgical, family and social history reviewed and updated as indicated. Interim medical history since our last visit reviewed. Allergies and medications reviewed and updated.  Review of Systems Constitutional: Negative for fever or weight change.  Respiratory: Negative for cough and shortness of breath.   Cardiovascular: Negative for chest pain or palpitations.  Gastrointestinal: Negative for abdominal pain, no bowel changes.  Musculoskeletal: Negative for gait problem or joint swelling.  Skin: Negative for rash.  Neurological: Negative for dizziness or headache.  No other specific complaints in a complete review of systems (except as listed in HPI above).      Objective:    BP 128/72 (Cuff Size: Large)   Pulse 100   Temp 98.1 F (36.7 C) (Oral)   Resp 16   Ht 5' 5 (1.651 m)   Wt (!) 350 lb 12.8 oz (159.1 kg)   SpO2 96%   BMI 58.38 kg/m    Wt Readings from Last 3 Encounters:  02/15/23 (!) 350 lb 12.8 oz (159.1 kg)  01/18/23 (!) 344 lb 3.2 oz (156.1 kg)  12/13/22 (!) 340 lb (154.2  kg)    Physical Exam Vitals reviewed.  Constitutional:      Appearance: Normal appearance.  HENT:     Head: Normocephalic.  Cardiovascular:     Rate and Rhythm: Normal rate and regular rhythm.  Pulmonary:     Effort: Pulmonary effort is normal.     Breath sounds: Normal breath sounds.  Musculoskeletal:        General: Normal range of motion.  Skin:    General: Skin is warm and dry.  Neurological:     General: No focal deficit present.     Mental Status: She is alert and oriented to person, place, and time. Mental status is at baseline.  Psychiatric:        Mood and Affect: Mood normal.        Behavior: Behavior normal.        Thought Content: Thought content normal.        Judgment: Judgment normal.     Results for orders placed or performed in visit on  12/13/22  CBC   Collection Time: 12/13/22 11:10 AM  Result Value Ref Range   WBC 6.2 3.4 - 10.8 x10E3/uL   RBC 4.19 3.77 - 5.28 x10E6/uL   Hemoglobin 12.0 11.1 - 15.9 g/dL   Hematocrit 64.1 65.9 - 46.6 %   MCV 85 79 - 97 fL   MCH 28.6 26.6 - 33.0 pg   MCHC 33.5 31.5 - 35.7 g/dL   RDW 87.1 88.2 - 84.5 %   Platelets 400 150 - 450 x10E3/uL       Assessment & Plan:   Problem List Items Addressed This Visit       Other   Mild episode of recurrent major depressive disorder (HCC)   Relevant Medications   traZODone  (DESYREL ) 50 MG tablet   sertraline  (ZOLOFT ) 50 MG tablet   sertraline  (ZOLOFT ) 25 MG tablet   Anxiety   Relevant Medications   traZODone  (DESYREL ) 50 MG tablet   sertraline  (ZOLOFT ) 50 MG tablet   sertraline  (ZOLOFT ) 25 MG tablet   Class 3 severe obesity due to excess calories with serious comorbidity and body mass index (BMI) of 50.0 to 59.9 in adult [facility]) - Primary   Relevant Medications   tirzepatide  (ZEPBOUND ) 2.5 MG/0.5ML injection vial     Assessment and Plan    Anxiety and Depression Partial response to Zoloft  50mg  daily and Hydroxyzine  at bedtime. Nocturnal panic attacks have improved but still experiencing sleep disturbances. -Increase Zoloft  to 75mg  daily (50mg  + 25mg  tablets). -Add Trazodone  at bedtime for sleep, discontinue Hydroxyzine   Obesity Patient has made dietary changes but is struggling with weight loss. No response to Metformin  and patient reports adverse effects. Patient interested in pharmacotherapy for weight loss. -Start Zepbound   once weekly injection for weight loss. -denies family history of thyroid  cancer,  or personal history of pancreatitis -Obtain prior authorization for Zepbound  from insurance. If denied, consider alternative options. -comorbidities include depression and anxiety  Follow-up in 4 weeks to assess response to medication changes.        Follow up plan: Return in about 4 weeks (around 03/15/2023) for follow  up.

## 2023-02-16 ENCOUNTER — Other Ambulatory Visit: Payer: Self-pay

## 2023-02-19 ENCOUNTER — Other Ambulatory Visit: Payer: Self-pay | Admitting: Nurse Practitioner

## 2023-02-19 MED ORDER — WEGOVY 0.25 MG/0.5ML ~~LOC~~ SOAJ: 0.2500 mg | SUBCUTANEOUS | 0 refills | Status: DC

## 2023-02-20 ENCOUNTER — Other Ambulatory Visit: Payer: Self-pay | Admitting: Nurse Practitioner

## 2023-02-20 ENCOUNTER — Other Ambulatory Visit: Payer: Self-pay

## 2023-02-20 DIAGNOSIS — F330 Major depressive disorder, recurrent, mild: Secondary | ICD-10-CM

## 2023-02-20 DIAGNOSIS — F419 Anxiety disorder, unspecified: Secondary | ICD-10-CM

## 2023-02-21 NOTE — Telephone Encounter (Signed)
Refill for 90 days.  Requested Prescriptions  Pending Prescriptions Disp Refills   hydrOXYzine (VISTARIL) 25 MG capsule [Pharmacy Med Name: HYDROXYZINE PAM 25 MG CAP] 90 capsule 0    Sig: TAKE 1 CAPSULE (25 MG TOTAL) BY MOUTH AT BEDTIME AS NEEDED FOR ANXIETY.     Ear, Nose, and Throat:  Antihistamines 2 Passed - 02/21/2023  9:30 AM      Passed - Cr in normal range and within 360 days    Creatinine, Ser  Date Value Ref Range Status  11/17/2022 0.72 0.44 - 1.00 mg/dL Final   Creatinine, Urine  Date Value Ref Range Status  05/18/2021 52 mg/dL Final         Passed - Valid encounter within last 12 months    Recent Outpatient Visits           1 month ago Anxiety   [facility] Berniece Salines, FNP   2 months ago History of seizure   [facility] Berniece Salines, FNP   3 years ago Blood pressure check   [place] [facility] Grayce Sessions, NP   3 years ago Encounter to establish care   [place] [facility] Grayce Sessions, NP       Future Appointments             In 5 days Langston Reusing, MD [facility]

## 2023-02-26 ENCOUNTER — Ambulatory Visit (INDEPENDENT_AMBULATORY_CARE_PROVIDER_SITE_OTHER): Payer: Medicaid Other | Admitting: Family Medicine

## 2023-03-12 ENCOUNTER — Ambulatory Visit (INDEPENDENT_AMBULATORY_CARE_PROVIDER_SITE_OTHER): Payer: Medicaid Other | Admitting: Family Medicine

## 2023-03-12 NOTE — Progress Notes (Unsigned)
   There were no vitals taken for this visit.   Subjective:    Patient ID: Latoya Stark, female    DOB: 11-28-1995, 28 y.o.   MRN: [id number]  HPI: Latoya Stark is a 28 y.o. female  No chief complaint on file.   Discussed the use of AI scribe software for clinical note transcription with the patient, who gave verbal consent to proceed.  History of Present Illness           02/15/2023    7:39 AM 01/18/2023    7:31 AM 11/24/2022    1:04 PM  Depression screen PHQ 2/9  Decreased Interest 1 1 1   Down, Depressed, Hopeless 0 0 2  PHQ - 2 Score 1 1 3   Altered sleeping 3 3 3   Tired, decreased energy 1 1 0  Change in appetite 0 1 1  Feeling bad or failure about yourself  1 0 0  Trouble concentrating 0 0 1  Moving slowly or fidgety/restless 0 3 1  Suicidal thoughts 0 0 0  PHQ-9 Score 6 9 9   Difficult doing work/chores Somewhat difficult  Somewhat difficult    Relevant past medical, surgical, family and social history reviewed and updated as indicated. Interim medical history since our last visit reviewed. Allergies and medications reviewed and updated.  Review of Systems  Per HPI unless specifically indicated above     Objective:    There were no vitals taken for this visit.  {Vitals History (Optional):23777} Wt Readings from Last 3 Encounters:  02/15/23 (!) 350 lb 12.8 oz (159.1 kg)  01/18/23 (!) 344 lb 3.2 oz (156.1 kg)  12/13/22 (!) 340 lb (154.2 kg)    Physical Exam  Results for orders placed or performed in visit on 12/13/22  CBC   Collection Time: 12/13/22 11:10 AM  Result Value Ref Range   WBC 6.2 3.4 - 10.8 x10E3/uL   RBC 4.19 3.77 - 5.28 x10E6/uL   Hemoglobin 12.0 11.1 - 15.9 g/dL   Hematocrit 78.2 95.6 - 46.6 %   MCV 85 79 - 97 fL   MCH 28.6 26.6 - 33.0 pg   MCHC 33.5 31.5 - 35.7 g/dL   RDW 21.3 08.6 - 57.8 %   Platelets 400 150 - 450 x10E3/uL   {Labs (Optional):23779}    Assessment & Plan:   Problem List Items Addressed This Visit   None     Assessment and Plan             Follow up plan: No follow-ups on file.

## 2023-03-13 ENCOUNTER — Other Ambulatory Visit: Payer: Self-pay

## 2023-03-13 ENCOUNTER — Encounter: Payer: Self-pay | Admitting: Nurse Practitioner

## 2023-03-13 ENCOUNTER — Ambulatory Visit: Admitting: Nurse Practitioner

## 2023-03-13 VITALS — BP 130/80 | HR 82 | Temp 98.0°F | Resp 16 | Ht 65.0 in | Wt 343.6 lb

## 2023-03-13 DIAGNOSIS — F330 Major depressive disorder, recurrent, mild: Secondary | ICD-10-CM | POA: Diagnosis not present

## 2023-03-13 DIAGNOSIS — H6062 Unspecified chronic otitis externa, left ear: Secondary | ICD-10-CM

## 2023-03-13 DIAGNOSIS — F419 Anxiety disorder, unspecified: Secondary | ICD-10-CM

## 2023-03-13 DIAGNOSIS — H9202 Otalgia, left ear: Secondary | ICD-10-CM | POA: Diagnosis not present

## 2023-03-13 DIAGNOSIS — Z6841 Body Mass Index (BMI) 40.0 and over, adult: Secondary | ICD-10-CM

## 2023-03-13 MED ORDER — SEMAGLUTIDE-WEIGHT MANAGEMENT 0.5 MG/0.5ML ~~LOC~~ SOAJ: 0.5000 mg | SUBCUTANEOUS | 0 refills | Status: AC

## 2023-03-13 MED ORDER — SEMAGLUTIDE-WEIGHT MANAGEMENT 2.4 MG/0.75ML ~~LOC~~ SOAJ
2.4000 mg | SUBCUTANEOUS | 5 refills | Status: DC
  Filled 2023-06-15: qty 3, fill #0

## 2023-03-13 MED ORDER — SEMAGLUTIDE-WEIGHT MANAGEMENT 1.7 MG/0.75ML ~~LOC~~ SOAJ: 1.7000 mg | SUBCUTANEOUS | 0 refills | Status: DC

## 2023-03-13 MED ORDER — SEMAGLUTIDE-WEIGHT MANAGEMENT 1 MG/0.5ML ~~LOC~~ SOAJ: 1.0000 mg | SUBCUTANEOUS | 0 refills | Status: AC

## 2023-03-13 MED ORDER — SERTRALINE HCL 25 MG PO TABS
25.0000 mg | ORAL_TABLET | Freq: Every day | ORAL | 1 refills | Status: DC
  Filled 2023-03-13: qty 90, 90d supply, fill #0
  Filled 2023-06-15: qty 90, 90d supply, fill #1

## 2023-03-13 MED ORDER — OFLOXACIN 0.3 % OT SOLN
10.0000 [drp] | Freq: Every day | OTIC | 0 refills | Status: DC
  Filled 2023-03-13: qty 10, 20d supply, fill #0

## 2023-03-13 MED ORDER — SERTRALINE HCL 50 MG PO TABS
50.0000 mg | ORAL_TABLET | Freq: Every day | ORAL | 1 refills | Status: DC
  Filled 2023-03-13: qty 90, 90d supply, fill #0
  Filled 2023-06-15: qty 90, 90d supply, fill #1

## 2023-03-22 ENCOUNTER — Telehealth: Payer: Self-pay

## 2023-03-22 ENCOUNTER — Other Ambulatory Visit: Payer: Self-pay

## 2023-03-22 DIAGNOSIS — E66813 Obesity, class 3: Secondary | ICD-10-CM

## 2023-03-22 MED ORDER — WEGOVY 0.25 MG/0.5ML ~~LOC~~ SOAJ
0.2500 mg | SUBCUTANEOUS | 0 refills | Status: DC
  Filled 2023-03-22: qty 2, 28d supply, fill #0

## 2023-03-22 NOTE — Telephone Encounter (Signed)
 Approved on 2/11 rx sent to [facility] pharmacy

## 2023-03-22 NOTE — Addendum Note (Signed)
 Addended by: Davene Costain on: 03/22/2023 01:29 PM   Modules accepted: Orders

## 2023-03-22 NOTE — Telephone Encounter (Signed)
 Copied from CRM [id number]. Topic: Clinical - Prescription Issue >> Mar 22, 2023 12:45 PM Geroge Baseman wrote: Reason for CRM: Patient states her pharmacy is out of her wegovy and she was wanting to see if it can be sent over to [facility]. She said she thinks it may need prior [place].

## 2023-03-26 ENCOUNTER — Telehealth: Payer: Self-pay

## 2023-03-26 ENCOUNTER — Other Ambulatory Visit: Payer: Self-pay | Admitting: Nurse Practitioner

## 2023-03-26 DIAGNOSIS — Z6841 Body Mass Index (BMI) 40.0 and over, adult: Secondary | ICD-10-CM

## 2023-03-26 NOTE — Telephone Encounter (Signed)
 [facility] approved 03/26/23

## 2023-03-26 NOTE — Telephone Encounter (Signed)
-----   Message from [place] M sent at 03/26/2023  4:39 PM EDT ----- Review incoming fax

## 2023-03-27 ENCOUNTER — Encounter: Payer: Self-pay | Admitting: *Deleted

## 2023-03-27 DIAGNOSIS — G40409 Other generalized epilepsy and epileptic syndromes, not intractable, without status epilepticus: Secondary | ICD-10-CM

## 2023-03-27 MED ORDER — LEVETIRACETAM 750 MG PO TABS: 1500.0000 mg | ORAL_TABLET | Freq: Two times a day (BID) | ORAL | 0 refills | Status: DC

## 2023-03-27 MED ORDER — LAMOTRIGINE 200 MG PO TABS: 200.0000 mg | ORAL_TABLET | Freq: Three times a day (TID) | ORAL | 0 refills | Status: DC

## 2023-03-28 NOTE — Telephone Encounter (Signed)
 Duplcate request, last refilled 03/22/23 for 30 days.  Requested Prescriptions  Pending Prescriptions Disp Refills   WEGOVY 0.25 MG/0.5ML SOAJ [Pharmacy Med Name: WEGOVY 0.25 MG/0.5 ML PEN]      Sig: INJECT 0.25MG  INTO THE SKIN ONE TIME PER WEEK     Endocrinology:  Diabetes - GLP-1 Receptor Agonists - semaglutide Failed - 03/28/2023  8:26 AM      Failed - HBA1C in normal range and within 180 days    Hgb A1c MFr Bld  Date Value Ref Range Status  06/07/2021 5.5 4.8 - 5.6 % Final    Comment:    (NOTE) Pre diabetes:          5.7%-6.4%  Diabetes:              >6.4%  Glycemic control for   <7.0% adults with diabetes          Passed - Cr in normal range and within 360 days    Creatinine, Ser  Date Value Ref Range Status  11/17/2022 0.72 0.44 - 1.00 mg/dL Final   Creatinine, Urine  Date Value Ref Range Status  05/18/2021 52 mg/dL Final         Passed - Valid encounter within last 6 months    Recent Outpatient Visits           2 months ago Anxiety   [facility] Berniece Salines, FNP   4 months ago History of seizure   [facility] Berniece Salines, FNP   3 years ago Blood pressure check   [place] [facility] Grayce Sessions, NP   4 years ago Encounter to establish care   [place] [facility] Grayce Sessions, NP       Future Appointments             In 5 months Zane Herald, Rudolpho Sevin, FNP [facility], [facility]

## 2023-04-02 ENCOUNTER — Other Ambulatory Visit: Payer: Self-pay

## 2023-04-11 ENCOUNTER — Other Ambulatory Visit: Payer: Self-pay

## 2023-04-11 DIAGNOSIS — K625 Hemorrhage of anus and rectum: Secondary | ICD-10-CM

## 2023-04-11 DIAGNOSIS — K5909 Other constipation: Secondary | ICD-10-CM

## 2023-04-11 MED ORDER — NA SULFATE-K SULFATE-MG SULF 17.5-3.13-1.6 GM/177ML PO SOLN
354.0000 mL | Freq: Once | ORAL | 0 refills | Status: AC
  Filled 2023-04-11: qty 354, 1d supply, fill #0

## 2023-04-19 ENCOUNTER — Other Ambulatory Visit: Payer: Self-pay | Admitting: Nurse Practitioner

## 2023-04-19 ENCOUNTER — Ambulatory Visit (INDEPENDENT_AMBULATORY_CARE_PROVIDER_SITE_OTHER): Admitting: Family Medicine

## 2023-04-19 ENCOUNTER — Encounter (INDEPENDENT_AMBULATORY_CARE_PROVIDER_SITE_OTHER): Payer: Self-pay | Admitting: Family Medicine

## 2023-04-19 ENCOUNTER — Other Ambulatory Visit: Payer: Self-pay

## 2023-04-19 VITALS — BP 129/88 | HR 85 | Temp 98.3°F | Ht 66.0 in | Wt 338.0 lb

## 2023-04-19 DIAGNOSIS — G8929 Other chronic pain: Secondary | ICD-10-CM

## 2023-04-19 DIAGNOSIS — Z1331 Encounter for screening for depression: Secondary | ICD-10-CM | POA: Diagnosis not present

## 2023-04-19 DIAGNOSIS — Z6841 Body Mass Index (BMI) 40.0 and over, adult: Secondary | ICD-10-CM

## 2023-04-19 DIAGNOSIS — G40909 Epilepsy, unspecified, not intractable, without status epilepticus: Secondary | ICD-10-CM

## 2023-04-19 DIAGNOSIS — R0602 Shortness of breath: Secondary | ICD-10-CM

## 2023-04-19 DIAGNOSIS — M5441 Lumbago with sciatica, right side: Secondary | ICD-10-CM

## 2023-04-19 DIAGNOSIS — R5383 Other fatigue: Secondary | ICD-10-CM

## 2023-04-19 DIAGNOSIS — E66813 Obesity, class 3: Secondary | ICD-10-CM

## 2023-04-19 DIAGNOSIS — J4530 Mild persistent asthma, uncomplicated: Secondary | ICD-10-CM | POA: Diagnosis not present

## 2023-04-19 DIAGNOSIS — M5442 Lumbago with sciatica, left side: Secondary | ICD-10-CM

## 2023-04-19 DIAGNOSIS — F39 Unspecified mood [affective] disorder: Secondary | ICD-10-CM | POA: Diagnosis not present

## 2023-04-19 NOTE — Progress Notes (Signed)
 Carlye Grippe, D.O.  ABFM, ABOM Specializing in Clinical Bariatric Medicine Office located at: [address] [place], [place]  [address]   Bariatric Medicine Visit  Dear Berniece Salines, FNP   Thank you for referring Jerrye Bushy to our clinic today for evaluation.  We performed a consultation to discuss her options for treatment and educate the patient on her disease state.  The following note includes my evaluation and treatment recommendations.   Please do not hesitate to reach out to me directly if you have any further concerns.   Assessment and Plan:   Orders Placed This Encounter  Procedures   CBC with Differential/Platelet   Hemoglobin A1c   Comprehensive metabolic panel with GFR   Insulin, random   Lipid panel   TSH   VITAMIN D 25 Hydroxy (Vit-D Deficiency, Fractures)   Folate   T4, free   Vitamin B12   EKG 12-Lead    FOR THE DISEASE OF OBESITY:  Class 3 severe obesity due to excess calories with serious comorbidity and body mass index (BMI) of 50.0 to 59.9 in adult [facility]) Assessment & Plan: Rosangela is currently in the action stage of change. As such, her goal is to start our weight management plan.  She has agreed to: follow the Category 3 plan with only 200 snack calories.   Behavioral Intervention We discussed the following today: begin to work on maintaining a reduced calorie state, getting the recommended amount of protein, incorporating whole foods, making healthy choices, staying well hydrated and practicing mindfulness when eating.  Additional resources provided today: Handout on CAT 3 meal plan, Handout on CAT 3 sample grocery list.   Evidence-based interventions for health behavior change were utilized today including the discussion of self monitoring techniques, problem-solving barriers and SMART goal setting techniques.    Pt will specifically work on: n/a   Recommended Physical Activity Goals Clarabelle has been advised to work up to 150  minutes of moderate intensity aerobic activity a week and strengthening exercises 2-3 times per week for cardiovascular health, weight loss maintenance and preservation of muscle mass.   She has agreed to : maintain current level of activity.    Pharmacotherapy We both agreed to: begin with nutritional and behavioral strategies.   FOR ASSOCIATED CONDITIONS ADDRESSED TODAY:   Fatigue Assessment & Plan: Eugene does feel that her weight is causing her energy to be lower than it should be. Fatigue may be related to obesity, depression or many other causes. she does not appear to have any red flag symptoms and this appears to most likely be related to her current lifestyle habits and dietary intake.  Labs will be ordered and reviewed with her at their next office visit in two weeks.  Epworth sleepiness scale is 3 and appears to be within normal limits. Lastacia admits to occasional daytime somnolence and denies waking up still tired. Patient has a history of morning headaches (5x per week) Axel generally gets 6 hours of sleep per night, and states that she has generally restful sleep. Snoring is present. Apneic episodes is not present. Encouraged pt to aim for 7-9 hrs of sleep per night for the best weight loss results and for her overall health/well being.   ECG: Performed and reviewed/ interpreted independently.  Normal sinus rhythm, rate 83 bpm; reassuring without any acute abnormalities, will continue to monitor for symptoms.    Shortness of breath on exertion Assessment & Plan: Delanie does feel that she gets  out of breath more easily than she used to when she exercises and seems to be worsening over time with weight gain.  This has gotten worse recently. Raynelle denies shortness of breath at rest or orthopnea. Pt denies chest pain, dizziness, heart palpitations, or excessive diaphoresis or nausea with activity.  This is not new and is ongoing.  Cieanna's shortness of breath appears to be  obesity related and exercise induced, as they do not appear to have any "red flag" symptoms/ concerns today.  Also, this condition appears to be related to a state of poor cardiovascular conditioning   Obtain labs today and will be reviewed with her at their next office visit in two weeks.  Indirect Calorimeter completed today to help guide our dietary regimen. It shows a VO2 of 344 and a REE of 2376.  Her calculated basal metabolic rate is 1610 thus her resting energy expenditure is worse than expected.  Patient agreed to work on weight loss at this time.  As Jini progresses through our weight loss program, we will gradually increase exercise as tolerated to treat her current condition.   If Reveca follows our recommendations and loses 5-10% of their weight without improvement of her shortness of breath or if at any time, symptoms become more concerning, they agree to urgently follow up with their PCP/ specialist for further consideration/ evaluation.   Celestia verbalizes agreement with this plan.    Mild persistent asthma without complication Assessment & Plan: Chronic issue. Relevant medications: Symbicort and Albuterol. Condition is well controlled except during the allergy season. Currently she has been using her Albuterol every other day - during the non-allergy season she uses it every 6 months or so. Pt advised to discuss with PCP about possibly starting Singulair. Begin low-inflammatory meal plan.    Seizure disorder [facility]) Assessment & Plan: Managed by Dr.Sater and Shawnie Dapper NP. Reports compliance with Lamictal and Kepra. Overall condition is well controlled - her most recent seizure was last year. She has no acute concerns today. Continue treatment per neurology.    Depression Screen  Assessment & Plan: Her Food and Mood score was 9. Denies SI/HI. States that she sometimes won't eat when sad but overall does not feel emotional eating is an issue for her. Will continue to monitor -  she was informed about Dr.Barker.    Mood disorder (HCC) Assessment & Plan: No medicines. Pt has a hx of anxiety and depression. States she has felt more depressed lately d/t the unexpected passing away of her fiance in February. Highly encouraged pt to obtain a counselor to help her through this grief. Reminded patient of the importance of following their prudent nutrition plan and how food can affect mood as well to support emotional wellbeing.    Chronic midline low back pain with bilateral sciatica Assessment & Plan: Onset: "8-9 years ago" per pt. Condition managed by Dr.Jacobs of [facility]. On Neurontin (recently started 2 mos ago or so) and Flexeril (has been on for many years). Has tried PT in the past but did not find it effective. Discussed that losing weight and increasing exercise as able can improve her back pain. Continue treatment per specialist.   FOLLOW UP:   Follow up in 2 weeks. She was informed of the importance of frequent follow up visits to maximize her success with intensive lifestyle modifications for her multiple health conditions.  ARLA BOUTWELL is aware that we will review all of her lab results at our next visit.  She is aware that if anything is critical/ life threatening with the results, we will be contacting her via MyChart prior to the office visit to discuss management.    Chief Complaint:   OBESITY KALYN HOFSTRA (MR# [id number]) is a pleasant 28 y.o. female who presents for evaluation and treatment of obesity and related comorbidities. Current BMI is Body mass index is 54.55 kg/m. MILANNI AYUB has been struggling with her weight for many years and has been unsuccessful in either losing weight, maintaining weight loss, or reaching her healthy weight goal.  Percell Boston Cartaya is currently in the action stage of change and ready to dedicate time achieving and maintaining a healthier weight. RYAH CRIBB is interested in becoming our patient and working  on intensive lifestyle modifications including (but not limited to) diet and exercise for weight loss.  LEVADA BOWERSOX works 40 hrs a week in the [employer] at [employer]. Patient is single and lives with her 73 sons (10 year old and 71 year old).   Desires to be 200 lbs at some point.   Exercises 30 minutes, 2 days a week on average.   Been on the Keto and "High Protein, Low Carb" diet in the past. Lost 6 lbs total on these plans.   Eats fast food or take out at work 5+ days a week.   Craves chicken.  Snacks on crackers.  Skips breakfast "all the time".  Has 3-4 regular sodas a day & coffee with creamer + sugar.  Worst food habit in the past 2 months: crackers and soda.   Subjective:   This is the patient's first visit at [facility].  The patient's NEW PATIENT PACKET that they filled out prior to today's office visit was reviewed at length and information from that paperwork was included within the following office visit note.    Included in the packet: current and past health history, medications, allergies, ROS, gynecologic history (women only), surgical history, family history, social history, weight history, weight loss surgery history (for those that have had weight loss surgery), nutritional evaluation, mood and food questionnaire along with a depression screening (PHQ9) on all patients, an Epworth questionnaire, sleep habits questionnaire, patient life and health improvement goals questionnaire. These will all be scanned into the patient's chart under the "media" tab.   Review of Systems: Please refer to new patient packet scanned into media. Pertinent positives were addressed with patient today.  Reviewed by clinician on day of visit: allergies, medications, problem list, medical history, surgical history, family history, social history, and previous encounter notes.  During the visit, I independently reviewed the patient's EKG, bioimpedance scale results, and  indirect calorimeter results. I used this information to tailor a meal plan for the patient that will help Annaka L Pat to lose weight and will improve her obesity-related conditions going forward.  I performed a medically necessary appropriate examination and/or evaluation. I discussed the assessment and treatment plan with the patient. The patient was provided an opportunity to ask questions and all were answered. The patient agreed with the plan and demonstrated an understanding of the instructions. Labs were ordered today (unless patient declined them) and will be reviewed with the patient at our next visit unless more critical results need to be addressed immediately. Clinical information was updated and documented in the EMR.   Objective:   PHYSICAL EXAM: Blood pressure 129/88, pulse 85, temperature 98.3 F (36.8 C), height 5\' 6"  (1.676 m), weight [facility] Kitchen)  338 lb (153.3 kg), last menstrual period 02/20/2023, SpO2 93%, not currently breastfeeding. Body mass index is 54.55 kg/m.  General: Well Developed, well nourished, and in no acute distress.  HEENT: Normocephalic, atraumatic; EOMI, sclerae are anicteric. Skin: Warm and dry, good turgor Chest:  Normal excursion, shape, no gross ABN Respiratory: No conversational dyspnea; speaking in full sentences NeuroM-Sk:  Normal gross ROM * 4 extremities  Psych: A and O *3, insight adequate, mood- full   Anthropometric Measurements Height: 5\' 6"  (1.676 m) Weight: (!) 338 lb (153.3 kg) BMI (Calculated): 54.58 Weight at Last Visit: N/A Weight Lost Since Last Visit: N/A Weight Gained Since Last Visit: N/A Starting Weight: 338 lb Waist Measurement : 56 inches   Body Composition  Body Fat %: 51.4 % Fat Mass (lbs): 173.8 lbs Muscle Mass (lbs): 156 lbs Total Body Water (lbs): 108.8 lbs Visceral Fat Rating : 18   Other Clinical Data RMR: 2376 Fasting: Yes Labs: Yes Today's Visit #: 1 Starting Date: 04/19/23    DIAGNOSTIC DATA  REVIEWED:  BMET    Component Value Date/Time   NA 135 11/17/2022 1031   NA 137 12/30/2020 1153   K 3.4 (L) 11/17/2022 1031   CL 104 11/17/2022 1031   CO2 20 (L) 11/17/2022 1031   GLUCOSE 104 (H) 11/17/2022 1031   BUN 7 11/17/2022 1031   BUN 5 (L) 12/30/2020 1153   CREATININE 0.72 11/17/2022 1031   CALCIUM 9.0 11/17/2022 1031   GFRNONAA >60 11/17/2022 1031   GFRAA >60 02/20/2019 2329   Lab Results  Component Value Date   HGBA1C 5.5 06/07/2021   HGBA1C  12/13/2006    5.6 (NOTE)   The ADA recommends the following therapeutic goals for glycemic   control related to Hgb A1C measurement:   Goal of Therapy:   < 7.0% Hgb A1C   Action Suggested:  > 8.0% Hgb A1C   Ref:  Diabetes Care, 22, Suppl. 1, 1999   No results found for: "INSULIN" Lab Results  Component Value Date   TSH 1.430 09/26/2022   CBC    Component Value Date/Time   WBC 6.2 12/13/2022 1110   WBC 9.5 11/17/2022 1031   RBC 4.19 12/13/2022 1110   RBC 4.15 11/17/2022 1031   HGB 12.0 12/13/2022 1110   HCT 35.8 12/13/2022 1110   PLT 400 12/13/2022 1110   MCV 85 12/13/2022 1110   MCH 28.6 12/13/2022 1110   MCH 28.2 11/17/2022 1031   MCHC 33.5 12/13/2022 1110   MCHC 32.9 11/17/2022 1031   RDW 12.8 12/13/2022 1110   Iron Studies    Component Value Date/Time   IRON 115 02/24/2014 1222   IRONPCTSAT 24.5 02/24/2014 1222   Lipid Panel     Component Value Date/Time   CHOL  12/13/2006 0630    122        ATP III CLASSIFICATION:  <200     mg/dL   Desirable  409-811  mg/dL   Borderline High  >=914    mg/dL   High   TRIG 78 78/29/5621 0630   HDL 30 (L) 12/13/2006 0630   CHOLHDL 4.1 12/13/2006 0630   VLDL 16 12/13/2006 0630   LDLCALC  12/13/2006 0630    76        Total Cholesterol/HDL:CHD Risk Coronary Heart Disease Risk Table                     Men   Women  1/2 Average Risk   3.4  3.3   Hepatic Function Panel     Component Value Date/Time   PROT 6.5 05/26/2021 1637   PROT 6.8 12/30/2020 1153   ALBUMIN  3.1 (L) 05/26/2021 1637   ALBUMIN 4.1 12/30/2020 1153   AST 22 05/26/2021 1637   ALT 26 05/26/2021 1637   ALKPHOS 117 05/26/2021 1637   BILITOT 0.3 05/26/2021 1637   BILITOT <0.2 12/30/2020 1153      Component Value Date/Time   TSH 1.430 09/26/2022 1259   Nutritional Lab Results  Component Value Date   VD25OH 27 (L) 07/30/2013   Attestation Statements:   I, Special Puri, acting as a Stage manager for Thomasene Lot, DO., have compiled all relevant documentation for today's office visit on behalf of Thomasene Lot, DO, while in the presence of [employer], DO.  I have reviewed the above documentation for accuracy and completeness, and I agree with the above. Carlye Grippe, D.O.  The 21st Century Cures Act was signed into law in 2016 which includes the topic of electronic health records.  This provides immediate access to information in MyChart.  This includes consultation notes, operative notes, office notes, lab results and pathology reports.  If you have any questions about what you read please let us know at your next visit so we can discuss your concerns and take corrective action if need be.  We are right here with you.

## 2023-04-20 ENCOUNTER — Other Ambulatory Visit: Payer: Self-pay

## 2023-04-20 NOTE — Telephone Encounter (Signed)
 Requested medication (s) are due for refill today: na   Requested medication (s) are on the active medication list: yes   Last refill:  03/22/23 #53ml 0 refills   Future visit scheduled: yes in 4 months   Notes to clinic:  do you want to refill same dose Rx?     Requested Prescriptions  Pending Prescriptions Disp Refills   WEGOVY 0.25 MG/0.5ML SOAJ [Pharmacy Med Name: Semaglutide-Weight Management (WEGOVY) 0.25 MG/0.5ML Solution Auto-injector] 2 mL 0    Sig: Inject 0.25 mg into the skin once a week.     Endocrinology:  Diabetes - GLP-1 Receptor Agonists - semaglutide Failed - 04/20/2023 11:46 AM      Failed - HBA1C in normal range and within 180 days    Hgb A1c MFr Bld  Date Value Ref Range Status  04/19/2023 5.8 (H) 4.8 - 5.6 % Final    Comment:             Prediabetes: 5.7 - 6.4          Diabetes: >6.4          Glycemic control for adults with diabetes: <7.0          Passed - Cr in normal range and within 360 days    Creatinine, Ser  Date Value Ref Range Status  04/19/2023 0.82 0.57 - 1.00 mg/dL Final   Creatinine, Urine  Date Value Ref Range Status  05/18/2021 52 mg/dL Final         Passed - Valid encounter within last 6 months    Recent Outpatient Visits           1 month ago Left ear pain   [place] [facility] Berniece Salines, FNP   2 months ago Class 3 severe obesity due to excess calories with serious comorbidity and body mass index (BMI) of 50.0 to 59.9 in adult [facility])   [facility] Berniece Salines, FNP   4 years ago Blood pressure check   [place] [facility] Grayce Sessions, NP   4 years ago Encounter to establish care   [place] [facility] Grayce Sessions, NP       Future Appointments             In 4 months Zane Herald, Rudolpho Sevin, FNP [facility], [facility]

## 2023-04-24 ENCOUNTER — Other Ambulatory Visit: Payer: Self-pay | Admitting: Nurse Practitioner

## 2023-04-24 DIAGNOSIS — E66813 Obesity, class 3: Secondary | ICD-10-CM

## 2023-04-24 LAB — INSULIN, RANDOM

## 2023-04-24 LAB — CBC WITH DIFFERENTIAL/PLATELET
Basophils Absolute: 0 10*3/uL (ref 0.0–0.2)
Basos: 0 %
EOS (ABSOLUTE): 0.1 10*3/uL (ref 0.0–0.4)
Eos: 1 %
Hematocrit: 38.2 % (ref 34.0–46.6)
Hemoglobin: 12.9 g/dL (ref 11.1–15.9)
Immature Grans (Abs): 0 10*3/uL (ref 0.0–0.1)
Immature Granulocytes: 0 %
Lymphocytes Absolute: 1.5 10*3/uL (ref 0.7–3.1)
Lymphs: 24 %
MCH: 28.2 pg (ref 26.6–33.0)
MCHC: 33.8 g/dL (ref 31.5–35.7)
MCV: 83 fL (ref 79–97)
Monocytes Absolute: 0.5 10*3/uL (ref 0.1–0.9)
Monocytes: 7 %
Neutrophils Absolute: 4.2 10*3/uL (ref 1.4–7.0)
Neutrophils: 68 %
Platelets: 420 10*3/uL (ref 150–450)
RBC: 4.58 x10E6/uL (ref 3.77–5.28)
RDW: 12.9 % (ref 11.7–15.4)
WBC: 6.4 10*3/uL (ref 3.4–10.8)

## 2023-04-24 LAB — LIPID PANEL
Chol/HDL Ratio: 3.3 ratio (ref 0.0–4.4)
Cholesterol, Total: 163 mg/dL (ref 100–199)
HDL: 49 mg/dL (ref >39)
LDL Chol Calc (NIH): 100 mg/dL — ABNORMAL HIGH (ref 0–99)
Triglycerides: 70 mg/dL (ref 0–149)
VLDL Cholesterol Cal: 14 mg/dL (ref 5–40)

## 2023-04-24 LAB — COMPREHENSIVE METABOLIC PANEL WITH GFR
ALT: 23 IU/L (ref 0–32)
AST: 19 IU/L (ref 0–40)
Albumin: 4.6 g/dL (ref 4.0–5.0)
Alkaline Phosphatase: 124 IU/L — ABNORMAL HIGH (ref 44–121)
BUN/Creatinine Ratio: 11 (ref 9–23)
BUN: 9 mg/dL (ref 6–20)
Bilirubin Total: 0.3 mg/dL (ref 0.0–1.2)
CO2: 24 mmol/L (ref 20–29)
Calcium: 9.5 mg/dL (ref 8.7–10.2)
Chloride: 98 mmol/L (ref 96–106)
Creatinine, Ser: 0.82 mg/dL (ref 0.57–1.00)
Globulin, Total: 2.8 g/dL (ref 1.5–4.5)
Glucose: 65 mg/dL — ABNORMAL LOW (ref 70–99)
Potassium: 4.4 mmol/L (ref 3.5–5.2)
Sodium: 137 mmol/L (ref 134–144)
Total Protein: 7.4 g/dL (ref 6.0–8.5)
eGFR: 100 mL/min/{1.73_m2} (ref >59)

## 2023-04-24 LAB — VITAMIN B12: Vitamin B-12: 269 pg/mL (ref 232–1245)

## 2023-04-24 LAB — HEMOGLOBIN A1C
Est. average glucose Bld gHb Est-mCnc: 120 mg/dL
Hgb A1c MFr Bld: 5.8 % — ABNORMAL HIGH (ref 4.8–5.6)

## 2023-04-24 LAB — T4, FREE: Free T4: 1.08 ng/dL (ref 0.82–1.77)

## 2023-04-24 LAB — TSH: TSH: 1.15 u[IU]/mL (ref 0.450–4.500)

## 2023-04-24 LAB — FOLATE: Folate: 6.3 ng/mL (ref >3.0)

## 2023-04-24 LAB — VITAMIN D 25 HYDROXY (VIT D DEFICIENCY, FRACTURES): Vit D, 25-Hydroxy: 15.3 ng/mL — ABNORMAL LOW (ref 30.0–100.0)

## 2023-04-24 NOTE — Telephone Encounter (Signed)
 Copied from CRM [id number]. Topic: Clinical - Medication Refill >> Apr 24, 2023  9:04 AM Arminda Landmark wrote: Most Recent Primary Care Visit:  Provider: Quinton Buckler  Department: [facility]  Visit Type: OFFICE VISIT  Date: 03/13/2023  Medication: Semaglutide-Weight Management (WEGOVY) 0.25 MG/0.5ML [place] [[id number]]  Has the patient contacted their pharmacy? Yes (Agent: If no, request that the patient contact the pharmacy for the refill. If patient does not wish to contact the pharmacy document the reason why and proceed with request.) (Agent: If yes, when and what did the pharmacy advise?)  Is this the correct pharmacy for this prescription? Yes If no, delete pharmacy and type the correct one.  This is the patient's preferred pharmacy:  [facility] [address] [place] [address] Phone: [phone] Fax: [phone]   Has the prescription been filled recently? Yes  Is the patient out of the medication? Yes  Has the patient been seen for an appointment in the last year OR does the patient have an upcoming appointment? Yes  Can we respond through MyChart? Yes  Agent: Please be advised that Rx refills may take up to 3 business days. We ask that you follow-up with your pharmacy.  Advised the patient that doctor wont be in until Monday of next week and she understood as well. Patient is also asking for something to be sent for nausea as well.

## 2023-05-01 ENCOUNTER — Telehealth: Payer: Self-pay | Admitting: Nurse Practitioner

## 2023-05-01 NOTE — Telephone Encounter (Unsigned)
 Copied from CRM [id number]. 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

## 2023-05-01 NOTE — Telephone Encounter (Unsigned)
 Copied from CRM [id number]. Topic: Clinical - Prescription Issue >> May 01, 2023  4:27 PM Clyde Darling P wrote: Reason for CRM: Pt would like to be notify the reasoning for the Semaglutide -Weight Management (WEGOVY ) 0.25 MG/0.5ML SOAJ not to be refilled.

## 2023-05-02 ENCOUNTER — Other Ambulatory Visit: Payer: Self-pay | Admitting: Family Medicine

## 2023-05-02 ENCOUNTER — Other Ambulatory Visit: Payer: Self-pay | Admitting: Nurse Practitioner

## 2023-05-02 DIAGNOSIS — G40409 Other generalized epilepsy and epileptic syndromes, not intractable, without status epilepticus: Secondary | ICD-10-CM

## 2023-05-02 DIAGNOSIS — J4530 Mild persistent asthma, uncomplicated: Secondary | ICD-10-CM

## 2023-05-02 MED ORDER — CETIRIZINE HCL 10 MG PO TABS
10.0000 mg | ORAL_TABLET | Freq: Every day | ORAL | 0 refills | Status: DC
  Filled 2023-06-15: qty 30, 30d supply, fill #0

## 2023-05-03 ENCOUNTER — Ambulatory Visit (INDEPENDENT_AMBULATORY_CARE_PROVIDER_SITE_OTHER): Admitting: Family Medicine

## 2023-05-03 MED ORDER — LEVETIRACETAM 750 MG PO TABS
1500.0000 mg | ORAL_TABLET | Freq: Two times a day (BID) | ORAL | 0 refills | Status: DC
  Filled 2023-06-15: qty 360, 90d supply, fill #0

## 2023-05-03 MED ORDER — LAMOTRIGINE 200 MG PO TABS
200.0000 mg | ORAL_TABLET | Freq: Three times a day (TID) | ORAL | 0 refills | Status: DC
  Filled 2023-06-15: qty 270, 90d supply, fill #0

## 2023-05-03 NOTE — Telephone Encounter (Signed)
 Last seen on 10/09/22 per note " She will continue levetiracetam  1500mg  twice daily and lamotrigine  200mg  three times daily. "   Follow up scheduled on 06/12/23

## 2023-05-07 ENCOUNTER — Institutional Professional Consult (permissible substitution): Payer: Medicaid Other | Admitting: Neurology

## 2023-05-09 ENCOUNTER — Other Ambulatory Visit: Payer: Self-pay

## 2023-05-09 ENCOUNTER — Other Ambulatory Visit: Payer: Self-pay | Admitting: Nurse Practitioner

## 2023-05-09 DIAGNOSIS — Z6841 Body Mass Index (BMI) 40.0 and over, adult: Secondary | ICD-10-CM

## 2023-05-10 ENCOUNTER — Ambulatory Visit: Admitting: Obstetrics and Gynecology

## 2023-05-10 ENCOUNTER — Other Ambulatory Visit: Payer: Self-pay

## 2023-05-10 ENCOUNTER — Other Ambulatory Visit: Payer: Self-pay | Admitting: Nurse Practitioner

## 2023-05-10 ENCOUNTER — Other Ambulatory Visit: Payer: Self-pay | Admitting: *Deleted

## 2023-05-10 DIAGNOSIS — Z6841 Body Mass Index (BMI) 40.0 and over, adult: Secondary | ICD-10-CM

## 2023-05-10 MED ORDER — GABAPENTIN 100 MG PO CAPS
200.0000 mg | ORAL_CAPSULE | Freq: Every day | ORAL | 0 refills | Status: DC
  Filled 2023-06-15: qty 180, 90d supply, fill #0

## 2023-05-10 NOTE — Telephone Encounter (Signed)
 error

## 2023-05-11 ENCOUNTER — Other Ambulatory Visit: Payer: Self-pay

## 2023-05-14 ENCOUNTER — Ambulatory Visit: Payer: Self-pay

## 2023-05-14 ENCOUNTER — Other Ambulatory Visit: Payer: Self-pay

## 2023-05-14 MED FILL — Semaglutide (Weight Mngmt) Soln Auto-Injector 0.25 MG/0.5ML: SUBCUTANEOUS | 28 days supply | Qty: 2 | Fill #0 | Status: AC

## 2023-05-14 NOTE — Telephone Encounter (Signed)
 Pt notified, it was sent to pharmacy on 5/3 to [facility]

## 2023-05-14 NOTE — Telephone Encounter (Signed)
 Requested Prescriptions  Pending Prescriptions Disp Refills   Semaglutide -Weight Management (WEGOVY ) 0.25 MG/0.5ML SOAJ [Pharmacy Med Name: Semaglutide -Weight Management (WEGOVY ) 0.25 MG/0.5ML Solution Auto-injector] 4 mL 2    Sig: Inject 0.25 mg into the skin once a week.     Endocrinology:  Diabetes - GLP-1 Receptor Agonists - semaglutide  Failed - 05/14/2023  8:35 AM      Failed - HBA1C in normal range and within 180 days    Hgb A1c MFr Bld  Date Value Ref Range Status  04/19/2023 5.8 (H) 4.8 - 5.6 % Final    Comment:             Prediabetes: 5.7 - 6.4          Diabetes: >6.4          Glycemic control for adults with diabetes: <7.0          Passed - Cr in normal range and within 360 days    Creatinine, Ser  Date Value Ref Range Status  04/19/2023 0.82 0.57 - 1.00 mg/dL Final   Creatinine, Urine  Date Value Ref Range Status  05/18/2021 52 mg/dL Final         Passed - Valid encounter within last 6 months    Recent Outpatient Visits           2 months ago Left ear pain   [place] [facility] Quinton Buckler, FNP   2 months ago Class 3 severe obesity due to excess calories with serious comorbidity and body mass index (BMI) of 50.0 to 59.9 in adult [facility])   [facility] Quinton Buckler, FNP   4 years ago Blood pressure check   [place] [facility] Marius Siemens, NP   4 years ago Encounter to establish care    [facility] Marius Siemens, NP       Future Appointments             In 4 months Abram Hoguet, Monalisa Angles, FNP [facility], [facility]

## 2023-05-14 NOTE — Telephone Encounter (Signed)
 Copied from CRM [id number]. Topic: Clinical - Medication Question >> May 14, 2023 11:27 AM Rennis Case wrote: Reason for CRM: Patient requesting clarification on which dosage for the wegovy  she is supposed to be on. Patient has been taking the 1.7mg  dosage but on mychart it is showing rx was refilled for 0.25mg .   Patient requesting a callback: [phone]

## 2023-05-15 ENCOUNTER — Encounter: Payer: Self-pay | Admitting: Gastroenterology

## 2023-05-22 ENCOUNTER — Other Ambulatory Visit: Payer: Self-pay

## 2023-05-22 ENCOUNTER — Ambulatory Visit: Admitting: Certified Registered Nurse Anesthetist

## 2023-05-22 ENCOUNTER — Ambulatory Visit
Admission: RE | Admit: 2023-05-22 | Discharge: 2023-05-22 | Disposition: A | Discharge status: Home / Self Care | Attending: Gastroenterology | Admitting: Gastroenterology

## 2023-05-22 ENCOUNTER — Encounter: Payer: Self-pay | Admitting: Gastroenterology

## 2023-05-22 ENCOUNTER — Encounter
Admission: RE | Disposition: A | Discharge status: Home / Self Care | Payer: Self-pay | Source: Home / Self Care | Attending: Gastroenterology

## 2023-05-22 DIAGNOSIS — K219 Gastro-esophageal reflux disease without esophagitis: Secondary | ICD-10-CM | POA: Diagnosis not present

## 2023-05-22 DIAGNOSIS — K5904 Chronic idiopathic constipation: Secondary | ICD-10-CM | POA: Insufficient documentation

## 2023-05-22 DIAGNOSIS — G40409 Other generalized epilepsy and epileptic syndromes, not intractable, without status epilepticus: Secondary | ICD-10-CM | POA: Diagnosis not present

## 2023-05-22 DIAGNOSIS — F32A Depression, unspecified: Secondary | ICD-10-CM | POA: Insufficient documentation

## 2023-05-22 DIAGNOSIS — F419 Anxiety disorder, unspecified: Secondary | ICD-10-CM | POA: Insufficient documentation

## 2023-05-22 DIAGNOSIS — Z6841 Body Mass Index (BMI) 40.0 and over, adult: Secondary | ICD-10-CM | POA: Insufficient documentation

## 2023-05-22 DIAGNOSIS — J45909 Unspecified asthma, uncomplicated: Secondary | ICD-10-CM | POA: Diagnosis not present

## 2023-05-22 DIAGNOSIS — F909 Attention-deficit hyperactivity disorder, unspecified type: Secondary | ICD-10-CM | POA: Diagnosis not present

## 2023-05-22 DIAGNOSIS — E6689 Other obesity not elsewhere classified: Secondary | ICD-10-CM | POA: Diagnosis not present

## 2023-05-22 DIAGNOSIS — K5909 Other constipation: Secondary | ICD-10-CM | POA: Diagnosis present

## 2023-05-22 DIAGNOSIS — K625 Hemorrhage of anus and rectum: Secondary | ICD-10-CM

## 2023-05-22 HISTORY — PX: COLONOSCOPY: SHX5424

## 2023-05-22 LAB — GLUCOSE, CAPILLARY: Glucose-Capillary: 93 mg/dL (ref 70–99)

## 2023-05-22 LAB — POCT PREGNANCY, URINE: Preg Test, Ur: NEGATIVE

## 2023-05-22 SURGERY — COLONOSCOPY
Anesthesia: General

## 2023-05-22 MED ORDER — DEXMEDETOMIDINE HCL IN NACL 80 MCG/20ML IV SOLN
INTRAVENOUS | Status: DC | PRN
  Administered 2023-05-22: 4 ug via INTRAVENOUS

## 2023-05-22 MED ORDER — MIDAZOLAM HCL 2 MG/2ML IJ SOLN
INTRAMUSCULAR | Status: AC
  Filled 2023-05-22: qty 2

## 2023-05-22 MED ORDER — SODIUM CHLORIDE 0.9 % IV SOLN
INTRAVENOUS | Status: DC
Start: 2023-05-22 — End: 2023-05-22

## 2023-05-22 MED ORDER — PROPOFOL 500 MG/50ML IV EMUL
INTRAVENOUS | Status: DC | PRN
  Administered 2023-05-22: 130 ug/kg/min via INTRAVENOUS

## 2023-05-22 MED ORDER — LIDOCAINE HCL (CARDIAC) PF 100 MG/5ML IV SOSY
PREFILLED_SYRINGE | INTRAVENOUS | Status: DC | PRN
  Administered 2023-05-22: 100 mg via INTRAVENOUS

## 2023-05-22 MED ORDER — PROPOFOL 10 MG/ML IV BOLUS
INTRAVENOUS | Status: DC | PRN
  Administered 2023-05-22: 50 mg via INTRAVENOUS

## 2023-05-22 MED ORDER — MIDAZOLAM HCL 2 MG/2ML IJ SOLN
INTRAMUSCULAR | Status: DC | PRN
  Administered 2023-05-22: 2 mg via INTRAVENOUS

## 2023-05-22 MED ORDER — STERILE WATER FOR IRRIGATION IR SOLN
Status: DC | PRN
  Administered 2023-05-22: 120 mL

## 2023-05-22 NOTE — Op Note (Signed)
 [facility] Gastroenterology Patient Name: Latoya Stark Procedure Date: 05/22/2023 10:10 AM MRN: [id number] Account #: [id number] Date of Birth: 09-18-1995 Admit Type: Outpatient Age: 28 Room: [facility] ENDO ROOM 2 Gender: Female Note Status: Finalized Instrument Name: Hyman Main [id number] Procedure:             Colonoscopy Indications:           Chronic idiopathic constipation Providers:             Luke Salaam MD, MD Referring MD:          Monalisa Angles. Abram Hoguet (Referring MD) Medicines:             Monitored Anesthesia Care Complications:         No immediate complications. Procedure:             Pre-Anesthesia Assessment:                        - Prior to the procedure, a History and Physical was                         performed, and patient medications, allergies and                         sensitivities were reviewed. The patient's tolerance                         of previous anesthesia was reviewed.                        - The risks and benefits of the procedure and the                         sedation options and risks were discussed with the                         patient. All questions were answered and informed                         consent was obtained.                        - ASA Grade Assessment: II - A patient with mild                         systemic disease.                        After obtaining informed consent, the colonoscope was                         passed under direct vision. Throughout the procedure,                         the patient's blood pressure, pulse, and oxygen                         saturations were monitored continuously. The                         Colonoscope was introduced through the  anus and                         advanced to the the cecum, identified by the                         appendiceal orifice. The colonoscopy was performed                         with ease. The patient tolerated the procedure well.                          The quality of the bowel preparation was excellent.                         The ileocecal valve, appendiceal orifice, and rectum                         were photographed. Findings:      The entire examined colon appeared normal on direct and retroflexion       views. Impression:            - The entire examined colon is normal on direct and                         retroflexion views.                        - No specimens collected. Recommendation:        - Discharge patient to home (with escort).                        - Resume previous diet.                        - Continue present medications.                        - Return to my office PRN. Procedure Code(s):     --- Professional ---                        [phone], Colonoscopy, flexible; diagnostic, including                         collection of specimen(s) by brushing or washing, when                         performed (separate procedure) Diagnosis Code(s):     --- Professional ---                        K59.04, Chronic idiopathic constipation CPT copyright 2022 American Medical Association. All rights reserved. The codes documented in this report are preliminary and upon coder review may  be revised to meet current compliance requirements. Luke Salaam, MD Luke Salaam MD, MD 05/22/2023 11:04:57 AM This report has been signed electronically. Number of Addenda: 0 Note Initiated On: 05/22/2023 10:10 AM Scope Withdrawal Time: 0 hours 6 minutes 48 seconds  Total Procedure Duration: 0 hours 11 minutes 10 seconds  Estimated Blood Loss:  Estimated blood loss: none.  [facility]

## 2023-05-22 NOTE — Anesthesia Preprocedure Evaluation (Signed)
 Anesthesia Evaluation  Patient identified by MRN, date of birth, ID band Patient awake    Reviewed: Allergy & Precautions, NPO status , Patient's Chart, lab work & pertinent test results  History of Anesthesia Complications Negative for: history of anesthetic complications  Airway Mallampati: IV  TM Distance: >3 FB Neck ROM: Full    Dental  (+) Missing, Chipped   Pulmonary neg pulmonary ROS, asthma    Pulmonary exam normal breath sounds clear to auscultation       Cardiovascular negative cardio ROS Normal cardiovascular exam Rhythm:Regular Rate:Normal  ECG 11/14/22: normal   Neuro/Psych  Headaches, Seizures -,  PSYCHIATRIC DISORDERS (ADHD, conversion disorder) Anxiety Depression    negative neurological ROS  negative psych ROS   GI/Hepatic negative GI ROS, Neg liver ROS,GERD  ,,  Endo/Other  negative endocrine ROSdiabetes  Class 4 obesityClass 3 obesity  Renal/GU negative Renal ROS  negative genitourinary   Musculoskeletal   Abdominal   Peds  Hematology negative hematology ROS (+) Blood dyscrasia, anemia   Anesthesia Other Findings Past Medical History: No date: ADHD (attention deficit hyperactivity disorder) No date: Anemia No date: Anxiety No date: Asthma No date: Back pain 08/01/2018: Chronic migraine No date: Constipation No date: Depression No date: Epilepsy (HCC) No date: Family history of adverse reaction to anesthesia     Comment:  maternal grandmother stopped breathing during surgery               2012 No date: GERD (gastroesophageal reflux disease) No date: Gestational diabetes 04/19/2021: Gestational diabetes mellitus (GDM) in third trimester     Comment:  Normal PP GTT 10/05/2013: Gonorrhea in female 08/01/2018: 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 [facility])     Comment:  started at age 81 No date: Headache No date: Obesity No date: Pneumonia 08/11/2021: Postpartum depression, postpartum condition No  date: Seizures (HCC)     Comment:  "when I get too hot" No date: Trichomonas infection 04/2022: Umbilical hernia No date: UTI (lower urinary tract infection) 08/11/2021: Vulvar abscess  Past Surgical History: 08/09/2013: DILATION AND EVACUATION; N/A     Comment:  Procedure: DILATATION AND EVACUATION;  Surgeon: Heide Livings, MD;  Location: [facility]S;  Service: Gynecology;               Laterality: N/A; 2017: DILATION AND EVACUATION 11/10/2022: ESOPHAGOGASTRODUODENOSCOPY (EGD) WITH PROPOFOL ; N/A     Comment:  Procedure: ESOPHAGOGASTRODUODENOSCOPY (EGD) WITH               PROPOFOL ;  Surgeon: Luke Salaam, MD;  Location: [facility]               ENDOSCOPY;  Service: Gastroenterology;  Laterality: N/A; No date: EUSTACHIAN TUBE DILATION 11/16/2022: EXCISION MASS NECK; N/A     Comment:  Procedure: EXCISION MASS NECK;  Surgeon: Alben Alma,              MD;  Location: [facility] ORS;  Service: General;  Laterality:               N/A;  JP drain placed 2007: LACERATION REPAIR; Left     Comment:  5th toe 2010: ORTHOPEDIC SURGERY; Left     Comment:  hand fracture; screws in place 2001: TONSILLECTOMY AND ADENOIDECTOMY 2002: TYMPANOSTOMY TUBE PLACEMENT; Bilateral 05/12/2022: UMBILICAL HERNIA REPAIR; N/A     Comment:  Procedure: LAPAROSCOPIC ASSISTED SUPRA UMBILICAL HERNIA  REPAIR;  Surgeon: Jacolyn Matar, MD;  Location: [facility] ORS;              Service: General;  Laterality: N/A;  BMI    Body Mass Index: 57.41 kg/m      Reproductive/Obstetrics negative OB ROS                             Anesthesia Physical Anesthesia Plan  ASA: 3  Anesthesia Plan: General   Post-op Pain Management: Minimal or no pain anticipated   Induction: Intravenous  PONV Risk Score and Plan: 3 and Propofol  infusion, TIVA and Ondansetron   Airway Management Planned: Nasal Cannula  Additional Equipment: None  Intra-op Plan:   Post-operative Plan:   Informed  Consent: I have reviewed the patients History and Physical, chart, labs and discussed the procedure including the risks, benefits and alternatives for the proposed anesthesia with the patient or authorized representative who has indicated his/her understanding and acceptance.     Dental advisory given  Plan Discussed with: CRNA and Surgeon  Anesthesia Plan Comments: (Discussed risks of anesthesia with patient, including possibility of difficulty with spontaneous ventilation under anesthesia necessitating airway intervention, PONV, and rare risks such as cardiac or respiratory or neurological events, and allergic reactions. Discussed the role of CRNA in patient's perioperative care. Patient understands.)        Anesthesia Quick Evaluation

## 2023-05-22 NOTE — Anesthesia Procedure Notes (Signed)
 Procedure Name: MAC Date/Time: 05/22/2023 10:42 AM  Performed by: Delice Felt, CRNAPre-anesthesia Checklist: Patient identified, Emergency Drugs available, Suction available and Patient being monitored Patient Re-evaluated:Patient Re-evaluated prior to induction Oxygen Delivery Method: Simple face mask Induction Type: IV induction Placement Confirmation: positive ETCO2

## 2023-05-22 NOTE — H&P (Signed)
 Luke Salaam, MD [address], [place], [place], [address] [address], [address], [place], [place], [address] Phone: [phone]  Fax: [phone]  Primary Care Physician:  Quinton Buckler, FNP   Pre-Procedure History & Physical: HPI:  Latoya Stark is a 28 y.o. female is here for an colonoscopy.   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   started at age 65   Headache    Obesity    Pneumonia    Postpartum depression, postpartum condition 08/11/2021   Seizures (HCC)    "when I get too hot"   Trichomonas infection    Umbilical hernia 04/2022   UTI (lower urinary tract infection)    Vulvar abscess 08/11/2021    Past Surgical History:  Procedure Laterality Date   DILATION AND EVACUATION N/A 08/09/2013   Procedure: DILATATION AND EVACUATION;  Surgeon: Heide Livings, MD;  Location: [facility];  Service: Gynecology;  Laterality: N/A;   DILATION AND EVACUATION  2017   ESOPHAGOGASTRODUODENOSCOPY (EGD) WITH PROPOFOL  N/A 11/10/2022   Procedure: ESOPHAGOGASTRODUODENOSCOPY (EGD) WITH PROPOFOL ;  Surgeon: Luke Salaam, MD;  [facility] ENDOSCOPY;  Service: Gastroenterology;  Laterality: N/A;   EUSTACHIAN TUBE DILATION     EXCISION MASS NECK N/A 11/16/2022   Procedure: EXCISION MASS NECK;  Surgeon: Alben Alma, MD;  Location: [facility] ORS;  Service: General;  Laterality: N/A;  JP drain placed   LACERATION REPAIR Left 2007   5th toe   ORTHOPEDIC SURGERY  Left 2010   hand fracture; screws in place   TONSILLECTOMY AND ADENOIDECTOMY  2001   TYMPANOSTOMY TUBE PLACEMENT Bilateral 2002   UMBILICAL HERNIA REPAIR N/A 05/12/2022   Procedure: LAPAROSCOPIC ASSISTED SUPRA UMBILICAL HERNIA REPAIR;  Surgeon: Jacolyn Matar, MD;  Location: [facility]S;  Service: General;  Laterality: N/A;    Prior to Admission medications   Medication Sig Start Date End Date Taking? Authorizing Provider  albuterol  (VENTOLIN  HFA) 108 (90 Base) MCG/ACT inhaler Inhale 1-2 puffs into the lungs every 6 (six) hours as needed for wheezing or shortness of breath. 11/24/22   Quinton Buckler, FNP  cetirizine  (ZYRTEC ) 10 MG tablet Take 1 tablet (10 mg total) by mouth daily. 05/02/23   Pender, [name] F, FNP  cyclobenzaprine  (FLEXERIL ) 10 MG tablet Take 0.5-1 tablets (5-10 mg total) by mouth 3 (three) times daily as needed. 02/15/23   Quinton Buckler, FNP  docusate sodium  (COLACE) 100 MG capsule Take 1 capsule (100 mg total) by mouth daily as needed for mild constipation. 12/04/22   Pender, [name] F, FNP  gabapentin  (NEURONTIN ) 100 MG capsule Take 2 capsules (200 mg total) by mouth at bedtime. 05/10/23   Lomax, Amy, NP  hydrOXYzine  (VISTARIL ) 25 MG capsule TAKE 1 CAPSULE (25 MG TOTAL) BY MOUTH AT BEDTIME AS NEEDED FOR ANXIETY. 02/21/23   Quinton Buckler, FNP  ibuprofen  (IBU) 800 MG tablet Take 1  tablet (800 mg total) by mouth every 8 (eight) hours as needed for moderate pain (pain score 4-6). 01/18/23   Pender, [name] F, FNP  lamoTRIgine  (LAMICTAL ) 200 MG tablet Take 1 tablet (200 mg total) by mouth 3 (three) times daily. 05/03/23   Lomax, Amy, NP  levETIRAcetam  (KEPPRA ) 750 MG tablet Take 2 tablets (1,500 mg total) by mouth 2 (two) times daily. 05/03/23   Lomax, Amy, NP  Levonorgestrel-Ethinyl Estrad (TYBLUME ) 0.1-20 MG-MCG CHEW Chew 1 tablet by mouth daily for 28 days. 12/13/22 01/10/23  Granville Layer, MD  ofloxacin  (FLOXIN ) 0.3 % OTIC solution Place 10 drops into the left ear daily for 7 days. Patient not  taking: Reported on 04/19/2023 03/13/23   Quinton Buckler, FNP  Plecanatide  (TRULANCE ) 3 MG TABS Take 1 tablet (3 mg total) by mouth daily. Patient not taking: Reported on 04/19/2023 11/02/22   Luke Salaam, MD  Prenatal Vit-Fe Fumarate-FA (MULTIVITAMIN-PRENATAL) 27-0.8 MG TABS tablet Take 1 tablet by mouth daily. Patient not taking: Reported on 04/19/2023 12/04/22   Quinton Buckler, FNP  promethazine  (PHENERGAN ) 25 MG tablet Take 25 mg by mouth every 6 (six) hours as needed for nausea or vomiting. 12/04/21   [provider]  Semaglutide -Weight Management (WEGOVY ) 0.25 MG/0.5ML SOAJ Inject 0.25 mg into the skin once a week. 05/14/23   Quinton Buckler, FNP  Semaglutide -Weight Management 1.7 MG/0.75ML SOAJ Inject 1.7 mg into the skin once a week for 28 days. Patient not taking: Reported on 04/19/2023 05/12/23 06/09/23  Quinton Buckler, FNP  Semaglutide -Weight Management 2.4 MG/0.75ML SOAJ Inject 2.4 mg into the skin once a week. 06/11/23   Quinton Buckler, FNP  sertraline  (ZOLOFT ) 25 MG tablet Take 1 tablet (25 mg total) by mouth daily. 03/13/23   Pender, [name] F, FNP  sertraline  (ZOLOFT ) 50 MG tablet Take 1 tablet (50 mg total) by mouth daily. 03/13/23   Pender, [name] F, FNP  SYMBICORT  80-4.5 MCG/ACT inhaler Inhale 2 puffs into the lungs 2 (two) times daily as needed (shortness of breath). 11/24/22   Quinton Buckler, FNP  traZODone  (DESYREL ) 50 MG tablet Take 0.5-1 tablets (25-50 mg total) by mouth at bedtime as needed for sleep. 02/15/23   Quinton Buckler, FNP  Vonoprazan Fumarate  (VOQUEZNA ) 10 MG TABS Take 1 tablet by mouth daily. 01/25/23   Luke Salaam, MD  Vonoprazan Fumarate  (VOQUEZNA ) 20 MG TABS Take 1 tablet by mouth daily. 01/25/23   Luke Salaam, MD    Allergies as of 04/11/2023 - Review Complete 03/13/2023  Allergen Reaction Noted   Hydrocodone  Hives 05/18/2021   Milk-related compounds Dermatitis 02/24/2014   Mushroom extract complex (obsolete) Anaphylaxis 07/20/2014   Penicillins Anaphylaxis  02/07/2011   Cheese Other (See Comments) 02/15/2014   Latex Itching and Swelling 04/28/2014   Sulfa antibiotics Swelling 11/30/2011    Family History  Problem Relation Age of Onset   Depression Mother    Heart disease Mother    Cancer Mother        Bone cancer   Seizures Mother    Stroke Mother    Sickle cell anemia Mother    Thyroid  disease Mother    Heart disease Father    Hypertension Father    Seizures Maternal Aunt    Diabetes Maternal Grandmother    Colon cancer Maternal Grandmother    Hypertension Maternal Grandmother    Clotting disorder Maternal Grandmother    Breast cancer Maternal Grandmother    Stroke Maternal Grandmother    Diabetes Paternal  Grandmother     Social History   Socioeconomic History   Marital status: Significant Other    Spouse name: Jermaine   Number of children: 2   Years of education: Not on file   Highest education level: Associate degree: academic program  Occupational History   Not on file  Tobacco Use   Smoking status: Never    Passive exposure: Never   Smokeless tobacco: Never  Vaping Use   Vaping status: Never Used  Substance and Sexual Activity   Alcohol use: Yes    Comment: occassional   Drug use: No   Sexual activity: Not Currently    Partners: Male  Other Topics Concern   Not on file  Social History Narrative   Caffeine : tea sometimes   Lives with fiance   Right handed    Social Drivers of Health   Financial Resource Strain: Low Risk  (11/24/2022)   Overall Financial Resource Strain (CARDIA)    Difficulty of Paying Living Expenses: Not hard at all  Food Insecurity: No Food Insecurity (11/24/2022)   Hunger Vital Sign    Worried About Running Out of Food in the Last Year: Never true    Ran Out of Food in the Last Year: Never true  Transportation Needs: No Transportation Needs (11/24/2022)   PRAPARE - Administrator, Civil Service (Medical): No    Lack of Transportation (Non-Medical): No  Physical  Activity: Insufficiently Active (11/24/2022)   Exercise Vital Sign    Days of Exercise per Week: 5 days    Minutes of Exercise per Session: 20 min  Stress: No Stress Concern Present (11/24/2022)   [employer] of Occupational Health - Occupational Stress Questionnaire    Feeling of Stress : Not at all  Social Connections: Unknown (11/24/2022)   Social Connection and Isolation Panel [NHANES]    Frequency of Communication with Friends and Family: Patient declined    Frequency of Social Gatherings with Friends and Family: Patient declined    Attends Religious Services: Patient declined    Database administrator or Organizations: No    Attends Engineer, structural: Not on file    Marital Status: Never married  Intimate Partner Violence: Not At Risk (08/14/2018)   Humiliation, Afraid, Rape, and Kick questionnaire    Fear of Current or Ex-Partner: No    Emotionally Abused: No    Physically Abused: No    Sexually Abused: No    Review of Systems: See HPI, otherwise negative ROS  Physical Exam: There were no vitals taken for this visit. General:   Alert,  pleasant and cooperative in NAD Head:  Normocephalic and atraumatic. Neck:  Supple; no masses or thyromegaly. Lungs:  Clear throughout to auscultation, normal respiratory effort.    Heart:  +S1, +S2, Regular rate and rhythm, No edema. Abdomen:  Soft, nontender and nondistended. Normal bowel sounds, without guarding, and without rebound.   Neurologic:  Alert and  oriented x4;  grossly normal neurologically.  Impression/Plan: Latoya Stark is here for an colonoscopy to be performed for constipation.    Risks, benefits, limitations, and alternatives regarding  colonoscopy have been reviewed with the patient.  Questions have been answered.  All parties agreeable.   Luke Salaam, MD  05/22/2023, 10:19 AM

## 2023-05-22 NOTE — Anesthesia Postprocedure Evaluation (Signed)
 Anesthesia Post Note  Patient: Latoya Stark  Procedure(s) Performed: COLONOSCOPY  Patient location during evaluation: Endoscopy Anesthesia Type: General Level of consciousness: awake and alert Pain management: pain level controlled Vital Signs Assessment: post-procedure vital signs reviewed and stable Respiratory status: spontaneous breathing, nonlabored ventilation, respiratory function stable and patient connected to nasal cannula oxygen Cardiovascular status: blood pressure returned to baseline and stable Postop Assessment: no apparent nausea or vomiting Anesthetic complications: no  No notable events documented.   Last Vitals:  Vitals:   05/22/23 1041 05/22/23 1108  BP: 103/64 123/62  Pulse: 94 (!) 105  Resp: 20 15  Temp: (!) 35.7 C (!) 36.1 C  SpO2: 97% 99%    Last Pain:  Vitals:   05/22/23 1118  TempSrc:   PainSc: 0-No pain                 Enrique Harvest

## 2023-05-22 NOTE — Transfer of Care (Signed)
 Immediate Anesthesia Transfer of Care Note  Patient: Latoya Stark  Procedure(s) Performed: COLONOSCOPY  Patient Location: Endoscopy Unit  Anesthesia Type:General  Level of Consciousness: awake, alert , and oriented  Airway & Oxygen Therapy: Patient Spontanous Breathing  Post-op Assessment: Report given to RN and Post -op Vital signs reviewed and stable  Post vital signs: Reviewed and stable  Last Vitals:  Vitals Value Taken Time  BP    Temp    Pulse    Resp    SpO2      Last Pain:  Vitals:   05/22/23 1041  TempSrc: Temporal  PainSc: 0-No pain         Complications: No notable events documented.

## 2023-05-28 ENCOUNTER — Other Ambulatory Visit: Payer: Self-pay | Admitting: Nurse Practitioner

## 2023-05-28 ENCOUNTER — Telehealth: Payer: Self-pay

## 2023-05-28 DIAGNOSIS — E66813 Obesity, class 3: Secondary | ICD-10-CM

## 2023-05-28 DIAGNOSIS — Z6841 Body Mass Index (BMI) 40.0 and over, adult: Secondary | ICD-10-CM

## 2023-05-28 NOTE — Telephone Encounter (Signed)
 Copied from CRM [id number]. Topic: Clinical - Medication Question >> May 28, 2023  3:26 PM Star East wrote: Reason for CRM: Needs something for nausea due to the [facility], please call cell [phone] and work [phone]

## 2023-05-28 NOTE — Telephone Encounter (Signed)
 Copied from CRM [id number]. Topic: Clinical - Medication Refill >> May 28, 2023  3:21 PM Bridgette Campus T wrote: Medication: Semaglutide -Weight Management 1.7 MG/0.75ML SOAJ  Has the patient contacted their pharmacy? Yes (Agent: If no, request that the patient contact the pharmacy for the refill. If patient does not wish to contact the pharmacy document the reason why and proceed with request.) (Agent: If yes, when and what did the pharmacy advise?)  This is the patient's preferred pharmacy:    [facility] [address] [place] [address] Phone: [phone] Fax: [phone]  Is this the correct pharmacy for this prescription? Yes If no, delete pharmacy and type the correct one.   Has the prescription been filled recently? Yes  Is the patient out of the medication? Yes  Has the patient been seen for an appointment in the last year OR does the patient have an upcoming appointment? Yes  Can we respond through MyChart? Yes  Agent: Please be advised that Rx refills may take up to 3 business days. We ask that you follow-up with your pharmacy.

## 2023-05-28 NOTE — Telephone Encounter (Signed)
 Message sent through my chart

## 2023-05-30 ENCOUNTER — Other Ambulatory Visit: Payer: Self-pay

## 2023-05-30 MED ORDER — WEGOVY 0.5 MG/0.5ML ~~LOC~~ SOAJ
0.5000 mg | SUBCUTANEOUS | 0 refills | Status: DC
  Filled 2023-06-15: qty 2, 28d supply, fill #0

## 2023-05-30 MED ORDER — MELOXICAM 7.5 MG PO TABS
7.5000 mg | ORAL_TABLET | Freq: Every day | ORAL | 0 refills | Status: DC
Start: 2023-03-09 — End: 2023-08-20
  Filled 2023-06-15: qty 30, 30d supply, fill #0

## 2023-05-30 MED ORDER — SEMAGLUTIDE-WEIGHT MANAGEMENT 1.7 MG/0.75ML ~~LOC~~ SOAJ
1.7000 mg | SUBCUTANEOUS | 0 refills | Status: DC
  Filled 2023-05-30 (×2): qty 3, 28d supply, fill #0

## 2023-05-30 MED ORDER — SEMAGLUTIDE-WEIGHT MANAGEMENT 1 MG/0.5ML ~~LOC~~ SOAJ
1.0000 mg | SUBCUTANEOUS | 0 refills | Status: DC
  Filled 2023-06-15: qty 2, fill #0

## 2023-05-30 NOTE — Telephone Encounter (Signed)
 A1C in date.  Requested Prescriptions  Pending Prescriptions Disp Refills   Semaglutide -Weight Management 1.7 MG/0.75ML SOAJ 3 mL 0    Sig: Inject 1.7 mg into the skin once a week for 28 days.     Endocrinology:  Diabetes - GLP-1 Receptor Agonists - semaglutide  Failed - 05/30/2023  1:07 PM      Failed - HBA1C in normal range and within 180 days    Hgb A1c MFr Bld  Date Value Ref Range Status  04/19/2023 5.8 (H) 4.8 - 5.6 % Final    Comment:             Prediabetes: 5.7 - 6.4          Diabetes: >6.4          Glycemic control for adults with diabetes: <7.0          Passed - Cr in normal range and within 360 days    Creatinine, Ser  Date Value Ref Range Status  04/19/2023 0.82 0.57 - 1.00 mg/dL Final   Creatinine, Urine  Date Value Ref Range Status  05/18/2021 52 mg/dL Final         Passed - Valid encounter within last 6 months    Recent Outpatient Visits           2 months ago Left ear pain   [place] [facility] Quinton Buckler, FNP   3 months ago Class 3 severe obesity due to excess calories with serious comorbidity and body mass index (BMI) of 50.0 to 59.9 in adult [facility])   [facility] Quinton Buckler, FNP   4 years ago Blood pressure check   [place] [facility] Marius Siemens, NP   4 years ago Encounter to establish care   [place] [facility] Marius Siemens, NP       Future Appointments             In 3 months Abram Hoguet, Monalisa Angles, FNP [facility], [facility]

## 2023-06-01 ENCOUNTER — Other Ambulatory Visit: Payer: Self-pay

## 2023-06-06 ENCOUNTER — Other Ambulatory Visit: Payer: Self-pay

## 2023-06-06 MED ORDER — SEMAGLUTIDE-WEIGHT MANAGEMENT 1.7 MG/0.75ML ~~LOC~~ SOAJ
1.7000 mg | SUBCUTANEOUS | 0 refills | Status: DC
Start: 2023-06-06 — End: 2023-07-06
  Filled 2023-06-06: qty 3, 28d supply, fill #0

## 2023-06-06 NOTE — Addendum Note (Signed)
 Addended by: Tsosie Gail on: 06/06/2023 04:23 PM   Modules accepted: Orders

## 2023-06-07 ENCOUNTER — Ambulatory Visit: Admitting: Obstetrics and Gynecology

## 2023-06-12 ENCOUNTER — Encounter: Payer: Self-pay | Admitting: Neurology

## 2023-06-12 ENCOUNTER — Institutional Professional Consult (permissible substitution): Admitting: Neurology

## 2023-06-13 ENCOUNTER — Telehealth: Payer: Self-pay | Admitting: Family Medicine

## 2023-06-13 NOTE — Telephone Encounter (Signed)
 Request to r/s appointment

## 2023-06-15 ENCOUNTER — Other Ambulatory Visit: Payer: Self-pay

## 2023-06-15 MED FILL — Semaglutide (Weight Mngmt) Soln Auto-Injector 0.25 MG/0.5ML: SUBCUTANEOUS | 28 days supply | Qty: 2 | Fill #1 | Status: CN

## 2023-06-18 ENCOUNTER — Other Ambulatory Visit: Payer: Self-pay | Admitting: Nurse Practitioner

## 2023-06-18 MED ORDER — EPINEPHRINE 0.3 MG/0.3ML IJ SOAJ: 0.3000 mg | Freq: Once | INTRAMUSCULAR | 1 refills | Status: DC | PRN

## 2023-06-20 ENCOUNTER — Other Ambulatory Visit: Payer: Self-pay

## 2023-07-03 ENCOUNTER — Other Ambulatory Visit: Payer: Self-pay

## 2023-07-03 MED ORDER — CLINDAMYCIN HCL 300 MG PO CAPS
300.0000 mg | ORAL_CAPSULE | Freq: Four times a day (QID) | ORAL | 0 refills | Status: DC
  Filled 2023-07-03: qty 28, 7d supply, fill #0

## 2023-07-05 ENCOUNTER — Ambulatory Visit (INDEPENDENT_AMBULATORY_CARE_PROVIDER_SITE_OTHER): Admitting: Family Medicine

## 2023-07-05 ENCOUNTER — Ambulatory Visit: Payer: Self-pay

## 2023-07-05 NOTE — Telephone Encounter (Signed)
 FYI Only or Action Required?: Action required by provider: request for appointment.  Patient was last seen in primary care on 04/19/2023 by Latoya Sober, DO. Called Nurse Triage reporting Anxiety. Symptoms began today. Interventions attempted: Nothing. Symptoms are: gradually worsening.  Triage Disposition: See PCP When Office is Open (Within 3 Days)  Patient/caregiver understands and will follow disposition?: yes will follow dispo  Copied from CRM [id number]. Topic: Clinical - Red Word Triage >> Jul 05, 2023  2:15 PM Edsel HERO wrote: High anxiety Reason for Disposition  MODERATE anxiety (e.g., persistent or frequent anxiety symptoms; interferes with sleep, school, or work)  Answer Assessment - Initial Assessment Questions 1. CONCERN: Did anything happen that prompted you to call today?      Anxiety has been increasing 2. ANXIETY SYMPTOMS: Can you describe how you (your loved one; patient) have been feeling? (e.g., tense, restless, panicky, anxious, keyed up, overwhelmed, sense of impending doom).      Pt states she feels anxious, nervous, chest feels tight 3. ONSET: How long have you been feeling this way? (e.g., hours, days, weeks)     For about an hour 4. SEVERITY: How would you rate the level of anxiety? (e.g., 0 - 10; or mild, moderate, severe).     8 5. FUNCTIONAL IMPAIRMENT: How have these feelings affected your ability to do daily activities? Have you had more difficulty than usual doing your normal daily activities? (e.g., getting better, same, worse; self-care, school, work, interactions)     Affecting daily living 6. HISTORY: Have you felt this way before? Have you ever been diagnosed with an anxiety problem in the past? (e.g., generalized anxiety disorder, panic attacks, PTSD). If Yes, ask: How was this problem treated? (e.g., medicines, counseling, etc.)     Hx of anxiety, pt states that she was on a med awhile back 7. RISK OF HARM - SUICIDAL IDEATION: Do  you ever have thoughts of hurting or killing yourself? If Yes, ask:  Do you have these feelings now? Do you have a plan on how you would do this?     denies 8. TREATMENT:  What has been done so far to treat this anxiety? (e.g., medicines, relaxation strategies). What has helped?     Breathe, walk around, watch something that normally keeps me calm, speak to coworker 9. TREATMENT - THERAPIST: Do you have a counselor or therapist? Name?     Mrs Selena missing appts 10. POTENTIAL TRIGGERS: Do you drink caffeinated beverages (e.g., coffee, colas, teas), and how much daily? Do you drink alcohol or use any drugs? Have you started any new medicines recently?       denies 11. PATIENT SUPPORT: Who is with you now? Who do you live with? Do you have family or friends who you can talk to?        Dad and step mom 12. OTHER SYMPTOMS: Do you have any other symptoms? (e.g., feeling depressed, trouble concentrating, trouble sleeping, trouble breathing, palpitations or fast heartbeat, chest pain, sweating, nausea, or diarrhea)       Feeling depressed, trouble with sleeping and concentrating 13. PREGNANCY: Is there any chance you are pregnant? When was your last menstrual period?       Denies  Pt states that she has resources and that she feels safe ending the call. Routing HP to clinic for scheduling.  Protocols used: Anxiety and Panic Attack-A-AH

## 2023-07-05 NOTE — Telephone Encounter (Signed)
 FYI Only or Action Required?: Action required by provider: clinical question for provider.  Patient was last seen in primary care on 04/19/2023 by Midge Sober, DO. Called Nurse Triage reporting Anxiety. Symptoms began several days ago. Interventions attempted: Prescription medications: hydroxyzine . Symptoms are: stable.  Triage Disposition: Call PCP When Office is Open, See PCP When Office is Open (Within 3 Days)  Patient/caregiver understands and will follow disposition?: Yes  FYI- Patient called back to update that her anxiety level is coming down. She is wanting to know if she can double her dose of hydroxyzine ? Pt requesting appt to discuss complimentary medications. Virtual visit scheduled for 0740 6/27 with PCP. Pt denies any thoughts of self harm at this time. Says she feels okay to maintain until appt tomorrow.       Reason for Disposition  Caller wants to use a complementary or alternative medicine  Answer Assessment - Initial Assessment Questions 1. NAME of MEDICINE: What medicine(s) are you calling about?     Vistaril   2. QUESTION: What is your question? (e.g., double dose of medicine, side effect)     Patient asking if she can take double of her anxiety medication  3. PRESCRIBER: Who prescribed the medicine? Reason: if prescribed by specialist, call should be referred to that group.     Mliss Spray, NP  4. SYMPTOMS: Do you have any symptoms? If Yes, ask: What symptoms are you having?  How bad are the symptoms (e.g., mild, moderate, severe)     Mild tto moderate  5. PREGNANCY:  Is there any chance that you are pregnant? When was your last menstrual period?     No  Protocols used: Medication Question Call-A-AH

## 2023-07-05 NOTE — Progress Notes (Signed)
 Name: Latoya Stark   MRN: [id number]    DOB: Sep 23, 1995   Date:07/06/2023       Progress Note  Subjective  Chief Complaint  Chief Complaint  Patient presents with   Anxiety    More anxiety during the day, would like mediation to help during the day. Pt states what she is currently on not helping    I connected with  Alethia LITTIE Sharps  on 07/06/23 at  7:40 AM EDT by a video enabled telemedicine application and verified that I am speaking with the correct person using two identifiers.  I discussed the limitations of evaluation and management by telemedicine and the availability of in person appointments. The patient expressed understanding and agreed to proceed with a virtual visit  Staff also discussed with the patient that there may be a patient responsible charge related to this service. Patient Location: car Provider Location: [facility] Additional Individuals present: alone  HPI   Discussed the use of AI scribe software for clinical note transcription with the patient, who gave verbal consent to proceed.  History of Present Illness Latoya Stark is a 28 year old female with anxiety and depression who presents with increased anxiety symptoms.  Over the past two days, she has experienced increased anxiety symptoms, particularly in settings with excessive excitement or when someone yells, leading to chest tightening and shaking. She has been using breathing techniques and listening to calming music, but these methods have not been effective recently.  She is currently taking Zoloft  75 mg daily and trazodone  25 to 50 mg at bedtime for insomnia. She has previously tried Buspar. Her anxiety screening score is elevated at 14, while her depression screening is negative.  She also reports experiencing nausea with her current dose of Wegovy  1.7 mg, which has been worsening over time. Her weight has plateaued, and she has tried different injection sites to manage side effects, finding her  arm to be less problematic than her stomach.       07/06/2023    7:35 AM 04/19/2023    8:59 AM 02/15/2023    7:39 AM 01/18/2023    7:31 AM 11/24/2022    1:04 PM  Depression screen PHQ 2/9  Decreased Interest 0 2 1 1 1   Down, Depressed, Hopeless 0 2 0 0 2  PHQ - 2 Score 0 4 1 1 3   Altered sleeping 0 1 3 3 3   Tired, decreased energy 0 2 1 1  0  Change in appetite 0 2 0 1 1  Feeling bad or failure about yourself  0 0 1 0 0  Trouble concentrating 0 0 0 0 1  Moving slowly or fidgety/restless 0 0 0 3 1  Suicidal thoughts 0 0 0 0 0  PHQ-9 Score 0 9 6 9 9   Difficult doing work/chores Not difficult at all Somewhat difficult Somewhat difficult  Somewhat difficult       07/06/2023    7:35 AM 02/15/2023    7:40 AM 01/18/2023    7:32 AM 11/24/2022    1:05 PM  GAD 7 : Generalized Anxiety Score  Nervous, Anxious, on Edge 2 3 3 1   Control/stop worrying 2 3 3 1   Worry too much - different things 2 1 3 3   Trouble relaxing 2 2 3 3   Restless 2 0 1 0  Easily annoyed or irritable 2 1 3 1   Afraid - awful might happen 2 0 2 1  Total GAD 7 Score 14 10  18 10  Anxiety Difficulty Very difficult  Somewhat difficult Somewhat difficult     Patient Active Problem List   Diagnosis Date Noted   Class 3 severe obesity due to excess calories with serious comorbidity and body mass index (BMI) of 50.0 to 59.9 in adult 02/15/2023   Mild persistent asthma without complication 11/24/2022   Anemia 11/24/2022   Mild episode of recurrent major depressive disorder (HCC) 11/24/2022   Anxiety 11/24/2022   Lipoma of neck 11/16/2022   Dysphagia 11/10/2022   Chronic migraine without aura without status migrainosus, not intractable 06/22/2020   BMI 50.0-59.9, adult (HCC) 02/20/2019   Conversion disorder 02/10/2019   History of seizure 07/30/2018   Other constipation 02/24/2014    Social History   Tobacco Use   Smoking status: Never    Passive exposure: Never   Smokeless tobacco: Never  Substance Use Topics    Alcohol use: Yes    Comment: occassional     Current Outpatient Medications:    albuterol  (VENTOLIN  HFA) 108 (90 Base) MCG/ACT inhaler, Inhale 1-2 puffs into the lungs every 6 (six) hours as needed for wheezing or shortness of breath., Disp: 18 g, Rfl: 11   busPIRone (BUSPAR) 5 MG tablet, Take 1 tablet (5 mg total) by mouth 3 (three) times daily., Disp: 90 tablet, Rfl: 0   cetirizine  (ZYRTEC ) 10 MG tablet, Take 1 tablet (10 mg total) by mouth daily., Disp: 30 tablet, Rfl: 0   clindamycin  (CLEOCIN ) 300 MG capsule, Take 1 capsule (300 mg total) by mouth every 6 (six) hours until gone., Disp: 28 capsule, Rfl: 0   cyclobenzaprine  (FLEXERIL ) 10 MG tablet, Take 0.5-1 tablets (5-10 mg total) by mouth 3 (three) times daily as needed., Disp: 30 tablet, Rfl: 0   docusate sodium  (COLACE) 100 MG capsule, Take 1 capsule (100 mg total) by mouth daily as needed for mild constipation., Disp: 20 capsule, Rfl: 1   EPINEPHrine  (EPIPEN  2-PAK) 0.3 mg/0.3 mL IJ SOAJ injection, Inject 0.3 mg into the muscle once as needed (for severe allergic reaction). CAll 911 immediately if you have to use this medicine, Disp: 2 each, Rfl: 1   gabapentin  (NEURONTIN ) 100 MG capsule, Take 2 capsules (200 mg total) by mouth at bedtime., Disp: 180 capsule, Rfl: 0   hydrOXYzine  (VISTARIL ) 25 MG capsule, TAKE 1 CAPSULE (25 MG TOTAL) BY MOUTH AT BEDTIME AS NEEDED FOR ANXIETY., Disp: 90 capsule, Rfl: 0   ibuprofen  (IBU) 800 MG tablet, Take 1 tablet (800 mg total) by mouth every 8 (eight) hours as needed for moderate pain (pain score 4-6)., Disp: 90 tablet, Rfl: 1   lamoTRIgine  (LAMICTAL ) 200 MG tablet, Take 1 tablet (200 mg total) by mouth 3 (three) times daily., Disp: 270 tablet, Rfl: 0   levETIRAcetam  (KEPPRA ) 750 MG tablet, Take 2 tablets (1,500 mg total) by mouth 2 (two) times daily., Disp: 360 tablet, Rfl: 0   meloxicam  (MOBIC ) 7.5 MG tablet, Take 1 tablet (7.5 mg total) by mouth daily., Disp: 30 tablet, Rfl: 0   sertraline  (ZOLOFT ) 100  MG tablet, Take 1 tablet (100 mg total) by mouth daily., Disp: 30 tablet, Rfl: 0   SYMBICORT  80-4.5 MCG/ACT inhaler, Inhale 2 puffs into the lungs 2 (two) times daily as needed (shortness of breath)., Disp: 1 each, Rfl: 10   tirzepatide  (ZEPBOUND ) 2.5 MG/0.5ML Pen, Inject 2.5 mg into the skin once a week., Disp: 2 mL, Rfl: 0   traZODone  (DESYREL ) 50 MG tablet, Take 0.5-1 tablets (25-50 mg total) by mouth at  bedtime as needed for sleep., Disp: 30 tablet, Rfl: 0  Allergies  Allergen Reactions   Hydrocodone  Hives   Milk-Related Compounds Dermatitis    Breaks face out *can have 2% milk*   Mushroom Extract Complex (Obsolete) Anaphylaxis   Penicillins Anaphylaxis    Has patient had a PCN reaction causing immediate rash, facial/tongue/throat swelling, SOB or lightheadedness with hypotension: Yes Has patient had a PCN reaction causing severe rash involving mucus membranes or skin necrosis: No Has patient had a PCN reaction that required hospitalization No Has patient had a PCN reaction occurring within the last 10 years: Yes If all of the above answers are NO, then may proceed with Cephalosporin use.    Cheese Other (See Comments)    Breaks face out   Latex Itching and Swelling   Sulfa Antibiotics Swelling    I personally reviewed active problem list, medication list, allergies, notes from last encounter with the patient/caregiver today.  ROS  Ten systems reviewed and is negative except as mentioned in HPI   Objective  Virtual encounter, vitals not obtained.  There is no height or weight on file to calculate BMI.  Nursing Note and Vital Signs reviewed.  Physical Exam  Awake, alert and oriented, speaking in complete sentences   No results found for this or any previous visit (from the past 72 hours).  Assessment & Plan  Assessment and Plan Assessment & Plan Generalized anxiety disorder with insomnia/depression Increased anxiety over the past two days, triggered by  excitement or loud noises, leading to chest tightness and shaking. Current treatment includes Zoloft  75 mg daily and trazodone  25-50 mg at bedtime. Anxiety screening elevated at 14. Depression screening negative. Previous trial of Buspar. Current Zoloft  dose is low, with potential to increase to 200 mg. Options discussed include increasing Zoloft  or adding Buspar as needed. - Increase Zoloft  to 100 mg daily. Use current supply to achieve 100 mg until new prescription is filled. - Prescribe Buspar for use up to three times a day as needed for anxiety. - Schedule a virtual follow-up in four weeks to assess response to medication changes.  Obesity with anti-obesity medication-induced nausea Current treatment with Wegovy  1.7 mg causing increased nausea over time, though effective in appetite suppression. Weight has plateaued. She has tried different injection sites with some relief in arm but not stomach. Considering switch to Zepbound  due to persistent nausea and weight plateau. Discussed potential for similar side effects with Zepbound  but agreed to trial switch. - Switch from Wegovy  to Zepbound , pending insurance approval. - Advise trying different injection sites, including leg, to potentially reduce side effects. - Encourage continuation of lifestyle modifications, including dietary management and regular exercise. -continue to increase physical activity, getting at least 150 min of physical activity a week.  Work on including Runner, broadcasting/film/video 2 days a week.  - continue eating at a calorie deficit 1600-1700 cal a day, eating a well balanced diet with whole foods, avoiding processed foods.        -Red flags and when to present for emergency care or RTC including fever >101.9F, chest pain, shortness of breath, new/worsening/un-resolving symptoms,  reviewed with patient at time of visit. Follow up and care instructions discussed and provided in AVS. - I discussed the assessment and treatment plan with  the patient. The patient was provided an opportunity to ask questions and all were answered. The patient agreed with the plan and demonstrated an understanding of the instructions.  I provided 15 minutes of non-face-to-face time  during this encounter.  Mliss JULIANNA Spray, FNP

## 2023-07-05 NOTE — Telephone Encounter (Signed)
 This encounter was created in error - please disregard.

## 2023-07-06 ENCOUNTER — Telehealth (INDEPENDENT_AMBULATORY_CARE_PROVIDER_SITE_OTHER): Admitting: Nurse Practitioner

## 2023-07-06 ENCOUNTER — Other Ambulatory Visit (HOSPITAL_COMMUNITY): Payer: Self-pay

## 2023-07-06 ENCOUNTER — Telehealth: Payer: Self-pay | Admitting: Pharmacy Technician

## 2023-07-06 ENCOUNTER — Other Ambulatory Visit: Payer: Self-pay

## 2023-07-06 ENCOUNTER — Encounter: Payer: Self-pay | Admitting: Nurse Practitioner

## 2023-07-06 DIAGNOSIS — Z6841 Body Mass Index (BMI) 40.0 and over, adult: Secondary | ICD-10-CM

## 2023-07-06 DIAGNOSIS — E66813 Obesity, class 3: Secondary | ICD-10-CM | POA: Diagnosis not present

## 2023-07-06 DIAGNOSIS — F419 Anxiety disorder, unspecified: Secondary | ICD-10-CM | POA: Diagnosis not present

## 2023-07-06 DIAGNOSIS — F330 Major depressive disorder, recurrent, mild: Secondary | ICD-10-CM

## 2023-07-06 MED ORDER — BUSPIRONE HCL 5 MG PO TABS
5.0000 mg | ORAL_TABLET | Freq: Three times a day (TID) | ORAL | 0 refills | Status: DC
  Filled 2023-07-06: qty 90, 30d supply, fill #0

## 2023-07-06 MED ORDER — SERTRALINE HCL 100 MG PO TABS
100.0000 mg | ORAL_TABLET | Freq: Every day | ORAL | 0 refills | Status: DC
  Filled 2023-07-06: qty 30, 30d supply, fill #0

## 2023-07-06 MED ORDER — ZEPBOUND 2.5 MG/0.5ML ~~LOC~~ SOAJ
2.5000 mg | SUBCUTANEOUS | 0 refills | Status: DC
  Filled 2023-07-06: qty 2, 28d supply, fill #0

## 2023-07-06 NOTE — Telephone Encounter (Signed)
 Is there anything available for her sooner than what she is schedule for?

## 2023-07-06 NOTE — Telephone Encounter (Signed)
 Appt sch'd for 7/8 with [name]

## 2023-07-06 NOTE — Telephone Encounter (Signed)
 Prior Authorization form/request asks a question that requires your assistance. Please see the question below and advise accordingly. The PA will not be submitted until the necessary information is received.

## 2023-07-17 ENCOUNTER — Encounter: Payer: Self-pay | Admitting: Nurse Practitioner

## 2023-07-17 ENCOUNTER — Other Ambulatory Visit: Payer: Self-pay

## 2023-07-17 ENCOUNTER — Ambulatory Visit: Admitting: Nurse Practitioner

## 2023-07-17 VITALS — BP 128/78 | HR 104 | Temp 98.1°F | Resp 18 | Ht 64.0 in | Wt 330.1 lb

## 2023-07-17 DIAGNOSIS — F330 Major depressive disorder, recurrent, mild: Secondary | ICD-10-CM | POA: Diagnosis not present

## 2023-07-17 DIAGNOSIS — F419 Anxiety disorder, unspecified: Secondary | ICD-10-CM

## 2023-07-17 DIAGNOSIS — Z6841 Body Mass Index (BMI) 40.0 and over, adult: Secondary | ICD-10-CM | POA: Diagnosis not present

## 2023-07-17 DIAGNOSIS — E66813 Obesity, class 3: Secondary | ICD-10-CM | POA: Diagnosis not present

## 2023-07-17 MED ORDER — ZEPBOUND 2.5 MG/0.5ML ~~LOC~~ SOAJ
2.5000 mg | SUBCUTANEOUS | 0 refills | Status: DC
  Filled 2023-07-17 – 2023-07-18 (×2): qty 2, 28d supply, fill #0

## 2023-07-17 NOTE — Progress Notes (Signed)
 BP 128/78   Pulse (!) 104   Temp 98.1 F (36.7 C)   Resp 18   Ht 5' 4 (1.626 m)   Wt (!) 330 lb 1.6 oz (149.7 kg)   SpO2 94%   BMI 56.66 kg/m    Subjective:    Patient ID: Latoya Stark, female    DOB: 08/12/1995, 29 y.o.   MRN: [id number]  HPI: Latoya Stark is a 28 y.o. female  Chief Complaint  Patient presents with   Medical Management of Chronic Issues   Anxiety   Obesity    Discussed the use of AI scribe software for clinical note transcription with the patient, who gave verbal consent to proceed.  History of Present Illness Latoya Stark is a 28 year old female with obesity, depression, and anxiety who presents for weight management and anxiety.  She currently weighs 330 pounds and was previously on Wegovy  for weight management but experienced worsening nausea. She is interested in switching to Zepbound , but her insurance company requires more information for approval, including an in-person weigh-in within 45 days.  She has a history of depression and anxiety and is currently taking Buspar  5 mg three times daily, hydroxyzine  25 mg at bedtime, and Zoloft  100 mg daily. Zoloft  has been beneficial, but she feels it is not fully effective. She experiences moments of sadness, staring off into space, and crying, with anxiety being more problematic. Symptoms include irritability and difficulty sleeping, with frequent tossing and turning at night. She has a history of taking Foclex and Ritalin, which she discontinued due to feeling 'zoned out' and like a 'zombie.' Trazodone  was ineffective for sleep, so she no longer takes it. She continues to take hydroxyzine  and has recently picked up her Zoloft  refills.    Starting weight: 330 lbs Starting BMI: 56.66  Waist Measurement : 56 inches   - Encourage continuation of lifestyle modifications, including dietary management and regular exercise. -continue to increase physical activity, getting at least 150 min of  physical activity a week.  Work on including Runner, broadcasting/film/video 2 days a week.  - continue eating at a calorie deficit 1600-1700 cal a day, eating a well balanced diet with whole foods, avoiding processed foods.        07/17/2023    8:55 AM 07/06/2023    7:35 AM 04/19/2023    8:59 AM  Depression screen PHQ 2/9  Decreased Interest 0 0 2  Down, Depressed, Hopeless 3 0 2  PHQ - 2 Score 3 0 4  Altered sleeping 3 0 1  Tired, decreased energy 2 0 2  Change in appetite 1 0 2  Feeling bad or failure about yourself  1 0 0  Trouble concentrating 0 0 0  Moving slowly or fidgety/restless 2 0 0  Suicidal thoughts 0 0 0  PHQ-9 Score 12 0 9  Difficult doing work/chores  Not difficult at all Somewhat difficult       07/17/2023    8:59 AM 07/06/2023    7:35 AM 02/15/2023    7:40 AM 01/18/2023    7:32 AM  GAD 7 : Generalized Anxiety Score  Nervous, Anxious, on Edge 1 2 3 3   Control/stop worrying 3 2 3 3   Worry too much - different things 2 2 1 3   Trouble relaxing 3 2 2 3   Restless 3 2 0 1  Easily annoyed or irritable 0 2 1 3   Afraid - awful might happen 0 2 0 2  Total GAD 7 Score 12 14 10 18   Anxiety Difficulty Not difficult at all Very difficult  Somewhat difficult     Relevant past medical, surgical, family and social history reviewed and updated as indicated. Interim medical history since our last visit reviewed. Allergies and medications reviewed and updated.  Review of Systems  Constitutional: Negative for fever or weight change.  Respiratory: Negative for cough and shortness of breath.   Cardiovascular: Negative for chest pain or palpitations.  Gastrointestinal: Negative for abdominal pain, no bowel changes.  Musculoskeletal: Negative for gait problem or joint swelling.  Skin: Negative for rash.  Neurological: Negative for dizziness or headache.  No other specific complaints in a complete review of systems (except as listed in HPI above).      Objective:     BP 128/78   Pulse (!)  104   Temp 98.1 F (36.7 C)   Resp 18   Ht 5' 4 (1.626 m)   Wt (!) 330 lb 1.6 oz (149.7 kg)   SpO2 94%   BMI 56.66 kg/m    Wt Readings from Last 3 Encounters:  07/17/23 (!) 330 lb 1.6 oz (149.7 kg)  05/22/23 (!) 345 lb (156.5 kg)  04/19/23 (!) 338 lb (153.3 kg)    Physical Exam Physical Exam MEASUREMENTS: Weight- 330, BMI- 56.66. GENERAL: Alert, cooperative, well developed, no acute distress HEENT: Normocephalic, normal oropharynx, moist mucous membranes CHEST: Clear to auscultation bilaterally, No wheezes, rhonchi, or crackles CARDIOVASCULAR: Normal heart rate and rhythm, S1 and S2 normal without murmurs ABDOMEN: Soft, non-tender, non-distended, without organomegaly, Normal bowel sounds EXTREMITIES: No cyanosis or edema NEUROLOGICAL: Cranial nerves grossly intact, Moves all extremities without gross motor or sensory deficit   Results for orders placed or performed during the hospital encounter of 05/22/23  Pregnancy, urine POC   Collection Time: 05/22/23 10:26 AM  Result Value Ref Range   Preg Test, Ur NEGATIVE NEGATIVE  Glucose, capillary   Collection Time: 05/22/23 10:38 AM  Result Value Ref Range   Glucose-Capillary 93 70 - 99 mg/dL          Assessment & Plan:   Problem List Items Addressed This Visit       Other   Mild episode of recurrent major depressive disorder (HCC)   Anxiety   Class 3 severe obesity due to excess calories with serious comorbidity and body mass index (BMI) of 50.0 to 59.9 in adult - Primary   Relevant Medications   tirzepatide  (ZEPBOUND ) 2.5 MG/0.5ML Pen     Assessment and Plan Assessment & Plan Obesity Obesity with a current weight of 330 pounds. Previous use of Wegovy  resulted in significant nausea, prompting a switch to Zepbound . Insurance requires in-person weigh-in for Zepbound  approval. - Submit required information to insurance for Zepbound  approval - Recommend lifestyle modifications including eating in a calorie deficit  and engaging in physical activity with strength training  Anxiety disorder and depression Anxiety and depression with current treatment including Buspar , hydroxyzine , and Zoloft . Depression and anxiety screenings are elevated. Zoloft  has provided some improvement, but symptoms persist. Discussed gene site testing to optimize medication regimen. - Order gene site testing to guide medication selection - Continue current medications: Buspar  5 mg three times daily, hydroxyzine  25 mg at bedtime, Zoloft  100 mg daily - Arrange virtual follow-up to discuss gene site testing results        Follow up plan: Return in about 3 months (around 10/17/2023) for follow up, wieght check.

## 2023-07-18 ENCOUNTER — Telehealth: Payer: Self-pay | Admitting: Pharmacy Technician

## 2023-07-18 ENCOUNTER — Other Ambulatory Visit: Payer: Self-pay

## 2023-07-18 ENCOUNTER — Other Ambulatory Visit (HOSPITAL_COMMUNITY): Payer: Self-pay

## 2023-07-18 NOTE — Telephone Encounter (Signed)
 Pharmacy Patient Advocate Encounter  Received notification from [facility] [employer] that Prior Authorization for Zepbound  2.5MG /0.5ML pen-injectors has been APPROVED from 07/18/23 to 01/14/24. Ran test claim, Copay is $4.00. This test claim was processed through [facility]- copay amounts may vary at other pharmacies due to pharmacy/plan contracts, or as the patient moves through the different stages of their insurance plan.   PA #/Case ID/Reference #: [id number]

## 2023-07-18 NOTE — Telephone Encounter (Signed)
 Pharmacy Patient Advocate Encounter   Received notification from [employer]base that prior authorization for Zepbound  2.5MG /0.5ML pen-injectors is required/requested.   Insurance verification completed.   The patient is insured through [facility] .   Per test claim: PA required; PA submitted to above mentioned insurance via CoverMyMeds Key/confirmation #/EOC [id number] Status is pending

## 2023-08-02 ENCOUNTER — Other Ambulatory Visit: Payer: Self-pay

## 2023-08-02 ENCOUNTER — Ambulatory Visit (INDEPENDENT_AMBULATORY_CARE_PROVIDER_SITE_OTHER)

## 2023-08-02 ENCOUNTER — Ambulatory Visit (INDEPENDENT_AMBULATORY_CARE_PROVIDER_SITE_OTHER): Admitting: Podiatry

## 2023-08-02 DIAGNOSIS — Q6652 Congenital pes planus, left foot: Secondary | ICD-10-CM

## 2023-08-02 DIAGNOSIS — M216X2 Other acquired deformities of left foot: Secondary | ICD-10-CM

## 2023-08-02 DIAGNOSIS — M7752 Other enthesopathy of left foot: Secondary | ICD-10-CM | POA: Diagnosis not present

## 2023-08-02 DIAGNOSIS — Q6651 Congenital pes planus, right foot: Secondary | ICD-10-CM

## 2023-08-02 DIAGNOSIS — M25572 Pain in left ankle and joints of left foot: Secondary | ICD-10-CM

## 2023-08-02 MED ORDER — METHYLPREDNISOLONE 4 MG PO TBPK
ORAL_TABLET | ORAL | 0 refills | Status: DC
  Filled 2023-08-02: qty 21, 6d supply, fill #0

## 2023-08-02 MED ORDER — METHYLPREDNISOLONE 4 MG PO TBPK
ORAL_TABLET | ORAL | 0 refills | Status: DC
Start: 2023-08-02 — End: 2023-10-02

## 2023-08-02 NOTE — Patient Instructions (Addendum)
 For inserts I like POWERSTEPS, SUPERFEET, AETREX   --  When You Have a Walking Boot: Self-Care for Adults  A walking boot holds your foot or ankle in place after an injury or procedure. It helps with healing and stops your foot or ankle from getting hurt more. The boot has a hard, rigid outer frame. Its inner lining is a layer of padded material. The boots also have straps that you can adjust to secure them over your foot and leg. You may be given a walking boot if you can stand or walk on your injured foot. Ask how much you can walk while wearing the boot. How to put on your walking boot There are a few types of walking boots. Follow the steps for how to put on your certain type of boot. You may need to: Ask someone to help you put on the boot. Sit to put on the boot. Doing this is more comfortable. It also helps to prevent falls. Open up the boot fully. Put your foot in the boot so your heel rests against the back. Make sure your toes are supported by the base of the boot. They shouldn't hang over the front edge. Adjust the straps. The boot should feel secure but not too tight. Keep the boot straight. Do not bend the hard frame of the boot to get a good fit. How to walk with your walking boot How much you can walk will depend on your injury. Do not try to walk without wearing the boot until your health care provider says it's OK. While wearing the boot, you may need to: Use crutches or a cane as told. Wear a shoe with a heel on your other foot. This can help raise it to the height of the walking boot. Be careful when walking on surfaces that are uneven or wet. How to reduce swelling while using your walking boot  Rest your injured foot or leg as much as you can. Use ice or an ice pack as told. Take off your walking boot. Place a towel between your skin and the ice or between your cast and the ice. Leave the ice on for 20 minutes, 2-3 times a day. If your skin turns red, take off the  ice right away to prevent skin damage. The risk of damage is higher if you can't feel pain, heat, or cold. Move your toes often to reduce stiffness and swelling. Raise your foot or leg above the level of your heart while you're sitting down. Try to do this at least 2?3 hours each day. Use pillows as needed. If the swelling gets worse, loosen the boot. Rest your foot and leg. How to care for your skin and foot while using your walking boot Wear a long sock to stop your foot and leg from rubbing inside the boot. Take off the boot once a day to check the injured area. Check for sores, rashes, swelling, or wounds. The skin should be a healthy color. It shouldn't be pale or blue. Try to watch your walking pattern (gait) in the boot. Make sure it's fairly normal and that you don't walk with a clear limp. Take care of any wound or cut from surgery as told. Clean and wash the injured area as told. Gently dry your foot and leg before putting the boot back on. How to take off your walking boot Take off the walking boot as told by your provider. In most cases, it's OK to take off  the boot: When you're resting or sleeping. To clean your foot and leg. How to keep your walking boot clean Do not put any part of the boot in a washing machine or dryer. Do not use chemical cleaning products. These may irritate your skin. Do not soak the liner of the boot. Use a washcloth with mild soap and water to clean the frame and liner of the boot by hand. Let the boot dry fully before you put it back on your foot. Follow these instructions at home: Activity Ask what things are safe for you to do. Bathe and shower as told by your provider. Do not do things that could make your injury worse. Ask when it's safe to drive. Contact a health care provider if: The boot is cracked or damaged. The boot doesn't fit right. Your foot or leg hurts. You have a rash, sore, or open sore on your foot or leg. The skin on your foot  or leg is pale. You have a wound or cut on your foot that's getting worse. Your skin is painful, red, or irritated. Your swelling doesn't get better, or it gets worse. Get help right away if: Your foot or leg turns numb. The skin on your foot or leg is: Cold. Blue. Wallace Cullens. This information is not intended to replace advice given to you by your health care provider. Make sure you discuss any questions you have with your health care provider. Document Revised: 06/30/2022 Document Reviewed: 06/30/2022 Elsevier Patient Education  2024 [employer].

## 2023-08-03 ENCOUNTER — Ambulatory Visit: Admitting: Nurse Practitioner

## 2023-08-05 NOTE — Progress Notes (Signed)
 Subjective:  Patient ID: Latoya Stark, female    DOB: 08-08-1995,  MRN: [id number]  Chief Complaint  Patient presents with   Ankle Pain    Rm 13 Pt complains of pain left ankle lateral pain started 3 weeks ago/hurts with pressure and walking/no treatment for relief.    Discussed the use of AI scribe software for clinical note transcription with the patient, who gave verbal consent to proceed.  History of Present Illness Latoya Stark is a 28 year old female with a history of left ankle fracture who presents with worsening left ankle pain.  She experiences worsening pain in her left ankle over the past three weeks, primarily along the side and sometimes across the ankle, with associated swelling. She uses a brace but has not received recent treatment for the ankle. She injured the ankle approximately twelve years ago, resulting in a fracture that required re-breaking and casting, but no surgery. She manages the pain with ibuprofen  and Tylenol . No radiating or burning pain is present, but there is soreness when walking.      Objective:    Physical Exam General: AAO x3, NAD  Dermatological: Skin is warm, dry and supple bilateral.  There are no open sores, no preulcerative lesions, no rash or signs of infection present.  Vascular: Dorsalis Pedis artery and Posterior Tibial artery pedal pulses are 2/4 bilateral with immedate capillary fill time.  There is no pain with calf compression, swelling, warmth, erythema.   Neruologic: Grossly intact via light touch bilateral.   Musculoskeletal: There is tenderness to palpation on the left ankle joint itself.  There is also tenderness along the flexor, peroneal tendons however no pain in the Achilles tendon. MMT 5/5.  Gait: Unassisted, Nonantalgic.         Results RADIOLOGY Ankle X-ray: 3 views left foot, ankle were obtained and reviewed with the patient.  Flatfoot is evident.  Old fracture noted along the distal fibula.   There does appear to be some widening on the Left fifth likely from prior injury.  Otherwise ankle joint spaces maintained.  No evidence of acute fracture.   Assessment:   1. Capsulitis of left ankle   2. Congenital pes planus of right foot   3. Congenital pes planus of left foot      Plan:  Patient was evaluated and treated and all questions answered.  Assessment and Plan Assessment & Plan Left ankle pain with possible arthritis Chronic left ankle pain likely due to previous fracture and possible ligament injury. X-ray suggests arthritis and misalignment typical of flat foot, contributing to joint strain. Differential includes nerve-related pain from lumbar issues, but more likely related to joint and foot structure. - Prescribed Medrol  Dosepak. - Advised icing, rest, and elevation of the ankle. - Recommended wearing a walking boot until symptoms resolve. - Hold off on anti-inflammatories while on steroids.  Flat foot (pes planus) Flat foot contributing to ankle joint strain, with X-ray showing misalignment exacerbating ankle pain. - Provided recommendations for shoe inserts to support arch and improve alignment. - Advised transitioning to a shoe with supportive insert after one week in the walking boot.  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 Previous treatments ineffective.  - She is already under the care of another provider for this.   Return in about 6 weeks (around 09/13/2023), or if symptoms worsen or fail to improve.   Donnice JONELLE Fees DPM

## 2023-08-06 ENCOUNTER — Other Ambulatory Visit: Payer: Self-pay | Admitting: Nurse Practitioner

## 2023-08-06 ENCOUNTER — Other Ambulatory Visit: Payer: Self-pay

## 2023-08-06 DIAGNOSIS — G8929 Other chronic pain: Secondary | ICD-10-CM

## 2023-08-06 DIAGNOSIS — F330 Major depressive disorder, recurrent, mild: Secondary | ICD-10-CM

## 2023-08-06 DIAGNOSIS — J4530 Mild persistent asthma, uncomplicated: Secondary | ICD-10-CM

## 2023-08-06 DIAGNOSIS — F419 Anxiety disorder, unspecified: Secondary | ICD-10-CM

## 2023-08-07 ENCOUNTER — Other Ambulatory Visit: Payer: Self-pay | Admitting: Nurse Practitioner

## 2023-08-07 ENCOUNTER — Other Ambulatory Visit: Payer: Self-pay | Admitting: *Deleted

## 2023-08-07 ENCOUNTER — Other Ambulatory Visit: Payer: Self-pay

## 2023-08-07 DIAGNOSIS — F419 Anxiety disorder, unspecified: Secondary | ICD-10-CM

## 2023-08-07 DIAGNOSIS — F330 Major depressive disorder, recurrent, mild: Secondary | ICD-10-CM

## 2023-08-07 DIAGNOSIS — J4530 Mild persistent asthma, uncomplicated: Secondary | ICD-10-CM

## 2023-08-07 DIAGNOSIS — G8929 Other chronic pain: Secondary | ICD-10-CM

## 2023-08-07 MED ORDER — GABAPENTIN 100 MG PO CAPS
200.0000 mg | ORAL_CAPSULE | Freq: Every day | ORAL | 0 refills | Status: AC
  Filled 2023-08-07 – 2024-02-06 (×5): qty 180, 90d supply, fill #0

## 2023-08-07 NOTE — Telephone Encounter (Signed)
 Last seen on 10/09/22 Follow up scheduled on 10/11/23

## 2023-08-08 ENCOUNTER — Other Ambulatory Visit: Payer: Self-pay

## 2023-08-08 ENCOUNTER — Ambulatory Visit (HOSPITAL_COMMUNITY)
Admission: RE | Admit: 2023-08-08 | Discharge: 2023-08-08 | Disposition: A | Discharge status: Home / Self Care | Source: Ambulatory Visit | Attending: Emergency Medicine | Admitting: Emergency Medicine

## 2023-08-08 ENCOUNTER — Encounter (HOSPITAL_COMMUNITY): Payer: Self-pay

## 2023-08-08 VITALS — BP 127/89 | HR 88 | Temp 98.1°F | Resp 17

## 2023-08-08 DIAGNOSIS — G8929 Other chronic pain: Secondary | ICD-10-CM

## 2023-08-08 DIAGNOSIS — M545 Low back pain, unspecified: Secondary | ICD-10-CM

## 2023-08-08 MED ORDER — DEXAMETHASONE SODIUM PHOSPHATE 10 MG/ML IJ SOLN
10.0000 mg | Freq: Once | INTRAMUSCULAR | Status: AC
  Administered 2023-08-08: 10 mg via INTRAMUSCULAR

## 2023-08-08 MED ORDER — DEXAMETHASONE SODIUM PHOSPHATE 10 MG/ML IJ SOLN
INTRAMUSCULAR | Status: AC
  Filled 2023-08-08: qty 1

## 2023-08-08 MED FILL — Sertraline HCl Tab 100 MG: ORAL | 30 days supply | Qty: 30 | Fill #0 | Status: CN

## 2023-08-08 MED FILL — Cetirizine HCl Tab 10 MG: ORAL | 30 days supply | Qty: 30 | Fill #0 | Status: CN

## 2023-08-08 MED FILL — Cyclobenzaprine HCl Tab 10 MG: ORAL | 10 days supply | Qty: 30 | Fill #0 | Status: CN

## 2023-08-08 NOTE — Discharge Instructions (Addendum)
 We will try a steroid injection today in clinic to help with pain and inflammation. Starting tomorrow you can resume NSAIDs (Mobic  7.5 mg daily).   Please follow-up with orthopedics and consider pain management for your chronic back pain.

## 2023-08-08 NOTE — Telephone Encounter (Signed)
 Requested medication (s) are due for refill today - unsure  Requested medication (s) are on the active medication list -yes  Future visit scheduled -yes  Last refill: 02/15/23 #30  Notes to clinic: non delegated Rx  Requested Prescriptions  Pending Prescriptions Disp Refills   cyclobenzaprine  (FLEXERIL ) 10 MG tablet 30 tablet 0    Sig: Take 0.5-1 tablets (5-10 mg total) by mouth 3 (three) times daily as needed.     Not Delegated - Analgesics:  Muscle Relaxants Failed - 08/08/2023  1:21 PM      Failed - This refill cannot be delegated      Passed - Valid encounter within last 6 months    Recent Outpatient Visits           3 weeks ago Class 3 severe obesity due to excess calories with serious comorbidity and body mass index (BMI) of 50.0 to 59.9 in adult   [facility] Gareth Mliss FALCON, FNP   1 month ago Anxiety   [facility] Gareth Mliss F, FNP   4 months ago Left ear pain   [facility] Gareth Mliss F, FNP   5 months ago Class 3 severe obesity due to excess calories with serious comorbidity and body mass index (BMI) of 50.0 to 59.9 in adult [facility]   [facility] Gareth Mliss FALCON, FNP   4 years ago Blood pressure check   [place] [facility] Celestia Rosaline SQUIBB, NP       Future Appointments             Today  [facility] [place]   In 1 month Gareth Mliss FALCON, FNP [facility]            Signed Prescriptions Disp Refills   cetirizine  (ZYRTEC ) 10 MG tablet 30 tablet 0    Sig: Take 1 tablet (10 mg total) by mouth daily.     Ear, Nose, and Throat:  Antihistamines 2 Passed - 08/08/2023  1:21 PM      Passed - Cr in normal range and within 360 days    Creatinine, Ser  Date Value Ref Range Status  04/19/2023 0.82 0.57 - 1.00 mg/dL Final   Creatinine, Urine  Date Value Ref Range Status  05/18/2021 52  mg/dL Final         Passed - Valid encounter within last 12 months    Recent Outpatient Visits           3 weeks ago Class 3 severe obesity due to excess calories with serious comorbidity and body mass index (BMI) of 50.0 to 59.9 in adult   [facility] Gareth Mliss FALCON, FNP   1 month ago Anxiety   [facility] Gareth Mliss F, FNP   4 months ago Left ear pain   [place] [facility] Gareth Mliss F, FNP   5 months ago Class 3 severe obesity due to excess calories with serious comorbidity and body mass index (BMI) of 50.0 to 59.9 in adult [facility])   [place] [facility] Gareth Mliss FALCON, FNP   4 years ago Blood pressure check   [place] [facility] Celestia Rosaline SQUIBB, NP       Future Appointments             Today  [facility]  In 1 month Gareth, Mliss FALCON, FNP [facility]             sertraline  (ZOLOFT ) 100 MG tablet 30 tablet 0    Sig: Take 1 tablet (100 mg total) by mouth daily.     Psychiatry:  Antidepressants - SSRI - sertraline  Passed - 08/08/2023  1:21 PM      Passed - AST in normal range and within 360 days    AST  Date Value Ref Range Status  04/19/2023 19 0 - 40 IU/L Final         Passed - ALT in normal range and within 360 days    ALT  Date Value Ref Range Status  04/19/2023 23 0 - 32 IU/L Final         Passed - Completed PHQ-2 or PHQ-9 in the last 360 days      Passed - Valid encounter within last 6 months    Recent Outpatient Visits           3 weeks ago Class 3 severe obesity due to excess calories with serious comorbidity and body mass index (BMI) of 50.0 to 59.9 in adult   [facility] Gareth Mliss FALCON, FNP   1 month ago Anxiety   [facility] Gareth Mliss F, FNP   4 months ago Left ear pain   [place] [facility]  Gareth Mliss F, FNP   5 months ago Class 3 severe obesity due to excess calories with serious comorbidity and body mass index (BMI) of 50.0 to 59.9 in adult [facility])   [facility] Gareth Mliss FALCON, FNP   4 years ago Blood pressure check   [place] [facility] Celestia Rosaline SQUIBB, NP       Future Appointments             Today  [facility] [place]   In 1 month Gareth Mliss FALCON, FNP [facility]               Requested Prescriptions  Pending Prescriptions Disp Refills   cyclobenzaprine  (FLEXERIL ) 10 MG tablet 30 tablet 0    Sig: Take 0.5-1 tablets (5-10 mg total) by mouth 3 (three) times daily as needed.     Not Delegated - Analgesics:  Muscle Relaxants Failed - 08/08/2023  1:21 PM      Failed - This refill cannot be delegated      Passed - Valid encounter within last 6 months    Recent Outpatient Visits           3 weeks ago Class 3 severe obesity due to excess calories with serious comorbidity and body mass index (BMI) of 50.0 to 59.9 in adult   [facility] Gareth Mliss FALCON, FNP   1 month ago Anxiety   [facility] Gareth Mliss FALCON, FNP   4 months ago Left ear pain   [facility] Gareth Mliss FALCON, FNP   5 months ago Class 3 severe obesity due to excess calories with serious comorbidity and body mass index (BMI) of 50.0 to 59.9 in adult [facility])   [facility] Gareth Mliss FALCON, FNP   4 years ago Blood pressure check    [facility] Celestia Rosaline SQUIBB, NP       Future Appointments  Today  [facility] [employer]   In 1 month Gareth Mliss FALCON, FNP [facility]            Signed Prescriptions Disp Refills   cetirizine  (ZYRTEC ) 10 MG tablet 30 tablet 0    Sig: Take 1 tablet (10 mg total) by mouth  daily.     Ear, Nose, and Throat:  Antihistamines 2 Passed - 08/08/2023  1:21 PM      Passed - Cr in normal range and within 360 days    Creatinine, Ser  Date Value Ref Range Status  04/19/2023 0.82 0.57 - 1.00 mg/dL Final   Creatinine, Urine  Date Value Ref Range Status  05/18/2021 52 mg/dL Final         Passed - Valid encounter within last 12 months    Recent Outpatient Visits           3 weeks ago Class 3 severe obesity due to excess calories with serious comorbidity and body mass index (BMI) of 50.0 to 59.9 in adult   [facility] Gareth Mliss FALCON, FNP   1 month ago Anxiety   [facility] Gareth Mliss F, FNP   4 months ago Left ear pain   [place] [facility] Gareth Mliss F, FNP   5 months ago Class 3 severe obesity due to excess calories with serious comorbidity and body mass index (BMI) of 50.0 to 59.9 in adult [facility])   [facility] Gareth Mliss FALCON, FNP   4 years ago Blood pressure check   [place] [facility] Celestia Rosaline SQUIBB, NP       Future Appointments             Today  [facility] [place]   In 1 month Gareth Mliss FALCON, FNP [facility]             sertraline  (ZOLOFT ) 100 MG tablet 30 tablet 0    Sig: Take 1 tablet (100 mg total) by mouth daily.     Psychiatry:  Antidepressants - SSRI - sertraline  Passed - 08/08/2023  1:21 PM      Passed - AST in normal range and within 360 days    AST  Date Value Ref Range Status  04/19/2023 19 0 - 40 IU/L Final         Passed - ALT in normal range and within 360 days    ALT  Date Value Ref Range Status  04/19/2023 23 0 - 32 IU/L Final         Passed - Completed PHQ-2 or PHQ-9 in the last 360 days      Passed - Valid encounter within last 6 months    Recent Outpatient Visits           3 weeks ago Class 3 severe obesity due to excess calories with  serious comorbidity and body mass index (BMI) of 50.0 to 59.9 in adult   [facility] Gareth Mliss FALCON, FNP   1 month ago Anxiety   [facility] Gareth Mliss FALCON, FNP   4 months ago Left ear pain   [place] [facility] Gareth Mliss F, FNP   5 months ago Class 3 severe obesity due to excess calories with serious comorbidity and body mass index (BMI) of 50.0 to 59.9 in adult (  [facility])   [facility] Gareth Clarity F, FNP   4 years ago Blood pressure check   [place] [facility] Celestia Rosaline SQUIBB, NP       Future Appointments             Today  [facility] [place]   In 1 month Gareth, Clarity FALCON, FNP [facility], [facility]

## 2023-08-08 NOTE — Telephone Encounter (Signed)
 Requested Prescriptions  Pending Prescriptions Disp Refills   cetirizine  (ZYRTEC ) 10 MG tablet 30 tablet 0    Sig: Take 1 tablet (10 mg total) by mouth daily.     Ear, Nose, and Throat:  Antihistamines 2 Passed - 08/08/2023  1:20 PM      Passed - Cr in normal range and within 360 days    Creatinine, Ser  Date Value Ref Range Status  04/19/2023 0.82 0.57 - 1.00 mg/dL Final   Creatinine, Urine  Date Value Ref Range Status  05/18/2021 52 mg/dL Final         Passed - Valid encounter within last 12 months    Recent Outpatient Visits           3 weeks ago Class 3 severe obesity due to excess calories with serious comorbidity and body mass index (BMI) of 50.0 to 59.9 in adult   [facility] Gareth Mliss FALCON, FNP   1 month ago Anxiety   [facility] Gareth Mliss F, FNP   4 months ago Left ear pain   [place] [facility] Gareth Mliss F, FNP   5 months ago Class 3 severe obesity due to excess calories with serious comorbidity and body mass index (BMI) of 50.0 to 59.9 in adult [facility])   [facility] Gareth Mliss FALCON, FNP   4 years ago Blood pressure check   [place] [facility] Celestia Rosaline SQUIBB, NP       Future Appointments             Today  [facility] [place]   In 1 month Gareth Mliss FALCON, FNP [place] [facility]             cyclobenzaprine  (FLEXERIL ) 10 MG tablet 30 tablet 0    Sig: Take 0.5-1 tablets (5-10 mg total) by mouth 3 (three) times daily as needed.     Not Delegated - Analgesics:  Muscle Relaxants Failed - 08/08/2023  1:20 PM      Failed - This refill cannot be delegated      Passed - Valid encounter within last 6 months    Recent Outpatient Visits           3 weeks ago Class 3 severe obesity due to excess calories with serious comorbidity and body mass index (BMI) of 50.0 to 59.9 in adult   [facility] Gareth Mliss FALCON, FNP   1 month ago Anxiety   [facility] Gareth Mliss F, FNP   4 months ago Left ear pain   [place] Gareth Mliss F, FNP   5 months ago Class 3 severe obesity due to excess calories with serious comorbidity and body mass index (BMI) of 50.0 to 59.9 in adult [facility])   [facility] Gareth Mliss FALCON, FNP   4 years ago Blood pressure check   [place] [facility] Celestia Rosaline SQUIBB, NP       Future Appointments             Today  [place] [facility] [place]   In 1 month Gareth Mliss FALCON, FNP [facility]             sertraline  (ZOLOFT ) 100 MG tablet 30 tablet 0    Sig: Take 1 tablet (100 mg  total) by mouth daily.     Psychiatry:  Antidepressants - SSRI - sertraline  Passed - 08/08/2023  1:20 PM      Passed - AST in normal range and within 360 days    AST  Date Value Ref Range Status  04/19/2023 19 0 - 40 IU/L Final         Passed - ALT in normal range and within 360 days    ALT  Date Value Ref Range Status  04/19/2023 23 0 - 32 IU/L Final         Passed - Completed PHQ-2 or PHQ-9 in the last 360 days      Passed - Valid encounter within last 6 months    Recent Outpatient Visits           3 weeks ago Class 3 severe obesity due to excess calories with serious comorbidity and body mass index (BMI) of 50.0 to 59.9 in adult   [facility] Gareth Mliss FALCON, FNP   1 month ago Anxiety   [facility] Gareth Mliss F, FNP   4 months ago Left ear pain   [facility] Gareth Mliss F, FNP   5 months ago Class 3 severe obesity due to excess calories with serious comorbidity and body mass index (BMI) of 50.0 to 59.9 in adult [facility])   [facility] Gareth Mliss FALCON, FNP   4 years ago Blood pressure check    [place] [facility] Celestia Rosaline SQUIBB, NP       Future Appointments             Today  [facility] [place]   In 1 month Gareth, Mliss FALCON, FNP [facility], [facility]

## 2023-08-08 NOTE — ED Triage Notes (Addendum)
 Pt present with c/o sharp and dull lt lower back pain. States the pain radiates to the lt ankle. Pt states it has been going on for 10 days. She tries to stretch for relief. Pt states nothing is helping the pain.

## 2023-08-08 NOTE — ED Provider Notes (Signed)
 [facility]    CSN: [id number] Arrival date & time: 08/08/23  1721      History   Chief Complaint Chief Complaint  Patient presents with   Back Pain    HPI Latoya Stark is a 28 y.o. female.   Patient presents to clinic over concern of a flare-up of her chronic back pain. She was diagnosed with a pinched nerve over 10 years ago. Continued to have back pain and had repeat imaging last year that showed a bulging disc at L5and pinched nerve. Followed by [facility], has tried NSAIDs, Tylenol , oral steroids and a spinal steroid injection without improvement. Ortho recommended surgery, patient declined. Tried tramadol  in the past without improvement. Tried muscle relaxer without help.   Pain has been worse over the past 10 days, tossing and turning in bed, cannot get comfortable or go to sleep. No recent trauma, falls or injuries.  On final days of DepoMedrol dose pack for foot issues, wearing boot on left foot in clinic, started wearing this last week. Has not been taking Mobic  while on this steroid.   Pain is midline and will radiate into her left knee / foot area. Making it difficult to do her job.   Denies urinary symptoms.    The history is provided by the patient and medical records.  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   started at age 41   Headache    Obesity    Pneumonia    Postpartum depression, postpartum condition  08/11/2021   Seizures (HCC)    when I get too hot   Trichomonas infection    Umbilical hernia 04/2022   UTI (lower urinary tract infection)    Vulvar abscess 08/11/2021    Patient Active Problem List   Diagnosis Date Noted   Class 3 severe obesity due to excess calories with serious comorbidity and body mass index (BMI) of 50.0 to 59.9 in adult 02/15/2023   Mild persistent asthma without complication 11/24/2022   Anemia 11/24/2022   Mild episode of recurrent major depressive disorder (HCC) 11/24/2022   Anxiety 11/24/2022   Lipoma of neck 11/16/2022   Dysphagia 11/10/2022   Chronic migraine without aura without status migrainosus, not intractable 06/22/2020   BMI 50.0-59.9, adult (HCC) 02/20/2019   Conversion disorder 02/10/2019   History of seizure 07/30/2018   Other constipation 02/24/2014    Past Surgical History:  Procedure Laterality Date   COLONOSCOPY N/A 05/22/2023   Procedure: COLONOSCOPY;  Surgeon: Therisa Bi, MD;  Location: [facility] ENDOSCOPY;  Service: Gastroenterology;  Laterality: N/A;   DILATION AND EVACUATION N/A 08/09/2013   Procedure: DILATATION AND EVACUATION;  Surgeon: Aida DELENA Na, MD;  Location: [facility] ORS;  Service: Gynecology;  Laterality: N/A;   DILATION AND EVACUATION  2017   ESOPHAGOGASTRODUODENOSCOPY (EGD) WITH PROPOFOL  N/A 11/10/2022   Procedure: ESOPHAGOGASTRODUODENOSCOPY (EGD) WITH PROPOFOL ;  Surgeon:  Therisa Bi, MD;  Location: [facility] ENDOSCOPY;  Service: Gastroenterology;  Laterality: N/A;   EUSTACHIAN TUBE DILATION     EXCISION MASS NECK N/A 11/16/2022   Procedure: EXCISION MASS NECK;  Surgeon: Jordis Laneta FALCON, MD;  Location: [facility] ORS;  Service: General;  Laterality: N/A;  JP drain placed   LACERATION REPAIR Left 2007   5th toe   ORTHOPEDIC SURGERY Left 2010   hand fracture; screws in place   TONSILLECTOMY AND ADENOIDECTOMY  2001   TYMPANOSTOMY TUBE PLACEMENT Bilateral 2002   UMBILICAL HERNIA REPAIR N/A 05/12/2022   Procedure: LAPAROSCOPIC  ASSISTED SUPRA UMBILICAL HERNIA REPAIR;  Surgeon: Gladis Cough, MD;  Location: [facility] ORS;  Service: General;  Laterality: N/A;    OB History     Gravida  8   Para  2   Term  2   Preterm      AB  6   Living  2      SAB  4   IAB  1   Ectopic      Multiple  0   Live Births  2            Home Medications    Prior to Admission medications   Medication Sig Start Date End Date Taking? Authorizing Provider  albuterol  (VENTOLIN  HFA) 108 (90 Base) MCG/ACT inhaler Inhale 1-2 puffs into the lungs every 6 (six) hours as needed for wheezing or shortness of breath. 11/24/22   Gareth Mliss FALCON, FNP  busPIRone  (BUSPAR ) 5 MG tablet Take 1 tablet (5 mg total) by mouth 3 (three) times daily. 07/06/23   Gareth Mliss FALCON, FNP  cetirizine  (ZYRTEC ) 10 MG tablet Take 1 tablet (10 mg total) by mouth daily. 08/08/23   Pender, [name] F, FNP  cyclobenzaprine  (FLEXERIL ) 10 MG tablet Take 0.5-1 tablets (5-10 mg total) by mouth 3 (three) times daily as needed. 08/08/23   Pender, [name] F, FNP  EPINEPHrine  (EPIPEN  2-PAK) 0.3 mg/0.3 mL IJ SOAJ injection Inject 0.3 mg into the muscle once as needed (for severe allergic reaction). CAll 911 immediately if you have to use this medicine 06/18/23   Pender, [name] F, FNP  gabapentin  (NEURONTIN ) 100 MG capsule Take 2 capsules (200 mg total) by mouth at bedtime. 08/07/23   Lomax, Amy, NP  hydrOXYzine  (VISTARIL ) 25 MG capsule TAKE 1 CAPSULE (25 MG TOTAL) BY MOUTH AT BEDTIME AS NEEDED FOR ANXIETY. 02/21/23   Pender, [name] F, FNP  ibuprofen  (IBU) 800 MG tablet Take 1 tablet (800 mg total) by mouth every 8 (eight) hours as needed for moderate pain (pain score 4-6). 01/18/23   Pender, [name] F, FNP  lamoTRIgine  (LAMICTAL ) 200 MG tablet Take 1 tablet (200 mg total) by mouth 3 (three) times daily. 05/03/23   Lomax, Amy, NP  levETIRAcetam  (KEPPRA ) 750 MG tablet Take 2 tablets (1,500 mg total) by mouth 2 (two) times daily. 05/03/23   Lomax, Amy, NP  meloxicam  (MOBIC ) 7.5 MG tablet Take  1 tablet (7.5 mg total) by mouth daily. 03/09/23     methylPREDNISolone  (MEDROL  DOSEPAK) 4 MG TBPK tablet Take as directed 08/02/23   Gershon Cough SAUNDERS, DPM  sertraline  (ZOLOFT ) 100 MG tablet Take 1 tablet (100 mg total) by mouth daily. 08/08/23   Pender, [name] F, FNP  SYMBICORT  80-4.5 MCG/ACT inhaler Inhale 2 puffs into the lungs 2 (two) times daily as needed (shortness of breath). 11/24/22   Pender, [name] F, FNP  tirzepatide  (ZEPBOUND ) 2.5 MG/0.5ML Pen Inject 2.5 mg into the skin once a  week. 07/17/23   Gareth Mliss FALCON, FNP    Family History Family History  Problem Relation Age of Onset   Depression Mother    Heart disease Mother    Cancer Mother        Bone cancer   Seizures Mother    Stroke Mother    Sickle cell anemia Mother    Thyroid  disease Mother    Heart disease Father    Hypertension Father    Seizures Maternal Aunt    Diabetes Maternal Grandmother    Colon cancer Maternal Grandmother    Hypertension Maternal Grandmother    Clotting disorder Maternal Grandmother    Breast cancer Maternal Grandmother    Stroke Maternal Grandmother    Diabetes Paternal Grandmother     Social History Social History   Tobacco Use   Smoking status: Never    Passive exposure: Never   Smokeless tobacco: Never  Vaping Use   Vaping status: Never Used  Substance Use Topics   Alcohol use: Yes    Comment: occassional   Drug use: No     Allergies   Hydrocodone , Milk-related compounds, Mushroom extract complex (obsolete), Penicillins, Cheese, Latex, and Sulfa antibiotics   Review of Systems Review of Systems  Per HPI  Physical Exam Triage Vital Signs ED Triage Vitals  Encounter Vitals Group     BP 08/08/23 1743 127/89     Girls Systolic BP Percentile --      Girls Diastolic BP Percentile --      Boys Systolic BP Percentile --      Boys Diastolic BP Percentile --      Pulse Rate 08/08/23 1743 88     Resp 08/08/23 1743 17     Temp 08/08/23 1743 98.1 F (36.7 C)     Temp  Source 08/08/23 1743 Oral     SpO2 08/08/23 1743 95 %     Weight --      Height --      Head Circumference --      Peak Flow --      Pain Score 08/08/23 1742 8     Pain Loc --      Pain Education --      Exclude from Growth Chart --    No data found.  Updated Vital Signs BP 127/89 (BP Location: Left Arm)   Pulse 88   Temp 98.1 F (36.7 C) (Oral)   Resp 17   SpO2 95%   Visual Acuity Right Eye Distance:   Left Eye Distance:   Bilateral Distance:    Right Eye Near:   Left Eye Near:    Bilateral Near:     Physical Exam Vitals and nursing note reviewed.  Constitutional:      Appearance: Normal appearance. She is obese.  HENT:     Head: Normocephalic and atraumatic.     Right Ear: External ear normal.     Left Ear: External ear normal.     Nose: Nose normal.     Mouth/Throat:     Mouth: Mucous membranes are moist.  Eyes:     Conjunctiva/sclera: Conjunctivae normal.  Cardiovascular:     Rate and Rhythm: Normal rate.  Pulmonary:     Effort: Pulmonary effort is normal. No respiratory distress.  Musculoskeletal:        General: Tenderness present. No deformity or signs of injury.       Back:  Skin:    General: Skin is warm and dry.  Findings: No rash.  Neurological:     General: No focal deficit present.     Mental Status: She is alert and oriented to person, place, and time.  Psychiatric:        Mood and Affect: Mood normal.        Behavior: Behavior normal.      UC Treatments / Results  Labs (all labs ordered are listed, but only abnormal results are displayed) Labs Reviewed - No data to display  EKG   Radiology No results found.  Procedures Procedures (including critical care time)  Medications Ordered in UC Medications  dexamethasone  (DECADRON ) injection 10 mg (has no administration in time range)    Initial Impression / Assessment and Plan / UC Course  I have reviewed the triage vital signs and the nursing notes.  Pertinent labs &  imaging results that were available during my care of the patient were reviewed by me and considered in my medical decision making (see chart for details).  Vitals and triage reviewed, patient is hemodynamically stable. Midline lumbar back pain, chronic w/ known L5 bulging disc and pinched nerve. Atraumatic, imaging deferred.  Will tril IM steroid. Ortho and pain management follow-up encouraged. Patient verbalized understanding, no questions at this time.      Final Clinical Impressions(s) / UC Diagnoses   Final diagnoses:  Chronic midline low back pain, unspecified whether sciatica present     Discharge Instructions      We will try a steroid injection today in clinic to help with pain and inflammation. Starting tomorrow you can resume NSAIDs (Mobic  7.5 mg daily).   Please follow-up with orthopedics and consider pain management for your chronic back pain.      ED Prescriptions   None    I have reviewed the PDMP during this encounter.   Dreama, Mathew Storck  N, FNP 08/08/23 [phone]

## 2023-08-10 ENCOUNTER — Emergency Department (HOSPITAL_BASED_OUTPATIENT_CLINIC_OR_DEPARTMENT_OTHER)
Admission: EM | Admit: 2023-08-10 | Discharge: 2023-08-10 | Disposition: A | Discharge status: Home / Self Care | Attending: Emergency Medicine | Admitting: Emergency Medicine

## 2023-08-10 ENCOUNTER — Emergency Department (HOSPITAL_BASED_OUTPATIENT_CLINIC_OR_DEPARTMENT_OTHER)

## 2023-08-10 ENCOUNTER — Ambulatory Visit: Admitting: Nurse Practitioner

## 2023-08-10 ENCOUNTER — Other Ambulatory Visit: Payer: Self-pay

## 2023-08-10 ENCOUNTER — Encounter (HOSPITAL_BASED_OUTPATIENT_CLINIC_OR_DEPARTMENT_OTHER): Payer: Self-pay

## 2023-08-10 DIAGNOSIS — G40909 Epilepsy, unspecified, not intractable, without status epilepticus: Secondary | ICD-10-CM | POA: Insufficient documentation

## 2023-08-10 DIAGNOSIS — R519 Headache, unspecified: Secondary | ICD-10-CM | POA: Insufficient documentation

## 2023-08-10 DIAGNOSIS — R569 Unspecified convulsions: Secondary | ICD-10-CM | POA: Diagnosis present

## 2023-08-10 DIAGNOSIS — J45909 Unspecified asthma, uncomplicated: Secondary | ICD-10-CM | POA: Insufficient documentation

## 2023-08-10 LAB — CBC
HCT: 40.9 % (ref 36.0–46.0)
Hemoglobin: 13.1 g/dL (ref 12.0–15.0)
MCH: 28.4 pg (ref 26.0–34.0)
MCHC: 32 g/dL (ref 30.0–36.0)
MCV: 88.5 fL (ref 80.0–100.0)
Platelets: 371 K/uL (ref 150–400)
RBC: 4.62 MIL/uL (ref 3.87–5.11)
RDW: 13.2 % (ref 11.5–15.5)
WBC: 8.2 K/uL (ref 4.0–10.5)
nRBC: 0 % (ref 0.0–0.2)

## 2023-08-10 LAB — BASIC METABOLIC PANEL WITH GFR
Anion gap: 12 (ref 5–15)
BUN: 14 mg/dL (ref 6–20)
CO2: 25 mmol/L (ref 22–32)
Calcium: 9.2 mg/dL (ref 8.9–10.3)
Chloride: 103 mmol/L (ref 98–111)
Creatinine, Ser: 0.79 mg/dL (ref 0.44–1.00)
GFR, Estimated: >60 mL/min (ref >60)
Glucose, Bld: 73 mg/dL (ref 70–99)
Potassium: 3.8 mmol/L (ref 3.5–5.1)
Sodium: 140 mmol/L (ref 135–145)

## 2023-08-10 MED ORDER — OXYCODONE-ACETAMINOPHEN 5-325 MG PO TABS
1.0000 | ORAL_TABLET | Freq: Once | ORAL | Status: AC
  Administered 2023-08-10: 1 via ORAL
  Filled 2023-08-10: qty 1

## 2023-08-10 MED ORDER — KETOROLAC TROMETHAMINE 15 MG/ML IJ SOLN
15.0000 mg | Freq: Once | INTRAMUSCULAR | Status: AC
  Administered 2023-08-10: 15 mg via INTRAVENOUS
  Filled 2023-08-10: qty 1

## 2023-08-10 MED ORDER — SODIUM CHLORIDE 0.9 % IV BOLUS
1000.0000 mL | Freq: Once | INTRAVENOUS | Status: AC
  Administered 2023-08-10: 1000 mL via INTRAVENOUS

## 2023-08-10 MED ORDER — DEXAMETHASONE SODIUM PHOSPHATE 10 MG/ML IJ SOLN
10.0000 mg | Freq: Once | INTRAMUSCULAR | Status: AC
Start: 2023-08-10 — End: 2023-08-10
  Administered 2023-08-10: 10 mg via INTRAVENOUS
  Filled 2023-08-10: qty 1

## 2023-08-10 MED ORDER — ONDANSETRON HCL 4 MG/2ML IJ SOLN
4.0000 mg | Freq: Once | INTRAMUSCULAR | Status: AC
  Administered 2023-08-10: 4 mg via INTRAVENOUS
  Filled 2023-08-10: qty 2

## 2023-08-10 MED ORDER — OXYCODONE HCL 5 MG PO TABS: 5.0000 mg | ORAL_TABLET | Freq: Four times a day (QID) | ORAL | 0 refills | Status: DC | PRN

## 2023-08-10 NOTE — ED Notes (Signed)
 Pt d/c instructions, medications, and follow-up care reviewed with pt and family. Pt verbalized understanding and had no further questions at time of d/c. Pt CA&Ox4, ambulatory, and in NAD at time of d/c

## 2023-08-10 NOTE — ED Provider Notes (Signed)
 [place] EMERGENCY DEPARTMENT AT [facility] Provider Note   CSN: [id number] Arrival date & time: 08/10/23  1412     Patient presents with: Headache and Seizures (Last night x2)   Latoya Stark is a 28 y.o. female.   Patient with history of migraines, epilepsy, obesity presents today with complaints of headache, seizures. Reports that her headache came on gradually at the beginning of the week and has been persistent since then. Reports this feels like her chronic migraines. Pain is right sided, endorses some nausea without vomiting. She has been trying her home medications for management of her headache without significant relief. Also reports that she was told she had 2 seizures last night. Her significant other at the bedside notes that the first was witnessed by young children that were present and the second was witnessed by him. He is unsure of duration but does note that she returned to baseline in between the first and second seizure.  She did bite her tongue during the episode. Has not had a seizure since last night. She reports that she has been complaint with her seizure medications. Does report that she has been more stressed recently and has not slept as well. Reports that she has not had a breakthrough seizure in about 1 year. She has not seen her neurologist in 1 year. Currently reports continuation of her migraine, denies any other symptoms.  Specifically, denies fevers, chills, vision changes, neck stiffness, chest pain, shortness of breath.  The history is provided by the patient. No language interpreter was used.  Headache Associated symptoms: seizures   Seizures      Prior to Admission medications   Medication Sig Start Date End Date Taking? Authorizing Provider  clindamycin  (CLEOCIN ) 300 MG capsule Take 300 mg by mouth every 6 (six) hours. 07/30/23  Yes [provider]  albuterol  (VENTOLIN  HFA) 108 (90 Base) MCG/ACT inhaler Inhale 1-2 puffs into the  lungs every 6 (six) hours as needed for wheezing or shortness of breath. 11/24/22   Gareth Mliss FALCON, FNP  busPIRone  (BUSPAR ) 5 MG tablet Take 1 tablet (5 mg total) by mouth 3 (three) times daily. 07/06/23   Gareth Mliss FALCON, FNP  cetirizine  (ZYRTEC ) 10 MG tablet Take 1 tablet (10 mg total) by mouth daily. 08/08/23   Pender, [name] F, FNP  cyclobenzaprine  (FLEXERIL ) 10 MG tablet Take 0.5-1 tablets (5-10 mg total) by mouth 3 (three) times daily as needed. 08/08/23   Pender, [name] F, FNP  EPINEPHrine  (EPIPEN  2-PAK) 0.3 mg/0.3 mL IJ SOAJ injection Inject 0.3 mg into the muscle once as needed (for severe allergic reaction). CAll 911 immediately if you have to use this medicine 06/18/23   Pender, [name] F, FNP  gabapentin  (NEURONTIN ) 100 MG capsule Take 2 capsules (200 mg total) by mouth at bedtime. 08/07/23   Lomax, Amy, NP  hydrOXYzine  (VISTARIL ) 25 MG capsule TAKE 1 CAPSULE (25 MG TOTAL) BY MOUTH AT BEDTIME AS NEEDED FOR ANXIETY. 02/21/23   Pender, [name] F, FNP  ibuprofen  (IBU) 800 MG tablet Take 1 tablet (800 mg total) by mouth every 8 (eight) hours as needed for moderate pain (pain score 4-6). 01/18/23   Pender, [name] F, FNP  lamoTRIgine  (LAMICTAL ) 200 MG tablet Take 1 tablet (200 mg total) by mouth 3 (three) times daily. 05/03/23   Lomax, Amy, NP  levETIRAcetam  (KEPPRA ) 750 MG tablet Take 2 tablets (1,500 mg total) by mouth 2 (two) times daily. 05/03/23   Lomax, Amy, NP  meloxicam  (MOBIC ) 7.5  MG tablet Take 1 tablet (7.5 mg total) by mouth daily. 03/09/23     methylPREDNISolone  (MEDROL  DOSEPAK) 4 MG TBPK tablet Take as directed 08/02/23   Gershon Donnice SAUNDERS, DPM  sertraline  (ZOLOFT ) 100 MG tablet Take 1 tablet (100 mg total) by mouth daily. 08/08/23   Pender, [name] F, FNP  SYMBICORT  80-4.5 MCG/ACT inhaler Inhale 2 puffs into the lungs 2 (two) times daily as needed (shortness of breath). 11/24/22   Gareth Mliss FALCON, FNP  tirzepatide  (ZEPBOUND ) 2.5 MG/0.5ML Pen Inject 2.5 mg into the skin once a week. 07/17/23   Pender,  [name] F, FNP    Allergies: Hydrocodone , Milk-related compounds, Mushroom extract complex (obsolete), Penicillins, Cheese, Latex, and Sulfa antibiotics    Review of Systems  Neurological:  Positive for seizures and headaches.  All other systems reviewed and are negative.   Updated Vital Signs BP 97/69   Pulse 68   Temp (!) 97.2 F (36.2 C)   Resp 20   SpO2 100%   Physical Exam Vitals and nursing note reviewed.  Constitutional:      General: She is not in acute distress.    Appearance: Normal appearance. She is normal weight. She is not ill-appearing, toxic-appearing or diaphoretic.  HENT:     Head: Normocephalic and atraumatic.  Neck:     Comments: No meningismus Cardiovascular:     Rate and Rhythm: Normal rate.  Pulmonary:     Effort: Pulmonary effort is normal. No respiratory distress.  Musculoskeletal:        General: Normal range of motion.     Cervical back: Normal range of motion and neck supple.  Skin:    General: Skin is warm and dry.  Neurological:     General: No focal deficit present.     Mental Status: She is alert and oriented to person, place, and time.     GCS: GCS eye subscore is 4. GCS verbal subscore is 5. GCS motor subscore is 6.     Sensory: Sensation is intact.     Motor: Motor function is intact.     Coordination: Coordination is intact.     Gait: Gait is intact.     Comments: Alert and oriented to self, place, time and event.    Speech is fluent, clear without dysarthria or dysphasia.    Strength 5/5 in upper/lower extremities   Sensation intact in upper/lower extremities    CN I not tested  CN II grossly intact visual fields bilaterally. Did not visualize posterior eye.  CN III, IV, VI PERRLA and EOMs intact bilaterally  CN V Intact sensation to sharp and light touch to the face  CN VII facial movements symmetric  CN VIII not tested  CN IX, X no uvula deviation, symmetric rise of soft palate  CN XI 5/5 SCM and trapezius strength  bilaterally  CN XII Midline tongue protrusion, symmetric L/R movements   Psychiatric:        Mood and Affect: Mood normal.        Behavior: Behavior normal.     (all labs ordered are listed, but only abnormal results are displayed) Labs Reviewed  CBC  BASIC METABOLIC PANEL WITH GFR    EKG: None  Radiology: CT Head Wo Contrast Result Date: 08/10/2023 CLINICAL DATA:  Headache, increasing frequency or severity EXAM: CT HEAD WITHOUT CONTRAST TECHNIQUE: Contiguous axial images were obtained from the base of the skull through the vertex without intravenous contrast. RADIATION DOSE REDUCTION: This exam was performed according  to the departmental dose-optimization program which includes automated exposure control, adjustment of the mA and/or kV according to patient size and/or use of iterative reconstruction technique. COMPARISON:  MRI head November 09, 2020 and CT head June 16, 2020. FINDINGS: Brain: No evidence of acute infarction, hemorrhage, hydrocephalus, extra-axial collection or mass lesion/mass effect. Vascular: No hyperdense vessel identified. Skull: No acute fracture. Sinuses/Orbits: Largely clear sinuses.  No acute orbital findings. Other: No mastoid effusions. IMPRESSION: Normal head CT.  No acute abnormality. Electronically Signed   By: Gilmore GORMAN Molt M.D.   On: 08/10/2023 15:05     Procedures   Medications Ordered in the ED  ondansetron  (ZOFRAN ) injection 4 mg (4 mg Intravenous Given 08/10/23 1639)  ketorolac  (TORADOL ) 15 MG/ML injection 15 mg (15 mg Intravenous Given 08/10/23 1640)  dexamethasone  (DECADRON ) injection 10 mg (10 mg Intravenous Given 08/10/23 1641)  oxyCODONE -acetaminophen  (PERCOCET/ROXICET) 5-325 MG per tablet 1 tablet (1 tablet Oral Given 08/10/23 1615)  sodium chloride  0.9 % bolus 1,000 mL (0 mLs Intravenous Stopped 08/10/23 1741)                                    Medical Decision Making Amount and/or Complexity of Data Reviewed Labs: ordered. Radiology:  ordered.  Risk Prescription drug management.   This patient is a 28 y.o. female who presents to the ED for concern of headache, seizure, this involves an extensive number of treatment options, and is a complaint that carries with it a high risk of complications and morbidity. The emergent differential diagnosis prior to evaluation includes, but is not limited to,  The differential diagnosis for includes but is not limited to   Seizure: idiopathic seizure, traumatic brain injury, intracranial hemorrhage, vascular lesion, mass or space containing lesion, degenerative neurologic disease, congenital brain abnormality, infectious etiology such as meningitis, encephalitis or abscess, metabolic disturbance including hyper or hypoglycemia, hyper or hyponatremia, hyperosmolar state, uremia, hepatic failure, hypocalcemia, hypomagnesemia.  Toxic substances such as cocaine, lidocaine , antidepressants, theophylline, alcohol withdrawal, drug withdrawal, eclampsia, hypertensive encephalopathy and anoxic brain injury.  Headache: Stroke, increased ICP, meningitis, CVA, intracranial tumor, venous sinus thrombosis, migraine, cluster headache, hypertension, drug related, head injury, tension headache, sinusitis, otitis media, TMJ, glaucoma, trigeminal neuralgia.   This is not an exhaustive differential.   Past Medical History / Co-morbidities / Social History:  has a past medical history of ADHD (attention deficit hyperactivity disorder), Anemia, Anxiety, Asthma, Back pain, Chronic migraine (08/01/2018), Constipation, Depression, Epilepsy (HCC), Family history of adverse reaction to anesthesia, GERD (gastroesophageal reflux disease), Gestational diabetes, Gestational diabetes mellitus (GDM) in third trimester (04/19/2021), Gonorrhea in female (10/05/2013), 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) (08/01/2018), Headache, Obesity, Pneumonia, Postpartum depression, postpartum condition (08/11/2021), Seizures (HCC), Trichomonas  infection, Umbilical hernia (04/2022), UTI (lower urinary tract infection), and Vulvar abscess (08/11/2021).  Additional history: Chart reviewed. Pertinent results include: Follows with [facility] for management of her migraines and seizures.  Is prescribed lamotrigine  and Keppra .  Physical Exam: Physical exam performed. The pertinent findings include: Alert and oriented and neurologically intact without focal deficits  Lab Tests: I ordered, and personally interpreted labs.  The pertinent results include: No acute laboratory abnormalities   Imaging Studies: I ordered imaging studies including CT head. I independently visualized and interpreted imaging which showed no acute findings. I agree with the radiologist interpretation.   Medications: I ordered medication including decadron , toradol , zofran , percocet, fluids  for headache. Reevaluation of the patient  after these medicines showed that the patient improved. I have reviewed the patients home medicines and have made adjustments as needed.   Disposition: After consideration of the diagnostic results and the patients response to treatment, I feel that emergency department workup does not suggest an emergent condition requiring admission or immediate intervention beyond what has been performed at this time. The plan is: discharge with close outpatient neurology follow-up with return precautions.  Patient reports she has been under significant stress and has not been sleeping, likely etiology of her breakthrough seizures.  She has not had any seizure-like activity in approximately 24 hours now.  Recommend close neurology follow-up.  Emphasized importance of continued medication adherence.  Her headache has significantly improved with above interventions, is consistent in nature to her chronic migraines.  Recommend she discuss management of this with her neurologist at her next appointment as well. Evaluation and diagnostic testing in the  emergency department does not suggest an emergent condition requiring admission or immediate intervention beyond what has been performed at this time.  Plan for discharge with close PCP follow-up.  Patient is understanding and amenable with plan, educated on red flag symptoms that would prompt immediate return.  Patient discharged in stable condition.   I discussed this case with my attending physician Dr. Doretha who cosigned this note including patient's presenting symptoms, physical exam, and planned diagnostics and interventions. Attending physician stated agreement with plan or made changes to plan which were implemented.    Final diagnoses:  Bad headache  Seizure [facility])    ED Discharge Orders     None     An After Visit Summary was printed and given to the patient.      Nora Lauraine DELENA DEVONNA 08/10/23 1918    Doretha Folks, MD 08/10/23 2242

## 2023-08-10 NOTE — Discharge Instructions (Signed)
 As we discussed, your workup in the ER today was reassuring for acute findings.  Given that you have been under more stress and have not been sleeping as well, I suspect that is why you had a breakthrough seizure.  I recommend that you follow-up closely with your neurologist for continued evaluation and management of your symptoms.  I have given you a prescription for a few doses of oxycodone  that you can take as prescribed as needed for severe pain only.  Do not drive or operate machinery while taking this medication as it can be sedating.  You can take your home migraine medicine as well for management of the symptoms.  Call your PCP for follow-up as well.  Return if development of any new or worsening symptoms.

## 2023-08-10 NOTE — ED Triage Notes (Signed)
 Pt c/o migraine HA, visitor advises 2 seizures last night & she didn't know, we're just here to check her & make sure she's good.   Compliant w home seizure meds

## 2023-08-14 ENCOUNTER — Telehealth: Payer: Self-pay | Admitting: Family Medicine

## 2023-08-14 NOTE — Telephone Encounter (Signed)
 Patient had seizure on Friday, 08/10/23; the children found patient having seizure in her sleep, boyfriend witnessed second seizure: then 10 minutes later had another seizure. She began rocking, eyes rolled back in head, kept talking. Seizure lasted 5 minutes. After seizure patient complained of tongue and head was hurting. Family took patient to the ER at [facility]. Would like a call back to discuss if have earlier appointment or can be worked in.

## 2023-08-14 NOTE — Telephone Encounter (Signed)
 Called pt at [phone]. Rang once and went to VM. LVM for her to call.

## 2023-08-14 NOTE — Telephone Encounter (Signed)
 Dr. Vear- Amy has appt we can schedule pt in next week for f/u. Wanted you to review first to see if you wanted to see pt this week or ok to wait until next week?

## 2023-08-14 NOTE — Telephone Encounter (Signed)
 Took call from phone room and spoke w/ pt. Scheduled appt with AL,NP 08/21/23 at 10am. Confirmed she has been taking seizure meds as prescribed and has not missed any doses. Encouraged her to continue taking as prescribed. Went over that she cannot drive until 6 months seizure free, this is [place] Social worker. She will have someone drive her to and from appt.

## 2023-08-14 NOTE — Telephone Encounter (Signed)
 Pt last seen 10/09/22 by AL,NP. Primary MD Dr. Vear.

## 2023-08-14 NOTE — Telephone Encounter (Signed)
 Pt has returned call to RN

## 2023-08-15 ENCOUNTER — Other Ambulatory Visit: Payer: Self-pay

## 2023-08-15 ENCOUNTER — Other Ambulatory Visit: Payer: Self-pay | Admitting: Nurse Practitioner

## 2023-08-15 DIAGNOSIS — F330 Major depressive disorder, recurrent, mild: Secondary | ICD-10-CM

## 2023-08-15 DIAGNOSIS — F419 Anxiety disorder, unspecified: Secondary | ICD-10-CM

## 2023-08-15 DIAGNOSIS — Z6841 Body Mass Index (BMI) 40.0 and over, adult: Secondary | ICD-10-CM

## 2023-08-16 ENCOUNTER — Other Ambulatory Visit: Payer: Self-pay

## 2023-08-16 ENCOUNTER — Other Ambulatory Visit: Payer: Self-pay | Admitting: Nurse Practitioner

## 2023-08-16 ENCOUNTER — Other Ambulatory Visit: Payer: Self-pay | Admitting: *Deleted

## 2023-08-16 DIAGNOSIS — F419 Anxiety disorder, unspecified: Secondary | ICD-10-CM

## 2023-08-16 DIAGNOSIS — G40409 Other generalized epilepsy and epileptic syndromes, not intractable, without status epilepticus: Secondary | ICD-10-CM

## 2023-08-16 DIAGNOSIS — F330 Major depressive disorder, recurrent, mild: Secondary | ICD-10-CM

## 2023-08-16 DIAGNOSIS — Z6841 Body Mass Index (BMI) 40.0 and over, adult: Secondary | ICD-10-CM

## 2023-08-16 MED ORDER — LAMOTRIGINE 200 MG PO TABS
200.0000 mg | ORAL_TABLET | Freq: Three times a day (TID) | ORAL | 0 refills | Status: AC
  Filled 2023-08-16 – 2024-02-06 (×4): qty 90, 30d supply, fill #0

## 2023-08-16 MED ORDER — LEVETIRACETAM 750 MG PO TABS
1500.0000 mg | ORAL_TABLET | Freq: Two times a day (BID) | ORAL | 0 refills | Status: AC
  Filled 2023-08-16 – 2024-02-06 (×4): qty 120, 30d supply, fill #0

## 2023-08-16 NOTE — Telephone Encounter (Signed)
 Last seen 10/09/22 Follow up scheduled on 08/21/23

## 2023-08-17 ENCOUNTER — Other Ambulatory Visit: Payer: Self-pay

## 2023-08-20 ENCOUNTER — Other Ambulatory Visit: Payer: Self-pay

## 2023-08-20 MED FILL — Tirzepatide (Weight Mngmt) Soln Auto-Injector 2.5 MG/0.5ML: SUBCUTANEOUS | 28 days supply | Qty: 2 | Fill #0 | Status: CN

## 2023-08-20 MED FILL — Meloxicam Tab 7.5 MG: ORAL | 30 days supply | Qty: 30 | Fill #0 | Status: AC

## 2023-08-20 MED FILL — Buspirone HCl Tab 5 MG: ORAL | 30 days supply | Qty: 90 | Fill #0 | Status: AC

## 2023-08-20 NOTE — Telephone Encounter (Signed)
 Appointment 08/23/23 Requested Prescriptions  Pending Prescriptions Disp Refills   busPIRone  (BUSPAR ) 5 MG tablet 90 tablet 0    Sig: Take 1 tablet (5 mg total) by mouth 3 (three) times daily.     Psychiatry: Anxiolytics/Hypnotics - Non-controlled Passed - 08/20/2023  1:27 PM      Passed - Valid encounter within last 12 months    Recent Outpatient Visits           1 month ago Class 3 severe obesity due to excess calories with serious comorbidity and body mass index (BMI) of 50.0 to 59.9 in adult   [facility] Latoya Mliss FALCON, FNP   1 month ago Anxiety   [facility] Latoya Mliss FALCON, FNP   5 months ago Left ear pain   [facility] Latoya Mliss FALCON, FNP   6 months ago Class 3 severe obesity due to excess calories with serious comorbidity and body mass index (BMI) of 50.0 to 59.9 in adult [facility])   [facility] Latoya Mliss FALCON, FNP   4 years ago Blood pressure check   [place] [facility] Latoya Rosaline SQUIBB, NP       Future Appointments             In 3 weeks Latoya Mliss FALCON, FNP [facility], [facility]             meloxicam  (MOBIC ) 7.5 MG tablet 30 tablet 0    Sig: Take 1 tablet (7.5 mg total) by mouth daily.     Analgesics:  COX2 Inhibitors Failed - 08/20/2023  1:27 PM      Failed - Manual Review: Labs are only required if the patient has taken medication for more than 8 weeks.      Passed - HGB in normal range and within 360 days    Hemoglobin  Date Value Ref Range Status  08/10/2023 13.1 12.0 - 15.0 g/dL Final  95/89/7974 87.0 11.1 - 15.9 g/dL Final   Hemoglobin Other  Date Value Ref Range Status  07/30/2013 0.0 0.0 % Final    Comment:      Interpretation -------------- Normal study.   Reviewed by Romero DOROTHA Rav, MD, PhD, FCAP (Electronic Signature on File)         Passed - Cr in normal range and within 360 days     Creatinine, Ser  Date Value Ref Range Status  08/10/2023 0.79 0.44 - 1.00 mg/dL Final   Creatinine, Urine  Date Value Ref Range Status  05/18/2021 52 mg/dL Final         Passed - HCT in normal range and within 360 days    HCT  Date Value Ref Range Status  08/10/2023 40.9 36.0 - 46.0 % Final   Hematocrit  Date Value Ref Range Status  04/19/2023 38.2 34.0 - 46.6 % Final         Passed - AST in normal range and within 360 days    AST  Date Value Ref Range Status  04/19/2023 19 0 - 40 IU/L Final         Passed - ALT in normal range and within 360 days    ALT  Date Value Ref Range Status  04/19/2023 23 0 - 32 IU/L Final         Passed - eGFR is 30 or above and within 360 days    GFR calc Af [place]  Date Value Ref Range Status  02/20/2019 >60 >60 mL/min Final   GFR, Estimated  Date Value Ref Range Status  08/10/2023 >60 >60 mL/min Final    Comment:    (NOTE) Calculated using the CKD-EPI Creatinine Equation (2021)    eGFR  Date Value Ref Range Status  04/19/2023 100 >59 mL/min/1.73 Final         Passed - Patient is not pregnant      Passed - Valid encounter within last 12 months    Recent Outpatient Visits           1 month ago Class 3 severe obesity due to excess calories with serious comorbidity and body mass index (BMI) of 50.0 to 59.9 in adult   [facility] Latoya Mliss FALCON, FNP   1 month ago Anxiety   [facility] Latoya Mliss F, FNP   5 months ago Left ear pain   [facility] Latoya Mliss F, FNP   6 months ago Class 3 severe obesity due to excess calories with serious comorbidity and body mass index (BMI) of 50.0 to 59.9 in adult [facility])   [facility] Latoya Mliss FALCON, FNP   4 years ago Blood pressure check   [place] [facility] Latoya Rosaline SQUIBB, NP       Future Appointments             In 3 weeks Latoya Mliss FALCON, FNP [facility], [facility]             ZEPBOUND  2.5 MG/0.5ML Pen [Pharmacy Med Name: tirzepatide  (ZEPBOUND ) 2.5 MG/0.5ML Pen] 2 mL 0    Sig: Inject 2.5 mg into the skin once a week.     Off-Protocol Failed - 08/20/2023  1:27 PM      Failed - Medication not assigned to a protocol, review manually.      Passed - Valid encounter within last 12 months    Recent Outpatient Visits           1 month ago Class 3 severe obesity due to excess calories with serious comorbidity and body mass index (BMI) of 50.0 to 59.9 in adult   [facility] Latoya Mliss FALCON, FNP   1 month ago Anxiety   [facility] Latoya Mliss FALCON, FNP   5 months ago Left ear pain   [facility] Latoya Mliss FALCON, FNP   6 months ago Class 3 severe obesity due to excess calories with serious comorbidity and body mass index (BMI) of 50.0 to 59.9 in adult [facility])   [facility] Latoya Mliss FALCON, FNP   4 years ago Blood pressure check   [place] [facility] Latoya Rosaline SQUIBB, NP       Future Appointments             In 3 weeks Latoya, Mliss FALCON, FNP [facility], [facility]

## 2023-08-20 NOTE — Telephone Encounter (Signed)
 Requested medication (s) are due for refill today -provider review   Requested medication (s) are on the active medication list -yes  Future visit scheduled -yes  Last refill: Meloxicam - 03/09/23 #30- last request refused- needs appointment- sent for review                  Zepbound - 07/17/23 2ml- off protocol- provider review    Notes to clinic: see above  Requested Prescriptions  Pending Prescriptions Disp Refills   meloxicam  (MOBIC ) 7.5 MG tablet 30 tablet 0    Sig: Take 1 tablet (7.5 mg total) by mouth daily.     Analgesics:  COX2 Inhibitors Failed - 08/20/2023  1:28 PM      Failed - Manual Review: Labs are only required if the patient has taken medication for more than 8 weeks.      Passed - HGB in normal range and within 360 days    Hemoglobin  Date Value Ref Range Status  08/10/2023 13.1 12.0 - 15.0 g/dL Final  95/89/7974 87.0 11.1 - 15.9 g/dL Final   Hemoglobin Other  Date Value Ref Range Status  07/30/2013 0.0 0.0 % Final    Comment:      Interpretation -------------- Normal study.   Reviewed by Romero DOROTHA Rav, MD, PhD, FCAP (Electronic Signature on File)         Passed - Cr in normal range and within 360 days    Creatinine, Ser  Date Value Ref Range Status  08/10/2023 0.79 0.44 - 1.00 mg/dL Final   Creatinine, Urine  Date Value Ref Range Status  05/18/2021 52 mg/dL Final         Passed - HCT in normal range and within 360 days    HCT  Date Value Ref Range Status  08/10/2023 40.9 36.0 - 46.0 % Final   Hematocrit  Date Value Ref Range Status  04/19/2023 38.2 34.0 - 46.6 % Final         Passed - AST in normal range and within 360 days    AST  Date Value Ref Range Status  04/19/2023 19 0 - 40 IU/L Final         Passed - ALT in normal range and within 360 days    ALT  Date Value Ref Range Status  04/19/2023 23 0 - 32 IU/L Final         Passed - eGFR is 30 or above and within 360 days    GFR calc Af Amer  Date Value Ref Range Status   02/20/2019 >60 >60 mL/min Final   GFR, Estimated  Date Value Ref Range Status  08/10/2023 >60 >60 mL/min Final    Comment:    (NOTE) Calculated using the CKD-EPI Creatinine Equation (2021)    eGFR  Date Value Ref Range Status  04/19/2023 100 >59 mL/min/1.73 Final         Passed - Patient is not pregnant      Passed - Valid encounter within last 12 months    Recent Outpatient Visits           1 month ago Class 3 severe obesity due to excess calories with serious comorbidity and body mass index (BMI) of 50.0 to 59.9 in adult   [facility] Latoya Mliss FALCON, FNP   1 month ago Anxiety   [facility] Latoya Mliss FALCON, FNP   5 months ago Left ear pain   [place] [facility]  Latoya Mliss FALCON, FNP   6 months ago Class 3 severe obesity due to excess calories with serious comorbidity and body mass index (BMI) of 50.0 to 59.9 in adult [facility])   [facility] Latoya Mliss FALCON, FNP   4 years ago Blood pressure check   [place] [facility] Celestia Rosaline SQUIBB, NP       Future Appointments             In 3 weeks Latoya Mliss FALCON, FNP [facility], [facility]             ZEPBOUND  2.5 MG/0.5ML Pen [Pharmacy Med Name: tirzepatide  (ZEPBOUND ) 2.5 MG/0.5ML Pen] 2 mL 0    Sig: Inject 2.5 mg into the skin once a week.     Off-Protocol Failed - 08/20/2023  1:28 PM      Failed - Medication not assigned to a protocol, review manually.      Passed - Valid encounter within last 12 months    Recent Outpatient Visits           1 month ago Class 3 severe obesity due to excess calories with serious comorbidity and body mass index (BMI) of 50.0 to 59.9 in adult   [facility] Latoya Mliss FALCON, FNP   1 month ago Anxiety   [facility] Latoya Mliss FALCON, FNP   5 months ago Left ear pain   [facility] Latoya Mliss FALCON, FNP   6 months ago Class 3 severe obesity due to excess calories with serious comorbidity and body mass index (BMI) of 50.0 to 59.9 in adult [facility])   [facility] Latoya Mliss FALCON, FNP   4 years ago Blood pressure check   [place] [facility] Celestia Rosaline SQUIBB, NP       Future Appointments             In 3 weeks Latoya Mliss FALCON, FNP [facility], [facility]            Signed Prescriptions Disp Refills   busPIRone  (BUSPAR ) 5 MG tablet 90 tablet 0    Sig: Take 1 tablet (5 mg total) by mouth 3 (three) times daily.     Psychiatry: Anxiolytics/Hypnotics - Non-controlled Passed - 08/20/2023  1:28 PM      Passed - Valid encounter within last 12 months    Recent Outpatient Visits           1 month ago Class 3 severe obesity due to excess calories with serious comorbidity and body mass index (BMI) of 50.0 to 59.9 in adult   [facility] Latoya Mliss FALCON, FNP   1 month ago Anxiety   [facility] Latoya Mliss FALCON, FNP   5 months ago Left ear pain   [facility] Latoya Mliss FALCON, FNP   6 months ago Class 3 severe obesity due to excess calories with serious comorbidity and body mass index (BMI) of 50.0 to 59.9 in adult [facility])   [name] Latoya Mliss FALCON, FNP   4 years ago Blood pressure check   [place] [facility] Celestia Rosaline SQUIBB, NP       Future Appointments             In 3 weeks Latoya, Mliss FALCON, FNP [facility],  [facility]               Requested Prescriptions  Pending Prescriptions Disp Refills   meloxicam  (MOBIC ) 7.5 MG tablet 30 tablet 0    Sig: Take 1 tablet (7.5 mg total) by mouth daily.     Analgesics:  COX2 Inhibitors Failed - 08/20/2023  1:28 PM      Failed - Manual Review: Labs are only required if the patient has taken  medication for more than 8 weeks.      Passed - HGB in normal range and within 360 days    Hemoglobin  Date Value Ref Range Status  08/10/2023 13.1 12.0 - 15.0 g/dL Final  95/89/7974 87.0 11.1 - 15.9 g/dL Final   Hemoglobin Other  Date Value Ref Range Status  07/30/2013 0.0 0.0 % Final    Comment:      Interpretation -------------- Normal study.   Reviewed by Romero DOROTHA Rav, MD, PhD, FCAP (Electronic Signature on File)         Passed - Cr in normal range and within 360 days    Creatinine, Ser  Date Value Ref Range Status  08/10/2023 0.79 0.44 - 1.00 mg/dL Final   Creatinine, Urine  Date Value Ref Range Status  05/18/2021 52 mg/dL Final         Passed - HCT in normal range and within 360 days    HCT  Date Value Ref Range Status  08/10/2023 40.9 36.0 - 46.0 % Final   Hematocrit  Date Value Ref Range Status  04/19/2023 38.2 34.0 - 46.6 % Final         Passed - AST in normal range and within 360 days    AST  Date Value Ref Range Status  04/19/2023 19 0 - 40 IU/L Final         Passed - ALT in normal range and within 360 days    ALT  Date Value Ref Range Status  04/19/2023 23 0 - 32 IU/L Final         Passed - eGFR is 30 or above and within 360 days    GFR calc Af Amer  Date Value Ref Range Status  02/20/2019 >60 >60 mL/min Final   GFR, Estimated  Date Value Ref Range Status  08/10/2023 >60 >60 mL/min Final    Comment:    (NOTE) Calculated using the CKD-EPI Creatinine Equation (2021)    eGFR  Date Value Ref Range Status  04/19/2023 100 >59 mL/min/1.73 Final         Passed - Patient is not pregnant      Passed - Valid encounter within last 12 months    Recent Outpatient Visits           1 month ago Class 3 severe obesity due to excess calories with serious comorbidity and body mass index (BMI) of 50.0 to 59.9 in adult   [facility] Latoya Mliss FALCON, FNP   1 month ago Anxiety   [facility]  Latoya Mliss F, FNP   5 months ago Left ear pain   [facility] Latoya Mliss FALCON, FNP   6 months ago Class 3 severe obesity due to excess calories with serious comorbidity and body mass index (BMI) of 50.0 to 59.9 in adult [facility])   [facility] Latoya Mliss FALCON, FNP   4 years ago Blood pressure check   [place] [facility]  Celestia Rosaline SQUIBB, NP       Future Appointments             In 3 weeks Latoya Mliss FALCON, FNP [facility]             ZEPBOUND  2.5 MG/0.5ML Pen [Pharmacy Med Name: tirzepatide  (ZEPBOUND ) 2.5 MG/0.5ML Pen] 2 mL 0    Sig: Inject 2.5 mg into the skin once a week.     Off-Protocol Failed - 08/20/2023  1:28 PM      Failed - Medication not assigned to a protocol, review manually.      Passed - Valid encounter within last 12 months    Recent Outpatient Visits           1 month ago Class 3 severe obesity due to excess calories with serious comorbidity and body mass index (BMI) of 50.0 to 59.9 in adult   [facility] Latoya Mliss FALCON, FNP   1 month ago Anxiety   [facility] Latoya Mliss FALCON, FNP   5 months ago Left ear pain   [facility] Latoya Mliss FALCON, FNP   6 months ago Class 3 severe obesity due to excess calories with serious comorbidity and body mass index (BMI) of 50.0 to 59.9 in adult [facility])   [facility] Latoya Mliss FALCON, FNP   4 years ago Blood pressure check   [place] [facility] Celestia Rosaline SQUIBB, NP       Future Appointments             In 3 weeks Latoya Mliss FALCON, FNP [facility], [facility]            Signed Prescriptions Disp Refills   busPIRone  (BUSPAR ) 5 MG tablet 90 tablet 0    Sig: Take 1 tablet (5 mg total) by mouth 3 (three) times daily.     Psychiatry: Anxiolytics/Hypnotics -  Non-controlled Passed - 08/20/2023  1:28 PM      Passed - Valid encounter within last 12 months    Recent Outpatient Visits           1 month ago Class 3 severe obesity due to excess calories with serious comorbidity and body mass index (BMI) of 50.0 to 59.9 in adult   [facility] Latoya Mliss FALCON, FNP   1 month ago Anxiety   [facility] Latoya Mliss FALCON, FNP   5 months ago Left ear pain   [facility] Latoya Mliss FALCON, FNP   6 months ago Class 3 severe obesity due to excess calories with serious comorbidity and body mass index (BMI) of 50.0 to 59.9 in adult [facility])   [facility] Latoya Mliss FALCON, FNP   4 years ago Blood pressure check   [place] [facility] Celestia Rosaline SQUIBB, NP       Future Appointments             In 3 weeks Latoya, Mliss FALCON, FNP [facility], [facility]

## 2023-08-21 ENCOUNTER — Encounter: Payer: Self-pay | Admitting: Family Medicine

## 2023-08-21 ENCOUNTER — Ambulatory Visit (INDEPENDENT_AMBULATORY_CARE_PROVIDER_SITE_OTHER): Admitting: Family Medicine

## 2023-08-21 VITALS — BP 100/62 | HR 97 | Ht 65.0 in | Wt 334.5 lb

## 2023-08-21 DIAGNOSIS — R0683 Snoring: Secondary | ICD-10-CM

## 2023-08-21 DIAGNOSIS — R519 Headache, unspecified: Secondary | ICD-10-CM

## 2023-08-21 DIAGNOSIS — G43709 Chronic migraine without aura, not intractable, without status migrainosus: Secondary | ICD-10-CM | POA: Diagnosis not present

## 2023-08-21 DIAGNOSIS — G40409 Other generalized epilepsy and epileptic syndromes, not intractable, without status epilepticus: Secondary | ICD-10-CM

## 2023-08-21 DIAGNOSIS — R682 Dry mouth, unspecified: Secondary | ICD-10-CM

## 2023-08-21 NOTE — Progress Notes (Signed)
 PATIENT: Latoya Stark DOB: [birth year]  REASON FOR VISIT: follow up HISTORY FROM: patient  Chief Complaint  Patient presents with   Follow-up    Pt in room 1.boyfriend in room. Here seizure follow up. Patient reports no recent seizure.  Boyfriend said pt almost had seizure 2 days ago, pt has bad anxiety. Patient states she has headaches daily, taking gabapentin , has noticed blurry vision which is new.     HISTORY OF PRESENT ILLNESS:  08/21/23 ALL: Latoya Stark returns for follow up for seizures. She was last seen 09/2022 and reported breakthrough seizure the night prior to visit. She reported consistency in taking ASM regimen of lev 1500mg  BID, LTG 200mg  TID and gabapentin  200mg  at bedtime. Labs normal with exception of undetectable gabapentin . Extended EEG ordered but insurance required this to be ordered through PCP. I ordered HST but not completed.   Since, she reports doing well until 08/10/2023 when her children found her having seizure in her sleep. Her boyfriend witnessed second seizure: then 10 minutes later had another seizure. She began rocking, eyes rolled back in head, kept talking. Seizure lasted 5 minutes. After seizure patient complained of tongue and head was hurting. Family took patient to the ER at [facility]. CT normal. Headache treated with migraine cocktail and follow up advised with us . She admits that she has not taken ASMs consistently. She has forgotten to take morning dose of levetiracetam  for the past two mornings. She reports restarting gabapentin  about a week ago.   She reports more stress and concerns of panic attacks. She lost her child's father and another family member over the past year. PCP started sertraline  and buspirone . Not sure anxiety is improving. She has follow up scheduled with PCP. She continues to have regular headaches. Headaches are present all day. She wakes with headaches every morning. She continues to snore, loudly per her significant other. There  is a family history of sleep apnea.   10/09/2022 ALL:  [name] returns for follow up for seizures. She was last seen by me 01/2022. She reported breakthrough seizures in setting of non compliance. EEG was normal. 72 hour EEG ordered but has not been performed. She was advised to take medications consistently as prescribed. We continued lev 1500mg  BID and lamotrigine  200mg  TID. I added topiramate  50mg  BID for headache management. She called the next week stating topiramate  had previously made her headaches worse and requested a different med. Gabapentin  100mg  at bedtime was called in. We increased the dose to 200mg  at bedtime in 04/2022.   Since, she had not had any seizure events until last night. She reports getting into an argument with her family and another argument with her boyfriend. She felt herself getting really hot then blacked out. Her boyfriend reports that he walked in and saw her at the top of the stairs sitting down. She suddenly fell backwards and started shaking all over. Event lasted about 4-5 minutes then she seemed confused and groggy for another 2-3 minutes. She was back to baseline in 7-8 minutes.   She has continued lev, LTG and gabapentin  as prescribed. She denies missed doses of ASM. She rdid not make an appt with neuro diagnostics for extended EEG due to being told she needed clearance from PCP. She has appt with PCP 11/17.   She feels headaches are better. She is tolerating gabapentin . She feels headaches wax and wane but, overall, seem less intense. She does endorse snoring. She has dry mouth and morning headaches.  01/19/2022 ALL: Latoya Stark is a 28 y.o. female here today for follow up for seizures. She was last seen by Dr Vear 03/2021 and reported continued seizure activity. She was 5 months gestation at the time. Levetiracetam  continued at 1500mg  BID and lamotrigine  increased from 100 to 200mg  BID. She called reporting continued seizure activity in 08/2021 and lamotrigine   was increased to 200mg  TID. Her husband called 09/20/2021 reporting seizure lasting 3-4 minutes. Appt schedule with me 09/21/2021 but she did not show for appt. Two additional appts scheduled 09/23/21 and 12/14/21 but were cancelled.   Since, she reports she was doing better from 09/2021 until about 2 weeks ago. She was tolerating lamotrigine  and levetiracetam  and reportedly taking both consistently.   Her mother-in-law called today stating that she had ran out of medication about 10 days ago and had not told anyone. She reports moving to a new home and was taking old medication but stopped when she realized it was old but did not get new meds refilled. She had a seizure yesterday lasting about 3 minutes and had two seizures, today around 1:45pm lasting 4-5 minutes. Husband walked in at the end of the second event and reports her whole body was jerking. It appeared she was asleep. She was not able to communicate. She was back to baseline in 10-15 minutes.   Headaches are worsening. She reports headaches have been worse over the past two weeks. She takes Tylenol  and ibuprofen  with no improvement. She was on topiramate  prior to pregnancy and tolerated it well.   She is currently on depo for birth control. She is not breastfeeding. No plans for another pregnancy.   HISTORY: (copied from Dr Duncan previous note)  Latoya Stark is a 28 y.o. woman with headaches and seizures.   UPDATE 03/15/2021:  She is currently 5 months pregnant.   She plans to breast x 6 months.      She brother reports she has a seizure with shaking every 2-4 weeks.   The shaking lasts one minute and she is unresponsive afterwards for several minutes.    She takes Keppra  1500 mg twice a day and lamotigine 100 mg po bid.    She had een on topiramate  and it was d/c early in pregnancy (as lamotrigine  added).         She also gets a lot of headaches.   Topiramate  had not helped.   With a headache, she gets some nausea but has a lot of  photophobia (no phonophobia).      She was having left sided numbness at last visit.   MRI 11/2020 was normal.     She gets intermittent left leg numbness since 4-5 days ago.   She also had left leg numbness when pregnant in 2020/2021    At that time (02/10/2019) she had normal MRI of the cervical, thoracic and lumbar spine).  MRi brain 02/09/2019 was also normal.     Seizure history: She has had seizures since age 28.    In 05/2018,  she was sitting in the car talking to her boyfriend and then seized (2-3 minutes GTC) and was taken to the ED,    She reports being told that she was slurring her words and not making sense for a lttle bit before the seizure.  She also complained of a headache.    She went to the ED and was placed on Keppra .   She had another seizure 6/8/2022She was taken to [facility] ED.  She had CT head and cervical spine and had xrays of right shoulder and lumbar spine.     She had been on Keppra  1500 mg bid.     Her level was good at 23.7 (10-40 is range)   She is having a lot of headaches.   They are located left frontal or bitemporal.   Quality is pounding.     She is having 6 sever HA a month.   She notes lights sometimes trigger them.   When present, she has photophobia (not much phonophobia), nausea and rare vomiting.  Sometimes she feels lightheaded.  Moving worsens the pain.       I personally reviewed the CT scan of the head and cervical spine from 06/16/2020.  Both are normal.  There are no acute findings.   Laboratory tests from 06/16/2020 were also reviewed.  The Tox  screen showed benzodiazepines (received some before the test due to seizure).  CMP, CBC, hCG were negative or noncontributory   Seizure History She had her first seizure at age 80.  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    She has been having one every 2-3 months, even while on treatment.  She stopped Dilantin  in 2020 (10 days or so before the seizure  prompting initial consult with me) due to pregnancy.    I switched her to Keppra , initially 750 mg twice daily and she was titrated up to 1500 mg twice daily due to breakthrough activity.   She had additional breakthrough seizures in 2022 and on 06/22/2020 Topamax  100 mg twice daily was added.   HA history She has had migraine headaches since her teen years and the frequency increased in 2020 to become daily.  Generally, headaches are located bilaterally, forehead > temporal region.    She gets nausea, severe photophobia and minimal phonophobia.    Headaches usually improve if she sleeps them off.    Heat triggers the headache.  Light sometimes triggers the headaches Tylenol  sometimes stops the headaches.   Topamax  had helped her headaches with benefit in the past.  Keppra  initially helped the headaches.   REVIEW OF SYSTEMS: Out of a complete 14 system review of symptoms, the patient complains only of the following symptoms, headaches, seizures and all other reviewed systems are negative.   ALLERGIES: Allergies  Allergen Reactions   Hydrocodone  Hives   Milk-Related Compounds Dermatitis    Breaks face out *can have 2% milk*   Mushroom Extract Complex (Obsolete) Anaphylaxis   Penicillins Anaphylaxis    Has patient had a PCN reaction causing immediate rash, facial/tongue/throat swelling, SOB or lightheadedness with hypotension: Yes Has patient had a PCN reaction causing severe rash involving mucus membranes or skin necrosis: No Has patient had a PCN reaction that required hospitalization No Has patient had a PCN reaction occurring within the last 10 years: Yes If all of the above answers are NO, then may proceed with Cephalosporin use.    Cheese Other (See Comments)    Breaks face out   Latex Itching and Swelling   Sulfa Antibiotics Swelling    HOME MEDICATIONS: Outpatient Medications Prior to Visit  Medication Sig Dispense Refill   albuterol  (VENTOLIN  HFA) 108 (90 Base) MCG/ACT  inhaler Inhale 1-2 puffs into the lungs every 6 (six) hours as needed for wheezing or shortness of breath. 18 g 11   busPIRone  (BUSPAR ) 5 MG tablet Take 1 tablet (5 mg total) by mouth 3 (  three) times daily. 90 tablet 0   cetirizine  (ZYRTEC ) 10 MG tablet Take 1 tablet (10 mg total) by mouth daily. 30 tablet 0   clindamycin  (CLEOCIN ) 300 MG capsule Take 300 mg by mouth every 6 (six) hours.     cyclobenzaprine  (FLEXERIL ) 10 MG tablet Take 0.5-1 tablets (5-10 mg total) by mouth 3 (three) times daily as needed. 30 tablet 0   EPINEPHrine  (EPIPEN  2-PAK) 0.3 mg/0.3 mL IJ SOAJ injection Inject 0.3 mg into the muscle once as needed (for severe allergic reaction). CAll 911 immediately if you have to use this medicine 2 each 1   gabapentin  (NEURONTIN ) 100 MG capsule Take 2 capsules (200 mg total) by mouth at bedtime. 180 capsule 0   hydrOXYzine  (VISTARIL ) 25 MG capsule TAKE 1 CAPSULE (25 MG TOTAL) BY MOUTH AT BEDTIME AS NEEDED FOR ANXIETY. 90 capsule 0   ibuprofen  (IBU) 800 MG tablet Take 1 tablet (800 mg total) by mouth every 8 (eight) hours as needed for moderate pain (pain score 4-6). 90 tablet 1   lamoTRIgine  (LAMICTAL ) 200 MG tablet Take 1 tablet (200 mg total) by mouth 3 (three) times daily. 90 tablet 0   levETIRAcetam  (KEPPRA ) 750 MG tablet Take 2 tablets (1,500 mg total) by mouth 2 (two) times daily. 120 tablet 0   meloxicam  (MOBIC ) 7.5 MG tablet Take 1 tablet (7.5 mg total) by mouth daily. 30 tablet 0   methylPREDNISolone  (MEDROL  DOSEPAK) 4 MG TBPK tablet Take as directed 21 tablet 0   oxyCODONE  (ROXICODONE ) 5 MG immediate release tablet Take 1 tablet (5 mg total) by mouth every 6 (six) hours as needed for severe pain (pain score 7-10) or breakthrough pain. 5 tablet 0   sertraline  (ZOLOFT ) 100 MG tablet Take 1 tablet (100 mg total) by mouth daily. 30 tablet 0   SYMBICORT  80-4.5 MCG/ACT inhaler Inhale 2 puffs into the lungs 2 (two) times daily as needed (shortness of breath). 1 each 10   tirzepatide   (ZEPBOUND ) 2.5 MG/0.5ML Pen Inject 2.5 mg into the skin once a week. 2 mL 0   No facility-administered medications prior to visit.    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   started at age 29   Headache    Obesity    Pneumonia    Postpartum depression, postpartum condition 08/11/2021   Seizures (HCC)    when I get too hot   Trichomonas infection    Umbilical hernia 04/2022   UTI (lower urinary tract infection)    Vulvar abscess 08/11/2021    PAST SURGICAL HISTORY: Past Surgical History:  Procedure Laterality Date   COLONOSCOPY N/A 05/22/2023   Procedure: COLONOSCOPY;  Surgeon: Therisa Bi, MD;  Location: [facility] ENDOSCOPY;  Service: Gastroenterology;  Laterality: N/A;   DILATION AND EVACUATION N/A 08/09/2013   Procedure: DILATATION AND EVACUATION;  Surgeon: Aida DELENA Na, MD;  Location: [facility] ORS;  Service: Gynecology;  Laterality: N/A;   DILATION AND EVACUATION  2017   ESOPHAGOGASTRODUODENOSCOPY (EGD) WITH PROPOFOL  N/A 11/10/2022   Procedure: ESOPHAGOGASTRODUODENOSCOPY (EGD) WITH PROPOFOL ;  Surgeon: Therisa Bi, MD;  Location: [facility] ENDOSCOPY;  Service: Gastroenterology;  Laterality: N/A;   EUSTACHIAN TUBE DILATION     EXCISION MASS NECK N/A 11/16/2022   Procedure: EXCISION MASS NECK;  Surgeon: Jordis Laneta FALCON, MD;  Location: [facility] ORS;  Service: General;  Laterality: N/A;  JP drain placed   LACERATION REPAIR Left 2007   5th toe   ORTHOPEDIC  SURGERY Left 2010   hand fracture; screws in place   TONSILLECTOMY AND ADENOIDECTOMY  2001   TYMPANOSTOMY TUBE PLACEMENT Bilateral 2002   UMBILICAL HERNIA REPAIR N/A 05/12/2022   Procedure: LAPAROSCOPIC ASSISTED SUPRA UMBILICAL HERNIA REPAIR;  Surgeon: Gladis Cough, MD;  Location: [facility] ORS;  Service: General;  Laterality: N/A;    FAMILY HISTORY: Family History  Problem Relation Age of Onset   Depression Mother    Heart disease Mother    Cancer Mother        Bone cancer   Seizures Mother    Stroke Mother    Sickle cell anemia Mother    Thyroid  disease Mother    Heart disease Father    Hypertension Father    Seizures Maternal Aunt    Diabetes Maternal Grandmother    Colon cancer Maternal Grandmother    Hypertension Maternal Grandmother    Clotting disorder Maternal Grandmother    Breast cancer Maternal Grandmother    Stroke Maternal Grandmother    Diabetes Paternal Grandmother     SOCIAL HISTORY: Social History   Socioeconomic History   Marital status: Significant Other    Spouse name: Surveyor, mining   Number of children: 2   Years of education: Not on file   Highest education level: Associate degree: academic program  Occupational History   Not on file  Tobacco Use   Smoking status: Never    Passive exposure: Never   Smokeless tobacco: Never  Vaping Use   Vaping status: Never Used  Substance and Sexual Activity   Alcohol use: Yes    Comment: occassional   Drug use: No   Sexual activity: Not Currently    Partners: Male  Other Topics Concern   Not on file  Social History Narrative   Caffeine : tea sometimes   Lives with fiance   Right handed    Social Drivers of Health   Financial Resource Strain: Low Risk  (11/24/2022)   Overall Financial Resource Strain (CARDIA)    Difficulty of Paying Living Expenses: Not hard at all  Food Insecurity: No Food Insecurity (11/24/2022)   Hunger Vital Sign    Worried About Radiation protection practitioner of Food in the Last Year: Never true     Ran Out of Food in the Last Year: Never true  Transportation Needs: No Transportation Needs (11/24/2022)   PRAPARE - Administrator, Civil Service (Medical): No    Lack of Transportation (Non-Medical): No  Physical Activity: Insufficiently Active (11/24/2022)   Exercise Vital Sign    Days of Exercise per Week: 5 days    Minutes of Exercise per Session: 20 min  Stress: No Stress Concern Present (11/24/2022)   [employer] of Occupational Health - Occupational Stress Questionnaire    Feeling of Stress : Not at all  Social Connections: Unknown (11/24/2022)   Social Connection and Isolation Panel    Frequency of Communication with Friends and Family: Patient declined  Frequency of Social Gatherings with Friends and Family: Patient declined    Attends Religious Services: Patient declined    Active Member of Clubs or Organizations: No    Attends Engineer, structural: Not on file    Marital Status: Never married  Intimate Partner Violence: Not At Risk (08/14/2018)   Humiliation, Afraid, Rape, and Kick questionnaire    Fear of Current or Ex-Partner: No    Emotionally Abused: No    Physically Abused: No    Sexually Abused: No      PHYSICAL EXAM  Vitals:   08/21/23 0958  BP: 100/62  Pulse: 97  SpO2: 98%  Weight: (!) 334 lb 8 oz (151.7 kg)  Height: 5' 5 (1.651 m)      Body mass index is 55.66 kg/m.  Generalized: Well developed, in no acute distress  Cardiology: normal rate and rhythm, no murmur noted Respiratory: clear to auscultation bilaterally  Neurological examination  Mentation: Alert oriented to time, place, history taking. Follows all commands speech and language fluent Cranial nerve II-XII: Pupils were equal round reactive to light. Extraocular movements were full, visual field were full  Motor: The motor testing reveals 5 over 5 strength of all 4 extremities. Good symmetric motor tone is noted throughout.  Gait and station: Gait is normal.      DIAGNOSTIC DATA (LABS, IMAGING, TESTING) - I reviewed patient records, labs, notes, testing and imaging myself where available.      No data to display           Lab Results  Component Value Date   WBC 8.2 08/10/2023   HGB 13.1 08/10/2023   HCT 40.9 08/10/2023   MCV 88.5 08/10/2023   PLT 371 08/10/2023      Component Value Date/Time   NA 140 08/10/2023 1634   NA 137 04/19/2023 1047   K 3.8 08/10/2023 1634   CL 103 08/10/2023 1634   CO2 25 08/10/2023 1634   GLUCOSE 73 08/10/2023 1634   BUN 14 08/10/2023 1634   BUN 9 04/19/2023 1047   CREATININE 0.79 08/10/2023 1634   CALCIUM  9.2 08/10/2023 1634   PROT 7.4 04/19/2023 1047   ALBUMIN 4.6 04/19/2023 1047   AST 19 04/19/2023 1047   ALT 23 04/19/2023 1047   ALKPHOS 124 (H) 04/19/2023 1047   BILITOT 0.3 04/19/2023 1047   GFRNONAA >60 08/10/2023 1634   GFRAA >60 02/20/2019 2329   Lab Results  Component Value Date   CHOL 163 04/19/2023   HDL 49 04/19/2023   LDLCALC 100 (H) 04/19/2023   TRIG 70 04/19/2023   CHOLHDL 3.3 04/19/2023   Lab Results  Component Value Date   HGBA1C 5.8 (H) 04/19/2023   Lab Results  Component Value Date   VITAMINB12 269 04/19/2023   Lab Results  Component Value Date   TSH 1.150 04/19/2023     ASSESSMENT AND PLAN 28 y.o. 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 here with     ICD-10-CM   1. 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 Chronic migraine without aura without status migrainosus, not intractable  G43.709 Home sleep test    3. Snoring  R06.83 Home sleep test    4. Dry mouth  R68.2 Home sleep test    5. Morning headache  R51.9 Home sleep test      Dayna reports having seizure event 08/10/2023 during sleep. Previously reported events during emotional stress. Previous event was 09/2022. She admits to medication non adherence. We have discussed importance of consistency with ASM dosing. She will continue levetiracetam  1500mg  twice daily and lamotrigine  200mg  three times daily. We will continue gabapentin  200mg  daily for migraine prevention. I have ordered routine EEG. Will plan to order extended EEG pending results. May consider EMU if needed. I will update labs. I will place orders for HST to rule out sleep apnea. Driving restrictions advised for 6 months. I have discussed care plan with her significant other and her father. She was encouraged to work on healthy lifestyle habits. Sleep hygiene encouraged. She will follow up pending workup.    Orders Placed This Encounter  Procedures   Lamotrigine  level   Levetiracetam  level   Gabapentin  level   CBC with Differential/Platelets   CMP   EEG adult    Standing Status:   Future    Expiration Date:   08/20/2024    Where should this test be performed?:   [facility]    Reason for exam:   Seizure   Home sleep test    Standing Status:   Future    Expiration Date:   08/20/2024    Where should this test be performed::   [facility]     No orders of the defined types were placed in this encounter.    I personally spent a total of 40 minutes in the care of the patient today including preparing to see the patient, getting/reviewing separately obtained  history, performing a medically appropriate exam/evaluation, counseling and educating, placing orders, referring and communicating with other health care professionals, documenting clinical information in the EHR, independently interpreting results, communicating results, and coordinating care.      Greig Forbes, FNP-C 08/21/2023, 4:17 PM [facility] [address], [address] [place], [place] [address] [phone]

## 2023-08-21 NOTE — Patient Instructions (Signed)
 Below is our plan:  We will continue levetiracetam  1500mg  twice daily, lamotrigine  200mg  three times daily and gabapentin  200mg  daily. Please make sure you are taking these medications consistently every day. We will check labs, today. I am ordering an EEG to be done at our office. If normal we will order an extended EEG that will be done at home. If normal we can consider admitting you to the hospital for seizure evaluation. I will order a home sleep study as well. Please make sure to schedule these test. Let me know if you do not hear back to schedule these tests.   Please make sure you are consistent with timing of seizure medication. I recommend annual visit with primary care provider (PCP) for complete physical and routine blood work. I recommend daily intake of vitamin D  (400-800iu) and calcium  (800-1000mg ) for bone health. Discuss Dexa screening with PCP.   According to [place] law, you can not drive unless you are seizure / syncope free for at least 6 months and under physician's care.  Please maintain precautions. Do not participate in activities where a loss of awareness could harm you or someone else. No swimming alone, no tub bathing, no hot tubs, no driving, no operating motorized vehicles (cars, ATVs, motocycles, etc), lawnmowers, power tools or firearms. No standing at heights, such as rooftops, ladders or stairs. Avoid hot objects such as stoves, heaters, open fires. Wear a helmet when riding a bicycle, scooter, skateboard, etc. and avoid areas of traffic. Set your water  heater to 120 degrees or less.  SUDEP is the sudden, unexpected death of someone with epilepsy, who was otherwise healthy. In SUDEP cases, no other cause of death is found when an autopsy is done. Each year, more than 1 in 1,000 people with epilepsy die from SUDEP. This is the leading cause of death in people with uncontrolled seizures. Until further answers are available, the best way to prevent SUDEP is to lower your risk by  controlling seizures. Research has found that people with all types of epilepsy that experience convulsive seizures can be at risk.   Please make sure you are staying well hydrated. I recommend 50-60 ounces daily. Well balanced diet and regular exercise encouraged. Consistent sleep schedule with 6-8 hours recommended.   Please continue follow up with care team as directed.   Follow up with me in 4-6 months   You may receive a survey regarding today's visit. I encourage you to leave honest feed back as I do use this information to improve patient care. Thank you for seeing me today!

## 2023-08-22 LAB — LAMOTRIGINE LEVEL: Lamotrigine Lvl: 1.8 ug/mL — ABNORMAL LOW (ref 2.0–20.0)

## 2023-08-22 LAB — COMPREHENSIVE METABOLIC PANEL WITH GFR
ALT: 17 IU/L (ref 0–32)
AST: 15 IU/L (ref 0–40)
Albumin: 4.3 g/dL (ref 4.0–5.0)
Alkaline Phosphatase: 101 IU/L (ref 44–121)
BUN/Creatinine Ratio: 13 (ref 9–23)
BUN: 9 mg/dL (ref 6–20)
Bilirubin Total: 0.2 mg/dL (ref 0.0–1.2)
CO2: 22 mmol/L (ref 20–29)
Calcium: 9.5 mg/dL (ref 8.7–10.2)
Chloride: 103 mmol/L (ref 96–106)
Creatinine, Ser: 0.7 mg/dL (ref 0.57–1.00)
Globulin, Total: 2.4 g/dL (ref 1.5–4.5)
Glucose: 97 mg/dL (ref 70–99)
Potassium: 4.2 mmol/L (ref 3.5–5.2)
Sodium: 140 mmol/L (ref 134–144)
Total Protein: 6.7 g/dL (ref 6.0–8.5)
eGFR: 120 mL/min/1.73 (ref >59)

## 2023-08-22 LAB — CBC WITH DIFFERENTIAL/PLATELET
Basophils Absolute: 0 x10E3/uL (ref 0.0–0.2)
Basos: 0 %
EOS (ABSOLUTE): 0.2 x10E3/uL (ref 0.0–0.4)
Eos: 3 %
Hematocrit: 39.4 % (ref 34.0–46.6)
Hemoglobin: 12.4 g/dL (ref 11.1–15.9)
Immature Grans (Abs): 0 x10E3/uL (ref 0.0–0.1)
Immature Granulocytes: 0 %
Lymphocytes Absolute: 2.1 x10E3/uL (ref 0.7–3.1)
Lymphs: 35 %
MCH: 27.3 pg (ref 26.6–33.0)
MCHC: 31.5 g/dL (ref 31.5–35.7)
MCV: 87 fL (ref 79–97)
Monocytes Absolute: 0.4 x10E3/uL (ref 0.1–0.9)
Monocytes: 7 %
Neutrophils Absolute: 3.3 x10E3/uL (ref 1.4–7.0)
Neutrophils: 55 %
Platelets: 379 x10E3/uL (ref 150–450)
RBC: 4.54 x10E6/uL (ref 3.77–5.28)
RDW: 13 % (ref 11.7–15.4)
WBC: 6 x10E3/uL (ref 3.4–10.8)

## 2023-08-22 LAB — GABAPENTIN LEVEL: Gabapentin Lvl: <1 ug/mL — ABNORMAL LOW (ref 4.0–16.0)

## 2023-08-22 LAB — LEVETIRACETAM LEVEL: Levetiracetam Lvl: <2 ug/mL — ABNORMAL LOW (ref 10.0–40.0)

## 2023-08-23 ENCOUNTER — Other Ambulatory Visit: Payer: Self-pay

## 2023-08-23 ENCOUNTER — Encounter: Payer: Self-pay | Admitting: Nurse Practitioner

## 2023-08-23 ENCOUNTER — Ambulatory Visit (INDEPENDENT_AMBULATORY_CARE_PROVIDER_SITE_OTHER): Admitting: Nurse Practitioner

## 2023-08-23 VITALS — BP 116/70 | HR 102 | Temp 98.2°F | Resp 18 | Ht 65.0 in | Wt 333.1 lb

## 2023-08-23 DIAGNOSIS — M5441 Lumbago with sciatica, right side: Secondary | ICD-10-CM | POA: Diagnosis not present

## 2023-08-23 DIAGNOSIS — F419 Anxiety disorder, unspecified: Secondary | ICD-10-CM | POA: Diagnosis not present

## 2023-08-23 DIAGNOSIS — Z889 Allergy status to unspecified drugs, medicaments and biological substances status: Secondary | ICD-10-CM

## 2023-08-23 DIAGNOSIS — M5442 Lumbago with sciatica, left side: Secondary | ICD-10-CM

## 2023-08-23 DIAGNOSIS — F330 Major depressive disorder, recurrent, mild: Secondary | ICD-10-CM | POA: Diagnosis not present

## 2023-08-23 DIAGNOSIS — Z6841 Body Mass Index (BMI) 40.0 and over, adult: Secondary | ICD-10-CM

## 2023-08-23 DIAGNOSIS — J4530 Mild persistent asthma, uncomplicated: Secondary | ICD-10-CM

## 2023-08-23 DIAGNOSIS — E66813 Obesity, class 3: Secondary | ICD-10-CM

## 2023-08-23 DIAGNOSIS — G8929 Other chronic pain: Secondary | ICD-10-CM | POA: Insufficient documentation

## 2023-08-23 MED ORDER — TIRZEPATIDE-WEIGHT MANAGEMENT 5 MG/0.5ML ~~LOC~~ SOAJ
5.0000 mg | SUBCUTANEOUS | 0 refills | Status: DC
  Filled 2023-08-23: qty 2, 28d supply, fill #0

## 2023-08-23 MED ORDER — EPINEPHRINE 0.3 MG/0.3ML IJ SOAJ
0.3000 mg | Freq: Once | INTRAMUSCULAR | 1 refills | Status: AC | PRN
  Filled 2023-08-23: qty 2, 2d supply, fill #0
  Filled 2023-12-19 – 2024-02-06 (×3): qty 2, 2d supply, fill #1

## 2023-08-23 MED ORDER — CYCLOBENZAPRINE HCL 10 MG PO TABS
5.0000 mg | ORAL_TABLET | Freq: Three times a day (TID) | ORAL | 0 refills | Status: DC | PRN
  Filled 2023-08-23: qty 30, 10d supply, fill #0

## 2023-08-23 MED ORDER — SERTRALINE HCL 100 MG PO TABS
100.0000 mg | ORAL_TABLET | Freq: Every day | ORAL | 1 refills | Status: AC
  Filled 2023-08-23: qty 90, 90d supply, fill #0
  Filled 2024-01-15 – 2024-02-06 (×2): qty 90, 90d supply, fill #1

## 2023-08-23 MED ORDER — ALBUTEROL SULFATE HFA 108 (90 BASE) MCG/ACT IN AERS
1.0000 | INHALATION_SPRAY | Freq: Four times a day (QID) | RESPIRATORY_TRACT | 11 refills | Status: AC | PRN
  Filled 2023-08-23: qty 18, 25d supply, fill #0
  Filled 2024-01-15: qty 18, 25d supply, fill #1

## 2023-08-23 MED ORDER — SYMBICORT 80-4.5 MCG/ACT IN AERO
2.0000 | INHALATION_SPRAY | Freq: Two times a day (BID) | RESPIRATORY_TRACT | 10 refills | Status: AC | PRN
  Filled 2023-08-23: qty 10.2, 30d supply, fill #0
  Filled 2024-01-15 – 2024-02-06 (×2): qty 10.2, 30d supply, fill #1

## 2023-08-23 NOTE — Progress Notes (Signed)
 BP 116/70   Pulse (!) 102   Temp 98.2 F (36.8 C)   Resp 18   Ht 5' 5 (1.651 m)   Wt (!) 333 lb 1.6 oz (151.1 kg)   SpO2 97%   BMI 55.43 kg/m    Subjective:    Patient ID: Latoya Stark, female    DOB: Aug 18, 1995, 28 y.o.   MRN: [id number]  HPI: Latoya Stark is a 28 y.o. female  Chief Complaint  Patient presents with   Medical Management of Chronic Issues   Obesity   Depression    Go over gene sight testing   Medication Refill    Discussed the use of AI scribe software for clinical note transcription with the patient, who gave verbal consent to proceed.  History of Present Illness Syrianna L Latoya Stark is a 28 year old female who presents for a four-week follow-up for depression.  Depressive and anxiety symptoms - Significant improvement in depressive symptoms with sertraline  (Zoloft ) 100 mg daily over the past four weeks - Persistent elevated anxiety despite current regimen - Buspirone  5 mg three times daily as needed for anxiety - Hydroxyzine  25 mg at bedtime as needed for anxiety and sleep  Obesity and weight management - Current weight 333 pounds, BMI 55.43 - Initiated tirzepatide  (Zepbound ) approximately one month ago - Lifestyle modifications include eliminating junk food, reducing bread and potatoes, and increasing water  intake  Chronic low back pain - Chronic low back pain managed with cyclobenzaprine  (Flexeril )  Migraine headaches - History of migraines exacerbated by light exposure rather than noise - Prescribed gabapentin , but medication adherence affected by pharmacy stock issues - Oxycodone  previously prescribed for severe migraines, used sparingly due to discomfort with narcotics  Allergic reactions - Requires refill of epinephrine  autoinjector (EpiPen ) for allergies         08/23/2023    8:51 AM 07/17/2023    8:55 AM 07/06/2023    7:35 AM  Depression screen PHQ 2/9  Decreased Interest 0 0 0  Down, Depressed, Hopeless 0 3 0  PHQ - 2  Score 0 3 0  Altered sleeping 3 3 0  Tired, decreased energy 0 2 0  Change in appetite 2 1 0  Feeling bad or failure about yourself  2 1 0  Trouble concentrating 0 0 0  Moving slowly or fidgety/restless 0 2 0  Suicidal thoughts 0 0 0  PHQ-9 Score 7 12 0  Difficult doing work/chores Not difficult at all  Not difficult at all       08/23/2023    8:54 AM 07/17/2023    8:59 AM 07/06/2023    7:35 AM 02/15/2023    7:40 AM  GAD 7 : Generalized Anxiety Score  Nervous, Anxious, on Edge 2 1 2 3   Control/stop worrying 3 3 2 3   Worry too much - different things 3 2 2 1   Trouble relaxing 1 3 2 2   Restless 0 3 2 0  Easily annoyed or irritable 3 0 2 1  Afraid - awful might happen 0 0 2 0  Total GAD 7 Score 12 12 14 10   Anxiety Difficulty Somewhat difficult Not difficult at all Very difficult      Relevant past medical, surgical, family and social history reviewed and updated as indicated. Interim medical history since our last visit reviewed. Allergies and medications reviewed and updated.  Review of Systems  Ten systems reviewed and is negative except as mentioned in HPI  Objective:     BP 116/70   Pulse (!) 102   Temp 98.2 F (36.8 C)   Resp 18   Ht 5' 5 (1.651 m)   Wt (!) 333 lb 1.6 oz (151.1 kg)   SpO2 97%   BMI 55.43 kg/m    Wt Readings from Last 3 Encounters:  08/23/23 (!) 333 lb 1.6 oz (151.1 kg)  08/21/23 (!) 334 lb 8 oz (151.7 kg)  07/17/23 (!) 330 lb 1.6 oz (149.7 kg)    Physical Exam Physical Exam MEASUREMENTS: Weight- 333, BMI- 55.43. GENERAL: Alert, cooperative, well developed, no acute distress HEENT: Normocephalic, normal oropharynx, moist mucous membranes CHEST: Clear to auscultation bilaterally, No wheezes, rhonchi, or crackles CARDIOVASCULAR: Normal heart rate and rhythm, S1 and S2 normal without murmurs ABDOMEN: Soft, non-tender, non-distended, without organomegaly, Normal bowel sounds EXTREMITIES: No cyanosis or edema NEUROLOGICAL: Cranial nerves  grossly intact, Moves all extremities without gross motor or sensory deficit   Results for orders placed or performed in visit on 08/21/23  Lamotrigine  level   Collection Time: 08/21/23 10:46 AM  Result Value Ref Range   Lamotrigine  Lvl 1.8 (L) 2.0 - 20.0 ug/mL  Levetiracetam  level   Collection Time: 08/21/23 10:46 AM  Result Value Ref Range   Levetiracetam  Lvl <2.0 (L) 10.0 - 40.0 ug/mL  Gabapentin  level   Collection Time: 08/21/23 10:46 AM  Result Value Ref Range   Gabapentin  Lvl <1.0 (L) 4.0 - 16.0 ug/mL  CBC with Differential/Platelets   Collection Time: 08/21/23 10:46 AM  Result Value Ref Range   WBC 6.0 3.4 - 10.8 x10E3/uL   RBC 4.54 3.77 - 5.28 x10E6/uL   Hemoglobin 12.4 11.1 - 15.9 g/dL   Hematocrit 60.5 65.9 - 46.6 %   MCV 87 79 - 97 fL   MCH 27.3 26.6 - 33.0 pg   MCHC 31.5 31.5 - 35.7 g/dL   RDW 86.9 88.2 - 84.5 %   Platelets 379 150 - 450 x10E3/uL   Neutrophils 55 Not Estab. %   Lymphs 35 Not Estab. %   Monocytes 7 Not Estab. %   Eos 3 Not Estab. %   Basos 0 Not Estab. %   Neutrophils Absolute 3.3 1.4 - 7.0 x10E3/uL   Lymphocytes Absolute 2.1 0.7 - 3.1 x10E3/uL   Monocytes Absolute 0.4 0.1 - 0.9 x10E3/uL   EOS (ABSOLUTE) 0.2 0.0 - 0.4 x10E3/uL   Basophils Absolute 0.0 0.0 - 0.2 x10E3/uL   Immature Granulocytes 0 Not Estab. %   Immature Grans (Abs) 0.0 0.0 - 0.1 x10E3/uL  CMP   Collection Time: 08/21/23 10:46 AM  Result Value Ref Range   Glucose 97 70 - 99 mg/dL   BUN 9 6 - 20 mg/dL   Creatinine, Ser 9.29 0.57 - 1.00 mg/dL   eGFR 879 >40 fO/fpw/8.26   BUN/Creatinine Ratio 13 9 - 23   Sodium 140 134 - 144 mmol/L   Potassium 4.2 3.5 - 5.2 mmol/L   Chloride 103 96 - 106 mmol/L   CO2 22 20 - 29 mmol/L   Calcium  9.5 8.7 - 10.2 mg/dL   Total Protein 6.7 6.0 - 8.5 g/dL   Albumin 4.3 4.0 - 5.0 g/dL   Globulin, Total 2.4 1.5 - 4.5 g/dL   Bilirubin Total 0.2 0.0 - 1.2 mg/dL   Alkaline Phosphatase 101 44 - 121 IU/L   AST 15 0 - 40 IU/L   ALT 17 0 - 32 IU/L           Assessment &  Plan:   Problem List Items Addressed This Visit       Respiratory   Mild persistent asthma without complication   Relevant Medications   albuterol  (VENTOLIN  HFA) 108 (90 Base) MCG/ACT inhaler   SYMBICORT  80-4.5 MCG/ACT inhaler     Nervous and Auditory   Chronic midline low back pain with bilateral sciatica   Relevant Medications   cyclobenzaprine  (FLEXERIL ) 10 MG tablet   sertraline  (ZOLOFT ) 100 MG tablet   tirzepatide  (ZEPBOUND ) 5 MG/0.5ML Pen     Other   Mild episode of recurrent major depressive disorder (HCC) - Primary   Relevant Medications   sertraline  (ZOLOFT ) 100 MG tablet   Anxiety   Relevant Medications   sertraline  (ZOLOFT ) 100 MG tablet   Class 3 severe obesity due to excess calories with serious comorbidity and body mass index (BMI) of 50.0 to 59.9 in adult   Relevant Medications   tirzepatide  (ZEPBOUND ) 5 MG/0.5ML Pen   Other Visit Diagnoses       H/O multiple allergies       Relevant Medications   EPINEPHrine  (EPIPEN  2-PAK) 0.3 mg/0.3 mL IJ SOAJ injection        Assessment and Plan Assessment & Plan Depression and anxiety Depression has improved with the current treatment regimen. Anxiety remains elevated, likely exacerbated by current life stressors. Zoloft  and Buspar  are compatible with her genetic profile according to gene site testing, but may not necessarily be effective for her symptoms. - Continue Zoloft  100 mg daily - Continue Buspirone  5 mg three times daily as needed - Continue Hydroxyzine  25 mg at bedtime as needed for anxiety and sleep - Provide gene site testing results for future reference  Obesity Obesity with a BMI of 55.43. She is motivated to lose weight and has made lifestyle changes including cutting out junk food, increasing water  intake, and exercising. Currently on Zepbound , but has not seen significant weight loss. - Increase Zepbound  to 5 mg - Continue lifestyle modifications including diet and  exercise - Encourage continuation of lifestyle modifications, including dietary management and regular exercise. -continue to increase physical activity, getting at least 150 min of physical activity a week.  Work on including [employer], broadcasting/film/video 2 days a week.  - continue eating at a calorie deficit 1600-1700 cal a day, eating a well balanced diet with whole foods, avoiding processed foods.   Patient is motivated to continue working on lifestyle modification.    Chronic low back pain Chronic low back pain managed with Flexeril . - Refill Flexeril  prescription  Migraine Migraines are severe and triggered by lights. Previous treatment with Topamax  was ineffective and worsened headaches. Current management includes gabapentin , but adherence has been inconsistent due to pharmacy issues. Neurologist is involved in management and plans to reassess if headaches persist after consistent gabapentin  use. - Ensure gabapentin  is filled and taken consistently - Coordinate with neurologist for further evaluation if headaches persist  Seizure disorder Seizure disorder managed with gabapentin . Issues with medication availability at [facility] pharmacy have led to inconsistent use. - Ensure seizure medications are filled at [facility] pharmacy  Allergic disorder requiring epinephrine  autoinjector Allergic disorder requiring EpiPen  for management. - Refill EpiPen  prescription        Follow up plan: Return in about 3 months (around 11/23/2023) for follow up.

## 2023-08-24 ENCOUNTER — Ambulatory Visit: Payer: Self-pay | Admitting: *Deleted

## 2023-08-24 ENCOUNTER — Other Ambulatory Visit: Payer: Self-pay | Admitting: Podiatry

## 2023-08-24 DIAGNOSIS — M19072 Primary osteoarthritis, left ankle and foot: Secondary | ICD-10-CM

## 2023-08-24 NOTE — Telephone Encounter (Signed)
 FYI Only or Action Required?: Action required by provider: clinical question for provider and update on patient condition.  Patient was last seen in primary care on 08/23/2023 by Gareth Mliss FALCON, FNP.  Called Nurse Triage reporting Altered Mental Status (Loopy- hard to get thoughts out ) and food reaction.  Symptoms began today.  Interventions attempted: Prescription medications: Patient reports she ha staken her seizure medications .  Symptoms are: gradually improving.  Triage Disposition: Call PCP When Office is Open  Patient/caregiver understands and will follow disposition?: Patient has called specialist- alerting PCP as requested   Reason for Disposition  Nursing judgment or information in reference  Answer Assessment - Initial Assessment Questions 1. LEVEL OF CONSCIOUSNESS: How are they (the patient) acting right now? (e.g., alert-oriented, confused, lethargic, stuporous, comatose)     Loopy- hard to get thoughts together 2. ONSET: When did the confusion start?  (e.g., minutes, hours, days)     Patient ate dark chocolate in cookie this morning- patient state she stated feeling loopy  3. PATTERN: Does this come and go, or has it been constant since it started?  Is it present now?     Constant- present now 4. ALCOHOL or DRUGS: Have they been drinking alcohol or taking any drugs?      No alcohol/drugs 5. NARCOTIC MEDICINES: Have they been receiving any narcotic medications? (e.g., morphine , Vicodin)     no 6. CAUSE: What do you think is causing the confusion?    Reaction to food- with seizure disorder 7. OTHER SYMPTOMS: Are there any other symptoms? 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  Answer Assessment - Initial Assessment Questions 1. REASON FOR CALL: What is your main concern right now?     Reaction to food- dark chocolate and hx seizure disorder 2. ONSET: When did the altered mental status start?     today 3. SEVERITY: How bad is the symptom?     Patient states she feels loopy sometimes hard to get thoughts out verbally- she knows what she wants to say- not confused- just loopy' 4. FUNCTIONAL IMPAIRMENT: How have things been going for you overall? Have you had more difficulty than usual doing your normal daily activities? (e.g., self-care, school, work, interactions)     Patient reports she is concerned about going to her second job today 5. RELIEVING AND AGGRAVATING FACTORS: What makes it better or worse? (e.g., certain activities, rest)     N/a 6. FEVER: Do you have a fever?     no 7. OTHER SYMPTOMS: Do you have any other new symptoms?     none 8. TREATMENTS AND RESPONSE: What have you done so far to try to make this better? What medicines have you used?     Patient reports she has taken her seizure medications for today and contact ed her neurologist- response was that she should be fine- she did not ingest a lot of chocolate.  She was advised to alert PCP- will send message for any further recommendations.  Protocols used: Confusion - Delirium-A-AH, No Guideline Available-A-AH   Copied from CRM [id number]. Topic: Clinical - Red Word Triage >> Aug 24, 2023 10:55 AM Tinnie BROCKS wrote: Red Word that prompted transfer to Nurse Triage: Pt  ate dark chocolate. Neurology previously told her to not eat dark chocolate due to her seizure disorder. She says she is feeling loopy/delirious after eating the dark chocolate. She states neurology told her to call the nursing staff at her PCP office.

## 2023-08-28 ENCOUNTER — Ambulatory Visit: Payer: Self-pay | Admitting: Family Medicine

## 2023-08-29 ENCOUNTER — Encounter: Payer: Self-pay | Admitting: Nurse Practitioner

## 2023-08-31 ENCOUNTER — Other Ambulatory Visit: Admitting: *Deleted

## 2023-09-02 ENCOUNTER — Emergency Department (HOSPITAL_BASED_OUTPATIENT_CLINIC_OR_DEPARTMENT_OTHER)
Admission: EM | Admit: 2023-09-02 | Discharge: 2023-09-02 | Disposition: A | Discharge status: Home / Self Care | Payer: Worker's Compensation | Attending: Emergency Medicine | Admitting: Emergency Medicine

## 2023-09-02 ENCOUNTER — Emergency Department (HOSPITAL_BASED_OUTPATIENT_CLINIC_OR_DEPARTMENT_OTHER)

## 2023-09-02 ENCOUNTER — Encounter (HOSPITAL_BASED_OUTPATIENT_CLINIC_OR_DEPARTMENT_OTHER): Payer: Self-pay

## 2023-09-02 DIAGNOSIS — Y990 Civilian activity done for income or pay: Secondary | ICD-10-CM | POA: Insufficient documentation

## 2023-09-02 DIAGNOSIS — M546 Pain in thoracic spine: Secondary | ICD-10-CM | POA: Insufficient documentation

## 2023-09-02 DIAGNOSIS — Z91040 Latex allergy status: Secondary | ICD-10-CM | POA: Diagnosis not present

## 2023-09-02 DIAGNOSIS — W01198A Fall on same level from slipping, tripping and stumbling with subsequent striking against other object, initial encounter: Secondary | ICD-10-CM | POA: Insufficient documentation

## 2023-09-02 DIAGNOSIS — R519 Headache, unspecified: Secondary | ICD-10-CM | POA: Diagnosis not present

## 2023-09-02 DIAGNOSIS — W19XXXA Unspecified fall, initial encounter: Secondary | ICD-10-CM

## 2023-09-02 LAB — PREGNANCY, URINE: Preg Test, Ur: NEGATIVE

## 2023-09-02 MED ORDER — ACETAMINOPHEN 500 MG PO TABS
1000.0000 mg | ORAL_TABLET | Freq: Once | ORAL | Status: AC
  Administered 2023-09-02: 1000 mg via ORAL
  Filled 2023-09-02: qty 2

## 2023-09-02 MED ORDER — KETOROLAC TROMETHAMINE 30 MG/ML IJ SOLN
30.0000 mg | Freq: Once | INTRAMUSCULAR | Status: DC
  Filled 2023-09-02: qty 1

## 2023-09-02 NOTE — ED Notes (Signed)

## 2023-09-02 NOTE — ED Provider Notes (Signed)
 [place] EMERGENCY DEPARTMENT AT [facility] Provider Note   CSN: [id number] Arrival date & time: 09/02/23  1107     Patient presents with: Fall and Back Pain  HPI Latoya Stark is a 28 y.o. female presenting for back head and neck pain after a fall yesterday.  States she slipped on a wet floor at work and fell backwards landing on her upper mid back and hitting her head.  Denies loss of consciousness.  She does states that her left hand trembles at times while she is using her phone.  Denies chest pain or abdominal pain.    Fall  Back Pain      Prior to Admission medications   Medication Sig Start Date End Date Taking? Authorizing Provider  albuterol  (VENTOLIN  HFA) 108 (90 Base) MCG/ACT inhaler Inhale 1-2 puffs into the lungs every 6 (six) hours as needed for wheezing or shortness of breath. 08/23/23   Gareth Mliss FALCON, FNP  busPIRone  (BUSPAR ) 5 MG tablet Take 1 tablet (5 mg total) by mouth 3 (three) times daily. 08/20/23   Gareth Mliss FALCON, FNP  cetirizine  (ZYRTEC ) 10 MG tablet Take 1 tablet (10 mg total) by mouth daily. 08/08/23   Pender, [name] F, FNP  clindamycin  (CLEOCIN ) 300 MG capsule Take 300 mg by mouth every 6 (six) hours. 07/30/23   [provider]  cyclobenzaprine  (FLEXERIL ) 10 MG tablet Take 0.5-1 tablets (5-10 mg total) by mouth 3 (three) times daily as needed. 08/23/23   Pender, [name] F, FNP  EPINEPHrine  (EPIPEN  2-PAK) 0.3 mg/0.3 mL IJ SOAJ injection Inject 0.3 mg into the muscle once as needed (for severe allergic reaction). CAll 911 immediately if you have to use this medicine 08/23/23   Pender, [name] F, FNP  gabapentin  (NEURONTIN ) 100 MG capsule Take 2 capsules (200 mg total) by mouth at bedtime. 08/07/23   Lomax, Amy, NP  hydrOXYzine  (VISTARIL ) 25 MG capsule TAKE 1 CAPSULE (25 MG TOTAL) BY MOUTH AT BEDTIME AS NEEDED FOR ANXIETY. 02/21/23   Pender, [name] F, FNP  ibuprofen  (IBU) 800 MG tablet Take 1 tablet (800 mg total) by mouth every 8 (eight) hours as  needed for moderate pain (pain score 4-6). 01/18/23   Pender, [name] F, FNP  lamoTRIgine  (LAMICTAL ) 200 MG tablet Take 1 tablet (200 mg total) by mouth 3 (three) times daily. 08/16/23   Lomax, Amy, NP  levETIRAcetam  (KEPPRA ) 750 MG tablet Take 2 tablets (1,500 mg total) by mouth 2 (two) times daily. 08/16/23   Lomax, Amy, NP  meloxicam  (MOBIC ) 7.5 MG tablet Take 1 tablet (7.5 mg total) by mouth daily. 08/20/23   Gareth Mliss FALCON, FNP  methylPREDNISolone  (MEDROL  DOSEPAK) 4 MG TBPK tablet Take as directed 08/02/23   Gershon Donnice SAUNDERS, DPM  oxyCODONE  (ROXICODONE ) 5 MG immediate release tablet Take 1 tablet (5 mg total) by mouth every 6 (six) hours as needed for severe pain (pain score 7-10) or breakthrough pain. 08/10/23   Smoot, Lauraine LABOR, PA-C  sertraline  (ZOLOFT ) 100 MG tablet Take 1 tablet (100 mg total) by mouth daily. 08/23/23   Gareth Mliss FALCON, FNP  SYMBICORT  80-4.5 MCG/ACT inhaler Inhale 2 puffs into the lungs 2 (two) times daily as needed (shortness of breath). 08/23/23   Gareth Mliss FALCON, FNP  tirzepatide  (ZEPBOUND ) 5 MG/0.5ML Pen Inject 5 mg into the skin once a week. 08/23/23   Pender, [name] F, FNP    Allergies: Hydrocodone , Milk-related compounds, Mushroom extract complex (obsolete), Penicillins, Cheese, Latex, and Sulfa antibiotics  Review of Systems  Musculoskeletal:  Positive for back pain.    Physical Exam   Vitals:   09/02/23 1453 09/02/23 1511  BP: (!) 103/55 (!) 104/55  Pulse: 60 68  Resp:  20  Temp: 97.9 F (36.6 C)   SpO2: 100% 100%    CONSTITUTIONAL:  well-appearing, NAD NEURO:  Alert and oriented x 3, CN 3-12 grossly intact EYES:  eyes equal and reactive ENT/NECK:  Supple, no stridor  CARDIO:  regular rate and rhythm, appears well-perfused  PULM:  No respiratory distress, CTAB GI/GU:  non-distended, soft non tender MSK/SPINE:  No gross deformities, no edema, moves all extremities, no evidence of trauma to the back, range of motion of the back is normal without pain.  No  tenderness of the midline.  SKIN:  no rash, atraumatic  *Additional and/or pertinent findings included in MDM below  (all labs ordered are listed, but only abnormal results are displayed) Labs Reviewed  PREGNANCY, URINE    EKG: None  Radiology: CT Thoracic Spine Wo Contrast Result Date: 09/02/2023 CLINICAL DATA:  Slipped on fell on wet floor. Blunt trauma. Thoracic back pain. EXAM: CT THORACIC SPINE WITHOUT CONTRAST TECHNIQUE: Multidetector CT images of the thoracic were obtained using the standard protocol without intravenous contrast. RADIATION DOSE REDUCTION: This exam was performed according to the departmental dose-optimization program which includes automated exposure control, adjustment of the mA and/or kV according to patient size and/or use of iterative reconstruction technique. COMPARISON:  None Available. FINDINGS: Alignment: Normal. Vertebrae: No acute fracture or other focal bone lesions. Paraspinal and other soft tissues: Unremarkable. Disc levels: No evidence of disc space narrowing. IMPRESSION: Negative. No thoracic spine fracture or subluxation. Electronically Signed   By: Norleen DELENA Kil M.D.   On: 09/02/2023 14:51   CT Cervical Spine Wo Contrast Result Date: 09/02/2023 CLINICAL DATA:  Slipped and fell on wet floor. Blunt trauma. Neck pain. EXAM: CT CERVICAL SPINE WITHOUT CONTRAST TECHNIQUE: Multidetector CT imaging of the cervical spine was performed without intravenous contrast. Multiplanar CT image reconstructions were also generated. RADIATION DOSE REDUCTION: This exam was performed according to the departmental dose-optimization program which includes automated exposure control, adjustment of the mA and/or kV according to patient size and/or use of iterative reconstruction technique. COMPARISON:  None Available. FINDINGS: Alignment: Normal. Skull base and vertebrae: No acute fracture. No primary bone lesion or focal pathologic process. Soft tissues and spinal canal: No  prevertebral fluid or swelling. No visible canal hematoma. Disc levels: No disc space narrowing. Upper chest: No acute findings. Other: None. IMPRESSION: Negative cervical spine CT. Electronically Signed   By: Norleen DELENA Kil M.D.   On: 09/02/2023 14:48   CT Head Wo Contrast Result Date: 09/02/2023 CLINICAL DATA:  Slipped and fell on wet floor. Blunt poly trauma. Headache. EXAM: CT HEAD WITHOUT CONTRAST TECHNIQUE: Contiguous axial images were obtained from the base of the skull through the vertex without intravenous contrast. RADIATION DOSE REDUCTION: This exam was performed according to the departmental dose-optimization program which includes automated exposure control, adjustment of the mA and/or kV according to patient size and/or use of iterative reconstruction technique. COMPARISON:  08/10/2023 FINDINGS: Brain: No evidence of intracranial hemorrhage, acute infarction, hydrocephalus, extra-axial collection, or mass lesion/mass effect. Vascular:  No hyperdense vessel or other acute findings. Skull: No evidence of fracture or other significant bone abnormality. Sinuses/Orbits:  No acute findings. Other: None. IMPRESSION: Negative noncontrast head CT. Electronically Signed   By: Norleen DELENA Kil M.D.   On: 09/02/2023 14:46  Procedures   Medications Ordered in the ED  ketorolac  (TORADOL ) 30 MG/ML injection 30 mg (has no administration in time range)  acetaminophen  (TYLENOL ) tablet 1,000 mg (1,000 mg Oral Given 09/02/23 1302)                                    Medical Decision Making Amount and/or Complexity of Data Reviewed Labs: ordered. Radiology: ordered.  Risk OTC drugs.   28 yo well appearing female presenting for fall.  Exam is unremarkable.  Suspect this is soft tissue injury in the back and neck.  CT scans were negative and there was no evidence of trauma on exam.  Advised supportive treatment with NSAIDs and Tylenol  at home.  Advised to follow-up with PCP.       Final diagnoses:   Fall, initial encounter    ED Discharge Orders     None          Lang Norleen POUR, PA-C 09/02/23 1519    Emil Share, [place] 09/03/23 [phone]

## 2023-09-02 NOTE — ED Notes (Signed)
 A warm blanket was provided to the patient to promote comfort. The patient expressed appreciation and appeared more relaxed following the collection of the urine at the bathroom . No signs of distress noted. Will continue to monitor for vitals signs.

## 2023-09-02 NOTE — ED Notes (Addendum)
 Pt reports no relief with tylenol . EDP John, PA notified

## 2023-09-02 NOTE — ED Notes (Signed)
Pt to restroom via wheelchair to provide urine specimen

## 2023-09-02 NOTE — ED Triage Notes (Signed)
 Patient slipped on a wet floor at work. She is complaining of back pain of the back of her head and neck. She also states when she holds something in her left hand it trembles. No other neuro deficits.

## 2023-09-02 NOTE — ED Notes (Signed)
Pharmacy and/or medications updated with patient

## 2023-09-02 NOTE — Discharge Instructions (Addendum)
 Evaluation today was overall reassuring.  CT scans were negative for acute injury.  Please follow-up with your PCP.  In the meantime recommend ibuprofen  and applying ice to your upper back for the next 4 days.  If symptoms worsen or change anyway please return for further evaluation.

## 2023-09-04 ENCOUNTER — Encounter: Payer: Self-pay | Admitting: *Deleted

## 2023-09-04 NOTE — Telephone Encounter (Signed)
 Placed letter in mail to pt about results.

## 2023-09-04 NOTE — Telephone Encounter (Signed)
 Called pt at [phone]. Mailbox full, unable to LVM.   I called mother (on [place]) at [phone]. LVM for her to call office or have daughter call office about results.

## 2023-09-05 ENCOUNTER — Other Ambulatory Visit: Admitting: *Deleted

## 2023-09-07 ENCOUNTER — Telehealth: Payer: Self-pay

## 2023-09-07 NOTE — Telephone Encounter (Signed)
 Insurance is requesting documentation of 4 weeks worth of therapy within the past 6 months and clinical notes with exam findings that showed problems with the joint (positive orthopedic sign consistent with laxity/instability). P2P may/may not be available since case is considered still in review, but the information for P2P is [phone] and reference tracking# [id number].

## 2023-09-13 ENCOUNTER — Encounter: Payer: Self-pay | Admitting: Podiatry

## 2023-09-13 ENCOUNTER — Ambulatory Visit (INDEPENDENT_AMBULATORY_CARE_PROVIDER_SITE_OTHER): Admitting: Podiatry

## 2023-09-13 VITALS — Ht 65.0 in | Wt 315.0 lb

## 2023-09-13 DIAGNOSIS — M7752 Other enthesopathy of left foot: Secondary | ICD-10-CM | POA: Diagnosis not present

## 2023-09-13 MED ORDER — MELOXICAM 15 MG PO TABS: 15.0000 mg | ORAL_TABLET | Freq: Every day | ORAL | 0 refills | Status: DC | PRN

## 2023-09-14 ENCOUNTER — Ambulatory Visit: Admitting: Nurse Practitioner

## 2023-09-16 NOTE — Progress Notes (Signed)
  Subjective:  Patient ID: Latoya Stark, female    DOB: March 08, 1995,  MRN: [id number]  Chief Complaint  Patient presents with   Ankle Pain    Pt is here to f/u on left ankle pain, she states her ankle has been swelling, still some pain 7/10, continues to wear cam boot while ambulating.    History of Present Illness Latoya Stark is a 28 year old female with a history of left ankle fracture who presents with left ankle pain.  She has been immobilized in the cam boot which helps when she wears it but the end of the day she gets pain to her ankle and she points in the anterior aspect of her ankle.  MRI was previously ordered however this was denied by insurance.   Objective:    Physical Exam General: AAO x3, NAD  Dermatological: Skin is warm, dry and supple bilateral.  There are no open sores, no preulcerative lesions, no rash or signs of infection present.  Vascular: Dorsalis Pedis artery and Posterior Tibial artery pedal pulses are 2/4 bilateral with immedate capillary fill time.  There is no pain with calf compression, swelling, warmth, erythema.   Neruologic: Grossly intact via light touch bilateral.   Musculoskeletal: There is tenderness to palpation on the left ankle joint itself.  Majority tenderness is localized on the anterior medial aspect the ankle joint.  She also could have some discomfort in the course of the flexor, peroneal tendon still.  No pain in the Achilles tendon.  There is no area of pinpoint tenderness.    Assessment:   1. Capsulitis of left ankle     Plan:  Patient was evaluated and treated and all questions answered.  Assessment and Plan Assessment & Plan - Today we discussed steroid injection and risks.  She wishes to proceed and verbal consent obtained.  I cleaned the skin with Betadine, alcohol.  Mixture 1 cc Kenalog 10, 0.5 cc of Marcaine  plain, 0.5 cc of lidocaine  plain was infiltrated into the anterior medial aspect of the ankle joint  without complications.  Postinjection care discussed.  Tolerated well. - Remain in cam boot for now but if she starts to improve she can start to transition to a supportive sneaker. - Referral to physical therapy  No follow-ups on file.  Donnice JONELLE Fees DPM

## 2023-09-18 ENCOUNTER — Other Ambulatory Visit (HOSPITAL_COMMUNITY)
Admission: RE | Admit: 2023-09-18 | Discharge: 2023-09-18 | Disposition: A | Discharge status: Home / Self Care | Payer: Self-pay | Source: Ambulatory Visit | Attending: Family Medicine | Admitting: Family Medicine

## 2023-09-18 ENCOUNTER — Emergency Department (HOSPITAL_BASED_OUTPATIENT_CLINIC_OR_DEPARTMENT_OTHER): Payer: Self-pay

## 2023-09-18 ENCOUNTER — Other Ambulatory Visit: Admitting: *Deleted

## 2023-09-18 ENCOUNTER — Encounter: Payer: Self-pay | Admitting: Family Medicine

## 2023-09-18 ENCOUNTER — Other Ambulatory Visit: Payer: Self-pay

## 2023-09-18 ENCOUNTER — Ambulatory Visit (INDEPENDENT_AMBULATORY_CARE_PROVIDER_SITE_OTHER): Payer: Self-pay | Admitting: Family Medicine

## 2023-09-18 ENCOUNTER — Ambulatory Visit: Payer: Self-pay

## 2023-09-18 ENCOUNTER — Emergency Department (HOSPITAL_BASED_OUTPATIENT_CLINIC_OR_DEPARTMENT_OTHER)
Admission: EM | Admit: 2023-09-18 | Discharge: 2023-09-18 | Disposition: A | Discharge status: Home / Self Care | Payer: Self-pay | Source: Ambulatory Visit | Attending: Emergency Medicine | Admitting: Emergency Medicine

## 2023-09-18 VITALS — BP 122/70 | HR 91 | Resp 16 | Ht 65.0 in | Wt 331.0 lb

## 2023-09-18 DIAGNOSIS — R1030 Lower abdominal pain, unspecified: Secondary | ICD-10-CM

## 2023-09-18 DIAGNOSIS — N926 Irregular menstruation, unspecified: Secondary | ICD-10-CM

## 2023-09-18 DIAGNOSIS — Z91040 Latex allergy status: Secondary | ICD-10-CM | POA: Diagnosis not present

## 2023-09-18 DIAGNOSIS — K5909 Other constipation: Secondary | ICD-10-CM | POA: Diagnosis not present

## 2023-09-18 DIAGNOSIS — R1032 Left lower quadrant pain: Secondary | ICD-10-CM | POA: Diagnosis not present

## 2023-09-18 DIAGNOSIS — R102 Pelvic and perineal pain: Secondary | ICD-10-CM | POA: Diagnosis not present

## 2023-09-18 DIAGNOSIS — R112 Nausea with vomiting, unspecified: Secondary | ICD-10-CM

## 2023-09-18 DIAGNOSIS — Z3201 Encounter for pregnancy test, result positive: Secondary | ICD-10-CM

## 2023-09-18 DIAGNOSIS — R590 Localized enlarged lymph nodes: Secondary | ICD-10-CM | POA: Insufficient documentation

## 2023-09-18 DIAGNOSIS — R1031 Right lower quadrant pain: Secondary | ICD-10-CM | POA: Diagnosis not present

## 2023-09-18 LAB — COMPREHENSIVE METABOLIC PANEL WITH GFR
ALT: 16 U/L (ref 0–44)
AST: 20 U/L (ref 15–41)
Albumin: 4.3 g/dL (ref 3.5–5.0)
Alkaline Phosphatase: 100 U/L (ref 38–126)
Anion gap: 12 (ref 5–15)
BUN: 13 mg/dL (ref 6–20)
CO2: 23 mmol/L (ref 22–32)
Calcium: 9.4 mg/dL (ref 8.9–10.3)
Chloride: 103 mmol/L (ref 98–111)
Creatinine, Ser: 0.64 mg/dL (ref 0.44–1.00)
GFR, Estimated: >60 mL/min (ref >60)
Glucose, Bld: 98 mg/dL (ref 70–99)
Potassium: 3.8 mmol/L (ref 3.5–5.1)
Sodium: 138 mmol/L (ref 135–145)
Total Bilirubin: 0.2 mg/dL (ref 0.0–1.2)
Total Protein: 7.4 g/dL (ref 6.5–8.1)

## 2023-09-18 LAB — CBC
HCT: 35.6 % — ABNORMAL LOW (ref 36.0–46.0)
Hemoglobin: 11.7 g/dL — ABNORMAL LOW (ref 12.0–15.0)
MCH: 28.4 pg (ref 26.0–34.0)
MCHC: 32.9 g/dL (ref 30.0–36.0)
MCV: 86.4 fL (ref 80.0–100.0)
Platelets: 360 K/uL (ref 150–400)
RBC: 4.12 MIL/uL (ref 3.87–5.11)
RDW: 13.2 % (ref 11.5–15.5)
WBC: 6.6 K/uL (ref 4.0–10.5)
nRBC: 0 % (ref 0.0–0.2)

## 2023-09-18 LAB — URINALYSIS, ROUTINE W REFLEX MICROSCOPIC
Bilirubin Urine: NEGATIVE
Glucose, UA: NEGATIVE mg/dL
Hgb urine dipstick: NEGATIVE
Ketones, ur: NEGATIVE mg/dL
Leukocytes,Ua: NEGATIVE
Nitrite: NEGATIVE
Specific Gravity, Urine: 1.036 — ABNORMAL HIGH (ref 1.005–1.030)
pH: 6.5 (ref 5.0–8.0)

## 2023-09-18 LAB — POCT URINALYSIS DIPSTICK
Appearance: NORMAL
Bilirubin, UA: NEGATIVE
Blood, UA: NEGATIVE
Glucose, UA: NEGATIVE
Ketones, UA: NEGATIVE
Leukocytes, UA: NEGATIVE
Nitrite, UA: NEGATIVE
Protein, UA: NEGATIVE
Spec Grav, UA: 1.025 (ref 1.010–1.025)
Urobilinogen, UA: 0.2 U/dL
pH, UA: 6 (ref 5.0–8.0)

## 2023-09-18 LAB — LIPASE, BLOOD: Lipase: 30 U/L (ref 11–51)

## 2023-09-18 LAB — PREGNANCY, URINE: Preg Test, Ur: NEGATIVE

## 2023-09-18 LAB — POCT URINE PREGNANCY: Preg Test, Ur: NEGATIVE

## 2023-09-18 MED ORDER — DIPHENHYDRAMINE HCL 50 MG/ML IJ SOLN
25.0000 mg | Freq: Once | INTRAMUSCULAR | Status: AC
  Administered 2023-09-18: 25 mg via INTRAVENOUS
  Filled 2023-09-18: qty 1

## 2023-09-18 MED ORDER — SODIUM CHLORIDE 0.9 % IV BOLUS
1000.0000 mL | Freq: Once | INTRAVENOUS | Status: AC
  Administered 2023-09-18: 1000 mL via INTRAVENOUS

## 2023-09-18 MED ORDER — HYDROMORPHONE HCL 1 MG/ML IJ SOLN
0.5000 mg | Freq: Once | INTRAMUSCULAR | Status: AC
  Administered 2023-09-18: 0.5 mg via INTRAVENOUS
  Filled 2023-09-18: qty 1

## 2023-09-18 MED ORDER — METOCLOPRAMIDE HCL 5 MG/ML IJ SOLN
10.0000 mg | Freq: Once | INTRAMUSCULAR | Status: AC
  Administered 2023-09-18: 10 mg via INTRAVENOUS
  Filled 2023-09-18: qty 2

## 2023-09-18 MED ORDER — ONDANSETRON 4 MG PO TBDP
4.0000 mg | ORAL_TABLET | Freq: Three times a day (TID) | ORAL | 0 refills | Status: DC | PRN
  Filled 2023-09-18: qty 20, 4d supply, fill #0

## 2023-09-18 MED ORDER — IOHEXOL 300 MG/ML  SOLN
100.0000 mL | Freq: Once | INTRAMUSCULAR | Status: AC | PRN
  Administered 2023-09-18: 100 mL via INTRAVENOUS

## 2023-09-18 MED ORDER — ONDANSETRON HCL 4 MG/2ML IJ SOLN
4.0000 mg | Freq: Once | INTRAMUSCULAR | Status: AC
  Administered 2023-09-18: 4 mg via INTRAVENOUS
  Filled 2023-09-18: qty 2

## 2023-09-18 MED ORDER — OXYCODONE HCL 5 MG PO TABS: 5.0000 mg | ORAL_TABLET | Freq: Four times a day (QID) | ORAL | 0 refills | Status: DC | PRN

## 2023-09-18 NOTE — ED Provider Notes (Signed)
 [place] EMERGENCY DEPARTMENT AT [facility] Provider Note   CSN: [id number] Arrival date & time: 09/18/23  1221     Patient presents with: Abdominal Pain   Latoya Stark is a 28 y.o. female.   Patient with history of abdominal surgery including hernia repair, tumor removal --presents to the emergency department for evaluation of abdominal pain.  She reports approximately 2 weeks of lower abdominal pain, intermittent at first, becoming more persistent over the past 3 to 4 days.  She has had occasional episodes of vomiting, last episode was 3 days ago.  She has constipation at baseline.  No current vaginal bleeding or discharge.  She is urinating normally without pain.  Patient also complains of chronic migraines and lower back pain from a fall in August, which are also making her feel bad.  She did present to the emergency department after this injury on 8/24, had CT head, cervical spine and thoracic spine which were negative.  She was seen by primary care provider today, referred to the emergency department for consideration of imaging due to abdominal pain.       Prior to Admission medications   Medication Sig Start Date End Date Taking? Authorizing Provider  albuterol  (VENTOLIN  HFA) 108 (90 Base) MCG/ACT inhaler Inhale 1-2 puffs into the lungs every 6 (six) hours as needed for wheezing or shortness of breath. 08/23/23   Gareth Mliss FALCON, FNP  busPIRone  (BUSPAR ) 5 MG tablet Take 1 tablet (5 mg total) by mouth 3 (three) times daily. 08/20/23   Gareth Mliss FALCON, FNP  cetirizine  (ZYRTEC ) 10 MG tablet Take 1 tablet (10 mg total) by mouth daily. 08/08/23   Pender, [name] F, FNP  clindamycin  (CLEOCIN ) 300 MG capsule Take 300 mg by mouth every 6 (six) hours. 07/30/23   [provider]  cyclobenzaprine  (FLEXERIL ) 10 MG tablet Take 0.5-1 tablets (5-10 mg total) by mouth 3 (three) times daily as needed. 08/23/23   Pender, [name] F, FNP  EPINEPHrine  (EPIPEN  2-PAK) 0.3 mg/0.3 mL IJ SOAJ  injection Inject 0.3 mg into the muscle once as needed (for severe allergic reaction). CAll 911 immediately if you have to use this medicine 08/23/23   Pender, [name] F, FNP  gabapentin  (NEURONTIN ) 100 MG capsule Take 2 capsules (200 mg total) by mouth at bedtime. 08/07/23   Lomax, Amy, NP  hydrOXYzine  (VISTARIL ) 25 MG capsule TAKE 1 CAPSULE (25 MG TOTAL) BY MOUTH AT BEDTIME AS NEEDED FOR ANXIETY. 02/21/23   Pender, [name] F, FNP  ibuprofen  (IBU) 800 MG tablet Take 1 tablet (800 mg total) by mouth every 8 (eight) hours as needed for moderate pain (pain score 4-6). 01/18/23   Pender, [name] F, FNP  lamoTRIgine  (LAMICTAL ) 200 MG tablet Take 1 tablet (200 mg total) by mouth 3 (three) times daily. 08/16/23   Lomax, Amy, NP  levETIRAcetam  (KEPPRA ) 750 MG tablet Take 2 tablets (1,500 mg total) by mouth 2 (two) times daily. 08/16/23   Lomax, Amy, NP  meloxicam  (MOBIC ) 15 MG tablet Take 1 tablet (15 mg total) by mouth daily as needed for pain. 09/13/23 09/12/24  Gershon Donnice SAUNDERS, DPM  meloxicam  (MOBIC ) 7.5 MG tablet Take 1 tablet (7.5 mg total) by mouth daily. 08/20/23   Gareth Mliss FALCON, FNP  methylPREDNISolone  (MEDROL  DOSEPAK) 4 MG TBPK tablet Take as directed 08/02/23   Gershon Donnice SAUNDERS, DPM  ondansetron  (ZOFRAN -ODT) 4 MG disintegrating tablet Take 1-2 tablets (4-8 mg total) by mouth every 8 (eight) hours as needed for nausea or vomiting.  09/18/23   Tapia, Leisa, PA-C  oxyCODONE  (ROXICODONE ) 5 MG immediate release tablet Take 1 tablet (5 mg total) by mouth every 6 (six) hours as needed for severe pain (pain score 7-10) or breakthrough pain. 08/10/23   Smoot, Lauraine LABOR, PA-C  sertraline  (ZOLOFT ) 100 MG tablet Take 1 tablet (100 mg total) by mouth daily. 08/23/23   Gareth Mliss FALCON, FNP  SYMBICORT  80-4.5 MCG/ACT inhaler Inhale 2 puffs into the lungs 2 (two) times daily as needed (shortness of breath). 08/23/23   Gareth Mliss FALCON, FNP  tirzepatide  (ZEPBOUND ) 5 MG/0.5ML Pen Inject 5 mg into the skin once a week. 08/23/23   Gareth Mliss FALCON, FNP    Allergies: Hydrocodone , Milk-related compounds, Mushroom extract complex (obsolete), Penicillins, Cheese, Latex, and Sulfa antibiotics    Review of Systems  Updated Vital Signs BP (!) 135/110 (BP Location: Left Wrist)   Pulse 96   Temp 97.9 F (36.6 C)   Resp 20   LMP 09/03/2023 (Approximate)   SpO2 98%   Physical Exam Vitals and nursing note reviewed.  Constitutional:      General: She is not in acute distress.    Appearance: She is well-developed.  HENT:     Head: Normocephalic and atraumatic.     Right Ear: External ear normal.     Left Ear: External ear normal.     Nose: Nose normal.  Eyes:     Conjunctiva/sclera: Conjunctivae normal.  Cardiovascular:     Rate and Rhythm: Normal rate and regular rhythm.     Heart sounds: No murmur heard. Pulmonary:     Effort: No respiratory distress.     Breath sounds: No wheezing, rhonchi or rales.  Abdominal:     Palpations: Abdomen is soft.     Tenderness: There is abdominal tenderness in the right lower quadrant, suprapubic area and left lower quadrant. There is no guarding or rebound. Negative signs include Murphy's sign and McBurney's sign.  Musculoskeletal:     Cervical back: Normal range of motion and neck supple.     Right lower leg: No edema.     Left lower leg: No edema.  Skin:    General: Skin is warm and dry.     Findings: No rash.  Neurological:     General: No focal deficit present.     Mental Status: She is alert. Mental status is at baseline.     Motor: No weakness.  Psychiatric:        Mood and Affect: Mood normal.     (all labs ordered are listed, but only abnormal results are displayed) Labs Reviewed  CBC - Abnormal; Notable for the following components:      Result Value   Hemoglobin 11.7 (*)    HCT 35.6 (*)    All other components within normal limits  URINALYSIS, ROUTINE W REFLEX MICROSCOPIC - Abnormal; Notable for the following components:   Specific Gravity, Urine 1.036 (*)     Protein, ur TRACE (*)    All other components within normal limits  LIPASE, BLOOD  COMPREHENSIVE METABOLIC PANEL WITH GFR  PREGNANCY, URINE    EKG: None  Radiology: No results found.   Procedures   Medications Ordered in the ED  sodium chloride  0.9 % bolus 1,000 mL (has no administration in time range)  HYDROmorphone  (DILAUDID ) injection 0.5 mg (has no administration in time range)  ondansetron  (ZOFRAN ) injection 4 mg (has no administration in time range)   ED Course  Patient seen and  examined. History obtained directly from patient. Work-up including labs, imaging, EKG ordered in triage, if performed, were reviewed.    Labs/EKG: Independently reviewed and interpreted.  This included: CBC with normal white blood cell count, mild anemia with hemoglobin 11.7; CMP unremarkable; lipase normal; pregnancy negative; UA concentrated but without signs of infection or bleeding.  Imaging: Ordered CT of the abdomen pelvis given 2 weeks of lower abdominal pain, worsening, unknown etiology.  Medications/Fluids: Ordered: Hydromorphone  0.5 mg IV, Zofran  0.4 mg IV, fluid bolus given concentrated urine.  Most recent vital signs reviewed and are as follows: BP (!) 135/110 (BP Location: Left Wrist)   Pulse 96   Temp 97.9 F (36.6 C)   Resp 20   LMP 09/03/2023 (Approximate)   SpO2 98%   Initial impression: Bilateral lower abdominal pain  //  Reassessment performed. Patient appears stable, headache improved with treatment, abdominal pain improved with treatment.  Imaging personally visualized and interpreted including: CT of the abdomen pelvis, agree no emergent findings, per radiology report progressive enlargement of multiple small mesenteric and retroperitoneal lymph nodes with generalized mesenteric edema.  Recommend correlation with serum CBC and hematology oncology follow-up.  Reviewed pertinent lab work and imaging with patient at bedside. Questions answered.   Most current vital  signs reviewed and are as follows: BP (!) 129/92 (BP Location: Right Arm)   Pulse 70   Temp 98.4 F (36.9 C) (Oral)   Resp 14   LMP 09/03/2023 (Approximate)   SpO2 98%   Plan: Discharge to home.   Prescriptions written for: # 5 tablets oxycodone .  Zofran  was sent in for the patient earlier today.  Other home care instructions discussed: Avoidance of triggers, close monitoring of symptoms  ED return instructions discussed: The patient was urged to return to the Emergency Department immediately with worsening of current symptoms, worsening abdominal pain, persistent vomiting, blood noted in stools, fever, or any other concerns. The patient verbalized understanding.   Follow-up instructions discussed: Patient encouraged to follow-up with their PCP in 3 days.  Encouraged outpatient hematology oncology follow-up per radiology report for evaluation of progressive mesenteric and retroperitoneal lymphadenopathy.                                   Medical Decision Making Amount and/or Complexity of Data Reviewed Labs: ordered. Radiology: ordered.  Risk Prescription drug management.   For this patient's complaint of abdominal pain, the following conditions were considered on the differential diagnosis: gastritis/PUD, enteritis/duodenitis, appendicitis, cholelithiasis/cholecystitis, cholangitis, pancreatitis, ruptured viscus, colitis, diverticulitis, small/large bowel obstruction, proctitis, cystitis, pyelonephritis, ureteral colic, aortic dissection, aortic aneurysm. In women, ectopic pregnancy, pelvic inflammatory disease, ovarian cysts, and tubo-ovarian abscess were also considered. Atypical chest etiologies were also considered including ACS, PE, and pneumonia.  Today the patient's workup is reassuring.  Lab workup with normal white blood cell count, normal liver and kidney function, only minimal anemia.  CT scan shows mesenteric edema with enlarged mesenteric and retroperitoneal lymph  nodes.  Advised outpatient follow-up.  Unclear if this could be related to the patient's abdominal pain.  No other concerning findings noted today.  Will focus on symptom control at this time with monitoring and return with worsening.  The patient's vital signs, pertinent lab work and imaging were reviewed and interpreted as discussed in the ED course. Hospitalization was considered for further testing, treatments, or serial exams/observation. However as patient is well-appearing, has a stable exam, and reassuring  studies today, I do not feel that they warrant admission at this time. This plan was discussed with the patient who verbalizes agreement and comfort with this plan and seems reliable and able to return to the Emergency Department with worsening or changing symptoms.        Final diagnoses:  Mesenteric lymphadenopathy  Lower abdominal pain    ED Discharge Orders          Ordered    Ambulatory referral to Hematology / Oncology       Comments: Your emergency department provider has referred you to see a hematology/oncology specialist. These are physicians who specialize in blood disorders and cancers, or findings concerning for cancer. You will receive a phone call from the [facility] to set up your appointment within 2 business days: [employer] operate Mon - Fri, 8:00 a.m. to 5:00 p.m.; closed for federally recognized holidays. Please be sure your phone is not set to block numbers during this time.   09/18/23 1619    oxyCODONE  (OXY IR/ROXICODONE ) 5 MG immediate release tablet  Every 6 hours PRN        09/18/23 1646               Desiderio Chew, PA-C 09/18/23 1741    Jerrol Agent, MD 09/19/23 1047

## 2023-09-18 NOTE — Telephone Encounter (Signed)
 FYI Only or Action Required?: FYI only for provider.  Patient was last seen in primary care on 08/23/2023 by Gareth Mliss FALCON, FNP.  Called Nurse Triage reporting Abdominal Pain back pain nausea, vomiting.  Symptoms began several days ago.  Interventions attempted: Nothing.  Symptoms are: unchanged.  Triage Disposition: See Physician Within 24 Hours  Patient/caregiver understands and will follow disposition?: Yes               Copied from CRM #[id number]. Topic: Clinical - Red Word Triage >> Sep 18, 2023  8:05 AM Treva T wrote: [facility] that prompted transfer to Nurse Triage: Received call from patient, reports symtpoms of lower abdominal and back pain, nausea, and has not been feeling well. Reason for Disposition  [1] MODERATE pain (e.g., interferes with normal activities) AND [2] pain comes and goes (cramps) AND [3] present > 24 hours  (Exception: Pain with Vomiting or Diarrhea - see that Guideline.)  Answer Assessment - Initial Assessment Questions 1. LOCATION: Where does it hurt?      Abdomen, back 2. RADIATION: Does the pain shoot anywhere else? (e.g., chest, back)     no 3. ONSET: When did the pain begin? (e.g., minutes, hours or days ago)      Thursday 4. SUDDEN: Gradual or sudden onset?     Gradual  5. PATTERN Does the pain come and go, or is it constant?     Comes and goes 6. SEVERITY: How bad is the pain?  (e.g., Scale 1-10; mild, moderate, or severe)     moderate 9. RELIEVING/AGGRAVATING FACTORS: What makes it better or worse? (e.g., antacids, bending or twisting motion, bowel movement)     Certain positions are better 10. OTHER SYMPTOMS: Do you have any other symptoms? (e.g., back pain, diarrhea, fever, urination pain, vomiting)       Had vomiting - which resolved  - nausea  Protocols used: Abdominal Pain - Female-A-AH

## 2023-09-18 NOTE — Progress Notes (Signed)
 Patient ID: Latoya Stark, female    DOB: 02-06-1995, 28 y.o.   MRN: [id number]  PCP: Gareth Mliss FALCON, FNP  Chief Complaint  Patient presents with   Abdominal Pain    X2 days, no diarrhea or vomiting. Across lower abdomin     Subjective:   Latoya Stark is a 28 y.o. female, presents to clinic with CC of the following:  Vomiting early in am with nausea Lower abd pain intermittent a week ago and getting worse over the last week  Chronic severe constipation managed with Dr. Therisa, hx of having a BM once a month, last BM on 8/22  Abdominal Pain This is a new problem. The current episode started 1 to 4 weeks ago. The onset quality is gradual. The problem occurs intermittently. Duration: over a week. The problem has been gradually worsening. The abdominal pain does not radiate. Associated symptoms include constipation, nausea and vomiting. Pertinent negatives include no diarrhea, dysuria, fever, frequency or hematuria.    Last month Aug early short bleeding 1-4th then 20-25th Normally menses is at the end to beginning of month June and July menses was first week of month Took home pregnancy test and it was positive sometime in August  Sexually active hx of many miscarriages, OCP with GYN stopped about 6 months ago    Patient Active Problem List   Diagnosis Date Noted   Chronic midline low back pain with bilateral sciatica 08/23/2023   Class 3 severe obesity due to excess calories with serious comorbidity and body mass index (BMI) of 50.0 to 59.9 in adult 02/15/2023   Mild persistent asthma without complication 11/24/2022   Anemia 11/24/2022   Mild episode of recurrent major depressive disorder (HCC) 11/24/2022   Anxiety 11/24/2022   Lipoma of neck 11/16/2022   Dysphagia 11/10/2022   Chronic migraine without aura without status migrainosus, not intractable 06/22/2020   BMI 50.0-59.9, adult (HCC) 02/20/2019   Conversion disorder 02/10/2019   History of seizure 07/30/2018    Other constipation 02/24/2014      Current Outpatient Medications:    albuterol  (VENTOLIN  HFA) 108 (90 Base) MCG/ACT inhaler, Inhale 1-2 puffs into the lungs every 6 (six) hours as needed for wheezing or shortness of breath., Disp: 18 g, Rfl: 11   busPIRone  (BUSPAR ) 5 MG tablet, Take 1 tablet (5 mg total) by mouth 3 (three) times daily., Disp: 90 tablet, Rfl: 0   cetirizine  (ZYRTEC ) 10 MG tablet, Take 1 tablet (10 mg total) by mouth daily., Disp: 30 tablet, Rfl: 0   clindamycin  (CLEOCIN ) 300 MG capsule, Take 300 mg by mouth every 6 (six) hours., Disp: , Rfl:    cyclobenzaprine  (FLEXERIL ) 10 MG tablet, Take 0.5-1 tablets (5-10 mg total) by mouth 3 (three) times daily as needed., Disp: 30 tablet, Rfl: 0   EPINEPHrine  (EPIPEN  2-PAK) 0.3 mg/0.3 mL IJ SOAJ injection, Inject 0.3 mg into the muscle once as needed (for severe allergic reaction). CAll 911 immediately if you have to use this medicine, Disp: 2 each, Rfl: 1   gabapentin  (NEURONTIN ) 100 MG capsule, Take 2 capsules (200 mg total) by mouth at bedtime., Disp: 180 capsule, Rfl: 0   hydrOXYzine  (VISTARIL ) 25 MG capsule, TAKE 1 CAPSULE (25 MG TOTAL) BY MOUTH AT BEDTIME AS NEEDED FOR ANXIETY., Disp: 90 capsule, Rfl: 0   ibuprofen  (IBU) 800 MG tablet, Take 1 tablet (800 mg total) by mouth every 8 (eight) hours as needed for moderate pain (pain score 4-6)., Disp: 90 tablet, Rfl:  1   lamoTRIgine  (LAMICTAL ) 200 MG tablet, Take 1 tablet (200 mg total) by mouth 3 (three) times daily., Disp: 90 tablet, Rfl: 0   levETIRAcetam  (KEPPRA ) 750 MG tablet, Take 2 tablets (1,500 mg total) by mouth 2 (two) times daily., Disp: 120 tablet, Rfl: 0   meloxicam  (MOBIC ) 15 MG tablet, Take 1 tablet (15 mg total) by mouth daily as needed for pain., Disp: 30 tablet, Rfl: 0   meloxicam  (MOBIC ) 7.5 MG tablet, Take 1 tablet (7.5 mg total) by mouth daily., Disp: 30 tablet, Rfl: 0   methylPREDNISolone  (MEDROL  DOSEPAK) 4 MG TBPK tablet, Take as directed, Disp: 21 tablet, Rfl: 0    oxyCODONE  (ROXICODONE ) 5 MG immediate release tablet, Take 1 tablet (5 mg total) by mouth every 6 (six) hours as needed for severe pain (pain score 7-10) or breakthrough pain., Disp: 5 tablet, Rfl: 0   sertraline  (ZOLOFT ) 100 MG tablet, Take 1 tablet (100 mg total) by mouth daily., Disp: 90 tablet, Rfl: 1   SYMBICORT  80-4.5 MCG/ACT inhaler, Inhale 2 puffs into the lungs 2 (two) times daily as needed (shortness of breath)., Disp: 10.2 g, Rfl: 10   tirzepatide  (ZEPBOUND ) 5 MG/0.5ML Pen, Inject 5 mg into the skin once a week., Disp: 2 mL, Rfl: 0   Allergies  Allergen Reactions   Hydrocodone  Hives   Milk-Related Compounds Dermatitis    Breaks face out *can have 2% milk*   Mushroom Extract Complex (Obsolete) Anaphylaxis   Penicillins Anaphylaxis    Has patient had a PCN reaction causing immediate rash, facial/tongue/throat swelling, SOB or lightheadedness with hypotension: Yes Has patient had a PCN reaction causing severe rash involving mucus membranes or skin necrosis: No Has patient had a PCN reaction that required hospitalization No Has patient had a PCN reaction occurring within the last 10 years: Yes If all of the above answers are NO, then may proceed with Cephalosporin use.    Cheese Other (See Comments)    Breaks face out   Latex Itching and Swelling   Sulfa Antibiotics Swelling     Social History   Tobacco Use   Smoking status: Never    Passive exposure: Never   Smokeless tobacco: Never  Vaping Use   Vaping status: Never Used  Substance Use Topics   Alcohol use: Yes    Comment: occassional   Drug use: No      Chart Review Today: I personally reviewed active problem list, medication list, allergies, family history, social history, health maintenance, notes from last encounter, lab results, imaging with the patient/caregiver today.   Review of Systems  Constitutional: Negative.  Negative for activity change, appetite change, chills, diaphoresis, fatigue and fever.   HENT: Negative.    Eyes: Negative.   Respiratory: Negative.    Cardiovascular: Negative.   Gastrointestinal:  Positive for abdominal pain, constipation, nausea and vomiting. Negative for anal bleeding, blood in stool and diarrhea.  Endocrine: Negative.   Genitourinary:  Positive for menstrual problem, pelvic pain and vaginal bleeding. Negative for decreased urine volume, difficulty urinating, dyspareunia, dysuria, enuresis, flank pain, frequency, genital sores, hematuria, urgency, vaginal discharge and vaginal pain.  Musculoskeletal: Negative.   Skin: Negative.   Allergic/Immunologic: Negative.   Neurological: Negative.   Hematological: Negative.   Psychiatric/Behavioral: Negative.    All other systems reviewed and are negative.      Objective:   Vitals:   09/18/23 0959  BP: 122/70  Pulse: 91  Resp: 16  SpO2: 97%  Weight: (!) 331 lb (150.1  kg)  Height: 5' 5 (1.651 m)    Body mass index is 55.08 kg/m.  Physical Exam Vitals and nursing note reviewed.  Constitutional:      General: She is not in acute distress.    Appearance: Normal appearance. She is well-developed. She is morbidly obese. She is not ill-appearing, toxic-appearing or diaphoretic.  HENT:     Head: Normocephalic and atraumatic.     Right Ear: External ear normal.     Left Ear: External ear normal.     Nose: Nose normal.  Eyes:     General: No scleral icterus.       Right eye: No discharge.        Left eye: No discharge.     Conjunctiva/sclera: Conjunctivae normal.  Neck:     Trachea: No tracheal deviation.  Cardiovascular:     Rate and Rhythm: Normal rate.  Pulmonary:     Effort: Pulmonary effort is normal. No respiratory distress.     Breath sounds: No stridor.  Abdominal:     General: Abdomen is protuberant.     Tenderness: There is abdominal tenderness in the right lower quadrant, suprapubic area and left lower quadrant. There is no right CVA tenderness, left CVA tenderness, guarding or  rebound.     Comments: Exam limited by body habitus  Skin:    General: Skin is warm and dry.     Findings: No rash.  Neurological:     Mental Status: She is alert.     Motor: No abnormal muscle tone.     Coordination: Coordination normal.     Gait: Gait normal.  Psychiatric:        Mood and Affect: Mood normal.        Behavior: Behavior normal.      Results for orders placed or performed during the hospital encounter of 09/02/23  Pregnancy, urine   Collection Time: 09/02/23 12:57 PM  Result Value Ref Range   Preg Test, Ur NEGATIVE NEGATIVE       Assessment & Plan:   1. Lower abdominal pain (Primary) Ddx includes UTI, colitis, constipation, cannot r/o pregnancy or recent spontansous abortion dates vague with when she had a postiive home pregnancy test but then has vag bleeding afterwards which she suspected was a miscarriage, she thought about seeing her OBGYN but did not get an appt Vaginal swab offered, one partner, no vaginal discharge - offered to r/o other etiology of low abd/pelvic pain  2. Chronic constipation Same as baseline sx/bowel freq, no past pain like this related to her severe chronic constipation  3. Irregular menses Menses first week of June, first week of July, short irregular bleeding beginning of Aug for a few days, then bleeding aug 20-25?  Urine preg here if positive will need to do quant HCG  4. Positive pregnancy test At home sometime in the past couple week In chart neg urine preg 8/25 Once pt can do urine sample will do POC urine preg  Discussed OBGYN f/up ASAP Also reviewed indications for presenting to the ER - including but not limited to worsening abd pain, heavy vaginal bleeding, near syncope, worsening N/V or with any concerns for retained products of conception or ectopic with delay in outpt work up or access to OBGYN pt was instructed to go to ED    Pt UA unremarkable, urine preg neg, tender on exam rated 10/10 worsening pain, discussed  pending cytology and urine culture and possible imaging - discussed doing stat CT  abd/pelvis but she states her insurance is not currently showing active but inactive - with severe pain and inability to arrange a stat outpt CT with insurance issues pt will present to the ER to r/o more concerning etiology of worsening lower 10/10 abd pain - will go to [facility].   Michelene Cower, PA-C 09/18/23 10:12 AM

## 2023-09-18 NOTE — Discharge Instructions (Addendum)
 Please read and follow all provided instructions.  Your diagnoses today include:  1. Lower abdominal pain   2. Mesenteric lymphadenopathy     Tests performed today include: Complete blood cell count: Shows normal white blood cell count and mild anemia Complete metabolic panel: Lipase (pancreas function test): Normal Urinalysis (urine test): No signs of infection Pregnancy test (urine or blood, in women only): Was negative CT scan of the abdomen pelvis did not show any concerning causes of your abdominal pain today, but you do have some swollen lymph nodes in the abdomen and hematology/oncology follow-up is recommended Vital signs. See below for your results today.   Medications prescribed:  Oxycodone  - narcotic pain medication  DO NOT drive or perform any activities that require you to be awake and alert because this medicine can make you drowsy.   Take any prescribed medications only as directed.  Home care instructions:  Follow any educational materials contained in this packet. Use Zofran  sent in at your initial appointment today as needed for nausea and vomiting.  Follow-up instructions: Please follow-up with your primary care provider in the next 3 days for further evaluation of your symptoms.    I have placed a request for hematology/oncology follow-up for the swollen intra-abdominal lymph nodes noted on the CT today.  Return instructions:  SEEK IMMEDIATE MEDICAL ATTENTION IF: The pain does not go away or becomes severe  A temperature above 101F develops  Repeated vomiting occurs (multiple episodes)  The pain becomes localized to portions of the abdomen. The right side could possibly be appendicitis. In an adult, the left lower portion of the abdomen could be colitis or diverticulitis.  Blood is being passed in stools or vomit (bright red or black tarry stools)  You develop chest pain, difficulty breathing, dizziness or fainting, or become confused, poorly responsive, or  inconsolable (young children) If you have any other emergent concerns regarding your health  Additional Information: Abdominal (belly) pain can be caused by many things. Your caregiver performed an examination and possibly ordered blood/urine tests and imaging (CT scan, x-rays, ultrasound). Many cases can be observed and treated at home after initial evaluation in the emergency department. Even though you are being discharged home, abdominal pain can be unpredictable. Therefore, you need a repeated exam if your pain does not resolve, returns, or worsens. Most patients with abdominal pain don't have to be admitted to the hospital or have surgery, but serious problems like appendicitis and gallbladder attacks can start out as nonspecific pain. Many abdominal conditions cannot be diagnosed in one visit, so follow-up evaluations are very important.  Your vital signs today were: BP (!) 129/92 (BP Location: Right Arm)   Pulse 70   Temp 98.4 F (36.9 C) (Oral)   Resp 14   LMP 09/03/2023 (Approximate)   SpO2 98%  If your blood pressure (bp) was elevated above 135/85 this visit, please have this repeated by your doctor within one month. --------------

## 2023-09-18 NOTE — ED Triage Notes (Signed)
 Patient states lower abdominal pain x2 weeks. Denies v/d. Also reports continued back pain since fall at work on 8/24. Seen here for same on that day.

## 2023-09-19 ENCOUNTER — Ambulatory Visit: Payer: Self-pay | Admitting: Family Medicine

## 2023-09-19 LAB — CERVICOVAGINAL ANCILLARY ONLY
Bacterial Vaginitis (gardnerella): NEGATIVE
Candida Glabrata: NEGATIVE
Candida Vaginitis: NEGATIVE
Chlamydia: NEGATIVE
Comment: NEGATIVE
Comment: NEGATIVE
Comment: NEGATIVE
Comment: NEGATIVE
Comment: NEGATIVE
Comment: NORMAL
Neisseria Gonorrhea: NEGATIVE
Trichomonas: NEGATIVE

## 2023-09-19 LAB — URINE CULTURE
MICRO NUMBER:: 16944309
Result:: NO GROWTH
SPECIMEN QUALITY:: ADEQUATE

## 2023-09-20 ENCOUNTER — Encounter: Payer: Self-pay | Admitting: Medical Oncology

## 2023-09-21 ENCOUNTER — Other Ambulatory Visit: Payer: Self-pay

## 2023-09-21 ENCOUNTER — Ambulatory Visit (INDEPENDENT_AMBULATORY_CARE_PROVIDER_SITE_OTHER): Payer: Self-pay | Admitting: Nurse Practitioner

## 2023-09-21 ENCOUNTER — Encounter: Payer: Self-pay | Admitting: Nurse Practitioner

## 2023-09-21 VITALS — BP 122/92 | HR 106 | Resp 16 | Ht 65.0 in | Wt 330.6 lb

## 2023-09-21 DIAGNOSIS — R590 Localized enlarged lymph nodes: Secondary | ICD-10-CM

## 2023-09-21 DIAGNOSIS — K8020 Calculus of gallbladder without cholecystitis without obstruction: Secondary | ICD-10-CM

## 2023-09-21 MED ORDER — OXYCODONE-ACETAMINOPHEN 10-325 MG PO TABS
1.0000 | ORAL_TABLET | Freq: Three times a day (TID) | ORAL | 0 refills | Status: AC | PRN
  Filled 2023-09-21: qty 9, 3d supply, fill #0

## 2023-09-21 NOTE — Progress Notes (Signed)
 BP (!) 122/92   Pulse (!) 106   Resp 16   Ht 5' 5 (1.651 m)   Wt (!) 330 lb 9.6 oz (150 kg)   LMP 09/03/2023 (Approximate)   SpO2 97%   BMI 55.01 kg/m    Subjective:    Patient ID: Latoya Stark, female    DOB: Nov 19, 1995, 28 y.o.   MRN: [id number]  HPI: Latoya Stark is a 28 y.o. female  Chief Complaint  Patient presents with   Hospitalization Follow-up    Already has an appt with oncology on Monday, still hurting Lower abdomen, Oxycodone  not helping   Discussed the use of AI scribe software for clinical note transcription with the patient, who gave verbal consent to proceed.  History of Present Illness Latoya Stark is a 28 year old female who presents for follow-up after an ER visit for lower abdominal pain.  Lower abdominal pain - Persistent lower abdominal pain for two weeks, initially intermittent, now more constant over the past three to four days - Pain described as sharp and achy, sometimes severe enough to prevent sleep - Pain severity rated as 10/10 at worst, reduced to 5/10 after ER-administered medication - Current pain management includes Motrin  every four hours and oxycodone  5 mg as needed, with inadequate relief from oxycodone   Gastrointestinal symptoms - Episodes of vomiting - Constipation present - No vaginal bleeding or discharge  Emergency department evaluation (September 18, 2023) - Laboratory studies: normal white blood cell count, mild anemia (hemoglobin 11.7) - Comprehensive metabolic panel unremarkable - Lipase within normal limits - Negative pregnancy test - Urinalysis negative for infection or hematuria - CT abdomen/pelvis: mesenteric lymphadenopathy and gallstones identified  Functional and psychosocial impact - Significant life stressors related to ongoing symptoms - Missed work due to medical condition; filed for intermittent leave - Concern regarding eligibility for short-term disability - 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 female.     Patient with history of abdominal surgery including hernia repair, tumor removal --presents to the emergency department for evaluation of abdominal pain.  She reports approximately 2 weeks of lower abdominal pain, intermittent at first, becoming more persistent over the past 3 to 4 days.  She has had occasional episodes of vomiting, last episode was 3 days ago.  She has constipation at baseline.  No current vaginal bleeding or discharge.  She is urinating normally without pain.  Patient also complains of chronic migraines and lower back pain from a fall in August, which are also making her feel bad.  She did present to the emergency department after this injury on 8/24, had CT head, cervical spine and thoracic spine which were negative.  She was seen by primary care provider today, referred to the emergency department for consideration of imaging due to abdominal pain.  Patient seen and examined. History obtained directly from patient. Work-up including labs, imaging, EKG ordered in triage, if performed, were reviewed.     Labs/EKG: Independently reviewed and interpreted.  This included: CBC with normal white blood cell count, mild anemia with hemoglobin 11.7; CMP unremarkable; lipase normal; pregnancy negative; UA concentrated but without signs of infection or bleeding.   Imaging: Ordered CT  of the abdomen pelvis given 2 weeks of lower abdominal pain, worsening, unknown etiology.   Medications/Fluids: Ordered: Hydromorphone  0.5 mg IV, Zofran  0.4 mg IV, fluid bolus given concentrated urine.   Most recent vital signs reviewed and are as follows: BP (!) 135/110 (BP Location: Left Wrist)   Pulse 96    Temp 97.9 F (36.6 C)   Resp 20   LMP 09/03/2023 (Approximate)   SpO2 98%    Initial impression: Bilateral lower abdominal pain   //   Reassessment performed. Patient appears stable, headache improved with treatment, abdominal pain improved with treatment.   Imaging personally visualized and interpreted including: CT of the abdomen pelvis, agree no emergent findings, per radiology report progressive enlargement of multiple small mesenteric and retroperitoneal lymph nodes with generalized mesenteric edema.  Recommend correlation with serum CBC and hematology oncology follow-up.   Reviewed pertinent lab work and imaging with patient at bedside. Questions answered.    Most current vital signs reviewed and are as follows: BP (!) 129/92 (BP Location: Right Arm)   Pulse 70   Temp 98.4 F (36.9 C) (Oral)   Resp 14   LMP 09/03/2023 (Approximate)   SpO2 98%    Plan: Discharge to home.    Prescriptions written for: # 5 tablets oxycodone .  Zofran  was sent in for the patient earlier today.   Other home care instructions discussed: Avoidance of triggers, close monitoring of symptoms   ED return instructions discussed: The patient was urged to return to the Emergency Department immediately with worsening of current symptoms, worsening abdominal pain, persistent vomiting, blood noted in stools, fever, or any other concerns. The patient verbalized understanding.    Follow-up instructions discussed: Patient encouraged to follow-up with their PCP in 3 days.  Encouraged outpatient hematology oncology follow-up per radiology report for evaluation of progressive mesenteric and retroperitoneal lymphadenopathy.    EXAM: CT ABDOMEN AND PELVIS WITH CONTRAST   TECHNIQUE: Multidetector CT imaging of the abdomen and pelvis was performed using the standard protocol following bolus administration of intravenous contrast.   RADIATION DOSE REDUCTION: This exam was performed according to  the departmental dose-optimization program which includes automated exposure control, adjustment of the mA and/or kV according to patient size and/or use of iterative reconstruction technique.   CONTRAST:  OMNIPAQUE  IOHEXOL  300 MG/ML  SOLN   COMPARISON:  Abdominopelvic CT 11/01/2015.   FINDINGS: Lower chest: Clear lung bases. No significant pleural or pericardial effusion.   Hepatobiliary: The liver appears unremarkable as imaged in the noncontrast state. There are nitrogen gas containing gallstones. Minimal gallbladder wall calcification has decreased from previous study. No evidence of gallbladder wall thickening, surrounding inflammation or biliary ductal dilatation.   Pancreas: Unremarkable. No pancreatic ductal dilatation or surrounding inflammatory changes.   Spleen: Normal in size without focal abnormality.   Adrenals/Urinary Tract: Both adrenal glands appear normal. No evidence of urinary tract calculus, suspicious renal lesion or hydronephrosis. The bladder appears unremarkable for its degree of distention.   Stomach/Bowel: No enteric contrast administered. The stomach appears unremarkable for its degree of distension. No evidence of bowel wall thickening, distention or surrounding inflammatory change. The appendix appears normal.   Vascular/Lymphatic: Progressive enlargement of multiple small mesenteric and retroperitoneal lymph nodes, measuring up to 1.4 cm short axis anterior to the transverse duodenum on image 39/2. There is generalized mesenteric edema. No significant inguinal adenopathy. No significant vascular findings.   Reproductive: The uterus and ovaries appear unremarkable. No adnexal mass.   Other: As above, generalized  mesenteric edema. No ascites or pneumoperitoneum. Intact abdominal wall.   Musculoskeletal: No acute or significant osseous findings.   IMPRESSION: 1. Progressive enlargement of multiple small mesenteric and retroperitoneal  lymph nodes with generalized mesenteric edema. Findings are nonspecific, but could be secondary to a lymphoproliferative process. Recommend correlation with serum CBC and hematology-oncology follow-up. 2. No other acute findings or explanation for the patient's symptoms. 3. Cholelithiasis without evidence of cholecystitis or biliary ductal dilatation.     09/21/2023   10:09 AM 08/23/2023    8:51 AM 07/17/2023    8:55 AM  Depression screen PHQ 2/9  Decreased Interest 0 0 0  Down, Depressed, Hopeless 0 0 3  PHQ - 2 Score 0 0 3  Altered sleeping 0 3 3  Tired, decreased energy 0 0 2  Change in appetite 0 2 1  Feeling bad or failure about yourself  0 2 1  Trouble concentrating 0 0 0  Moving slowly or fidgety/restless 0 0 2  Suicidal thoughts 0 0 0  PHQ-9 Score 0 7 12  Difficult doing work/chores Not difficult at all Not difficult at all     Relevant past medical, surgical, family and social history reviewed and updated as indicated. Interim medical history since our last visit reviewed. Allergies and medications reviewed and updated.  Review of Systems  Ten systems reviewed and is negative except as mentioned in HPI      Objective:      BP (!) 122/92   Pulse (!) 106   Resp 16   Ht 5' 5 (1.651 m)   Wt (!) 330 lb 9.6 oz (150 kg)   LMP 09/03/2023 (Approximate)   SpO2 97%   BMI 55.01 kg/m    Wt Readings from Last 3 Encounters:  09/21/23 (!) 330 lb 9.6 oz (150 kg)  09/18/23 (!) 331 lb (150.1 kg)  09/13/23 (!) 315 lb (142.9 kg)    Physical Exam GENERAL: Alert, cooperative, well developed, no acute distress HEENT: Normocephalic, normal oropharynx, moist mucous membranes CHEST: Clear to auscultation bilaterally, No wheezes, rhonchi, or crackles CARDIOVASCULAR: Normal heart rate and rhythm, S1 and S2 normal without murmurs ABDOMEN: Soft, non-tender, non-distended, without organomegaly, Normal bowel sounds EXTREMITIES: No cyanosis or edema NEUROLOGICAL: Cranial nerves  grossly intact, Moves all extremities without gross motor or sensory deficit  Results for orders placed or performed during the hospital encounter of 09/18/23  Lipase, blood   Collection Time: 09/18/23 12:39 PM  Result Value Ref Range   Lipase 30 11 - 51 U/L  Comprehensive metabolic panel   Collection Time: 09/18/23 12:39 PM  Result Value Ref Range   Sodium 138 135 - 145 mmol/L   Potassium 3.8 3.5 - 5.1 mmol/L   Chloride 103 98 - 111 mmol/L   CO2 23 22 - 32 mmol/L   Glucose, Bld 98 70 - 99 mg/dL   BUN 13 6 - 20 mg/dL   Creatinine, Ser 9.35 0.44 - 1.00 mg/dL   Calcium  9.4 8.9 - 10.3 mg/dL   Total Protein 7.4 6.5 - 8.1 g/dL   Albumin 4.3 3.5 - 5.0 g/dL   AST 20 15 - 41 U/L   ALT 16 0 - 44 U/L   Alkaline Phosphatase 100 38 - 126 U/L   Total Bilirubin 0.2 0.0 - 1.2 mg/dL   GFR, Estimated >39 >39 mL/min   Anion gap 12 5 - 15  CBC   Collection Time: 09/18/23 12:39 PM  Result Value Ref Range   WBC 6.6 4.0 -  10.5 K/uL   RBC 4.12 3.87 - 5.11 MIL/uL   Hemoglobin 11.7 (L) 12.0 - 15.0 g/dL   HCT 64.3 (L) 63.9 - 53.9 %   MCV 86.4 80.0 - 100.0 fL   MCH 28.4 26.0 - 34.0 pg   MCHC 32.9 30.0 - 36.0 g/dL   RDW 86.7 88.4 - 84.4 %   Platelets 360 150 - 400 K/uL   nRBC 0.0 0.0 - 0.2 %  Urinalysis, Routine w reflex microscopic -Urine, Clean Catch   Collection Time: 09/18/23  1:30 PM  Result Value Ref Range   Color, Urine YELLOW YELLOW   APPearance CLEAR CLEAR   Specific Gravity, Urine 1.036 (H) 1.005 - 1.030   pH 6.5 5.0 - 8.0   Glucose, UA NEGATIVE NEGATIVE mg/dL   Hgb urine dipstick NEGATIVE NEGATIVE   Bilirubin Urine NEGATIVE NEGATIVE   Ketones, ur NEGATIVE NEGATIVE mg/dL   Protein, ur TRACE (A) NEGATIVE mg/dL   Nitrite NEGATIVE NEGATIVE   Leukocytes,Ua NEGATIVE NEGATIVE  Pregnancy, urine   Collection Time: 09/18/23  1:30 PM  Result Value Ref Range   Preg Test, Ur NEGATIVE NEGATIVE          Assessment & Plan:   Problem List Items Addressed This Visit   None Visit  Diagnoses       Mesenteric lymphadenopathy    -  Primary   Relevant Medications   oxyCODONE -acetaminophen  (PERCOCET) 10-325 MG tablet     Calculus of gallbladder without cholecystitis without obstruction            Assessment and Plan Assessment & Plan Mesenteric lymphadenopathy with chronic lower abdominal pain Chronic lower abdominal pain persisting for two weeks, initially intermittent, now more persistent over the last three to four days. CT scan showed mesenteric lymphadenopathy. Pain is severe, with episodes of vomiting and constipation. Oxycodone  5mg  dose is not providing relief. - Increase oxycodone  dosage to 10 mg for severe pain. - Follow up with hematology/oncology appointment on 09/24/2023. - Request she send a message on Monday with updates from the oncologist.  Gallstones without cholecystitis or obstruction Gallstones identified on CT scan without evidence of cholecystitis or biliary ductal dilation. No current symptoms related to gallstones. Discussed that gallstones may remain asymptomatic and not cause issues.  Mild anemia Mild anemia with hemoglobin at 11.7.        Follow up plan: Return if symptoms worsen or fail to improve.

## 2023-09-24 ENCOUNTER — Inpatient Hospital Stay: Payer: Self-pay

## 2023-09-24 ENCOUNTER — Encounter: Payer: Self-pay | Admitting: Medical Oncology

## 2023-09-24 ENCOUNTER — Inpatient Hospital Stay: Payer: Self-pay | Attending: Physician Assistant | Admitting: Physician Assistant

## 2023-09-24 VITALS — BP 129/68 | HR 90 | Temp 97.7°F | Resp 20 | Wt 333.0 lb

## 2023-09-24 DIAGNOSIS — R599 Enlarged lymph nodes, unspecified: Secondary | ICD-10-CM

## 2023-09-24 DIAGNOSIS — Z79899 Other long term (current) drug therapy: Secondary | ICD-10-CM | POA: Diagnosis not present

## 2023-09-24 DIAGNOSIS — D649 Anemia, unspecified: Secondary | ICD-10-CM | POA: Diagnosis not present

## 2023-09-24 DIAGNOSIS — Z800 Family history of malignant neoplasm of digestive organs: Secondary | ICD-10-CM | POA: Diagnosis not present

## 2023-09-24 DIAGNOSIS — Z808 Family history of malignant neoplasm of other organs or systems: Secondary | ICD-10-CM | POA: Diagnosis not present

## 2023-09-24 DIAGNOSIS — Z8042 Family history of malignant neoplasm of prostate: Secondary | ICD-10-CM | POA: Insufficient documentation

## 2023-09-24 DIAGNOSIS — Z803 Family history of malignant neoplasm of breast: Secondary | ICD-10-CM | POA: Diagnosis not present

## 2023-09-24 LAB — CMP (CANCER CENTER ONLY)
ALT: 13 U/L (ref 0–44)
AST: 13 U/L — ABNORMAL LOW (ref 15–41)
Albumin: 4.3 g/dL (ref 3.5–5.0)
Alkaline Phosphatase: 93 U/L (ref 38–126)
Anion gap: 6 (ref 5–15)
BUN: 10 mg/dL (ref 6–20)
CO2: 27 mmol/L (ref 22–32)
Calcium: 9.2 mg/dL (ref 8.9–10.3)
Chloride: 104 mmol/L (ref 98–111)
Creatinine: 0.71 mg/dL (ref 0.44–1.00)
GFR, Estimated: >60 mL/min (ref >60)
Glucose, Bld: 84 mg/dL (ref 70–99)
Potassium: 4 mmol/L (ref 3.5–5.1)
Sodium: 137 mmol/L (ref 135–145)
Total Bilirubin: 0.3 mg/dL (ref 0.0–1.2)
Total Protein: 7.5 g/dL (ref 6.5–8.1)

## 2023-09-24 LAB — CBC WITH DIFFERENTIAL (CANCER CENTER ONLY)
Abs Immature Granulocytes: 0.02 K/uL (ref 0.00–0.07)
Basophils Absolute: 0 K/uL (ref 0.0–0.1)
Basophils Relative: 0 %
Eosinophils Absolute: 0.1 K/uL (ref 0.0–0.5)
Eosinophils Relative: 2 %
HCT: 35.8 % — ABNORMAL LOW (ref 36.0–46.0)
Hemoglobin: 12 g/dL (ref 12.0–15.0)
Immature Granulocytes: 0 %
Lymphocytes Relative: 29 %
Lymphs Abs: 1.8 K/uL (ref 0.7–4.0)
MCH: 28.4 pg (ref 26.0–34.0)
MCHC: 33.5 g/dL (ref 30.0–36.0)
MCV: 84.8 fL (ref 80.0–100.0)
Monocytes Absolute: 0.5 K/uL (ref 0.1–1.0)
Monocytes Relative: 8 %
Neutro Abs: 3.9 K/uL (ref 1.7–7.7)
Neutrophils Relative %: 61 %
Platelet Count: 342 K/uL (ref 150–400)
RBC: 4.22 MIL/uL (ref 3.87–5.11)
RDW: 13 % (ref 11.5–15.5)
WBC Count: 6.4 K/uL (ref 4.0–10.5)
nRBC: 0 % (ref 0.0–0.2)

## 2023-09-24 LAB — SEDIMENTATION RATE: Sed Rate: 16 mm/h (ref 0–22)

## 2023-09-24 LAB — IRON AND IRON BINDING CAPACITY (CC-WL,HP ONLY)
Iron: 35 ug/dL (ref 28–170)
Saturation Ratios: 9 % — ABNORMAL LOW (ref 10.4–31.8)
TIBC: 389 ug/dL (ref 250–450)
UIBC: 354 ug/dL (ref 148–442)

## 2023-09-24 LAB — LACTATE DEHYDROGENASE: LDH: 142 U/L (ref 98–192)

## 2023-09-24 LAB — HEPATITIS B SURFACE ANTIGEN: Hepatitis B Surface Ag: NONREACTIVE

## 2023-09-24 LAB — HEPATITIS B SURFACE ANTIBODY,QUALITATIVE: Hep B S Ab: NONREACTIVE

## 2023-09-24 LAB — FERRITIN: Ferritin: 27 ng/mL (ref 11–307)

## 2023-09-24 LAB — HEPATITIS C ANTIBODY: HCV Ab: NONREACTIVE

## 2023-09-24 LAB — C-REACTIVE PROTEIN: CRP: 3 mg/dL — ABNORMAL HIGH (ref <1.0)

## 2023-09-24 LAB — HIV ANTIBODY (ROUTINE TESTING W REFLEX): HIV Screen 4th Generation wRfx: NONREACTIVE

## 2023-09-24 NOTE — Progress Notes (Signed)
 [facility]  Patient presented to clinic, with her step mother, for her scheduled appointment with PA-C Mallie Combes. I introduced myself and provided them with my direct contact information. Patient and mom were both encouraged to call me with any questions/concerns they may have.  Latoya KYM Raider, RN, BSN, [facility] Oncology Nurse Navigator, [facility] 09/24/2023 2:28 PM

## 2023-09-24 NOTE — Progress Notes (Signed)
 [facility] [phone]   [phone]  INITIAL CONSULTATION:  Patient Care Team: Gareth Mliss FALCON, FNP as PCP - General (Nurse Practitioner) Herchel Gloris LABOR, MD as PCP - OBGYN (Obstetrics and Gynecology) Golden Forestine BROCKS, RN as Oncology Nurse Navigator (Medical Oncology)  CHIEF COMPLAINTS/PURPOSE OF CONSULTATION:  lymphadenoapthy   HISTORY OF PRESENTING ILLNESS:  Latoya Stark 28 y.o. female with medical history significant for chronic migraines, mild persistent asthma, dysphagia, lipoma of neck, seizure, conversion disorder, anemia, major depressive disorder, anxiety, obesity, chronic constipation.  On review of the previous records patient is being referred from EDP Josh Geiple PA-C.  Patient evaluated on 09/18/2023 for bilateral lower abdominal pain for 2 weeks.  CT AP showing progressive enlargement of multiple small mesenteric and retroperitoneal lymph nodes measuring up to 1.4 cm short axis with generalized mesenteric edema.  Radiologist comments that findings are nonspecific, could be secondary to lymphoproliferative process.  CMP and lipase were unremarkable.  CBC showing anemia with hemoglobin of 11.7, otherwise unremarkable.  Workup also included cervicovaginal swab that was negative for STI. Pregnancy test was also negative.  Further chart review shows that CTAP from 09/18/2023 was compared to a scan previously done on 11/01/2015.  Findings from the CT AP in 2017 showing scattered borderline mesenteric and retroperitoneal lymph nodes suggesting mesenteric adenitis.  On exam today patient is accompanied by her step mother who provides additional history. She has been experiencing persistent abdominal pain for the past two weeks, with intermittent episodes over the past six months. The pain is constant, unbearable, and affects her sleep, causing her to cry. It is located across her lower abdomen, sometimes sharp around the  umbilicus, and does not change with eating or activity. She has tried Miralax  and prune juice without relief. The pain is sometimes accompanied by nausea and vomiting, though currently only nausea persists. She has been using Zofran  and oxycodone  for symptom management, with limited relief. She elaborates on her history of chronic constipation, diagnosed at age 15, and has recently been under GI care since 2024. She has tried various treatments including Trulance , Linzess , and Miralax , with minimal success. Her bowel movements are infrequent, occurring once a month, and are small and hard. No fever, no urinary symptoms, nausea present, vomiting previously present but not currently, no recent respiratory or GI illness, no frequent UTIs.  Family history is significant for cancer, with her mother having bone cancer, her maternal grandmother having colon and breast cancer, and her paternal grandfather having prostate cancer. She adds that her brother has lupus. She does not smoke and drinks alcohol occasionally. 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   Patient had colonoscopy on 05/22/2023 to evaluate chronic idiopathic constipation with reported findings of entire examined colon appeared normal on direct and retroflexion views.  No specimens were collected.  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   started at age 36   Headache     Obesity    Pneumonia    Postpartum depression, postpartum condition 08/11/2021   Seizures (HCC)    when I get too hot   Trichomonas infection    Umbilical hernia 04/2022   UTI (lower urinary tract infection)    Vulvar abscess 08/11/2021    SURGICAL HISTORY: Past Surgical History:  Procedure Laterality Date   COLONOSCOPY N/A 05/22/2023   Procedure: COLONOSCOPY;  Surgeon: Therisa Bi, MD;  Location: [facility] ENDOSCOPY;  Service: Gastroenterology;  Laterality: N/A;   DILATION AND EVACUATION N/A 08/09/2013   Procedure: DILATATION AND EVACUATION;  Surgeon: Aida DELENA Na, MD;  Location: [facility] ORS;  Service: Gynecology;  Laterality: N/A;   DILATION AND EVACUATION  2017   ESOPHAGOGASTRODUODENOSCOPY (EGD) WITH PROPOFOL  N/A 11/10/2022   Procedure: ESOPHAGOGASTRODUODENOSCOPY (EGD) WITH PROPOFOL ;  Surgeon: Therisa Bi, MD;  Location: [facility] ENDOSCOPY;  Service: Gastroenterology;  Laterality: N/A;   EUSTACHIAN TUBE DILATION     EXCISION MASS NECK N/A 11/16/2022   Procedure: EXCISION MASS NECK;  Surgeon: Jordis Laneta FALCON, MD;  Location: [facility] ORS;  Service: General;  Laterality: N/A;  JP drain placed   LACERATION REPAIR Left 2007   5th toe   ORTHOPEDIC SURGERY Left 2010   hand fracture; screws in place   TONSILLECTOMY AND ADENOIDECTOMY  2001   TYMPANOSTOMY TUBE PLACEMENT Bilateral 2002   UMBILICAL HERNIA REPAIR N/A 05/12/2022   Procedure: LAPAROSCOPIC ASSISTED SUPRA UMBILICAL HERNIA REPAIR;  Surgeon: Gladis Cough, MD;  Location: [facility] ORS;  Service: General;  Laterality: N/A;    SOCIAL HISTORY: Social History   Socioeconomic History   Marital status: Significant Other    Spouse name: Jermaine   Number of children: 2   Years of education: Not on file   Highest education level: Associate degree: academic program  Occupational History   Not on file  Tobacco Use   Smoking status: Never    Passive exposure: Never   Smokeless tobacco: Never  Vaping Use   Vaping status: Never Used   Substance and Sexual Activity   Alcohol use: Yes    Comment: occassional   Drug use: No   Sexual activity: Not Currently    Partners: Male  Other Topics Concern   Not on file  Social History Narrative   Caffeine : tea sometimes   Lives with fiance   Right handed    Social Drivers of Health   Financial Resource Strain: Low Risk  (11/24/2022)   Overall Financial Resource Strain (CARDIA)    Difficulty of Paying Living Expenses: Not hard at all  Food Insecurity: No Food Insecurity (11/24/2022)   Hunger Vital Sign    Worried About Running Out of Food in the Last Year: Never true    Ran Out of Food in the Last Year: Never true  Transportation Needs: No Transportation Needs (11/24/2022)   PRAPARE - Administrator, Civil Service (Medical): No    Lack of Transportation (Non-Medical): No  Physical Activity: Insufficiently Active (11/24/2022)   Exercise Vital Sign    Days of Exercise per Week: 5 days    Minutes of Exercise per Session: 20 min  Stress: No Stress Concern Present (11/24/2022)   [employer] of Occupational Health - Occupational Stress Questionnaire    Feeling of Stress : Not at all  Social Connections: Unknown (11/24/2022)   Social Connection and Isolation Panel    Frequency of  Communication with Friends and Family: Patient declined    Frequency of Social Gatherings with Friends and Family: Patient declined    Attends Religious Services: Patient declined    Database administrator or Organizations: No    Attends Engineer, structural: Not on file    Marital Status: Never married  Intimate Partner Violence: Not At Risk (08/14/2018)   Humiliation, Afraid, Rape, and Kick questionnaire    Fear of Current or Ex-Partner: No    Emotionally Abused: No    Physically Abused: No    Sexually Abused: No    FAMILY HISTORY: Family History  Problem Relation Age of Onset   Depression Mother    Heart disease Mother    Cancer Mother        Bone cancer    Seizures Mother    Stroke Mother    Sickle cell anemia Mother    Thyroid  disease Mother    Heart disease Father    Hypertension Father    Seizures Maternal Aunt    Diabetes Maternal Grandmother    Colon cancer Maternal Grandmother    Hypertension Maternal Grandmother    Clotting disorder Maternal Grandmother    Breast cancer Maternal Grandmother    Stroke Maternal Grandmother    Diabetes Paternal Grandmother     ALLERGIES:  is allergic to hydrocodone , milk-related compounds, mushroom extract complex (obsolete), penicillins, cheese, latex, and sulfa antibiotics.  MEDICATIONS:  Current Outpatient Medications  Medication Sig Dispense Refill   albuterol  (VENTOLIN  HFA) 108 (90 Base) MCG/ACT inhaler Inhale 1-2 puffs into the lungs every 6 (six) hours as needed for wheezing or shortness of breath. 18 g 11   busPIRone  (BUSPAR ) 5 MG tablet Take 1 tablet (5 mg total) by mouth 3 (three) times daily. 90 tablet 0   cetirizine  (ZYRTEC ) 10 MG tablet Take 1 tablet (10 mg total) by mouth daily. 30 tablet 0   clindamycin  (CLEOCIN ) 300 MG capsule Take 300 mg by mouth every 6 (six) hours.     cyclobenzaprine  (FLEXERIL ) 10 MG tablet Take 0.5-1 tablets (5-10 mg total) by mouth 3 (three) times daily as needed. 30 tablet 0   EPINEPHrine  (EPIPEN  2-PAK) 0.3 mg/0.3 mL IJ SOAJ injection Inject 0.3 mg into the muscle once as needed (for severe allergic reaction). CAll 911 immediately if you have to use this medicine 2 each 1   gabapentin  (NEURONTIN ) 100 MG capsule Take 2 capsules (200 mg total) by mouth at bedtime. 180 capsule 0   hydrOXYzine  (VISTARIL ) 25 MG capsule TAKE 1 CAPSULE (25 MG TOTAL) BY MOUTH AT BEDTIME AS NEEDED FOR ANXIETY. 90 capsule 0   ibuprofen  (IBU) 800 MG tablet Take 1 tablet (800 mg total) by mouth every 8 (eight) hours as needed for moderate pain (pain score 4-6). 90 tablet 1   lamoTRIgine  (LAMICTAL ) 200 MG tablet Take 1 tablet (200 mg total) by mouth 3 (three) times daily. 90 tablet 0    levETIRAcetam  (KEPPRA ) 750 MG tablet Take 2 tablets (1,500 mg total) by mouth 2 (two) times daily. 120 tablet 0   meloxicam  (MOBIC ) 15 MG tablet Take 1 tablet (15 mg total) by mouth daily as needed for pain. 30 tablet 0   meloxicam  (MOBIC ) 7.5 MG tablet Take 1 tablet (7.5 mg total) by mouth daily. 30 tablet 0   methylPREDNISolone  (MEDROL  DOSEPAK) 4 MG TBPK tablet Take as directed 21 tablet 0   ondansetron  (ZOFRAN -ODT) 4 MG disintegrating tablet Take 1-2 tablets (4-8 mg total) by mouth every 8 (eight)  hours as needed for nausea or vomiting. 20 tablet 0   oxyCODONE -acetaminophen  (PERCOCET) 10-325 MG tablet Take 1 tablet by mouth every 8 (eight) hours as needed for up to 3 days for pain. 9 tablet 0   sertraline  (ZOLOFT ) 100 MG tablet Take 1 tablet (100 mg total) by mouth daily. 90 tablet 1   SYMBICORT  80-4.5 MCG/ACT inhaler Inhale 2 puffs into the lungs 2 (two) times daily as needed (shortness of breath). 10.2 g 10   tirzepatide  (ZEPBOUND ) 5 MG/0.5ML Pen Inject 5 mg into the skin once a week. 2 mL 0   No current facility-administered medications for this visit.    REVIEW OF SYSTEMS:   All other systems are reviewed and are negative for acute change except as noted in the HPI.  PHYSICAL EXAMINATION: ECOG PERFORMANCE STATUS: 1 - Symptomatic but completely ambulatory  Vitals:   09/24/23 1250  BP: 129/68  Pulse: 90  Resp: 20  Temp: 97.7 F (36.5 C)  SpO2: 97%   Filed Weights   09/24/23 1250  Weight: (!) 333 lb (151 kg)    Physical Exam Vitals reviewed.  Constitutional:      Appearance: She is not ill-appearing or toxic-appearing.  HENT:     Head: Normocephalic.     Right Ear: External ear normal.     Left Ear: External ear normal.     Nose: Nose normal.  Eyes:     General: No scleral icterus.    Conjunctiva/sclera: Conjunctivae normal.  Cardiovascular:     Rate and Rhythm: Normal rate and regular rhythm.     Pulses: Normal pulses.     Heart sounds: Normal heart sounds.   Pulmonary:     Effort: Pulmonary effort is normal.     Breath sounds: Normal breath sounds.  Abdominal:     General: Bowel sounds are normal. There is no distension.     Palpations: Abdomen is soft.     Tenderness: There is no guarding or rebound.     Comments: Diffuse abdominal tenderness. No peritoneal signs  Musculoskeletal:        General: Normal range of motion.     Cervical back: Normal range of motion.  Skin:    General: Skin is warm and dry.  Neurological:     Mental Status: She is alert and oriented to person, place, and time.  Psychiatric:        Mood and Affect: Mood normal.       LABORATORY DATA:  I have reviewed the data as listed    Latest Ref Rng & Units 09/18/2023   12:39 PM 08/21/2023   10:46 AM 08/10/2023    4:34 PM  CBC  WBC 4.0 - 10.5 K/uL 6.6  6.0  8.2   Hemoglobin 12.0 - 15.0 g/dL 88.2  87.5  86.8   Hematocrit 36.0 - 46.0 % 35.6  39.4  40.9   Platelets 150 - 400 K/uL 360  379  371        Latest Ref Rng & Units 09/18/2023   12:39 PM 08/21/2023   10:46 AM 08/10/2023    4:34 PM  CMP  Glucose 70 - 99 mg/dL 98  97  73   BUN 6 - 20 mg/dL 13  9  14    Creatinine 0.44 - 1.00 mg/dL 9.35  9.29  9.20   Sodium 135 - 145 mmol/L 138  140  140   Potassium 3.5 - 5.1 mmol/L 3.8  4.2  3.8   Chloride 98 -  111 mmol/L 103  103  103   CO2 22 - 32 mmol/L 23  22  25    Calcium  8.9 - 10.3 mg/dL 9.4  9.5  9.2   Total Protein 6.5 - 8.1 g/dL 7.4  6.7    Total Bilirubin 0.0 - 1.2 mg/dL 0.2  0.2    Alkaline Phos 38 - 126 U/L 100  101    AST 15 - 41 U/L 20  15    ALT 0 - 44 U/L 16  17       RADIOGRAPHIC STUDIES: I have personally reviewed the radiological images as listed and agreed with the findings in the report. CT ABDOMEN PELVIS W CONTRAST Result Date: 09/18/2023 CLINICAL DATA:  Bilateral lower abdominal pain for 2 weeks. Back pain since falling at work more than 1 year ago. EXAM: CT ABDOMEN AND PELVIS WITH CONTRAST TECHNIQUE: Multidetector CT imaging of the abdomen and  pelvis was performed using the standard protocol following bolus administration of intravenous contrast. RADIATION DOSE REDUCTION: This exam was performed according to the departmental dose-optimization program which includes automated exposure control, adjustment of the mA and/or kV according to patient size and/or use of iterative reconstruction technique. CONTRAST:  OMNIPAQUE  IOHEXOL  300 MG/ML  SOLN COMPARISON:  Abdominopelvic CT 11/01/2015. FINDINGS: Lower chest: Clear lung bases. No significant pleural or pericardial effusion. Hepatobiliary: The liver appears unremarkable as imaged in the noncontrast state. There are nitrogen gas containing gallstones. Minimal gallbladder wall calcification has decreased from previous study. No evidence of gallbladder wall thickening, surrounding inflammation or biliary ductal dilatation. Pancreas: Unremarkable. No pancreatic ductal dilatation or surrounding inflammatory changes. Spleen: Normal in size without focal abnormality. Adrenals/Urinary Tract: Both adrenal glands appear normal. No evidence of urinary tract calculus, suspicious renal lesion or hydronephrosis. The bladder appears unremarkable for its degree of distention. Stomach/Bowel: No enteric contrast administered. The stomach appears unremarkable for its degree of distension. No evidence of bowel wall thickening, distention or surrounding inflammatory change. The appendix appears normal. Vascular/Lymphatic: Progressive enlargement of multiple small mesenteric and retroperitoneal lymph nodes, measuring up to 1.4 cm short axis anterior to the transverse duodenum on image 39/2. There is generalized mesenteric edema. No significant inguinal adenopathy. No significant vascular findings. Reproductive: The uterus and ovaries appear unremarkable. No adnexal mass. Other: As above, generalized mesenteric edema. No ascites or pneumoperitoneum. Intact abdominal wall. Musculoskeletal: No acute or significant osseous  findings. IMPRESSION: 1. Progressive enlargement of multiple small mesenteric and retroperitoneal lymph nodes with generalized mesenteric edema. Findings are nonspecific, but could be secondary to a lymphoproliferative process. Recommend correlation with serum CBC and hematology-oncology follow-up. 2. No other acute findings or explanation for the patient's symptoms. 3. Cholelithiasis without evidence of cholecystitis or biliary ductal dilatation. Electronically Signed   By: Elsie Perone M.D.   On: 09/18/2023 15:29   CT Thoracic Spine Wo Contrast Result Date: 09/02/2023 CLINICAL DATA:  Slipped on fell on wet floor. Blunt trauma. Thoracic back pain. EXAM: CT THORACIC SPINE WITHOUT CONTRAST TECHNIQUE: Multidetector CT images of the thoracic were obtained using the standard protocol without intravenous contrast. RADIATION DOSE REDUCTION: This exam was performed according to the departmental dose-optimization program which includes automated exposure control, adjustment of the mA and/or kV according to patient size and/or use of iterative reconstruction technique. COMPARISON:  None Available. FINDINGS: Alignment: Normal. Vertebrae: No acute fracture or other focal bone lesions. Paraspinal and other soft tissues: Unremarkable. Disc levels: No evidence of disc space narrowing. IMPRESSION: Negative. No thoracic spine fracture or subluxation.  Electronically Signed   By: Norleen DELENA Kil M.D.   On: 09/02/2023 14:51   CT Cervical Spine Wo Contrast Result Date: 09/02/2023 CLINICAL DATA:  Slipped and fell on wet floor. Blunt trauma. Neck pain. EXAM: CT CERVICAL SPINE WITHOUT CONTRAST TECHNIQUE: Multidetector CT imaging of the cervical spine was performed without intravenous contrast. Multiplanar CT image reconstructions were also generated. RADIATION DOSE REDUCTION: This exam was performed according to the departmental dose-optimization program which includes automated exposure control, adjustment of the mA and/or kV  according to patient size and/or use of iterative reconstruction technique. COMPARISON:  None Available. FINDINGS: Alignment: Normal. Skull base and vertebrae: No acute fracture. No primary bone lesion or focal pathologic process. Soft tissues and spinal canal: No prevertebral fluid or swelling. No visible canal hematoma. Disc levels: No disc space narrowing. Upper chest: No acute findings. Other: None. IMPRESSION: Negative cervical spine CT. Electronically Signed   By: Norleen DELENA Kil M.D.   On: 09/02/2023 14:48   CT Head Wo Contrast Result Date: 09/02/2023 CLINICAL DATA:  Slipped and fell on wet floor. Blunt poly trauma. Headache. EXAM: CT HEAD WITHOUT CONTRAST TECHNIQUE: Contiguous axial images were obtained from the base of the skull through the vertex without intravenous contrast. RADIATION DOSE REDUCTION: This exam was performed according to the departmental dose-optimization program which includes automated exposure control, adjustment of the mA and/or kV according to patient size and/or use of iterative reconstruction technique. COMPARISON:  08/10/2023 FINDINGS: Brain: No evidence of intracranial hemorrhage, acute infarction, hydrocephalus, extra-axial collection, or mass lesion/mass effect. Vascular:  No hyperdense vessel or other acute findings. Skull: No evidence of fracture or other significant bone abnormality. Sinuses/Orbits:  No acute findings. Other: None. IMPRESSION: Negative noncontrast head CT. Electronically Signed   By: Norleen DELENA Kil M.D.   On: 09/02/2023 14:46    ASSESSMENT & PLAN Latoya Stark is a 28 y.o. female presenting [facility] for consultation regarding lymphadenopathy. Patient will proceed with laboratory workup today. Differentials include infectious process, inflammatory process, lymphoproliferative disorder or metastatic disease.  #Enlargement of multiple small mesenteric and retroperitoneal lymph nodes  - Reviewed CT AP results with patient and step  mother. - Will order PET CT for further evaluation. If FDG avid would consider biopsy. IF PET and labs are negative will recommend GI evaluation. - Labs today to check CBC, CMP, LDH, flow cytometry, ESR and CRP levels, hepatitis B and C serologies, HIV.   #Anemia - Will check ferritin and iron panel today given her history of anemia.  #Age related screenings - UTD  -Patient will RTC when work up is complete.  Patient expressed understanding of the recommended workup and is agreeable to move forward.   All questions were answered. The patient knows to call the clinic with any problems, questions or concerns.  Shared visit with Dr. Federico.  Orders Placed This Encounter  Procedures   NM PET Image Initial (PI) Skull Base To Thigh    [facility] pt    Standing Status:   Future    Expected Date:   09/25/2023    Expiration Date:   09/23/2024    If indicated for the ordered procedure, I authorize the administration of a radiopharmaceutical per Radiology protocol:   Yes    Is the patient pregnant?:   No    Preferred imaging location?:   [facility]   CBC with Differential (Cancer Center Only)   CMP (Cancer Center only)   C-reactive protein   Flow Cytometry,  Peripheral Blood (Oncology)   Hepatitis B core antibody, total   Hepatitis B surface antibody,qualitative   Hepatitis B surface antigen   Hepatitis C antibody   HIV Antibody (routine testing w rflx)   Lactate dehydrogenase   Sedimentation rate   Ferritin    Standing Status:   Future    Number of Occurrences:   1    Expiration Date:   09/23/2024   Iron and Iron Binding Capacity ([facility] only)    Standing Status:   Future    Number of Occurrences:   1    Expected Date:   09/24/2023    Expiration Date:   09/23/2024      I have spent a total of 60 minutes minutes of face-to-face and non-face-to-face time, preparing to see the patient, obtaining and/or reviewing separately obtained history, performing a medically  appropriate examination, counseling and educating the patient, ordering medications/tests/procedures, referring and communicating with other health care professionals, documenting clinical information in the electronic health record, independently interpreting results and communicating results to the patient, and care coordination.   Theodore Rahrig Walisiewicz PA-C Department of Hematology/Oncology [facility] at [facility] Phone: [phone]  I have read the above note and personally examined the patient. I agree with the assessment and plan as noted above.  Briefly Latoya Stark is a 28 year old female who presents for evaluation of mesenteric lymph nodes.  She currently works as a Scientist, physiological at [employer].  Over the last 47-month she has had issues with lower abdominal pain.  She has had issues of feeling hot but no frank heavy night sweats.  She reports that at a 68 degrees at home she is surprised how warm she feels.  She has not had any unexpected weight loss and denies any nausea, vomiting, or diarrhea.  At this time I would recommend a PET CT scan ordered to further evaluate these lymph nodes.  If they are continuing to be active on PET would recommend consideration of excisional biopsy/core guided biopsy.  The patient voiced understanding of our findings and plan moving forward.   Norleen IVAR Kidney, MD Department of Hematology/Oncology [facility] at [facility] Phone: [phone] Pager: [phone] Email: [email or web address]

## 2023-09-24 NOTE — Patient Instructions (Signed)
 Diagnostic Clinic Office Visit Discharge Information and Instructions  Thank you for choosing [facility] for your healthcare needs.  Below is a summary of today's discussion, along with our contact information and an outline of what to expect next.  Reason for Visit:  enlarged lymph nodes  Proposed Diagnostic Care Plan: Labs collected today PET scan ordered. Once approved by your insurance a scheduler will contact you to schedule the scan.    What to Expect: - Generally, when lab tests are ordered the results can take up to 1 week for results to be available.  At that point, we will contact you to discuss your results with you.  Unless there is a critical result, we will typically wait for all of your lab results to be available before contacting you. - If a biopsy is part of your Care Plan, those results can take on average 7-10 days to result.  Once results are available, we will contact you to discuss your pathology results and any next steps. - If you have additional imaging ordered, such as a CT Scan, MRI, Ultrasound, Bone Scan, or PET scan, your imaging will need to be authorized then scheduled with the earliest available appointment.  You may be asked to travel to another hospital within [facility] who has a sooner availability, please consider doing so if asked. - If you use MyChart, your results will be available to you in the MyChart portal.  Your provider will be in touch with you as soon as all of your results are available to be discussed.  Your Diagnostic Clinic Provider:  Orlandis Sanden Walisiewicz PA-C and Dr. Federico Your Diagnostic Navigator:  Colene Raider RN, office number [phone]  If you or your caregiver have number blocking on your cell phones, please ensure the cancer center's numbers are not blocked.  If you are not a registered MyChart user, please consider enrolling in MyChart to receive your test results and visit notes.  You can also access your discharge  instructions electronically.  MyChart also gives you an electronic means to communicate with your Care Team instead of needing to call in to the cancer center.  We appreciate you trusting us  with your healthcare and look forward to partnering with you as we work to uncover what your potential diagnosis may be.  Please do not hesitate to reach out at any point with questions or concerns.

## 2023-09-25 ENCOUNTER — Encounter (HOSPITAL_COMMUNITY)
Admission: RE | Admit: 2023-09-25 | Discharge: 2023-09-25 | Disposition: A | Discharge status: Home / Self Care | Payer: Self-pay | Source: Ambulatory Visit | Attending: Physician Assistant | Admitting: Physician Assistant

## 2023-09-25 DIAGNOSIS — R599 Enlarged lymph nodes, unspecified: Secondary | ICD-10-CM | POA: Diagnosis present

## 2023-09-25 LAB — GLUCOSE, CAPILLARY: Glucose-Capillary: 86 mg/dL (ref 70–99)

## 2023-09-25 LAB — HEPATITIS B CORE ANTIBODY, TOTAL: HEP B CORE AB: NEGATIVE

## 2023-09-25 MED ORDER — FLUDEOXYGLUCOSE F - 18 (FDG) INJECTION
15.9700 | Freq: Once | INTRAVENOUS | Status: AC
Start: 2023-09-25 — End: 2023-09-25
  Administered 2023-09-25: 15.97 via INTRAVENOUS

## 2023-09-26 ENCOUNTER — Encounter: Payer: Self-pay | Admitting: Medical Oncology

## 2023-09-26 ENCOUNTER — Other Ambulatory Visit: Payer: Self-pay | Admitting: Physician Assistant

## 2023-09-26 ENCOUNTER — Inpatient Hospital Stay: Payer: Self-pay

## 2023-09-26 DIAGNOSIS — R599 Enlarged lymph nodes, unspecified: Secondary | ICD-10-CM

## 2023-09-26 LAB — SURGICAL PATHOLOGY

## 2023-09-26 MED ORDER — OXYCODONE-ACETAMINOPHEN 10-325 MG PO TABS: 1.0000 | ORAL_TABLET | Freq: Three times a day (TID) | ORAL | 0 refills | Status: DC | PRN

## 2023-09-26 NOTE — Progress Notes (Signed)
 [facility]  Patient LVM states having increased pain and requesting pain medication.   Call back to patient regarding vm left with me. Patient states pain is an 8/10 in the abdomen. Patient states she ran out of her oxycodone  prescription and requesting refill to be sent to [facility] on [address]. PA-C Mallie informed.   Per Mallie, patient informed that all the labs have not resulted and once they have Mallie will call to discuss results. Patient also informed, per Mallie, that PET scan did not provide us  with the definitive answers that we were hoping to have and that she would like to draw additional labs, GI tumor markers. I explained to patient what tumor markers are. Patient gave her verbal understanding, stated she was available today. Appointment scheduled for her this afternoon at 3:45 PM. Patient also informed that oxycodone  refill sent to her requested pharmacy. Patient expressed thanks, denies further questions at this time. Patient encouraged to call with questions/concerns.   Latoya KYM Raider, RN, BSN, [facility] Nurse Navigator, [facility] 09/26/2023 12:38 PM

## 2023-09-27 LAB — FLOW CYTOMETRY

## 2023-09-27 LAB — CEA (ACCESS): CEA (CHCC): <1 ng/mL (ref 0.00–5.00)

## 2023-09-28 ENCOUNTER — Encounter: Payer: Self-pay | Admitting: Medical Oncology

## 2023-09-28 ENCOUNTER — Telehealth: Payer: Self-pay | Admitting: Physician Assistant

## 2023-09-28 LAB — CHROMOGRANIN A: Chromogranin A (ng/mL): 33.6 ng/mL (ref 0.0–101.8)

## 2023-09-28 NOTE — Telephone Encounter (Signed)
 I notified Latoya Stark by phone regarding lab and PET scan results. Labs show patient is not immune to hepatitis B, otherwise no significant findings. She is aware she will need to repeat the series with PCP after [facility] work up is complete. PET scan shows nonspecific findings of hypermetabolic central mesenteric lymphadenopathy and porta hepatis lymphadenopathy. I have consulted our general surgery team to review for potential excisional biopsy vs recommended next steps. Patient aware I will update her with additional recommendations. I will write her out for an additional week of work as she continues to have abdominal pain and work up is still ongoing. All of patient's questions were answered and she expressed understanding of the plan provided.

## 2023-09-30 ENCOUNTER — Emergency Department (HOSPITAL_BASED_OUTPATIENT_CLINIC_OR_DEPARTMENT_OTHER): Payer: Self-pay

## 2023-09-30 ENCOUNTER — Emergency Department (HOSPITAL_BASED_OUTPATIENT_CLINIC_OR_DEPARTMENT_OTHER)
Admission: EM | Admit: 2023-09-30 | Discharge: 2023-09-30 | Disposition: A | Discharge status: Home / Self Care | Payer: Self-pay

## 2023-09-30 DIAGNOSIS — D649 Anemia, unspecified: Secondary | ICD-10-CM | POA: Insufficient documentation

## 2023-09-30 DIAGNOSIS — Z3201 Encounter for pregnancy test, result positive: Secondary | ICD-10-CM

## 2023-09-30 DIAGNOSIS — R1030 Lower abdominal pain, unspecified: Secondary | ICD-10-CM | POA: Diagnosis present

## 2023-09-30 DIAGNOSIS — Z91040 Latex allergy status: Secondary | ICD-10-CM | POA: Insufficient documentation

## 2023-09-30 LAB — COMPREHENSIVE METABOLIC PANEL WITH GFR
ALT: 18 U/L (ref 0–44)
AST: 20 U/L (ref 15–41)
Albumin: 4.3 g/dL (ref 3.5–5.0)
Alkaline Phosphatase: 99 U/L (ref 38–126)
Anion gap: 10 (ref 5–15)
BUN: 7 mg/dL (ref 6–20)
CO2: 27 mmol/L (ref 22–32)
Calcium: 9.2 mg/dL (ref 8.9–10.3)
Chloride: 103 mmol/L (ref 98–111)
Creatinine, Ser: 0.71 mg/dL (ref 0.44–1.00)
GFR, Estimated: >60 mL/min (ref >60)
Glucose, Bld: 97 mg/dL (ref 70–99)
Potassium: 4 mmol/L (ref 3.5–5.1)
Sodium: 140 mmol/L (ref 135–145)
Total Bilirubin: 0.3 mg/dL (ref 0.0–1.2)
Total Protein: 7.1 g/dL (ref 6.5–8.1)

## 2023-09-30 LAB — URINALYSIS, ROUTINE W REFLEX MICROSCOPIC
Bilirubin Urine: NEGATIVE
Glucose, UA: NEGATIVE mg/dL
Hgb urine dipstick: NEGATIVE
Ketones, ur: NEGATIVE mg/dL
Leukocytes,Ua: NEGATIVE
Nitrite: NEGATIVE
Protein, ur: 30 mg/dL — AB
Specific Gravity, Urine: 1.031 — ABNORMAL HIGH (ref 1.005–1.030)
pH: 7 (ref 5.0–8.0)

## 2023-09-30 LAB — CBC
HCT: 35.9 % — ABNORMAL LOW (ref 36.0–46.0)
Hemoglobin: 11.7 g/dL — ABNORMAL LOW (ref 12.0–15.0)
MCH: 28.3 pg (ref 26.0–34.0)
MCHC: 32.6 g/dL (ref 30.0–36.0)
MCV: 86.7 fL (ref 80.0–100.0)
Platelets: 337 K/uL (ref 150–400)
RBC: 4.14 MIL/uL (ref 3.87–5.11)
RDW: 12.9 % (ref 11.5–15.5)
WBC: 6.1 K/uL (ref 4.0–10.5)
nRBC: 0 % (ref 0.0–0.2)

## 2023-09-30 LAB — HCG, QUANTITATIVE, PREGNANCY: hCG, Beta Chain, Quant, S: 183 m[IU]/mL — ABNORMAL HIGH (ref <5)

## 2023-09-30 LAB — LIPASE, BLOOD: Lipase: 22 U/L (ref 11–51)

## 2023-09-30 MED ORDER — KETOROLAC TROMETHAMINE 15 MG/ML IJ SOLN
15.0000 mg | Freq: Once | INTRAMUSCULAR | Status: AC
  Administered 2023-09-30: 15 mg via INTRAVENOUS
  Filled 2023-09-30: qty 1

## 2023-09-30 MED ORDER — DOXYLAMINE-PYRIDOXINE 10-10 MG PO TBEC: 1.0000 | DELAYED_RELEASE_TABLET | ORAL | 0 refills | Status: DC

## 2023-09-30 MED ORDER — SODIUM CHLORIDE 0.9 % IV BOLUS
1000.0000 mL | Freq: Once | INTRAVENOUS | Status: AC
  Administered 2023-09-30: 1000 mL via INTRAVENOUS

## 2023-09-30 MED ORDER — MORPHINE SULFATE (PF) 4 MG/ML IV SOLN
4.0000 mg | Freq: Once | INTRAVENOUS | Status: AC
  Administered 2023-09-30: 4 mg via INTRAVENOUS
  Filled 2023-09-30: qty 1

## 2023-09-30 MED ORDER — ONDANSETRON HCL 4 MG/2ML IJ SOLN
4.0000 mg | Freq: Once | INTRAMUSCULAR | Status: AC
  Administered 2023-09-30: 4 mg via INTRAVENOUS
  Filled 2023-09-30: qty 2

## 2023-09-30 NOTE — ED Provider Notes (Signed)
 [place] EMERGENCY DEPARTMENT AT [facility] Provider Note   CSN: [id number] Arrival date & time: 09/30/23  1402     Patient presents with: Abdominal Pain   Latoya Stark is a 28 y.o. female.    Abdominal Pain   28 year old female presents emergency department with complaints of continued lower abdominal pain.  Was seen in 09/18/2023 and had CT imaging concerning for mesenteric lymphadenopathy.  Subsequently had a PET scan which showed hypermetabolic central mesenteric lymphadenopathy.  Patient states that she has had continued lower abdominal discomfort ever since being seen.  States that she was prescribed oxycodone  after leaving the ED on 09/18/2023 and had subsequent refill by oncologist that she never picked up from the pharmacy.  States that it was working when she was taking it.  Denies any fevers, chills, vomiting but has felt nauseous.  Denies any urinary symptoms, change in bowel habits.  Patient states that her last menstrual cycle ended the first week of this month and is not concerned about pregnancy.  Past medical history significant for ADHD, GERD, epilepsy, depression, chronic back pain, chronic migraine, conversion disorder  Prior to Admission medications   Medication Sig Start Date End Date Taking? Authorizing Provider  albuterol  (VENTOLIN  HFA) 108 (90 Base) MCG/ACT inhaler Inhale 1-2 puffs into the lungs every 6 (six) hours as needed for wheezing or shortness of breath. 08/23/23   Pender, [name] F, FNP  busPIRone  (BUSPAR ) 5 MG tablet Take 1 tablet (5 mg total) by mouth 3 (three) times daily. 08/20/23   Gareth Mliss FALCON, FNP  cetirizine  (ZYRTEC ) 10 MG tablet Take 1 tablet (10 mg total) by mouth daily. 08/08/23   Pender, [name] F, FNP  clindamycin  (CLEOCIN ) 300 MG capsule Take 300 mg by mouth every 6 (six) hours. 07/30/23   [provider]  cyclobenzaprine  (FLEXERIL ) 10 MG tablet Take 0.5-1 tablets (5-10 mg total) by mouth 3 (three) times daily as needed. 08/23/23    Pender, [name] F, FNP  EPINEPHrine  (EPIPEN  2-PAK) 0.3 mg/0.3 mL IJ SOAJ injection Inject 0.3 mg into the muscle once as needed (for severe allergic reaction). CAll 911 immediately if you have to use this medicine 08/23/23   Pender, [name] F, FNP  gabapentin  (NEURONTIN ) 100 MG capsule Take 2 capsules (200 mg total) by mouth at bedtime. 08/07/23   Lomax, Amy, NP  hydrOXYzine  (VISTARIL ) 25 MG capsule TAKE 1 CAPSULE (25 MG TOTAL) BY MOUTH AT BEDTIME AS NEEDED FOR ANXIETY. 02/21/23   Pender, [name] F, FNP  ibuprofen  (IBU) 800 MG tablet Take 1 tablet (800 mg total) by mouth every 8 (eight) hours as needed for moderate pain (pain score 4-6). 01/18/23   Pender, [name] F, FNP  lamoTRIgine  (LAMICTAL ) 200 MG tablet Take 1 tablet (200 mg total) by mouth 3 (three) times daily. 08/16/23   Lomax, Amy, NP  levETIRAcetam  (KEPPRA ) 750 MG tablet Take 2 tablets (1,500 mg total) by mouth 2 (two) times daily. 08/16/23   Lomax, Amy, NP  meloxicam  (MOBIC ) 15 MG tablet Take 1 tablet (15 mg total) by mouth daily as needed for pain. 09/13/23 09/12/24  Gershon Donnice SAUNDERS, DPM  meloxicam  (MOBIC ) 7.5 MG tablet Take 1 tablet (7.5 mg total) by mouth daily. 08/20/23   Gareth Mliss FALCON, FNP  methylPREDNISolone  (MEDROL  DOSEPAK) 4 MG TBPK tablet Take as directed 08/02/23   Gershon Donnice SAUNDERS, DPM  ondansetron  (ZOFRAN -ODT) 4 MG disintegrating tablet Take 1-2 tablets (4-8 mg total) by mouth every 8 (eight) hours as needed for nausea or vomiting.  09/18/23   Tapia, Leisa, PA-C  oxyCODONE -acetaminophen  (PERCOCET) 10-325 MG tablet Take 1 tablet by mouth every 8 (eight) hours as needed for up to 7 days for pain. 09/26/23 10/03/23  Walisiewicz, Kaitlyn E, PA-C  sertraline  (ZOLOFT ) 100 MG tablet Take 1 tablet (100 mg total) by mouth daily. 08/23/23   Gareth Mliss FALCON, FNP  SYMBICORT  80-4.5 MCG/ACT inhaler Inhale 2 puffs into the lungs 2 (two) times daily as needed (shortness of breath). 08/23/23   Gareth Mliss FALCON, FNP  tirzepatide  (ZEPBOUND ) 5 MG/0.5ML Pen Inject 5 mg  into the skin once a week. 08/23/23   Pender, [name] F, FNP    Allergies: Hydrocodone , Milk-related compounds, Mushroom extract complex (obsolete), Penicillins, Cheese, Latex, and Sulfa antibiotics    Review of Systems  Gastrointestinal:  Positive for abdominal pain.  All other systems reviewed and are negative.   Updated Vital Signs BP 125/75 (BP Location: Right Arm)   Pulse (!) 103   Temp 98.2 F (36.8 C)   Resp 18   LMP 09/03/2023 (Approximate)   SpO2 99%   Physical Exam Vitals and nursing note reviewed.  Constitutional:      General: She is not in acute distress.    Appearance: She is well-developed.  HENT:     Head: Normocephalic and atraumatic.  Eyes:     Conjunctiva/sclera: Conjunctivae normal.  Cardiovascular:     Rate and Rhythm: Normal rate and regular rhythm.     Heart sounds: No murmur heard. Pulmonary:     Effort: Pulmonary effort is normal. No respiratory distress.     Breath sounds: Normal breath sounds.  Abdominal:     Palpations: Abdomen is soft.     Tenderness: There is abdominal tenderness. There is no right CVA tenderness or left CVA tenderness. Negative signs include Murphy's sign and McBurney's sign.     Comments: Generalized lower abdominal tenderness.  Most focal tenderness suprapubic region.  Musculoskeletal:        General: No swelling.     Cervical back: Neck supple.  Skin:    General: Skin is warm and dry.     Capillary Refill: Capillary refill takes less than 2 seconds.  Neurological:     Mental Status: She is alert.  Psychiatric:        Mood and Affect: Mood normal.     (all labs ordered are listed, but only abnormal results are displayed) Labs Reviewed  CBC - Abnormal; Notable for the following components:      Result Value   Hemoglobin 11.7 (*)    HCT 35.9 (*)    All other components within normal limits  LIPASE, BLOOD  COMPREHENSIVE METABOLIC PANEL WITH GFR  PREGNANCY, URINE  URINALYSIS, ROUTINE W REFLEX MICROSCOPIC  HCG,  QUANTITATIVE, PREGNANCY    EKG: None  Radiology: No results found.   Procedures   Medications Ordered in the ED  ketorolac  (TORADOL ) 15 MG/ML injection 15 mg (15 mg Intravenous Given 09/30/23 1454)  ondansetron  (ZOFRAN ) injection 4 mg (4 mg Intravenous Given 09/30/23 1454)  sodium chloride  0.9 % bolus 1,000 mL (1,000 mLs Intravenous New Bag/Given 09/30/23 1459)  morphine  (PF) 4 MG/ML injection 4 mg (4 mg Intravenous Given 09/30/23 1455)                                    Medical Decision Making Amount and/or Complexity of Data Reviewed Labs: ordered. Radiology: ordered.  Risk Prescription  drug management.   This patient presents to the ED for concern of abdominal pain, this involves an extensive number of treatment options, and is a complaint that carries with it a high risk of complications and morbidity.  The differential diagnosis includes cystitis, pyelonephritis, nephrolithiasis, diverticulitis, appendicitis, ectopic pregnancy, tubo-ovarian abscess, ovarian torsion, other   Co morbidities that complicate the patient evaluation  See HPI   Additional history obtained:  Additional history obtained from EMR External records from outside source obtained and reviewed including hospital records   Lab Tests:  I Ordered, and personally interpreted labs.  The pertinent results include: No leukocytosis.  Anemia hemoglobin 11.7.  Platelets within range.  No Electra abnormalities.  No transaminitis.  No renal dysfunction.  Lipase within normal limits.  hCG 183.  UA contaminated sample with 10 squamous epithelial cells with rare bacteria, 30 protein.   Imaging Studies ordered:  Pelvic ultrasound which showed no acute abnormality.  Imaging was independently viewed and interpreted by me and I agree with radiologist interpretation.   Cardiac Monitoring: / EKG:  The patient was maintained on a cardiac monitor.  I personally viewed and interpreted the cardiac monitored which  showed an underlying rhythm of: Sinus rhythm   Consultations Obtained:  N/a   Problem List / ED Course / Critical interventions / Medication management  Lower abdominal pain. I ordered medication including normal saline, Zofran , Toradol , morphine    Reevaluation of the patient after these medicines showed that the patient improved I have reviewed the patients home medicines and have made adjustments as needed   Social Determinants of Health:  Denies tobacco, licit drug use.   Test / Admission - Considered:  Lower abdominal pain. Vitals signs significant for initial tachycardia with time elapsed and medicines administered by the emergency department. Otherwise within normal range and stable throughout visit. Laboratory studies significant for: See above 28 year old female presents emergency department with complaints of continued lower abdominal pain.  Was seen in 09/18/2023 and had CT imaging concerning for mesenteric lymphadenopathy.  Subsequently had a PET scan which showed hypermetabolic central mesenteric lymphadenopathy.  Patient states that she has had continued lower abdominal discomfort ever since being seen.  States that she was prescribed oxycodone  after leaving the ED on 09/18/2023 and had subsequent refill by oncologist that she never picked up from the pharmacy.  States that it was working when she was taking it.  Denies any fevers, chills, vomiting but has felt nauseous.  Denies any urinary symptoms, change in bowel habits. Patient states that her last menstrual cycle ended the first week of this month and is not concerned about pregnancy. On exam initially, patient with diffuse lower abdominal tenderness with most tenderness in pubic/midline pelvic region.  Patient had similar presentation on the ninth and reports persistent pain ever since then.  At that time was found to have lymphadenopathy which persisted through PET scan on the 16th.  Currently following with oncology  regarding this.  Labs today reassuring without acute emergent process.  hCG was very minimally elevated at 183.  Subsequently, before ultrasound was obtained which was negative.  Suspect that patient's ongoing pain for the past 2 weeks after 2 imaging studies of abdomen most likely secondary to this mesenteric node appears to be hypermetabolic.  Offered MRI of abdomen but this was deferred.  Was treated with medications with no improvement of symptoms while in emergency department will recommend follow-up with OB/GYN in the outpatient setting, continue to follow-up with oncology/primary care.  Plan discussed  with patient she is understanding was agreeable.  Patient well-appearing, afebrile in no acute distress upon discharge. Worrisome signs and symptoms were discussed with the patient, and the patient acknowledged understanding to return to the ED if noticed. Patient was stable upon discharge.       Final diagnoses:  None    ED Discharge Orders     None          Silver Wonda LABOR, [place] 09/30/23 1818    Lenor Hollering, MD 09/30/23 [phone]

## 2023-09-30 NOTE — ED Triage Notes (Signed)
 C/o lower abd pain x 4 days. Denies n/v/d.   Had similar episodes on 9/9 and was referred to oncology. Had PET scan, waiting for results

## 2023-09-30 NOTE — Discharge Instructions (Signed)
 Your ultrasound was reassuring.  Showed no acute abnormality.  Your blood pregnancy test was slightly elevated 183.  This is too early to visualize pregnancy.  Recommend follow-up with OB/GYN in the outpatient setting for reassessment preferable within 48 hours to trend hcg.  Recommend beginning prenatal vitamins.  Please avoid medications like opiate medications, NSAIDs for treatment of pain and only use Tylenol .

## 2023-10-02 ENCOUNTER — Encounter (HOSPITAL_COMMUNITY): Payer: Self-pay | Admitting: Obstetrics and Gynecology

## 2023-10-02 ENCOUNTER — Encounter: Payer: Self-pay | Admitting: *Deleted

## 2023-10-02 ENCOUNTER — Inpatient Hospital Stay (HOSPITAL_COMMUNITY)
Admission: AD | Admit: 2023-10-02 | Discharge: 2023-10-02 | Disposition: A | Discharge status: Home / Self Care | Payer: Self-pay | Attending: Family Medicine | Admitting: Family Medicine

## 2023-10-02 ENCOUNTER — Encounter: Payer: Self-pay | Admitting: Medical Oncology

## 2023-10-02 ENCOUNTER — Ambulatory Visit (INDEPENDENT_AMBULATORY_CARE_PROVIDER_SITE_OTHER): Payer: Self-pay

## 2023-10-02 ENCOUNTER — Inpatient Hospital Stay (HOSPITAL_COMMUNITY): Payer: Self-pay

## 2023-10-02 DIAGNOSIS — O26891 Other specified pregnancy related conditions, first trimester: Secondary | ICD-10-CM | POA: Insufficient documentation

## 2023-10-02 DIAGNOSIS — R102 Pelvic and perineal pain: Secondary | ICD-10-CM | POA: Insufficient documentation

## 2023-10-02 DIAGNOSIS — Z113 Encounter for screening for infections with a predominantly sexual mode of transmission: Secondary | ICD-10-CM | POA: Diagnosis present

## 2023-10-02 DIAGNOSIS — R1030 Lower abdominal pain, unspecified: Secondary | ICD-10-CM | POA: Diagnosis present

## 2023-10-02 DIAGNOSIS — Z3A01 Less than 8 weeks gestation of pregnancy: Secondary | ICD-10-CM

## 2023-10-02 DIAGNOSIS — O3680X0 Pregnancy with inconclusive fetal viability, not applicable or unspecified: Secondary | ICD-10-CM

## 2023-10-02 DIAGNOSIS — R590 Localized enlarged lymph nodes: Secondary | ICD-10-CM | POA: Diagnosis present

## 2023-10-02 DIAGNOSIS — Z3490 Encounter for supervision of normal pregnancy, unspecified, unspecified trimester: Secondary | ICD-10-CM

## 2023-10-02 LAB — ABO/RH: ABO/RH(D): AB POS

## 2023-10-02 LAB — CBC
HCT: 37 % (ref 36.0–46.0)
Hemoglobin: 12.1 g/dL (ref 12.0–15.0)
MCH: 28.3 pg (ref 26.0–34.0)
MCHC: 32.7 g/dL (ref 30.0–36.0)
MCV: 86.7 fL (ref 80.0–100.0)
Platelets: 360 K/uL (ref 150–400)
RBC: 4.27 MIL/uL (ref 3.87–5.11)
RDW: 13.2 % (ref 11.5–15.5)
WBC: 6.3 K/uL (ref 4.0–10.5)
nRBC: 0 % (ref 0.0–0.2)

## 2023-10-02 LAB — URINALYSIS, ROUTINE W REFLEX MICROSCOPIC
Bilirubin Urine: NEGATIVE
Glucose, UA: NEGATIVE mg/dL
Hgb urine dipstick: NEGATIVE
Ketones, ur: NEGATIVE mg/dL
Leukocytes,Ua: NEGATIVE
Nitrite: NEGATIVE
Protein, ur: NEGATIVE mg/dL
Specific Gravity, Urine: 1.018 (ref 1.005–1.030)
pH: 6 (ref 5.0–8.0)

## 2023-10-02 LAB — GC/CHLAMYDIA PROBE AMP (~~LOC~~) NOT AT ARMC
Chlamydia: NEGATIVE
Comment: NEGATIVE
Comment: NORMAL
Neisseria Gonorrhea: NEGATIVE

## 2023-10-02 LAB — WET PREP, GENITAL
Clue Cells Wet Prep HPF POC: NONE SEEN
Sperm: NONE SEEN
Trich, Wet Prep: NONE SEEN
WBC, Wet Prep HPF POC: <10 (ref <10)
Yeast Wet Prep HPF POC: NONE SEEN

## 2023-10-02 LAB — HIV ANTIBODY (ROUTINE TESTING W REFLEX): HIV Screen 4th Generation wRfx: NONREACTIVE

## 2023-10-02 LAB — HCG, QUANTITATIVE, PREGNANCY: hCG, Beta Chain, Quant, S: 506 m[IU]/mL — ABNORMAL HIGH (ref <5)

## 2023-10-02 NOTE — Progress Notes (Signed)
 [facility]  Patient called to inform me that she was in the ER on Sunday for abdominal pain and was informed that she was four weeks pregnant. Patient questioning how and if she would still have the biopsy as discussed. Informed patient that I will notify Mallie [facility]) of this new information and will get back to her. Patient gave verbal understanding.   Patient also inquiring of FMLA forms for work. She stated that they were faxed to us  and was asking if we received them, patient had not received confirmation of receipt from us . I informed patient not sure what fax they were sent to and asked her to fax them to me and I will provide forms to our [facility] coordinators. Patient has my fax information.   Patient denies further questions at this time.   Colene KYM Raider, RN, BSN, [facility] Nurse Navigator, [facility] 10/02/2023 3:26 PM

## 2023-10-02 NOTE — MAU Note (Signed)
..  Latoya Stark is a 28 y.o. at [redacted]w[redacted]d here in MAU reporting: patient reports lower abdominal cramping that has been ongoing. She reports she is unsure if it is pregnancy related or due to her active lymph nodes that were found on a recent PET scan. Note from 9/19 reports PET scan shows nonspecific findings of hypermetabolic central mesenteric lymphadenopathy and porta hepatis lymphadenopathy. She had a quant done x2 days ago that was 183. She denies vaginal bleeding or abnormal discharge.   Pain score: 8 Vitals:   10/02/23 1114  BP: 123/64  Pulse: 90  Resp: 14  Temp: 98.4 F (36.9 C)  SpO2: 100%      Lab orders placed from triage:   UA

## 2023-10-02 NOTE — Progress Notes (Signed)
 Pt presents for pregnancy confirmation. After chart review, RN consulted with Nidia Daring, FNP. Recently went to ED for abdominal pain. She does note irregular periods, and her abdominal pain has increased, but no bleeding. Aching/cramping pain across top of stomach and lower 8/10 at time of visit. Pt was advised to present to MAU for further workup/evaluation due to increased abdominal pain to possibly rule out ectopic pregnancy, due to no confirmed IUP.

## 2023-10-02 NOTE — Discharge Instructions (Signed)
 You were seen at the maternity assessment unit for abdominal pain in early pregnancy.  Your pregnancy hormone is appropriately rising from your last ER visit.  Your ultrasound continues to show no pregnancy within your uterus.  This can be very normal at very early pregnancy.  We recommend that you return to the maternity assessment unit for  Severe abdominal pain Bleeding that soaks more than a pad in 1 hour Nausea and vomiting with inability to hold down liquids Any other pregnancy related concern  We have ordered an outpatient [facility].  This was scheduled in approximately 2 weeks.  If you need to change the date or time of this ultrasound the contact information is on your discharge summary.

## 2023-10-02 NOTE — MAU Provider Note (Signed)
 History     CSN: [id number]  Arrival date and time: 10/02/23 1050   Event Date/Time   First Provider Initiated Contact with Patient 10/02/23 1416      Chief Complaint  Patient presents with   Abdominal Pain   Latoya Stark is a [id number] at [redacted]w[redacted]d presenting with worsening lower abdominal pain. Describes at cramping. She reports if this pregnancy related or related to her enlarged lymph nodes. She has a bhcg 2 days ago that was 183. Denies vaginal bleeding or discharge.    Abdominal Pain Pertinent negatives include no diarrhea, dysuria, fever, nausea or vomiting.    OB History     Gravida  9   Para  2   Term  2   Preterm      AB  6   Living  2      SAB  4   IAB  1   Ectopic      Multiple  0   Live Births  2           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   started at age 6   Headache    Obesity    Pneumonia    Postpartum depression, postpartum condition 08/11/2021   Seizures (HCC)    when I get too hot   Trichomonas infection    Umbilical hernia 04/2022   UTI (lower urinary tract infection)    Vulvar abscess 08/11/2021    Past Surgical History:  Procedure Laterality Date   COLONOSCOPY N/A 05/22/2023   Procedure: COLONOSCOPY;  Surgeon: Therisa Bi, MD;  Location: [facility] ENDOSCOPY;  Service: Gastroenterology;  Laterality: N/A;   DILATION AND EVACUATION N/A 08/09/2013   Procedure: DILATATION AND EVACUATION;  Surgeon: Aida DELENA Na,  MD;  Location: [facility] ORS;  Service: Gynecology;  Laterality: N/A;   DILATION AND EVACUATION  2017   ESOPHAGOGASTRODUODENOSCOPY (EGD) WITH PROPOFOL  N/A 11/10/2022   Procedure: ESOPHAGOGASTRODUODENOSCOPY (EGD) WITH PROPOFOL ;  Surgeon: Therisa Bi, MD;  Location[facility] ENDOSCOPY;  Service: Gastroenterology;  Laterality: N/A;   EUSTACHIAN TUBE DILATION     EXCISION MASS NECK N/A 11/16/2022   Procedure: EXCISION MASS NECK;  Surgeon: Jordis Laneta FALCON, MD;  Location: [facility] ORS;  Service: General;  Laterality: N/A;  JP drain placed   LACERATION REPAIR Left 2007   5th toe   ORTHOPEDIC SURGERY Left 2010   hand fracture; screws in place   TONSILLECTOMY AND ADENOIDECTOMY  2001   TYMPANOSTOMY TUBE PLACEMENT Bilateral 2002   UMBILICAL HERNIA REPAIR N/A 05/12/2022   Procedure: LAPAROSCOPIC ASSISTED SUPRA UMBILICAL HERNIA REPAIR;  Surgeon: Gladis Cough, MD;  Location: [facility] ORS;  Service: General;  Laterality: N/A;    Family History  Problem Relation Age of Onset   Depression Mother    Heart disease Mother    Cancer Mother        Bone cancer   Seizures Mother  Stroke Mother    Sickle cell anemia Mother    Thyroid  disease Mother    Heart disease Father    Hypertension Father    Seizures Maternal Aunt    Diabetes Maternal Grandmother    Colon cancer Maternal Grandmother    Hypertension Maternal Grandmother    Clotting disorder Maternal Grandmother    Breast cancer Maternal Grandmother    Stroke Maternal Grandmother    Diabetes Paternal Grandmother     Social History   Tobacco Use   Smoking status: Never    Passive exposure: Never   Smokeless tobacco: Never  Vaping Use   Vaping status: Never Used  Substance Use Topics   Alcohol use: Yes    Comment: occassional   Drug use: No    Allergies:  Allergies  Allergen Reactions   Hydrocodone  Hives   Milk-Related Compounds Dermatitis    Breaks face out *can have 2% milk*   Mushroom Extract Complex (Obsolete) Anaphylaxis   Penicillins  Anaphylaxis    Has patient had a PCN reaction causing immediate rash, facial/tongue/throat swelling, SOB or lightheadedness with hypotension: Yes Has patient had a PCN reaction causing severe rash involving mucus membranes or skin necrosis: No Has patient had a PCN reaction that required hospitalization No Has patient had a PCN reaction occurring within the last 10 years: Yes If all of the above answers are NO, then may proceed with Cephalosporin use.    Cheese Other (See Comments)    Breaks face out   Latex Itching and Swelling   Sulfa Antibiotics Swelling    Medications Prior to Admission  Medication Sig Dispense Refill Last Dose/Taking   gabapentin  (NEURONTIN ) 100 MG capsule Take 2 capsules (200 mg total) by mouth at bedtime. 180 capsule 0 10/01/2023   hydrOXYzine  (VISTARIL ) 25 MG capsule TAKE 1 CAPSULE (25 MG TOTAL) BY MOUTH AT BEDTIME AS NEEDED FOR ANXIETY. 90 capsule 0 Past Week   lamoTRIgine  (LAMICTAL ) 200 MG tablet Take 1 tablet (200 mg total) by mouth 3 (three) times daily. 90 tablet 0 10/01/2023   levETIRAcetam  (KEPPRA ) 750 MG tablet Take 2 tablets (1,500 mg total) by mouth 2 (two) times daily. 120 tablet 0 10/01/2023   ondansetron  (ZOFRAN -ODT) 4 MG disintegrating tablet Take 1-2 tablets (4-8 mg total) by mouth every 8 (eight) hours as needed for nausea or vomiting. 20 tablet 0 10/01/2023   oxyCODONE -acetaminophen  (PERCOCET) 10-325 MG tablet Take 1 tablet by mouth every 8 (eight) hours as needed for up to 7 days for pain. 21 tablet 0 Past Week   albuterol  (VENTOLIN  HFA) 108 (90 Base) MCG/ACT inhaler Inhale 1-2 puffs into the lungs every 6 (six) hours as needed for wheezing or shortness of breath. 18 g 11    busPIRone  (BUSPAR ) 5 MG tablet Take 1 tablet (5 mg total) by mouth 3 (three) times daily. 90 tablet 0    cetirizine  (ZYRTEC ) 10 MG tablet Take 1 tablet (10 mg total) by mouth daily. 30 tablet 0    clindamycin  (CLEOCIN ) 300 MG capsule Take 300 mg by mouth every 6 (six) hours.       cyclobenzaprine  (FLEXERIL ) 10 MG tablet Take 0.5-1 tablets (5-10 mg total) by mouth 3 (three) times daily as needed. 30 tablet 0    Doxylamine -Pyridoxine  (DICLEGIS ) 10-10 MG TBEC Take 1 tablet by mouth See admin instructions. (Diclegis (R)) Initial, 2 tablets (doxylamine  succinate 10 mg/pyridoxine  hydrochloride 10 mg per tablet) orally at bedtime on day 1 and 2; if symptoms persist, take 1 tablet in morning and  2 tablets at bedtime on day 3; if symptoms persist, may increase to MAX 4 tablets per day, administered as 1 tablet in the morning, 1 tablet in mid-afternoon and 2 tablets at bedtime 60 tablet 0    EPINEPHrine  (EPIPEN  2-PAK) 0.3 mg/0.3 mL IJ SOAJ injection Inject 0.3 mg into the muscle once as needed (for severe allergic reaction). CAll 911 immediately if you have to use this medicine 2 each 1    ibuprofen  (IBU) 800 MG tablet Take 1 tablet (800 mg total) by mouth every 8 (eight) hours as needed for moderate pain (pain score 4-6). 90 tablet 1    meloxicam  (MOBIC ) 15 MG tablet Take 1 tablet (15 mg total) by mouth daily as needed for pain. 30 tablet 0    meloxicam  (MOBIC ) 7.5 MG tablet Take 1 tablet (7.5 mg total) by mouth daily. 30 tablet 0    methylPREDNISolone  (MEDROL  DOSEPAK) 4 MG TBPK tablet Take as directed 21 tablet 0    sertraline  (ZOLOFT ) 100 MG tablet Take 1 tablet (100 mg total) by mouth daily. 90 tablet 1    SYMBICORT  80-4.5 MCG/ACT inhaler Inhale 2 puffs into the lungs 2 (two) times daily as needed (shortness of breath). 10.2 g 10    tirzepatide  (ZEPBOUND ) 5 MG/0.5ML Pen Inject 5 mg into the skin once a week. 2 mL 0     Review of Systems  Constitutional:  Negative for chills and fever.  HENT:  Negative for congestion and sore throat.   Eyes:  Negative for pain and visual disturbance.  Respiratory:  Negative for cough, chest tightness and shortness of breath.   Cardiovascular:  Negative for chest pain.  Gastrointestinal:  Positive for abdominal pain. Negative for diarrhea, nausea and  vomiting.  Endocrine: Negative for cold intolerance and heat intolerance.  Genitourinary:  Negative for dysuria and flank pain.  Musculoskeletal:  Negative for back pain.  Skin:  Negative for rash.  Allergic/Immunologic: Negative for food allergies.  Neurological:  Negative for dizziness and light-headedness.  Psychiatric/Behavioral:  Negative for agitation.    Physical Exam   Blood pressure 123/64, pulse 90, temperature 98.4 F (36.9 C), temperature source Oral, resp. rate 14, weight (!) 152 kg, last menstrual period 09/03/2023, SpO2 100%.  Physical Exam Vitals and nursing note reviewed.  Constitutional:      General: She is not in acute distress.    Appearance: She is well-developed.  HENT:     Head: Normocephalic and atraumatic.  Eyes:     General: No scleral icterus.    Conjunctiva/sclera: Conjunctivae normal.  Cardiovascular:     Rate and Rhythm: Normal rate.  Pulmonary:     Effort: Pulmonary effort is normal.  Chest:     Chest wall: No tenderness.  Abdominal:     Palpations: Abdomen is soft.     Tenderness: There is no abdominal tenderness. There is no guarding or rebound.  Genitourinary:    Vagina: Normal.  Musculoskeletal:        General: Normal range of motion.     Cervical back: Normal range of motion and neck supple.  Skin:    General: Skin is warm and dry.     Findings: No rash.  Neurological:     Mental Status: She is alert and oriented to person, place, and time.     MAU Course  Procedures  MDM: high  This patient presents to the ED for concern of   Chief Complaint  Patient presents with   Abdominal Pain  These complains involves an extensive number of treatment options, and is a complaint that carries with it a high risk of complications and morbidity.  The differential diagnosis for  1.abdominal pain in early pregnancy INCLUDES threatened miscarriage, ectopic pregnancy (unless IUP confirmed), pelvic infection, urinary tract infection or  normal variant ligament related discomfort with live IUP.  Rare but possible DDX include appendicitis. Unsure viability at this point/.     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/23/2023   Class 3 severe obesity due to excess calories with serious comorbidity and body mass index (BMI) of 50.0 to 59.9 in adult 02/15/2023   Mild persistent asthma without complication 11/24/2022   Anemia 11/24/2022   Mild episode of recurrent major depressive disorder 11/24/2022   Anxiety 11/24/2022   Lipoma of neck 11/16/2022   Dysphagia 11/10/2022   Chronic migraine without aura without status migrainosus, not intractable 06/22/2020   BMI 50.0-59.9, adult (HCC) 02/20/2019   Conversion disorder 02/10/2019   History of seizure 07/30/2018   Other constipation 02/24/2014     Additional history obtained from partner  External records from outside source obtained and reviewed including Scanned media records, CareEverywhere, and Prenatal care records  I ordered, and personally interpreted labs.  The pertinent results include:   Results for orders placed or performed during the hospital encounter of 10/02/23 (from the past 24 hours)  GC/Chlamydia probe amp ([place])not at [facility]     Status: None   Collection Time: 10/02/23 11:21 AM  Result Value Ref Range   Neisseria Gonorrhea Negative    Chlamydia Negative    Comment Normal Reference Ranger Chlamydia - Negative    Comment      Normal Reference Range Neisseria Gonorrhea - Negative  Urinalysis, Routine w reflex microscopic -Urine, Clean Catch     Status: Abnormal   Collection Time: 10/02/23 11:25 AM  Result Value Ref Range   Color, Urine YELLOW YELLOW   APPearance HAZY (A) CLEAR   Specific Gravity, Urine 1.018 1.005 - 1.030   pH 6.0 5.0 - 8.0   Glucose, UA NEGATIVE NEGATIVE mg/dL   Hgb urine dipstick NEGATIVE NEGATIVE    Bilirubin Urine NEGATIVE NEGATIVE   Ketones, ur NEGATIVE NEGATIVE mg/dL   Protein, ur NEGATIVE NEGATIVE mg/dL   Nitrite NEGATIVE NEGATIVE   Leukocytes,Ua NEGATIVE NEGATIVE  Wet prep, genital     Status: None   Collection Time: 10/02/23 11:25 AM   Specimen: Urine, Clean Catch  Result Value Ref Range   Yeast Wet Prep HPF POC NONE SEEN NONE SEEN   Trich, Wet Prep NONE SEEN NONE SEEN   Clue Cells Wet Prep HPF POC NONE SEEN NONE SEEN   WBC, Wet Prep HPF POC <10 <10   Sperm NONE SEEN   CBC     Status: None   Collection Time: 10/02/23 11:32 AM  Result Value Ref Range   WBC 6.3 4.0 - 10.5 K/uL   RBC 4.27 3.87 - 5.11 MIL/uL   Hemoglobin 12.1 12.0 - 15.0 g/dL   HCT 62.9 63.9 - 53.9 %   MCV 86.7 80.0 - 100.0 fL   MCH 28.3 26.0 - 34.0 pg   MCHC 32.7 30.0 - 36.0 g/dL   RDW 86.7 88.4 - 84.4 %   Platelets 360 150 - 400 K/uL   nRBC 0.0 0.0 - 0.2 %  ABO/Rh     Status: None  Collection Time: 10/02/23 11:32 AM  Result Value Ref Range   ABO/RH(D) AB POS    No rh immune globuloin      NOT A RH IMMUNE GLOBULIN CANDIDATE, PT RH POSITIVE Performed at [facility], [address], [place], [place] [address]   hCG, quantitative, pregnancy     Status: Abnormal   Collection Time: 10/02/23 11:32 AM  Result Value Ref Range   hCG, Beta Chain, Quant, S 506 (H) <5 mIU/mL  HIV Antibody (routine testing w rflx)     Status: None   Collection Time: 10/02/23 11:32 AM  Result Value Ref Range   HIV Screen 4th Generation wRfx Non Reactive Non Reactive     Imaging Studies ordered:  I ordered imaging studies includingTransvaginal US  I independently visualized and interpreted imaging which showed No IUP, need repeat US  in 14 days.  I agree with the radiologist interpretation   After the interventions noted above, I reevaluated the patient and found that they have :improved  Dispostion: discharged   Assessment and Plan   1. Pregnancy of unknown anatomic location   2. Pelvic pain affecting  pregnancy in first trimester, antepartum   3. Early stage of pregnancy    - Discharged home, stable condition - BHCG appropriately rising - Viability US  in 14 days, appt made at [facility] for US   Future Appointments  Date Time Provider Department Center  10/05/2023  8:15 AM Suitor, Burnard PARAS [facility] None  10/11/2023 10:30 AM Vear, Charlie LABOR, MD [facility] None  10/16/2023  8:35 AM [facility] US2 [facility]  10/19/2023 11:00 AM Gareth Mliss FALCON, FNP [facility] [place]  11/23/2023  7:40 AM Gareth Mliss FALCON, FNP [facility] [place]  02/05/2024 11:00 AM Cary No, NP [facility] None     Suzen Maryan Masters 10/02/2023, 2:16 PM

## 2023-10-03 ENCOUNTER — Telehealth: Payer: Self-pay | Admitting: Physician Assistant

## 2023-10-03 ENCOUNTER — Encounter: Payer: Self-pay | Admitting: Medical Oncology

## 2023-10-03 NOTE — Telephone Encounter (Signed)
 Contacted patient to discuss recent ED work up and labs. Per MAU work up yesterday patient is now [redacted]w[redacted]d pregnant per hCG beta quant 506. Patient is planning to have her quant rechecked later this week and is scheduled for an US  on 10/16/23 to verify viability. Discussed with patient oncology recommends continuing with OB work up at this time to r/o ectopic pregnancy. This could also help explain her symptoms of abdominal pain we are seeing her for. I previously prescribed Percocet for her severe pain based on negative urine pregnancy test on 09/18/23 from ED visit. Patient has not taken the pain medicine and I advised her not to while pregnant. Discussed symptoms that would warrant ED evaluation. Patient agreeable with plan. Dr. Federico was updated and agrees with plan also.

## 2023-10-03 NOTE — Progress Notes (Signed)
 [facility]  Received patient's FMLA forms. These were provided to our Form and Supportive Medication PA Coordinators.  Colene KYM Raider, RN, BSN, [facility] Oncology Nurse Navigator, [facility] 10/03/2023 9:18 AM

## 2023-10-05 ENCOUNTER — Other Ambulatory Visit: Payer: Self-pay | Admitting: *Deleted

## 2023-10-05 NOTE — Telephone Encounter (Signed)
 Chell from [facility] called to speak to someone about PT  FMLA paperwork PT paperwork came to Their office and wanted speak to coordinator , Inform that coordinator is noting today , However gave Coordinator email to to email over  Paperwork that needs to be filled out by the correct office .  Chell states she  saw message from our office about Pt Paperwork    Chell callback is  [phone]

## 2023-10-08 ENCOUNTER — Inpatient Hospital Stay (HOSPITAL_COMMUNITY)
Admission: AD | Admit: 2023-10-08 | Discharge: 2023-10-09 | Disposition: A | Discharge status: Home / Self Care | Payer: Self-pay | Attending: Obstetrics & Gynecology | Admitting: Obstetrics & Gynecology

## 2023-10-08 DIAGNOSIS — R109 Unspecified abdominal pain: Secondary | ICD-10-CM | POA: Diagnosis present

## 2023-10-08 DIAGNOSIS — O26899 Other specified pregnancy related conditions, unspecified trimester: Secondary | ICD-10-CM

## 2023-10-08 DIAGNOSIS — B3731 Acute candidiasis of vulva and vagina: Secondary | ICD-10-CM

## 2023-10-08 DIAGNOSIS — Z3A01 Less than 8 weeks gestation of pregnancy: Secondary | ICD-10-CM | POA: Insufficient documentation

## 2023-10-08 DIAGNOSIS — K5901 Slow transit constipation: Secondary | ICD-10-CM

## 2023-10-08 DIAGNOSIS — O26891 Other specified pregnancy related conditions, first trimester: Secondary | ICD-10-CM | POA: Diagnosis present

## 2023-10-08 LAB — WET PREP, GENITAL
Clue Cells Wet Prep HPF POC: NONE SEEN
Sperm: NONE SEEN
Trich, Wet Prep: NONE SEEN
WBC, Wet Prep HPF POC: <10 (ref <10)

## 2023-10-08 LAB — CBC WITH DIFFERENTIAL/PLATELET
Abs Immature Granulocytes: 0.03 K/uL (ref 0.00–0.07)
Basophils Absolute: 0 K/uL (ref 0.0–0.1)
Basophils Relative: 0 %
Eosinophils Absolute: 0.2 K/uL (ref 0.0–0.5)
Eosinophils Relative: 2 %
HCT: 36.3 % (ref 36.0–46.0)
Hemoglobin: 11.9 g/dL — ABNORMAL LOW (ref 12.0–15.0)
Immature Granulocytes: 0 %
Lymphocytes Relative: 29 %
Lymphs Abs: 2.2 K/uL (ref 0.7–4.0)
MCH: 28.4 pg (ref 26.0–34.0)
MCHC: 32.8 g/dL (ref 30.0–36.0)
MCV: 86.6 fL (ref 80.0–100.0)
Monocytes Absolute: 0.6 K/uL (ref 0.1–1.0)
Monocytes Relative: 8 %
Neutro Abs: 4.7 K/uL (ref 1.7–7.7)
Neutrophils Relative %: 61 %
Platelets: 361 K/uL (ref 150–400)
RBC: 4.19 MIL/uL (ref 3.87–5.11)
RDW: 13.4 % (ref 11.5–15.5)
WBC: 7.8 K/uL (ref 4.0–10.5)
nRBC: 0 % (ref 0.0–0.2)

## 2023-10-08 LAB — URINALYSIS, ROUTINE W REFLEX MICROSCOPIC
Bacteria, UA: NONE SEEN
Bilirubin Urine: NEGATIVE
Glucose, UA: NEGATIVE mg/dL
Hgb urine dipstick: NEGATIVE
Ketones, ur: NEGATIVE mg/dL
Leukocytes,Ua: NEGATIVE
Nitrite: NEGATIVE
Protein, ur: 30 mg/dL — AB
Specific Gravity, Urine: 1.027 (ref 1.005–1.030)
pH: 6 (ref 5.0–8.0)

## 2023-10-08 LAB — HCG, QUANTITATIVE, PREGNANCY: hCG, Beta Chain, Quant, S: 6742 m[IU]/mL — ABNORMAL HIGH (ref <5)

## 2023-10-08 NOTE — MAU Note (Incomplete)
 Maternal Assessment Unit Provider Note  Subjective: [name] is a 28 y.o. [phone] pregnant female at [redacted]w[redacted]d who presents to MAU today with complaint of abdominal pain and back pain.   Receives care at ***. Prenatal records reviewed.  Pertinent items noted in HPI and remainder of comprehensive ROS otherwise negative.   Objective: BP 107/67 (BP Location: Right Arm)   Pulse 95   Temp 97.9 F (36.6 C) (Oral)   Resp 14   Ht 5' 5 (1.651 m)   Wt (!) 150.3 kg   LMP 09/03/2023 (Approximate)   BMI 55.13 kg/m  Physical Exam  *** HELP TEXT ***  This SmartLink shows component results for current & previous visits.  It will accept a parameter to specify a lookback time duration.  For example, you may enter .RESULTRCNT[6W  The 6W instructs the system to search 6 weeks back from now to find and display all component values in that time period.  Entry for this parameter may be in the form #H or #W. The # indicates a number and the H or W stand for hours or weeks respectively.    MDM: *** risk  MAU Course:  Time: ***  FHT: baseline *** bpm, moderate variability, *** accelerations, *** decelerations,   Contractions: q *** mins,      Questions were answered to the satisfaction of the patient and/or family prior to discharge.   Assessment Medical screening exam {Blank single:19197::complete,started}  No diagnosis found.   Plan Discharge from MAU in stable condition with *** precautions Follow up at *** as scheduled for ongoing prenatal care  Allergies as of 10/08/2023       Reactions   Hydrocodone  Hives   Milk-related Compounds Dermatitis   Breaks face out *can have 2% milk*   Mushroom Extract Complex (obsolete) Anaphylaxis   Penicillins Anaphylaxis   Has patient had a PCN reaction causing immediate rash, facial/tongue/throat swelling, SOB or lightheadedness with hypotension: Yes Has patient had a PCN reaction causing severe rash involving mucus  membranes or skin necrosis: No Has patient had a PCN reaction that required hospitalization No Has patient had a PCN reaction occurring within the last 10 years: Yes If all of the above answers are NO, then may proceed with Cephalosporin use.   Cheese Other (See Comments)   Breaks face out   Latex Itching, Swelling   Sulfa Antibiotics Swelling     Med Rec must be completed prior to using this [facility]***       Trudy Leeroy NOVAK, MD 10/08/2023 9:38 PM

## 2023-10-08 NOTE — MAU Note (Signed)
 Pt says she has lower abd pain- started 2 weeks ago- worse tonight -  At 430pm- XS Tyl 2 tab  Also has lower back pain - achy dull- started Friday -minimal relief.  Had spotting 2 weeks ago - none tonight . Was seen here  2 weeks ago -labs , U/S

## 2023-10-09 ENCOUNTER — Inpatient Hospital Stay (HOSPITAL_COMMUNITY): Payer: Self-pay

## 2023-10-09 ENCOUNTER — Telehealth: Payer: Self-pay

## 2023-10-09 LAB — GC/CHLAMYDIA PROBE AMP (~~LOC~~) NOT AT ARMC
Chlamydia: NEGATIVE
Comment: NEGATIVE
Comment: NORMAL
Neisseria Gonorrhea: NEGATIVE

## 2023-10-09 MED ORDER — MICONAZOLE NITRATE 4 % VA CREA: 1.0000 | TOPICAL_CREAM | Freq: Every evening | VAGINAL | 0 refills | Status: AC

## 2023-10-09 MED ORDER — PROPOFOL 1000 MG/100ML IV EMUL
INTRAVENOUS | Status: AC
  Filled 2023-10-09: qty 500

## 2023-10-09 MED ORDER — POLYETHYLENE GLYCOL 3350 17 GM/SCOOP PO POWD: 17.0000 g | Freq: Two times a day (BID) | ORAL | 0 refills | Status: AC

## 2023-10-09 MED ORDER — ACETAMINOPHEN 500 MG PO TABS
1000.0000 mg | ORAL_TABLET | Freq: Once | ORAL | Status: AC
  Administered 2023-10-09: 1000 mg via ORAL
  Filled 2023-10-09: qty 2

## 2023-10-09 MED ORDER — CYCLOBENZAPRINE HCL 5 MG PO TABS
10.0000 mg | ORAL_TABLET | Freq: Once | ORAL | Status: AC
  Administered 2023-10-09: 10 mg via ORAL
  Filled 2023-10-09: qty 2

## 2023-10-09 MED ORDER — PHENYLEPHRINE HCL-NACL 20-0.9 MG/250ML-% IV SOLN
INTRAVENOUS | Status: AC
  Filled 2023-10-09: qty 1250

## 2023-10-09 NOTE — Telephone Encounter (Signed)
 Copied from CRM [id number]. Topic: General - Other >> Oct 08, 2023  5:03 PM Wess RAMAN wrote: Reason for CRM: Patient would like to speak with the nurse about her short term disability  Callback #: [phone]

## 2023-10-09 NOTE — Discharge Instructions (Addendum)
 Hi Shana, It was nice to meet you. Congratulations, the ultrasound showed you have an intrauterine pregnancy. You need to follow up at your doctor's office on Oct 2nd to get a quant hcg level and follow up at your previously scheduled US .  Your abdominal pain is probably aggravated by constipation. Take miralax  twice a day, drink at least 8 glasses of fluids, and take 400mg  of over the counter magnesium  citrate before bedtime.  You can take acetaminophen  1000mg  every 6 hours as needed for pain.  Return to the ER for severe pelvic pain or vaginal bleeding that is more than spotting.  Take care, Dr. Trudy

## 2023-10-09 NOTE — Telephone Encounter (Signed)
 Notified the pt regarding her FMLA form being completed,faxed, and confirmation received.  Pt copy was mailed. No questions or concerns to be noted at this time.

## 2023-10-10 DIAGNOSIS — Z0289 Encounter for other administrative examinations: Secondary | ICD-10-CM

## 2023-10-11 ENCOUNTER — Ambulatory Visit (INDEPENDENT_AMBULATORY_CARE_PROVIDER_SITE_OTHER): Payer: Self-pay | Admitting: Neurology

## 2023-10-11 ENCOUNTER — Encounter: Payer: Self-pay | Admitting: Neurology

## 2023-10-11 ENCOUNTER — Ambulatory Visit: Payer: Self-pay | Admitting: Nurse Practitioner

## 2023-10-11 ENCOUNTER — Ambulatory Visit: Payer: Self-pay | Admitting: Obstetrics and Gynecology

## 2023-10-11 ENCOUNTER — Telehealth: Payer: Self-pay | Admitting: Neurology

## 2023-10-11 ENCOUNTER — Other Ambulatory Visit (INDEPENDENT_AMBULATORY_CARE_PROVIDER_SITE_OTHER): Payer: Self-pay

## 2023-10-11 VITALS — BP 113/80 | HR 112 | Ht 65.0 in | Wt 331.5 lb

## 2023-10-11 DIAGNOSIS — R519 Headache, unspecified: Secondary | ICD-10-CM

## 2023-10-11 DIAGNOSIS — Z3200 Encounter for pregnancy test, result unknown: Secondary | ICD-10-CM

## 2023-10-11 DIAGNOSIS — R0683 Snoring: Secondary | ICD-10-CM

## 2023-10-11 DIAGNOSIS — G43709 Chronic migraine without aura, not intractable, without status migrainosus: Secondary | ICD-10-CM

## 2023-10-11 DIAGNOSIS — R202 Paresthesia of skin: Secondary | ICD-10-CM

## 2023-10-11 DIAGNOSIS — M25531 Pain in right wrist: Secondary | ICD-10-CM

## 2023-10-11 DIAGNOSIS — G40409 Other generalized epilepsy and epileptic syndromes, not intractable, without status epilepticus: Secondary | ICD-10-CM

## 2023-10-11 DIAGNOSIS — R200 Anesthesia of skin: Secondary | ICD-10-CM

## 2023-10-11 LAB — BETA HCG QUANT (REF LAB): hCG Quant: 9216 m[IU]/mL

## 2023-10-11 NOTE — Progress Notes (Signed)
 MyChart message sent advising to proceed with US  as scheduled. Verified MyChart message was read.

## 2023-10-11 NOTE — Progress Notes (Addendum)
 SUBJECTIVE:  28 y.o. OB [id number] presents for STAT HCG.  Patient's last menstrual period was 09/03/2023 (approximate).  OBJECTIVE:  She appears well, afebrile.  .  ASSESSMENT:  Denies bleeding and pain.  PLAN:  STAT Hcg sent to Lab Treatment: Pt will be called with results.To be determined once lab results are received ROV prn if symptoms persist or worsen.

## 2023-10-11 NOTE — Progress Notes (Signed)
 [facility]  PATIENT: Latoya Stark DOB: 04/17/1995  REFERRING DOCTOR OR PCP:  Rosario is [name] SOURCE: patient, fiance, notes from ED, labs  _________________________________   HISTORICAL  CHIEF COMPLAINT:  Chief Complaint  Patient presents with   Consult    Pt in room 11. Boyfriend in room. internal referral for right wrist pain, numbness req. NCV/EMG.    HISTORY OF PRESENT ILLNESS:  Latoya Stark is a 28 y.o. woman with headaches and seizures.  UPDATE 10/11/2023:  Seizures: She just found out this week that she is pregnant.   In the past, seizures have occurred during pregnancy we discussed the importance of staying on therapy.  Her last seizure was 2 months ago (Aug 2025) and she notes she was not completely compliant.   She has been more compliant recently and no further seizure.   Seizure was GTC x 1-2 minutes and groggy afterwards.   She has an EEG scheduled 10/25/2023.   She takes Keppra  1500 mg twice a day and lamotigine 200 mg po tid.     Wrist pain:  She had a lipoma removed from her anterior left neck last year.   She noted wrist pain on the right after her surgery.   She reports she had an IV in the right forearm.  Current pain is more in the right lower forearm than wrist - she reports near where the IV was.   Her right hand trmebles when she holds items.  She feels the arm is mildly weak.   She is left handed.       She also gets a lot of headaches.   Topiramate  had not helped.   With a headache, she gets some nausea but has a lot of photophobia (no phonophobia).     She was having left sided numbness at last visit.   MRI 11/2020 was normal.    She has had intermittent leg numbness.  This was most intense during pregnancy in 2020/2021.   At that time (02/10/2019) she had normal MRI of the cervical, thoracic and lumbar spine).  MRi brain 02/09/2019 was also normal.    She has snoring and wakes up with headaches.   She will be doing a PSG  (already ordered)  Seizure history: She has had seizures since age 46.    In 05/2018,  she was sitting in the car talking to her boyfriend and then seized (2-3 minutes GTC) and was taken to the ED,    She reports being told that she was slurring her words and not making sense for a lttle bit before the seizure.  She also complained of a headache.    She went to the ED and was placed on Keppra .   She had another seizure 6/8/2022She was taken to [facility] ED.   She had CT head and cervical spine and had xrays of right shoulder and lumbar spine.     She had been on Keppra  1500 mg bid.     Her level was good at 23.7 (10-40 is range)  She is having a lot of headaches.   They are located left frontal or bitemporal.   Quality is pounding.     She is having 6 sever HA a month.   She notes lights sometimes trigger them.   When present, she has photophobia (not much phonophobia), nausea and rare vomiting.  Sometimes she feels lightheaded.  Moving worsens the pain.     I personally reviewed the  CT scan of the head and cervical spine from 06/16/2020.  Both are normal.  There are no acute findings.   Laboratory tests from 06/16/2020 were also reviewed.  The Tox  screen showed benzodiazepines (received some before the test due to seizure).  CMP, CBC, hCG were negative or noncontributory   Seizure History She had her first seizure at age 37.  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    She has been having one every 2-3 months, even while on treatment.  She stopped Dilantin  in 2020 (10 days or so before the seizure prompting initial consult with me) due to pregnancy.    I switched her to Keppra , initially 750 mg twice daily and she was titrated up to 1500 mg twice daily due to breakthrough activity.   She had additional breakthrough seizures in 2022 and on 06/22/2020 Topamax  100 mg twice daily was added.  HA history She has had migraine headaches since her teen  years and the frequency increased in 2020 to become daily.  Generally, headaches are located bilaterally, forehead > temporal region.    She gets nausea, severe photophobia and minimal phonophobia.    Headaches usually improve if she sleeps them off.    Heat triggers the headache.  Light sometimes triggers the headaches Tylenol  sometimes stops the headaches.   Topamax  had helped her headaches with benefit in the past.  Keppra  initially helped the headaches.SABRA       REVIEW OF SYSTEMS: Constitutional: No fevers, chills, sweats, or change in appetite Eyes: No visual changes, double vision, eye pain Ear, nose and throat: No hearing loss, ear pain, nasal congestion, sore throat Cardiovascular: No chest pain, palpitations Respiratory:  No shortness of breath at rest or with exertion.   No wheezes GastrointestinaI: She has had episodes of abdominal pain in the past.  No nausea, vomiting, diarrhea, fecal incontinence Genitourinary:  No dysuria, urinary retention or frequency.  No nocturia. Musculoskeletal:  No neck pain, back pain Integumentary: No rash, pruritus, skin lesions Neurological: as above Psychiatric: No depression at this time.  No anxiety Endocrine: No palpitations, diaphoresis, change in appetite, change in weigh or increased thirst Hematologic/Lymphatic:  No anemia, purpura, petechiae. Allergic/Immunologic: No itchy/runny eyes, nasal congestion, recent allergic reactions, rashes  ALLERGIES: Allergies  Allergen Reactions   Hydrocodone  Hives   Milk-Related Compounds Dermatitis    Breaks face out *can have 2% milk*   Mushroom Extract Complex (Obsolete) Anaphylaxis   Penicillins Anaphylaxis    Has patient had a PCN reaction causing immediate rash, facial/tongue/throat swelling, SOB or lightheadedness with hypotension: Yes Has patient had a PCN reaction causing severe rash involving mucus membranes or skin necrosis: No Has patient had a PCN reaction that required hospitalization  No Has patient had a PCN reaction occurring within the last 10 years: Yes If all of the above answers are NO, then may proceed with Cephalosporin use.    Cheese Other (See Comments)    Breaks face out   Latex Itching and Swelling   Sulfa Antibiotics Swelling    HOME MEDICATIONS:  Current Outpatient Medications:    albuterol  (VENTOLIN  HFA) 108 (90 Base) MCG/ACT inhaler, Inhale 1-2 puffs into the lungs every 6 (six) hours as needed for wheezing or shortness of breath., Disp: 18 g, Rfl: 11   busPIRone  (BUSPAR ) 5 MG tablet, Take 1 tablet (5 mg total) by mouth 3 (three) times daily., Disp: 90 tablet, Rfl: 0   cetirizine  (ZYRTEC )  10 MG tablet, Take 1 tablet (10 mg total) by mouth daily., Disp: 30 tablet, Rfl: 0   clindamycin  (CLEOCIN ) 300 MG capsule, Take 300 mg by mouth every 6 (six) hours., Disp: , Rfl:    cyclobenzaprine  (FLEXERIL ) 10 MG tablet, Take 0.5-1 tablets (5-10 mg total) by mouth 3 (three) times daily as needed., Disp: 30 tablet, Rfl: 0   Doxylamine -Pyridoxine  (DICLEGIS ) 10-10 MG TBEC, Take 1 tablet by mouth See admin instructions. (Diclegis (R)) Initial, 2 tablets (doxylamine  succinate 10 mg/pyridoxine  hydrochloride 10 mg per tablet) orally at bedtime on day 1 and 2; if symptoms persist, take 1 tablet in morning and 2 tablets at bedtime on day 3; if symptoms persist, may increase to MAX 4 tablets per day, administered as 1 tablet in the morning, 1 tablet in mid-afternoon and 2 tablets at bedtime, Disp: 60 tablet, Rfl: 0   EPINEPHrine  (EPIPEN  2-PAK) 0.3 mg/0.3 mL IJ SOAJ injection, Inject 0.3 mg into the muscle once as needed (for severe allergic reaction). CAll 911 immediately if you have to use this medicine, Disp: 2 each, Rfl: 1   gabapentin  (NEURONTIN ) 100 MG capsule, Take 2 capsules (200 mg total) by mouth at bedtime., Disp: 180 capsule, Rfl: 0   hydrOXYzine  (VISTARIL ) 25 MG capsule, TAKE 1 CAPSULE (25 MG TOTAL) BY MOUTH AT BEDTIME AS NEEDED FOR ANXIETY., Disp: 90 capsule, Rfl: 0    lamoTRIgine  (LAMICTAL ) 200 MG tablet, Take 1 tablet (200 mg total) by mouth 3 (three) times daily., Disp: 90 tablet, Rfl: 0   levETIRAcetam  (KEPPRA ) 750 MG tablet, Take 2 tablets (1,500 mg total) by mouth 2 (two) times daily., Disp: 120 tablet, Rfl: 0   MICONAZOLE NITRATE VAGINAL 4 % CREA, Place 1 applicator vaginally at bedtime for 3 days., Disp: 25 g, Rfl: 0   ondansetron  (ZOFRAN -ODT) 4 MG disintegrating tablet, Take 1-2 tablets (4-8 mg total) by mouth every 8 (eight) hours as needed for nausea or vomiting., Disp: 20 tablet, Rfl: 0   polyethylene glycol powder (MIRALAX ) 17 GM/SCOOP powder, Take 17 g by mouth 2 (two) times daily. Dissolve 1 capful (17g) in 4-8 ounces of liquid and take by mouth daily., Disp: 238 g, Rfl: 0   sertraline  (ZOLOFT ) 100 MG tablet, Take 1 tablet (100 mg total) by mouth daily., Disp: 90 tablet, Rfl: 1   SYMBICORT  80-4.5 MCG/ACT inhaler, Inhale 2 puffs into the lungs 2 (two) times daily as needed (shortness of breath)., Disp: 10.2 g, Rfl: 10  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   started at age 57   Headache    Obesity    Pneumonia    Postpartum depression, postpartum condition 08/11/2021   Seizures (HCC)    when I get too hot   Trichomonas infection    Umbilical hernia 04/2022   UTI (lower urinary tract infection)    Vulvar abscess 08/11/2021    PAST SURGICAL HISTORY: Past  Surgical History:  Procedure Laterality Date    COLONOSCOPY N/A 05/22/2023   Procedure: COLONOSCOPY;  Surgeon: Therisa Bi, MD;  Location: [facility] ENDOSCOPY;  Service: Gastroenterology;  Laterality: N/A;   DILATION AND EVACUATION N/A 08/09/2013   Procedure: DILATATION AND EVACUATION;  Surgeon: Aida DELENA Na, MD;  Location: [facility] ORS;  Service: Gynecology;  Laterality: N/A;   DILATION AND EVACUATION  2017   ESOPHAGOGASTRODUODENOSCOPY (EGD) WITH PROPOFOL  N/A 11/10/2022   Procedure: ESOPHAGOGASTRODUODENOSCOPY (EGD) WITH PROPOFOL ;  Surgeon: Therisa Bi, MD;  Location: [facility] ENDOSCOPY;  Service: Gastroenterology;  Laterality: N/A;   EUSTACHIAN TUBE DILATION     EXCISION MASS NECK N/A 11/16/2022   Procedure: EXCISION MASS NECK;  Surgeon: Jordis Laneta FALCON, MD;  Location: [facility] ORS;  Service: General;  Laterality: N/A;  JP drain placed   LACERATION REPAIR Left 2007   5th toe   ORTHOPEDIC SURGERY Left 2010   hand fracture; screws in place   TONSILLECTOMY AND ADENOIDECTOMY  2001   TYMPANOSTOMY TUBE PLACEMENT Bilateral 2002   UMBILICAL HERNIA REPAIR N/A 05/12/2022   Procedure: LAPAROSCOPIC ASSISTED SUPRA UMBILICAL HERNIA REPAIR;  Surgeon: Gladis Cough, MD;  Location: [facility] ORS;  Service: General;  Laterality: N/A;    FAMILY HISTORY: Family History  Problem Relation Age of Onset   Depression Mother    Heart disease Mother    Cancer Mother        Bone cancer   Seizures Mother    Stroke Mother    Sickle cell anemia Mother    Thyroid  disease Mother    Heart disease Father    Hypertension Father    Seizures Maternal Aunt    Diabetes Maternal Grandmother    Colon cancer Maternal Grandmother    Hypertension Maternal Grandmother    Clotting disorder Maternal Grandmother    Breast cancer Maternal Grandmother    Stroke Maternal Grandmother    Diabetes Paternal Grandmother     SOCIAL HISTORY:  Social History   Socioeconomic History   Marital status: Single    Spouse name: Surveyor, mining   Number of children: 2   Years of education: Not on file    Highest education level: Associate degree: academic program  Occupational History   Not on file  Tobacco Use   Smoking status: Never    Passive exposure: Never   Smokeless tobacco: Never  Vaping Use   Vaping status: Never Used  Substance and Sexual Activity   Alcohol use: Not Currently    Comment: occassional   Drug use: No   Sexual activity: Not Currently    Partners: Male  Other Topics Concern   Not on file  Social History Narrative   Caffeine : tea sometimes   Lives with fiance   Right handed    Social Drivers of Health   Financial Resource Strain: Low Risk  (11/24/2022)   Overall Financial Resource Strain (CARDIA)    Difficulty of Paying Living Expenses: Not hard at all  Food Insecurity: No Food Insecurity (11/24/2022)   Hunger Vital Sign    Worried About Radiation protection practitioner of Food in the Last Year: Never true    Ran Out of Food in the Last Year: Never true  Transportation Needs: No Transportation Needs (11/24/2022)   PRAPARE - Administrator, Civil Service (Medical): No    Lack of Transportation (Non-Medical): No  Physical Activity: Insufficiently Active (11/24/2022)   Exercise Vital Sign    Days of Exercise per Week: 5 days    Minutes of Exercise per  Session: 20 min  Stress: No Stress Concern Present (11/24/2022)   [employer] of Occupational Health - Occupational Stress Questionnaire    Feeling of Stress : Not at all  Social Connections: Unknown (11/24/2022)   Social Connection and Isolation Panel    Frequency of Communication with Friends and Family: Patient declined    Frequency of Social Gatherings with Friends and Family: Patient declined    Attends Religious Services: Patient declined    Database administrator or Organizations: No    Attends Engineer, structural: Not on file    Marital Status: Never married  Intimate Partner Violence: Not At Risk (08/14/2018)   Humiliation, Afraid, Rape, and Kick questionnaire    Fear of Current or  Ex-Partner: No    Emotionally Abused: No    Physically Abused: No    Sexually Abused: No     PHYSICAL EXAM  Vitals:   10/11/23 1032  BP: 113/80  Pulse: (!) 112  Weight: (!) 331 lb 8 oz (150.4 kg)  Height: 5' 5 (1.651 m)    Body mass index is 55.16 kg/m.   General: The patient is well-developed and well-nourished and in no acute distress  HEENT:  Head is [place]/AT.  Sclera are anicteric.    Skin: Extremities are without rash or  edema.  Neurologic Exam  Mental status: The patient is alert and oriented x 3 at the time of the examination. The patient has apparent normal recent and remote memory, with an apparently normal attention span and concentration ability.   Speech is normal.  Cranial nerves: Extraocular movements are full. Facial strength and sensation are normal.    No obvious hearing deficits are noted.  Motor:  Muscle bulk is normal.   Tone is normal. Strength is  4+/5 right ulnar innervated hand muscles and 5 / 5 elsewhere Sensory: Decreased sensation in right ulnar hand distribution (1/2 digit 4 and all palmar digit 5).   No Tinel's sign  Coordination: Cerebellar testing reveals good finger-nose-finger and heel-to-shin bilaterally.  Gait and station: Station is normal.   Gait is limping due to a left boot.   Romberg is negative  Reflexes: Deep tendon reflexes are symmetric and normal bilaterally.       DIAGNOSTIC DATA (LABS, IMAGING, TESTING) - I reviewed patient records, labs, notes, testing and imaging myself where available.  Lab Results  Component Value Date   WBC 7.8 10/08/2023   HGB 11.9 (L) 10/08/2023   HCT 36.3 10/08/2023   MCV 86.6 10/08/2023   PLT 361 10/08/2023      Component Value Date/Time   NA 140 09/30/2023 1449   NA 140 08/21/2023 1046   K 4.0 09/30/2023 1449   CL 103 09/30/2023 1449   CO2 27 09/30/2023 1449   GLUCOSE 97 09/30/2023 1449   BUN 7 09/30/2023 1449   BUN 9 08/21/2023 1046   CREATININE 0.71 09/30/2023 1449   CREATININE  0.71 09/24/2023 1353   CALCIUM  9.2 09/30/2023 1449   PROT 7.1 09/30/2023 1449   PROT 6.7 08/21/2023 1046   ALBUMIN 4.3 09/30/2023 1449   ALBUMIN 4.3 08/21/2023 1046   AST 20 09/30/2023 1449   AST 13 (L) 09/24/2023 1353   ALT 18 09/30/2023 1449   ALT 13 09/24/2023 1353   ALKPHOS 99 09/30/2023 1449   BILITOT 0.3 09/30/2023 1449   BILITOT 0.3 09/24/2023 1353   GFRNONAA >60 09/30/2023 1449   GFRNONAA >60 09/24/2023 1353   GFRAA >60 02/20/2019 2329   Lab  Results  Component Value Date   CHOL 163 04/19/2023   HDL 49 04/19/2023   LDLCALC 100 (H) 04/19/2023   TRIG 70 04/19/2023   CHOLHDL 3.3 04/19/2023   Lab Results  Component Value Date   HGBA1C 5.8 (H) 04/19/2023   Lab Results  Component Value Date   VITAMINB12 269 04/19/2023   Lab Results  Component Value Date   TSH 1.150 04/19/2023       ASSESSMENT AND PLAN  1. 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 Chronic migraine without aura without status migrainosus, not intractable   3. Snoring   4. Morning headache   5. Right wrist pain   6. Numbness and tingling in right hand      She will continue Keppra  1500 mg twice daily.  Lamotrigine  200 mg po tid.   She had breakthrough seizures on lower dose.   2.  Stay active and exercise as tolerated.      Continue prenatal care with OB 3.   Avoid tramadol  (has some but not taken recently).     Consider an anti-CGRP agent monthly after delivery  4.    She will return to us  in 6 months but call if she has new or worsening neurologic symptoms.   Oaklee Esther A. Vear, MD, [facility] 10/11/2023, 7:54 PM Certified in Neurology, Clinical Neurophysiology, Sleep Medicine and Neuroimaging  [facility] [address] [place], [place] [address] [phone]

## 2023-10-11 NOTE — Telephone Encounter (Signed)
 NCV/EMG order faxed to [facility] for scheduling (fax# [phone], phone# [phone])

## 2023-10-12 NOTE — Progress Notes (Signed)
 Not seen

## 2023-10-16 ENCOUNTER — Ambulatory Visit (INDEPENDENT_AMBULATORY_CARE_PROVIDER_SITE_OTHER): Payer: Self-pay

## 2023-10-16 ENCOUNTER — Other Ambulatory Visit: Payer: Self-pay

## 2023-10-16 ENCOUNTER — Other Ambulatory Visit: Payer: Self-pay | Admitting: Family Medicine

## 2023-10-16 ENCOUNTER — Encounter: Payer: Self-pay | Admitting: Family Medicine

## 2023-10-16 ENCOUNTER — Encounter: Payer: Self-pay | Admitting: Neurology

## 2023-10-16 DIAGNOSIS — Z3491 Encounter for supervision of normal pregnancy, unspecified, first trimester: Secondary | ICD-10-CM

## 2023-10-16 DIAGNOSIS — Z3A01 Less than 8 weeks gestation of pregnancy: Secondary | ICD-10-CM | POA: Diagnosis not present

## 2023-10-16 DIAGNOSIS — O3680X0 Pregnancy with inconclusive fetal viability, not applicable or unspecified: Secondary | ICD-10-CM

## 2023-10-17 ENCOUNTER — Other Ambulatory Visit: Payer: Self-pay

## 2023-10-17 ENCOUNTER — Encounter: Payer: Self-pay | Admitting: *Deleted

## 2023-10-17 ENCOUNTER — Inpatient Hospital Stay (HOSPITAL_COMMUNITY)
Admission: AD | Admit: 2023-10-17 | Discharge: 2023-10-17 | Disposition: A | Discharge status: Home / Self Care | Payer: Self-pay

## 2023-10-17 DIAGNOSIS — O219 Vomiting of pregnancy, unspecified: Secondary | ICD-10-CM | POA: Diagnosis not present

## 2023-10-17 DIAGNOSIS — R112 Nausea with vomiting, unspecified: Secondary | ICD-10-CM

## 2023-10-17 DIAGNOSIS — Z3A01 Less than 8 weeks gestation of pregnancy: Secondary | ICD-10-CM | POA: Diagnosis not present

## 2023-10-17 LAB — URINALYSIS, ROUTINE W REFLEX MICROSCOPIC
Bilirubin Urine: NEGATIVE
Glucose, UA: NEGATIVE mg/dL
Ketones, ur: NEGATIVE mg/dL
Leukocytes,Ua: NEGATIVE
Nitrite: NEGATIVE
Protein, ur: NEGATIVE mg/dL
Specific Gravity, Urine: 1.02 (ref 1.005–1.030)
pH: 6 (ref 5.0–8.0)

## 2023-10-17 MED ORDER — ONDANSETRON 4 MG PO TBDP
4.0000 mg | ORAL_TABLET | Freq: Three times a day (TID) | ORAL | 0 refills | Status: AC | PRN
  Filled 2023-10-17 – 2023-11-21 (×3): qty 20, 7d supply, fill #0

## 2023-10-17 MED ORDER — ONDANSETRON 4 MG PO TBDP
4.0000 mg | ORAL_TABLET | Freq: Once | ORAL | Status: AC
  Administered 2023-10-17: 4 mg via ORAL
  Filled 2023-10-17: qty 1

## 2023-10-17 MED ORDER — METOCLOPRAMIDE HCL 10 MG PO TABS
10.0000 mg | ORAL_TABLET | Freq: Three times a day (TID) | ORAL | 0 refills | Status: DC
  Filled 2023-10-17: qty 90, 30d supply, fill #0

## 2023-10-17 NOTE — Discharge Instructions (Signed)
 Medications:  Please take Reglan  as a controller medication three times a day before you eat.  Take Diclegis  as a controller medication before bedtime  Take Zofran  every 8 hours as needed for breakthrough nausea/vomiting symptoms.

## 2023-10-17 NOTE — MAU Note (Signed)
 Latoya Stark is a 28 y.o. at [redacted]w[redacted]d here in MAU reporting: she hasn't been able to keep any food down but tolerating liquids.  States feels nauseated despite taken prescribed meds.   Reports ultrasound performed yesterday and fetus seen in uterus with cardiac activity.  LMP: 09/03/2023 Onset of complaint: yesterday Pain score: 0 Vitals:   10/17/23 0942  BP: 111/68  Pulse: 83  Resp: 20  Temp: (!) 97.5 F (36.4 C)  SpO2: 98%     FHT: NA  Lab orders placed from triage: UA

## 2023-10-17 NOTE — MAU Note (Cosign Needed)
 Maternal Assessment Unit Provider Note  Subjective: [name] is a 28 y.o. [phone] pregnant female at [redacted]w[redacted]d who presents to MAU today with complaint of nausea/vomiting in pregnancy.   She reports nausea and vomiting which started yesterday and continued through today.  No p.o. intake of solids.  She has been tolerating some liquids with the use of home p.o nausea medicines.  She feels better now after Zofran .  No vaginal bleeding, no back pain, or abdominal pain  Pertinent items noted in HPI and remainder of comprehensive ROS otherwise negative.   Objective: BP 121/81 (BP Location: Right Arm)   Pulse 83   Temp 98.7 F (37.1 C) (Oral)   Resp 20   Ht 5' 5 (1.651 m)   Wt (!) 150 kg   LMP 09/03/2023 (Approximate)   SpO2 100%   BMI 55.01 kg/m  Physical Exam Vitals reviewed.  Constitutional:      General: She is not in acute distress.    Appearance: She is well-developed. She is not diaphoretic.  Eyes:     General: No scleral icterus. Cardiovascular:     Rate and Rhythm: Normal rate and regular rhythm.  Pulmonary:     Effort: Pulmonary effort is normal. No respiratory distress.  Abdominal:     General: There is no distension.     Palpations: Abdomen is soft.     Tenderness: There is no abdominal tenderness. There is no guarding or rebound.  Skin:    General: Skin is warm and dry.  Neurological:     General: No focal deficit present.     Mental Status: She is alert.  Psychiatric:        Behavior: Behavior normal.      MDM: low risk  MAU Course:  Time: 12:44 PM  VSS and normal Feeling better after Zofran . Discussed medication regimen. Reglan  and Diclegis  for controller medication and Zofran  for breakthrough. Discussed possible risk of congenital defects/cleft palate concern with Zofran  use in early pregnancy, but in this instance benefits outweigh risks.  Pt tolerated PO intake of solids and liquids prior to discharge.   Questions were answered to the  satisfaction of the patient and/or family prior to discharge.   Assessment Medical screening exam complete    ICD-10-CM   1. Nausea/vomiting in pregnancy  O21.9     2. Nausea and vomiting, unspecified vomiting type  R11.2 ondansetron  (ZOFRAN -ODT) 4 MG disintegrating tablet     Plan -feeling better with Zofran  -tolerating PO liquids/solids before discharge -reglan /diclegis  for controller medication, zofran  for breakthrough  Discharge from MAU in stable condition with strict return precautions Follow up with your OB as scheduled for ongoing prenatal care  Allergies as of 10/17/2023       Reactions   Hydrocodone  Hives   Milk-related Compounds Dermatitis   Breaks face out *can have 2% milk*   Mushroom Extract Complex (obsolete) Anaphylaxis   Penicillins Anaphylaxis   Has patient had a PCN reaction causing immediate rash, facial/tongue/throat swelling, SOB or lightheadedness with hypotension: Yes Has patient had a PCN reaction causing severe rash involving mucus membranes or skin necrosis: No Has patient had a PCN reaction that required hospitalization No Has patient had a PCN reaction occurring within the last 10 years: Yes If all of the above answers are NO, then may proceed with Cephalosporin use.   Cheese Other (See Comments)   Breaks face out   Latex Itching, Swelling   Sulfa Antibiotics Swelling  Medication List     STOP taking these medications    clindamycin  300 MG capsule Commonly known as: CLEOCIN    cyclobenzaprine  10 MG tablet Commonly known as: FLEXERIL        TAKE these medications    busPIRone  5 MG tablet Commonly known as: BUSPAR  Take 1 tablet (5 mg total) by mouth 3 (three) times daily.   cetirizine  10 MG tablet Commonly known as: ZYRTEC  Take 1 tablet (10 mg total) by mouth daily.   Doxylamine -Pyridoxine  10-10 MG Tbec Commonly known as: Diclegis  Take 1 tablet by mouth See admin instructions. (Diclegis (R)) Initial, 2 tablets  (doxylamine  succinate 10 mg/pyridoxine  hydrochloride 10 mg per tablet) orally at bedtime on day 1 and 2; if symptoms persist, take 1 tablet in morning and 2 tablets at bedtime on day 3; if symptoms persist, may increase to MAX 4 tablets per day, administered as 1 tablet in the morning, 1 tablet in mid-afternoon and 2 tablets at bedtime   EPINEPHrine  0.3 mg/0.3 mL Soaj injection Commonly known as: EpiPen  2-Pak Inject 0.3 mg into the muscle once as needed (for severe allergic reaction). CAll 911 immediately if you have to use this medicine   gabapentin  100 MG capsule Commonly known as: NEURONTIN  Take 2 capsules (200 mg total) by mouth at bedtime.   hydrOXYzine  25 MG capsule Commonly known as: VISTARIL  TAKE 1 CAPSULE (25 MG TOTAL) BY MOUTH AT BEDTIME AS NEEDED FOR ANXIETY.   lamoTRIgine  200 MG tablet Commonly known as: LAMICTAL  Take 1 tablet (200 mg total) by mouth 3 (three) times daily.   levETIRAcetam  750 MG tablet Commonly known as: KEPPRA  Take 2 tablets (1,500 mg total) by mouth 2 (two) times daily.   metoCLOPramide  10 MG tablet Commonly known as: Reglan  Take 1 tablet (10 mg total) by mouth 3 (three) times daily before meals.   ondansetron  4 MG disintegrating tablet Commonly known as: ZOFRAN -ODT Take 1 tablet (4 mg total) by mouth every 8 (eight) hours as needed for refractory nausea / vomiting. What changed:  how much to take reasons to take this   polyethylene glycol powder 17 GM/SCOOP powder Commonly known as: MiraLax  Take 17 g by mouth 2 (two) times daily. Dissolve 1 capful (17g) in 4-8 ounces of liquid and take by mouth daily.   sertraline  100 MG tablet Commonly known as: ZOLOFT  Take 1 tablet (100 mg total) by mouth daily.   Symbicort  80-4.5 MCG/ACT inhaler Generic drug: budesonide -formoterol  Inhale 2 puffs into the lungs 2 (two) times daily as needed (shortness of breath).   Ventolin  HFA 108 (90 Base) MCG/ACT inhaler Generic drug: albuterol  Inhale 1-2 puffs into  the lungs every 6 (six) hours as needed for wheezing or shortness of breath.        Trudy Leeroy NOVAK, MD 10/17/2023 12:44 PM

## 2023-10-18 LAB — CULTURE, OB URINE: Culture: 70000 — AB

## 2023-10-19 ENCOUNTER — Encounter: Payer: Self-pay | Admitting: Family Medicine

## 2023-10-19 ENCOUNTER — Ambulatory Visit: Payer: Self-pay | Admitting: Nurse Practitioner

## 2023-10-19 NOTE — Progress Notes (Unsigned)
 Urine culture positive for diptheroids. This can be normal skin flora, patient presented for nausea/vomiting and had no urinary complaints at the time.  Discussed with Dr. Alger, recommends repeating urine culture and ensuring adequate cleaning for clean catch sample to be analyzed.

## 2023-10-23 ENCOUNTER — Encounter: Payer: Self-pay | Admitting: Physician Assistant

## 2023-10-23 NOTE — Progress Notes (Signed)
 Opened in error

## 2023-10-24 ENCOUNTER — Inpatient Hospital Stay (HOSPITAL_COMMUNITY)
Admission: AD | Admit: 2023-10-24 | Discharge: 2023-10-24 | Disposition: A | Discharge status: Home / Self Care | Payer: Self-pay | Attending: Obstetrics and Gynecology | Admitting: Obstetrics and Gynecology

## 2023-10-24 DIAGNOSIS — O219 Vomiting of pregnancy, unspecified: Secondary | ICD-10-CM

## 2023-10-24 DIAGNOSIS — R200 Anesthesia of skin: Secondary | ICD-10-CM | POA: Insufficient documentation

## 2023-10-24 DIAGNOSIS — Z3A01 Less than 8 weeks gestation of pregnancy: Secondary | ICD-10-CM | POA: Diagnosis not present

## 2023-10-24 LAB — URINALYSIS, ROUTINE W REFLEX MICROSCOPIC
Bilirubin Urine: NEGATIVE
Glucose, UA: NEGATIVE mg/dL
Hgb urine dipstick: NEGATIVE
Ketones, ur: NEGATIVE mg/dL
Nitrite: NEGATIVE
Protein, ur: NEGATIVE mg/dL
Specific Gravity, Urine: 1.017 (ref 1.005–1.030)
pH: 6 (ref 5.0–8.0)

## 2023-10-24 LAB — WET PREP, GENITAL
Clue Cells Wet Prep HPF POC: NONE SEEN
Sperm: NONE SEEN
Trich, Wet Prep: NONE SEEN
WBC, Wet Prep HPF POC: <10 (ref <10)
Yeast Wet Prep HPF POC: NONE SEEN

## 2023-10-24 MED ORDER — LACTATED RINGERS IV BOLUS: 1000.0000 mL | Freq: Once | INTRAVENOUS | Status: DC

## 2023-10-24 MED ORDER — ACETAMINOPHEN 500 MG PO TABS
1000.0000 mg | ORAL_TABLET | Freq: Once | ORAL | Status: AC
  Administered 2023-10-24: 1000 mg via ORAL
  Filled 2023-10-24: qty 2

## 2023-10-24 MED ORDER — METOCLOPRAMIDE HCL 5 MG/ML IJ SOLN: 10.0000 mg | Freq: Once | INTRAMUSCULAR | Status: DC

## 2023-10-24 MED ORDER — ONDANSETRON HCL 4 MG PO TABS
4.0000 mg | ORAL_TABLET | Freq: Once | ORAL | Status: AC
  Administered 2023-10-24: 4 mg via ORAL
  Filled 2023-10-24: qty 1
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
## 2023-10-24 NOTE — MAU Provider Note (Signed)
 History     [id number]  Arrival date and time: 10/24/23 0009    Chief Complaint  Patient presents with   Emesis During Pregnancy   Abdominal Pain     HPI Latoya Stark is a 28 y.o. at [redacted]w[redacted]d by LMP, who presents for nausea.  She was seen in the MAU on 10/8 for similar symptoms. She states that she ran out of zofran  and cannot afford anymore. It was working for her. The main reason she came to the MAU was because she needed a doctor's note because she keeps getting sent home from work due to the persistent nausea and vomiting as well as running out of medication.    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   started at age 88   Headache    Obesity    Pneumonia    Postpartum depression, postpartum condition 08/11/2021   Seizures (HCC)    when I get too hot   Trichomonas infection    Umbilical hernia 04/2022   UTI (lower urinary tract infection)    Vulvar abscess 08/11/2021    Past Surgical History:  Procedure Laterality Date   COLONOSCOPY N/A 05/22/2023   Procedure: COLONOSCOPY;  Surgeon: Therisa Bi, MD;  Location[facility] ENDOSCOPY;  Service: Gastroenterology;  Laterality: N/A;   DILATION AND EVACUATION N/A 08/09/2013   Procedure: DILATATION AND EVACUATION;  Surgeon: Aida DELENA Na, MD;  Location: [facility] ORS;  Service: Gynecology;  Laterality: N/A;   DILATION AND EVACUATION  2017   ESOPHAGOGASTRODUODENOSCOPY (EGD) WITH PROPOFOL   N/A 11/10/2022   Procedure: ESOPHAGOGASTRODUODENOSCOPY (EGD) WITH PROPOFOL ;  Surgeon: Therisa Bi, MD;  Location: [facility] ENDOSCOPY;  Service: Gastroenterology;  Laterality: N/A;   EUSTACHIAN TUBE DILATION     EXCISION MASS NECK N/A 11/16/2022   Procedure: EXCISION MASS NECK;  Surgeon: Jordis Laneta FALCON, MD;  Location: [facility] ORS;  Service: General;  Laterality: N/A;  JP drain placed   LACERATION REPAIR Left 2007   5th toe   ORTHOPEDIC SURGERY Left 2010   hand fracture; screws in place   TONSILLECTOMY AND ADENOIDECTOMY  2001   TYMPANOSTOMY TUBE PLACEMENT Bilateral 2002   UMBILICAL HERNIA REPAIR N/A 05/12/2022   Procedure: LAPAROSCOPIC ASSISTED SUPRA UMBILICAL HERNIA REPAIR;  Surgeon: Gladis Cough, MD;  Location: [facility] ORS;  Service: General;  Laterality: N/A;    Family History  Problem Relation Age of Onset   Depression Mother    Heart disease Mother    Cancer Mother        Bone cancer   Seizures Mother    Stroke Mother    Sickle cell anemia Mother    Thyroid  disease Mother    Heart disease Father    Hypertension Father    Seizures Maternal Aunt    Diabetes Maternal Grandmother    Colon cancer Maternal Grandmother    Hypertension Maternal Grandmother    Clotting  disorder Maternal Grandmother    Breast cancer Maternal Grandmother    Stroke Maternal Grandmother    Diabetes Paternal Grandmother     Social History   Socioeconomic History   Marital status: Single    Spouse name: Surveyor, mining   Number of children: 2   Years of education: Not on file   Highest education level: Associate degree: academic program  Occupational History   Not on file  Tobacco Use   Smoking status: Never    Passive exposure: Never   Smokeless tobacco: Never  Vaping Use   Vaping status: Never Used  Substance and Sexual Activity   Alcohol use: Not Currently    Comment: occassional   Drug use: No   Sexual activity: Not Currently    Partners: Male  Other Topics Concern   Not on file  Social History  Narrative   Caffeine : tea sometimes   Lives with fiance   Right handed    Social Drivers of Health   Financial Resource Strain: Low Risk  (11/24/2022)   Overall Financial Resource Strain (CARDIA)    Difficulty of Paying Living Expenses: Not hard at all  Food Insecurity: No Food Insecurity (11/24/2022)   Hunger Vital Sign    Worried About Running Out of Food in the Last Year: Never true    Ran Out of Food in the Last Year: Never true  Transportation Needs: No Transportation Needs (11/24/2022)   PRAPARE - Administrator, Civil Service (Medical): No    Lack of Transportation (Non-Medical): No  Physical Activity: Insufficiently Active (11/24/2022)   Exercise Vital Sign    Days of Exercise per Week: 5 days    Minutes of Exercise per Session: 20 min  Stress: No Stress Concern Present (11/24/2022)   [employer] of Occupational Health - Occupational Stress Questionnaire    Feeling of Stress : Not at all  Social Connections: Unknown (11/24/2022)   Social Connection and Isolation Panel    Frequency of Communication with Friends and Family: Patient declined    Frequency of Social Gatherings with Friends and Family: Patient declined    Attends Religious Services: Patient declined    Database administrator or Organizations: No    Attends Engineer, structural: Not on file    Marital Status: Never married  Intimate Partner Violence: Not At Risk (08/14/2018)   Humiliation, Afraid, Rape, and Kick questionnaire    Fear of Current or Ex-Partner: No    Emotionally Abused: No    Physically Abused: No    Sexually Abused: No    Allergies  Allergen Reactions   Hydrocodone  Hives   Milk-Related Compounds Dermatitis    Breaks face out *can have 2% milk*   Mushroom Extract Complex (Obsolete) Anaphylaxis   Penicillins Anaphylaxis    Has patient had a PCN reaction causing immediate rash, facial/tongue/throat swelling, SOB or lightheadedness with hypotension: Yes Has  patient had a PCN reaction causing severe rash involving mucus membranes or skin necrosis: No Has patient had a PCN reaction that required hospitalization No Has patient had a PCN reaction occurring within the last 10 years: Yes If all of the above answers are NO, then may proceed with Cephalosporin use.    Cheese Other (See Comments)    Breaks face out   Latex Itching and Swelling   Sulfa Antibiotics Swelling    No current facility-administered medications on file prior to encounter.   Current Outpatient Medications on File Prior to Encounter  Medication Sig  Dispense Refill   albuterol  (VENTOLIN  HFA) 108 (90 Base) MCG/ACT inhaler Inhale 1-2 puffs into the lungs every 6 (six) hours as needed for wheezing or shortness of breath. 18 g 11   busPIRone  (BUSPAR ) 5 MG tablet Take 1 tablet (5 mg total) by mouth 3 (three) times daily. 90 tablet 0   cetirizine  (ZYRTEC ) 10 MG tablet Take 1 tablet (10 mg total) by mouth daily. 30 tablet 0   Doxylamine -Pyridoxine  (DICLEGIS ) 10-10 MG TBEC Take 1 tablet by mouth See admin instructions. (Diclegis (R)) Initial, 2 tablets (doxylamine  succinate 10 mg/pyridoxine  hydrochloride 10 mg per tablet) orally at bedtime on day 1 and 2; if symptoms persist, take 1 tablet in morning and 2 tablets at bedtime on day 3; if symptoms persist, may increase to MAX 4 tablets per day, administered as 1 tablet in the morning, 1 tablet in mid-afternoon and 2 tablets at bedtime 60 tablet 0   EPINEPHrine  (EPIPEN  2-PAK) 0.3 mg/0.3 mL IJ SOAJ injection Inject 0.3 mg into the muscle once as needed (for severe allergic reaction). CAll 911 immediately if you have to use this medicine 2 each 1   gabapentin  (NEURONTIN ) 100 MG capsule Take 2 capsules (200 mg total) by mouth at bedtime. 180 capsule 0   hydrOXYzine  (VISTARIL ) 25 MG capsule TAKE 1 CAPSULE (25 MG TOTAL) BY MOUTH AT BEDTIME AS NEEDED FOR ANXIETY. 90 capsule 0   lamoTRIgine  (LAMICTAL ) 200 MG tablet Take 1 tablet (200 mg total) by  mouth 3 (three) times daily. 90 tablet 0   levETIRAcetam  (KEPPRA ) 750 MG tablet Take 2 tablets (1,500 mg total) by mouth 2 (two) times daily. 120 tablet 0   metoCLOPramide  (REGLAN ) 10 MG tablet Take 1 tablet (10 mg total) by mouth 3 (three) times daily before meals. 90 tablet 0   ondansetron  (ZOFRAN -ODT) 4 MG disintegrating tablet Take 1 tablet (4 mg total) by mouth every 8 (eight) hours as needed for refractory nausea / vomiting. 20 tablet 0   polyethylene glycol powder (MIRALAX ) 17 GM/SCOOP powder Take 17 g by mouth 2 (two) times daily. Dissolve 1 capful (17g) in 4-8 ounces of liquid and take by mouth daily. 238 g 0   sertraline  (ZOLOFT ) 100 MG tablet Take 1 tablet (100 mg total) by mouth daily. 90 tablet 1   SYMBICORT  80-4.5 MCG/ACT inhaler Inhale 2 puffs into the lungs 2 (two) times daily as needed (shortness of breath). 10.2 g 10    Pertinent positives and negative per HPI, all others reviewed and negative  Physical Exam   BP 137/82   Pulse 94   Temp 98.5 F (36.9 C)   Resp 18   Ht 5' 5 (1.651 m)   Wt (!) 151.5 kg   LMP 09/03/2023 (Approximate)   SpO2 99%   BMI 55.58 kg/m   Patient Vitals for the past 24 hrs:  BP Temp Pulse Resp SpO2 Height Weight  10/24/23 0020 137/82 -- -- -- -- -- --  10/24/23 0019 -- 98.5 F (36.9 C) 94 18 99 % 5' 5 (1.651 m) (!) 151.5 kg    Physical Exam Vitals and nursing note reviewed.  Constitutional:      Appearance: She is well-developed.  HENT:     Head: Normocephalic and atraumatic.     Mouth/Throat:     Mouth: Mucous membranes are moist.  Eyes:     Extraocular Movements: Extraocular movements intact.  Cardiovascular:     Rate and Rhythm: Normal rate and regular rhythm.  Pulmonary:     Effort:  Pulmonary effort is normal.  Abdominal:     Palpations: Abdomen is soft.     Tenderness: There is no abdominal tenderness.  Skin:    Capillary Refill: Capillary refill takes less than 2 seconds.  Neurological:     General: No focal deficit  present.     Mental Status: She is alert.      Labs Results for orders placed or performed during the hospital encounter of 10/24/23 (from the past 24 hours)  Urinalysis, Routine w reflex microscopic -Urine, Clean Catch     Status: Abnormal   Collection Time: 10/24/23 12:28 AM  Result Value Ref Range   Color, Urine YELLOW YELLOW   APPearance HAZY (A) CLEAR   Specific Gravity, Urine 1.017 1.005 - 1.030   pH 6.0 5.0 - 8.0   Glucose, UA NEGATIVE NEGATIVE mg/dL   Hgb urine dipstick NEGATIVE NEGATIVE   Bilirubin Urine NEGATIVE NEGATIVE   Ketones, ur NEGATIVE NEGATIVE mg/dL   Protein, ur NEGATIVE NEGATIVE mg/dL   Nitrite NEGATIVE NEGATIVE   Leukocytes,Ua TRACE (A) NEGATIVE   RBC / HPF 0-5 0 - 5 RBC/hpf   WBC, UA 0-5 0 - 5 WBC/hpf   Bacteria, UA RARE (A) NONE SEEN   Squamous Epithelial / HPF 11-20 0 - 5 /HPF   Mucus PRESENT   Wet prep, genital     Status: None   Collection Time: 10/24/23 12:52 AM  Result Value Ref Range   Yeast Wet Prep HPF POC NONE SEEN NONE SEEN   Trich, Wet Prep NONE SEEN NONE SEEN   Clue Cells Wet Prep HPF POC NONE SEEN NONE SEEN   WBC, Wet Prep HPF POC <10 <10   Sperm NONE SEEN    *Note: Due to a large number of results and/or encounters for the requested time period, some results have not been displayed. A complete set of results can be found in Results Review.    Imaging No results found.  MAU Course  Procedures  Lab Orders         Wet prep, genital         Urinalysis, Routine w reflex microscopic -Urine, Clean Catch    Meds ordered this encounter  Medications   acetaminophen  (TYLENOL ) tablet 1,000 mg   ondansetron  (ZOFRAN ) tablet 4 mg   DISCONTD: metoCLOPramide  (REGLAN ) injection 10 mg   DISCONTD: lactated ringers  bolus 1,000 mL   Imaging Orders  No imaging studies ordered today    MDM Moderate (Level 3-4)  Assessment and Plan  #Nausea and vomiting of pregnancy #[redacted] weeks gestation of pregnancy  Latoya Stark is a 28 y.o. at [redacted]w[redacted]d by  LMP, who presents for nausea.  She was seen in the MAU on 10/8 for similar symptoms.  -Given 1g tylenol  and 4mg  zofran  with good effect in MAU.   -Safe to discharge home. -Paper Rx with GoodRx coupon given to the patient in order to afford Zofran . Patient also ran out of diclegis  and so instructions written on taking the components separately for cheaper cost.  -Education provided on foods to eat in order to decrease nausea/vomiting. Patient admits that she has been eating new foods because she is tired of the same bland foods that keep her from vomiting.  -Work note provided -Patient has initial OB appointment 10/31/23. Encouraged to keep that appointment. -All questions answered, anticipatory guidance and detailed return precautions provided.  Latoya Mader L Margert Edsall, MD/MHA 10/24/23 1:08 AM  Allergies as of 10/24/2023       Reactions  Hydrocodone  Hives   Milk-related Compounds Dermatitis   Breaks face out *can have 2% milk*   Mushroom Extract Complex (obsolete) Anaphylaxis   Penicillins Anaphylaxis   Has patient had a PCN reaction causing immediate rash, facial/tongue/throat swelling, SOB or lightheadedness with hypotension: Yes Has patient had a PCN reaction causing severe rash involving mucus membranes or skin necrosis: No Has patient had a PCN reaction that required hospitalization No Has patient had a PCN reaction occurring within the last 10 years: Yes If all of the above answers are NO, then may proceed with Cephalosporin use.   Cheese Other (See Comments)   Breaks face out   Latex Itching, Swelling   Sulfa Antibiotics Swelling        Medication List     TAKE these medications    busPIRone  5 MG tablet Commonly known as: BUSPAR  Take 1 tablet (5 mg total) by mouth 3 (three) times daily.   cetirizine  10 MG tablet Commonly known as: ZYRTEC  Take 1 tablet (10 mg total) by mouth daily.   Doxylamine -Pyridoxine  10-10 MG Tbec Commonly known as: Diclegis  Take 1 tablet by  mouth See admin instructions. (Diclegis (R)) Initial, 2 tablets (doxylamine  succinate 10 mg/pyridoxine  hydrochloride 10 mg per tablet) orally at bedtime on day 1 and 2; if symptoms persist, take 1 tablet in morning and 2 tablets at bedtime on day 3; if symptoms persist, may increase to MAX 4 tablets per day, administered as 1 tablet in the morning, 1 tablet in mid-afternoon and 2 tablets at bedtime   EPINEPHrine  0.3 mg/0.3 mL Soaj injection Commonly known as: EpiPen  2-Pak Inject 0.3 mg into the muscle once as needed (for severe allergic reaction). CAll 911 immediately if you have to use this medicine   gabapentin  100 MG capsule Commonly known as: NEURONTIN  Take 2 capsules (200 mg total) by mouth at bedtime.   hydrOXYzine  25 MG capsule Commonly known as: VISTARIL  TAKE 1 CAPSULE (25 MG TOTAL) BY MOUTH AT BEDTIME AS NEEDED FOR ANXIETY.   lamoTRIgine  200 MG tablet Commonly known as: LAMICTAL  Take 1 tablet (200 mg total) by mouth 3 (three) times daily.   levETIRAcetam  750 MG tablet Commonly known as: KEPPRA  Take 2 tablets (1,500 mg total) by mouth 2 (two) times daily.   metoCLOPramide  10 MG tablet Commonly known as: Reglan  Take 1 tablet (10 mg total) by mouth 3 (three) times daily before meals.   ondansetron  4 MG disintegrating tablet Commonly known as: ZOFRAN -ODT Take 1 tablet (4 mg total) by mouth every 8 (eight) hours as needed for refractory nausea / vomiting.   polyethylene glycol powder 17 GM/SCOOP powder Commonly known as: MiraLax  Take 17 g by mouth 2 (two) times daily. Dissolve 1 capful (17g) in 4-8 ounces of liquid and take by mouth daily.   sertraline  100 MG tablet Commonly known as: ZOLOFT  Take 1 tablet (100 mg total) by mouth daily.   Symbicort  80-4.5 MCG/ACT inhaler Generic drug: budesonide -formoterol  Inhale 2 puffs into the lungs 2 (two) times daily as needed (shortness of breath).   Ventolin  HFA 108 (90 Base) MCG/ACT inhaler Generic drug: albuterol  Inhale 1-2  puffs into the lungs every 6 (six) hours as needed for wheezing or shortness of breath.

## 2023-10-25 ENCOUNTER — Ambulatory Visit: Payer: Self-pay | Admitting: *Deleted

## 2023-10-29 ENCOUNTER — Other Ambulatory Visit: Payer: Self-pay

## 2023-10-29 ENCOUNTER — Inpatient Hospital Stay (HOSPITAL_COMMUNITY)
Admission: AD | Admit: 2023-10-29 | Discharge: 2023-10-30 | Disposition: A | Discharge status: Home / Self Care | Payer: Self-pay | Attending: Obstetrics & Gynecology | Admitting: Obstetrics & Gynecology

## 2023-10-29 DIAGNOSIS — O209 Hemorrhage in early pregnancy, unspecified: Secondary | ICD-10-CM

## 2023-10-29 DIAGNOSIS — Z3491 Encounter for supervision of normal pregnancy, unspecified, first trimester: Secondary | ICD-10-CM

## 2023-10-29 DIAGNOSIS — Z3A08 8 weeks gestation of pregnancy: Secondary | ICD-10-CM | POA: Diagnosis not present

## 2023-10-29 DIAGNOSIS — O208 Other hemorrhage in early pregnancy: Secondary | ICD-10-CM | POA: Diagnosis present

## 2023-10-29 DIAGNOSIS — R109 Unspecified abdominal pain: Secondary | ICD-10-CM | POA: Insufficient documentation

## 2023-10-29 DIAGNOSIS — O26899 Other specified pregnancy related conditions, unspecified trimester: Secondary | ICD-10-CM

## 2023-10-29 DIAGNOSIS — Z3A09 9 weeks gestation of pregnancy: Secondary | ICD-10-CM | POA: Insufficient documentation

## 2023-10-29 DIAGNOSIS — O26891 Other specified pregnancy related conditions, first trimester: Secondary | ICD-10-CM | POA: Diagnosis not present

## 2023-10-29 LAB — URINALYSIS, ROUTINE W REFLEX MICROSCOPIC
Bilirubin Urine: NEGATIVE
Glucose, UA: NEGATIVE mg/dL
Hgb urine dipstick: NEGATIVE
Ketones, ur: NEGATIVE mg/dL
Leukocytes,Ua: NEGATIVE
Nitrite: NEGATIVE
Protein, ur: NEGATIVE mg/dL
Specific Gravity, Urine: 1.027 (ref 1.005–1.030)
pH: 6 (ref 5.0–8.0)

## 2023-10-29 NOTE — MAU Note (Signed)
..  Latoya Stark is a 29 y.o. at [redacted]w[redacted]d here in MAU reporting: sharp, dull abdominal pain that began 'a while ago' and it worsened last night. Around 1630 took 1,000 mg of tylenol  but it has not helped with the pain. Last night took flexiril and it eased the pain.  Passed two golf-ball sized blood clots today when she went to pee, she wipes and showers and bleeding goes away.   Pain score: 8/10 Vitals:   10/29/23 1949  BP: 118/64  Pulse: 95  Resp: 14  Temp: 98.7 F (37.1 C)  SpO2: 100%     FHT:n/a Lab orders placed from triage:  UA

## 2023-10-30 ENCOUNTER — Inpatient Hospital Stay (HOSPITAL_COMMUNITY)

## 2023-10-30 DIAGNOSIS — Z3A08 8 weeks gestation of pregnancy: Secondary | ICD-10-CM

## 2023-10-30 DIAGNOSIS — O209 Hemorrhage in early pregnancy, unspecified: Secondary | ICD-10-CM

## 2023-10-30 LAB — CBC WITH DIFFERENTIAL/PLATELET
Abs Immature Granulocytes: 0.06 K/uL (ref 0.00–0.07)
Basophils Absolute: 0 K/uL (ref 0.0–0.1)
Basophils Relative: 0 %
Eosinophils Absolute: 0.1 K/uL (ref 0.0–0.5)
Eosinophils Relative: 2 %
HCT: 37.1 % (ref 36.0–46.0)
Hemoglobin: 12 g/dL (ref 12.0–15.0)
Immature Granulocytes: 1 %
Lymphocytes Relative: 24 %
Lymphs Abs: 2.3 K/uL (ref 0.7–4.0)
MCH: 27.8 pg (ref 26.0–34.0)
MCHC: 32.3 g/dL (ref 30.0–36.0)
MCV: 85.9 fL (ref 80.0–100.0)
Monocytes Absolute: 0.7 K/uL (ref 0.1–1.0)
Monocytes Relative: 7 %
Neutro Abs: 6.3 K/uL (ref 1.7–7.7)
Neutrophils Relative %: 66 %
Platelets: 368 K/uL (ref 150–400)
RBC: 4.32 MIL/uL (ref 3.87–5.11)
RDW: 13.2 % (ref 11.5–15.5)
WBC: 9.4 K/uL (ref 4.0–10.5)
nRBC: 0 % (ref 0.0–0.2)

## 2023-10-30 NOTE — MAU Provider Note (Signed)
 Chief Complaint: Back Pain and Abdominal Pain   SUBJECTIVE HPI: Latoya Stark is a 28 y.o. [id number] at [redacted]w[redacted]d who presents to Maternity Admissions reporting abdominal pain and passing 2 golf-ball sized blood clots. Live IUP previously confirmed. No improvement in pain with Tylenol . Pain resoled w/ flexeril .   Associated signs and symptoms: Neg for fever, chills, urinary complaints, GI complaints.   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   started at age 23   Headache    Obesity    Pneumonia    Postpartum depression, postpartum condition 08/11/2021   Seizures (HCC)    when I get too hot   Trichomonas infection    Umbilical hernia 04/2022   UTI (lower urinary tract infection)    Vulvar abscess 08/11/2021   OB History  Gravida Para Term Preterm AB Living  9 2 2  6 2   SAB IAB Ectopic Multiple Live Births  4 1  0 2    # Outcome Date GA Lbr Len/2nd Weight Sex Type Anes PTL Lv  9 Current           8 Term 06/22/21 [redacted]w[redacted]d 03:19 / 00:04 3190 g M Vag-Spont EPI  LIV  7 AB 10/08/19 [redacted]w[redacted]d    SAB     6 Term 03/01/19 [redacted]w[redacted]d / 00:07 3455 g M Vag-Spont EPI  LIV  5 SAB 10/10/14          4 SAB           3 IAB           2 SAB           1 SAB            Past Surgical History:  Procedure Laterality Date   COLONOSCOPY N/A 05/22/2023   Procedure: COLONOSCOPY;  Surgeon: Therisa Bi, MD;  Location: [facility] ENDOSCOPY;  Service: Gastroenterology;  Laterality: N/A;   DILATION AND  EVACUATION N/A 08/09/2013   Procedure: DILATATION AND EVACUATION;  Surgeon: Aida DELENA Na, MD;  Location: [facility] ORS;  Service: Gynecology;  Laterality: N/A;   DILATION AND EVACUATION  2017   ESOPHAGOGASTRODUODENOSCOPY (EGD) WITH PROPOFOL  N/A 11/10/2022   Procedure: ESOPHAGOGASTRODUODENOSCOPY (EGD) WITH PROPOFOL ;  Surgeon: Therisa Bi, MD;  Location: [facility] ENDOSCOPY;  Service: Gastroenterology;  Laterality: N/A;   EUSTACHIAN TUBE DILATION     EXCISION MASS NECK N/A 11/16/2022   Procedure: EXCISION MASS NECK;  Surgeon: Jordis Laneta FALCON, MD;  Location: [facility] ORS;  Service: General;  Laterality: N/A;  JP drain placed   LACERATION REPAIR Left 2007   5th toe   ORTHOPEDIC SURGERY Left 2010   hand fracture; screws in place   TONSILLECTOMY AND ADENOIDECTOMY  2001   TYMPANOSTOMY TUBE PLACEMENT Bilateral 2002   UMBILICAL HERNIA REPAIR N/A 05/12/2022   Procedure: LAPAROSCOPIC ASSISTED SUPRA UMBILICAL HERNIA REPAIR;  Surgeon: Gladis Cough, MD;  Location: [facility] ORS;  Service: General;  Laterality: N/A;   Social History   Socioeconomic History   Marital status: Single    Spouse name: Jermaine   Number of children: 2   Years of education: Not on file   Highest education level: Associate degree: academic program  Occupational History   Not on file  Tobacco Use   Smoking status: Never    Passive exposure: Never   Smokeless tobacco: Never  Vaping Use   Vaping status: Never Used  Substance and Sexual Activity   Alcohol use: Not Currently    Comment: occassional   Drug use: No   Sexual activity: Not Currently    Partners: Male  Other Topics Concern   Not on file  Social History Narrative   Caffeine : tea sometimes   Lives with fiance   Right handed    Social Drivers of Health   Financial Resource Strain: Low Risk  (11/24/2022)   Overall Financial Resource Strain (CARDIA)    Difficulty of Paying Living Expenses: Not hard at all  Food Insecurity: No Food Insecurity (11/24/2022)   Hunger  Vital Sign    Worried About Running Out of Food in the Last Year: Never true    Ran Out of Food in the Last Year: Never true  Transportation Needs: No Transportation Needs (11/24/2022)   PRAPARE - Administrator, Civil Service (Medical): No    Lack of Transportation (Non-Medical): No  Physical Activity: Insufficiently Active (11/24/2022)   Exercise Vital Sign    Days of Exercise per Week: 5 days    Minutes of Exercise per Session: 20 min  Stress: No Stress Concern Present (11/24/2022)   [employer] of Occupational Health - Occupational Stress Questionnaire    Feeling of Stress : Not at all  Social Connections: Unknown (11/24/2022)   Social Connection and Isolation Panel    Frequency of Communication with Friends and Family: Patient declined    Frequency of Social Gatherings with Friends and Family: Patient declined    Attends Religious Services: Patient declined    Database administrator or Organizations: No    Attends Engineer, structural: Not on file    Marital Status: Never married  Intimate Partner Violence: Not At Risk (08/14/2018)   Humiliation, Afraid, Rape, and Kick questionnaire    Fear of Current or Ex-Partner: No    Emotionally Abused: No    Physically Abused: No    Sexually Abused: No   Family History  Problem Relation Age of Onset   Depression Mother    Heart disease Mother    Cancer Mother        Bone cancer   Seizures Mother    Stroke Mother    Sickle cell anemia Mother    Thyroid  disease Mother    Heart disease Father    Hypertension Father    Seizures Maternal Aunt    Diabetes Maternal Grandmother    Colon cancer Maternal Grandmother    Hypertension Maternal Grandmother    Clotting disorder Maternal Grandmother    Breast cancer Maternal Grandmother    Stroke Maternal Grandmother    Diabetes Paternal Grandmother    No current facility-administered medications on file prior to encounter.   Current Outpatient Medications on  File Prior to Encounter  Medication Sig Dispense Refill   ondansetron  (ZOFRAN -ODT) 4 MG disintegrating tablet Take 1 tablet (4 mg total) by mouth every 8 (eight) hours as needed for refractory nausea / vomiting. 20 tablet 0   albuterol  (VENTOLIN  HFA) 108 (  90 Base) MCG/ACT inhaler Inhale 1-2 puffs into the lungs every 6 (six) hours as needed for wheezing or shortness of breath. 18 g 11   busPIRone  (BUSPAR ) 5 MG tablet Take 1 tablet (5 mg total) by mouth 3 (three) times daily. 90 tablet 0   cetirizine  (ZYRTEC ) 10 MG tablet Take 1 tablet (10 mg total) by mouth daily. 30 tablet 0   Doxylamine -Pyridoxine  (DICLEGIS ) 10-10 MG TBEC Take 1 tablet by mouth See admin instructions. (Diclegis (R)) Initial, 2 tablets (doxylamine  succinate 10 mg/pyridoxine  hydrochloride 10 mg per tablet) orally at bedtime on day 1 and 2; if symptoms persist, take 1 tablet in morning and 2 tablets at bedtime on day 3; if symptoms persist, may increase to MAX 4 tablets per day, administered as 1 tablet in the morning, 1 tablet in mid-afternoon and 2 tablets at bedtime 60 tablet 0   EPINEPHrine  (EPIPEN  2-PAK) 0.3 mg/0.3 mL IJ SOAJ injection Inject 0.3 mg into the muscle once as needed (for severe allergic reaction). CAll 911 immediately if you have to use this medicine 2 each 1   gabapentin  (NEURONTIN ) 100 MG capsule Take 2 capsules (200 mg total) by mouth at bedtime. 180 capsule 0   hydrOXYzine  (VISTARIL ) 25 MG capsule TAKE 1 CAPSULE (25 MG TOTAL) BY MOUTH AT BEDTIME AS NEEDED FOR ANXIETY. 90 capsule 0   lamoTRIgine  (LAMICTAL ) 200 MG tablet Take 1 tablet (200 mg total) by mouth 3 (three) times daily. 90 tablet 0   levETIRAcetam  (KEPPRA ) 750 MG tablet Take 2 tablets (1,500 mg total) by mouth 2 (two) times daily. 120 tablet 0   metoCLOPramide  (REGLAN ) 10 MG tablet Take 1 tablet (10 mg total) by mouth 3 (three) times daily before meals. 90 tablet 0   polyethylene glycol powder (MIRALAX ) 17 GM/SCOOP powder Take 17 g by mouth 2 (two) times  daily. Dissolve 1 capful (17g) in 4-8 ounces of liquid and take by mouth daily. 238 g 0   sertraline  (ZOLOFT ) 100 MG tablet Take 1 tablet (100 mg total) by mouth daily. 90 tablet 1   SYMBICORT  80-4.5 MCG/ACT inhaler Inhale 2 puffs into the lungs 2 (two) times daily as needed (shortness of breath). 10.2 g 10   Allergies  Allergen Reactions   Hydrocodone  Hives   Milk-Related Compounds Dermatitis    Breaks face out *can have 2% milk*   Mushroom Extract Complex (Obsolete) Anaphylaxis   Penicillins Anaphylaxis    Has patient had a PCN reaction causing immediate rash, facial/tongue/throat swelling, SOB or lightheadedness with hypotension: Yes Has patient had a PCN reaction causing severe rash involving mucus membranes or skin necrosis: No Has patient had a PCN reaction that required hospitalization No Has patient had a PCN reaction occurring within the last 10 years: Yes If all of the above answers are NO, then may proceed with Cephalosporin use.    Cheese Other (See Comments)    Breaks face out   Latex Itching and Swelling   Sulfa Antibiotics Swelling    I have reviewed patient's Past Medical Hx, Surgical Hx, Family Hx, Social Hx, medications and allergies.   Review of Systems  All other systems reviewed and are negative.   OBJECTIVE Patient Vitals for the past 24 hrs:  BP Temp Temp src Pulse Resp SpO2 Height Weight  10/29/23 1949 118/64 98.7 F (37.1 C) Oral 95 14 100 % 5' 5 (1.651 m) (!) 150 kg   Constitutional: Well-developed, well-nourished female in no acute distress.  Cardiovascular: normal rate Respiratory: normal rate and effort.  GI: Deferred MS:  normal ROM Neurologic: Alert and oriented x 4.  GU: Deferred  LAB RESULTS Results for orders placed or performed during the hospital encounter of 10/29/23 (from the past 24 hours)  Urinalysis, Routine w reflex microscopic -Urine, Clean Catch     Status: Abnormal   Collection Time: 10/29/23  7:51 PM  Result Value Ref  Range   Color, Urine YELLOW YELLOW   APPearance HAZY (A) CLEAR   Specific Gravity, Urine 1.027 1.005 - 1.030   pH 6.0 5.0 - 8.0   Glucose, UA NEGATIVE NEGATIVE mg/dL   Hgb urine dipstick NEGATIVE NEGATIVE   Bilirubin Urine NEGATIVE NEGATIVE   Ketones, ur NEGATIVE NEGATIVE mg/dL   Protein, ur NEGATIVE NEGATIVE mg/dL   Nitrite NEGATIVE NEGATIVE   Leukocytes,Ua NEGATIVE NEGATIVE  CBC with Differential/Platelet     Status: None   Collection Time: 10/29/23 11:57 PM  Result Value Ref Range   WBC 9.4 4.0 - 10.5 K/uL   RBC 4.32 3.87 - 5.11 MIL/uL   Hemoglobin 12.0 12.0 - 15.0 g/dL   HCT 62.8 63.9 - 53.9 %   MCV 85.9 80.0 - 100.0 fL   MCH 27.8 26.0 - 34.0 pg   MCHC 32.3 30.0 - 36.0 g/dL   RDW 86.7 88.4 - 84.4 %   Platelets 368 150 - 400 K/uL   nRBC 0.0 0.0 - 0.2 %   Neutrophils Relative % 66 %   Neutro Abs 6.3 1.7 - 7.7 K/uL   Lymphocytes Relative 24 %   Lymphs Abs 2.3 0.7 - 4.0 K/uL   Monocytes Relative 7 %   Monocytes Absolute 0.7 0.1 - 1.0 K/uL   Eosinophils Relative 2 %   Eosinophils Absolute 0.1 0.0 - 0.5 K/uL   Basophils Relative 0 %   Basophils Absolute 0.0 0.0 - 0.1 K/uL   Immature Granulocytes 1 %   Abs Immature Granulocytes 0.06 0.00 - 0.07 K/uL   *Note: Due to a large number of results and/or encounters for the requested time period, some results have not been displayed. A complete set of results can be found in Results Review.    IMAGING US  OB Transvaginal Result Date: 10/30/2023 CLINICAL DATA:  Pelvic pain and vaginal bleeding EXAM: TRANSVAGINAL OB ULTRASOUND TECHNIQUE: Transvaginal ultrasound was performed for complete evaluation of the gestation as well as the maternal uterus, adnexal regions, and pelvic cul-de-sac. COMPARISON:  10/16/2023 FINDINGS: Intrauterine gestational sac: Present Yolk sac:  Present Embryo:  Present Cardiac Activity: Present Heart Rate: 169 bpm CRL:   19.4 mm   8 w 3 d                  US  EDC: 06/07/2024 Subchorionic hemorrhage:  None  visualized. Maternal uterus/adnexae: Ovaries are within normal limits. IMPRESSION: Single live intrauterine gestation at 8 weeks 3 days with satisfactory progression when compared with the prior exam. Electronically Signed   By: Oneil Devonshire M.D.   On: 10/30/2023 00:43    MAU COURSE Orders Placed This Encounter  Procedures   US  OB Transvaginal   Urinalysis, Routine w reflex microscopic -Urine, Clean Catch   CBC with Differential/Platelet   Discharge patient   No orders of the defined types were placed in this encounter.   MDM - 8.1 week w/ abd pain and bleeding. US  shows live SIUP w/ small [facility] which is likely the cause of pain and bleeding. No evidence of miscarriage or emergent condition.   ASSESSMENT 1. Vaginal bleeding affecting early pregnancy  2. Abdominal pain affecting pregnancy   3. Normal intrauterine pregnancy on prenatal ultrasound in first trimester   4. Subchorionic hemorrhage of placenta in first trimester     PLAN Discharge home in stable condition. Bleeding precautions  Follow-up Information     [facility] Follow up.   Specialty: Obstetrics and Gynecology Why: Routine prenatal visit Contact information: [address], [address] [place]  [address] [phone]        [facility] Follow up.   Specialty: Obstetrics and Gynecology Why: As needed in emergencies Contact information: [address] [place]  [phone]               Allergies as of 10/30/2023       Reactions   Hydrocodone  Hives   Milk-related Compounds Dermatitis   Breaks face out *can have 2% milk*   Mushroom Extract Complex (obsolete) Anaphylaxis   Penicillins Anaphylaxis   Has patient had a PCN reaction causing immediate rash, facial/tongue/throat swelling, SOB or lightheadedness with hypotension: Yes Has patient had a PCN reaction causing severe rash  involving mucus membranes or skin necrosis: No Has patient had a PCN reaction that required hospitalization No Has patient had a PCN reaction occurring within the last 10 years: Yes If all of the above answers are NO, then may proceed with Cephalosporin use.   Cheese Other (See Comments)   Breaks face out   Latex Itching, Swelling   Sulfa Antibiotics Swelling        Medication List     TAKE these medications    busPIRone  5 MG tablet Commonly known as: BUSPAR  Take 1 tablet (5 mg total) by mouth 3 (three) times daily.   cetirizine  10 MG tablet Commonly known as: ZYRTEC  Take 1 tablet (10 mg total) by mouth daily.   Doxylamine -Pyridoxine  10-10 MG Tbec Commonly known as: Diclegis  Take 1 tablet by mouth See admin instructions. (Diclegis (R)) Initial, 2 tablets (doxylamine  succinate 10 mg/pyridoxine  hydrochloride 10 mg per tablet) orally at bedtime on day 1 and 2; if symptoms persist, take 1 tablet in morning and 2 tablets at bedtime on day 3; if symptoms persist, may increase to MAX 4 tablets per day, administered as 1 tablet in the morning, 1 tablet in mid-afternoon and 2 tablets at bedtime   EPINEPHrine  0.3 mg/0.3 mL Soaj injection Commonly known as: EpiPen  2-Pak Inject 0.3 mg into the muscle once as needed (for severe allergic reaction). CAll 911 immediately if you have to use this medicine   gabapentin  100 MG capsule Commonly known as: NEURONTIN  Take 2 capsules (200 mg total) by mouth at bedtime.   hydrOXYzine  25 MG capsule Commonly known as: VISTARIL  TAKE 1 CAPSULE (25 MG TOTAL) BY MOUTH AT BEDTIME AS NEEDED FOR ANXIETY.   lamoTRIgine  200 MG tablet Commonly known as: LAMICTAL  Take 1 tablet (200 mg total) by mouth 3 (three) times daily.   levETIRAcetam  750 MG tablet Commonly known as: KEPPRA  Take 2 tablets (1,500 mg total) by mouth 2 (two) times daily.   metoCLOPramide  10 MG tablet Commonly known as: Reglan  Take 1 tablet (10 mg total) by mouth 3 (three) times daily  before meals.   ondansetron  4 MG disintegrating tablet Commonly known as: ZOFRAN -ODT Take 1 tablet (4 mg total) by mouth every 8 (eight) hours as needed for refractory nausea / vomiting.   polyethylene glycol powder 17 GM/SCOOP powder Commonly known as: MiraLax  Take 17 g by mouth 2 (  two) times daily. Dissolve 1 capful (17g) in 4-8 ounces of liquid and take by mouth daily.   sertraline  100 MG tablet Commonly known as: ZOLOFT  Take 1 tablet (100 mg total) by mouth daily.   Symbicort  80-4.5 MCG/ACT inhaler Generic drug: budesonide -formoterol  Inhale 2 puffs into the lungs 2 (two) times daily as needed (shortness of breath).   Ventolin  HFA 108 (90 Base) MCG/ACT inhaler Generic drug: albuterol  Inhale 1-2 puffs into the lungs every 6 (six) hours as needed for wheezing or shortness of breath.         Speth, Terri Rorrer , CNM 10/30/2023  2:27 AM

## 2023-11-05 ENCOUNTER — Ambulatory Visit: Admitting: Obstetrics

## 2023-11-05 ENCOUNTER — Other Ambulatory Visit (HOSPITAL_COMMUNITY)
Admission: RE | Admit: 2023-11-05 | Discharge: 2023-11-05 | Disposition: A | Discharge status: Home / Self Care | Source: Ambulatory Visit | Attending: Obstetrics | Admitting: Obstetrics

## 2023-11-05 ENCOUNTER — Ambulatory Visit (INDEPENDENT_AMBULATORY_CARE_PROVIDER_SITE_OTHER): Admitting: *Deleted

## 2023-11-05 ENCOUNTER — Other Ambulatory Visit (INDEPENDENT_AMBULATORY_CARE_PROVIDER_SITE_OTHER): Payer: Self-pay

## 2023-11-05 ENCOUNTER — Other Ambulatory Visit: Payer: Self-pay

## 2023-11-05 ENCOUNTER — Encounter: Payer: Self-pay | Admitting: Obstetrics

## 2023-11-05 VITALS — BP 115/79 | HR 91 | Wt 336.6 lb

## 2023-11-05 DIAGNOSIS — Z23 Encounter for immunization: Secondary | ICD-10-CM | POA: Diagnosis not present

## 2023-11-05 DIAGNOSIS — O0990 Supervision of high risk pregnancy, unspecified, unspecified trimester: Secondary | ICD-10-CM | POA: Insufficient documentation

## 2023-11-05 DIAGNOSIS — Z362 Encounter for other antenatal screening follow-up: Secondary | ICD-10-CM

## 2023-11-05 DIAGNOSIS — K219 Gastro-esophageal reflux disease without esophagitis: Secondary | ICD-10-CM

## 2023-11-05 DIAGNOSIS — Z3481 Encounter for supervision of other normal pregnancy, first trimester: Secondary | ICD-10-CM

## 2023-11-05 DIAGNOSIS — R599 Enlarged lymph nodes, unspecified: Secondary | ICD-10-CM

## 2023-11-05 DIAGNOSIS — O0991 Supervision of high risk pregnancy, unspecified, first trimester: Secondary | ICD-10-CM

## 2023-11-05 DIAGNOSIS — O219 Vomiting of pregnancy, unspecified: Secondary | ICD-10-CM

## 2023-11-05 DIAGNOSIS — Z3A09 9 weeks gestation of pregnancy: Secondary | ICD-10-CM

## 2023-11-05 MED ORDER — BLOOD PRESSURE KIT DEVI: 1.0000 | 0 refills | Status: AC

## 2023-11-05 MED ORDER — PANTOPRAZOLE SODIUM 40 MG PO TBEC
40.0000 mg | DELAYED_RELEASE_TABLET | Freq: Two times a day (BID) | ORAL | 5 refills | Status: AC
  Filled 2023-11-05 – 2023-11-21 (×2): qty 60, 30d supply, fill #0
  Filled 2024-01-15 – 2024-02-06 (×2): qty 60, 30d supply, fill #1

## 2023-11-05 MED ORDER — PRENATE MINI 18-0.6-0.4-350 MG PO CAPS: 1.0000 | ORAL_CAPSULE | Freq: Every day | ORAL | 3 refills | Status: DC

## 2023-11-05 MED ORDER — PRENATE MINI 18-0.6-0.4-350 MG PO CAPS
1.0000 | ORAL_CAPSULE | Freq: Every day | ORAL | 3 refills | Status: DC
Start: 2023-11-05 — End: 2023-11-05

## 2023-11-05 MED ORDER — DOXYLAMINE-PYRIDOXINE 10-10 MG PO TBEC
1.0000 | DELAYED_RELEASE_TABLET | ORAL | 0 refills | Status: AC
Start: 2023-11-05 — End: ?

## 2023-11-05 MED ORDER — PRENATE MINI 18-0.6-0.4-350 MG PO CAPS
1.0000 | ORAL_CAPSULE | Freq: Every day | ORAL | 3 refills | Status: AC
  Filled 2023-11-05 – 2023-11-21 (×2): qty 90, 90d supply, fill #0
  Filled 2024-01-15 – 2024-02-06 (×2): qty 90, 90d supply, fill #1

## 2023-11-05 NOTE — Progress Notes (Signed)
 New OB Intake  I connected with Latoya Stark  on 11/05/23 at  9:15 AM EDT by In Person Visit and verified that I am speaking with the correct person using two identifiers. Nurse is located at [facility] and pt is located at [facility].  I discussed the limitations, risks, security and privacy concerns of performing an evaluation and management service by telephone and the availability of in person appointments. I also discussed with the patient that there may be a patient responsible charge related to this service. The patient expressed understanding and agreed to proceed.  I explained I am completing New OB Intake today. We discussed EDD of 06/09/24 based on LMP of 09/03/23. Pt is [id number]. I reviewed her allergies, medications and Medical/Surgical/OB history.    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 Discussed the nature of our practice with multiple providers including residents and students as well as female and female providers. Due to the size of the practice, the delivering provider may not be the same as those providing prenatal care.   Patient is not interested in water   birth.  MyChart/Babyscripts MyChart access verified. I explained pt will have some visits in office and some virtually. Babyscripts instructions given and order placed. Patient verifies receipt of registration text/e-mail. Account successfully created and app downloaded. If patient is a candidate for Optimized scheduling, add to sticky note.   Blood Pressure Cuff/Weight Scale Blood pressure cuff ordered for patient to pick-up from [employer]. Explained after first prenatal appt pt will check weekly and document in Babyscripts. Patient does not have weight scale; patient may purchase if they desire to track weight weekly in Babyscripts.  Anatomy US  Explained first scheduled US  will be around 19 weeks. Anatomy US  scheduled for TBD at TBD.  Is patient a candidate for Babyscripts Optimization? No, due to Risk Factors.   First visit review I reviewed new OB appt with patient. Explained pt will be seen by Dr. Rudy at first visit. Discussed [facility] genetic screening with patient. Requests Panorama. Routine prenatal labs OB Urine collected at today's visit. Initial prenatal labs deferred to next visit.   Last Pap Diagnosis  Date Value Ref Range Status  08/11/2021   Final   - Negative for intraepithelial lesion or malignancy (NILM)    Rocky CHRISTELLA Ober, RN 11/05/2023  12:11 PM

## 2023-11-05 NOTE — Progress Notes (Signed)
 Subjective:    Latoya Stark is being seen today for her first obstetrical visit.  This is not a planned pregnancy. She is at [redacted]w[redacted]d gestation. Her obstetrical history is significant for obesity. Relationship with FOB: significant other, living together. Patient does intend to breast feed. Pregnancy history fully reviewed.  The information documented in the HPI was reviewed and verified.  Menstrual History: OB History     Gravida  9   Para  2   Term  2   Preterm      AB  6   Living  2      SAB  4   IAB  1   Ectopic      Multiple  0   Live Births  2            Patient's last menstrual period was 09/03/2023 (approximate).    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   started at age 68   Headache    Obesity    Pneumonia    Postpartum depression, postpartum condition 08/11/2021   Seizures (HCC)    when I get too hot   Trichomonas infection    Umbilical hernia 04/2022   UTI (lower urinary tract infection)    Vulvar abscess 08/11/2021    Past Surgical History:  Procedure Laterality Date   COLONOSCOPY N/A 05/22/2023   Procedure: COLONOSCOPY;  Surgeon: Therisa Bi, MD;  Location: [facility] ENDOSCOPY;  Service: Gastroenterology;  Laterality: N/A;   DILATION AND EVACUATION N/A 08/09/2013   Procedure: DILATATION AND EVACUATION;  Surgeon: Aida DELENA Na, MD;  Location: [facility] ORS;  Service: Gynecology;  Laterality: N/A;   DILATION AND EVACUATION  2017    ESOPHAGOGASTRODUODENOSCOPY (EGD) WITH PROPOFOL  N/A 11/10/2022   Procedure: ESOPHAGOGASTRODUODENOSCOPY (EGD) WITH PROPOFOL ;  Surgeon: Therisa Bi, MD;  Location: [facility] ENDOSCOPY;  Service: Gastroenterology;  Laterality: N/A;   EUSTACHIAN TUBE DILATION     EXCISION MASS NECK N/A 11/16/2022   Procedure: EXCISION MASS NECK;  Surgeon: Jordis Laneta FALCON, MD;  Location: [facility] ORS;  Service: General;  Laterality: N/A;  JP drain placed   LACERATION REPAIR Left 2007   5th toe   ORTHOPEDIC SURGERY Left 2010   hand fracture; screws in place   TONSILLECTOMY AND ADENOIDECTOMY  2001   TYMPANOSTOMY TUBE PLACEMENT Bilateral 2002   UMBILICAL HERNIA REPAIR N/A 05/12/2022   Procedure: LAPAROSCOPIC ASSISTED SUPRA UMBILICAL HERNIA REPAIR;  Surgeon: Gladis Cough, MD;  Location: [facility] ORS;  Service: General;  Laterality: N/A;    (Not in a hospital admission)  Allergies  Allergen Reactions   Hydrocodone  Hives   Milk-Related Compounds Dermatitis    Breaks face out *can have 2% milk*   Mushroom Extract Complex (Obsolete) Anaphylaxis   Penicillins Anaphylaxis    Has patient had a PCN reaction causing immediate rash, facial/tongue/throat swelling, SOB or lightheadedness with hypotension: Yes Has patient had a PCN reaction causing  severe rash involving mucus membranes or skin necrosis: No Has patient had a PCN reaction that required hospitalization No Has patient had a PCN reaction occurring within the last 10 years: Yes If all of the above answers are NO, then may proceed with Cephalosporin use.    Cheese Other (See Comments)    Breaks face out   Latex Itching and Swelling   Sulfa Antibiotics Swelling    Social History   Tobacco Use   Smoking status: Never    Passive exposure: Never   Smokeless tobacco: Never  Substance Use Topics   Alcohol use: Not Currently    Comment: occassional    Family History  Problem Relation Age of Onset   Heart disease Mother    Depression Mother    Cancer Mother         Bone cancer   Seizures Mother    Stroke Mother    Sickle cell anemia Mother    Hyperlipidemia Father    Heart disease Father    Hypertension Father    Sleep apnea Father    Seizures Maternal Aunt    Diabetes Maternal Grandmother    Colon cancer Maternal Grandmother    Hypertension Maternal Grandmother    Clotting disorder Maternal Grandmother    Breast cancer Maternal Grandmother    Stroke Maternal Grandmother    Diabetes Paternal Grandmother      Review of Systems Constitutional: negative for weight loss Gastrointestinal: positive for nausea and vomiting Genitourinary:negative for genital lesions and vaginal discharge and dysuria Musculoskeletal:negative for back pain Behavioral/Psych: negative for abusive relationship, depression, illegal drug usage and tobacco use    Objective:    LMP 09/03/2023 (Approximate)  General Appearance:    Alert, cooperative, no distress, appears stated age  Head:    Normocephalic, without obvious abnormality, atraumatic  Eyes:    PERRL, conjunctiva/corneas clear, EOM's intact, fundi    benign, both eyes  Ears:    Normal TM's and external ear canals, both ears  Nose:   Nares normal, septum midline, mucosa normal, no drainage    or sinus tenderness  Throat:   Lips, mucosa, and tongue normal; teeth and gums normal  Neck:   Supple, symmetrical, trachea midline, no adenopathy;    thyroid :  no enlargement/tenderness/nodules; no carotid   bruit or JVD  Back:     Symmetric, no curvature, ROM normal, no CVA tenderness  Lungs:     Clear to auscultation bilaterally, respirations unlabored  Chest Wall:    No tenderness or deformity   Heart:    Regular rate and rhythm, S1 and S2 normal, no murmur, rub   or gallop  Breast Exam:    No tenderness, masses, or nipple abnormality  Abdomen:     Soft, non-tender, bowel sounds active all four quadrants,    no masses, no organomegaly  Genitalia:    Normal female without lesion, discharge or tenderness   Extremities:   Extremities normal, atraumatic, no cyanosis or edema  Pulses:   2+ and symmetric all extremities  Skin:   Skin color, texture, turgor normal, no rashes or lesions  Lymph nodes:   Cervical, supraclavicular, and axillary nodes normal  Neurologic:   CNII-XII intact, normal strength, sensation and reflexes    throughout      Lab Review Urine pregnancy test Labs reviewed yes Radiologic studies reviewed yes   NM PET Image Initial (PI) Skull Base To Thigh (Accession [id number]) (Order [id number]) Imaging Date: 09/25/2023 Department: [facility] MEDICINE  Released By: Lewis, Janna M Authorizing: Walisiewicz, Kaitlyn E, PA-C   Exam Status  Status  Final [99]   PACS Intelerad Image Link   Show images for NM PET Image Initial (PI) Skull Base To Thigh Study Result  Narrative & Impression  CLINICAL DATA:  Initial treatment strategy for mesenteric lymphadenopathy.   EXAM: NUCLEAR MEDICINE PET SKULL BASE TO THIGH   TECHNIQUE: 15.9 mCi F-18 FDG was injected intravenously. Full-ring PET imaging was performed from the skull base to thigh after the radiotracer. CT data was obtained and used for attenuation correction and anatomic localization.   Fasting blood glucose: 86 mg/dl   COMPARISON:  CT 90/90/7974, CT 11/01/2015   FINDINGS: NECK: Symmetric hypermetabolic lymphoid tissue within the posterior oropharynx   Incidental CT findings: None.   CHEST: No hypermetabolic mediastinal or hilar nodes. No suspicious pulmonary nodules on the CT scan.   Incidental CT findings: None.   ABDOMEN/PELVIS: Again demonstrated enlarged central mesenteric lymph nodes. The lymph nodes do have associated metabolic activity. For example LEFT central mesenteric node on image 126 measures 13 mm with SUV max equal 9.2. Similar RIGHT central mesenteric node measures 19 mm on image 135 with SUV max equal 9.2. Approximately twenty hypermetabolic central  mesenteric nodes. A similar hypermetabolic node in the porta hepatis with SUV max equal 7.6 on image 107.   Spleen is normal volume with metabolic activity within normal limits.   Small hypermetabolic retrocrural node on image 90   No clear iliac hypermetabolic nodes. No hypermetabolic inguinal nodes.   Hypermetabolic RIGHT ovarian tissue with SUV max equal 23 on image 162. No CT abnormality.   Incidental CT findings: None.   SKELETON: There is diffuse low level metabolic activity throughout the skeleton. For example SUV max 6.3 at the L 3 vertebral body. No lesion on CT portion exam. Similar metabolic activity through the iliac bones and sacrum.   Incidental CT findings: None.   IMPRESSION: 1. Hypermetabolic central mesenteric lymphadenopathy and porta hepatis lymphadenopathy. Nonspecific findings. Lymphoma remains in the differential. No clear good biopsy target as nodes within the central mesentery. No clearly enlarged hypermetabolic iliac or inguinal adenopathy. No clear thoracic adenopathy 2. Diffuse low level metabolic activity throughout the skeleton. Differential includes lymphoma versus marrow stimulation potential related to anemia. 3. Asymmetric hypermetabolic LEFT ovarian tissue is favored physiologic.     Electronically Signed   By: Jackquline Boxer M.D.   On: 09/25/2023 16:30        US  OB Transvaginal (Accession [id number]) (Order [id number]) Imaging Date: 10/30/2023 Department: [facility] Released By: Leopoldo Burnard CROME, RT Authorizing: Trudy Leeroy NOVAK, MD   Exam Status  Status  Final [99]   PACS Intelerad Image Link   Show images for US  OB Transvaginal Study Result  Narrative & Impression  CLINICAL DATA:  Pelvic pain and vaginal bleeding   EXAM: TRANSVAGINAL OB ULTRASOUND   TECHNIQUE: Transvaginal ultrasound was performed for complete evaluation of the gestation as well as the maternal uterus, adnexal regions,  and pelvic cul-de-sac.   COMPARISON:  10/16/2023   FINDINGS: Intrauterine gestational sac: Present   Yolk sac:  Present   Embryo:  Present   Cardiac Activity: Present   Heart Rate: 169 bpm   CRL:   19.4 mm   8 w 3 d                  US  EDC: 06/07/2024   Subchorionic hemorrhage:  None visualized.   Maternal uterus/adnexae:  Ovaries are within normal limits.   IMPRESSION: Single live intrauterine gestation at 8 weeks 3 days with satisfactory progression when compared with the prior exam.     Electronically Signed   By: Oneil Devonshire M.D.   On: 10/30/2023 00:43         Assessment:    Pregnancy at [redacted]w[redacted]d weeks    Plan:    1. Supervision of high risk pregnancy, antepartum (Primary) Rx: - Flu vaccine trivalent PF, 6mos and older(Flulaval,Afluria,Fluarix,Fluzone) - Prenat-FeCbn-FeAsp-Meth-FA-DHA (PRENATE MINI ) 18-0.6-0.4-350 MG CAPS; Take 1 capsule by mouth daily before breakfast.  Dispense: 90 capsule; Refill: 3 - CBC/D/Plt+RPR+Rh+ABO+RubIgG... 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 Nausea and vomiting during pregnancy  4. GERD without esophagitis Rx: - pantoprazole  (PROTONIX ) 40 MG tablet; Take 1 tablet (40 mg total) by mouth 2 (two) times daily before a meal.  Dispense: 60 tablet; Refill: 5   Prenatal vitamins.  Counseling provided regarding continued use of seat belts, cessation of alcohol consumption, smoking or use of illicit drugs; infection precautions i.e., influenza/TDAP immunizations, toxoplasmosis,CMV, parvovirus, listeria and varicella; workplace safety, exercise during pregnancy; routine dental care, safe medications, sexual activity, hot tubs, saunas, pools, travel, caffeine  use, fish and methlymercury, potential toxins, hair treatments, varicose veins Weight gain recommendations per IOM guidelines reviewed: underweight/BMI< 18.5--> gain 28 - 40 lbs; normal weight/BMI 18.5 - 24.9--> gain 25  - 35 lbs; overweight/BMI 25 - 29.9--> gain 15 - 25 lbs; obese/BMI >30->gain  11 - 20 lbs Problem list reviewed and updated. FIRST/CF mutation testing/NIPT/QUAD SCREEN/fragile X/[name] population testing/Spinal muscular atrophy discussed: requested. Role of ultrasound in pregnancy discussed; fetal survey: requested. Amniocentesis discussed: not indicated.  Meds ordered this encounter  Medications   DISCONTD: Prenat-FeCbn-FeAsp-Meth-FA-DHA (PRENATE MINI ) 18-0.6-0.4-350 MG CAPS    Sig: Take 1 capsule by mouth daily before breakfast.    Dispense:  90 capsule    Refill:  3   DISCONTD: Prenat-FeCbn-FeAsp-Meth-FA-DHA (PRENATE MINI ) 18-0.6-0.4-350 MG CAPS    Sig: Take 1 capsule by mouth daily before breakfast.    Dispense:  90 capsule    Refill:  3   Prenat-FeCbn-FeAsp-Meth-FA-DHA (PRENATE MINI ) 18-0.6-0.4-350 MG CAPS    Sig: Take 1 capsule by mouth daily before breakfast.    Dispense:  90 capsule    Refill:  3   pantoprazole  (PROTONIX ) 40 MG tablet    Sig: Take 1 tablet (40 mg total) by mouth 2 (two) times daily before a meal.    Dispense:  60 tablet    Refill:  5   Orders Placed This Encounter  Procedures   Flu vaccine trivalent PF, 6mos and older(Flulaval,Afluria,Fluarix,Fluzone)   CBC/D/Plt+RPR+Rh+ABO+RubIgG...    Release to patient:   Immediate   HgB A1c   PANORAMA PRENATAL TEST    ==========Department Information========== ID: [id number] Department:[facility]ALTHCARE AT [facility] [address], [address] [place] [address] Dept: [phone] Dept Fax: [phone]     Method/type of collection::   Clinic to manage sample collection    Expected due date (MM/DD/YYYY)::   06/09/2024    Is this a twin pregnancy? (viable, no vanished twin):   Not Twin Pregnancy, Latoya Stark    Is this a surrogate or egg donor pregnancy?:   No    I want fetal sex included in the report::   Yes  Maternal Weight (lbs)::    336    Which Microdeletion Panel should be ordered?:   22q11.2 Deletion    What type of billing?:   Automatic Data    By placing this electronic order I confirm the testing ordered herein is medically necessary and this patient has been informed of the details of the genetic test(s) ordered, including the risks, benefits, and alternatives, and has consented to testing.:   Yes    Select an order diagnosis: For additional options refer to [email or web address]:   Encounter for supervision of other normal pregnancy in first trimester [1509661]   HORIZON Basic Panel    ==========Department Information========== ID: [id number] Department:[facility] FOR [facility]ALTHCARE AT [facility] [address] [place] [address] Dept: [phone] Dept Fax: [phone]     Specify the name or ID of a valid Horizon Custom Panel::   HBASIC    Is patient pregnant?:   Yes    Practice ensures that HIPAA consent is obtained and will make available to [facility] upon request?:   Yes    By placing this electronic order I confirm the testing ordered herein is medically necessary and this patient has been informed of the details of the genetic test(s) ordered, including the risks, benefits, and alternatives, and has consented to testing.:   Yes    What type of billing?:   [employer] an order diagnosis: For additional options refer to [email or web address]:   Encounter for supervision of other normal pregnancy, first trimester [8370109]    Tay-Sachs add-on test?:   No    Follow up in 4 weeks.  I have spent a total of 30 minutes of face-to-face time, excluding clinical staff time, reviewing notes and preparing to see patient, ordering tests and/or medications, and counseling the patient.   CARLIN RONAL CENTERS, MD, FACOG Attending Obstetrician & Gynecologist, [facility] for  [facility], [facility], [place] 11/05/2023

## 2023-11-05 NOTE — Patient Instructions (Signed)
 Options for Doula Care in the [place]  As you review your birthing options, consider having a birth doula. A doula is trained to provide support before, during and just after you give birth. There are also postpartum doulas that help you adjust to new parenthood.  While doulas do not provide medical care, they do provide emotional, physical and educational support. A few months before your baby arrives, doulas can help answer questions, ease concerns and help you create and support your birthing plan.    Doulas can help reduce your stress and comfort you and your partner. They can help you cope with labor by helping you use breathing techniques, massage, creative labor positioning, essential oils and affirmations.   Studies show that the benefits of having a doula include:   A more positive birth experience  Fewer requests for pain-relief medication  Less likelihood of cesarean section, commonly called a c-section   Doulas are typically hired via a [employer] account planner between you and the doula. We are happy to provide a list of the most active doulas in the area, all of whom are credentialed by [facility] and will not count as a visitor at your birth.  There are several options for no-cost doula care at our hospital, including:  [facility] Volunteer Doula Program Every [employer] Program A Cure 4 Moms Doula Study (available only at [employer] for Women, [place], [place] and [employer] [facility] offices)  For more information on these programs or to receive a list of doulas active in our area, please email [email or web address]

## 2023-11-06 ENCOUNTER — Other Ambulatory Visit: Payer: Self-pay

## 2023-11-06 ENCOUNTER — Other Ambulatory Visit: Payer: Self-pay | Admitting: Obstetrics

## 2023-11-06 ENCOUNTER — Ambulatory Visit: Payer: Self-pay | Admitting: Obstetrics

## 2023-11-06 DIAGNOSIS — B3731 Acute candidiasis of vulva and vagina: Secondary | ICD-10-CM

## 2023-11-06 LAB — CERVICOVAGINAL ANCILLARY ONLY
Bacterial Vaginitis (gardnerella): NEGATIVE
Candida Glabrata: NEGATIVE
Candida Vaginitis: POSITIVE — AB
Chlamydia: NEGATIVE
Comment: NEGATIVE
Comment: NEGATIVE
Comment: NEGATIVE
Comment: NEGATIVE
Comment: NEGATIVE
Comment: NORMAL
Neisseria Gonorrhea: NEGATIVE
Trichomonas: NEGATIVE

## 2023-11-06 MED ORDER — TERCONAZOLE 0.8 % VA CREA
1.0000 | TOPICAL_CREAM | Freq: Every day | VAGINAL | 0 refills | Status: DC
  Filled 2023-11-06: qty 20, 30d supply, fill #0
  Filled 2023-11-21: qty 20, 3d supply, fill #0

## 2023-11-07 ENCOUNTER — Encounter: Payer: Self-pay | Admitting: Obstetrics

## 2023-11-07 ENCOUNTER — Other Ambulatory Visit: Payer: Self-pay

## 2023-11-07 ENCOUNTER — Encounter: Payer: Self-pay | Admitting: Obstetrics and Gynecology

## 2023-11-07 LAB — URINE CULTURE, OB REFLEX

## 2023-11-07 LAB — CULTURE, OB URINE

## 2023-11-08 ENCOUNTER — Other Ambulatory Visit: Payer: Self-pay

## 2023-11-14 ENCOUNTER — Other Ambulatory Visit: Payer: Self-pay | Admitting: *Deleted

## 2023-11-15 ENCOUNTER — Telehealth: Payer: Self-pay | Admitting: Family Medicine

## 2023-11-15 NOTE — Telephone Encounter (Signed)
 LVM and sent mychart msg informing pt of need to reschedule 11/21/23 EEG appt - Burnard out

## 2023-11-16 ENCOUNTER — Other Ambulatory Visit: Payer: Self-pay

## 2023-11-21 ENCOUNTER — Other Ambulatory Visit: Payer: Self-pay

## 2023-11-21 ENCOUNTER — Other Ambulatory Visit: Admitting: *Deleted

## 2023-11-23 ENCOUNTER — Ambulatory Visit: Admitting: Nurse Practitioner

## 2023-11-28 ENCOUNTER — Other Ambulatory Visit: Admitting: *Deleted

## 2023-11-28 ENCOUNTER — Ambulatory Visit: Admitting: Neurology

## 2023-11-28 DIAGNOSIS — R569 Unspecified convulsions: Secondary | ICD-10-CM

## 2023-11-28 DIAGNOSIS — G40409 Other generalized epilepsy and epileptic syndromes, not intractable, without status epilepticus: Secondary | ICD-10-CM

## 2023-11-28 NOTE — Progress Notes (Signed)
   [facility]  EEG (ELECTROENCEPHALOGRAM) REPORT   STUDY DATE: 11/28/2023 PATIENT NAME: Latoya Stark DOB: 1995-11-13 MRN: [id number]  ORDERING CLINICIAN: Greig Forbes, NP  TECHNOLOGIST: Burnard Plummer, REEGT TECHNIQUE: Electroencephalogram was recorded utilizing standard 10-20 system of lead placement and reformatted into average and bipolar montages.  RECORDING TIME: 26 minutes 38 seconds  CLINICAL INFORMATION: 28 year old woman with seizures  FINDINGS: A digital EEG was performed while the patient was awake and drowsy. While awake and most alert there was a 10-11 hz posterior dominant rhythm. Voltages and frequencies were symmetric.  There were no focal, lateralizing, epileptiform activity or seizures seen.  Photic stimulation had a normal driving response. Hyperventilation was not performed as the patient was pregnant. EKG channel shows normal sinus rhythm.  The patient became drowsy and sleep was recorded during the second half of the recording.  IMPRESSION: This is a normal EEG while the patient was awake and asleep.   INTERPRETING PHYSICIAN:   Hajira Verhagen A. Vear, MD, PhD, [facility] Certified in Neurology, Clinical Neurophysiology, Sleep Medicine, Pain Medicine and Neuroimaging  [facility] [address] [place], [place] [address] [phone]

## 2023-11-29 ENCOUNTER — Inpatient Hospital Stay (HOSPITAL_COMMUNITY)
Admission: AD | Admit: 2023-11-29 | Discharge: 2023-11-29 | Disposition: A | Discharge status: Home / Self Care | Attending: Obstetrics & Gynecology | Admitting: Obstetrics & Gynecology

## 2023-11-29 ENCOUNTER — Other Ambulatory Visit: Payer: Self-pay

## 2023-11-29 DIAGNOSIS — G8929 Other chronic pain: Secondary | ICD-10-CM | POA: Insufficient documentation

## 2023-11-29 DIAGNOSIS — O99351 Diseases of the nervous system complicating pregnancy, first trimester: Secondary | ICD-10-CM | POA: Insufficient documentation

## 2023-11-29 DIAGNOSIS — Z3A12 12 weeks gestation of pregnancy: Secondary | ICD-10-CM | POA: Insufficient documentation

## 2023-11-29 DIAGNOSIS — M545 Low back pain, unspecified: Secondary | ICD-10-CM | POA: Diagnosis not present

## 2023-11-29 DIAGNOSIS — G43E19 Chronic migraine with aura, intractable, without status migrainosus: Secondary | ICD-10-CM | POA: Diagnosis present

## 2023-11-29 LAB — URINALYSIS, ROUTINE W REFLEX MICROSCOPIC
Bilirubin Urine: NEGATIVE
Glucose, UA: NEGATIVE mg/dL
Hgb urine dipstick: NEGATIVE
Ketones, ur: NEGATIVE mg/dL
Leukocytes,Ua: NEGATIVE
Nitrite: NEGATIVE
Protein, ur: NEGATIVE mg/dL
Specific Gravity, Urine: 1.027 (ref 1.005–1.030)
pH: 5 (ref 5.0–8.0)

## 2023-11-29 MED ORDER — DIPHENHYDRAMINE HCL 50 MG/ML IJ SOLN
25.0000 mg | INTRAMUSCULAR | Status: AC
  Administered 2023-11-29: 25 mg via INTRAVENOUS
  Filled 2023-11-29: qty 1

## 2023-11-29 MED ORDER — CYCLOBENZAPRINE HCL 5 MG PO TABS
10.0000 mg | ORAL_TABLET | Freq: Once | ORAL | Status: AC
  Administered 2023-11-29: 10 mg via ORAL
  Filled 2023-11-29: qty 2

## 2023-11-29 MED ORDER — CYCLOBENZAPRINE HCL 10 MG PO TABS: 10.0000 mg | ORAL_TABLET | Freq: Two times a day (BID) | ORAL | 0 refills | Status: AC | PRN

## 2023-11-29 MED ORDER — CAFFEINE 200 MG PO TABS
200.0000 mg | ORAL_TABLET | Freq: Once | ORAL | Status: AC
  Administered 2023-11-29: 200 mg via ORAL
  Filled 2023-11-29: qty 1

## 2023-11-29 MED ORDER — LACTATED RINGERS IV BOLUS
1000.0000 mL | Freq: Once | INTRAVENOUS | Status: AC
  Administered 2023-11-29: 1000 mL via INTRAVENOUS

## 2023-11-29 MED ORDER — PROCHLORPERAZINE EDISYLATE 10 MG/2ML IJ SOLN
10.0000 mg | Freq: Once | INTRAMUSCULAR | Status: AC
  Administered 2023-11-29: 10 mg via INTRAVENOUS
  Filled 2023-11-29: qty 2

## 2023-11-29 MED ORDER — ACETAMINOPHEN 500 MG PO TABS
1000.0000 mg | ORAL_TABLET | Freq: Once | ORAL | Status: AC
  Administered 2023-11-29: 1000 mg via ORAL
  Filled 2023-11-29: qty 2

## 2023-11-29 NOTE — MAU Note (Addendum)
 Latoya Stark is a 28 y.o. at [redacted]w[redacted]d here in MAU reporting: yesterday started having a migraine, blurry vision, and feeling light headed - worsened throughout the day today. Took tylenol  and flexeril . Also having on going lower back pain for 3 weeks. Reports her FOB and sister told her that she fell from her feeling dizzy last night after getting out of the shower - FOB states she fell backwards on her bottom. Denies abdominal pain, VB, or abnormal discharge.   Onset of complaint: yesterday  Pain score: 10 - HA; 6 - back Vitals:   11/29/23 1928  BP: 137/88  Pulse: 99  Resp: 18  Temp: 99.5 F (37.5 C)  SpO2: 98%     FHT: NA  Lab orders placed from triage: UA

## 2023-11-29 NOTE — [facility] Provider Note (Cosign Needed Addendum)
 History     [id number]  Arrival date and time: 11/29/23 1905    Chief Complaint  Patient presents with   Headache   Dizziness     HPI Latoya Stark is a 28 y.o. at [redacted]w[redacted]d by LMP c/w [redacted]w[redacted]d US , who presents for back pain and headache. She states that back pain has been going on for last three weeks. Nothing she has tried has made it better. She has a history of bulging disc in L5 as well as pinched nerve in her lumbar area. Has tried physical therapy in the past with no pain relief. Neurosurgery and orthopedics have recommended surgical repair but she has declined. Was first diagnosed 5 years ago.  Headache started over the last few days. She has a history of migraines and seizure disorder. Has never been on migraine medication before other than benefiting from side effect of her anti-seizure medications.. She states that this feels different from her previous migraines. Usually she can lay down in a dark room and fall asleep and the headache goes away. It has not been going away and nothing has helped. She endorses occasional blurred vision. She denies VB, abdominal pain.    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   started at age 18   Headache    Obesity    Pneumonia    Postpartum depression, postpartum condition 08/11/2021   Seizures (HCC)    when I get too hot   Trichomonas infection    Umbilical hernia 04/2022   UTI (lower urinary  tract infection)    Vulvar abscess 08/11/2021    Past Surgical History:  Procedure Laterality Date   COLONOSCOPY N/A 05/22/2023   Procedure: COLONOSCOPY;  Surgeon: Therisa Bi, MD;  Location: [facility] ENDOSCOPY;  Service: Gastroenterology;  Laterality: N/A;   DILATION AND EVACUATION N/A 08/09/2013   Procedure: DILATATION AND EVACUATION;  Surgeon: Aida DELENA Na, MD;  Location: [facility] ORS;  Service: Gynecology;  Laterality: N/A;   DILATION AND EVACUATION  2017   ESOPHAGOGASTRODUODENOSCOPY (EGD) WITH PROPOFOL  N/A 11/10/2022   Procedure: ESOPHAGOGASTRODUODENOSCOPY (EGD) WITH PROPOFOL ;  Surgeon: Therisa Bi, MD;  Location: [facility] ENDOSCOPY;  Service: Gastroenterology;  Laterality: N/A;   EUSTACHIAN TUBE DILATION     EXCISION MASS NECK N/A 11/16/2022   Procedure: EXCISION MASS NECK;  Surgeon: Jordis Laneta FALCON, MD;  Location: [facility] ORS;  Service: General;  Laterality: N/A;  JP drain placed   LACERATION REPAIR Left 2007   5th toe   ORTHOPEDIC SURGERY Left 2010   hand fracture; screws in place   TONSILLECTOMY AND ADENOIDECTOMY  2001   TYMPANOSTOMY TUBE PLACEMENT Bilateral 2002   UMBILICAL HERNIA REPAIR N/A 05/12/2022   Procedure: LAPAROSCOPIC ASSISTED SUPRA UMBILICAL HERNIA REPAIR;  Surgeon: Gladis Cough, MD;  Location: [facility] ORS;  Service: General;  Laterality: N/A;    Family History  Problem Relation Age of Onset  Heart disease Mother    Depression Mother    Cancer Mother        Bone cancer   Seizures Mother    Stroke Mother    Sickle cell anemia Mother    Hyperlipidemia Father    Heart disease Father    Hypertension Father    Sleep apnea Father    Seizures Maternal Aunt    Diabetes Maternal Grandmother    Colon cancer Maternal Grandmother    Hypertension Maternal Grandmother    Clotting disorder Maternal Grandmother    Breast cancer Maternal Grandmother    Stroke Maternal Grandmother    Diabetes Paternal Grandmother     Social History   Socioeconomic History   Marital status:  Single    Spouse name: Surveyor, Mining   Number of children: 2   Years of education: Not on file   Highest education level: Associate degree: academic program  Occupational History   Not on file  Tobacco Use   Smoking status: Never    Passive exposure: Never   Smokeless tobacco: Never  Vaping Use   Vaping status: Never Used  Substance and Sexual Activity   Alcohol use: Not Currently    Comment: occassional   Drug use: No   Sexual activity: Not Currently    Partners: Male  Other Topics Concern   Not on file  Social History Narrative   Caffeine : tea sometimes   Lives with fiance   Right handed    Social Drivers of Health   Financial Resource Strain: Low Risk  (11/24/2022)   Overall Financial Resource Strain (CARDIA)    Difficulty of Paying Living Expenses: Not hard at all  Food Insecurity: No Food Insecurity (11/24/2022)   Hunger Vital Sign    Worried About Running Out of Food in the Last Year: Never true    Ran Out of Food in the Last Year: Never true  Transportation Needs: No Transportation Needs (11/24/2022)   PRAPARE - Administrator, Civil Service (Medical): No    Lack of Transportation (Non-Medical): No  Physical Activity: Insufficiently Active (11/24/2022)   Exercise Vital Sign    Days of Exercise per Week: 5 days    Minutes of Exercise per Session: 20 min  Stress: No Stress Concern Present (11/24/2022)   [employer] of Occupational Health - Occupational Stress Questionnaire    Feeling of Stress : Not at all  Social Connections: Unknown (11/24/2022)   Social Connection and Isolation Panel    Frequency of Communication with Friends and Family: Patient declined    Frequency of Social Gatherings with Friends and Family: Patient declined    Attends Religious Services: Patient declined    Database Administrator or Organizations: No    Attends Engineer, Structural: Not on file    Marital Status: Never married  Intimate Partner Violence: Not At  Risk (08/14/2018)   Humiliation, Afraid, Rape, and Kick questionnaire    Fear of Current or Ex-Partner: No    Emotionally Abused: No    Physically Abused: No    Sexually Abused: No    Allergies  Allergen Reactions   Hydrocodone  Hives   Milk-Related Compounds Dermatitis    Breaks face out *can have 2% milk*   Mushroom Extract Complex (Obsolete) Anaphylaxis   Penicillins Anaphylaxis    Has patient had a PCN reaction causing immediate rash, facial/tongue/throat swelling, SOB or lightheadedness with hypotension: Yes Has patient had a PCN reaction causing severe rash involving mucus membranes or  skin necrosis: No Has patient had a PCN reaction that required hospitalization No Has patient had a PCN reaction occurring within the last 10 years: Yes If all of the above answers are NO, then may proceed with Cephalosporin use.    Cheese Other (See Comments)    Breaks face out   Latex Itching and Swelling   Sulfa Antibiotics Swelling    No current facility-administered medications on file prior to encounter.   Current Outpatient Medications on File Prior to Encounter  Medication Sig Dispense Refill   busPIRone  (BUSPAR ) 5 MG tablet Take 1 tablet (5 mg total) by mouth 3 (three) times daily. 90 tablet 0   cetirizine  (ZYRTEC ) 10 MG tablet Take 1 tablet (10 mg total) by mouth daily. 30 tablet 0   gabapentin  (NEURONTIN ) 100 MG capsule Take 2 capsules (200 mg total) by mouth at bedtime. 180 capsule 0   hydrOXYzine  (VISTARIL ) 25 MG capsule TAKE 1 CAPSULE (25 MG TOTAL) BY MOUTH AT BEDTIME AS NEEDED FOR ANXIETY. 90 capsule 0   lamoTRIgine  (LAMICTAL ) 200 MG tablet Take 1 tablet (200 mg total) by mouth 3 (three) times daily. 90 tablet 0   levETIRAcetam  (KEPPRA ) 750 MG tablet Take 2 tablets (1,500 mg total) by mouth 2 (two) times daily. 120 tablet 0   ondansetron  (ZOFRAN -ODT) 4 MG disintegrating tablet Take 1 tablet (4 mg total) by mouth every 8 (eight) hours as needed for refractory nausea / vomiting.  20 tablet 0   pantoprazole  (PROTONIX ) 40 MG tablet Take 1 tablet (40 mg total) by mouth 2 (two) times daily before a meal. 60 tablet 5   Prenat-FeCbn-FeAsp-Meth-FA-DHA (PRENATE MINI ) 18-0.6-0.4-350 MG CAPS Take 1 capsule by mouth daily before breakfast. 90 capsule 3   sertraline  (ZOLOFT ) 100 MG tablet Take 1 tablet (100 mg total) by mouth daily. 90 tablet 1   SYMBICORT  80-4.5 MCG/ACT inhaler Inhale 2 puffs into the lungs 2 (two) times daily as needed (shortness of breath). 10.2 g 10   albuterol  (VENTOLIN  HFA) 108 (90 Base) MCG/ACT inhaler Inhale 1-2 puffs into the lungs every 6 (six) hours as needed for wheezing or shortness of breath. 18 g 11   Blood Pressure Monitoring (BLOOD PRESSURE KIT) DEVI 1 Device by Does not apply route once a week. 1 each 0   Doxylamine -Pyridoxine  (DICLEGIS ) 10-10 MG TBEC Take 1 tablet by mouth See admin instructions. (Diclegis (R)) Initial, 2 tablets (doxylamine  succinate 10 mg/pyridoxine  hydrochloride 10 mg per tablet) orally at bedtime on day 1 and 2; if symptoms persist, take 1 tablet in morning and 2 tablets at bedtime on day 3; if symptoms persist, may increase to MAX 4 tablets per day, administered as 1 tablet in the morning, 1 tablet in mid-afternoon and 2 tablets at bedtime 60 tablet 0   EPINEPHrine  (EPIPEN  2-PAK) 0.3 mg/0.3 mL IJ SOAJ injection Inject 0.3 mg into the muscle once as needed (for severe allergic reaction). CAll 911 immediately if you have to use this medicine 2 each 1   metoCLOPramide  (REGLAN ) 10 MG tablet Take 1 tablet (10 mg total) by mouth 3 (three) times daily before meals. 90 tablet 0   polyethylene glycol powder (MIRALAX ) 17 GM/SCOOP powder Take 17 g by mouth 2 (two) times daily. Dissolve 1 capful (17g) in 4-8 ounces of liquid and take by mouth daily. 238 g 0   terconazole  (TERAZOL 3 ) 0.8 % vaginal cream Place 1 applicator vaginally at bedtime. 20 g 0    Pertinent positives and negative per HPI, all others reviewed and  negative  Physical Exam    BP 137/88 (BP Location: Right Arm)   Pulse 99   Temp 99.5 F (37.5 C) (Oral)   Resp 18   Ht 5' 5 (1.651 m)   Wt (!) 149.2 kg   LMP 09/03/2023 (Approximate)   SpO2 98%   BMI 54.73 kg/m   Patient Vitals for the past 24 hrs:  BP Temp Temp src Pulse Resp SpO2 Height Weight  11/29/23 1928 137/88 99.5 F (37.5 C) Oral 99 18 98 % 5' 5 (1.651 m) (!) 149.2 kg    Physical Exam Vitals and nursing note reviewed.  Constitutional:      Appearance: She is well-developed.  HENT:     Head: Normocephalic and atraumatic.     Mouth/Throat:     Mouth: Mucous membranes are moist.  Eyes:     Extraocular Movements: Extraocular movements intact.  Cardiovascular:     Rate and Rhythm: Normal rate and regular rhythm.  Pulmonary:     Effort: Pulmonary effort is normal.  Abdominal:     Palpations: Abdomen is soft.     Tenderness: There is no abdominal tenderness.  Skin:    Capillary Refill: Capillary refill takes less than 2 seconds.  Neurological:     General: No focal deficit present.     Mental Status: She is alert.     Labs Results for orders placed or performed during the hospital encounter of 11/29/23 (from the past 24 hours)  Urinalysis, Routine w reflex microscopic -Urine, Clean Catch     Status: None   Collection Time: 11/29/23  7:32 PM  Result Value Ref Range   Color, Urine YELLOW YELLOW   APPearance CLEAR CLEAR   Specific Gravity, Urine 1.027 1.005 - 1.030   pH 5.0 5.0 - 8.0   Glucose, UA NEGATIVE NEGATIVE mg/dL   Hgb urine dipstick NEGATIVE NEGATIVE   Bilirubin Urine NEGATIVE NEGATIVE   Ketones, ur NEGATIVE NEGATIVE mg/dL   Protein, ur NEGATIVE NEGATIVE mg/dL   Nitrite NEGATIVE NEGATIVE   Leukocytes,Ua NEGATIVE NEGATIVE   *Note: Due to a large number of results and/or encounters for the requested time period, some results have not been displayed. A complete set of results can be found in Results Review.   MAU Course  Procedures Lab Orders         Urinalysis,  Routine w reflex microscopic -Urine, Clean Catch    Meds ordered this encounter  Medications   acetaminophen  (TYLENOL ) tablet 1,000 mg   cyclobenzaprine  (FLEXERIL ) tablet 10 mg   caffeine  tablet 200 mg   lactated ringers  bolus 1,000 mL   diphenhydrAMINE  (BENADRYL ) injection 25 mg   prochlorperazine  (COMPAZINE ) injection 10 mg   cyclobenzaprine  (FLEXERIL ) 10 MG tablet    Sig: Take 1 tablet (10 mg total) by mouth 2 (two) times daily as needed.    Dispense:  30 tablet    Refill:  0    Supervising Provider:   PRATT, TANYA S [[id number]]   Imaging Orders  No imaging studies ordered today    MDM Moderate (Level 3-4)  Ordered tylenol  and flexeril . Care handed over to [facility]N at 2000.   Colter L Cashion, MD/ 11/29/23 9:06 PM  Reassessment @ 2100: Patient reports back pain has improved, but H/A is still 10/10. Requesting more medication for H/A. Reassessment @ 2225: H/A lessened after additional medications and IVFs. ~100 mL of IVFs left to infuse.  LR Bolus 1 liter Benadryl  25 mg IVP Compazine  10 mg IVP Caffeine   200 mg po   Assessment and Plan  1. Intractable chronic migraine with aura and without status migrainosus (Primary) - Prescription for: Flexeril  10 mg po BID - Information provided on migraine H/A  2. Chronic bilateral low back pain without sciatica - Information provided on chronic back pain   3. [redacted] weeks gestation of pregnancy   - Discharge home - Keep scheduled appt with [facility] on 12/05/2023 - Patient verbalized an understanding of the plan of care and agrees.   Toriana Sponsel, CNM  11/29/2023 9:14 PM

## 2023-11-29 NOTE — MAU Provider Note (Incomplete Revision)
 History     [id number]  Arrival date and time: 11/29/23 1905    Chief Complaint  Patient presents with  . Headache  . Dizziness     HPI Latoya Stark is a 28 y.o. at [redacted]w[redacted]d by LMP c/w [redacted]w[redacted]d US , who presents for back pain and headache. She states that back pain has been going on for last three weeks. Nothing she has tried has made it better. She has a history of bulging disc in L5 as well as pinched nerve in her lumbar area. Has tried physical therapy in the past with no pain relief. Neurosurgery and orthopedics have recommended surgical repair but she has declined. Was first diagnosed 5 years ago.  Headache started over the last few days. She has a history of migraines and seizure disorder. Has never been on migraine medication before other than benefiting from side effect of her anti-seizure medications.. She states that this feels different from her previous migraines. Usually she can lay down in a dark room and fall asleep and the headache goes away. It has not been going away and nothing has helped. She endorses occasional blurred vision. She denies VB, abdominal pain.    --/--/AB POS (09/23 1132)  Past Medical History:  Diagnosis Date  . ADHD (attention deficit hyperactivity disorder)   . Anemia   . Anxiety   . Asthma   . Back pain   . Chronic migraine 08/01/2018  . Constipation   . Depression   . Epilepsy (HCC)   . Family history of adverse reaction to anesthesia    maternal grandmother stopped breathing during surgery 2012  . GERD (gastroesophageal reflux disease)   . Gestational diabetes   . Gestational diabetes mellitus (GDM) in third trimester 04/19/2021   Normal PP GTT  . Gonorrhea in female 10/05/2013  . 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 Headache   . Obesity   . Pneumonia   . Postpartum depression, postpartum condition 08/11/2021  . Seizures (HCC)    when I get too hot  . Trichomonas infection   . Umbilical hernia 04/2022   . UTI (lower urinary tract infection)   . Vulvar abscess 08/11/2021    Past Surgical History:  Procedure Laterality Date  . COLONOSCOPY N/A 05/22/2023   Procedure: COLONOSCOPY;  Surgeon: Therisa Bi, MD;  Location: [facility] ENDOSCOPY;  Service: Gastroenterology;  Laterality: N/A;  . DILATION AND EVACUATION N/A 08/09/2013   Procedure: DILATATION AND EVACUATION;  Surgeon: Aida DELENA Na, MD;  Location: [facility] ORS;  Service: Gynecology;  Laterality: N/A;  . DILATION AND EVACUATION  2017  . ESOPHAGOGASTRODUODENOSCOPY (EGD) WITH PROPOFOL  N/A 11/10/2022   Procedure: ESOPHAGOGASTRODUODENOSCOPY (EGD) WITH PROPOFOL ;  Surgeon: Therisa Bi, MD;  Location: [facility] ENDOSCOPY;  Service: Gastroenterology;  Laterality: N/A;  . EUSTACHIAN TUBE DILATION    . EXCISION MASS NECK N/A 11/16/2022   Procedure: EXCISION MASS NECK;  Surgeon: Jordis Laneta FALCON, MD;  Location: [facility] ORS;  Service: General;  Laterality: N/A;  JP drain placed  . LACERATION REPAIR Left 2007   5th toe  . ORTHOPEDIC SURGERY Left 2010   hand fracture; screws in place  . TONSILLECTOMY AND ADENOIDECTOMY  2001  . TYMPANOSTOMY TUBE PLACEMENT Bilateral 2002  . UMBILICAL HERNIA REPAIR N/A 05/12/2022   Procedure: LAPAROSCOPIC ASSISTED SUPRA UMBILICAL HERNIA REPAIR;  Surgeon: Gladis Cough, MD;  Location: [facility] ORS;  Service: General;  Laterality: N/A;    Family History  Problem Relation Age of Onset  .  Heart disease Mother   . Depression Mother   . Cancer Mother        Bone cancer  . Seizures Mother   . Stroke Mother   . Sickle cell anemia Mother   . Hyperlipidemia Father   . Heart disease Father   . Hypertension Father   . Sleep apnea Father   . Seizures Maternal Aunt   . Diabetes Maternal Grandmother   . Colon cancer Maternal Grandmother   . Hypertension Maternal Grandmother   . Clotting disorder Maternal Grandmother   . Breast cancer Maternal Grandmother   . Stroke Maternal Grandmother   . Diabetes Paternal Grandmother     Social  History   Socioeconomic History  . Marital status: Single    Spouse name: Jermaine  . Number of children: 2  . Years of education: Not on file  . Highest education level: Associate degree: academic program  Occupational History  . Not on file  Tobacco Use  . Smoking status: Never    Passive exposure: Never  . Smokeless tobacco: Never  Vaping Use  . Vaping status: Never Used  Substance and Sexual Activity  . Alcohol use: Not Currently    Comment: occassional  . Drug use: No  . Sexual activity: Not Currently    Partners: Male  Other Topics Concern  . Not on file  Social History Narrative   Caffeine : tea sometimes   Lives with fiance   Right handed    Social Drivers of Health   Financial Resource Strain: Low Risk  (11/24/2022)   Overall Financial Resource Strain (CARDIA)   . Difficulty of Paying Living Expenses: Not hard at all  Food Insecurity: No Food Insecurity (11/24/2022)   Hunger Vital Sign   . Worried About Programme Researcher, Broadcasting/film/video in the Last Year: Never true   . Ran Out of Food in the Last Year: Never true  Transportation Needs: No Transportation Needs (11/24/2022)   PRAPARE - Transportation   . Lack of Transportation (Medical): No   . Lack of Transportation (Non-Medical): No  Physical Activity: Insufficiently Active (11/24/2022)   Exercise Vital Sign   . Days of Exercise per Week: 5 days   . Minutes of Exercise per Session: 20 min  Stress: No Stress Concern Present (11/24/2022)   [employer] of Occupational Health - Occupational Stress Questionnaire   . Feeling of Stress : Not at all  Social Connections: Unknown (11/24/2022)   Social Connection and Isolation Panel   . Frequency of Communication with Friends and Family: Patient declined   . Frequency of Social Gatherings with Friends and Family: Patient declined   . Attends Religious Services: Patient declined   . Active Member of Clubs or Organizations: No   . Attends Banker Meetings: Not  on file   . Marital Status: Never married  Intimate Partner Violence: Not At Risk (08/14/2018)   Humiliation, Afraid, Rape, and Kick questionnaire   . Fear of Current or Ex-Partner: No   . Emotionally Abused: No   . Physically Abused: No   . Sexually Abused: No    Allergies  Allergen Reactions  . Hydrocodone  Hives  . Milk-Related Compounds Dermatitis    Breaks face out *can have 2% milk*  . Mushroom Extract Complex (Obsolete) Anaphylaxis  . Penicillins Anaphylaxis    Has patient had a PCN reaction causing immediate rash, facial/tongue/throat swelling, SOB or lightheadedness with hypotension: Yes Has patient had a PCN reaction causing severe rash involving mucus membranes or  skin necrosis: No Has patient had a PCN reaction that required hospitalization No Has patient had a PCN reaction occurring within the last 10 years: Yes If all of the above answers are NO, then may proceed with Cephalosporin use.   SABRA Mariscal Other (See Comments)    Breaks face out  . Latex Itching and Swelling  . Sulfa Antibiotics Swelling    No current facility-administered medications on file prior to encounter.   Current Outpatient Medications on File Prior to Encounter  Medication Sig Dispense Refill  . busPIRone  (BUSPAR ) 5 MG tablet Take 1 tablet (5 mg total) by mouth 3 (three) times daily. 90 tablet 0  . cetirizine  (ZYRTEC ) 10 MG tablet Take 1 tablet (10 mg total) by mouth daily. 30 tablet 0  . gabapentin  (NEURONTIN ) 100 MG capsule Take 2 capsules (200 mg total) by mouth at bedtime. 180 capsule 0  . hydrOXYzine  (VISTARIL ) 25 MG capsule TAKE 1 CAPSULE (25 MG TOTAL) BY MOUTH AT BEDTIME AS NEEDED FOR ANXIETY. 90 capsule 0  . lamoTRIgine  (LAMICTAL ) 200 MG tablet Take 1 tablet (200 mg total) by mouth 3 (three) times daily. 90 tablet 0  . levETIRAcetam  (KEPPRA ) 750 MG tablet Take 2 tablets (1,500 mg total) by mouth 2 (two) times daily. 120 tablet 0  . ondansetron  (ZOFRAN -ODT) 4 MG disintegrating tablet Take  1 tablet (4 mg total) by mouth every 8 (eight) hours as needed for refractory nausea / vomiting. 20 tablet 0  . pantoprazole  (PROTONIX ) 40 MG tablet Take 1 tablet (40 mg total) by mouth 2 (two) times daily before a meal. 60 tablet 5  . Prenat-FeCbn-FeAsp-Meth-FA-DHA (PRENATE MINI ) 18-0.6-0.4-350 MG CAPS Take 1 capsule by mouth daily before breakfast. 90 capsule 3  . sertraline  (ZOLOFT ) 100 MG tablet Take 1 tablet (100 mg total) by mouth daily. 90 tablet 1  . SYMBICORT  80-4.5 MCG/ACT inhaler Inhale 2 puffs into the lungs 2 (two) times daily as needed (shortness of breath). 10.2 g 10  . albuterol  (VENTOLIN  HFA) 108 (90 Base) MCG/ACT inhaler Inhale 1-2 puffs into the lungs every 6 (six) hours as needed for wheezing or shortness of breath. 18 g 11  . Blood Pressure Monitoring (BLOOD PRESSURE KIT) DEVI 1 Device by Does not apply route once a week. 1 each 0  . Doxylamine -Pyridoxine  (DICLEGIS ) 10-10 MG TBEC Take 1 tablet by mouth See admin instructions. (Diclegis (R)) Initial, 2 tablets (doxylamine  succinate 10 mg/pyridoxine  hydrochloride 10 mg per tablet) orally at bedtime on day 1 and 2; if symptoms persist, take 1 tablet in morning and 2 tablets at bedtime on day 3; if symptoms persist, may increase to MAX 4 tablets per day, administered as 1 tablet in the morning, 1 tablet in mid-afternoon and 2 tablets at bedtime 60 tablet 0  . EPINEPHrine  (EPIPEN  2-PAK) 0.3 mg/0.3 mL IJ SOAJ injection Inject 0.3 mg into the muscle once as needed (for severe allergic reaction). CAll 911 immediately if you have to use this medicine 2 each 1  . metoCLOPramide  (REGLAN ) 10 MG tablet Take 1 tablet (10 mg total) by mouth 3 (three) times daily before meals. 90 tablet 0  . polyethylene glycol powder (MIRALAX ) 17 GM/SCOOP powder Take 17 g by mouth 2 (two) times daily. Dissolve 1 capful (17g) in 4-8 ounces of liquid and take by mouth daily. 238 g 0  . terconazole  (TERAZOL 3 ) 0.8 % vaginal cream Place 1 applicator vaginally at bedtime.  20 g 0    Pertinent positives and negative per HPI, all others reviewed  and negative  Physical Exam   BP 137/88 (BP Location: Right Arm)   Pulse 99   Temp 99.5 F (37.5 C) (Oral)   Resp 18   Ht 5' 5 (1.651 m)   Wt (!) 149.2 kg   LMP 09/03/2023 (Approximate)   SpO2 98%   BMI 54.73 kg/m   Patient Vitals for the past 24 hrs:  BP Temp Temp src Pulse Resp SpO2 Height Weight  11/29/23 1928 137/88 99.5 F (37.5 C) Oral 99 18 98 % 5' 5 (1.651 m) (!) 149.2 kg    Physical Exam Vitals and nursing note reviewed.  Constitutional:      Appearance: She is well-developed.  HENT:     Head: Normocephalic and atraumatic.     Mouth/Throat:     Mouth: Mucous membranes are moist.  Eyes:     Extraocular Movements: Extraocular movements intact.  Cardiovascular:     Rate and Rhythm: Normal rate and regular rhythm.  Pulmonary:     Effort: Pulmonary effort is normal.  Abdominal:     Palpations: Abdomen is soft.     Tenderness: There is no abdominal tenderness.  Skin:    Capillary Refill: Capillary refill takes less than 2 seconds.  Neurological:     General: No focal deficit present.     Mental Status: She is alert.     Labs Results for orders placed or performed during the hospital encounter of 11/29/23 (from the past 24 hours)  Urinalysis, Routine w reflex microscopic -Urine, Clean Catch     Status: None   Collection Time: 11/29/23  7:32 PM  Result Value Ref Range   Color, Urine YELLOW YELLOW   APPearance CLEAR CLEAR   Specific Gravity, Urine 1.027 1.005 - 1.030   pH 5.0 5.0 - 8.0   Glucose, UA NEGATIVE NEGATIVE mg/dL   Hgb urine dipstick NEGATIVE NEGATIVE   Bilirubin Urine NEGATIVE NEGATIVE   Ketones, ur NEGATIVE NEGATIVE mg/dL   Protein, ur NEGATIVE NEGATIVE mg/dL   Nitrite NEGATIVE NEGATIVE   Leukocytes,Ua NEGATIVE NEGATIVE   *Note: Due to a large number of results and/or encounters for the requested time period, some results have not been displayed. A complete set of  results can be found in Results Review.   MAU Course  Procedures Lab Orders         Urinalysis, Routine w reflex microscopic -Urine, Clean Catch    Meds ordered this encounter  Medications  . acetaminophen  (TYLENOL ) tablet 1,000 mg  . cyclobenzaprine  (FLEXERIL ) tablet 10 mg  . caffeine  tablet 200 mg  . lactated ringers  bolus 1,000 mL  . diphenhydrAMINE  (BENADRYL ) injection 25 mg  . prochlorperazine  (COMPAZINE ) injection 10 mg  . cyclobenzaprine  (FLEXERIL ) 10 MG tablet    Sig: Take 1 tablet (10 mg total) by mouth 2 (two) times daily as needed.    Dispense:  30 tablet    Refill:  0    Supervising Provider:   PRATT, TANYA S [[id number]]   Imaging Orders  No imaging studies ordered today    MDM Moderate (Level 3-4)  Ordered tylenol  and flexeril . Care handed over to [facility] CMN at 2000.   Colter L Cashion, MD/ 11/29/23 9:06 PM  Reassessment @ 2100: Patient reports back pain has improved, but H/A is still 10/10. Requesting more medication for H/A. Reassessment @ 2225: H/A lessened after additional medications and IVFs. ~100 mL of IVFs left to infuse.  LR Bolus 1 liter Benadryl  25 mg IVP Compazine  10 mg IVP Caffeine   200 mg po   Assessment and Plan    Shaunte Weissinger, CNM  11/29/2023 9:14 PM

## 2023-11-30 MED ORDER — PHENYLEPHRINE HCL-NACL 20-0.9 MG/250ML-% IV SOLN
INTRAVENOUS | Status: AC
  Filled 2023-11-30: qty 1000

## 2023-11-30 MED ORDER — PROPOFOL 1000 MG/100ML IV EMUL
INTRAVENOUS | Status: AC
  Filled 2023-11-30: qty 300

## 2023-12-03 ENCOUNTER — Encounter: Admitting: Obstetrics and Gynecology

## 2023-12-03 ENCOUNTER — Encounter: Payer: Self-pay | Admitting: Family Medicine

## 2023-12-05 ENCOUNTER — Encounter: Payer: Self-pay | Admitting: Obstetrics and Gynecology

## 2023-12-05 ENCOUNTER — Ambulatory Visit (INDEPENDENT_AMBULATORY_CARE_PROVIDER_SITE_OTHER): Admitting: Obstetrics and Gynecology

## 2023-12-05 VITALS — BP 104/72 | HR 96 | Wt 324.0 lb

## 2023-12-05 DIAGNOSIS — E66813 Obesity, class 3: Secondary | ICD-10-CM

## 2023-12-05 DIAGNOSIS — Z87898 Personal history of other specified conditions: Secondary | ICD-10-CM | POA: Diagnosis not present

## 2023-12-05 DIAGNOSIS — O0990 Supervision of high risk pregnancy, unspecified, unspecified trimester: Secondary | ICD-10-CM

## 2023-12-05 DIAGNOSIS — Z3A13 13 weeks gestation of pregnancy: Secondary | ICD-10-CM

## 2023-12-05 DIAGNOSIS — Z131 Encounter for screening for diabetes mellitus: Secondary | ICD-10-CM | POA: Diagnosis not present

## 2023-12-05 DIAGNOSIS — Z6841 Body Mass Index (BMI) 40.0 and over, adult: Secondary | ICD-10-CM

## 2023-12-05 MED ORDER — ASPIRIN 81 MG PO TBEC: 81.0000 mg | DELAYED_RELEASE_TABLET | Freq: Every day | ORAL | 2 refills | Status: AC

## 2023-12-05 NOTE — Progress Notes (Signed)
 ROB, c/o dizziness, lightheaded x1+ month.

## 2023-12-05 NOTE — Progress Notes (Signed)
 PRENATAL VISIT NOTE  Subjective:  Latoya Stark is a 28 y.o. [id number] at [redacted]w[redacted]d being seen today for ongoing prenatal care.  She is currently monitored for the following issues for this high-risk pregnancy and has Other constipation; History of seizure; Conversion disorder; BMI 50.0-59.9, adult (HCC); Chronic migraine without aura without status migrainosus, not intractable; Dysphagia; Lipoma of neck; Mild persistent asthma without complication; Anemia; Mild episode of recurrent major depressive disorder; Anxiety; Class 3 severe obesity due to excess calories with serious comorbidity and body mass index (BMI) of 50.0 to 59.9 in adult [facility] Chronic midline low back pain with bilateral sciatica; Supervision of high risk pregnancy, antepartum; and Numbness of hand on their problem list.  Patient reports frequent near syncopal episodes at home and while driving.  Contractions: Not present. Vag. Bleeding: None.  Movement: Present. Denies leaking of fluid.   The following portions of the patient's history were reviewed and updated as appropriate: allergies, current medications, past family history, past medical history, past social history, past surgical history and problem list.   Objective:   Vitals:   12/05/23 0919  BP: 104/72  Pulse: 96  Weight: (!) 324 lb (147 kg)    Fetal Status:      Movement: Present    General: Alert, oriented and cooperative. Patient is in no acute distress.  Skin: Skin is warm and dry. No rash noted.   Cardiovascular: Normal heart rate noted  Respiratory: Normal respiratory effort, no problems with respiration noted  Abdomen: Soft, gravid, appropriate for gestational age.  Pain/Pressure: Present     Pelvic: Cervical exam deferred        Extremities: Normal range of motion.  Edema: Trace  Mental Status: Normal mood and affect. Normal behavior. Normal judgment and thought content.      11/05/2023   10:08 AM 09/21/2023   10:09 AM 08/23/2023    8:51 AM   Depression screen PHQ 2/9  Decreased Interest 1 0 0  Down, Depressed, Hopeless 1 0 0  PHQ - 2 Score 2 0 0  Altered sleeping 0 0 3  Tired, decreased energy 0 0 0  Change in appetite 0 0 2  Feeling bad or failure about yourself  0 0 2  Trouble concentrating 0 0 0  Moving slowly or fidgety/restless 1 0 0  Suicidal thoughts 0 0 0  PHQ-9 Score 3  0  7   Difficult doing work/chores  Not difficult at all Not difficult at all     Data saved with a previous flowsheet row definition        11/05/2023   10:09 AM 09/21/2023   10:12 AM 08/23/2023    8:54 AM 07/17/2023    8:59 AM  GAD 7 : Generalized Anxiety Score  Nervous, Anxious, on Edge 1 0 2 1  Control/stop worrying 3 0 3 3  Worry too much - different things 3 0 3 2  Trouble relaxing 1 0 1 3  Restless 0 0 0 3  Easily annoyed or irritable 1 0 3 0  Afraid - awful might happen 1 0 0 0  Total GAD 7 Score 10 0 12 12  Anxiety Difficulty  Not difficult at all Somewhat difficult Not difficult at all    Assessment and Plan:  Pregnancy: [id number] at [redacted]w[redacted]d 1. Supervision of high risk pregnancy, antepartum (Primary) Initial labs ordered today Patient referred to cardiology for near syncopal episode evaluations. Patient advised to increase fluid intake and to always carry a snack  with her - CBC/D/Plt+RPR+Rh+ABO+RubIgG... - HORIZON Basic Panel - PANORAMA PRENATAL TEST - AMB Referral to Cardio Obstetrics  2. [redacted] weeks gestation of pregnancy - CBC/D/Plt+RPR+Rh+ABO+RubIgG... - HORIZON Basic Panel - PANORAMA PRENATAL TEST  3. Screening for diabetes mellitus - HgB A1c  4. History of seizure Continue Keppra  and Lamictal  Last seizure in August  5. Class 3 severe obesity due to excess calories with serious comorbidity and body mass index (BMI) of 50.0 to 59.9 in adult [facility]) Continue ASA  Preterm labor symptoms and general obstetric precautions including but not limited to vaginal bleeding, contractions, leaking of fluid and fetal movement  were reviewed in detail with the patient. Please refer to After Visit Summary for other counseling recommendations.   Return in about 4 weeks (around 01/02/2024) for in person, ROB, High risk.  Future Appointments  Date Time Provider Department Center  01/24/2024 10:00 AM [facility] PROVIDER 1 [facility]  01/24/2024 10:30 AM [facility] US1 [facility]  02/05/2024 11:00 AM Lomax, Amy, NP [facility] None  07/23/2024  9:45 AM [facility] LAB [facility] None  07/23/2024 10:20 AM Federico Norleen ONEIDA MADISON, MD [facility] None    Winton Felt, MD

## 2023-12-06 LAB — CBC/D/PLT+RPR+RH+ABO+RUBIGG...
Antibody Screen: NEGATIVE
Basophils Absolute: 0 x10E3/uL (ref 0.0–0.2)
Basos: 0 %
EOS (ABSOLUTE): 0.1 x10E3/uL (ref 0.0–0.4)
Eos: 2 %
HCV Ab: NONREACTIVE
HIV Screen 4th Generation wRfx: NONREACTIVE
Hematocrit: 36.5 % (ref 34.0–46.6)
Hemoglobin: 12 g/dL (ref 11.1–15.9)
Hepatitis B Surface Ag: NEGATIVE
Immature Grans (Abs): 0 x10E3/uL (ref 0.0–0.1)
Immature Granulocytes: 0 %
Lymphocytes Absolute: 1.3 x10E3/uL (ref 0.7–3.1)
Lymphs: 24 %
MCH: 28.9 pg (ref 26.6–33.0)
MCHC: 32.9 g/dL (ref 31.5–35.7)
MCV: 88 fL (ref 79–97)
Monocytes Absolute: 0.4 x10E3/uL (ref 0.1–0.9)
Monocytes: 8 %
Neutrophils Absolute: 3.6 x10E3/uL (ref 1.4–7.0)
Neutrophils: 66 %
Platelets: 337 x10E3/uL (ref 150–450)
RBC: 4.15 x10E6/uL (ref 3.77–5.28)
RDW: 14.1 % (ref 11.7–15.4)
RPR Ser Ql: NONREACTIVE
Rh Factor: POSITIVE
Rubella Antibodies, IGG: 1.4 {index} (ref >0.99)
WBC: 5.5 x10E3/uL (ref 3.4–10.8)

## 2023-12-06 LAB — HCV INTERPRETATION

## 2023-12-06 LAB — HEMOGLOBIN A1C
Est. average glucose Bld gHb Est-mCnc: 111 mg/dL
Hgb A1c MFr Bld: 5.5 % (ref 4.8–5.6)

## 2023-12-07 ENCOUNTER — Encounter: Payer: Self-pay | Admitting: Medical Oncology

## 2023-12-11 LAB — HORIZON CUSTOM

## 2023-12-12 LAB — PANORAMA PRENATAL TEST FULL PANEL:PANORAMA TEST PLUS 5 ADDITIONAL MICRODELETIONS: FETAL FRACTION: 6.8

## 2023-12-17 ENCOUNTER — Encounter: Payer: Self-pay | Admitting: Obstetrics

## 2023-12-19 ENCOUNTER — Other Ambulatory Visit: Payer: Self-pay

## 2023-12-19 MED FILL — Cetirizine HCl Tab 10 MG: ORAL | 30 days supply | Qty: 30 | Fill #0 | Status: AC

## 2023-12-19 NOTE — Telephone Encounter (Signed)
 Pt called stating that she has been calling [facility] all day and they just pick up and hang up on her. Please advise.

## 2023-12-20 ENCOUNTER — Other Ambulatory Visit: Payer: Self-pay

## 2023-12-20 ENCOUNTER — Encounter: Payer: Self-pay | Admitting: Obstetrics

## 2023-12-20 ENCOUNTER — Ambulatory Visit (INDEPENDENT_AMBULATORY_CARE_PROVIDER_SITE_OTHER): Admitting: Obstetrics

## 2023-12-20 ENCOUNTER — Inpatient Hospital Stay (HOSPITAL_COMMUNITY)
Admission: AD | Admit: 2023-12-20 | Discharge: 2023-12-20 | Disposition: A | Discharge status: Home / Self Care | Attending: Obstetrics and Gynecology | Admitting: Obstetrics and Gynecology

## 2023-12-20 VITALS — BP 136/84 | HR 95 | Wt 326.1 lb

## 2023-12-20 DIAGNOSIS — O99891 Other specified diseases and conditions complicating pregnancy: Secondary | ICD-10-CM

## 2023-12-20 DIAGNOSIS — F330 Major depressive disorder, recurrent, mild: Secondary | ICD-10-CM

## 2023-12-20 DIAGNOSIS — F449 Dissociative and conversion disorder, unspecified: Secondary | ICD-10-CM | POA: Diagnosis not present

## 2023-12-20 DIAGNOSIS — O99212 Obesity complicating pregnancy, second trimester: Secondary | ICD-10-CM

## 2023-12-20 DIAGNOSIS — R42 Dizziness and giddiness: Secondary | ICD-10-CM

## 2023-12-20 DIAGNOSIS — F419 Anxiety disorder, unspecified: Secondary | ICD-10-CM | POA: Diagnosis not present

## 2023-12-20 DIAGNOSIS — O0992 Supervision of high risk pregnancy, unspecified, second trimester: Secondary | ICD-10-CM | POA: Diagnosis not present

## 2023-12-20 DIAGNOSIS — O99210 Obesity complicating pregnancy, unspecified trimester: Secondary | ICD-10-CM

## 2023-12-20 DIAGNOSIS — Z87898 Personal history of other specified conditions: Secondary | ICD-10-CM

## 2023-12-20 DIAGNOSIS — G43709 Chronic migraine without aura, not intractable, without status migrainosus: Secondary | ICD-10-CM | POA: Diagnosis not present

## 2023-12-20 DIAGNOSIS — Z3A15 15 weeks gestation of pregnancy: Secondary | ICD-10-CM | POA: Diagnosis not present

## 2023-12-20 DIAGNOSIS — O0990 Supervision of high risk pregnancy, unspecified, unspecified trimester: Secondary | ICD-10-CM

## 2023-12-20 LAB — CBC WITH DIFFERENTIAL/PLATELET
Abs Immature Granulocytes: 0.03 K/uL (ref 0.00–0.07)
Basophils Absolute: 0 K/uL (ref 0.0–0.1)
Basophils Relative: 0 %
Eosinophils Absolute: 0.1 K/uL (ref 0.0–0.5)
Eosinophils Relative: 2 %
HCT: 35.9 % — ABNORMAL LOW (ref 36.0–46.0)
Hemoglobin: 12.1 g/dL (ref 12.0–15.0)
Immature Granulocytes: 1 %
Lymphocytes Relative: 23 %
Lymphs Abs: 1.4 K/uL (ref 0.7–4.0)
MCH: 29.4 pg (ref 26.0–34.0)
MCHC: 33.7 g/dL (ref 30.0–36.0)
MCV: 87.3 fL (ref 80.0–100.0)
Monocytes Absolute: 0.4 K/uL (ref 0.1–1.0)
Monocytes Relative: 7 %
Neutro Abs: 4 K/uL (ref 1.7–7.7)
Neutrophils Relative %: 67 %
Platelets: 331 K/uL (ref 150–400)
RBC: 4.11 MIL/uL (ref 3.87–5.11)
RDW: 13.5 % (ref 11.5–15.5)
WBC: 5.9 K/uL (ref 4.0–10.5)
nRBC: 0 % (ref 0.0–0.2)

## 2023-12-20 LAB — BASIC METABOLIC PANEL WITH GFR
Anion gap: 11 (ref 5–15)
BUN: 5 mg/dL — ABNORMAL LOW (ref 6–20)
CO2: 24 mmol/L (ref 22–32)
Calcium: 9.1 mg/dL (ref 8.9–10.3)
Chloride: 99 mmol/L (ref 98–111)
Creatinine, Ser: 0.53 mg/dL (ref 0.44–1.00)
GFR, Estimated: >60 mL/min (ref >60)
Glucose, Bld: 89 mg/dL (ref 70–99)
Potassium: 3.6 mmol/L (ref 3.5–5.1)
Sodium: 134 mmol/L — ABNORMAL LOW (ref 135–145)

## 2023-12-20 LAB — MAGNESIUM: Magnesium: 1.7 mg/dL (ref 1.7–2.4)

## 2023-12-20 LAB — TSH: TSH: 1.765 u[IU]/mL (ref 0.350–4.500)

## 2023-12-20 MED ORDER — METOCLOPRAMIDE HCL 10 MG PO TABS: 10.0000 mg | ORAL_TABLET | Freq: Three times a day (TID) | ORAL | 0 refills | Status: AC | PRN

## 2023-12-20 MED ORDER — CYCLOBENZAPRINE HCL 5 MG PO TABS
10.0000 mg | ORAL_TABLET | Freq: Once | ORAL | Status: AC
  Administered 2023-12-20: 10 mg via ORAL
  Filled 2023-12-20: qty 2

## 2023-12-20 MED ORDER — EXCEDRIN TENSION HEADACHE 500-65 MG PO TABS: 2.0000 | ORAL_TABLET | Freq: Four times a day (QID) | ORAL | 2 refills | Status: AC | PRN

## 2023-12-20 MED ORDER — DIPHENHYDRAMINE HCL 25 MG PO CAPS
50.0000 mg | ORAL_CAPSULE | Freq: Once | ORAL | Status: AC
  Administered 2023-12-20: 50 mg via ORAL
  Filled 2023-12-20: qty 2

## 2023-12-20 MED ORDER — MECLIZINE HCL 25 MG PO TABS: 25.0000 mg | ORAL_TABLET | Freq: Three times a day (TID) | ORAL | 5 refills | Status: AC | PRN

## 2023-12-20 MED ORDER — DIPHENHYDRAMINE HCL 25 MG PO TABS: 25.0000 mg | ORAL_TABLET | Freq: Four times a day (QID) | ORAL | 2 refills | Status: AC | PRN

## 2023-12-20 MED ORDER — METOCLOPRAMIDE HCL 10 MG PO TABS
10.0000 mg | ORAL_TABLET | Freq: Once | ORAL | Status: AC
  Administered 2023-12-20: 10 mg via ORAL
  Filled 2023-12-20: qty 1

## 2023-12-20 MED ORDER — ACETAMINOPHEN-CAFFEINE 500-65 MG PO TABS
2.0000 | ORAL_TABLET | Freq: Once | ORAL | Status: AC
  Administered 2023-12-20: 2 via ORAL
  Filled 2023-12-20: qty 2

## 2023-12-20 NOTE — Discharge Instructions (Addendum)
 You can repeat the Excedrin Tension and Reglan  at 4pm. You can repeat the Benadryl  at 6pm.  HEADACHE: HEADACHE PREVENTION Start a magnesium  supplement daily. Magnesium  Oxide or Magnesium  Glycinate 500 mg at bed (up to 800 mg daily) Aim for drinking 64-100 ounces of water  daily. Eat high protein meals/snacks every 3 hours to keep your blood sugar steady. Maintain a headache diary; learn to identify and avoid triggers.  - This can be a simple note where you log when you had a headache, associated symptoms, and medications used - There are several smartphone apps developed to help track migraines: Migraine Buddy, Migraine Monitor, Curelator N1-Headache App HEADACHE TREATMENT If you get a headache at home, I recommend doing the following RIGHT AWAY: 1) Drink 64 oz of water . Make sure you have something to eat. 2) Take metoclopramide  (Reglan ) 10 mg PLUS 1000mg  Tylenol  (2 extra-strength pills). Alternatively, instead of Tylenol  you can take 2 tablets of Excedrin-Tension (NOT Excedrin migraine). Look for TENSION headache formulation. The main ingredients should be Acetaminophen  (same as Tylenol ) and Caffeine ; should NOT have Aspirin  in the formulation  3) If you need to you can also take Benadryl . This will likely make you very sleepy. Please take a nap as sleep can help headaches go away.   Follow up with your neurologist as scheduled. Talk with your OB at your next visit as well to let them know how you are feeling.  VERTIGO: The episodes of feeling like the room is spinning sounds like vertigo. This could be due to the migraines, or it may be related to the inner ear.  In addition to preventing and treating your migraines, if you do have episodes of dizziness you can take 25-50 mg of Benadryl  every 6 hours on an as-needed basis for the dizziness. If the dizziness does not improve and cardiology does not find any heart-related issues, let you OB know and we may need a referral to ENT.

## 2023-12-20 NOTE — MAU Provider Note (Signed)
 Chief Complaint:  Headache (With Dizziness, for 4 weeks. Took flexeril  last night. )   HPI   None     Latoya Stark is a 28 y.o. [id number] at [redacted]w[redacted]d who presents to maternity admissions reporting headache and dizziness. She reports symptoms have been occurring intermittently for the past 4 weeks. She has a long history of migraine headaches and is followed by neurology. She took Flexeril  last night but has not taken any medication for headache or pain this morning. Headache today is 10/10 located bilaterally in her temples with associated phonophobia and photophobia. She reports this morning she went to shower and felt like the room was spinning, which made her feel like she was going to pass out. She reports similar episodes of feeling like the room is spinning with increasing frequency over the past month or so. She also endorses 2 episodes of emesis this morning.  Pregnancy Course: Receives care at [facility] for [facility] . Prenatal records reviewed. Pregnancy complicated by asthma, chronic low back pain, chronic migraine, history of seizure, BMI >50. Upcoming appointments with cardiology on 12/28/2023 and neurology on 02/05/2024.  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   started at age 8   Headache    Obesity    Pneumonia    Postpartum depression, postpartum condition 08/11/2021   Seizures (HCC)    when I get too hot   Trichomonas infection    Umbilical  hernia 04/2022   UTI (lower urinary tract infection)    Vulvar abscess 08/11/2021   OB History  Gravida Para Term Preterm AB Living  9 2 2  6 2   SAB IAB Ectopic Multiple Live Births  4 1  0 2    # Outcome Date GA Lbr Len/2nd Weight Sex Type Anes PTL Lv  9 Current           8 Term 06/22/21 [redacted]w[redacted]d 03:19 / 00:04 3190 g M Vag-Spont EPI  LIV  7 AB 10/08/19 [redacted]w[redacted]d    SAB     6 Term 03/01/19 [redacted]w[redacted]d / 00:07 3455 g M Vag-Spont EPI  LIV  5 SAB 10/10/14          4 SAB           3 IAB           2 SAB           1 SAB            Past Surgical History:  Procedure Laterality Date   COLONOSCOPY N/A 05/22/2023   Procedure: COLONOSCOPY;  Surgeon: Therisa Bi, MD;  Location: [facility] ENDOSCOPY;  Service: Gastroenterology;  Laterality: N/A;   DILATION AND EVACUATION N/A 08/09/2013   Procedure: DILATATION AND EVACUATION;  Surgeon: Aida DELENA Na, MD;  Location: [facility] ORS;  Service: Gynecology;  Laterality: N/A;   DILATION AND EVACUATION  2017   ESOPHAGOGASTRODUODENOSCOPY (EGD) WITH PROPOFOL  N/A  11/10/2022   Procedure: ESOPHAGOGASTRODUODENOSCOPY (EGD) WITH PROPOFOL ;  Surgeon: Therisa Bi, MD;  Location: [facility] ENDOSCOPY;  Service: Gastroenterology;  Laterality: N/A;   EUSTACHIAN TUBE DILATION     EXCISION MASS NECK N/A 11/16/2022   Procedure: EXCISION MASS NECK;  Surgeon: Jordis Laneta FALCON, MD;  Location: [facility] ORS;  Service: General;  Laterality: N/A;  JP drain placed   LACERATION REPAIR Left 2007   5th toe   ORTHOPEDIC SURGERY Left 2010   hand fracture; screws in place   TONSILLECTOMY AND ADENOIDECTOMY  2001   TYMPANOSTOMY TUBE PLACEMENT Bilateral 2002   UMBILICAL HERNIA REPAIR N/A 05/12/2022   Procedure: LAPAROSCOPIC ASSISTED SUPRA UMBILICAL HERNIA REPAIR;  Surgeon: Gladis Cough, MD;  Location: [facility] ORS;  Service: General;  Laterality: N/A;   Family History  Problem Relation Age of Onset   Heart disease Mother    Depression Mother    Cancer Mother        Bone cancer   Seizures Mother    Stroke Mother     Sickle cell anemia Mother    Hyperlipidemia Father    Heart disease Father    Hypertension Father    Sleep apnea Father    Seizures Maternal Aunt    Diabetes Maternal Grandmother    Colon cancer Maternal Grandmother    Hypertension Maternal Grandmother    Clotting disorder Maternal Grandmother    Breast cancer Maternal Grandmother    Stroke Maternal Grandmother    Diabetes Paternal Grandmother    Social History[1] Allergies[2] Medications Prior to Admission  Medication Sig Dispense Refill Last Dose/Taking   busPIRone  (BUSPAR ) 5 MG tablet Take 1 tablet (5 mg total) by mouth 3 (three) times daily. 90 tablet 0 12/20/2023 Morning   cetirizine  (ZYRTEC ) 10 MG tablet Take 1 tablet (10 mg total) by mouth daily. 30 tablet 0 12/20/2023 Morning   cyclobenzaprine  (FLEXERIL ) 10 MG tablet Take 1 tablet (10 mg total) by mouth 2 (two) times daily as needed. 30 tablet 0 12/19/2023 at  6:30 PM   gabapentin  (NEURONTIN ) 100 MG capsule Take 2 capsules (200 mg total) by mouth at bedtime. 180 capsule 0 12/19/2023   hydrOXYzine  (VISTARIL ) 25 MG capsule TAKE 1 CAPSULE (25 MG TOTAL) BY MOUTH AT BEDTIME AS NEEDED FOR ANXIETY. 90 capsule 0 Past Week   lamoTRIgine  (LAMICTAL ) 200 MG tablet Take 1 tablet (200 mg total) by mouth 3 (three) times daily. 90 tablet 0 12/20/2023 Morning   levETIRAcetam  (KEPPRA ) 750 MG tablet Take 2 tablets (1,500 mg total) by mouth 2 (two) times daily. 120 tablet 0 12/20/2023 Morning   ondansetron  (ZOFRAN -ODT) 4 MG disintegrating tablet Take 1 tablet (4 mg total) by mouth every 8 (eight) hours as needed for refractory nausea / vomiting. 20 tablet 0 Past Week   pantoprazole  (PROTONIX ) 40 MG tablet Take 1 tablet (40 mg total) by mouth 2 (two) times daily before a meal. 60 tablet 5 12/20/2023 Morning   Prenat-FeCbn-FeAsp-Meth-FA-DHA (PRENATE MINI ) 18-0.6-0.4-350 MG CAPS Take 1 capsule by mouth daily before breakfast. 90 capsule 3 12/20/2023 Morning   sertraline  (ZOLOFT ) 100 MG tablet Take 1  tablet (100 mg total) by mouth daily. 90 tablet 1 12/20/2023 Morning   SYMBICORT  80-4.5 MCG/ACT inhaler Inhale 2 puffs into the lungs 2 (two) times daily as needed (shortness of breath). 10.2 g 10 12/19/2023   terconazole  (TERAZOL 3 ) 0.8 % vaginal cream Place 1 applicator vaginally at bedtime. 20 g 0 Past Month   [DISCONTINUED] metoCLOPramide  (REGLAN ) 10 MG tablet Take 1 tablet (10  mg total) by mouth 3 (three) times daily before meals. 90 tablet 0 12/19/2023   albuterol  (VENTOLIN  HFA) 108 (90 Base) MCG/ACT inhaler Inhale 1-2 puffs into the lungs every 6 (six) hours as needed for wheezing or shortness of breath. 18 g 11 More than a month   aspirin  EC 81 MG tablet Take 1 tablet (81 mg total) by mouth daily. Take after 12 weeks for prevention of preeclampsia later in pregnancy 300 tablet 2 More than a month   Blood Pressure Monitoring (BLOOD PRESSURE KIT) DEVI 1 Device by Does not apply route once a week. 1 each 0 More than a month   Doxylamine -Pyridoxine  (DICLEGIS ) 10-10 MG TBEC Take 1 tablet by mouth See admin instructions. (Diclegis (R)) Initial, 2 tablets (doxylamine  succinate 10 mg/pyridoxine  hydrochloride 10 mg per tablet) orally at bedtime on day 1 and 2; if symptoms persist, take 1 tablet in morning and 2 tablets at bedtime on day 3; if symptoms persist, may increase to MAX 4 tablets per day, administered as 1 tablet in the morning, 1 tablet in mid-afternoon and 2 tablets at bedtime 60 tablet 0 Unknown   EPINEPHrine  (EPIPEN  2-PAK) 0.3 mg/0.3 mL IJ SOAJ injection Inject 0.3 mg into the muscle once as needed (for severe allergic reaction). CAll 911 immediately if you have to use this medicine 2 each 1 Unknown   polyethylene glycol powder (MIRALAX ) 17 GM/SCOOP powder Take 17 g by mouth 2 (two) times daily. Dissolve 1 capful (17g) in 4-8 ounces of liquid and take by mouth daily. 238 g 0 More than a month    I have reviewed patient's Past Medical Hx, Surgical Hx, Family Hx, Social Hx, medications and  allergies.   ROS  Pertinent items noted in HPI and remainder of comprehensive ROS otherwise negative.   PHYSICAL EXAM  Patient Vitals for the past 24 hrs:  BP Temp Temp src Pulse Resp SpO2 Height Weight  12/20/23 0926 118/65 98.3 F (36.8 C) Oral 92 17 100 % 5' 5 (1.651 m) (!) 147.4 kg    Constitutional: Well-developed, well-nourished female in no acute distress.  HEENT: atraumatic, normocephalic. Neck has normal ROM. EOM intact. Cardiovascular: normal rate & rhythm, warm and well-perfused Respiratory: normal effort, no problems with respiration noted GI: Abd soft, non-tender, non-distended MSK: Extremities nontender, no edema, normal ROM Skin: warm and dry. Acyanotic, no jaundice or pallor. Neurologic: Alert and oriented x 4. No abnormal coordination. No nystagmus. Dizziness induced by EOM. Psychiatric: Normal mood. Speech not slurred, not rapid/pressured. Patient is cooperative.  Labs: Results for orders placed or performed during the hospital encounter of 12/20/23 (from the past 24 hours)  CBC with Differential/Platelet     Status: Abnormal   Collection Time: 12/20/23 10:18 AM  Result Value Ref Range   WBC 5.9 4.0 - 10.5 K/uL   RBC 4.11 3.87 - 5.11 MIL/uL   Hemoglobin 12.1 12.0 - 15.0 g/dL   HCT 64.0 (L) 63.9 - 53.9 %   MCV 87.3 80.0 - 100.0 fL   MCH 29.4 26.0 - 34.0 pg   MCHC 33.7 30.0 - 36.0 g/dL   RDW 86.4 88.4 - 84.4 %   Platelets 331 150 - 400 K/uL   nRBC 0.0 0.0 - 0.2 %   Neutrophils Relative % 67 %   Neutro Abs 4.0 1.7 - 7.7 K/uL   Lymphocytes Relative 23 %   Lymphs Abs 1.4 0.7 - 4.0 K/uL   Monocytes Relative 7 %   Monocytes Absolute 0.4 0.1 - 1.0 K/uL  Eosinophils Relative 2 %   Eosinophils Absolute 0.1 0.0 - 0.5 K/uL   Basophils Relative 0 %   Basophils Absolute 0.0 0.0 - 0.1 K/uL   Immature Granulocytes 1 %   Abs Immature Granulocytes 0.03 0.00 - 0.07 K/uL  Magnesium      Status: None   Collection Time: 12/20/23 10:18 AM  Result Value Ref Range    Magnesium  1.7 1.7 - 2.4 mg/dL  TSH     Status: None   Collection Time: 12/20/23 10:18 AM  Result Value Ref Range   TSH 1.765 0.350 - 4.500 uIU/mL  Basic metabolic panel     Status: Abnormal   Collection Time: 12/20/23 10:18 AM  Result Value Ref Range   Sodium 134 (L) 135 - 145 mmol/L   Potassium 3.6 3.5 - 5.1 mmol/L   Chloride 99 98 - 111 mmol/L   CO2 24 22 - 32 mmol/L   Glucose, Bld 89 70 - 99 mg/dL   BUN 5 (L) 6 - 20 mg/dL   Creatinine, Ser 9.46 0.44 - 1.00 mg/dL   Calcium  9.1 8.9 - 10.3 mg/dL   GFR, Estimated >39 >39 mL/min   Anion gap 11 5 - 15   *Note: Due to a large number of results and/or encounters for the requested time period, some results have not been displayed. A complete set of results can be found in Results Review.    Imaging:  No results found.  EKG: EKG: unchanged from previous tracing from April 2025, normal sinus rhythm. I personally reviewed this EKG for intrepretation.  MDM & MAU COURSE  MDM: Moderate  MAU Course: -Vital signs within normal limits. -EKG to rule out arrhythmia. -CBC for anemia, BMP for electrolytes especially with history of nausea/vomiting this pregnancy, magnesium  given headache and history of n/v, TSH for thyroid  disease. -Sensation of room spinning and dizziness with EOM on exam suspicious for vertigo caused by migraine or BPPV. -EKG with normal sinus rhythm and no changes from previous on 04/19/2023. Follow up with cardiology as scheduled. -Magnesium  within normal limits. -TSH within normal limits. -CMP within normal limits for pregnancy. -CBC within normal limits.  Differential diagnosis considered for headache includes but is not limited to: preeclampsia, tension headache, cluster, trauma, concussion, migraine, CVA/SAH, viral syndrome Differential diagnosis considered for dizziness includes but is not limited to: hypoglycemia, viral syndrome, BPPV, cerumen impaction/foreign body, arrhythmia, migraine, anemia, thyroid   disease  Orders Placed This Encounter  Procedures   CBC with Differential/Platelet   Magnesium    TSH   Basic metabolic panel   ED EKG   Discharge patient   Meds ordered this encounter  Medications   acetaminophen -caffeine  (EXCEDRIN TENSION HEADACHE) 500-65 MG per tablet 2 tablet   cyclobenzaprine  (FLEXERIL ) tablet 10 mg   metoCLOPramide  (REGLAN ) tablet 10 mg   diphenhydrAMINE  (BENADRYL ) capsule 50 mg   metoCLOPramide  (REGLAN ) 10 MG tablet    Sig: Take 1 tablet (10 mg total) by mouth every 8 (eight) hours as needed (for nausea or headache).    Dispense:  90 tablet    Refill:  0   acetaminophen -caffeine  (EXCEDRIN TENSION HEADACHE) 500-65 MG TABS per tablet    Sig: Take 2 tablets by mouth every 6 (six) hours as needed (for headache).    Dispense:  60 tablet    Refill:  2   diphenhydrAMINE  (BENADRYL ) 25 MG tablet    Sig: Take 1-2 tablets (25-50 mg total) by mouth every 6 (six) hours as needed (for headache or dizziness).  Dispense:  60 tablet    Refill:  2    ASSESSMENT   1. Chronic migraine without aura without status migrainosus, not intractable   2. Vertigo   3. [redacted] weeks gestation of pregnancy     PLAN  Discharge home in stable condition with return precautions.  Start magnesium  supplement daily for migraine prevention. Encouraged adequate fluid and protein intake. Excedrin Tension, Reglan , and Benadryl  as needed for migraines. Benadryl  as needed for vertigo. If vertigo is not migraine-related, may need referral to ENT in the future. Follow up with cardiology and neurology as scheduled.    Follow-up Information     [facility] for [facility] Follow up.   Specialty: Obstetrics and Gynecology Why: As scheduled for ongoing prenatal care Contact information: [address], [address] [place]  [phone]                 Allergies as of 12/20/2023       Reactions   Hydrocodone  Hives   Latex  Itching, Swelling   Milk-related Compounds Dermatitis   Breaks face out *can have 2% milk*   Mushroom Extract Complex (obsolete) Anaphylaxis   Penicillins Anaphylaxis   Has patient had a PCN reaction causing immediate rash, facial/tongue/throat swelling, SOB or lightheadedness with hypotension: Yes Has patient had a PCN reaction causing severe rash involving mucus membranes or skin necrosis: No Has patient had a PCN reaction that required hospitalization No Has patient had a PCN reaction occurring within the last 10 years: Yes If all of the above answers are NO, then may proceed with Cephalosporin use. Has patient had a PCN reaction causing immediate rash, facial/tongue/throat swelling, SOB or lightheadedness with hypotension: Yes Has patient had a PCN reaction causing severe rash involving mucus membranes or skin necrosis: No Has patient had a PCN reaction that required hospitalization No Has patient had a PCN reaction occurring within the last 10 years: Yes If all of the above answers are NO, then may proceed with Cephalosporin use.    Has patient had a PCN reaction causing immediate rash, facial/tongue/throat swelling, SOB or lightheadedness with hypotension: Yes Has patient had a PCN reaction causing severe rash involving mucus membranes or skin necrosis: No Has patient had a PCN reaction that required hospitalization No Has patient had a PCN reaction occurring within the last 10 years: Yes If all of the above answers are NO, then may proceed with Cephalosporin use.   Cheese Other (See Comments)   Breaks face out   Sulfa Antibiotics Swelling        Medication List     TAKE these medications    aspirin  EC 81 MG tablet Take 1 tablet (81 mg total) by mouth daily. Take after 12 weeks for prevention of preeclampsia later in pregnancy   Blood Pressure Kit Devi 1 Device by Does not apply route once a week.   busPIRone  5 MG tablet Commonly known as: BUSPAR  Take 1 tablet (5 mg  total) by mouth 3 (three) times daily.   cetirizine  10 MG tablet Commonly known as: ZYRTEC  Take 1 tablet (10 mg total) by mouth daily.   cyclobenzaprine  10 MG tablet Commonly known as: FLEXERIL  Take 1 tablet (10 mg total) by mouth 2 (two) times daily as needed.   diphenhydrAMINE  25 MG tablet Commonly known as: BENADRYL  Take 1-2 tablets (25-50 mg total) by mouth every 6 (six) hours as needed (for headache or dizziness).   Doxylamine -Pyridoxine  10-10 MG Tbec Commonly  known as: Diclegis  Take 1 tablet by mouth See admin instructions. (Diclegis (R)) Initial, 2 tablets (doxylamine  succinate 10 mg/pyridoxine  hydrochloride 10 mg per tablet) orally at bedtime on day 1 and 2; if symptoms persist, take 1 tablet in morning and 2 tablets at bedtime on day 3; if symptoms persist, may increase to MAX 4 tablets per day, administered as 1 tablet in the morning, 1 tablet in mid-afternoon and 2 tablets at bedtime   EPINEPHrine  0.3 mg/0.3 mL Soaj injection Commonly known as: EpiPen  2-Pak Inject 0.3 mg into the muscle once as needed (for severe allergic reaction). CAll 911 immediately if you have to use this medicine   Excedrin Tension Headache 500-65 MG Tabs per tablet Generic drug: acetaminophen -caffeine  Take 2 tablets by mouth every 6 (six) hours as needed (for headache).   gabapentin  100 MG capsule Commonly known as: NEURONTIN  Take 2 capsules (200 mg total) by mouth at bedtime.   hydrOXYzine  25 MG capsule Commonly known as: VISTARIL  TAKE 1 CAPSULE (25 MG TOTAL) BY MOUTH AT BEDTIME AS NEEDED FOR ANXIETY.   lamoTRIgine  200 MG tablet Commonly known as: LAMICTAL  Take 1 tablet (200 mg total) by mouth 3 (three) times daily.   levETIRAcetam  750 MG tablet Commonly known as: KEPPRA  Take 2 tablets (1,500 mg total) by mouth 2 (two) times daily.   metoCLOPramide  10 MG tablet Commonly known as: Reglan  Take 1 tablet (10 mg total) by mouth every 8 (eight) hours as needed (for nausea or headache). What  changed:  when to take this reasons to take this   ondansetron  4 MG disintegrating tablet Commonly known as: ZOFRAN -ODT Take 1 tablet (4 mg total) by mouth every 8 (eight) hours as needed for refractory nausea / vomiting.   pantoprazole  40 MG tablet Commonly known as: Protonix  Take 1 tablet (40 mg total) by mouth 2 (two) times daily before a meal.   polyethylene glycol powder 17 GM/SCOOP powder Commonly known as: MiraLax  Take 17 g by mouth 2 (two) times daily. Dissolve 1 capful (17g) in 4-8 ounces of liquid and take by mouth daily.   Prenate Mini  18-0.6-0.4-350 MG Caps Take 1 capsule by mouth daily before breakfast.   sertraline  100 MG tablet Commonly known as: ZOLOFT  Take 1 tablet (100 mg total) by mouth daily.   Symbicort  80-4.5 MCG/ACT inhaler Generic drug: budesonide -formoterol  Inhale 2 puffs into the lungs 2 (two) times daily as needed (shortness of breath).   terconazole  0.8 % vaginal cream Commonly known as: TERAZOL 3  Place 1 applicator vaginally at bedtime.   Ventolin  HFA 108 (90 Base) MCG/ACT inhaler Generic drug: albuterol  Inhale 1-2 puffs into the lungs every 6 (six) hours as needed for wheezing or shortness of breath.        Joesph DELENA Sear, PA     [1]  Social History Tobacco Use   Smoking status: Never    Passive exposure: Never   Smokeless tobacco: Never  Vaping Use   Vaping status: Never Used  Substance Use Topics   Alcohol use: Not Currently    Comment: occassional   Drug use: No  [2]  Allergies Allergen Reactions   Hydrocodone  Hives   Latex Itching and Swelling   Milk-Related Compounds Dermatitis    Breaks face out *can have 2% milk*   Mushroom Extract Complex (Obsolete) Anaphylaxis   Penicillins Anaphylaxis    Has patient had a PCN reaction causing immediate rash, facial/tongue/throat swelling, SOB or lightheadedness with hypotension: Yes  Has patient had a PCN reaction causing severe rash involving  mucus membranes or skin necrosis:  No  Has patient had a PCN reaction that required hospitalization No  Has patient had a PCN reaction occurring within the last 10 years: Yes  If all of the above answers are NO, then may proceed with Cephalosporin use.  Has patient had a PCN reaction causing immediate rash, facial/tongue/throat swelling, SOB or lightheadedness with hypotension: Yes Has patient had a PCN reaction causing severe rash involving mucus membranes or skin necrosis: No Has patient had a PCN reaction that required hospitalization No Has patient had a PCN reaction occurring within the last 10 years: Yes If all of the above answers are NO, then may proceed with Cephalosporin use.    Has patient had a PCN reaction causing immediate rash, facial/tongue/throat swelling, SOB or lightheadedness with hypotension: Yes Has patient had a PCN reaction causing severe rash involving mucus membranes or skin necrosis: No Has patient had a PCN reaction that required hospitalization No Has patient had a PCN reaction occurring within the last 10 years: Yes If all of the above answers are NO, then may proceed with Cephalosporin use.   Cheese Other (See Comments)    Breaks face out   Sulfa Antibiotics Swelling

## 2023-12-20 NOTE — Telephone Encounter (Signed)
 Called [facility] , Pt appt Scheduled Jan 5 @ 4

## 2023-12-20 NOTE — MAU Note (Signed)
 Latoya Stark is a 28 y.o. at [redacted]w[redacted]d here in MAU reporting: Has had a headache and dizziness for 4 weeks, this morning went to shower and felt like she was going to pass out and had 2 episodes of vomiting at that time. Took Flexeril  last night but nothing this AM.   LMP: 09/03/2023 Onset of complaint: 4 weeks ago Pain score: 10/10 Headache There were no vitals filed for this visit.   FHT: 133  Lab orders placed from triage: none

## 2023-12-20 NOTE — Progress Notes (Signed)
 Pt presents for ER f/u. Pt is having blurred vision, dizzy and fainting spells with headaches. Pt states that when her eyes are closed she sees a white line. No other questions or concerns at this time.

## 2023-12-20 NOTE — Progress Notes (Signed)
 Subjective:  Latoya Stark is a 28 y.o. [id number] at [redacted]w[redacted]d being seen today for ongoing prenatal care.  She is currently monitored for the following issues for this high-risk pregnancy and has Other constipation; History of seizure; Conversion disorder; BMI 50.0-59.9, adult (HCC); Chronic migraine without aura without status migrainosus, not intractable; Dysphagia; Lipoma of neck; Mild persistent asthma without complication; Anemia; Mild episode of recurrent major depressive disorder; Anxiety; Class 3 severe obesity due to excess calories with serious comorbidity and body mass index (BMI) of 50.0 to 59.9 in adult [facility]); Chronic midline low back pain with bilateral sciatica; Supervision of high risk pregnancy, antepartum; Numbness of hand; and Vertigo on their problem list.  Patient reports dizziness with movement.  Contractions: Not present. Vag. Bleeding: None.  Movement: Present. Denies leaking of fluid.   The following portions of the patient's history were reviewed and updated as appropriate: allergies, current medications, past family history, past medical history, past social history, past surgical history and problem list. Problem list updated.  Objective:   Vitals:   12/20/23 1515  BP: 136/84  Pulse: 95  Weight: (!) 326 lb 1.6 oz (147.9 kg)    Fetal Status: Fetal Heart Rate (bpm): 146   Movement: Present     General:  Alert, oriented and cooperative. Patient is in no acute distress.  Skin: Skin is warm and dry. No rash noted.   Cardiovascular: Normal heart rate noted  Respiratory: Normal respiratory effort, no problems with respiration noted  Abdomen: Soft, gravid, appropriate for gestational age. Pain/Pressure: Present     Pelvic:  Cervical exam deferred        Extremities: Normal range of motion.  Edema: Trace  Mental Status: Normal mood and affect. Normal behavior. Normal judgment and thought content.   Urinalysis:      Assessment and Plan:  Pregnancy: [id number] at [redacted]w[redacted]d  1.  Supervision of high risk pregnancy, antepartum (Primary)  2. History of seizure - taking Keppra  and Lamictal   3. Anxiety - taking Zoloft   4. Mild episode of recurrent major depressive disorder - taking Zoloft   5. Chronic migraine without aura without status migrainosus, not intractable - followed by Neurology  6. Vertigo Rx: - meclizine (ANTIVERT) 25 MG tablet; Take 1 tablet (25 mg total) by mouth 3 (three) times daily as needed for dizziness.  Dispense: 90 tablet; Refill: 5  7. Conversion disorder  8. Obesity affecting pregnancy, antepartum, unspecified obesity type    Preterm labor symptoms and general obstetric precautions including but not limited to vaginal bleeding, contractions, leaking of fluid and fetal movement were reviewed in detail with the patient. Please refer to After Visit Summary for other counseling recommendations.   No follow-ups on file.   Rudy Carlin LABOR, MD

## 2023-12-23 ENCOUNTER — Other Ambulatory Visit: Payer: Self-pay

## 2023-12-23 DIAGNOSIS — O26892 Other specified pregnancy related conditions, second trimester: Secondary | ICD-10-CM | POA: Diagnosis not present

## 2023-12-23 DIAGNOSIS — Z3A15 15 weeks gestation of pregnancy: Secondary | ICD-10-CM

## 2023-12-23 DIAGNOSIS — R1031 Right lower quadrant pain: Secondary | ICD-10-CM | POA: Diagnosis not present

## 2023-12-23 DIAGNOSIS — Z5321 Procedure and treatment not carried out due to patient leaving prior to being seen by health care provider: Secondary | ICD-10-CM | POA: Diagnosis present

## 2023-12-23 DIAGNOSIS — Z3492 Encounter for supervision of normal pregnancy, unspecified, second trimester: Secondary | ICD-10-CM

## 2023-12-23 DIAGNOSIS — Z5329 Procedure and treatment not carried out because of patient's decision for other reasons: Secondary | ICD-10-CM

## 2023-12-23 LAB — WET PREP, GENITAL
Clue Cells Wet Prep HPF POC: NONE SEEN
Sperm: NONE SEEN
Trich, Wet Prep: NONE SEEN
WBC, Wet Prep HPF POC: <10 (ref <10)
Yeast Wet Prep HPF POC: NONE SEEN

## 2023-12-23 LAB — URINALYSIS, ROUTINE W REFLEX MICROSCOPIC
Bilirubin Urine: NEGATIVE
Glucose, UA: NEGATIVE mg/dL
Hgb urine dipstick: NEGATIVE
Ketones, ur: NEGATIVE mg/dL
Leukocytes,Ua: NEGATIVE
Nitrite: NEGATIVE
Protein, ur: NEGATIVE mg/dL
Specific Gravity, Urine: 1.016 (ref 1.005–1.030)
pH: 6 (ref 5.0–8.0)

## 2023-12-23 MED ORDER — ACETAMINOPHEN 500 MG PO TABS: 1000.0000 mg | ORAL_TABLET | Freq: Once | ORAL | Status: DC

## 2023-12-23 NOTE — MAU Note (Signed)
 Latoya Stark is a 28 y.o. at [redacted]w[redacted]d here in MAU reporting: right-sided lower abdominal pain that started Friday night as well as decreased fetal movement since yesterday. Patient states she has been feeling her baby move for several weeks. Was seen in her OB/GYN office during the day on Friday, but states her pain and decreased fetal movement hadn't started at that time.  LMP: 09/03/2023 Onset of complaint: Friday, 12/12 Pain score: 6/10 LRQ Abdominal Pain Vitals:   12/23/23 1152  BP: 114/70  Resp: 16  Temp: (!) 97.4 F (36.3 C)  SpO2: 100%     FHT: 145bpm Lab orders placed from triage: UA

## 2023-12-23 NOTE — MAU Provider Note (Signed)
 None     S [name] is a 28 y.o. [id number] female at [redacted]w[redacted]d who presents to MAU today with complaint of RLQ pain and decreased fetal movement. She reports RLQ pain started 2 days ago after her OB office visit, is currently 6/10. She has not taken pain medication. She reports feeling fetal movement for several weeks now, but less movement since yesterday.  Receives care at [name]. Prenatal records reviewed.  Pertinent items noted in HPI and remainder of comprehensive ROS otherwise negative.   O BP 114/70 (BP Location: Right Arm)   Temp (!) 97.4 F (36.3 C) (Oral)   Resp 16   Ht 5' 5 (1.651 m)   Wt (!) 147.7 kg   LMP 09/03/2023 (Approximate)   SpO2 100%   BMI 54.18 kg/m  Physical Exam Patient left AMA prior to evaluation  MDM: Moderate MAU Course: -Vital signs within normal limits. -UA and wet prep for infection. -After UA and wet prep collection, patient left AMA.  A 1. RLQ abdominal pain (Primary)  2. Fetal heart tones present, second trimester  3. Left against medical advice  4. [redacted] weeks gestation of pregnancy  Medical screening exam complete  P Left AMA prior to evaluation by provider.  Future Appointments  Date Time Provider Department Center  12/28/2023  3:40 PM Tobb, Kardie, DO [facility] None  01/01/2024  3:50 PM Rudy Carlin LABOR, MD [facility] None  01/24/2024 10:00 AM [facility] PROVIDER 1 [facility]  01/24/2024 10:30 AM [facility] US1 [facility]  02/05/2024 11:00 AM Lomax, Amy, NP [facility] None  07/23/2024  9:45 AM [facility] None  07/23/2024 10:20 AM Federico Norleen ONEIDA MADISON, MD [facility] None   Joesph LABOR Sear, PA

## 2023-12-27 ENCOUNTER — Telehealth: Payer: Self-pay

## 2023-12-27 NOTE — Telephone Encounter (Signed)
 Attempted to contact, no answer.  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 Rudy, she should be evaluated by cardiology at appt tomorrow to determine if accommodations for work should be initiated.

## 2023-12-28 ENCOUNTER — Ambulatory Visit: Admitting: Cardiology

## 2023-12-28 ENCOUNTER — Inpatient Hospital Stay (HOSPITAL_COMMUNITY)
Admission: AD | Admit: 2023-12-28 | Discharge: 2023-12-28 | Disposition: A | Discharge status: Home / Self Care | Payer: Self-pay | Attending: Obstetrics and Gynecology | Admitting: Obstetrics and Gynecology

## 2023-12-28 ENCOUNTER — Encounter: Payer: Self-pay | Admitting: Cardiology

## 2023-12-28 ENCOUNTER — Other Ambulatory Visit: Payer: Self-pay

## 2023-12-28 ENCOUNTER — Ambulatory Visit: Attending: Cardiology

## 2023-12-28 VITALS — BP 126/80 | HR 109 | Ht 65.0 in | Wt 325.0 lb

## 2023-12-28 DIAGNOSIS — R0602 Shortness of breath: Secondary | ICD-10-CM | POA: Diagnosis not present

## 2023-12-28 DIAGNOSIS — R42 Dizziness and giddiness: Secondary | ICD-10-CM

## 2023-12-28 DIAGNOSIS — K5909 Other constipation: Secondary | ICD-10-CM | POA: Insufficient documentation

## 2023-12-28 DIAGNOSIS — Z91040 Latex allergy status: Secondary | ICD-10-CM | POA: Insufficient documentation

## 2023-12-28 DIAGNOSIS — O26852 Spotting complicating pregnancy, second trimester: Secondary | ICD-10-CM | POA: Insufficient documentation

## 2023-12-28 DIAGNOSIS — R0789 Other chest pain: Secondary | ICD-10-CM | POA: Diagnosis not present

## 2023-12-28 DIAGNOSIS — R55 Syncope and collapse: Secondary | ICD-10-CM | POA: Diagnosis not present

## 2023-12-28 DIAGNOSIS — R109 Unspecified abdominal pain: Secondary | ICD-10-CM | POA: Diagnosis not present

## 2023-12-28 DIAGNOSIS — Z3A16 16 weeks gestation of pregnancy: Secondary | ICD-10-CM | POA: Diagnosis not present

## 2023-12-28 DIAGNOSIS — O26892 Other specified pregnancy related conditions, second trimester: Secondary | ICD-10-CM | POA: Diagnosis not present

## 2023-12-28 DIAGNOSIS — O26899 Other specified pregnancy related conditions, unspecified trimester: Secondary | ICD-10-CM

## 2023-12-28 LAB — URINALYSIS, ROUTINE W REFLEX MICROSCOPIC
Bilirubin Urine: NEGATIVE
Glucose, UA: NEGATIVE mg/dL
Hgb urine dipstick: NEGATIVE
Ketones, ur: NEGATIVE mg/dL
Leukocytes,Ua: NEGATIVE
Nitrite: NEGATIVE
Protein, ur: NEGATIVE mg/dL
Specific Gravity, Urine: 1.025 (ref 1.005–1.030)
pH: 6.5 (ref 5.0–8.0)

## 2023-12-28 LAB — CBC WITH DIFFERENTIAL/PLATELET
Abs Immature Granulocytes: 0.04 K/uL (ref 0.00–0.07)
Basophils Absolute: 0 K/uL (ref 0.0–0.1)
Basophils Relative: 0 %
Eosinophils Absolute: 0.2 K/uL (ref 0.0–0.5)
Eosinophils Relative: 2 %
HCT: 33.6 % — ABNORMAL LOW (ref 36.0–46.0)
Hemoglobin: 11.5 g/dL — ABNORMAL LOW (ref 12.0–15.0)
Immature Granulocytes: 1 %
Lymphocytes Relative: 25 %
Lymphs Abs: 2.2 K/uL (ref 0.7–4.0)
MCH: 29.6 pg (ref 26.0–34.0)
MCHC: 34.2 g/dL (ref 30.0–36.0)
MCV: 86.6 fL (ref 80.0–100.0)
Monocytes Absolute: 0.6 K/uL (ref 0.1–1.0)
Monocytes Relative: 7 %
Neutro Abs: 5.6 K/uL (ref 1.7–7.7)
Neutrophils Relative %: 65 %
Platelets: 314 K/uL (ref 150–400)
RBC: 3.88 MIL/uL (ref 3.87–5.11)
RDW: 13.4 % (ref 11.5–15.5)
WBC: 8.6 K/uL (ref 4.0–10.5)
nRBC: 0 % (ref 0.0–0.2)

## 2023-12-28 LAB — WET PREP, GENITAL
Clue Cells Wet Prep HPF POC: NONE SEEN
Sperm: NONE SEEN
Trich, Wet Prep: NONE SEEN
WBC, Wet Prep HPF POC: <10
Yeast Wet Prep HPF POC: NONE SEEN

## 2023-12-28 NOTE — Patient Instructions (Addendum)
 Medication Instructions:  Your physician recommends that you continue on your current medications as directed. Please refer to the Current Medication list given to you today.  *If you need a refill on your cardiac medications before your next appointment, please call your pharmacy*   Testing/Procedures: Your physician has requested that you have an echocardiogram-OB. Echocardiography is a painless test that uses sound waves to create images of your heart. It provides your doctor with information about the size and shape of your heart and how well your hearts chambers and valves are working. This procedure takes approximately one hour. There are no restrictions for this procedure. Please do NOT wear cologne, perfume, aftershave, or lotions (deodorant is allowed). Please arrive 15 minutes prior to your appointment time.  Please note: We ask at that you not bring children with you during ultrasound (echo/ vascular) testing. Due to room size and safety concerns, children are not allowed in the ultrasound rooms during exams. Our front office staff cannot provide observation of children in our lobby area while testing is being conducted. An adult accompanying a patient to their appointment will only be allowed in the ultrasound room at the discretion of the ultrasound technician under special circumstances. We apologize for any inconvenience.  ZIO XT- Long Term Monitor Instructions  Your physician has requested you wear a ZIO patch monitor for 14 days.  This is a single patch monitor. [employer] supplies one patch monitor per enrollment. Additional stickers are not available. Please do not apply patch if you will be having a Nuclear Stress Test,  Echocardiogram, Cardiac CT, MRI, or Chest Xray during the period you would be wearing the  monitor. The patch cannot be worn during these tests. You cannot remove and re-apply the  ZIO XT patch monitor.  Your ZIO patch monitor will be mailed 3 day USPS to your  address on file. It may take 3-5 days  to receive your monitor after you have been enrolled.  Once you have received your monitor, please review the enclosed instructions. Your monitor  has already been registered assigning a specific monitor serial # to you.  Billing and Patient Assistance Program Information  We have supplied [employer] with any of your insurance information on file for billing purposes. [employer] offers a sliding scale Patient Assistance Program for patients that do not have  insurance, or whose insurance does not completely cover the cost of the ZIO monitor.  You must apply for the Patient Assistance Program to qualify for this discounted rate.  To apply, please call [employer] at [phone], select option 4, select option 2, ask to apply for  Patient Assistance Program. Meredeth will ask your household income, and how many people  are in your household. They will quote your out-of-pocket cost based on that information.  [employer] will also be able to set up a 74-month, interest-free payment plan if needed.  Applying the monitor   Shave hair from upper left chest.  Hold abrader disc by orange tab. Rub abrader in 40 strokes over the upper left chest as  indicated in your monitor instructions.  Clean area with 4 enclosed alcohol pads. Let dry.  Apply patch as indicated in monitor instructions. Patch will be placed under collarbone on left  side of chest with arrow pointing upward.  Rub patch adhesive wings for 2 minutes. Remove white label marked 1. Remove the white  label marked 2. Rub patch adhesive wings for 2 additional minutes.  While looking in a mirror, press and release  button in center of patch. A small green light will  flash 3-4 times. This will be your only indicator that the monitor has been turned on.  Do not shower for the first 24 hours. You may shower after the first 24 hours.  Press the button if you feel a symptom. You will hear a small click. Record  Date, Time and  Symptom in the Patient Logbook.  When you are ready to remove the patch, follow instructions on the last 2 pages of Patient  Logbook. Stick patch monitor onto the last page of Patient Logbook.  Place Patient Logbook in the blue and white box. Use locking tab on box and tape box closed  securely. The blue and white box has prepaid postage on it. Please place it in the mailbox as  soon as possible. Your physician should have your test results approximately 7 days after the  monitor has been mailed back to [facility].  Call [facility] Customer Care at [phone] if you have questions regarding  your ZIO XT patch monitor. Call them immediately if you see an orange light blinking on your  monitor.  If your monitor falls off in less than 4 days, contact our Monitor department at [phone].  If your monitor becomes loose or falls off after 4 days call [employer] at [phone] for  suggestions on securing your monitor   Follow-Up: At [facility], you and your health needs are our priority.  As part of our continuing mission to provide you with exceptional heart care, our providers are all part of one team.  This team includes your primary Cardiologist (physician) and Advanced Practice Providers or APPs (Physician Assistants and Nurse Practitioners) who all work together to provide you with the care you need, when you need it.  Your next appointment:   8-10 week(s)  Provider:   Kardie Tobb, DO    Other Instructions Impact of Community Health Workers in Improving Maternal Cardiovascular health in the  Postpartum Period  You are being asked to participate in a research study conducted by Kardie Tobb DO, MS, Cornell Finder, CNM, Olu Jegede, MD, from the [facility] at [facility]. The purpose of the study is to provide early interventions to high-risk patients using community health workers and community support groups. These interactions will be via  phone/telehealth visits and in-person community-based support groups. In addition, this study aims to improve postpartum navigation into medical and social care.   You qualify for this study because you are a woman who is receiving prenatal care at [facility] in the third trimester of pregnancy. You may participate in this research study if you are black and have one additional risk factor.  If you decide to participate in this study, the following will occur:  Referral into program and then in-person invitation from [employer] (CHW) in third trimester (or via phone after delivery) ? Ensure patient received standard postpartum hypertension protocols  3-4 day postpartum check in via phone call/virtual visit with CHW - review postpartum health and wellness tools  Invitation to support groups   CHW to touch base weekly until 4-6 weeks then monthly through the first year.   Your data will be collected by the study team. To minimize the risk to your confidentiality this data will be kept on electronic system that is password protected and access is limited to the study team.  The benefit of this study is that you will have a  close follow up on your blood pressure for good control and you will be connected to clinical care in appropriate timing should you have elevated blood pressure.  You can choose whether to be in this study or not. If you volunteer to be in this study, you may withdraw at any time without consequences of any kind. You may also refuse to answer any questions you dont want to answer and still remain in the study. The investigator may withdraw you from this research if circumstances arise which warrant doing so. All personal identifying information are kept in a secure [employer] database available only to the statistician, designer, industrial/product, and Principal Investigator (PI).   I have had this study explained to me and I have been given the opportunity to  ask questions and discuss my concerns. If I have any further questions about the study, I may call the principal investigator, (Dr. Kardie Tobb, [phone]). I understand that I may withdraw from this study at any time without interfering with my medical care.  You are not waiving any legal claims, rights or remedies because of your participation in this research study. If you have questions regarding your rights as a publishing rights manager, contact the [facility] at [phone] ([email or web address]), [place], Institutional Review Board, [address], [place], [place] [address]

## 2023-12-28 NOTE — MAU Note (Signed)
 Latoya Stark is a 28 y.o. at [redacted]w[redacted]d here in MAU reporting: she's having lower abdominal cramping and spotting.  Reports yesterday had light VB.  Denies recent intercourse. Also states not feeling baby move, has begun to feel flutters.  LMP: 09/03/2023 Onset of complaint: 4 days ago Pain score: 10 Vitals:   12/28/23 1820  BP: 118/73  Pulse: 97  Resp: 18  Temp: 98.5 F (36.9 C)  SpO2: 98%     FHT: 147 bpm  Lab orders placed from triage: UA

## 2023-12-28 NOTE — MAU Provider Note (Addendum)
 Chief Complaint:  Abdominal Pain and Vaginal Bleeding   HPI   None     Latoya Stark is a 28 y.o. [id number] at [redacted]w[redacted]d who presents to maternity admissions reporting abdominal cramping and spotting.  She reports abdominal cramping for the past 4 days LLQ>RLQ.  She had spotting yesterday.  Denies recent intercourse.  She also reports that she is not feeling baby move, has begun to feel flutters. She has not taken pain medication. She was in the MAU on 12/23/2023 for the same but left AMA prior to evaluation. She also has a longstanding history of constipation, last bowel movement was 2 days ago. She reports it is not unusual for her to go a month between bowel movements.  Pregnancy Course: Receives care at [facility] for [facility] . Prenatal records reviewed.   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   started at age 39   Headache    Obesity    Pneumonia    Postpartum depression, postpartum condition 08/11/2021   Seizures (HCC)    when I get too hot   Trichomonas infection    Umbilical hernia 04/2022   UTI (lower urinary tract infection)    Vulvar abscess 08/11/2021   OB History  Gravida Para Term Preterm AB Living  9 2 2  6 2   SAB IAB Ectopic Multiple Live Births  4 1  0 2    # Outcome Date GA Lbr Len/2nd Weight Sex Type Anes PTL Lv  9 Current           8 Term 06/22/21 [redacted]w[redacted]d 03:19 / 00:04 3190 g M Vag-Spont EPI  LIV  7 AB 10/08/19 [redacted]w[redacted]d    SAB     6  Term 03/01/19 [redacted]w[redacted]d / 00:07 3455 g M Vag-Spont EPI  LIV  5 SAB 10/10/14          4 SAB           3 IAB           2 SAB           1 SAB            Past Surgical History:  Procedure Laterality Date   COLONOSCOPY N/A 05/22/2023   Procedure: COLONOSCOPY;  Surgeon: Therisa Bi, MD;  Location: [facility] ENDOSCOPY;  Service: Gastroenterology;  Laterality: N/A;   DILATION AND EVACUATION N/A 08/09/2013   Procedure: DILATATION AND EVACUATION;  Surgeon: Aida DELENA Na, MD;  Location: [facility] ORS;  Service: Gynecology;  Laterality: N/A;   DILATION AND EVACUATION  2017   ESOPHAGOGASTRODUODENOSCOPY (EGD) WITH PROPOFOL  N/A 11/10/2022   Procedure: ESOPHAGOGASTRODUODENOSCOPY (EGD) WITH PROPOFOL ;  Surgeon: Therisa Bi, MD;  Location: [facility] ENDOSCOPY;  Service: Gastroenterology;  Laterality: N/A;   EUSTACHIAN TUBE DILATION     EXCISION MASS NECK N/A 11/16/2022   Procedure: EXCISION MASS NECK;  Surgeon: Jordis Laneta FALCON, MD;  Location: [facility]  ORS;  Service: General;  Laterality: N/A;  JP drain placed   LACERATION REPAIR Left 2007   5th toe   ORTHOPEDIC SURGERY Left 2010   hand fracture; screws in place   TONSILLECTOMY AND ADENOIDECTOMY  2001   TYMPANOSTOMY TUBE PLACEMENT Bilateral 2002   UMBILICAL HERNIA REPAIR N/A 05/12/2022   Procedure: LAPAROSCOPIC ASSISTED SUPRA UMBILICAL HERNIA REPAIR;  Surgeon: Gladis Cough, MD;  Location: [facility] ORS;  Service: General;  Laterality: N/A;   Family History  Problem Relation Age of Onset   Heart disease Mother    Depression Mother    Cancer Mother        Bone cancer   Seizures Mother    Stroke Mother    Sickle cell anemia Mother    Hyperlipidemia Father    Heart disease Father    Hypertension Father    Sleep apnea Father    Seizures Maternal Aunt    Diabetes Maternal Grandmother    Colon cancer Maternal Grandmother    Hypertension Maternal Grandmother    Clotting disorder Maternal Grandmother    Breast cancer Maternal Grandmother    Stroke Maternal Grandmother     Diabetes Paternal Grandmother    Social History[1] Allergies[2] Medications Prior to Admission  Medication Sig Dispense Refill Last Dose/Taking   albuterol  (VENTOLIN  HFA) 108 (90 Base) MCG/ACT inhaler Inhale 1-2 puffs into the lungs every 6 (six) hours as needed for wheezing or shortness of breath. 18 g 11 Past Month   aspirin  EC 81 MG tablet Take 1 tablet (81 mg total) by mouth daily. Take after 12 weeks for prevention of preeclampsia later in pregnancy 300 tablet 2 12/28/2023   busPIRone  (BUSPAR ) 5 MG tablet Take 1 tablet (5 mg total) by mouth 3 (three) times daily. 90 tablet 0 12/28/2023   cetirizine  (ZYRTEC ) 10 MG tablet Take 1 tablet (10 mg total) by mouth daily. 30 tablet 0 12/28/2023   cyclobenzaprine  (FLEXERIL ) 10 MG tablet Take 1 tablet (10 mg total) by mouth 2 (two) times daily as needed. 30 tablet 0 12/27/2023   Doxylamine -Pyridoxine  (DICLEGIS ) 10-10 MG TBEC Take 1 tablet by mouth See admin instructions. (Diclegis (R)) Initial, 2 tablets (doxylamine  succinate 10 mg/pyridoxine  hydrochloride 10 mg per tablet) orally at bedtime on day 1 and 2; if symptoms persist, take 1 tablet in morning and 2 tablets at bedtime on day 3; if symptoms persist, may increase to MAX 4 tablets per day, administered as 1 tablet in the morning, 1 tablet in mid-afternoon and 2 tablets at bedtime 60 tablet 0 12/28/2023   gabapentin  (NEURONTIN ) 100 MG capsule Take 2 capsules (200 mg total) by mouth at bedtime. 180 capsule 0 12/27/2023   hydrOXYzine  (VISTARIL ) 25 MG capsule TAKE 1 CAPSULE (25 MG TOTAL) BY MOUTH AT BEDTIME AS NEEDED FOR ANXIETY. 90 capsule 0 12/27/2023   lamoTRIgine  (LAMICTAL ) 200 MG tablet Take 1 tablet (200 mg total) by mouth 3 (three) times daily. 90 tablet 0 12/28/2023   levETIRAcetam  (KEPPRA ) 750 MG tablet Take 2 tablets (1,500 mg total) by mouth 2 (two) times daily. 120 tablet 0 12/28/2023   meclizine  (ANTIVERT ) 25 MG tablet Take 1 tablet (25 mg total) by mouth 3 (three) times daily as needed for  dizziness. 90 tablet 5 12/28/2023   pantoprazole  (PROTONIX ) 40 MG tablet Take 1 tablet (40 mg total) by mouth 2 (two) times daily before a meal. 60 tablet 5 12/28/2023   Prenat-FeCbn-FeAsp-Meth-FA-DHA (PRENATE MINI ) 18-0.6-0.4-350 MG CAPS Take 1 capsule by mouth daily before breakfast. 90 capsule 3  12/28/2023   sertraline  (ZOLOFT ) 100 MG tablet Take 1 tablet (100 mg total) by mouth daily. 90 tablet 1 12/28/2023   SYMBICORT  80-4.5 MCG/ACT inhaler Inhale 2 puffs into the lungs 2 (two) times daily as needed (shortness of breath). 10.2 g 10 Past Month   acetaminophen -caffeine  (EXCEDRIN  TENSION HEADACHE) 500-65 MG TABS per tablet Take 2 tablets by mouth every 6 (six) hours as needed (for headache). 60 tablet 2 12/26/2023   Blood Pressure Monitoring (BLOOD PRESSURE KIT) DEVI 1 Device by Does not apply route once a week. 1 each 0    diphenhydrAMINE  (BENADRYL ) 25 MG tablet Take 1-2 tablets (25-50 mg total) by mouth every 6 (six) hours as needed (for headache or dizziness). 60 tablet 2 12/25/2023   EPINEPHrine  (EPIPEN  2-PAK) 0.3 mg/0.3 mL IJ SOAJ injection Inject 0.3 mg into the muscle once as needed (for severe allergic reaction). CAll 911 immediately if you have to use this medicine 2 each 1    metoCLOPramide  (REGLAN ) 10 MG tablet Take 1 tablet (10 mg total) by mouth every 8 (eight) hours as needed (for nausea or headache). 90 tablet 0 12/26/2023   ondansetron  (ZOFRAN -ODT) 4 MG disintegrating tablet Take 1 tablet (4 mg total) by mouth every 8 (eight) hours as needed for refractory nausea / vomiting. 20 tablet 0 12/26/2023   polyethylene glycol powder (MIRALAX ) 17 GM/SCOOP powder Take 17 g by mouth 2 (two) times daily. Dissolve 1 capful (17g) in 4-8 ounces of liquid and take by mouth daily. 238 g 0 More than a month   terconazole  (TERAZOL 3 ) 0.8 % vaginal cream Place 1 applicator vaginally at bedtime. 20 g 0     I have reviewed patient's Past Medical Hx, Surgical Hx, Family Hx, Social Hx, medications and  allergies.   ROS  Pertinent items noted in HPI and remainder of comprehensive ROS otherwise negative.   PHYSICAL EXAM  Patient Vitals for the past 24 hrs:  BP Temp Temp src Pulse Resp SpO2 Height Weight  12/28/23 1820 118/73 98.5 F (36.9 C) Oral 97 18 98 % -- --  12/28/23 1812 -- -- -- -- -- -- 5' 5 (1.651 m) (!) 148.4 kg  FHT: 147  Constitutional: Well-developed, well-nourished female in no acute distress.  HEENT: atraumatic, normocephalic. Neck has normal ROM. EOM intact. Cardiovascular: normal rate & rhythm, warm and well-perfused. Mild LLQ tenderness. No rebound or guarding. Respiratory: normal effort, no problems with respiration noted GI: Abd soft, non-distended MSK: Extremities nontender, no edema, normal ROM Skin: warm and dry. Acyanotic, no jaundice or pallor. Neurologic: Alert and oriented x 4. No abnormal coordination. Psychiatric: Normal mood. Speech not slurred, not rapid/pressured. Patient is cooperative.  Labs: Results for orders placed or performed during the hospital encounter of 12/28/23 (from the past 24 hours)  Urinalysis, Routine w reflex microscopic -Urine, Clean Catch     Status: None   Collection Time: 12/28/23  6:35 PM  Result Value Ref Range   Color, Urine YELLOW YELLOW   APPearance CLEAR CLEAR   Specific Gravity, Urine 1.025 1.005 - 1.030   pH 6.5 5.0 - 8.0   Glucose, UA NEGATIVE NEGATIVE mg/dL   Hgb urine dipstick NEGATIVE NEGATIVE   Bilirubin Urine NEGATIVE NEGATIVE   Ketones, ur NEGATIVE NEGATIVE mg/dL   Protein, ur NEGATIVE NEGATIVE mg/dL   Nitrite NEGATIVE NEGATIVE   Leukocytes,Ua NEGATIVE NEGATIVE  Wet prep, genital     Status: None   Collection Time: 12/28/23  6:35 PM   Specimen: Urine, Clean Catch  Result Value Ref Range   Yeast Wet Prep HPF POC NONE SEEN NONE SEEN   Trich, Wet Prep NONE SEEN NONE SEEN   Clue Cells Wet Prep HPF POC NONE SEEN NONE SEEN   WBC, Wet Prep HPF POC <10 <10   Sperm NONE SEEN   CBC with Differential/Platelet      Status: Abnormal   Collection Time: 12/28/23  8:26 PM  Result Value Ref Range   WBC 8.6 4.0 - 10.5 K/uL   RBC 3.88 3.87 - 5.11 MIL/uL   Hemoglobin 11.5 (L) 12.0 - 15.0 g/dL   HCT 66.3 (L) 63.9 - 53.9 %   MCV 86.6 80.0 - 100.0 fL   MCH 29.6 26.0 - 34.0 pg   MCHC 34.2 30.0 - 36.0 g/dL   RDW 86.5 88.4 - 84.4 %   Platelets 314 150 - 400 K/uL   nRBC 0.0 0.0 - 0.2 %   Neutrophils Relative % 65 %   Neutro Abs 5.6 1.7 - 7.7 K/uL   Lymphocytes Relative 25 %   Lymphs Abs 2.2 0.7 - 4.0 K/uL   Monocytes Relative 7 %   Monocytes Absolute 0.6 0.1 - 1.0 K/uL   Eosinophils Relative 2 %   Eosinophils Absolute 0.2 0.0 - 0.5 K/uL   Basophils Relative 0 %   Basophils Absolute 0.0 0.0 - 0.1 K/uL   Immature Granulocytes 1 %   Abs Immature Granulocytes 0.04 0.00 - 0.07 K/uL   *Note: Due to a large number of results and/or encounters for the requested time period, some results have not been displayed. A complete set of results can be found in Results Review.    Imaging:  No results found.  MDM & MAU COURSE  MDM: Moderate  MAU Course: -Vital signs within normal limits. FHT appropriate and reassuring of fetal well-being. -UA and wet prep to rule out infection.  -CBC to rule out systemic infection. -UA and wet prep negative. -CBC with anemia but no elevated WBC. -Fetal movement seen on bedside US  for additional maternal reassurance.  Differential diagnosis considered for lower abdominal pain includes but is not limited to: round ligament pain, ectopic pregnancy, torsion, UTI, pyelonephritis, PID, cervicitis, chorioamnionitis, appendicitis, diverticulitis, constipation, nephrolithiasis  Orders Placed This Encounter  Procedures   Wet prep, genital   Urinalysis, Routine w reflex microscopic -Urine, Clean Catch   CBC with Differential/Platelet   Discharge patient   No orders of the defined types were placed in this encounter.   ASSESSMENT   1. Abdominal cramping affecting pregnancy    2. Other constipation   3. [redacted] weeks gestation of pregnancy     PLAN  Discharge home in stable condition with return precautions.  Discussed that fetal movement varies until 28 weeks. Starting at 28 weeks, recommend fetal movement counts if concerned.  Recommend increased hydration, stretching, Tylenol  prn for abdominal pain. Recommend MiraLax  daily to maintain regular, soft bowel movements.    Allergies as of 12/28/2023       Reactions   Hydrocodone  Hives   Latex Itching, Swelling   Milk-related Compounds Dermatitis   Breaks face out *can have 2% milk*   Mushroom Extract Complex (obsolete) Anaphylaxis   Penicillins Anaphylaxis   Has patient had a PCN reaction causing immediate rash, facial/tongue/throat swelling, SOB or lightheadedness with hypotension: Yes Has patient had a PCN reaction causing severe rash involving mucus membranes or skin necrosis: No Has patient had a PCN reaction that required hospitalization No Has patient had a PCN reaction occurring  within the last 10 years: Yes If all of the above answers are NO, then may proceed with Cephalosporin use. Has patient had a PCN reaction causing immediate rash, facial/tongue/throat swelling, SOB or lightheadedness with hypotension: Yes Has patient had a PCN reaction causing severe rash involving mucus membranes or skin necrosis: No Has patient had a PCN reaction that required hospitalization No Has patient had a PCN reaction occurring within the last 10 years: Yes If all of the above answers are NO, then may proceed with Cephalosporin use.    Has patient had a PCN reaction causing immediate rash, facial/tongue/throat swelling, SOB or lightheadedness with hypotension: Yes Has patient had a PCN reaction causing severe rash involving mucus membranes or skin necrosis: No Has patient had a PCN reaction that required hospitalization No Has patient had a PCN reaction occurring within the last 10 years: Yes If all of the above answers  are NO, then may proceed with Cephalosporin use.   Cheese Other (See Comments)   Breaks face out   Sulfa Antibiotics Swelling        Medication List     TAKE these medications    aspirin  EC 81 MG tablet Take 1 tablet (81 mg total) by mouth daily. Take after 12 weeks for prevention of preeclampsia later in pregnancy   Blood Pressure Kit Devi 1 Device by Does not apply route once a week.   busPIRone  5 MG tablet Commonly known as: BUSPAR  Take 1 tablet (5 mg total) by mouth 3 (three) times daily.   cetirizine  10 MG tablet Commonly known as: ZYRTEC  Take 1 tablet (10 mg total) by mouth daily.   cyclobenzaprine  10 MG tablet Commonly known as: FLEXERIL  Take 1 tablet (10 mg total) by mouth 2 (two) times daily as needed.   diphenhydrAMINE  25 MG tablet Commonly known as: BENADRYL  Take 1-2 tablets (25-50 mg total) by mouth every 6 (six) hours as needed (for headache or dizziness).   Doxylamine -Pyridoxine  10-10 MG Tbec Commonly known as: Diclegis  Take 1 tablet by mouth See admin instructions. (Diclegis (R)) Initial, 2 tablets (doxylamine  succinate 10 mg/pyridoxine  hydrochloride 10 mg per tablet) orally at bedtime on day 1 and 2; if symptoms persist, take 1 tablet in morning and 2 tablets at bedtime on day 3; if symptoms persist, may increase to MAX 4 tablets per day, administered as 1 tablet in the morning, 1 tablet in mid-afternoon and 2 tablets at bedtime   EPINEPHrine  0.3 mg/0.3 mL Soaj injection Commonly known as: EpiPen  2-Pak Inject 0.3 mg into the muscle once as needed (for severe allergic reaction). CAll 911 immediately if you have to use this medicine   Excedrin  Tension Headache 500-65 MG Tabs per tablet Generic drug: acetaminophen -caffeine  Take 2 tablets by mouth every 6 (six) hours as needed (for headache).   gabapentin  100 MG capsule Commonly known as: NEURONTIN  Take 2 capsules (200 mg total) by mouth at bedtime.   hydrOXYzine  25 MG capsule Commonly known as:  VISTARIL  TAKE 1 CAPSULE (25 MG TOTAL) BY MOUTH AT BEDTIME AS NEEDED FOR ANXIETY.   lamoTRIgine  200 MG tablet Commonly known as: LAMICTAL  Take 1 tablet (200 mg total) by mouth 3 (three) times daily.   levETIRAcetam  750 MG tablet Commonly known as: KEPPRA  Take 2 tablets (1,500 mg total) by mouth 2 (two) times daily.   meclizine  25 MG tablet Commonly known as: ANTIVERT  Take 1 tablet (25 mg total) by mouth 3 (three) times daily as needed for dizziness.   metoCLOPramide  10 MG tablet Commonly known  as: Reglan  Take 1 tablet (10 mg total) by mouth every 8 (eight) hours as needed (for nausea or headache).   ondansetron  4 MG disintegrating tablet Commonly known as: ZOFRAN -ODT Take 1 tablet (4 mg total) by mouth every 8 (eight) hours as needed for refractory nausea / vomiting.   pantoprazole  40 MG tablet Commonly known as: Protonix  Take 1 tablet (40 mg total) by mouth 2 (two) times daily before a meal.   polyethylene glycol powder 17 GM/SCOOP powder Commonly known as: MiraLax  Take 17 g by mouth 2 (two) times daily. Dissolve 1 capful (17g) in 4-8 ounces of liquid and take by mouth daily.   Prenate Mini  18-0.6-0.4-350 MG Caps Take 1 capsule by mouth daily before breakfast.   sertraline  100 MG tablet Commonly known as: ZOLOFT  Take 1 tablet (100 mg total) by mouth daily.   Symbicort  80-4.5 MCG/ACT inhaler Generic drug: budesonide -formoterol  Inhale 2 puffs into the lungs 2 (two) times daily as needed (shortness of breath).   terconazole  0.8 % vaginal cream Commonly known as: TERAZOL 3  Place 1 applicator vaginally at bedtime.   Ventolin  HFA 108 (90 Base) MCG/ACT inhaler Generic drug: albuterol  Inhale 1-2 puffs into the lungs every 6 (six) hours as needed for wheezing or shortness of breath.        Joesph DELENA Sear, PA      [1]  Social History Tobacco Use   Smoking status: Never    Passive exposure: Never   Smokeless tobacco: Never  Vaping Use   Vaping status: Never  Used  Substance Use Topics   Alcohol use: Not Currently    Comment: occassional   Drug use: No  [2]  Allergies Allergen Reactions   Hydrocodone  Hives   Latex Itching and Swelling   Milk-Related Compounds Dermatitis    Breaks face out *can have 2% milk*   Mushroom Extract Complex (Obsolete) Anaphylaxis   Penicillins Anaphylaxis    Has patient had a PCN reaction causing immediate rash, facial/tongue/throat swelling, SOB or lightheadedness with hypotension: Yes  Has patient had a PCN reaction causing severe rash involving mucus membranes or skin necrosis: No  Has patient had a PCN reaction that required hospitalization No  Has patient had a PCN reaction occurring within the last 10 years: Yes  If all of the above answers are NO, then may proceed with Cephalosporin use.  Has patient had a PCN reaction causing immediate rash, facial/tongue/throat swelling, SOB or lightheadedness with hypotension: Yes Has patient had a PCN reaction causing severe rash involving mucus membranes or skin necrosis: No Has patient had a PCN reaction that required hospitalization No Has patient had a PCN reaction occurring within the last 10 years: Yes If all of the above answers are NO, then may proceed with Cephalosporin use.    Has patient had a PCN reaction causing immediate rash, facial/tongue/throat swelling, SOB or lightheadedness with hypotension: Yes Has patient had a PCN reaction causing severe rash involving mucus membranes or skin necrosis: No Has patient had a PCN reaction that required hospitalization No Has patient had a PCN reaction occurring within the last 10 years: Yes If all of the above answers are NO, then may proceed with Cephalosporin use.   Cheese Other (See Comments)    Breaks face out   Sulfa Antibiotics Swelling

## 2023-12-28 NOTE — Discharge Instructions (Signed)
 PELVIC/ABDOMINAL PAIN: Up to 80 percent of women experience lower abdomen or groin pain at some point during pregnancy. As baby grows, there is an ever-increasing stress on the joints, muscles and organs in your pelvis and back. For abdominal pain: Soak in a warm tub (not too hot) Tylenol  1000 mg by mouth every 6-8 hrs as needed for pain Slow position changes Laying on the affected side Wear a maternity support belt during the day. Take it off while sleeping. Apply a heating pad to your lower back for 20 minutes at a time, taking at least a 20-minute break before applying again. Massage: Starting at the middle of your pubic bone, trace little circles in a wide U from your pubic bone to your hip bones on both sides.  Then starting just above your pubic bone, press in and down, alternating sides to create a gentle rocking of your uterus back and forth.  Move your hands up the sides of your belly and back down. Do this 3-5 times upon waking and before bed.  Stretches: Get on hands and knees and alternate arching your back deeply while inhaling, and then rounding your back while exhaling. Modified runners lunge:  Sit on a chair with half of your bottom on the chair and half off.  Sit up tall, plant your front foot, and stretch your other foot out behind you.  Breathe deeply for 5 breaths and then do the other side. Chiropractor or massage therapy: [facility] in [place] specializes in pregnancy care. You can visit their website at: [email or web address] to schedule a new patient chiropractic treatment (1 hour) appointment. Please let us  know if you have any other questions or concerns.   FETAL MOVEMENT: Most people start feeling fetal movement between 16 and 20 weeks of pregnancy. If you have been pregnant before, you will likely start feeling movement earlier in pregnancy.  Fetal movement varies a lot during the first two trimesters of pregnancy as babies develop.  We do not need to worry about variations in how much movement you can feel until about 25 weeks of pregnancy. Until that point, babies are still figuring a lot out and tend to have fluctuations in their sleep-wake patterns. Starting at 28 weeks, you may be asked to start fetal movement (kick) counts periodically.  Reasons to return to MAU at [facility]: Starting at 28 weeks: You don't feel your baby moving like normal.  If you think that you babys movement is decreased, eat a snack and rest on your left side in a quiet room for one hour. If you have not felt the baby move more than 6 times in an hour GO TO THE HOSPITAL.    Less than 36 weeks: Contractions feels like menstrual cramps. You should go to the hospital if you have more than 6 contractions in an hour, even after you have rested and drank at least 16 ounces of water .  More than 36 weeks: You begin to have strong, frequent contractions 5 minutes apart or less, each last 1 minute, these have been going on for 1-2 hours, and you cannot walk or talk during them. Your water  breaks.  Sometimes it is a big gush of fluid. However, many times it may it may be much more subtle. You should go to the hospital if you have a constant leakage of fluid from your vagina, enough to soak a pad when you are walking around.  You have vaginal bleeding.  It is normal  to have a small amount of spotting if your cervix was checked. If you have bleeding requiring the use of a pad, go to the hospital.

## 2023-12-28 NOTE — Progress Notes (Unsigned)
 Enrolled patient for a 14 day Zio XT  monitor to be mailed to patients home

## 2023-12-30 NOTE — Progress Notes (Unsigned)
 "  Cardio-Obstetrics Clinic  New Evaluation  Date:  12/31/2023   ID:  Latoya Stark, DOB 1995/10/31, MRN [id number]  PCP:  Gareth Mliss FALCON, FNP   [facility] Providers Cardiologist:  Dub Huntsman, DO  Electrophysiologist:  None       Referring MD: Alger Gong, MD   Chief Complaint:  I am having some dizziness  History of Present Illness:    Latoya Stark is a 28 y.o. female [[id number]] who is being seen today for the evaluation of dizziness at the request of Constant, Peggy, MD.   Medical hx includes epilepsy, chronic hypertension and chronic migraines who presents with dizziness and blurry vision during pregnancy.  She is [redacted] weeks pregnant and has frequent dizziness and blurry vision. She also has intermittent chest pain that she feels is less severe than in the past.   Her family history is notable for a grandmother who required a coronary stent and relatives with slow heart function and congestive heart failure.  She reports occasional shortness of breath and leg swelling. After her last delivery she had episodes of low heart rate and low blood sugar that took time to stabilize.  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 age 20   Headache    Obesity    Pneumonia    Postpartum depression, postpartum condition 08/11/2021   Seizures (HCC)    when  I get too hot   Trichomonas infection    Umbilical hernia 04/2022   UTI (lower urinary tract infection)    Vulvar abscess 08/11/2021    Past Surgical History:  Procedure Laterality Date   COLONOSCOPY N/A 05/22/2023   Procedure: COLONOSCOPY;  Surgeon: Therisa Bi, MD;  Location: [facility] ENDOSCOPY;  Service: Gastroenterology;  Laterality: N/A;   DILATION AND EVACUATION N/A 08/09/2013   Procedure: DILATATION AND EVACUATION;  Surgeon: Aida DELENA Na, MD;  Location: [facility] ORS;  Service: Gynecology;  Laterality: N/A;   DILATION AND EVACUATION  2017   ESOPHAGOGASTRODUODENOSCOPY (EGD) WITH PROPOFOL  N/A 11/10/2022   Procedure: ESOPHAGOGASTRODUODENOSCOPY (EGD) WITH PROPOFOL ;  Surgeon: Therisa Bi, MD;  Location: [facility] ENDOSCOPY;  Service: Gastroenterology;  Laterality: N/A;   EUSTACHIAN TUBE DILATION     EXCISION MASS NECK N/A 11/16/2022   Procedure: EXCISION MASS NECK;  Surgeon: Jordis Laneta FALCON, MD;  Location: [facility] ORS;  Service: General;  Laterality: N/A;  JP drain placed   LACERATION REPAIR Left 2007   5th toe   ORTHOPEDIC SURGERY Left 2010   hand fracture; screws in place   TONSILLECTOMY AND ADENOIDECTOMY  2001   TYMPANOSTOMY TUBE PLACEMENT Bilateral 2002   UMBILICAL HERNIA REPAIR N/A 05/12/2022   Procedure: LAPAROSCOPIC ASSISTED SUPRA UMBILICAL HERNIA REPAIR;  Surgeon: Gladis Cough, MD;  Location: [facility] ORS;  Service: General;  Laterality: N/A;  OB History     Gravida  9   Para  2   Term  2   Preterm      AB  6   Living  2      SAB  4   IAB  1   Ectopic      Multiple  0   Live Births  2               Current Medications: Active Medications[1]   Allergies:   Hydrocodone , Latex, Milk-related compounds, Mushroom extract complex (obsolete), Penicillins, Cheese, and Sulfa antibiotics   Social History   Socioeconomic History   Marital status: Single    Spouse name: Jermaine   Number of children: 2   Years of education: Not on file   Highest education  level: Associate degree: academic program  Occupational History   Not on file  Tobacco Use   Smoking status: Never    Passive exposure: Never   Smokeless tobacco: Never  Vaping Use   Vaping status: Never Used  Substance and Sexual Activity   Alcohol use: Not Currently    Comment: occassional   Drug use: No   Sexual activity: Not Currently    Partners: Male  Other Topics Concern   Not on file  Social History Narrative   Caffeine : tea sometimes   Lives with fiance   Right handed    Social Drivers of Health   Tobacco Use: Low Risk (12/28/2023)   Patient History    Smoking Tobacco Use: Never    Smokeless Tobacco Use: Never    Passive Exposure: Never  Financial Resource Strain: Low Risk (11/24/2022)   Overall Financial Resource Strain (CARDIA)    Difficulty of Paying Living Expenses: Not hard at all  Food Insecurity: No Food Insecurity (11/24/2022)   Hunger Vital Sign    Worried About Running Out of Food in the Last Year: Never true    Ran Out of Food in the Last Year: Never true  Transportation Needs: No Transportation Needs (11/24/2022)   PRAPARE - Administrator, Civil Service (Medical): No    Lack of Transportation (Non-Medical): No  Physical Activity: Insufficiently Active (11/24/2022)   Exercise Vital Sign    Days of Exercise per Week: 5 days    Minutes of Exercise per Session: 20 min  Stress: No Stress Concern Present (11/24/2022)   [employer] of Occupational Health - Occupational Stress Questionnaire    Feeling of Stress : Not at all  Social Connections: Unknown (11/24/2022)   Social Connection and Isolation Panel    Frequency of Communication with Friends and Family: Patient declined    Frequency of Social Gatherings with Friends and Family: Patient declined    Attends Religious Services: Patient declined    Active Member of Clubs or Organizations: No    Attends Engineer, Structural: Not on file    Marital Status: Never married   Depression (PHQ2-9): Low Risk (11/05/2023)   Depression (PHQ2-9)    PHQ-2 Score: 3  Recent Concern: Depression (PHQ2-9) - Medium Risk (08/23/2023)   Depression (PHQ2-9)    PHQ-2 Score: 7  Alcohol Screen: Low Risk (11/24/2022)   Alcohol Screen    Last Alcohol Screening Score (AUDIT): 0  Housing: Low Risk (11/24/2022)   Housing    Last Housing Risk Score: 0  Utilities: Not on file  Health Literacy: Not on file      Family History  Problem Relation Age of Onset  Heart disease Mother    Depression Mother    Cancer Mother        Bone cancer   Seizures Mother    Stroke Mother    Sickle cell anemia Mother    Hyperlipidemia Father    Heart disease Father    Hypertension Father    Sleep apnea Father    Seizures Maternal Aunt    Diabetes Maternal Grandmother    Colon cancer Maternal Grandmother    Hypertension Maternal Grandmother    Clotting disorder Maternal Grandmother    Breast cancer Maternal Grandmother    Stroke Maternal Grandmother    Diabetes Paternal Grandmother       ROS:   Please see the history of present illness.    Shortness of breath All other systems reviewed and are negative.   Labs/EKG Reviewed:    EKG:  EKG was not ordered today.   Recent Labs: 09/30/2023: ALT 18 12/20/2023: BUN 5; Creatinine, Ser 0.53; Magnesium  1.7; Potassium 3.6; Sodium 134; TSH 1.765 12/28/2023: Hemoglobin 11.5; Platelets 314   Recent Lipid Panel Lab Results  Component Value Date/Time   CHOL 163 04/19/2023 10:47 AM   TRIG 70 04/19/2023 10:47 AM   HDL 49 04/19/2023 10:47 AM   CHOLHDL 3.3 04/19/2023 10:47 AM   CHOLHDL 4.1 12/13/2006 06:30 AM   LDLCALC 100 (H) 04/19/2023 10:47 AM    Physical Exam:    VS:  BP 126/80 (BP Location: Left Arm, Patient Position: Sitting, Cuff Size: Large)   Pulse (!) 109   Ht 5' 5 (1.651 m)   Wt (!) 325 lb (147.4 kg)   LMP 09/03/2023 (Approximate)   SpO2 95%   BMI 54.08 kg/m     Wt Readings from Last 3 Encounters:  12/28/23 (!) 327  lb 1.6 oz (148.4 kg)  12/28/23 (!) 325 lb (147.4 kg)  12/23/23 (!) 325 lb 9.6 oz (147.7 kg)     GEN:  Well nourished, well developed in no acute distress HEENT: Normal NECK: No JVD; No carotid bruits LYMPHATICS: No lymphadenopathy CARDIAC: RRR, no murmurs, rubs, gallops RESPIRATORY:  Clear to auscultation without rales, wheezing or rhonchi  ABDOMEN: Soft, non-tender, non-distended MUSCULOSKELETAL:  No edema; No deformity  SKIN: Warm and dry NEUROLOGIC:  Alert and oriented x 3 PSYCHIATRIC:  Normal affect    Risk Assessment/Risk Calculators:     CARPREG II Risk Prediction Index Score:  1.  The patient's risk for a primary cardiac event is 5%.   Modified World Health Organization [facility]) Classification of Maternal CV Risk   Class I         ASSESSMENT & PLAN:    Chronic hypertension in pregnancy Palpitations Family history of cardiovascular issues. Reports of dizziness, blurry vision, heartburn, intermittent chest pain, and tachycardia. Hypertension in previous pregnancies. Currently on aspirin  for preeclampsia prophylaxis. - Ordered 14-day home heart monitor to assess arrhythmias. - Ordered echocardiogram to evaluate cardiac function. - Instructed to take daily blood pressure readings and report. - Scheduled follow-up in 8-10 weeks.  - Pt meets eligibility for IMPACT study, discussed risks/benefits of the study  - Provided with information sheet   - Aware that the research team will be in contact within 24-48hrs, followed by the Lead CHW    Patient Instructions  Medication Instructions:  Your physician recommends that you continue on your current medications as directed. Please refer to the Current Medication list given to you today.  *If you need a refill on your cardiac medications before your next appointment,  please call your pharmacy*   Testing/Procedures: Your physician has requested that you have an echocardiogram-OB. Echocardiography is a painless test that uses  sound waves to create images of your heart. It provides your doctor with information about the size and shape of your heart and how well your hearts chambers and valves are working. This procedure takes approximately one hour. There are no restrictions for this procedure. Please do NOT wear cologne, perfume, aftershave, or lotions (deodorant is allowed). Please arrive 15 minutes prior to your appointment time.  Please note: We ask at that you not bring children with you during ultrasound (echo/ vascular) testing. Due to room size and safety concerns, children are not allowed in the ultrasound rooms during exams. Our front office staff cannot provide observation of children in our lobby area while testing is being conducted. An adult accompanying a patient to their appointment will only be allowed in the ultrasound room at the discretion of the ultrasound technician under special circumstances. We apologize for any inconvenience.  ZIO XT- Long Term Monitor Instructions  Your physician has requested you wear a ZIO patch monitor for 14 days.  This is a single patch monitor. [employer] supplies one patch monitor per enrollment. Additional stickers are not available. Please do not apply patch if you will be having a Nuclear Stress Test,  Echocardiogram, Cardiac CT, MRI, or Chest Xray during the period you would be wearing the  monitor. The patch cannot be worn during these tests. You cannot remove and re-apply the  ZIO XT patch monitor.  Your ZIO patch monitor will be mailed 3 day USPS to your address on file. It may take 3-5 days  to receive your monitor after you have been enrolled.  Once you have received your monitor, please review the enclosed instructions. Your monitor  has already been registered assigning a specific monitor serial # to you.  Billing and Patient Assistance Program Information  We have supplied [employer] with any of your insurance information on file for billing purposes. [employer]  offers a sliding scale Patient Assistance Program for patients that do not have  insurance, or whose insurance does not completely cover the cost of the ZIO monitor.  You must apply for the Patient Assistance Program to qualify for this discounted rate.  To apply, please call [employer] at [phone], select option 4, select option 2, ask to apply for  Patient Assistance Program. Meredeth will ask your household income, and how many people  are in your household. They will quote your out-of-pocket cost based on that information.  [employer] will also be able to set up a 24-month, interest-free payment plan if needed.  Applying the monitor   Shave hair from upper left chest.  Hold abrader disc by orange tab. Rub abrader in 40 strokes over the upper left chest as  indicated in your monitor instructions.  Clean area with 4 enclosed alcohol pads. Let dry.  Apply patch as indicated in monitor instructions. Patch will be placed under collarbone on left  side of chest with arrow pointing upward.  Rub patch adhesive wings for 2 minutes. Remove white label marked 1. Remove the white  label marked 2. Rub patch adhesive wings for 2 additional minutes.  While looking in a mirror, press and release button in center of patch. A small green light will  flash 3-4 times. This will be your only indicator that the monitor has been turned on.  Do not shower for the first 24 hours. You may shower  after the first 24 hours.  Press the button if you feel a symptom. You will hear a small click. Record Date, Time and  Symptom in the Patient Logbook.  When you are ready to remove the patch, follow instructions on the last 2 pages of Patient  Logbook. Stick patch monitor onto the last page of Patient Logbook.  Place Patient Logbook in the blue and white box. Use locking tab on box and tape box closed  securely. The blue and white box has prepaid postage on it. Please place it in the mailbox as  soon as possible. Your  physician should have your test results approximately 7 days after the  monitor has been mailed back to [facility].  Call [facility] Customer Care at [phone] if you have questions regarding  your ZIO XT patch monitor. Call them immediately if you see an orange light blinking on your  monitor.  If your monitor falls off in less than 4 days, contact our Monitor department at [phone].  If your monitor becomes loose or falls off after 4 days call [employer] at [phone] for  suggestions on securing your monitor   Follow-Up: At [facility], you and your health needs are our priority.  As part of our continuing mission to provide you with exceptional heart care, our providers are all part of one team.  This team includes your primary Cardiologist (physician) and Advanced Practice Providers or APPs (Physician Assistants and Nurse Practitioners) who all work together to provide you with the care you need, when you need it.  Your next appointment:   8-10 week(s)  Provider:   Dareen Gutzwiller, DO    Other Instructions Impact of Community Health Workers in Improving Maternal Cardiovascular health in the  Postpartum Period  You are being asked to participate in a research study conducted by Linday Rhodes DO, MS, Cornell Finder, CNM, Olu Jegede, MD, from the [facility] at [facility]. The purpose of the study is to provide early interventions to high-risk patients using community health workers and community support groups. These interactions will be via phone/telehealth visits and in-person community-based support groups. In addition, this study aims to improve postpartum navigation into medical and social care.   You qualify for this study because you are a woman who is receiving prenatal care at [facility] in the third trimester of pregnancy. You may participate in this research study if you are black and have one additional risk factor.  If you decide to  participate in this study, the following will occur:  Referral into program and then in-person invitation from [employer] (CHW) in third trimester (or via phone after delivery) ? Ensure patient received standard postpartum hypertension protocols  3-4 day postpartum check in via phone call/virtual visit with CHW - review postpartum health and wellness tools  Invitation to support groups   CHW to touch base weekly until 4-6 weeks then monthly through the first year.   Your data will be collected by the study team. To minimize the risk to your confidentiality this data will be kept on electronic system that is password protected and access is limited to the study team.  The benefit of this study is that you will have a close follow up on your blood pressure for good control and you will be connected to clinical care in appropriate timing should you have elevated blood pressure.  You can choose whether to be in this study or not.  If you volunteer to be in this study, you may withdraw at any time without consequences of any kind. You may also refuse to answer any questions you dont want to answer and still remain in the study. The investigator may withdraw you from this research if circumstances arise which warrant doing so. All personal identifying information are kept in a secure [employer] database available only to the statistician, designer, industrial/product, and Principal Investigator (PI).   I have had this study explained to me and I have been given the opportunity to ask questions and discuss my concerns. If I have any further questions about the study, I may call the principal investigator, (Dr. Tahji Le Roy, [phone]). I understand that I may withdraw from this study at any time without interfering with my medical care.  You are not waiving any legal claims, rights or remedies because of your participation in this research study. If you have questions regarding your rights as a  publishing rights manager, contact the [place] IRB at 6090634770 ([email or web address]), [place], Institutional Review Board, [address] [place], [place] [address]    Dispo:  No follow-ups on file.   Medication Adjustments/Labs and Tests Ordered: Current medicines are reviewed at length with the patient today.  Concerns regarding medicines are outlined above.  Tests Ordered: Orders Placed This Encounter  Procedures   LONG TERM MONITOR (3-14 DAYS)   ECHOCARDIOGRAM COMPLETE   Medication Changes: No orders of the defined types were placed in this encounter.     [1]  Current Meds  Medication Sig   acetaminophen -caffeine  (EXCEDRIN  TENSION HEADACHE) 500-65 MG TABS per tablet Take 2 tablets by mouth every 6 (six) hours as needed (for headache).   albuterol  (VENTOLIN  HFA) 108 (90 Base) MCG/ACT inhaler Inhale 1-2 puffs into the lungs every 6 (six) hours as needed for wheezing or shortness of breath.   aspirin  EC 81 MG tablet Take 1 tablet (81 mg total) by mouth daily. Take after 12 weeks for prevention of preeclampsia later in pregnancy   Blood Pressure Monitoring (BLOOD PRESSURE KIT) DEVI 1 Device by Does not apply route once a week.   busPIRone  (BUSPAR ) 5 MG tablet Take 1 tablet (5 mg total) by mouth 3 (three) times daily.   cetirizine  (ZYRTEC ) 10 MG tablet Take 1 tablet (10 mg total) by mouth daily.   cyclobenzaprine  (FLEXERIL ) 10 MG tablet Take 1 tablet (10 mg total) by mouth 2 (two) times daily as needed.   diphenhydrAMINE  (BENADRYL ) 25 MG tablet Take 1-2 tablets (25-50 mg total) by mouth every 6 (six) hours as needed (for headache or dizziness).   Doxylamine -Pyridoxine  (DICLEGIS ) 10-10 MG TBEC Take 1 tablet by mouth See admin instructions. (Diclegis (R)) Initial, 2 tablets (doxylamine  succinate 10 mg/pyridoxine  hydrochloride 10 mg per tablet) orally at bedtime on day 1 and 2; if symptoms persist, take 1 tablet in morning and 2 tablets at bedtime on day 3; if symptoms persist, may  increase to MAX 4 tablets per day, administered as 1 tablet in the morning, 1 tablet in mid-afternoon and 2 tablets at bedtime   EPINEPHrine  (EPIPEN  2-PAK) 0.3 mg/0.3 mL IJ SOAJ injection Inject 0.3 mg into the muscle once as needed (for severe allergic reaction). CAll 911 immediately if you have to use this medicine   gabapentin  (NEURONTIN ) 100 MG capsule Take 2 capsules (200 mg total) by mouth at bedtime.   hydrOXYzine  (VISTARIL ) 25 MG capsule TAKE 1 CAPSULE (25 MG TOTAL) BY MOUTH AT BEDTIME AS NEEDED FOR ANXIETY.   lamoTRIgine  (  LAMICTAL ) 200 MG tablet Take 1 tablet (200 mg total) by mouth 3 (three) times daily.   levETIRAcetam  (KEPPRA ) 750 MG tablet Take 2 tablets (1,500 mg total) by mouth 2 (two) times daily.   meclizine  (ANTIVERT ) 25 MG tablet Take 1 tablet (25 mg total) by mouth 3 (three) times daily as needed for dizziness.   metoCLOPramide  (REGLAN ) 10 MG tablet Take 1 tablet (10 mg total) by mouth every 8 (eight) hours as needed (for nausea or headache).   ondansetron  (ZOFRAN -ODT) 4 MG disintegrating tablet Take 1 tablet (4 mg total) by mouth every 8 (eight) hours as needed for refractory nausea / vomiting.   pantoprazole  (PROTONIX ) 40 MG tablet Take 1 tablet (40 mg total) by mouth 2 (two) times daily before a meal.   polyethylene glycol powder (MIRALAX ) 17 GM/SCOOP powder Take 17 g by mouth 2 (two) times daily. Dissolve 1 capful (17g) in 4-8 ounces of liquid and take by mouth daily.   Prenat-FeCbn-FeAsp-Meth-FA-DHA (PRENATE MINI ) 18-0.6-0.4-350 MG CAPS Take 1 capsule by mouth daily before breakfast.   sertraline  (ZOLOFT ) 100 MG tablet Take 1 tablet (100 mg total) by mouth daily.   SYMBICORT  80-4.5 MCG/ACT inhaler Inhale 2 puffs into the lungs 2 (two) times daily as needed (shortness of breath).   terconazole  (TERAZOL 3 ) 0.8 % vaginal cream Place 1 applicator vaginally at bedtime.   "

## 2023-12-31 LAB — GC/CHLAMYDIA PROBE AMP (~~LOC~~) NOT AT ARMC
Chlamydia: NEGATIVE
Comment: NEGATIVE
Comment: NORMAL
Neisseria Gonorrhea: NEGATIVE

## 2024-01-01 ENCOUNTER — Ambulatory Visit (INDEPENDENT_AMBULATORY_CARE_PROVIDER_SITE_OTHER): Admitting: Obstetrics

## 2024-01-01 ENCOUNTER — Telehealth: Payer: Self-pay

## 2024-01-01 ENCOUNTER — Encounter: Payer: Self-pay | Admitting: Obstetrics

## 2024-01-01 ENCOUNTER — Other Ambulatory Visit: Payer: Self-pay

## 2024-01-01 VITALS — BP 135/81 | HR 106 | Wt 329.8 lb

## 2024-01-01 DIAGNOSIS — G43709 Chronic migraine without aura, not intractable, without status migrainosus: Secondary | ICD-10-CM | POA: Diagnosis not present

## 2024-01-01 DIAGNOSIS — O26899 Other specified pregnancy related conditions, unspecified trimester: Secondary | ICD-10-CM

## 2024-01-01 DIAGNOSIS — F419 Anxiety disorder, unspecified: Secondary | ICD-10-CM

## 2024-01-01 DIAGNOSIS — Z87898 Personal history of other specified conditions: Secondary | ICD-10-CM | POA: Diagnosis not present

## 2024-01-01 DIAGNOSIS — O0992 Supervision of high risk pregnancy, unspecified, second trimester: Secondary | ICD-10-CM

## 2024-01-01 DIAGNOSIS — D508 Other iron deficiency anemias: Secondary | ICD-10-CM

## 2024-01-01 DIAGNOSIS — O99210 Obesity complicating pregnancy, unspecified trimester: Secondary | ICD-10-CM

## 2024-01-01 DIAGNOSIS — H818X9 Other disorders of vestibular function, unspecified ear: Secondary | ICD-10-CM | POA: Diagnosis not present

## 2024-01-01 DIAGNOSIS — J4530 Mild persistent asthma, uncomplicated: Secondary | ICD-10-CM

## 2024-01-01 DIAGNOSIS — Z3A17 17 weeks gestation of pregnancy: Secondary | ICD-10-CM | POA: Diagnosis not present

## 2024-01-01 DIAGNOSIS — Z006 Encounter for examination for normal comparison and control in clinical research program: Secondary | ICD-10-CM

## 2024-01-01 DIAGNOSIS — O0990 Supervision of high risk pregnancy, unspecified, unspecified trimester: Secondary | ICD-10-CM

## 2024-01-01 DIAGNOSIS — O99212 Obesity complicating pregnancy, second trimester: Secondary | ICD-10-CM

## 2024-01-01 NOTE — Progress Notes (Signed)
 Pt presents for ROB visit. Pt reports bleeding and was seen at [facility] for this 12-28-23. Denies bleeding today. Pt also reports migraines.  Requesting refill of Diclegis 

## 2024-01-01 NOTE — Progress Notes (Signed)
 Subjective:  Latoya Stark is a 28 y.o. H0E7937 at [redacted]w[redacted]d being seen today for ongoing prenatal care.  She is currently monitored for the following issues for this high-risk pregnancy and has Other constipation; History of seizure; Conversion disorder; BMI 50.0-59.9, adult (HCC); Chronic migraine without aura without status migrainosus, not intractable; Dysphagia; Lipoma of neck; Mild persistent asthma without complication; Anemia; Mild episode of recurrent major depressive disorder; Anxiety; Class 3 severe obesity due to excess calories with serious comorbidity and body mass index (BMI) of 50.0 to 59.9 in adult [facility]); Chronic midline low back pain with bilateral sciatica; Supervision of high risk pregnancy, antepartum; Numbness of hand; and Vertigo on their problem list.  Patient reports increasing dizziness and near fainting episodes.  Contractions: Not present. Vag. Bleeding: None.  Movement: Present. Denies leaking of fluid.   The following portions of the patient's history were reviewed and updated as appropriate: allergies, current medications, past family history, past medical history, past social history, past surgical history and problem list. Problem list updated.  Objective:   Vitals:   01/01/24 1614  BP: 135/81  Pulse: (!) 106  Weight: (!) 329 lb 12.8 oz (149.6 kg)    Fetal Status:     Movement: Present     General:  Alert, oriented and cooperative. Patient is in no acute distress.  Skin: Skin is warm and dry. No rash noted.   Cardiovascular: Normal heart rate noted  Respiratory: Normal respiratory effort, no problems with respiration noted  Abdomen: Soft, gravid, appropriate for gestational age. Pain/Pressure: Present     Pelvic:  Cervical exam deferred        Extremities: Normal range of motion.  Edema: Trace  Mental Status: Normal mood and affect. Normal behavior. Normal judgment and thought content.   Urinalysis:      Assessment and Plan:  Pregnancy: [id number] at  [redacted]w[redacted]d  1. Supervision of high risk pregnancy, antepartum (Primary)  2. [redacted] weeks gestation of pregnancy Rx: - AFP, Serum, Open Spina Bifida  3. Persistent postural-perceptual dizziness - Referred to Cardiology  Or  assessment and management recommendations  4. Iron deficiency anemia secondary to inadequate dietary iron intake - iron Rx  5. History of seizure - clinically stable  6. Chronic migraine without aura without status migrainosus, not intractable - clinically stable  7. Mild persistent asthma without complication - clinically stable  8. Obesity affecting pregnancy, antepartum, unspecified obesity type    Preterm labor symptoms and general obstetric precautions including but not limited to vaginal bleeding, contractions,cally stable.  Managed by PCP leaking of fluid and fetal movement were reviewed in detail with the patient. Please refer to After Visit Summary for other counseling recommendations.   Return in about 4 weeks (around 01/29/2024) for ROB.   Rudy Carlin LABOR, MD 01/01/2024

## 2024-01-01 NOTE — Telephone Encounter (Signed)
 Called pt regarding [employer], pt did not answer. I left a voicemail with my contact information & the reason for my call.

## 2024-01-02 ENCOUNTER — Other Ambulatory Visit (HOSPITAL_COMMUNITY): Payer: Self-pay

## 2024-01-05 LAB — AFP, SERUM, OPEN SPINA BIFIDA
AFP MoM: 1.2
AFP Value: 31.7 ng/mL
Gest. Age on Collection Date: 17 wk
Maternal Age At EDD: 29.9 a
OSBR Risk 1 IN: 10000
Test Results:: NEGATIVE
Weight: 329 [lb_av]

## 2024-01-09 ENCOUNTER — Telehealth: Payer: Self-pay

## 2024-01-09 NOTE — Telephone Encounter (Signed)
 S/W pt to discuss paperwork and leave for work per last visit with Dr. Rudy. On 12/23. At the last visit, the pt discussed starting a continuous leave until she sees cardiology on 2/13 related to dizzy spells. Today the pt states that she does not want to take a continuous leave because she has used all of FMLA due to being on a continuous leave from September to 12/1, and she needs to get her finances together. 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 She states that her manager has since removed the light abover her and is getting her a screen cover for her computer. She is requesting intermittent leave for potential sick days, related to her pregnancy, and hx of migraines, and epilepsy. Advised pt that I would inform the provider for review.

## 2024-01-12 ENCOUNTER — Other Ambulatory Visit: Payer: Self-pay

## 2024-01-12 ENCOUNTER — Inpatient Hospital Stay (HOSPITAL_COMMUNITY)
Admission: AD | Admit: 2024-01-12 | Discharge: 2024-01-13 | Disposition: A | Discharge status: Home / Self Care | Attending: Family Medicine | Admitting: Family Medicine

## 2024-01-12 ENCOUNTER — Encounter (HOSPITAL_COMMUNITY): Payer: Self-pay | Admitting: Family Medicine

## 2024-01-12 ENCOUNTER — Inpatient Hospital Stay (HOSPITAL_COMMUNITY): Admission: AD | Admit: 2024-01-12 | Source: Home / Self Care | Admitting: Family Medicine

## 2024-01-12 DIAGNOSIS — K5909 Other constipation: Secondary | ICD-10-CM | POA: Insufficient documentation

## 2024-01-12 DIAGNOSIS — O479 False labor, unspecified: Secondary | ICD-10-CM

## 2024-01-12 DIAGNOSIS — O99612 Diseases of the digestive system complicating pregnancy, second trimester: Secondary | ICD-10-CM | POA: Diagnosis not present

## 2024-01-12 DIAGNOSIS — Z3A18 18 weeks gestation of pregnancy: Secondary | ICD-10-CM | POA: Diagnosis not present

## 2024-01-12 DIAGNOSIS — G8929 Other chronic pain: Secondary | ICD-10-CM | POA: Diagnosis not present

## 2024-01-12 DIAGNOSIS — M549 Dorsalgia, unspecified: Secondary | ICD-10-CM | POA: Insufficient documentation

## 2024-01-12 DIAGNOSIS — Z0371 Encounter for suspected problem with amniotic cavity and membrane ruled out: Secondary | ICD-10-CM | POA: Diagnosis present

## 2024-01-12 DIAGNOSIS — O99212 Obesity complicating pregnancy, second trimester: Secondary | ICD-10-CM | POA: Diagnosis not present

## 2024-01-12 DIAGNOSIS — O4702 False labor before 37 completed weeks of gestation, second trimester: Secondary | ICD-10-CM | POA: Diagnosis not present

## 2024-01-12 DIAGNOSIS — M7918 Myalgia, other site: Secondary | ICD-10-CM | POA: Diagnosis not present

## 2024-01-12 DIAGNOSIS — O99891 Other specified diseases and conditions complicating pregnancy: Secondary | ICD-10-CM | POA: Insufficient documentation

## 2024-01-12 DIAGNOSIS — Z3492 Encounter for supervision of normal pregnancy, unspecified, second trimester: Secondary | ICD-10-CM

## 2024-01-12 LAB — URINALYSIS, ROUTINE W REFLEX MICROSCOPIC
Bilirubin Urine: NEGATIVE
Glucose, UA: NEGATIVE mg/dL
Hgb urine dipstick: NEGATIVE
Ketones, ur: NEGATIVE mg/dL
Leukocytes,Ua: NEGATIVE
Nitrite: NEGATIVE
Protein, ur: NEGATIVE mg/dL
Specific Gravity, Urine: 1.025 (ref 1.005–1.030)
pH: 6 (ref 5.0–8.0)

## 2024-01-12 NOTE — MAU Note (Signed)
 Latoya Stark is a 29 y.o. at [redacted]w[redacted]d here in MAU reporting: vaginal pressure and leaking of clear fluid that started around 2200. Reports started having ctxs that started about 20 minutes ago. States that pain is constant and is her lower abdomen. Denies VB.  LMP: n/a Onset of complaint: 2200 Pain score: 10/10 Vitals:   01/12/24 2239  BP: 119/80  Pulse: (!) 115  Resp: 20  Temp: 98.2 F (36.8 C)  SpO2: 99%     FHT: 145  Lab orders placed from triage: fern and urine

## 2024-01-12 NOTE — MAU Provider Note (Signed)
 Chief Complaint:  Contractions and Rupture of Membranes   HPI   None     Latoya Stark is a 29 y.o. H0E7937 at [redacted]w[redacted]d who presents to maternity admissions reporting abdominal pain and pressure in rectum that started while she was at her sister's house, approximately 40 minutes prior to current encounter. She reports pain was previously constant, but no longer having this pain. Pressure in rectum also improved. She reports she went to the bathroom to empty her bladder, but was unable to void, after that she reports she had leaking of clear fluid.   She denies recent intercourse or vaginal bleeding. She denies changes in bowel or bladder habits. Reports chronic constipation and chronic back pain.  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   started at age 27   Headache    Obesity    Pneumonia    Postpartum depression, postpartum condition 08/11/2021   Seizures (HCC)    when I get too hot   Trichomonas infection    Umbilical hernia 04/2022   UTI (lower urinary tract infection)    Vulvar abscess 08/11/2021   OB History  Gravida Para Term Preterm AB Living  9 2 2  6 2   SAB IAB Ectopic Multiple Live Births  4 1  0 2    # Outcome Date GA Lbr Len/2nd Weight Sex Type Anes PTL Lv  9 Current           8 Term 06/22/21 [redacted]w[redacted]d 03:19 / 00:04 3190 g M Vag-Spont EPI  LIV  7 AB 10/08/19 [redacted]w[redacted]d    SAB     6 Term 03/01/19 [redacted]w[redacted]d / 00:07 3455  g M Vag-Spont EPI  LIV  5 SAB 10/10/14          4 SAB           3 IAB           2 SAB           1 SAB            Past Surgical History:  Procedure Laterality Date   COLONOSCOPY N/A 05/22/2023   Procedure: COLONOSCOPY;  Surgeon: Therisa Bi, MD;  Location: [facility] ENDOSCOPY;  Service: Gastroenterology;  Laterality: N/A;   DILATION AND EVACUATION N/A 08/09/2013   Procedure: DILATATION AND EVACUATION;  Surgeon: Aida DELENA Na, MD;  Location: [facility] ORS;  Service: Gynecology;  Laterality: N/A;   DILATION AND EVACUATION  2017   ESOPHAGOGASTRODUODENOSCOPY (EGD) WITH PROPOFOL  N/A 11/10/2022   Procedure: ESOPHAGOGASTRODUODENOSCOPY (EGD) WITH PROPOFOL ;  Surgeon: Therisa Bi, MD;  Location: [facility];  Service: Gastroenterology;  Laterality: N/A;   EUSTACHIAN TUBE DILATION     EXCISION MASS NECK N/A 11/16/2022   Procedure: EXCISION MASS NECK;  Surgeon: Jordis Laneta FALCON, MD;  Location: [facility] ORS;  Service: General;  Laterality: N/A;  JP drain placed  LACERATION REPAIR Left 2007   5th toe   ORTHOPEDIC SURGERY Left 2010   hand fracture; screws in place   TONSILLECTOMY AND ADENOIDECTOMY  2001   TYMPANOSTOMY TUBE PLACEMENT Bilateral 2002   UMBILICAL HERNIA REPAIR N/A 05/12/2022   Procedure: LAPAROSCOPIC ASSISTED SUPRA UMBILICAL HERNIA REPAIR;  Surgeon: Gladis Cough, MD;  Location: [facility] ORS;  Service: General;  Laterality: N/A;   Family History  Problem Relation Age of Onset   Heart disease Mother    Depression Mother    Cancer Mother        Bone cancer   Seizures Mother    Stroke Mother    Sickle cell anemia Mother    Hyperlipidemia Father    Heart disease Father    Hypertension Father    Sleep apnea Father    Seizures Maternal Aunt    Diabetes Maternal Grandmother    Colon cancer Maternal Grandmother    Hypertension Maternal Grandmother    Clotting disorder Maternal Grandmother    Breast cancer Maternal Grandmother    Stroke Maternal Grandmother    Diabetes Paternal Grandmother     Social History[1] Allergies[2] No medications prior to admission.    I have reviewed patient's Past Medical Hx, Surgical Hx, Family Hx, Social Hx, medications and allergies.   ROS  Pertinent items noted in HPI and remainder of comprehensive ROS otherwise negative.   PHYSICAL EXAM  Patient Vitals for the past 24 hrs:  BP Temp Temp src Pulse Resp SpO2 Height Weight  01/13/24 0125 123/69 -- -- 95 -- -- -- --  01/12/24 2239 119/80 98.2 F (36.8 C) Oral (!) 115 20 99 % 5' 5 (1.651 m) (!) 143.8 kg    Physical Exam Vitals and nursing note reviewed. Exam conducted with a chaperone present.  Constitutional:      General: She is not in acute distress.    Appearance: Normal appearance. She is obese. She is not ill-appearing, toxic-appearing or diaphoretic.  HENT:     Head: Normocephalic.  Cardiovascular:     Rate and Rhythm: Normal rate.     Pulses: Normal pulses.  Pulmonary:     Effort: Pulmonary effort is normal.  Genitourinary:    General: Normal vulva.     Exam position: Lithotomy position.     Vagina: Normal. No bleeding.     Cervix: No discharge or cervical bleeding.  Skin:    General: Skin is warm and dry.     Capillary Refill: Capillary refill takes less than 2 seconds.  Neurological:     General: No focal deficit present.     Mental Status: She is alert and oriented to person, place, and time.  Psychiatric:        Mood and Affect: Mood normal.        Behavior: Behavior normal.        Thought Content: Thought content normal.        Judgment: Judgment normal.      Dilation: Closed Effacement (%): Thick Exam by:: Camie [place] CNM  Fetal Heart Tones: Doppler: 145    Labs: Results for orders placed or performed during the hospital encounter of 01/12/24 (from the past 24 hours)  Urinalysis, Routine w reflex microscopic -Urine, Clean Catch     Status: None   Collection Time: 01/12/24 11:13 PM  Result Value Ref Range   Color, Urine YELLOW YELLOW    APPearance CLEAR CLEAR   Specific Gravity, Urine 1.025 1.005 - 1.030   pH 6.0 5.0 -  8.0   Glucose, UA NEGATIVE NEGATIVE mg/dL   Hgb urine dipstick NEGATIVE NEGATIVE   Bilirubin Urine NEGATIVE NEGATIVE   Ketones, ur NEGATIVE NEGATIVE mg/dL   Protein, ur NEGATIVE NEGATIVE mg/dL   Nitrite NEGATIVE NEGATIVE   Leukocytes,Ua NEGATIVE NEGATIVE  Wet prep, genital     Status: None   Collection Time: 01/13/24 12:00 AM   Specimen: Vaginal  Result Value Ref Range   Yeast Wet Prep HPF POC NONE SEEN NONE SEEN   Trich, Wet Prep NONE SEEN NONE SEEN   Clue Cells Wet Prep HPF POC NONE SEEN NONE SEEN   WBC, Wet Prep HPF POC <10 <10   Sperm NONE SEEN   Rupture of Membrane (ROM) Plus     Status: None   Collection Time: 01/13/24 12:00 AM  Result Value Ref Range   Rom Plus NEGATIVE   Fern Test     Status: Normal   Collection Time: 01/13/24 12:24 AM  Result Value Ref Range   POCT Fern Test Negative = intact amniotic membranes    *Note: Due to a large number of results and/or encounters for the requested time period, some results have not been displayed. A complete set of results can be found in Results Review.    Imaging:  No results found.  MDM & MAU COURSE  MDM:  Moderate  MAU Course:  Evaluate PTL: Cervix CTH, no abdominal pain. Chronic back pain without change.  Evaluate ROM: fern test neg, ROM plus negative, no pooling noted.  No vaginal infection noted. UA benign.  Unclear cause of previous discomfort.  Flexeril  given per request as she reports chronic back pain.   Orders Placed This Encounter  Procedures   Wet prep, genital   Urinalysis, Routine w reflex microscopic -Urine, Clean Catch   Rupture of Membrane (ROM) Plus   Fern Test   Discharge patient   Meds ordered this encounter  Medications   cyclobenzaprine  (FLEXERIL ) tablet 10 mg    ASSESSMENT   1. Intact amniotic membranes during pregnancy in second trimester   2. Musculoskeletal pain   3. False labor     PLAN   Discharge home in stable condition. May return to MAU as needed.   Follow up as scheduled at [facility].    Follow-up Information     [facility] Follow up.   Contact information: [address] [place]  [address] [phone]                Allergies as of 01/13/2024       Reactions   Hydrocodone  Hives   Latex Itching, Swelling   Milk-related Compounds Dermatitis   Breaks face out *can have 2% milk*   Mushroom Extract Complex (obsolete) Anaphylaxis   Penicillins Anaphylaxis   Has patient had a PCN reaction causing immediate rash, facial/tongue/throat swelling, SOB or lightheadedness with hypotension: Yes Has patient had a PCN reaction causing severe rash involving mucus membranes or skin necrosis: No Has patient had a PCN reaction that required hospitalization No Has patient had a PCN reaction occurring within the last 10 years: Yes If all of the above answers are NO, then may proceed with Cephalosporin use. Has patient had a PCN reaction causing immediate rash, facial/tongue/throat swelling, SOB or lightheadedness with hypotension: Yes Has patient had a PCN reaction causing severe rash involving mucus membranes or skin necrosis: No Has patient had a PCN reaction that required hospitalization No Has patient had a PCN reaction occurring within the  last 10 years: Yes If all of the above answers are NO, then may proceed with Cephalosporin use.    Has patient had a PCN reaction causing immediate rash, facial/tongue/throat swelling, SOB or lightheadedness with hypotension: Yes Has patient had a PCN reaction causing severe rash involving mucus membranes or skin necrosis: No Has patient had a PCN reaction that required hospitalization No Has patient had a PCN reaction occurring within the last 10 years: Yes If all of the above answers are NO, then may proceed with Cephalosporin use.   Cheese Other (See Comments)   Breaks face out   Sulfa  Antibiotics Swelling        Medication List     STOP taking these medications    terconazole  0.8 % vaginal cream Commonly known as: TERAZOL 3        TAKE these medications    aspirin  EC 81 MG tablet Take 1 tablet (81 mg total) by mouth daily. Take after 12 weeks for prevention of preeclampsia later in pregnancy   Blood Pressure Kit Devi 1 Device by Does not apply route once a week.   busPIRone  5 MG tablet Commonly known as: BUSPAR  Take 1 tablet (5 mg total) by mouth 3 (three) times daily.   cetirizine  10 MG tablet Commonly known as: ZYRTEC  Take 1 tablet (10 mg total) by mouth daily.   cyclobenzaprine  10 MG tablet Commonly known as: FLEXERIL  Take 1 tablet (10 mg total) by mouth 2 (two) times daily as needed.   diphenhydrAMINE  25 MG tablet Commonly known as: BENADRYL  Take 1-2 tablets (25-50 mg total) by mouth every 6 (six) hours as needed (for headache or dizziness).   Doxylamine -Pyridoxine  10-10 MG Tbec Commonly known as: Diclegis  Take 1 tablet by mouth See admin instructions. (Diclegis (R)) Initial, 2 tablets (doxylamine  succinate 10 mg/pyridoxine  hydrochloride 10 mg per tablet) orally at bedtime on day 1 and 2; if symptoms persist, take 1 tablet in morning and 2 tablets at bedtime on day 3; if symptoms persist, may increase to MAX 4 tablets per day, administered as 1 tablet in the morning, 1 tablet in mid-afternoon and 2 tablets at bedtime   EPINEPHrine  0.3 mg/0.3 mL Soaj injection Commonly known as: EpiPen  2-Pak Inject 0.3 mg into the muscle once as needed (for severe allergic reaction). CAll 911 immediately if you have to use this medicine   Excedrin  Tension Headache 500-65 MG Tabs per tablet Generic drug: acetaminophen -caffeine  Take 2 tablets by mouth every 6 (six) hours as needed (for headache).   gabapentin  100 MG capsule Commonly known as: NEURONTIN  Take 2 capsules (200 mg total) by mouth at bedtime.   hydrOXYzine  25 MG capsule Commonly known as:  VISTARIL  TAKE 1 CAPSULE (25 MG TOTAL) BY MOUTH AT BEDTIME AS NEEDED FOR ANXIETY.   lamoTRIgine  200 MG tablet Commonly known as: LAMICTAL  Take 1 tablet (200 mg total) by mouth 3 (three) times daily.   levETIRAcetam  750 MG tablet Commonly known as: KEPPRA  Take 2 tablets (1,500 mg total) by mouth 2 (two) times daily.   meclizine  25 MG tablet Commonly known as: ANTIVERT  Take 1 tablet (25 mg total) by mouth 3 (three) times daily as needed for dizziness.   metoCLOPramide  10 MG tablet Commonly known as: Reglan  Take 1 tablet (10 mg total) by mouth every 8 (eight) hours as needed (for nausea or headache).   ondansetron  4 MG disintegrating tablet Commonly known as: ZOFRAN -ODT Take 1 tablet (4 mg total) by mouth every 8 (eight) hours as needed for refractory nausea /  vomiting.   pantoprazole  40 MG tablet Commonly known as: Protonix  Take 1 tablet (40 mg total) by mouth 2 (two) times daily before a meal.   polyethylene glycol powder 17 GM/SCOOP powder Commonly known as: MiraLax  Take 17 g by mouth 2 (two) times daily. Dissolve 1 capful (17g) in 4-8 ounces of liquid and take by mouth daily.   Prenate Mini  18-0.6-0.4-350 MG Caps Take 1 capsule by mouth daily before breakfast.   sertraline  100 MG tablet Commonly known as: ZOLOFT  Take 1 tablet (100 mg total) by mouth daily.   Symbicort  80-4.5 MCG/ACT inhaler Generic drug: budesonide -formoterol  Inhale 2 puffs into the lungs 2 (two) times daily as needed (shortness of breath).   Ventolin  HFA 108 (90 Base) MCG/ACT inhaler Generic drug: albuterol  Inhale 1-2 puffs into the lungs every 6 (six) hours as needed for wheezing or shortness of breath.        Camie Rote, MSN, CNM 01/13/2024 1:49 AM  Certified Nurse Midwife,  [facility]        [1]  Social History Tobacco Use   Smoking status: Never    Passive exposure: Never   Smokeless tobacco: Never  Vaping Use   Vaping status: Never Used  Substance Use Topics    Alcohol use: Not Currently    Comment: occassional   Drug use: No  [2]  Allergies Allergen Reactions   Hydrocodone  Hives   Latex Itching and Swelling   Milk-Related Compounds Dermatitis    Breaks face out *can have 2% milk*   Mushroom Extract Complex (Obsolete) Anaphylaxis   Penicillins Anaphylaxis    Has patient had a PCN reaction causing immediate rash, facial/tongue/throat swelling, SOB or lightheadedness with hypotension: Yes  Has patient had a PCN reaction causing severe rash involving mucus membranes or skin necrosis: No  Has patient had a PCN reaction that required hospitalization No  Has patient had a PCN reaction occurring within the last 10 years: Yes  If all of the above answers are NO, then may proceed with Cephalosporin use.  Has patient had a PCN reaction causing immediate rash, facial/tongue/throat swelling, SOB or lightheadedness with hypotension: Yes Has patient had a PCN reaction causing severe rash involving mucus membranes or skin necrosis: No Has patient had a PCN reaction that required hospitalization No Has patient had a PCN reaction occurring within the last 10 years: Yes If all of the above answers are NO, then may proceed with Cephalosporin use.    Has patient had a PCN reaction causing immediate rash, facial/tongue/throat swelling, SOB or lightheadedness with hypotension: Yes Has patient had a PCN reaction causing severe rash involving mucus membranes or skin necrosis: No Has patient had a PCN reaction that required hospitalization No Has patient had a PCN reaction occurring within the last 10 years: Yes If all of the above answers are NO, then may proceed with Cephalosporin use.   Cheese Other (See Comments)    Breaks face out   Sulfa Antibiotics Swelling

## 2024-01-13 DIAGNOSIS — Z3A18 18 weeks gestation of pregnancy: Secondary | ICD-10-CM

## 2024-01-13 DIAGNOSIS — Z0371 Encounter for suspected problem with amniotic cavity and membrane ruled out: Secondary | ICD-10-CM

## 2024-01-13 LAB — WET PREP, GENITAL
Clue Cells Wet Prep HPF POC: NONE SEEN
Sperm: NONE SEEN
Trich, Wet Prep: NONE SEEN
WBC, Wet Prep HPF POC: <10
Yeast Wet Prep HPF POC: NONE SEEN

## 2024-01-13 LAB — POCT FERN TEST: POCT Fern Test: NEGATIVE

## 2024-01-13 LAB — RUPTURE OF MEMBRANE (ROM)PLUS: Rom Plus: NEGATIVE

## 2024-01-13 MED ORDER — CYCLOBENZAPRINE HCL 10 MG PO TABS
10.0000 mg | ORAL_TABLET | ORAL | Status: AC
  Administered 2024-01-13: 10 mg via ORAL
  Filled 2024-01-13: qty 1

## 2024-01-14 ENCOUNTER — Telehealth: Payer: Self-pay | Admitting: Family Medicine

## 2024-01-14 LAB — GC/CHLAMYDIA PROBE AMP (~~LOC~~) NOT AT ARMC
Chlamydia: NEGATIVE
Comment: NEGATIVE
Comment: NORMAL
Neisseria Gonorrhea: NEGATIVE

## 2024-01-14 NOTE — Telephone Encounter (Signed)
 Pt would like to set up home sleep study that Amy ordered

## 2024-01-15 ENCOUNTER — Other Ambulatory Visit: Payer: Self-pay | Admitting: Nurse Practitioner

## 2024-01-15 ENCOUNTER — Other Ambulatory Visit: Payer: Self-pay

## 2024-01-15 DIAGNOSIS — F330 Major depressive disorder, recurrent, mild: Secondary | ICD-10-CM

## 2024-01-15 DIAGNOSIS — F419 Anxiety disorder, unspecified: Secondary | ICD-10-CM

## 2024-01-15 MED FILL — Cetirizine HCl Tab 10 MG: ORAL | 30 days supply | Qty: 30 | Fill #0 | Status: CN

## 2024-01-15 NOTE — Telephone Encounter (Signed)
 HST updated auth [employer] auth: [id number] (exp. 01/15/24 to 04/14/24)

## 2024-01-16 ENCOUNTER — Other Ambulatory Visit: Payer: Self-pay

## 2024-01-17 ENCOUNTER — Other Ambulatory Visit: Payer: Self-pay

## 2024-01-17 MED ORDER — BUSPIRONE HCL 5 MG PO TABS
5.0000 mg | ORAL_TABLET | Freq: Three times a day (TID) | ORAL | 0 refills | Status: AC
  Filled 2024-01-17 – 2024-02-06 (×2): qty 90, 30d supply, fill #0

## 2024-01-17 NOTE — Telephone Encounter (Signed)
 Requested medications are due for refill today.  yes  Requested medications are on the active medications list.  yes  Last refill. 08/20/2023 #90 0 rf  Future visit scheduled.     Notes to clinic.  Pt is pregnant. Please review for refill.    Requested Prescriptions  Pending Prescriptions Disp Refills   busPIRone  (BUSPAR ) 5 MG tablet 90 tablet 0    Sig: Take 1 tablet (5 mg total) by mouth 3 (three) times daily.     Psychiatry: Anxiolytics/Hypnotics - Non-controlled Passed - 01/17/2024 11:12 AM      Passed - Valid encounter within last 12 months    Recent Outpatient Visits           3 months ago Erroneous encounter - disregard   [facility] Gareth Mliss FALCON, FNP   3 months ago Mesenteric lymphadenopathy   [facility] Gareth Mliss FALCON, FNP   4 months ago Lower abdominal pain   [facility] Leavy Mole, PA-C   4 months ago Mild episode of recurrent major depressive disorder   [facility] Gareth Mliss FALCON, FNP   6 months ago Class 3 severe obesity due to excess calories with serious comorbidity and body mass index (BMI) of 50.0 to 59.9 in adult   [facility] Gareth Mliss FALCON, FNP       Future Appointments             In 1 month Tobb, Kardie, DO [facility] at [facility]

## 2024-01-24 ENCOUNTER — Ambulatory Visit: Admitting: Maternal & Fetal Medicine

## 2024-01-24 ENCOUNTER — Other Ambulatory Visit

## 2024-01-24 VITALS — BP 125/63 | HR 111

## 2024-01-24 DIAGNOSIS — O99212 Obesity complicating pregnancy, second trimester: Secondary | ICD-10-CM

## 2024-01-24 DIAGNOSIS — O2622 Pregnancy care for patient with recurrent pregnancy loss, second trimester: Secondary | ICD-10-CM | POA: Diagnosis not present

## 2024-01-24 DIAGNOSIS — Z7982 Long term (current) use of aspirin: Secondary | ICD-10-CM | POA: Diagnosis not present

## 2024-01-24 DIAGNOSIS — E669 Obesity, unspecified: Secondary | ICD-10-CM | POA: Diagnosis not present

## 2024-01-24 DIAGNOSIS — O99352 Diseases of the nervous system complicating pregnancy, second trimester: Secondary | ICD-10-CM

## 2024-01-24 DIAGNOSIS — Z79899 Other long term (current) drug therapy: Secondary | ICD-10-CM | POA: Insufficient documentation

## 2024-01-24 DIAGNOSIS — O0990 Supervision of high risk pregnancy, unspecified, unspecified trimester: Secondary | ICD-10-CM

## 2024-01-24 DIAGNOSIS — Z6841 Body Mass Index (BMI) 40.0 and over, adult: Secondary | ICD-10-CM

## 2024-01-24 DIAGNOSIS — G40909 Epilepsy, unspecified, not intractable, without status epilepticus: Secondary | ICD-10-CM | POA: Diagnosis not present

## 2024-01-24 DIAGNOSIS — Z363 Encounter for antenatal screening for malformations: Secondary | ICD-10-CM | POA: Insufficient documentation

## 2024-01-24 DIAGNOSIS — Z7951 Long term (current) use of inhaled steroids: Secondary | ICD-10-CM | POA: Insufficient documentation

## 2024-01-24 DIAGNOSIS — Z3A20 20 weeks gestation of pregnancy: Secondary | ICD-10-CM

## 2024-01-24 NOTE — Progress Notes (Signed)
 "  Patient information  Patient Name: Latoya Stark  Patient MRN:   [id number]  Referring practice: [facility]  Problem List   Patient Active Problem List   Diagnosis Date Noted   Vertigo 12/20/2023   Supervision of high risk pregnancy, antepartum 11/05/2023   Numbness of hand 10/24/2023   Chronic midline low back pain with bilateral sciatica 08/23/2023   Class 3 severe obesity due to excess calories with serious comorbidity and body mass index (BMI) of 50.0 to 59.9 in adult (HCC) 02/15/2023   Mild persistent asthma without complication 11/24/2022   Anemia 11/24/2022   Mild episode of recurrent major depressive disorder 11/24/2022   Anxiety 11/24/2022   Lipoma of neck 11/16/2022   Dysphagia 11/10/2022   Chronic migraine without aura without status migrainosus, not intractable 06/22/2020   BMI 50.0-59.9, adult (HCC) 02/20/2019   Conversion disorder 02/10/2019   History of seizure 07/30/2018   Maternal Fetal Medicine Consult KANDYCE DIEGUEZ is a 29 y.o. [id number] at [redacted]w[redacted]d here for ultrasound and consultation. She had low risk aneuploidy screening of a female fetus. Carrier screening was Negative for the basic screening (SMA, alpha-thal, beta-thal, and cystic fibroisis. Maternal serum AFP was negative. She has no acute concerns.   Today we focused on the following:   The patient is here today for a detailed fetal anatomy ultrasound. 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 The patient reports additional medical comorbidities that were not fully reviewed today due to time constraints and will be addressed at future  visits. The fetal anatomy evaluation today is limited due to poor acoustic windows related to maternal body habitus, with multiple fetal structures suboptimally visualized; no major anomalies are identified in the visualized anatomy, and a repeat anatomy ultrasound is recommended. We reviewed the pregnancy-related implications of each of her comorbid conditions. Regarding her seizure disorder, I counseled the patient to continue her antiseizure medications and maintain close follow-up with her neurologist, to report any new seizure-like activity or concerning neurologic symptoms, and to obtain serial antiseizure medication levels during pregnancy. We discussed that maintaining good seizure control with medication exposure during pregnancy is safer for both maternal and fetal outcomes than uncontrolled seizures, given the increased risks of fetal hypoxia, stillbirth, trauma, and adverse maternal outcomes associated with recurrent seizures. Regarding obesity, we discussed increased risks including gestational diabetes, hypertensive disorders, fetal growth abnormalities, and cesarean delivery, and reviewed recommendations for early glucose screening if not previously completed, dietary modification, regular exercise, serial growth ultrasounds, and antenatal testing later in pregnancy. Regarding asthma, the patient was counseled to avoid known triggers, continue albuterol  as needed, and to report symptoms suggestive of poor control, including rescue inhaler use more than twice per week or nocturnal symptoms. Regarding her history of recurrent pregnancy loss, we discussed that a comprehensive workup could be considered at some point, including additional laboratory testing, imaging, and genetic counseling; the patient understands these options and prefers to defer further evaluation at this time. She will return in four weeks for a repeat anatomy ultrasound and will have serial growth ultrasounds throughout the  pregnancy.  RECOMMENDATIONS -Continue levetiracetam  and lamotrigine  throughout pregnancy -Follow up with neurology and obtain serial antiseizure medication levels -Report any new seizure activity or neurologic  symptoms promptly -Early glucose screening if not previously completed -Healthy diet with carbohydrate moderation and regular exercise as tolerated -Serial growth ultrasounds during pregnancy -Antenatal testing weekly in the third trimester -Avoid asthma triggers and continue albuterol  as needed -Notify provider if rescue inhaler use exceeds twice weekly or occurs nightly -Return in four weeks for repeat anatomy ultrasound due to limited visualization  60 minutes of time was spent reviewing the patient's chart including labs, imaging and documentation.  At least 50% of this time was spent with direct patient care discussing the diagnosis, management and prognosis of her care.  Review of Systems: A review of systems was performed and was negative except per HPI   Past Obstetrical History:  OB History  Gravida Para Term Preterm AB Living  9 2 2  6 2   SAB IAB Ectopic Multiple Live Births  4 1  0 2    # Outcome Date GA Lbr Len/2nd Weight Sex Type Anes PTL Lv  9 Current           8 Term 06/22/21 [redacted]w[redacted]d 03:19 / 00:04 7 lb 0.5 oz (3.19 kg) M Vag-Spont EPI  LIV  7 AB 10/08/19 [redacted]w[redacted]d    SAB     6 Term 03/01/19 [redacted]w[redacted]d / 00:07 7 lb 9.9 oz (3.455 kg) M Vag-Spont EPI  LIV  5 SAB 10/10/14          4 SAB           3 IAB           2 SAB           1 SAB              Past Medical History:  Past Medical History:  Diagnosis Date   ADHD (attention deficit hyperactivity disorder)    Anemia    Anxiety    Asthma    Back pain    Chronic migraine 08/01/2018   Constipation    Depression    Epilepsy (HCC)    Family history of adverse reaction to anesthesia    maternal grandmother stopped breathing during surgery 2012   GERD (gastroesophageal reflux disease)    Gestational diabetes     Gestational diabetes mellitus (GDM) in third trimester 04/19/2021   Normal PP GTT   Gonorrhea in female 10/05/2013   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   started at age 82   Headache    Obesity    Pneumonia    Postpartum depression, postpartum condition 08/11/2021   Seizures (HCC)    when I get too hot   Trichomonas infection    Umbilical hernia 04/2022   UTI (lower urinary tract infection)    Vulvar abscess 08/11/2021     Past Surgical History:    Past Surgical History:  Procedure Laterality Date   COLONOSCOPY N/A 05/22/2023   Procedure: COLONOSCOPY;  Surgeon: Therisa Bi, MD;  Location: [facility] ENDOSCOPY;  Service: Gastroenterology;  Laterality: N/A;   DILATION AND EVACUATION N/A 08/09/2013   Procedure: DILATATION AND EVACUATION;  Surgeon: Aida DELENA Na, MD;  Location: [facility] ORS;  Service: Gynecology;  Laterality: N/A;   DILATION AND EVACUATION  2017   ESOPHAGOGASTRODUODENOSCOPY (EGD) WITH PROPOFOL  N/A 11/10/2022   Procedure: ESOPHAGOGASTRODUODENOSCOPY (EGD) WITH PROPOFOL ;  Surgeon: Therisa Bi, MD;  Location: [facility] ENDOSCOPY;  Service: Gastroenterology;  Laterality: N/A;   EUSTACHIAN TUBE DILATION     EXCISION MASS NECK N/A 11/16/2022   Procedure: EXCISION MASS NECK;  Surgeon: Jordis Laneta FALCON, MD;  Location: [facility] ORS;  Service: General;  Laterality: N/A;  JP drain placed   LACERATION REPAIR Left 2007   5th toe   ORTHOPEDIC SURGERY Left 2010   hand fracture; screws in place   TONSILLECTOMY AND ADENOIDECTOMY  2001   TYMPANOSTOMY TUBE PLACEMENT Bilateral 2002   UMBILICAL HERNIA REPAIR N/A 05/12/2022   Procedure: LAPAROSCOPIC ASSISTED SUPRA UMBILICAL HERNIA REPAIR;  Surgeon: Gladis Cough, MD;  Location: [facility] ORS;  Service: General;  Laterality: N/A;     Home Medications:   Medications Ordered Prior to Encounter[1]    Allergies:   Allergies[2]   Physical Exam:   Vitals:   01/24/24 1019  BP: 125/63  Pulse: (!) 111   Sitting comfortably on the sonogram  table Nonlabored breathing Normal rate and rhythm Abdomen is nontender  Thank you for the opportunity to be involved with this patient's care. Please let us  know if we can be of any further assistance.   Delora Smaller MFM, [place]   01/24/2024  11:20 AM       [1]  Current Outpatient Medications on File Prior to Visit  Medication Sig Dispense Refill   acetaminophen -caffeine  (EXCEDRIN  TENSION HEADACHE) 500-65 MG TABS per tablet Take 2 tablets by mouth every 6 (six) hours as needed (for headache). 60 tablet 2   albuterol  (VENTOLIN  HFA) 108 (90 Base) MCG/ACT inhaler Inhale 1-2 puffs into the lungs every 6 (six) hours as needed for wheezing or shortness of breath. 18 g 11   aspirin  EC 81 MG tablet Take 1 tablet (81 mg total) by mouth daily. Take after 12 weeks for prevention of preeclampsia later in pregnancy 300 tablet 2   busPIRone  (BUSPAR ) 5 MG tablet Take 1 tablet (5 mg total) by mouth 3 (three) times daily. 90 tablet 0   cetirizine  (ZYRTEC ) 10 MG tablet Take 1 tablet (10 mg total) by mouth daily. 30 tablet 0   cyclobenzaprine  (FLEXERIL ) 10 MG tablet Take 1 tablet (10 mg total) by mouth 2 (two) times daily as needed. 30 tablet 0   diphenhydrAMINE  (BENADRYL ) 25 MG tablet Take 1-2 tablets (25-50 mg total) by mouth every 6 (six) hours as needed (for headache or dizziness). 60 tablet 2   Doxylamine -Pyridoxine  (DICLEGIS ) 10-10 MG TBEC Take 1 tablet by mouth See admin instructions. (Diclegis (R)) Initial, 2 tablets (doxylamine  succinate 10 mg/pyridoxine  hydrochloride 10 mg per tablet) orally at bedtime on day 1 and 2; if symptoms persist, take 1 tablet in morning and 2 tablets at bedtime on day 3; if symptoms persist, may increase to MAX 4 tablets per day, administered as 1 tablet in the morning, 1 tablet in mid-afternoon and 2 tablets at bedtime 60 tablet 0   gabapentin  (NEURONTIN ) 100 MG capsule Take 2 capsules (200 mg total) by mouth at bedtime. 180 capsule 0   hydrOXYzine  (VISTARIL ) 25 MG  capsule TAKE 1 CAPSULE (25 MG TOTAL) BY MOUTH AT BEDTIME AS NEEDED FOR ANXIETY. 90 capsule 0   lamoTRIgine  (LAMICTAL ) 200 MG tablet Take 1 tablet (200 mg total) by mouth 3 (three) times daily. 90 tablet 0   levETIRAcetam  (KEPPRA ) 750 MG tablet Take 2 tablets (1,500 mg total) by mouth 2 (two) times daily. 120 tablet 0   meclizine  (ANTIVERT ) 25 MG tablet Take 1 tablet (25 mg total) by mouth 3 (three) times daily as needed for dizziness. 90 tablet 5   metoCLOPramide  (REGLAN ) 10 MG tablet Take 1 tablet (10 mg total) by mouth every 8 (eight) hours as needed (for nausea or headache). 90  tablet 0   ondansetron  (ZOFRAN -ODT) 4 MG disintegrating tablet Take 1 tablet (4 mg total) by mouth every 8 (eight) hours as needed for refractory nausea / vomiting. 20 tablet 0   pantoprazole  (PROTONIX ) 40 MG tablet Take 1 tablet (40 mg total) by mouth 2 (two) times daily before a meal. 60 tablet 5   polyethylene glycol powder (MIRALAX ) 17 GM/SCOOP powder Take 17 g by mouth 2 (two) times daily. Dissolve 1 capful (17g) in 4-8 ounces of liquid and take by mouth daily. 238 g 0   Prenat-FeCbn-FeAsp-Meth-FA-DHA (PRENATE MINI ) 18-0.6-0.4-350 MG CAPS Take 1 capsule by mouth daily before breakfast. 90 capsule 3   sertraline  (ZOLOFT ) 100 MG tablet Take 1 tablet (100 mg total) by mouth daily. 90 tablet 1   SYMBICORT  80-4.5 MCG/ACT inhaler Inhale 2 puffs into the lungs 2 (two) times daily as needed (shortness of breath). 10.2 g 10   Blood Pressure Monitoring (BLOOD PRESSURE KIT) DEVI 1 Device by Does not apply route once a week. 1 each 0   EPINEPHrine  (EPIPEN  2-PAK) 0.3 mg/0.3 mL IJ SOAJ injection Inject 0.3 mg into the muscle once as needed (for severe allergic reaction). CAll 911 immediately if you have to use this medicine 2 each 1   No current facility-administered medications on file prior to visit.  [2]  Allergies Allergen Reactions   Hydrocodone  Hives   Latex Itching and Swelling   Milk-Related Compounds Dermatitis     Breaks face out *can have 2% milk*   Mushroom Extract Complex (Obsolete) Anaphylaxis   Penicillins Anaphylaxis    Has patient had a PCN reaction causing immediate rash, facial/tongue/throat swelling, SOB or lightheadedness with hypotension: Yes  Has patient had a PCN reaction causing severe rash involving mucus membranes or skin necrosis: No  Has patient had a PCN reaction that required hospitalization No  Has patient had a PCN reaction occurring within the last 10 years: Yes  If all of the above answers are NO, then may proceed with Cephalosporin use.  Has patient had a PCN reaction causing immediate rash, facial/tongue/throat swelling, SOB or lightheadedness with hypotension: Yes Has patient had a PCN reaction causing severe rash involving mucus membranes or skin necrosis: No Has patient had a PCN reaction that required hospitalization No Has patient had a PCN reaction occurring within the last 10 years: Yes If all of the above answers are NO, then may proceed with Cephalosporin use.    Has patient had a PCN reaction causing immediate rash, facial/tongue/throat swelling, SOB or lightheadedness with hypotension: Yes Has patient had a PCN reaction causing severe rash involving mucus membranes or skin necrosis: No Has patient had a PCN reaction that required hospitalization No Has patient had a PCN reaction occurring within the last 10 years: Yes If all of the above answers are NO, then may proceed with Cephalosporin use.   Cheese Other (See Comments)    Breaks face out   Sulfa Antibiotics Swelling   "

## 2024-01-28 ENCOUNTER — Ambulatory Visit: Admitting: Surgery

## 2024-01-28 ENCOUNTER — Other Ambulatory Visit: Payer: Self-pay

## 2024-01-29 ENCOUNTER — Encounter: Payer: Self-pay | Admitting: Obstetrics

## 2024-01-29 ENCOUNTER — Ambulatory Visit (INDEPENDENT_AMBULATORY_CARE_PROVIDER_SITE_OTHER): Payer: Self-pay | Admitting: Obstetrics

## 2024-01-29 VITALS — BP 137/88 | HR 98 | Wt 335.7 lb

## 2024-01-29 DIAGNOSIS — Z87898 Personal history of other specified conditions: Secondary | ICD-10-CM

## 2024-01-29 DIAGNOSIS — O0992 Supervision of high risk pregnancy, unspecified, second trimester: Secondary | ICD-10-CM | POA: Diagnosis not present

## 2024-01-29 DIAGNOSIS — O99342 Other mental disorders complicating pregnancy, second trimester: Secondary | ICD-10-CM

## 2024-01-29 DIAGNOSIS — R42 Dizziness and giddiness: Secondary | ICD-10-CM | POA: Diagnosis not present

## 2024-01-29 DIAGNOSIS — O99210 Obesity complicating pregnancy, unspecified trimester: Secondary | ICD-10-CM

## 2024-01-29 DIAGNOSIS — D508 Other iron deficiency anemias: Secondary | ICD-10-CM | POA: Diagnosis not present

## 2024-01-29 DIAGNOSIS — G43709 Chronic migraine without aura, not intractable, without status migrainosus: Secondary | ICD-10-CM | POA: Diagnosis not present

## 2024-01-29 DIAGNOSIS — O99212 Obesity complicating pregnancy, second trimester: Secondary | ICD-10-CM

## 2024-01-29 DIAGNOSIS — Z3A21 21 weeks gestation of pregnancy: Secondary | ICD-10-CM | POA: Diagnosis not present

## 2024-01-29 DIAGNOSIS — O0990 Supervision of high risk pregnancy, unspecified, unspecified trimester: Secondary | ICD-10-CM

## 2024-01-29 DIAGNOSIS — F330 Major depressive disorder, recurrent, mild: Secondary | ICD-10-CM

## 2024-01-29 NOTE — Progress Notes (Signed)
 Subjective:  Latoya Stark is a 29 y.o. H0E7937 at [redacted]w[redacted]d being seen today for ongoing prenatal care.  She is currently monitored for the following issues for this high-risk pregnancy and has History of seizure; Conversion disorder; BMI 50.0-59.9, adult (HCC); Chronic migraine without aura without status migrainosus, not intractable; Dysphagia; Lipoma of neck; Mild persistent asthma without complication; Anemia; Mild episode of recurrent major depressive disorder; Anxiety; Class 3 severe obesity due to excess calories with serious comorbidity and body mass index (BMI) of 50.0 to 59.9 in adult [facility]); Chronic midline low back pain with bilateral sciatica; Supervision of high risk pregnancy, antepartum; Numbness of hand; and Vertigo on their problem list.  Patient reports heartburn.  Contractions: Irritability. Vag. Bleeding: None.  Movement: Present. Denies leaking of fluid.   The following portions of the patient's history were reviewed and updated as appropriate: allergies, current medications, past family history, past medical history, past social history, past surgical history and problem list. Problem list updated.  Objective:   Vitals:   01/29/24 1137 01/29/24 1143  BP: (!) 138/90 137/88  Pulse: 99 98  Weight: (!) 335 lb 11.2 oz (152.3 kg)     Fetal Status:     Movement: Present     General:  Alert, oriented and cooperative. Patient is in no acute distress.  Skin: Skin is warm and dry. No rash noted.   Cardiovascular: Normal heart rate noted  Respiratory: Normal respiratory effort, no problems with respiration noted  Abdomen: Soft, gravid, appropriate for gestational age. Pain/Pressure: Present     Pelvic:  Cervical exam deferred        Extremities: Normal range of motion.  Edema: Trace (Feet)  Mental Status: Normal mood and affect. Normal behavior. Normal judgment and thought content.   Urinalysis:      Assessment and Plan:  Pregnancy: [id number] at [redacted]w[redacted]d  1. Supervision of high  risk pregnancy, antepartum (Primary)  2. History of seizure - clinically stable  3. Iron deficiency anemia secondary to inadequate dietary iron intake - taking PNV  4. Vertigo - clinically stable  5. Chronic migraine without aura without status migrainosus, not intractable - clinically stable  6. Mild episode of recurrent major depressive disorder - clinically stable  7. Obesity affecting pregnancy, antepartum, unspecified obesity type    Preterm labor symptoms and general obstetric precautions including but not limited to vaginal bleeding, contractions, leaking of fluid and fetal movement were reviewed in detail with the patient. Please refer to After Visit Summary for other counseling recommendations.   Return in about 4 weeks (around 02/26/2024) for [facility].   Rudy Carlin LABOR, MD 01/29/2024

## 2024-01-29 NOTE — Progress Notes (Signed)
 Pt presents for rob. Pt has no questions or concerns at this time.

## 2024-01-29 NOTE — Telephone Encounter (Signed)
 Patient called in asking to reschedule her HST appointment that was scheduled for 1/21

## 2024-01-30 ENCOUNTER — Encounter

## 2024-01-31 ENCOUNTER — Telehealth: Payer: Self-pay | Admitting: Family Medicine

## 2024-01-31 NOTE — Telephone Encounter (Signed)
 Patient reschedule appointment due to potential bad weather

## 2024-02-05 ENCOUNTER — Ambulatory Visit: Admitting: Family Medicine

## 2024-02-06 ENCOUNTER — Ambulatory Visit: Admitting: Surgery

## 2024-02-06 ENCOUNTER — Other Ambulatory Visit: Payer: Self-pay

## 2024-02-06 MED FILL — Cetirizine HCl Tab 10 MG: ORAL | 30 days supply | Qty: 30 | Fill #0 | Status: AC

## 2024-02-07 ENCOUNTER — Other Ambulatory Visit: Payer: Self-pay

## 2024-02-07 ENCOUNTER — Ambulatory Visit (HOSPITAL_COMMUNITY)
Admission: RE | Admit: 2024-02-07 | Discharge: 2024-02-07 | Disposition: A | Discharge status: Home / Self Care | Source: Ambulatory Visit | Attending: Cardiology | Admitting: Cardiology

## 2024-02-07 DIAGNOSIS — R0602 Shortness of breath: Secondary | ICD-10-CM | POA: Diagnosis not present

## 2024-02-07 LAB — ECHOCARDIOGRAM COMPLETE
Area-P 1/2: 7.9 cm2
S' Lateral: 2.8 cm

## 2024-02-08 ENCOUNTER — Other Ambulatory Visit: Payer: Self-pay

## 2024-02-11 ENCOUNTER — Ambulatory Visit: Payer: Self-pay | Admitting: Cardiology

## 2024-02-12 ENCOUNTER — Ambulatory Visit

## 2024-02-14 ENCOUNTER — Ambulatory Visit (INDEPENDENT_AMBULATORY_CARE_PROVIDER_SITE_OTHER): Admitting: Physician Assistant

## 2024-02-14 ENCOUNTER — Encounter (INDEPENDENT_AMBULATORY_CARE_PROVIDER_SITE_OTHER): Payer: Self-pay | Admitting: Physician Assistant

## 2024-02-14 VITALS — BP 104/70 | HR 104 | Temp 97.7°F | Ht 65.0 in | Wt 325.0 lb

## 2024-02-14 DIAGNOSIS — R42 Dizziness and giddiness: Secondary | ICD-10-CM

## 2024-02-14 NOTE — Progress Notes (Addendum)
 Dear Dr. Wallace, Here is my assessment for our mutual patient, Latoya Stark. Thank you for allowing me the opportunity to care for your patient. Please do not hesitate to contact me should you have any other questions. Sincerely, Chyrl Cohen PA-C  Otolaryngology Clinic Note Referring provider: Dr. Wallace HPI:  Latoya Stark is a 29 y.o. female kindly referred by Dr. Wallace   Discussed the use of AI scribe software for clinical note transcription with the patient, who gave verbal consent to proceed.  History of Present Illness   Latoya Stark is a 29 year old female with epilepsy, chronic migraines, and prior tympanostomy tubes who presents with dizziness and vertigo.  Dizziness and vertigo began several months ago, characterized by intermittent episodes of lightheadedness, presyncope, and severe disequilibrium. Symptoms are frequently triggered by visual stimuli, including moving objects in her visual field, scrolling on a phone, or exposure to flashing images. Rapid head movements and postural transitions also provoke symptoms. She describes episodes of near-fall and presyncope, occasionally requiring her to stop ambulating. One severe episode prompted emergency department evaluation due to fear of falling and emotional distress.  Episodes occur intermittently, sometimes daily, but are not continuous throughout the day. She is currently five months pregnant. Associated symptoms during episodes include paresthesias in her hands and feet and occasional numbness in her leg, which resolve as the dizziness abates. She experiences headaches during these episodes, described as more severe than her baseline migraines, with pain so intense she is unable to move or open her eyes. She recalls prior episodes of vertigo dating back to at least 2020, though details are unclear.  Previous evaluation included a normal EEG performed by neurology prior to the onset of dizziness and a normal cardiac  workup. Benadryl  prescribed in the emergency department did not provide symptom relief.  She has a history of recurrent childhood otitis media requiring tympanostomy tube placement twice and adenoidectomy. She denies current otalgia associated with dizziness.           Independent Review of Additional Tests or Records:  none   PMH/Meds/All/SocHx/FamHx/ROS:   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   Headache    Obesity    Pneumonia    Postpartum depression, postpartum condition 08/11/2021   Seizures (HCC)    when I get too hot   Trichomonas infection    Umbilical hernia 04/2022   UTI (lower urinary tract infection)    Vulvar abscess 08/11/2021     Past Surgical History:  Procedure Laterality Date   COLONOSCOPY N/A 05/22/2023   Procedure: COLONOSCOPY;  Surgeon: Therisa Bi, MD;  Location: [facility] ENDOSCOPY;  Service: Gastroenterology;  Laterality: N/A;   DILATION AND EVACUATION N/A 08/09/2013   Procedure: DILATATION AND EVACUATION;  Surgeon: Aida DELENA Na, MD;  Location: [facility] ORS;  Service: Gynecology;  Laterality: N/A;   DILATION AND EVACUATION  2017   ESOPHAGOGASTRODUODENOSCOPY (EGD) WITH PROPOFOL  N/A 11/10/2022   Procedure: ESOPHAGOGASTRODUODENOSCOPY (EGD) WITH PROPOFOL ;  Surgeon: Therisa Bi, MD;  Location: [facility] ENDOSCOPY;  Service: Gastroenterology;  Laterality: N/A;   EUSTACHIAN TUBE DILATION     EXCISION MASS NECK N/A 11/16/2022    Procedure: EXCISION MASS NECK;  Surgeon: Jordis Laneta FALCON, MD;  Location: [facility] ORS;  Service: General;  Laterality: N/A;  JP drain placed   LACERATION REPAIR Left 2007   5th toe   ORTHOPEDIC SURGERY Left 2010   hand fracture; screws in place   TONSILLECTOMY AND ADENOIDECTOMY  2001   TYMPANOSTOMY TUBE PLACEMENT Bilateral 2002   UMBILICAL HERNIA REPAIR N/A 05/12/2022   Procedure: LAPAROSCOPIC ASSISTED SUPRA UMBILICAL HERNIA REPAIR;  Surgeon: Gladis Cough, MD;  Location: [facility] ORS;  Service: General;  Laterality: N/A;    Family History  Problem Relation Age of Onset   Heart disease Mother    Depression Mother    Cancer Mother        Bone cancer   Seizures Mother    Stroke Mother    Sickle cell anemia Mother    Hyperlipidemia Father    Heart disease Father    Hypertension Father    Sleep apnea Father    Seizures Maternal Aunt    Diabetes Maternal Grandmother    Colon cancer Maternal Grandmother    Hypertension Maternal Grandmother    Clotting disorder Maternal Grandmother    Breast cancer Maternal Grandmother    Stroke Maternal Grandmother    Diabetes Paternal Grandmother      Social Connections: Patient Declined (01/12/2024)   Social Connection and Isolation Panel    Frequency of Communication with Friends and Family: Patient declined    Frequency of Social Gatherings with Friends and Family: Patient declined    Attends Religious Services: Patient declined    Database Administrator or Organizations: Patient declined    Attends Engineer, Structural: Patient declined    Marital Status: Patient declined     Current Medications[1]   Physical Exam:   BP 104/70   Pulse (!) 104   Temp 97.7 F (36.5 C)   Ht 5' 5 (1.651 m)   Wt (!) 325 lb (147.4 kg)   LMP 09/03/2023 (Approximate)   SpO2 96%   BMI 54.08 kg/m   Pertinent Findings  CN II-XII grossly intact- no nystagmus  Bilateral EAC clear and TM intact with well pneumatized middle ear spaces Anterior rhinoscopy:  Septum midline; bilateral inferior turbinates with no hypertrophy No lesions of oral cavity/oropharynx; dentition WNL No obviously palpable neck masses/lymphadenopathy/thyromegaly No respiratory distress or stridor       Seprately Identifiable Procedures:  None  Impression & Plans:  Carleta Woodrow is a 29 y.o. female with the following   Assessment and Plan     Dizziness-  29 year old female seen in our office for evaluation of dizziness.  Unclear distinct etiology of her dizziness as she has multiple symptoms.  I have low suspicion for any ear related disease that would be contributing to her dizziness although I cannot completely rule this out.  I would like audiological evaluation for further assessment.  She describes episodes of near syncope not consistent with ear disease, she also reports some neurologic symptoms as well as headache.  I do think she would be served best by following up with her neurologist for evaluation of her migraines in relation to potential vestibular migraine as well as continued outpatient management by her primary care team  for the near syncopal episodes of dizziness.  I am happy to see her back in the office for further assessment at any point.  I will discuss the audiological results once available.      - f/u phone office visit with audiological results   Thank you for allowing me the opportunity to care for your patient. Please do not hesitate to contact me should you have any other questions.  Sincerely, Chyrl Cohen PA-C  [facility] Phone: [phone] Fax: [phone]  02/14/2024, 2:54 PM         [1]  Current Outpatient Medications:    acetaminophen -caffeine  (EXCEDRIN  TENSION HEADACHE) 500-65 MG TABS per tablet, Take 2 tablets by mouth every 6 (six) hours as needed (for headache)., Disp: 60 tablet, Rfl: 2   albuterol  (VENTOLIN  HFA) 108 (90 Base) MCG/ACT inhaler, Inhale 1-2 puffs into the lungs every 6 (six) hours as needed  for wheezing or shortness of breath., Disp: 18 g, Rfl: 11   aspirin  EC 81 MG tablet, Take 1 tablet (81 mg total) by mouth daily. Take after 12 weeks for prevention of preeclampsia later in pregnancy, Disp: 300 tablet, Rfl: 2   Blood Pressure Monitoring (BLOOD PRESSURE KIT) DEVI, 1 Device by Does not apply route once a week., Disp: 1 each, Rfl: 0   busPIRone  (BUSPAR ) 5 MG tablet, Take 1 tablet (5 mg total) by mouth 3 (three) times daily., Disp: 90 tablet, Rfl: 0   cetirizine  (ZYRTEC ) 10 MG tablet, Take 1 tablet (10 mg total) by mouth daily., Disp: 30 tablet, Rfl: 0   cyclobenzaprine  (FLEXERIL ) 10 MG tablet, Take 1 tablet (10 mg total) by mouth 2 (two) times daily as needed., Disp: 30 tablet, Rfl: 0   diphenhydrAMINE  (BENADRYL ) 25 MG tablet, Take 1-2 tablets (25-50 mg total) by mouth every 6 (six) hours as needed (for headache or dizziness)., Disp: 60 tablet, Rfl: 2   Doxylamine -Pyridoxine  (DICLEGIS ) 10-10 MG TBEC, Take 1 tablet by mouth See admin instructions. (Diclegis (R)) Initial, 2 tablets (doxylamine  succinate 10 mg/pyridoxine  hydrochloride 10 mg per tablet) orally at bedtime on day 1 and 2; if symptoms persist, take 1 tablet in morning and 2 tablets at bedtime on day 3; if symptoms persist, may increase to MAX 4 tablets per day, administered as 1 tablet in the morning, 1 tablet in mid-afternoon and 2 tablets at bedtime, Disp: 60 tablet, Rfl: 0   EPINEPHrine  (EPIPEN  2-PAK) 0.3 mg/0.3 mL IJ SOAJ injection, Inject 0.3 mg into the muscle once as needed (for severe allergic reaction). CAll 911 immediately if you have to use this medicine, Disp: 2 each, Rfl: 1   gabapentin  (NEURONTIN ) 100 MG capsule, Take 2 capsules (200 mg total) by mouth at bedtime., Disp: 180 capsule, Rfl: 0   hydrOXYzine  (VISTARIL ) 25 MG capsule, TAKE 1 CAPSULE (25 MG TOTAL) BY MOUTH AT BEDTIME AS NEEDED FOR ANXIETY., Disp: 90 capsule, Rfl: 0   lamoTRIgine  (LAMICTAL ) 200 MG tablet, Take 1 tablet (200 mg total) by mouth 3 (three) times  daily., Disp: 90 tablet, Rfl: 0   levETIRAcetam  (KEPPRA ) 750 MG tablet, Take 2 tablets (1,500 mg total) by mouth 2 (two) times daily., Disp: 120 tablet, Rfl: 0   meclizine  (ANTIVERT ) 25 MG tablet, Take 1 tablet (25 mg total) by mouth 3 (three) times daily as needed for dizziness., Disp: 90 tablet, Rfl: 5   metoCLOPramide  (REGLAN ) 10 MG tablet, Take 1 tablet (10 mg total) by mouth every 8 (eight) hours as needed (for nausea or headache)., Disp: 90 tablet,  Rfl: 0   ondansetron  (ZOFRAN -ODT) 4 MG disintegrating tablet, Take 1 tablet (4 mg total) by mouth every 8 (eight) hours as needed for refractory nausea / vomiting., Disp: 20 tablet, Rfl: 0   pantoprazole  (PROTONIX ) 40 MG tablet, Take 1 tablet (40 mg total) by mouth 2 (two) times daily before a meal., Disp: 60 tablet, Rfl: 5   polyethylene glycol powder (MIRALAX ) 17 GM/SCOOP powder, Take 17 g by mouth 2 (two) times daily. Dissolve 1 capful (17g) in 4-8 ounces of liquid and take by mouth daily., Disp: 238 g, Rfl: 0   Prenat-FeCbn-FeAsp-Meth-FA-DHA (PRENATE MINI ) 18-0.6-0.4-350 MG CAPS, Take 1 capsule by mouth daily before breakfast., Disp: 90 capsule, Rfl: 3   sertraline  (ZOLOFT ) 100 MG tablet, Take 1 tablet (100 mg total) by mouth daily., Disp: 90 tablet, Rfl: 1   SYMBICORT  80-4.5 MCG/ACT inhaler, Inhale 2 puffs into the lungs 2 (two) times daily as needed (shortness of breath)., Disp: 10.2 g, Rfl: 10

## 2024-02-15 ENCOUNTER — Ambulatory Visit (INDEPENDENT_AMBULATORY_CARE_PROVIDER_SITE_OTHER): Admitting: Audiology

## 2024-02-15 DIAGNOSIS — Z0110 Encounter for examination of ears and hearing without abnormal findings: Secondary | ICD-10-CM

## 2024-02-15 DIAGNOSIS — R42 Dizziness and giddiness: Secondary | ICD-10-CM

## 2024-02-15 NOTE — Progress Notes (Signed)
" °  [address] [place], [place] [address] [phone]  Audiological Evaluation    Name: Latoya Stark     DOB:   04/02/1995      MRN:   [id number]                                                                                     Service Date: 02/15/2024     Accompanied by: self     Patient comes today after Reyes Cohen, PA-C sent a referral for a hearing evaluation due to concerns with dizziness.   Symptoms Yes Details  Hearing loss  []    Tinnitus  [x]  Both ears,high pitched  Ear pain/ infections/pressure  [x]  Left ear mainly pain, sometimes right feels plugged up  Balance problems  [x]  Lightheaded and spinning, getting worse and more often with current pregnancy (4th pregnancy). Triggers include scrolling fast on her phone and looking at things that are moving.  Noise exposure history  [x]  Used to work at [employer].Used hearing protection.  Previous ear surgeries  [x]  PE-tubes as a child  Family history of hearing loss  [x]  Cousin on father's side was reportedly born Deaf  Amplification  []    Other  [x]  Chronic migraines     Otoscopy: Right ear: clear external ear canal and notable landmarks visualized on the tympanic membrane. Left ear:  abnormal eardrum appearance.  Tympanometry: Right ear: Type A - Normal external ear canal volume with normal middle ear pressure and normal tympanic membrane compliance. Findings are consistent with normal middle ear function. Left ear: Type A - Normal external ear canal volume with normal middle ear pressure and normal tympanic membrane compliance. Findings are consistent with normal middle ear function.   Hearing Evaluation The hearing test results were completed under headphones and results are deemed to be of good reliability. Test technique:  conventional    Pure tone Audiometry: Both ears - Normal hearing from [phone].   Speech Audiometry: Right ear- Speech Reception Threshold (SRT) was obtained at 15  dBHL. Left ear-Speech Reception Threshold (SRT) was obtained at 15 dBHL.   Word Recognition Score Tested using NU-6 (recorded) Right ear: 100% was obtained at a presentation level of 50 dBHL with contralateral masking which is deemed as  excellent. Left ear: 100% was obtained at a presentation level of 50 dBHL with contralateral masking which is deemed as  excellent.   Impression: There is not a significant difference in pure-tone thresholds between ears. There is not a significant difference in the word recognition score in between ears.    Recommendations: Follow up with ENT as per MD.  Return for a hearing evaluation if concerns with hearing changes arise or per MD recommendation.   Latoya Stark, AUD  "

## 2024-02-22 ENCOUNTER — Ambulatory Visit: Admitting: Cardiology

## 2024-02-22 ENCOUNTER — Encounter: Admitting: Neurology

## 2024-02-27 ENCOUNTER — Encounter: Payer: Self-pay | Admitting: Obstetrics and Gynecology

## 2024-02-27 ENCOUNTER — Encounter: Admitting: Obstetrics & Gynecology

## 2024-03-06 ENCOUNTER — Other Ambulatory Visit

## 2024-03-06 ENCOUNTER — Ambulatory Visit

## 2024-03-11 ENCOUNTER — Ambulatory Visit

## 2024-07-23 ENCOUNTER — Inpatient Hospital Stay: Payer: Self-pay

## 2024-07-23 ENCOUNTER — Inpatient Hospital Stay: Payer: Self-pay | Admitting: Hematology and Oncology

## 2024-12-23 ENCOUNTER — Ambulatory Visit: Admitting: Family Medicine
